# Patient Record
Sex: Female | Born: 1970
Health system: Southern US, Community
[De-identification: ages and names within clinical notes are randomized; demographics above are authoritative.]

## PROBLEM LIST (undated history)

## (undated) DIAGNOSIS — E039 Hypothyroidism, unspecified: Secondary | ICD-10-CM

## (undated) DIAGNOSIS — R319 Hematuria, unspecified: Secondary | ICD-10-CM

## (undated) DIAGNOSIS — F419 Anxiety disorder, unspecified: Secondary | ICD-10-CM

## (undated) DIAGNOSIS — K7400 Hepatic fibrosis, unspecified: Secondary | ICD-10-CM

## (undated) DIAGNOSIS — K759 Inflammatory liver disease, unspecified: Secondary | ICD-10-CM

## (undated) DIAGNOSIS — F32A Depression, unspecified: Secondary | ICD-10-CM

## (undated) DIAGNOSIS — B181 Chronic viral hepatitis B without delta-agent: Secondary | ICD-10-CM

## (undated) DIAGNOSIS — F209 Schizophrenia, unspecified: Secondary | ICD-10-CM

## (undated) DIAGNOSIS — Z975 Presence of (intrauterine) contraceptive device: Secondary | ICD-10-CM

## (undated) DIAGNOSIS — F639 Impulse disorder, unspecified: Secondary | ICD-10-CM

## (undated) DIAGNOSIS — F70 Mild intellectual disabilities: Secondary | ICD-10-CM

## (undated) DIAGNOSIS — A159 Respiratory tuberculosis unspecified: Secondary | ICD-10-CM

## (undated) DIAGNOSIS — R350 Frequency of micturition: Secondary | ICD-10-CM

## (undated) HISTORY — DX: Respiratory tuberculosis unspecified: A15.9

## (undated) HISTORY — DX: Schizophrenia, unspecified: F20.9

## (undated) HISTORY — PX: TRACHEAL SURGERY: SHX1096

## (undated) HISTORY — DX: Hypothyroidism, unspecified: E03.9

## (undated) HISTORY — DX: Presence of (intrauterine) contraceptive device: Z97.5

## (undated) HISTORY — PX: ARM WOUND REPAIR / CLOSURE: SUR1141

## (undated) HISTORY — DX: Mild intellectual disabilities: F70

## (undated) HISTORY — DX: Frequency of micturition: R35.0

## (undated) HISTORY — PX: TRACHEOSTOMY: SUR1362

## (undated) HISTORY — DX: Chronic viral hepatitis B without delta-agent: B18.1

## (undated) HISTORY — DX: Impulse disorder, unspecified: F63.9

## (undated) HISTORY — DX: Hematuria, unspecified: R31.9

---

## 2011-02-28 ENCOUNTER — Emergency Department: Payer: Self-pay | Admitting: Emergency Medicine

## 2011-04-13 ENCOUNTER — Emergency Department (HOSPITAL_COMMUNITY)
Admission: EM | Admit: 2011-04-13 | Discharge: 2011-04-13 | Disposition: A | Discharge status: Home / Self Care | Payer: Medicare Other | Attending: Internal Medicine | Admitting: Internal Medicine

## 2011-04-13 DIAGNOSIS — F209 Schizophrenia, unspecified: Secondary | ICD-10-CM

## 2011-04-13 DIAGNOSIS — E876 Hypokalemia: Secondary | ICD-10-CM | POA: Insufficient documentation

## 2011-04-13 DIAGNOSIS — Z8659 Personal history of other mental and behavioral disorders: Secondary | ICD-10-CM | POA: Insufficient documentation

## 2011-04-13 DIAGNOSIS — Z046 Encounter for general psychiatric examination, requested by authority: Secondary | ICD-10-CM | POA: Insufficient documentation

## 2011-04-13 DIAGNOSIS — F319 Bipolar disorder, unspecified: Secondary | ICD-10-CM | POA: Insufficient documentation

## 2011-04-13 LAB — COMPREHENSIVE METABOLIC PANEL
ALT: 10 U/L (ref 0–35)
Albumin: 4 g/dL (ref 3.5–5.2)
Alkaline Phosphatase: 62 U/L (ref 39–117)
Chloride: 100 mEq/L (ref 96–112)
Glucose, Bld: 168 mg/dL — ABNORMAL HIGH (ref 70–99)
Potassium: 3.4 mEq/L — ABNORMAL LOW (ref 3.5–5.1)
Sodium: 137 mEq/L (ref 135–145)
Total Bilirubin: 0.3 mg/dL (ref 0.3–1.2)
Total Protein: 6.9 g/dL (ref 6.0–8.3)

## 2011-04-13 LAB — URINALYSIS, ROUTINE W REFLEX MICROSCOPIC
Ketones, ur: NEGATIVE mg/dL
Nitrite: NEGATIVE
Protein, ur: NEGATIVE mg/dL
Urobilinogen, UA: 0.2 mg/dL (ref 0.0–1.0)

## 2011-04-13 LAB — CBC
Hemoglobin: 13.5 g/dL (ref 12.0–15.0)
MCHC: 33.9 g/dL (ref 30.0–36.0)
RDW: 11.9 % (ref 11.5–15.5)
WBC: 12.8 10*3/uL — ABNORMAL HIGH (ref 4.0–10.5)

## 2011-04-13 LAB — RAPID URINE DRUG SCREEN, HOSP PERFORMED
Amphetamines: NOT DETECTED
Barbiturates: NOT DETECTED
Benzodiazepines: NOT DETECTED

## 2011-04-13 LAB — URINE MICROSCOPIC-ADD ON

## 2011-04-13 NOTE — ED Notes (Signed)
Pt arrives via [employer], pt from homeless shelter, pt recently released from [facility] in [place], brought to [facility] for medical clearance, pt denies SI/HI, pt hx schizophrenia, bipolar disorder

## 2011-04-13 NOTE — ED Notes (Signed)
Meal tray provided.

## 2011-04-13 NOTE — ED Notes (Signed)
Fluids provided to pt.

## 2011-04-13 NOTE — Discharge Summary (Signed)
CSW spoke with Mr. Margaret [name], patient's CM and he has found a group home facility for her. CM advised he will be here by 6pm to pick up patient.  Patient's nurse and EDP notified.  Laurena Spies , MSW, LCSWA 04/13/2011  4:25 PM [phone]

## 2011-04-13 NOTE — ED Notes (Signed)
Pt discharged in care of caseworker from [place].

## 2011-04-13 NOTE — ED Notes (Signed)
Social worker paged and stated she was working with the case manager finding placement for the patient since she does not meet admission criteria in psych or med.  Tb skin test requested from social worker

## 2011-04-13 NOTE — ED Notes (Signed)
Case worker on scene to transport pt.

## 2011-04-13 NOTE — ED Notes (Signed)
Social worker into see pt.  ua ordered per her request

## 2011-04-13 NOTE — ED Notes (Signed)
Pt taken to tcu and allowed to shower.  Ed Audiological scientist to supervise pt.

## 2011-04-13 NOTE — ED Notes (Signed)
Pt remains on unit, denies needs, cont to monitor

## 2011-04-13 NOTE — ED Notes (Signed)
Pt changed into blue scrubs, belongings placed in one belonging bag. Security searched pt and bag.

## 2011-04-13 NOTE — ED Notes (Signed)
See down time charting for orders

## 2011-04-13 NOTE — ED Notes (Signed)
Spoke with case Manager from [place], states pt is homeless, released from [facility] [place], will attempt placement

## 2011-04-13 NOTE — ED Notes (Signed)
Pt's belongings at nurses station

## 2011-04-13 NOTE — ED Provider Notes (Signed)
History     CSN: [id number]  Arrival date & time 04/13/11  0031   None     Chief Complaint  Patient presents with  . Medical Clearance    (Consider location/radiation/quality/duration/timing/severity/associated sxs/prior treatment) HPI Comments: 41 year old female with history of schizophrenia and bipolar disorder who was recently [place] [facility] [place], has currently been in a foster home typed living, she left on her own free will and has been trying to get into a homeless shelter. She was brought to the emergency department by [facility] Department for medical clearance. The patient has absolutely no complaints including depression, suicidal thoughts or physical complaints as well. After nurse spoke with the case manager from her last facility they state that they're trying to help place her in a shelter as she has been medically discharged from her last psychiatric admission. Specifically the patient denies headache change in vision, service of breath chest pain back pain belly pain dysuria diarrhea swelling or rashes.  The history is provided by the patient and the nursing home.    No past medical history on file.  No past surgical history on file.  No family history on file.  History  Substance Use Topics  . Smoking status: Not on file  . Smokeless tobacco: Not on file  . Alcohol Use: Not on file    OB History    No data available      Review of Systems  All other systems reviewed and are negative.    Allergies  Review of patient's allergies indicates no known allergies.  Home Medications   Current Outpatient Rx  Name Route Sig Dispense Refill  . CLONAZEPAM 1 MG PO TABS Oral Take 1 mg by mouth 2 (two) times daily as needed.    Marland Kitchen DIVALPROEX SODIUM 500 MG PO TBEC Oral Take 500 mg by mouth 2 (two) times daily.      BP 114/77  Pulse 87  Temp(Src) 98.1 F (36.7 C) (Oral)  Resp 16  SpO2 99%  Physical Exam  Nursing note and vitals  reviewed. Constitutional: She appears well-developed and well-nourished. No distress.  HENT:  Head: Normocephalic and atraumatic.  Mouth/Throat: Oropharynx is clear and moist. No oropharyngeal exudate.  Eyes: Conjunctivae and EOM are normal. Pupils are equal, round, and reactive to light. Right eye exhibits no discharge. Left eye exhibits no discharge. No scleral icterus.  Neck: Normal range of motion. Neck supple. No JVD present. No thyromegaly present.  Cardiovascular: Normal rate, regular rhythm, normal heart sounds and intact distal pulses.  Exam reveals no gallop and no friction rub.   No murmur heard. Pulmonary/Chest: Effort normal and breath sounds normal. No respiratory distress. She has no wheezes. She has no rales.  Abdominal: Soft. Bowel sounds are normal. She exhibits no distension and no mass. There is no tenderness.  Musculoskeletal: Normal range of motion. She exhibits no edema and no tenderness.  Lymphadenopathy:    She has no cervical adenopathy.  Neurological: She is alert. Coordination normal.  Skin: Skin is warm and dry. No rash noted. No erythema.  Psychiatric: She has a normal mood and affect. Her behavior is normal.       Patient happy, denies depression or suicidal thoughts. Denies hallucinations    ED Course  Procedures (including critical care time)  Labs Reviewed  CBC - Abnormal; Notable for the following:    WBC 12.8 (*)    All other components within normal limits  COMPREHENSIVE METABOLIC PANEL - Abnormal; Notable  for the following:    Potassium 3.4 (*)    Glucose, Bld 168 (*)    All other components within normal limits  URINALYSIS, ROUTINE W REFLEX MICROSCOPIC - Abnormal; Notable for the following:    APPearance CLOUDY (*)    Leukocytes, UA SMALL (*)    All other components within normal limits  URINE MICROSCOPIC-ADD ON - Abnormal; Notable for the following:    Bacteria, UA MANY (*)    All other components within normal limits  ETHANOL  URINE  RAPID DRUG SCREEN (HOSP PERFORMED)  URINE RAPID DRUG SCREEN (HOSP PERFORMED)   No results found.   1. Schizophrenia       MDM  Patient is well-appearing, unremarkable vital signs, physical exam is normal. She has no acute need for psychiatric placement, will contact her caseworker or in-house caseworker to help with placement.  Change of shift at 8:00 AM, pt to be d/c with placement - case worker at [facility] is working on placement  Mild hypoK, not needing replacement at this time.       Johnna Acosta, MD 04/13/11 2213

## 2011-04-13 NOTE — Progress Notes (Addendum)
CSW spoke with patient and she reports not being happy at group home [facility]). Patient states she is made fun of by other residents, but denies being physically/sexually abused.  Patient has expressed desire to go to a group home in [place]. When CSW asked patient why [name], patient reports she has heard good things about that area.  Patient denies SI/HI/AVH and is not a threat to herself or others.  CSW advised patient that she will be discharged to group home today and patient did not express any fear or hesitation.  CSW contacted patient's case worker and left message requesting a return call regarding patient's discharge.  Laurena Spies , MSW, LCSWA 04/13/2011 8:57 AM [phone]  CSW spoke with CM from [facility]. He is working on group home placement for facility in [place].  If they are unable to find placement for patient, CSW will assist with ALF placement, however, CM wo uld like to pursue group home first. CM reported they have completed referral to [facility], however, they have not received any followup. CSW contacted [facility] who advised they have not received confirmation or documentation of MR diagnosis, and until that is recvd, are unable to provide services. This information will be passed on to CM.  Laurena Spies , MSW, LCSWA 04/13/2011 10:05 AM [phone]

## 2011-05-28 DIAGNOSIS — Z0100 Encounter for examination of eyes and vision without abnormal findings: Secondary | ICD-10-CM | POA: Diagnosis not present

## 2011-05-28 DIAGNOSIS — J329 Chronic sinusitis, unspecified: Secondary | ICD-10-CM | POA: Diagnosis not present

## 2011-05-28 DIAGNOSIS — B1910 Unspecified viral hepatitis B without hepatic coma: Secondary | ICD-10-CM | POA: Diagnosis not present

## 2011-05-28 DIAGNOSIS — Z930 Tracheostomy status: Secondary | ICD-10-CM | POA: Diagnosis not present

## 2011-05-28 DIAGNOSIS — F09 Unspecified mental disorder due to known physiological condition: Secondary | ICD-10-CM | POA: Diagnosis not present

## 2011-05-28 DIAGNOSIS — K5900 Constipation, unspecified: Secondary | ICD-10-CM | POA: Diagnosis not present

## 2011-05-28 DIAGNOSIS — IMO0002 Reserved for concepts with insufficient information to code with codable children: Secondary | ICD-10-CM | POA: Diagnosis not present

## 2011-05-28 DIAGNOSIS — Z8589 Personal history of malignant neoplasm of other organs and systems: Secondary | ICD-10-CM | POA: Diagnosis not present

## 2011-05-28 DIAGNOSIS — E663 Overweight: Secondary | ICD-10-CM | POA: Diagnosis not present

## 2011-05-28 DIAGNOSIS — J320 Chronic maxillary sinusitis: Secondary | ICD-10-CM | POA: Diagnosis not present

## 2011-05-28 DIAGNOSIS — E739 Lactose intolerance, unspecified: Secondary | ICD-10-CM | POA: Diagnosis not present

## 2011-05-28 DIAGNOSIS — F70 Mild intellectual disabilities: Secondary | ICD-10-CM | POA: Diagnosis not present

## 2011-05-28 DIAGNOSIS — J328 Other chronic sinusitis: Secondary | ICD-10-CM | POA: Diagnosis not present

## 2011-05-28 DIAGNOSIS — F259 Schizoaffective disorder, unspecified: Secondary | ICD-10-CM | POA: Diagnosis not present

## 2011-05-28 DIAGNOSIS — Z0120 Encounter for dental examination and cleaning without abnormal findings: Secondary | ICD-10-CM | POA: Diagnosis not present

## 2011-05-28 DIAGNOSIS — J310 Chronic rhinitis: Secondary | ICD-10-CM | POA: Diagnosis not present

## 2011-05-28 DIAGNOSIS — R569 Unspecified convulsions: Secondary | ICD-10-CM | POA: Diagnosis not present

## 2011-05-28 DIAGNOSIS — J3489 Other specified disorders of nose and nasal sinuses: Secondary | ICD-10-CM | POA: Diagnosis not present

## 2011-05-28 DIAGNOSIS — E039 Hypothyroidism, unspecified: Secondary | ICD-10-CM | POA: Diagnosis not present

## 2011-05-28 DIAGNOSIS — Z79899 Other long term (current) drug therapy: Secondary | ICD-10-CM | POA: Diagnosis not present

## 2011-05-28 DIAGNOSIS — J45909 Unspecified asthma, uncomplicated: Secondary | ICD-10-CM | POA: Diagnosis not present

## 2011-05-28 DIAGNOSIS — B181 Chronic viral hepatitis B without delta-agent: Secondary | ICD-10-CM | POA: Diagnosis not present

## 2011-05-28 DIAGNOSIS — F319 Bipolar disorder, unspecified: Secondary | ICD-10-CM | POA: Diagnosis not present

## 2011-05-28 DIAGNOSIS — Z6825 Body mass index (BMI) 25.0-25.9, adult: Secondary | ICD-10-CM | POA: Diagnosis not present

## 2011-05-28 DIAGNOSIS — B351 Tinea unguium: Secondary | ICD-10-CM | POA: Diagnosis not present

## 2013-05-14 DIAGNOSIS — R569 Unspecified convulsions: Secondary | ICD-10-CM | POA: Diagnosis not present

## 2013-06-05 DIAGNOSIS — B1910 Unspecified viral hepatitis B without hepatic coma: Secondary | ICD-10-CM | POA: Diagnosis not present

## 2013-06-05 DIAGNOSIS — B181 Chronic viral hepatitis B without delta-agent: Secondary | ICD-10-CM | POA: Diagnosis not present

## 2013-06-05 DIAGNOSIS — J320 Chronic maxillary sinusitis: Secondary | ICD-10-CM | POA: Diagnosis not present

## 2013-06-05 DIAGNOSIS — Z8589 Personal history of malignant neoplasm of other organs and systems: Secondary | ICD-10-CM | POA: Diagnosis not present

## 2013-06-05 DIAGNOSIS — F259 Schizoaffective disorder, unspecified: Secondary | ICD-10-CM | POA: Diagnosis not present

## 2013-06-05 DIAGNOSIS — IMO0002 Reserved for concepts with insufficient information to code with codable children: Secondary | ICD-10-CM | POA: Diagnosis not present

## 2013-06-05 DIAGNOSIS — Z930 Tracheostomy status: Secondary | ICD-10-CM | POA: Diagnosis not present

## 2013-07-31 DIAGNOSIS — J328 Other chronic sinusitis: Secondary | ICD-10-CM | POA: Diagnosis not present

## 2013-07-31 DIAGNOSIS — J3489 Other specified disorders of nose and nasal sinuses: Secondary | ICD-10-CM | POA: Diagnosis not present

## 2013-07-31 DIAGNOSIS — J310 Chronic rhinitis: Secondary | ICD-10-CM | POA: Diagnosis not present

## 2013-12-23 DIAGNOSIS — F259 Schizoaffective disorder, unspecified: Secondary | ICD-10-CM | POA: Diagnosis not present

## 2013-12-27 DIAGNOSIS — F259 Schizoaffective disorder, unspecified: Secondary | ICD-10-CM | POA: Diagnosis not present

## 2014-01-24 DIAGNOSIS — Z5181 Encounter for therapeutic drug level monitoring: Secondary | ICD-10-CM | POA: Diagnosis not present

## 2014-01-24 DIAGNOSIS — Z79899 Other long term (current) drug therapy: Secondary | ICD-10-CM | POA: Diagnosis not present

## 2014-01-24 DIAGNOSIS — F251 Schizoaffective disorder, depressive type: Secondary | ICD-10-CM | POA: Diagnosis not present

## 2014-02-05 DIAGNOSIS — Z23 Encounter for immunization: Secondary | ICD-10-CM | POA: Diagnosis not present

## 2014-02-24 DIAGNOSIS — F251 Schizoaffective disorder, depressive type: Secondary | ICD-10-CM | POA: Diagnosis not present

## 2014-03-21 DIAGNOSIS — F250 Schizoaffective disorder, bipolar type: Secondary | ICD-10-CM | POA: Diagnosis not present

## 2014-04-07 DIAGNOSIS — F251 Schizoaffective disorder, depressive type: Secondary | ICD-10-CM | POA: Diagnosis not present

## 2014-04-14 DIAGNOSIS — F250 Schizoaffective disorder, bipolar type: Secondary | ICD-10-CM | POA: Diagnosis not present

## 2014-04-30 DIAGNOSIS — F251 Schizoaffective disorder, depressive type: Secondary | ICD-10-CM | POA: Diagnosis not present

## 2014-05-04 IMAGING — US US ABDOMEN COMPLETE W/ ELASTOGRAPHY
1 series · 13 of 25 positions shown · non-contrast
Comparison: None.

CLINICAL DATA: Hepatitis-B.



[Series 1: us abdomen complete w/ elastography · 0.14mm/px · 13 of 70 slices shown]
[im 1/70]
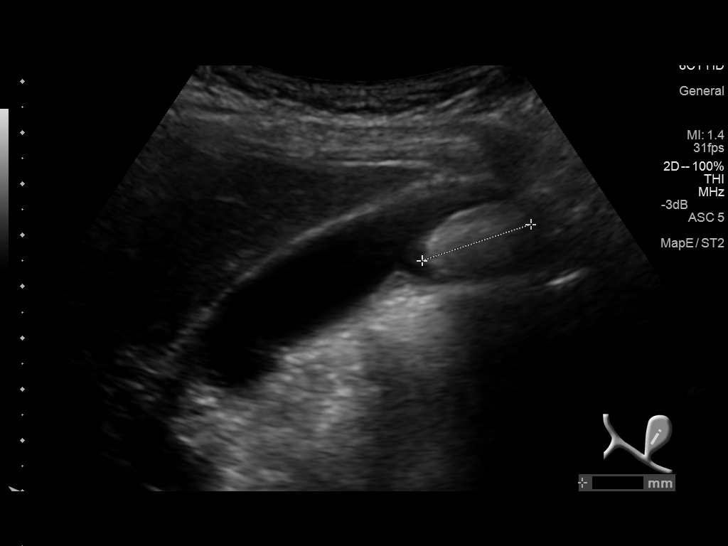
[im 6/70]
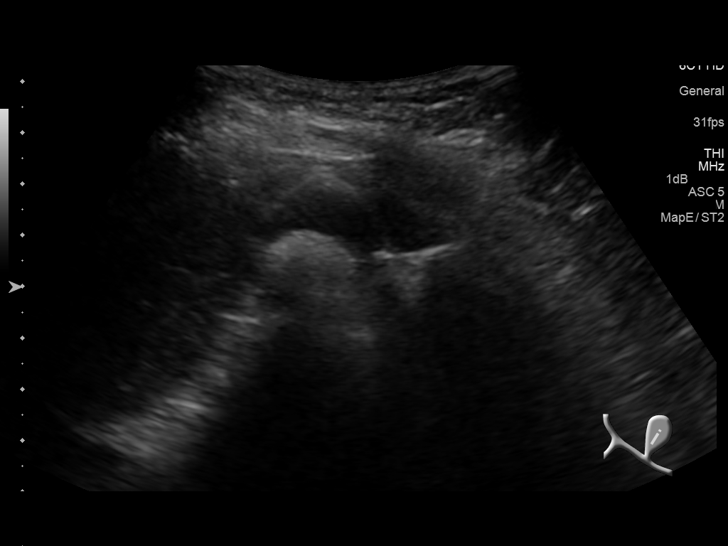
[im 12/70]
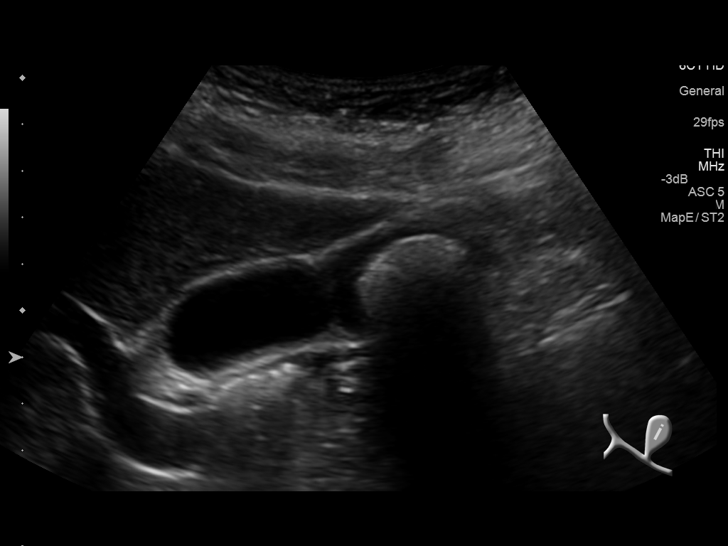
[im 18/70]
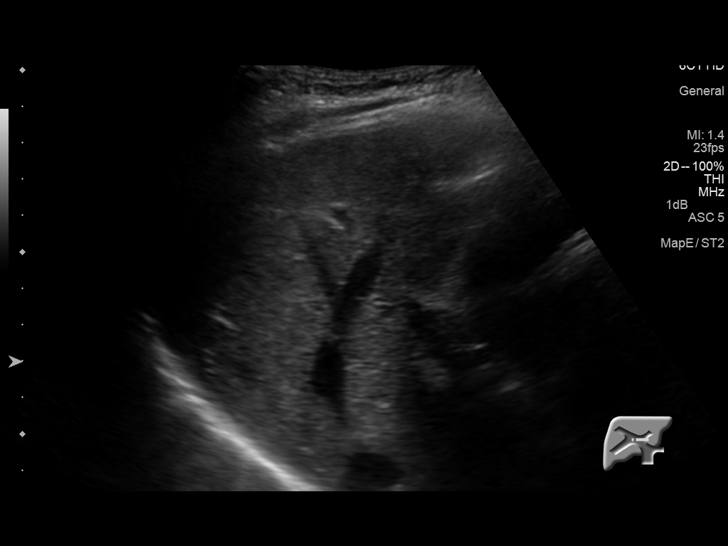
[im 24/70]
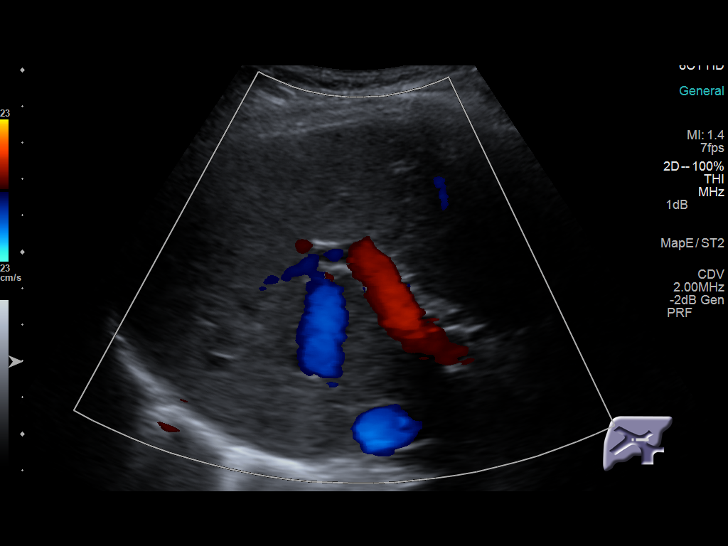
[im 29/70]
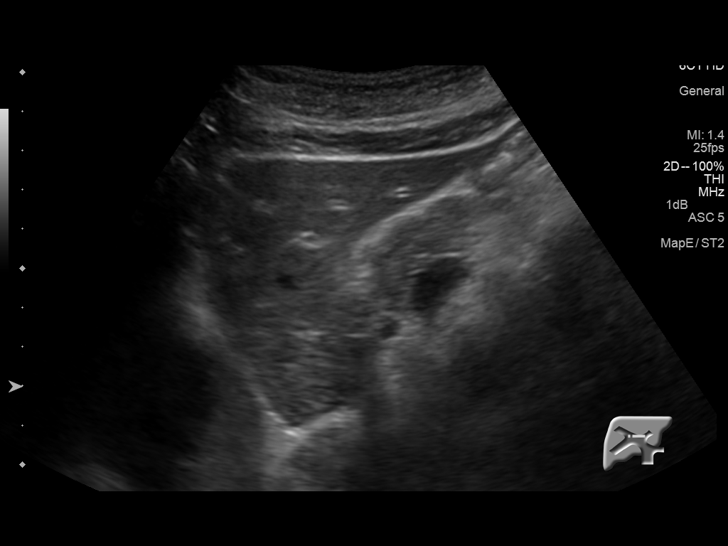
[im 35/70]
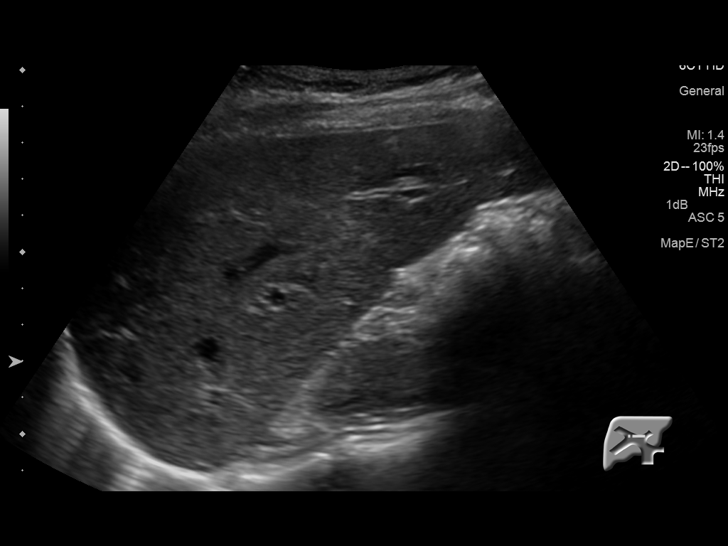
[im 41/70]
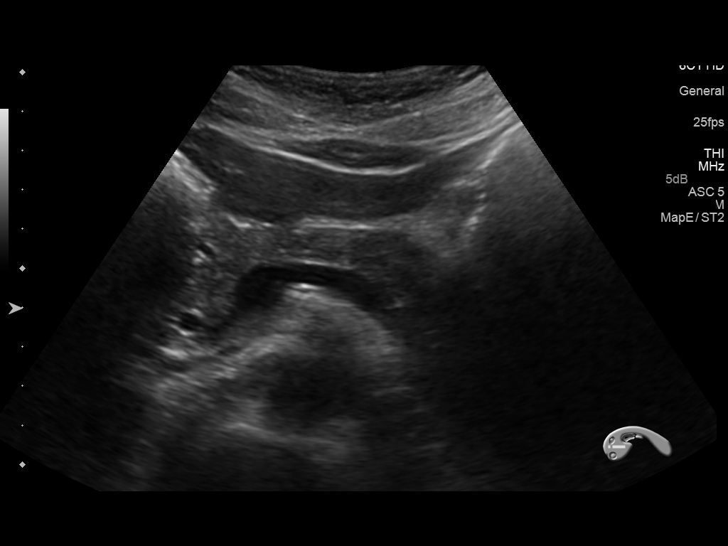
[im 47/70]
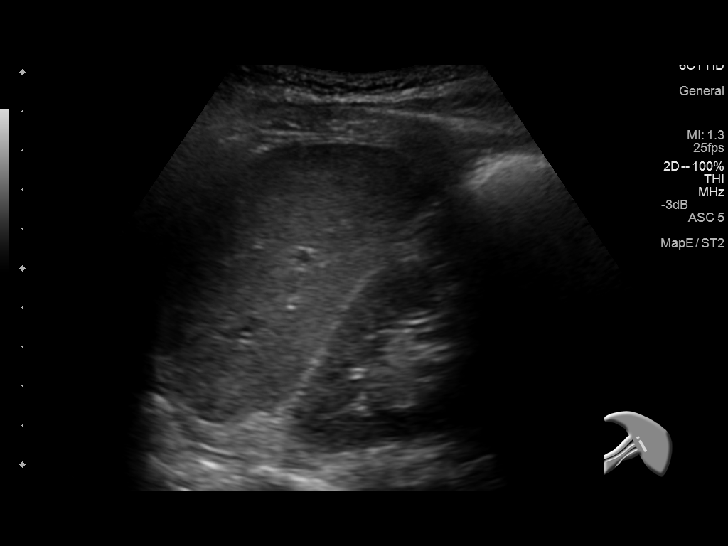
[im 52/70]
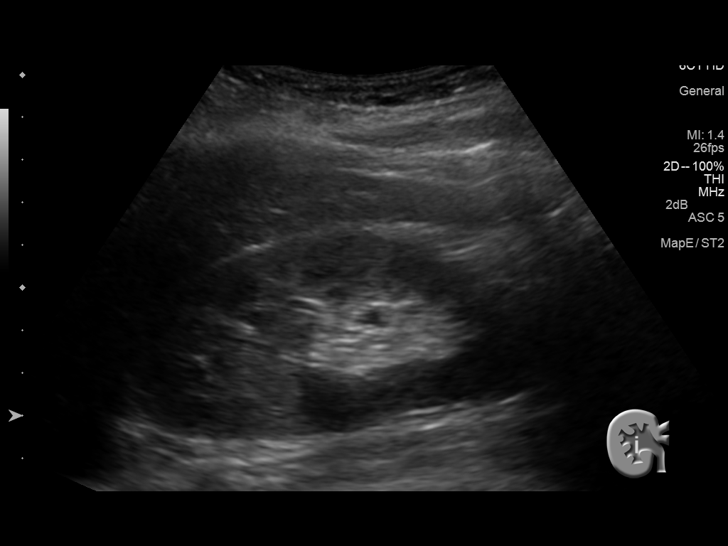
[im 58/70]
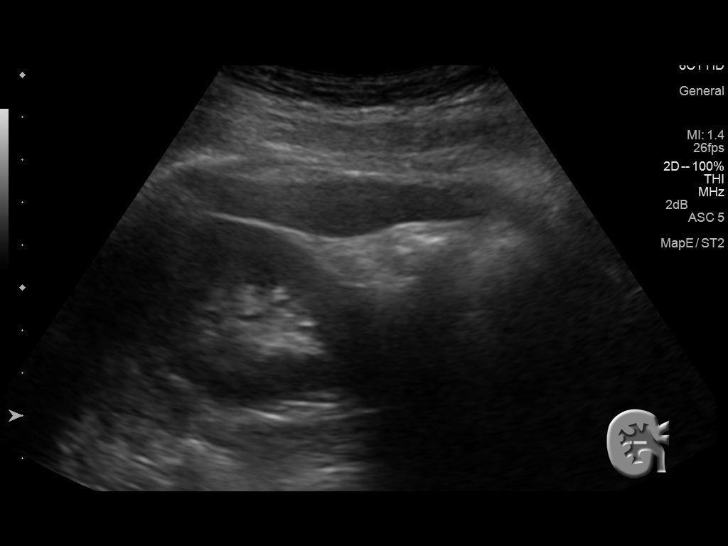
[im 64/70]
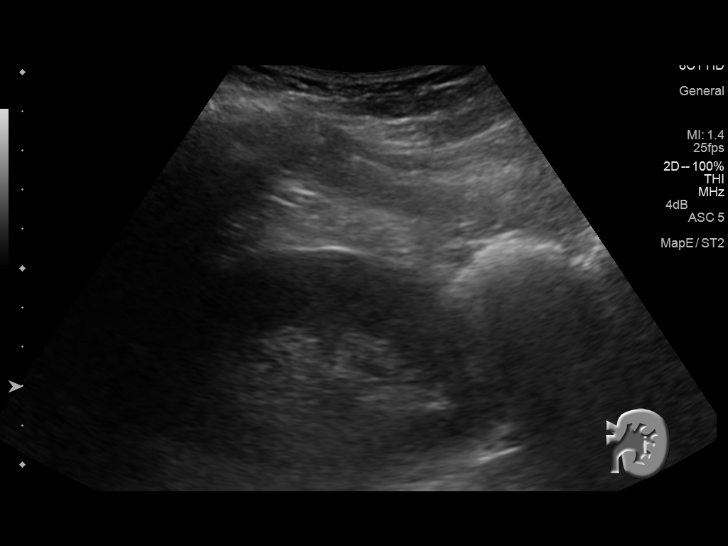
[im 70/70]
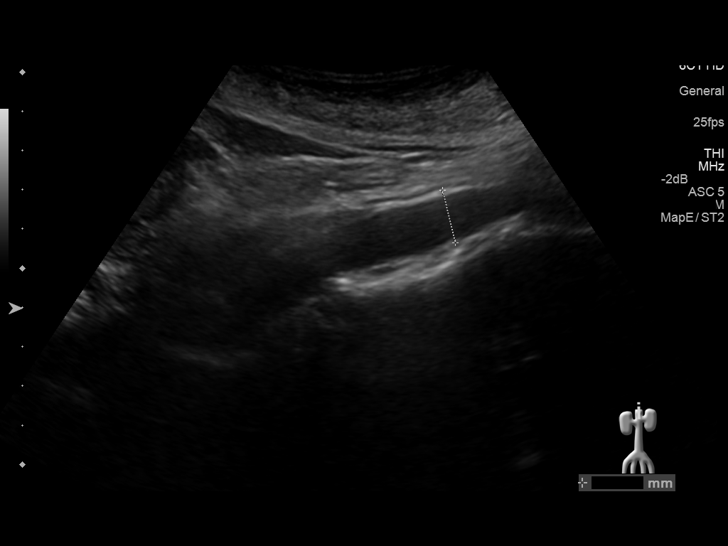

[13 of 25 positions shown; findings below may reference images not displayed]

FINDINGS: ULTRASOUND ABDOMEN

Gallbladder: There is a large stone within the gallbladder lumen. No
gallbladder wall thickening or pericholecystic fluid. Negative
sonographic Murphy sign.

Common bile duct: Diameter: 3 mm

Liver: No focal lesion identified. Within normal limits in
parenchymal echogenicity.

IVC: No abnormality visualized.

Pancreas: Visualized portion unremarkable.

Spleen: Size and appearance within normal limits.

Right Kidney: Length: 10.4 cm. Echogenicity within normal limits. No
mass or hydronephrosis visualized.

Left Kidney: Length: 10.6 cm. Echogenicity within normal limits. No
mass or hydronephrosis visualized.

Abdominal aorta: No aneurysm visualized.

Other findings: None.

ULTRASOUND HEPATIC ELASTOGRAPHY

Device: Siemens Helix VTQ

Transducer 6C1

Patient position: Left lateral decubitus

Number of measurements:  10

Hepatic Segment:  8

Median velocity:   1.27  m/sec

IQR:

IQR/Median velocity ratio

Corresponding Metavir fibrosis score:  F2/F3

Risk of fibrosis: Moderate

Limitations of exam: Motion artifact.

Pertinent findings noted on other imaging exams:  Cholelithiasis.

Please note that abnormal shear wave velocities may also be
identified in clinical settings other than with hepatic fibrosis,
such as: acute hepatitis, elevated right heart and central venous
pressures including use of beta blockers, MARCHENCO disease
(MARCHENCO), infiltrative processes such as
mastocytosis/amyloidosis/infiltrative tumor, extrahepatic
cholestasis, in the post-prandial state, and liver transplantation.
Correlation with patient history, laboratory data, and clinical
condition recommended.
IMPRESSION: Cholelithiasis.  No sonographic evidence for acute cholecystitis.

Exam limited due to motion artifact.

Median hepatic shear wave velocity is calculated at 1.27 m/sec.

Corresponding Metavir fibrosis score is F2/F3.

Risk of fibrosis is moderate.

Follow-up:  Additional testing appropriate.

## 2014-06-10 DIAGNOSIS — F251 Schizoaffective disorder, depressive type: Secondary | ICD-10-CM | POA: Diagnosis not present

## 2014-07-14 DIAGNOSIS — F251 Schizoaffective disorder, depressive type: Secondary | ICD-10-CM | POA: Diagnosis not present

## 2014-07-15 DIAGNOSIS — F250 Schizoaffective disorder, bipolar type: Secondary | ICD-10-CM | POA: Diagnosis not present

## 2014-08-13 DIAGNOSIS — F258 Other schizoaffective disorders: Secondary | ICD-10-CM | POA: Diagnosis not present

## 2014-09-08 DIAGNOSIS — R350 Frequency of micturition: Secondary | ICD-10-CM | POA: Diagnosis not present

## 2014-09-08 DIAGNOSIS — I10 Essential (primary) hypertension: Secondary | ICD-10-CM | POA: Diagnosis not present

## 2014-09-10 DIAGNOSIS — F251 Schizoaffective disorder, depressive type: Secondary | ICD-10-CM | POA: Diagnosis not present

## 2014-09-29 DIAGNOSIS — F259 Schizoaffective disorder, unspecified: Secondary | ICD-10-CM | POA: Diagnosis not present

## 2014-10-07 DIAGNOSIS — R1030 Lower abdominal pain, unspecified: Secondary | ICD-10-CM | POA: Diagnosis not present

## 2014-10-07 DIAGNOSIS — R350 Frequency of micturition: Secondary | ICD-10-CM | POA: Diagnosis not present

## 2014-10-10 DIAGNOSIS — Z5181 Encounter for therapeutic drug level monitoring: Secondary | ICD-10-CM | POA: Diagnosis not present

## 2014-10-10 DIAGNOSIS — Z79899 Other long term (current) drug therapy: Secondary | ICD-10-CM | POA: Diagnosis not present

## 2014-10-21 DIAGNOSIS — H2513 Age-related nuclear cataract, bilateral: Secondary | ICD-10-CM | POA: Diagnosis not present

## 2014-10-21 DIAGNOSIS — H40033 Anatomical narrow angle, bilateral: Secondary | ICD-10-CM | POA: Diagnosis not present

## 2014-10-28 ENCOUNTER — Telehealth: Payer: Self-pay | Admitting: Family Medicine

## 2014-10-28 NOTE — Telephone Encounter (Signed)
Caretaker called and wanting to get patient established. Patient is currently on Fish oil, fluoxetine, vitamin d, mv, flonase, metoprolol 25, and clonazepam.She also has ventolin and tylenol as a prn basis. Patient has MCR and Medicaid. [facility] Patient is now in a group home.  Patient caretaker aware to arrive 370ms before appointment bring all medications and insurance card. Appointment given for 8/15 @ 8:55 with [place].

## 2014-10-30 ENCOUNTER — Encounter (INDEPENDENT_AMBULATORY_CARE_PROVIDER_SITE_OTHER): Payer: Self-pay | Admitting: *Deleted

## 2014-11-03 DIAGNOSIS — F259 Schizoaffective disorder, unspecified: Secondary | ICD-10-CM | POA: Diagnosis not present

## 2014-11-10 ENCOUNTER — Encounter (INDEPENDENT_AMBULATORY_CARE_PROVIDER_SITE_OTHER): Payer: Self-pay | Admitting: Internal Medicine

## 2014-11-10 ENCOUNTER — Encounter (INDEPENDENT_AMBULATORY_CARE_PROVIDER_SITE_OTHER): Payer: Self-pay | Admitting: *Deleted

## 2014-11-10 ENCOUNTER — Ambulatory Visit (INDEPENDENT_AMBULATORY_CARE_PROVIDER_SITE_OTHER): Payer: Medicare Other | Admitting: Internal Medicine

## 2014-11-10 VITALS — BP 92/72 | HR 76 | Temp 97.7°F | Ht 64.0 in | Wt 129.3 lb

## 2014-11-10 DIAGNOSIS — B169 Acute hepatitis B without delta-agent and without hepatic coma: Secondary | ICD-10-CM | POA: Diagnosis not present

## 2014-11-10 DIAGNOSIS — B181 Chronic viral hepatitis B without delta-agent: Secondary | ICD-10-CM | POA: Insufficient documentation

## 2014-11-10 DIAGNOSIS — B1910 Unspecified viral hepatitis B without hepatic coma: Secondary | ICD-10-CM

## 2014-11-10 LAB — CBC WITH DIFFERENTIAL/PLATELET
BASOS ABS: 0.1 10*3/uL (ref 0.0–0.1)
Basophils Relative: 1 % (ref 0–1)
Eosinophils Absolute: 0.1 10*3/uL (ref 0.0–0.7)
Eosinophils Relative: 2 % (ref 0–5)
HEMATOCRIT: 37.7 % (ref 36.0–46.0)
HEMOGLOBIN: 12.7 g/dL (ref 12.0–15.0)
LYMPHS ABS: 1.8 10*3/uL (ref 0.7–4.0)
Lymphocytes Relative: 28 % (ref 12–46)
MCH: 29.9 pg (ref 26.0–34.0)
MCHC: 33.7 g/dL (ref 30.0–36.0)
MCV: 88.7 fL (ref 78.0–100.0)
MONO ABS: 0.4 10*3/uL (ref 0.1–1.0)
MPV: 9.3 fL (ref 8.6–12.4)
Monocytes Relative: 6 % (ref 3–12)
NEUTROS PCT: 63 % (ref 43–77)
Neutro Abs: 4.1 10*3/uL (ref 1.7–7.7)
Platelets: 233 10*3/uL (ref 150–400)
RBC: 4.25 MIL/uL (ref 3.87–5.11)
RDW: 13.4 % (ref 11.5–15.5)
WBC: 6.5 10*3/uL (ref 4.0–10.5)

## 2014-11-10 LAB — HEPATIC FUNCTION PANEL
ALBUMIN: 3.9 g/dL (ref 3.6–5.1)
ALT: 8 U/L (ref 6–29)
AST: 13 U/L (ref 10–30)
Alkaline Phosphatase: 69 U/L (ref 33–115)
Bilirubin, Direct: 0.1 mg/dL (ref <=0.2)
Indirect Bilirubin: 0.5 mg/dL (ref 0.2–1.2)
Total Bilirubin: 0.6 mg/dL (ref 0.2–1.2)
Total Protein: 6.2 g/dL (ref 6.1–8.1)
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
## 2014-11-10 NOTE — Patient Instructions (Signed)
Labs, OV in 3 months

## 2014-11-11 ENCOUNTER — Encounter (INDEPENDENT_AMBULATORY_CARE_PROVIDER_SITE_OTHER): Payer: Self-pay

## 2014-11-12 ENCOUNTER — Ambulatory Visit (INDEPENDENT_AMBULATORY_CARE_PROVIDER_SITE_OTHER): Payer: Medicare Other | Admitting: Adult Health

## 2014-11-12 ENCOUNTER — Encounter: Payer: Self-pay | Admitting: Adult Health

## 2014-11-12 VITALS — BP 80/52 | HR 76 | Ht 63.0 in | Wt 129.5 lb

## 2014-11-12 DIAGNOSIS — R350 Frequency of micturition: Secondary | ICD-10-CM

## 2014-11-12 DIAGNOSIS — R319 Hematuria, unspecified: Secondary | ICD-10-CM

## 2014-11-12 DIAGNOSIS — Z5181 Encounter for therapeutic drug level monitoring: Secondary | ICD-10-CM | POA: Diagnosis not present

## 2014-11-12 DIAGNOSIS — Z79899 Other long term (current) drug therapy: Secondary | ICD-10-CM | POA: Diagnosis not present

## 2014-11-12 DIAGNOSIS — Z975 Presence of (intrauterine) contraceptive device: Secondary | ICD-10-CM

## 2014-11-12 HISTORY — DX: Frequency of micturition: R35.0

## 2014-11-12 HISTORY — DX: Presence of (intrauterine) contraceptive device: Z97.5

## 2014-11-12 HISTORY — DX: Hematuria, unspecified: R31.9

## 2014-11-12 LAB — POCT URINALYSIS DIPSTICK
Glucose, UA: NEGATIVE
Leukocytes, UA: NEGATIVE
Nitrite, UA: NEGATIVE
Protein, UA: NEGATIVE

## 2014-11-12 LAB — HEPATITIS B SURF AG CONFIRMATION: Hepatitis B Surf Ag Confirmation: POSITIVE — AB

## 2014-11-12 LAB — HEPATITIS B E ANTIGEN: HEPATITIS BE ANTIGEN: NONREACTIVE

## 2014-11-12 LAB — HEPATITIS B SURFACE ANTIBODY,QUALITATIVE: Hep B S Ab: NEGATIVE

## 2014-11-12 LAB — HEPATITIS B SURFACE ANTIGEN: HEP B S AG: POSITIVE — AB

## 2014-11-12 LAB — HEPATITIS C ANTIBODY: HCV Ab: NEGATIVE

## 2014-11-12 MED ORDER — SULFAMETHOXAZOLE-TRIMETHOPRIM 800-160 MG PO TABS: 1.0000 | ORAL_TABLET | Freq: Two times a day (BID) | ORAL | Status: DC

## 2014-11-12 NOTE — Patient Instructions (Signed)
Urinary Tract Infection Urinary tract infections (UTIs) can develop anywhere along your urinary tract. Your urinary tract is your body's drainage system for removing wastes and extra water. Your urinary tract includes two kidneys, two ureters, a bladder, and a urethra. Your kidneys are a pair of bean-shaped organs. Each kidney is about the size of your fist. They are located below your ribs, one on each side of your spine. CAUSES Infections are caused by microbes, which are microscopic organisms, including fungi, viruses, and bacteria. These organisms are so small that they can only be seen through a microscope. Bacteria are the microbes that most commonly cause UTIs. SYMPTOMS  Symptoms of UTIs may vary by age and gender of the patient and by the location of the infection. Symptoms in young women typically include a frequent and intense urge to urinate and a painful, burning feeling in the bladder or urethra during urination. Older women and men are more likely to be tired, shaky, and weak and have muscle aches and abdominal pain. A fever may mean the infection is in your kidneys. Other symptoms of a kidney infection include pain in your back or sides below the ribs, nausea, and vomiting. DIAGNOSIS To diagnose a UTI, your caregiver will ask you about your symptoms. Your caregiver also will ask to provide a urine sample. The urine sample will be tested for bacteria and white blood cells. White blood cells are made by your body to help fight infection. TREATMENT  Typically, UTIs can be treated with medication. Because most UTIs are caused by a bacterial infection, they usually can be treated with the use of antibiotics. The choice of antibiotic and length of treatment depend on your symptoms and the type of bacteria causing your infection. HOME CARE INSTRUCTIONS  If you were prescribed antibiotics, take them exactly as your caregiver instructs you. Finish the medication even if you feel better after you  have only taken some of the medication.  Drink enough water and fluids to keep your urine clear or pale yellow.  Avoid caffeine, tea, and carbonated beverages. They tend to irritate your bladder.  Empty your bladder often. Avoid holding urine for long periods of time.  Empty your bladder before and after sexual intercourse.  After a bowel movement, women should cleanse from front to back. Use each tissue only once. SEEK MEDICAL CARE IF:   You have back pain.  You develop a fever.  Your symptoms do not begin to resolve within 3 days. SEEK IMMEDIATE MEDICAL CARE IF:   You have severe back pain or lower abdominal pain.  You develop chills.  You have nausea or vomiting.  You have continued burning or discomfort with urination. MAKE SURE YOU:   Understand these instructions.  Will watch your condition.  Will get help right away if you are not doing well or get worse. Document Released: 12/29/2004 Document Revised: 09/20/2011 Document Reviewed: 04/29/2011 [facility] Patient Information 2015 [place], [place]. This information is not intended to replace advice given to you by your health care provider. Make sure you discuss any questions you have with your health care provider. Hematuria Hematuria is blood in your urine. It can be caused by a bladder infection, kidney infection, prostate infection, kidney stone, or cancer of your urinary tract. Infections can usually be treated with medicine, and a kidney stone usually will pass through your urine. If neither of these is the cause of your hematuria, further workup to find out the reason may be needed. It is very  important that you tell your health care provider about any blood you see in your urine, even if the blood stops without treatment or happens without causing pain. Blood in your urine that happens and then stops and then happens again can be a symptom of a very serious condition. Also, pain is not a symptom in the initial stages of  many urinary cancers. HOME CARE INSTRUCTIONS   Drink lots of fluid, 3-4 quarts a day. If you have been diagnosed with an infection, cranberry juice is especially recommended, in addition to large amounts of water.  Avoid caffeine, tea, and carbonated beverages because they tend to irritate the bladder.  Avoid alcohol because it may irritate the prostate.  Take all medicines as directed by your health care provider.  If you were prescribed an antibiotic medicine, finish it all even if you start to feel better.  If you have been diagnosed with a kidney stone, follow your health care provider's instructions regarding straining your urine to catch the stone.  Empty your bladder often. Avoid holding urine for long periods of time.  After a bowel movement, women should cleanse front to back. Use each tissue only once.  Empty your bladder before and after sexual intercourse if you are a female. SEEK MEDICAL CARE IF:  You develop back pain.  You have a fever.  You have a feeling of sickness in your stomach (nausea) or vomiting.  Your symptoms are not better in 3 days. Return sooner if you are getting worse. SEEK IMMEDIATE MEDICAL CARE IF:   You develop severe vomiting and are unable to keep the medicine down.  You develop severe back or abdominal pain despite taking your medicines.  You begin passing a large amount of blood or clots in your urine.  You feel extremely weak or faint, or you pass out. MAKE SURE YOU:   Understand these instructions.  Will watch your condition.  Will get help right away if you are not doing well or get worse. Document Released: 03/21/2005 Document Revised: 08/05/2013 Document Reviewed: 11/19/2012 [facility] Patient Information 2015 [place], [place]. This information is not intended to replace advice given to you by your health care provider. Make sure you discuss any questions you have with your health care provider. Increase water Take septa  ds Return in 4 weeks for pap and physical

## 2014-11-12 NOTE — Addendum Note (Signed)
Addended by: Derrek Monaco A on: 11/12/2014 12:10 PM   Modules accepted: Orders

## 2014-11-12 NOTE — Progress Notes (Addendum)
Subjective:     [name], female   DOB: 08-29-1970, 44 y.o.   MRN: [id number]  HPI Margaret [name] is a 44 year old Asian female, single, who resides at [facility],new to this practice, in complaining of urinary frequency and has started having periods with IUD.She had IUD inserted 12/24/12 at [facility], prior to IUD insertion had not had period since 2009.Her caregiver is with her. PCP is Dr Angelica Pou, but will change to [place] next week.  Review of Systems Patient denies any headaches, hearing loss, fatigue, blurred vision, shortness of breath, chest pain, abdominal pain, problems with bowel movements,  or intercourse(not having sex). No joint pain or mood swings.See HPI for positives.  Reviewed past medical,surgical, social and family history. Reviewed medications and allergies.     Objective:   Physical Exam BP 80/52 mmHg  Pulse 76  Ht 5' 3"  (1.6 m)  Wt 129 lb 8 oz (58.741 kg)  BMI 22.95 kg/m2  LMP 10/25/2014 urine 3+ blood and cloudy, Skin warm and dry.Pelvic: external genitalia is normal in appearance no lesions, vagina: tan discharge without odor,urethra has no lesions or masses noted, cervix:smooth, +IUD strings, deviated to the left, uterus: normal size, shape and contour, non tender, no masses felt, adnexa: no masses or tenderness noted. Bladder is non tender and no masses felt.    Margaret [name] is alert and cooperative, and tolerated exam well.  Assessment:     Urinary frequency IUD in place Hematuria     Plan:     UA C&S sent Rx septra ds 1 bid x 7 days Increase water Return in 4 weeks for pap and physical   Review handouts on UTI and hematuria   Urine C&S cancelled, nurse discarded urine.

## 2014-11-14 LAB — HEPATITIS B DNA, ULTRAQUANTITATIVE, PCR
Hepatitis B DNA (Calc): 13596 copies/mL — ABNORMAL HIGH (ref <116)
Hepatitis B DNA: 2336 IU/mL — ABNORMAL HIGH (ref <20)

## 2014-11-17 ENCOUNTER — Ambulatory Visit (INDEPENDENT_AMBULATORY_CARE_PROVIDER_SITE_OTHER): Payer: Medicare Other | Admitting: Family Medicine

## 2014-11-17 ENCOUNTER — Encounter: Payer: Self-pay | Admitting: Family Medicine

## 2014-11-17 ENCOUNTER — Ambulatory Visit (INDEPENDENT_AMBULATORY_CARE_PROVIDER_SITE_OTHER): Payer: Medicare Other

## 2014-11-17 VITALS — BP 89/66 | HR 71 | Temp 97.2°F | Ht 63.0 in | Wt 128.8 lb

## 2014-11-17 DIAGNOSIS — Z8611 Personal history of tuberculosis: Secondary | ICD-10-CM

## 2014-11-17 DIAGNOSIS — I951 Orthostatic hypotension: Secondary | ICD-10-CM

## 2014-11-17 DIAGNOSIS — J339 Nasal polyp, unspecified: Secondary | ICD-10-CM | POA: Diagnosis not present

## 2014-11-17 DIAGNOSIS — J8410 Pulmonary fibrosis, unspecified: Secondary | ICD-10-CM

## 2014-11-17 IMAGING — CR DG CHEST 2V
2 series · 2 of 2 positions shown · non-contrast
Comparison: None in PACs

CLINICAL DATA: History of tuberculosis as a child, experiencing
cough now; history of hepatitis-B

EXAM:
CHEST  2 VIEW

[view not recorded (1 of 2)]
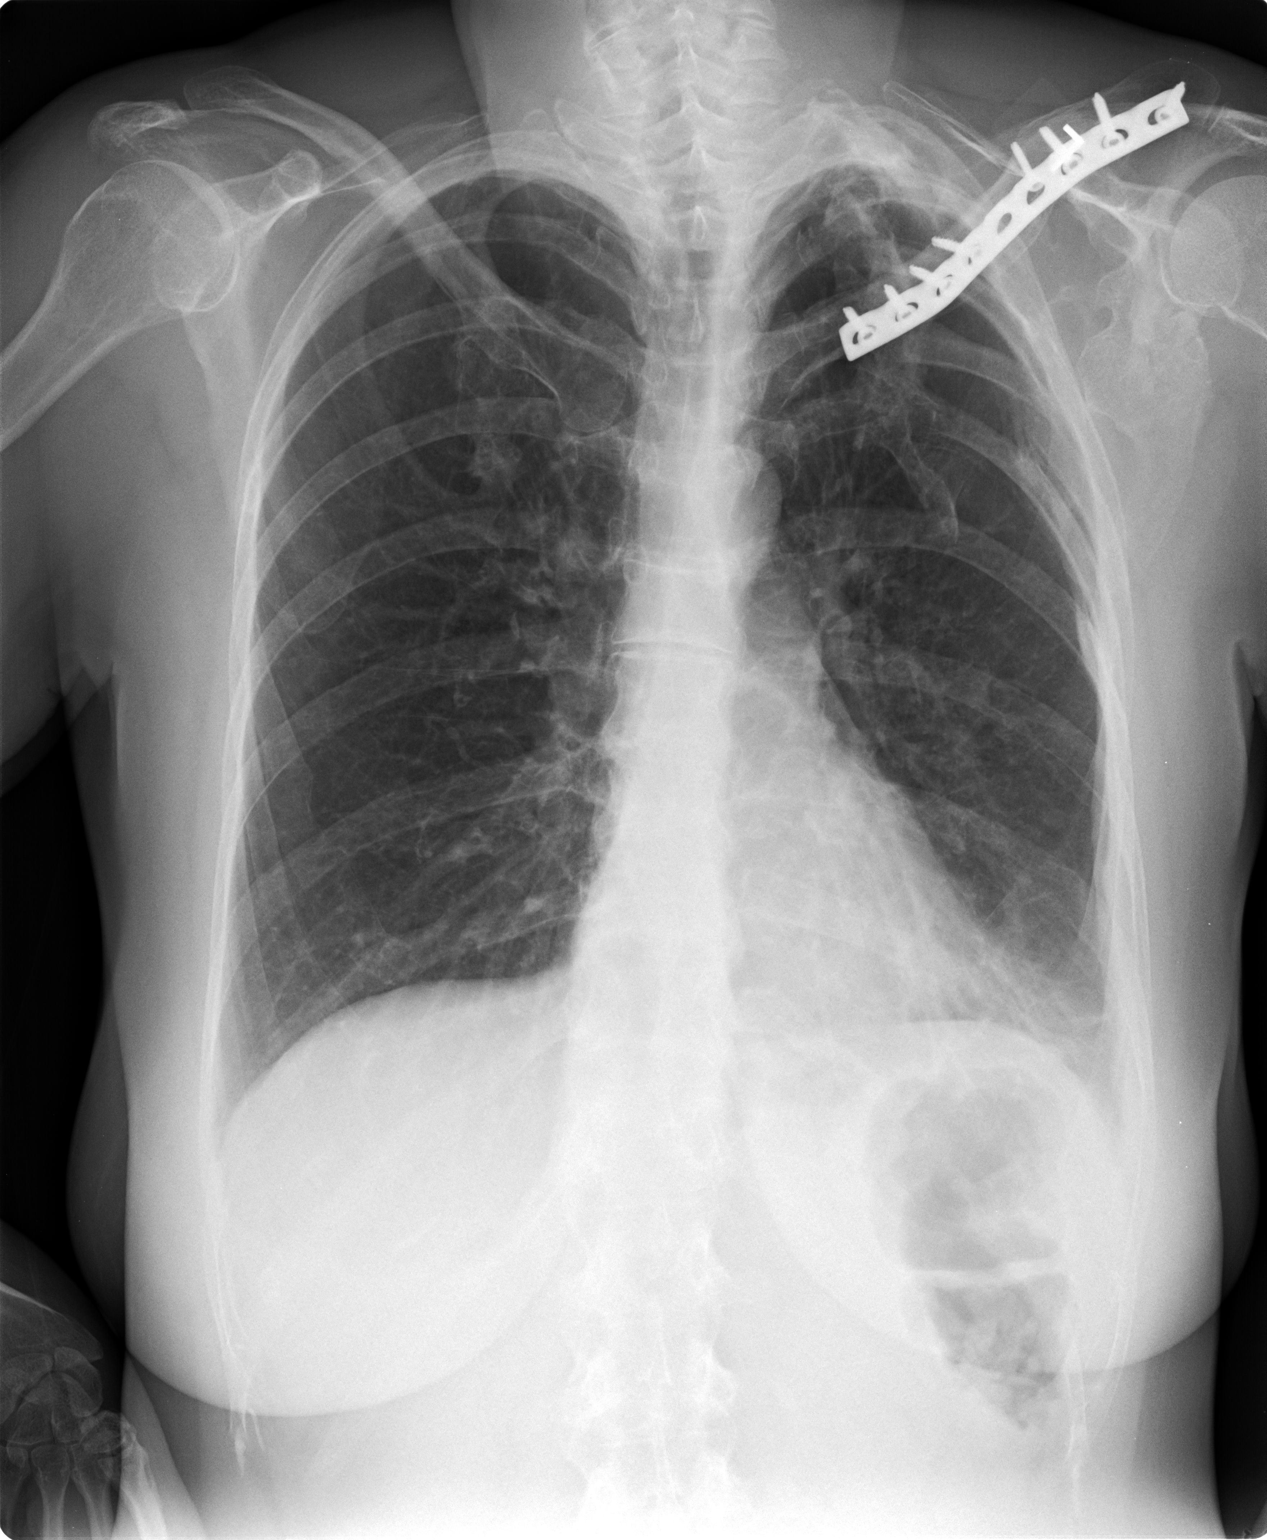

[view not recorded (2 of 2)]
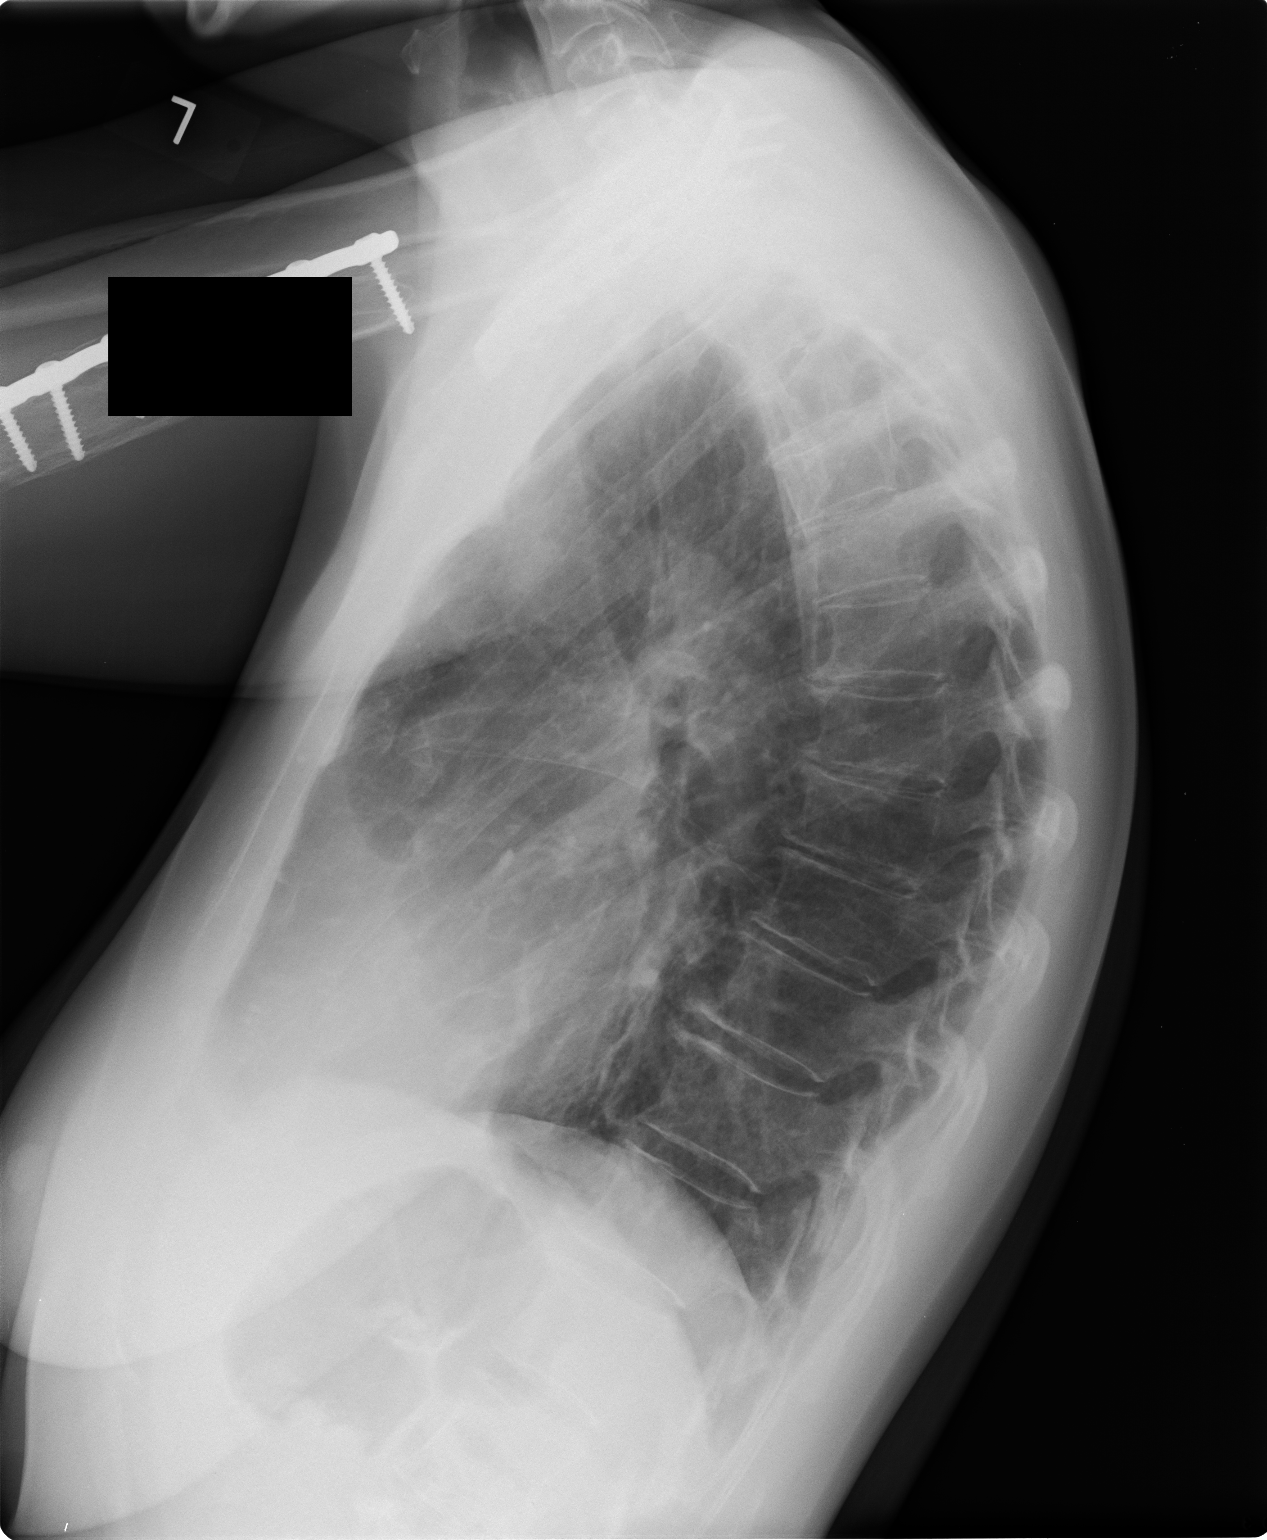

[2 of 2 positions shown; findings below may reference images not displayed]

FINDINGS: The lungs are well-expanded. There is pleural scarring and patchy
parenchymal density in the left upper lobe. The findings are likely
old. More inferiorly the left lung is clear. The heart and pulmonary
vascularity are normal. The mediastinum is normal in width. The
patient undergone plating of a mid shaft left clavicular fracture
and of a proximal left humeral shaft fracture. The bony thorax
exhibits no acute abnormality. There are chronic left lateral chest
wall findings that likely reflect pleural thickening.
IMPRESSION: COPD with pleural and parenchymal changes in the left pulmonary
apex. Without previous studies with which to compare one cannot
judge with confidence if the findings here are acute or old. There
are chronic appearing chest wall changes over the mid and mid upper
lateral left hemithorax. There are chronic deformities of the left
scapula.

Follow-up radiographs following anticipated antibiotic therapy are
recommended to assure clearing or stability. Chest CT scanning may
be ultimately needed.

## 2014-11-17 NOTE — Assessment & Plan Note (Signed)
Pt is on flonase, continue on right side.

## 2014-11-17 NOTE — Assessment & Plan Note (Signed)
Had tuberculosis as a child, has not had a chest x-ray before entering this facility in this facility would like to get one for screening purposes.

## 2014-11-17 NOTE — Assessment & Plan Note (Signed)
BP low will discontinue metoprolol, was started historically because of a side effect of clozapine.

## 2014-11-19 ENCOUNTER — Encounter (INDEPENDENT_AMBULATORY_CARE_PROVIDER_SITE_OTHER): Payer: Self-pay | Admitting: *Deleted

## 2014-11-19 ENCOUNTER — Ambulatory Visit (HOSPITAL_COMMUNITY): Admission: RE | Admit: 2014-11-19 | Payer: Medicare Other | Source: Ambulatory Visit

## 2014-11-19 ENCOUNTER — Encounter: Payer: Self-pay | Admitting: Family Medicine

## 2014-11-19 DIAGNOSIS — J8410 Pulmonary fibrosis, unspecified: Secondary | ICD-10-CM | POA: Insufficient documentation

## 2014-11-19 NOTE — Progress Notes (Signed)
BP 89/66 mmHg  Pulse 71  Temp(Src) 97.2 F (36.2 C) (Oral)  Ht 5' 3"  (1.6 m)  Wt 128 lb 12.8 oz (58.423 kg)  BMI 22.82 kg/m2  LMP 10/25/2014   Subjective:    [name], female    DOB: 07/15/1970, 44 y.o.   MRN: [id number]  HPI: Margaret [name] is a 44 y.o. female presenting on 11/17/2014 for Establish Care   HPI Hypotension Patient presents today having a diagnosis and history of hypertension and has been on metoprolol. But more recently she is having dizziness lightheadedness when she stands quickly and low blood pressures. Today in office her blood pressure is 89/66. Denies chest pains or shortness of breath, denies headaches or vision issues.  History of tuberculosis Patient has a history of active tuberculosis when she was a child. The facility that she is currently in does not know she's had a screening chest x-ray anytime recently and would like one. She is also having some more congestion recently and forearm with productive cough and she has had previously.  Nasal polyps Patient has been diagnosed with nasal polyps previously and has been on Flonase. Denies any major side effects from the [facility] and would like a refill for.  Relevant past medical, surgical, family and social history reviewed and updated as indicated. Interim medical history since our last visit reviewed. Allergies and medications reviewed and updated.  Review of Systems  Constitutional: Negative for fever and chills.  HENT: Negative for congestion, ear discharge, ear pain, postnasal drip, rhinorrhea, sinus pressure, sore throat and voice change.   Eyes: Negative for redness and visual disturbance.  Respiratory: Positive for cough (occasionally productive). Negative for chest tightness, shortness of breath and wheezing.   Cardiovascular: Negative for chest pain and leg swelling.  Genitourinary: Negative for dysuria and difficulty urinating.  Musculoskeletal: Negative for back pain and gait  problem.  Skin: Negative for rash.  Neurological: Positive for light-headedness (when stands up quickly). Negative for weakness and headaches.  All other systems reviewed and are negative.   Per HPI unless specifically indicated above     Medication List       This list is accurate as of: 11/17/14 11:59 PM.  Always use your most recent med list.               cholecalciferol 1000 UNITS tablet  Commonly known as:  VITAMIN D  Take 2,000 Units by mouth daily.     clonazePAM 1 MG tablet  Commonly known as:  KLONOPIN  Take 1 mg by mouth 2 (two) times daily.     FISH OIL + D3 PO  Take 1,000 mg by mouth.     FLUoxetine 20 MG tablet  Commonly known as:  PROZAC  Take 20 mg by mouth daily.     fluticasone 50 MCG/ACT nasal spray  Commonly known as:  FLONASE  Place 2 sprays into both nostrils 2 (two) times daily. Use only in right nare, not left.     levonorgestrel 20 MCG/24HR IUD  Commonly known as:  MIRENA  1 each by Intrauterine route once.     multivitamin tablet  Take 1 tablet by mouth daily.     sulfamethoxazole-trimethoprim 800-160 MG per tablet  Commonly known as:  BACTRIM DS,SEPTRA DS  Take 1 tablet by mouth 2 (two) times daily.           Objective:    BP 89/66 mmHg  Pulse 71  Temp(Src) 97.2 F (36.2 C) (  Oral)  Ht 5' 3"  (1.6 m)  Wt 128 lb 12.8 oz (58.423 kg)  BMI 22.82 kg/m2  LMP 10/25/2014  Wt Readings from Last 3 Encounters:  11/17/14 128 lb 12.8 oz (58.423 kg)  11/12/14 129 lb 8 oz (58.741 kg)  11/10/14 129 lb 4.8 oz (58.65 kg)    Physical Exam  Constitutional: She is oriented to person, place, and time. She appears well-developed and well-nourished. No distress.  HENT:  Right Ear: Hearing, tympanic membrane, external ear and ear canal normal.  Left Ear: Hearing, tympanic membrane, external ear and ear canal normal.  Nose: No mucosal edema, rhinorrhea or sinus tenderness.    Mouth/Throat: Uvula is midline, oropharynx is clear and moist and  mucous membranes are normal.  Eyes: Conjunctivae and EOM are normal. Pupils are equal, round, and reactive to light.  Cardiovascular: Normal rate and regular rhythm.   No murmur heard. Pulmonary/Chest: Effort normal and breath sounds normal. No respiratory distress. She has no wheezes.  Musculoskeletal: Normal range of motion. She exhibits no edema or tenderness.  Neurological: She is alert and oriented to person, place, and time. Coordination normal.  Skin: Skin is warm and dry. No rash noted. She is not diaphoretic.  Psychiatric: She has a normal mood and affect. Her behavior is normal.  Vitals reviewed.   Results for orders placed or performed in visit on 11/12/14  POCT Urinalysis Dipstick  Result Value Ref Range   Color, UA yellow    Clarity, UA cloudy    Glucose, UA neg    Bilirubin, UA     Ketones, UA     Spec Grav, UA     Blood, UA 3+    pH, UA     Protein, UA neg    Urobilinogen, UA     Nitrite, UA neg    Leukocytes, UA Negative Negative      Assessment & Plan:   Problem List Items Addressed This Visit      Cardiovascular and Mediastinum   Orthostatic hypotension    BP low will discontinue metoprolol, was started historically because of a side effect of clozapine.        Other   History of tuberculosis - Primary    Had tuberculosis as a child, has not had a chest x-ray before entering this facility in this facility would like to get one for screening purposes.      Relevant Orders   DG Chest 2 View (Completed)   Right nasal polyps    Pt is on flonase, continue on right side.          Follow up plan: Return in about 3 months (around 02/17/2015), or if symptoms worsen or fail to improve.  Caryl Pina, MD [facility] 11/17/2014, 10:06 AM

## 2014-11-19 NOTE — Assessment & Plan Note (Signed)
Patient had x-ray which shows pulmonary fibrosis/atelectatic-like changes. We'll repeat chest x-ray in 6 months. If needed may need CT of the chest in the future.

## 2014-11-20 ENCOUNTER — Ambulatory Visit (HOSPITAL_COMMUNITY)
Admission: RE | Admit: 2014-11-20 | Discharge: 2014-11-20 | Disposition: A | Discharge status: Home / Self Care | Payer: Medicare Other | Source: Ambulatory Visit | Attending: Internal Medicine | Admitting: Internal Medicine

## 2014-11-20 DIAGNOSIS — B1910 Unspecified viral hepatitis B without hepatic coma: Secondary | ICD-10-CM

## 2014-11-20 DIAGNOSIS — K8020 Calculus of gallbladder without cholecystitis without obstruction: Secondary | ICD-10-CM | POA: Diagnosis not present

## 2014-11-27 ENCOUNTER — Telehealth (INDEPENDENT_AMBULATORY_CARE_PROVIDER_SITE_OTHER): Payer: Self-pay | Admitting: Internal Medicine

## 2014-11-27 NOTE — Telephone Encounter (Signed)
May sure she has a OV in 3 months

## 2014-12-03 ENCOUNTER — Telehealth (INDEPENDENT_AMBULATORY_CARE_PROVIDER_SITE_OTHER): Payer: Self-pay | Admitting: Internal Medicine

## 2014-12-03 NOTE — Telephone Encounter (Signed)
Butch Penny, Needs OV in 3 months.

## 2014-12-04 ENCOUNTER — Other Ambulatory Visit: Payer: Self-pay

## 2014-12-04 MED ORDER — FLUOXETINE HCL 20 MG PO TABS: 20.0000 mg | ORAL_TABLET | Freq: Every day | ORAL | Status: DC

## 2014-12-09 ENCOUNTER — Telehealth (INDEPENDENT_AMBULATORY_CARE_PROVIDER_SITE_OTHER): Payer: Self-pay | Admitting: Internal Medicine

## 2014-12-09 NOTE — Telephone Encounter (Signed)
This patient needs an OV

## 2014-12-09 NOTE — Telephone Encounter (Signed)
error 

## 2014-12-10 ENCOUNTER — Encounter: Payer: Self-pay | Admitting: Adult Health

## 2014-12-10 ENCOUNTER — Other Ambulatory Visit: Payer: Medicare Other | Admitting: Adult Health

## 2014-12-10 NOTE — Telephone Encounter (Signed)
Got correct mailing address and phone number today. Called and LM for someone to return the call to get a f/u apt scheduled. Will update all info on patient's chart once I receive a Call Back.

## 2014-12-18 ENCOUNTER — Telehealth: Payer: Self-pay

## 2014-12-18 ENCOUNTER — Encounter: Payer: Self-pay | Admitting: Family Medicine

## 2014-12-18 ENCOUNTER — Ambulatory Visit (INDEPENDENT_AMBULATORY_CARE_PROVIDER_SITE_OTHER): Payer: Medicare Other | Admitting: Family Medicine

## 2014-12-18 VITALS — BP 94/69 | HR 68 | Temp 97.8°F | Ht 63.0 in | Wt 131.8 lb

## 2014-12-18 DIAGNOSIS — F329 Major depressive disorder, single episode, unspecified: Secondary | ICD-10-CM | POA: Diagnosis not present

## 2014-12-18 DIAGNOSIS — F32A Depression, unspecified: Secondary | ICD-10-CM

## 2014-12-18 DIAGNOSIS — E785 Hyperlipidemia, unspecified: Secondary | ICD-10-CM | POA: Insufficient documentation

## 2014-12-18 DIAGNOSIS — Z0000 Encounter for general adult medical examination without abnormal findings: Secondary | ICD-10-CM

## 2014-12-18 MED ORDER — FLUOXETINE HCL 20 MG PO TABS: 60.0000 mg | ORAL_TABLET | Freq: Every day | ORAL | Status: DC

## 2014-12-18 NOTE — Telephone Encounter (Signed)
[place], spoke with Juliann Pulse.  Discontinued Metoprolol prescription, changed Flonase to 2 sprays to right nostril only, twice daily.

## 2014-12-18 NOTE — Assessment & Plan Note (Signed)
Increase Prozac because of signs of anhedonia that of been increasing.

## 2014-12-18 NOTE — Telephone Encounter (Signed)
Spoke with Caren Griffins and all information is correct. LM for someone at the home to please return the call to schedule a f/u.

## 2014-12-18 NOTE — Progress Notes (Signed)
BP 94/69 mmHg  Pulse 68  Temp(Src) 97.8 F (36.6 C) (Oral)  Ht 5' 3"  (1.6 m)  Wt 131 lb 12.8 oz (59.784 kg)  BMI 23.35 kg/m2  LMP 12/03/2014 (Approximate)   Subjective:    [name], female    DOB: [birth year], 44 y.o.   MRN: [id number]  HPI: Margaret [name] is a 44 y.o. female presenting on 12/18/2014 for Annual Exam   HPI Well exam Patient presents today for a general physical exam that she needs very 6 months for the facility where she stays in assisted living. She denies any major issues. She said they did treat her for urinary tract infection recently from OB/GYN doctor. Denies any shortness of breath, chest pain, abdominal pain, diarrhea or constipation.  Depression Patient is currently on Prozac 40 mg daily. Per caretaker they have noticed that she has been having more episodes of anhedonia and wanting to sleep all day and not eating. She also has a lot of good days where she is up and her normal self and doing things. They are just concerned that there are days that she is down our days when she is more depressed. She sleeps well at night. Patient does not understand whether she is depressed or not due to mental handicap. This has been going on for the past month or 2.  Hyperlipidemia Patient is currently on fish oils for previously diagnosed hyperlipidemia. We will recheck lipid panels and do a chem panel for screening.  Relevant past medical, surgical, family and social history reviewed and updated as indicated. Interim medical history since our last visit reviewed. Allergies and medications reviewed and updated.  Review of Systems  Constitutional: Negative for fever and chills.  HENT: Negative for congestion, ear discharge, ear pain and sore throat.   Eyes: Negative for redness and visual disturbance.  Respiratory: Negative for cough, chest tightness and shortness of breath.   Cardiovascular: Negative for chest pain and leg swelling.  Gastrointestinal:  Negative for nausea, vomiting, abdominal pain, diarrhea and constipation.  Genitourinary: Negative for dysuria and difficulty urinating.  Musculoskeletal: Negative for back pain and gait problem.  Skin: Negative for rash.  Neurological: Negative for dizziness, light-headedness and headaches.  Psychiatric/Behavioral: Positive for dysphoric mood. Negative for suicidal ideas, behavioral problems, sleep disturbance and agitation.  All other systems reviewed and are negative.   Per HPI unless specifically indicated above     Medication List       This list is accurate as of: 12/18/14 10:58 AM.  Always use your most recent med list.               cholecalciferol 1000 UNITS tablet  Commonly known as:  VITAMIN D  Take 2,000 Units by mouth daily.     clonazePAM 1 MG tablet  Commonly known as:  KLONOPIN  Take 1 mg by mouth 2 (two) times daily.     FISH OIL + D3 PO  Take 1,000 mg by mouth.     FLUoxetine 20 MG tablet  Commonly known as:  PROZAC  Take 3 tablets (60 mg total) by mouth daily.     fluticasone 50 MCG/ACT nasal spray  Commonly known as:  FLONASE  Place 2 sprays into the nose 2 (two) times daily. Use only in right nare, not left.     levonorgestrel 20 MCG/24HR IUD  Commonly known as:  MIRENA  1 each by Intrauterine route once.     multivitamin tablet  Take 1 tablet  by mouth daily.     sulfamethoxazole-trimethoprim 800-160 MG per tablet  Commonly known as:  BACTRIM DS,SEPTRA DS  Take 1 tablet by mouth 2 (two) times daily.           Objective:    BP 94/69 mmHg  Pulse 68  Temp(Src) 97.8 F (36.6 C) (Oral)  Ht 5' 3"  (1.6 m)  Wt 131 lb 12.8 oz (59.784 kg)  BMI 23.35 kg/m2  LMP 12/03/2014 (Approximate)  Wt Readings from Last 3 Encounters:  12/18/14 131 lb 12.8 oz (59.784 kg)  11/17/14 128 lb 12.8 oz (58.423 kg)  11/12/14 129 lb 8 oz (58.741 kg)    Physical Exam  Constitutional: She is oriented to person, place, and time. She appears well-developed and  well-nourished. No distress.  Eyes: Conjunctivae and EOM are normal. Pupils are equal, round, and reactive to light.  Cardiovascular: Normal rate, regular rhythm, normal heart sounds and intact distal pulses.   No murmur heard. Pulmonary/Chest: Effort normal and breath sounds normal. No respiratory distress. She has no wheezes.  Musculoskeletal: Normal range of motion. She exhibits no edema or tenderness.  Neurological: She is alert and oriented to person, place, and time. Coordination normal.  Skin: Skin is warm and dry. No rash noted. She is not diaphoretic.  Psychiatric: Her speech is normal and behavior is normal. Her mood appears not anxious. Her affect is labile. Her affect is not angry, not blunt and not inappropriate. Cognition and memory are impaired (Patient has known mental handicap and developmental delay). She exhibits a depressed mood. She expresses no suicidal ideation. She expresses no suicidal plans.  Vitals reviewed.   Results for orders placed or performed in visit on 11/12/14  POCT Urinalysis Dipstick  Result Value Ref Range   Color, UA yellow    Clarity, UA cloudy    Glucose, UA neg    Bilirubin, UA     Ketones, UA     Spec Grav, UA     Blood, UA 3+    pH, UA     Protein, UA neg    Urobilinogen, UA     Nitrite, UA neg    Leukocytes, UA Negative Negative      Assessment & Plan:   Problem List Items Addressed This Visit      Other   Depression - Primary    Increase Prozac because of signs of anhedonia that of been increasing.      Relevant Medications   FLUoxetine (PROZAC) 20 MG tablet   Hyperlipidemia    On fish oil      Relevant Orders   Lipid panel    Other Visit Diagnoses    Adult general medical exam        No other major issues.    Relevant Orders    BMP8+EGFR        Follow up plan: Return in about 1 year (around 12/18/2015), or if symptoms worsen or fail to improve.  Caryl Pina, MD [facility] 12/18/2014,  10:58 AM

## 2014-12-18 NOTE — Assessment & Plan Note (Signed)
On fish oil

## 2014-12-18 NOTE — Telephone Encounter (Signed)
Tried calling Margaret [name] at [phone] and mail box is full. Needing to schedule an apt and get corrected information.

## 2014-12-19 LAB — BMP8+EGFR
BUN/Creatinine Ratio: 13 (ref 9–23)
BUN: 9 mg/dL (ref 6–24)
CALCIUM: 9.1 mg/dL (ref 8.7–10.2)
CHLORIDE: 101 mmol/L (ref 97–108)
CO2: 21 mmol/L (ref 18–29)
Creatinine, Ser: 0.68 mg/dL (ref 0.57–1.00)
GFR, EST AFRICAN AMERICAN: 125 mL/min/{1.73_m2} (ref >59)
GFR, EST NON AFRICAN AMERICAN: 108 mL/min/{1.73_m2} (ref >59)
Glucose: 70 mg/dL (ref 65–99)
POTASSIUM: 4.5 mmol/L (ref 3.5–5.2)
SODIUM: 143 mmol/L (ref 134–144)

## 2014-12-19 LAB — LIPID PANEL
CHOL/HDL RATIO: 3.3 ratio (ref 0.0–4.4)
CHOLESTEROL TOTAL: 155 mg/dL (ref 100–199)
HDL: 47 mg/dL (ref >39)
LDL CALC: 88 mg/dL (ref 0–99)
TRIGLYCERIDES: 99 mg/dL (ref 0–149)
VLDL CHOLESTEROL CAL: 20 mg/dL (ref 5–40)

## 2014-12-22 ENCOUNTER — Telehealth (INDEPENDENT_AMBULATORY_CARE_PROVIDER_SITE_OTHER): Payer: Self-pay | Admitting: Internal Medicine

## 2014-12-22 ENCOUNTER — Encounter: Payer: Self-pay | Admitting: Adult Health

## 2014-12-22 NOTE — Telephone Encounter (Signed)
Please refer to Terri's phone encounter 11/27/14.

## 2014-12-22 NOTE — Telephone Encounter (Signed)
Butch Penny, Please get this patient an appt. In 2 months.

## 2014-12-23 ENCOUNTER — Encounter: Payer: Self-pay | Admitting: *Deleted

## 2014-12-24 ENCOUNTER — Other Ambulatory Visit: Payer: Self-pay | Admitting: *Deleted

## 2014-12-24 MED ORDER — ONE-DAILY MULTI VITAMINS PO TABS: 1.0000 | ORAL_TABLET | Freq: Every day | ORAL | Status: DC

## 2014-12-24 MED ORDER — FISH OIL + D3 1000-1000 MG-UNIT PO CAPS: 1.0000 | ORAL_CAPSULE | Freq: Two times a day (BID) | ORAL | Status: DC

## 2014-12-25 NOTE — Telephone Encounter (Signed)
Apt has been scheduled for 02/19/15 with Deberah Castle, NP.

## 2015-01-12 DIAGNOSIS — F250 Schizoaffective disorder, bipolar type: Secondary | ICD-10-CM | POA: Diagnosis not present

## 2015-01-12 DIAGNOSIS — Z23 Encounter for immunization: Secondary | ICD-10-CM | POA: Diagnosis not present

## 2015-01-12 DIAGNOSIS — F70 Mild intellectual disabilities: Secondary | ICD-10-CM | POA: Diagnosis not present

## 2015-01-12 DIAGNOSIS — F919 Conduct disorder, unspecified: Secondary | ICD-10-CM | POA: Diagnosis not present

## 2015-01-14 ENCOUNTER — Other Ambulatory Visit: Payer: Medicare Other | Admitting: Adult Health

## 2015-01-14 DIAGNOSIS — Z79899 Other long term (current) drug therapy: Secondary | ICD-10-CM | POA: Diagnosis not present

## 2015-01-14 DIAGNOSIS — Z5181 Encounter for therapeutic drug level monitoring: Secondary | ICD-10-CM | POA: Diagnosis not present

## 2015-01-21 ENCOUNTER — Other Ambulatory Visit: Payer: Medicare Other | Admitting: Adult Health

## 2015-02-18 ENCOUNTER — Ambulatory Visit (INDEPENDENT_AMBULATORY_CARE_PROVIDER_SITE_OTHER): Payer: Medicare Other | Admitting: Family Medicine

## 2015-02-18 ENCOUNTER — Encounter: Payer: Self-pay | Admitting: Family Medicine

## 2015-02-18 VITALS — BP 107/70 | HR 93 | Temp 97.0°F | Ht 63.0 in | Wt 135.4 lb

## 2015-02-18 DIAGNOSIS — F329 Major depressive disorder, single episode, unspecified: Secondary | ICD-10-CM | POA: Diagnosis not present

## 2015-02-18 DIAGNOSIS — F32A Depression, unspecified: Secondary | ICD-10-CM

## 2015-02-18 NOTE — Assessment & Plan Note (Signed)
Doing well on current medications, continue current dosing and follow-up in 4 months unless anything changes before then.

## 2015-02-18 NOTE — Progress Notes (Signed)
BP 107/70 mmHg  Pulse 93  Temp(Src) 97 F (36.1 C) (Oral)  Ht 5' 3"  (1.6 m)  Wt 135 lb 6 oz (61.406 kg)  BMI 23.99 kg/m2  LMP 11/18/2014   Subjective:    [name], female    DOB: 10-30-1970, 44 y.o.   MRN: [id number]  HPI: Margaret [name] is a 44 y.o. female presenting on 02/18/2015 for 2 month follow up   HPI Depression follow-up Patient presents today for a depression recheck with her transport caretaker. They feel she is doing a lot better on the current dose of Prozac. She is getting up more and being involved in more of the activities in participating in the day-to-day events. They feel she is a lot happier and doing much better. She denies any suicidal ideations. She is sleeping well.  Relevant past medical, surgical, family and social history reviewed and updated as indicated. Interim medical history since our last visit reviewed. Allergies and medications reviewed and updated.  Review of Systems  Constitutional: Negative for fever and chills.  HENT: Negative for congestion, ear discharge and ear pain.   Eyes: Negative for redness and visual disturbance.  Respiratory: Negative for chest tightness and shortness of breath.   Cardiovascular: Negative for chest pain and leg swelling.  Genitourinary: Negative for dysuria and difficulty urinating.  Musculoskeletal: Negative for back pain and gait problem.  Skin: Negative for rash.  Neurological: Negative for light-headedness and headaches.  Psychiatric/Behavioral: Negative for suicidal ideas, behavioral problems, sleep disturbance, self-injury, dysphoric mood, decreased concentration and agitation. The patient is not nervous/anxious.   All other systems reviewed and are negative.   Per HPI unless specifically indicated above     Medication List       This list is accurate as of: 02/18/15 10:32 AM.  Always use your most recent med list.               cholecalciferol 1000 UNITS tablet  Commonly  known as:  VITAMIN D  Take 2,000 Units by mouth daily.     clonazePAM 1 MG tablet  Commonly known as:  KLONOPIN  Take 1 mg by mouth 2 (two) times daily.     FISH OIL + D3 1000-1000 MG-UNIT Caps  Take 1 capsule by mouth 2 (two) times daily.     FLUoxetine 20 MG tablet  Commonly known as:  PROZAC  Take 3 tablets (60 mg total) by mouth daily.     fluticasone 50 MCG/ACT nasal spray  Commonly known as:  FLONASE  Place 2 sprays into the nose 2 (two) times daily. Use only in right nare, not left.     levonorgestrel 20 MCG/24HR IUD  Commonly known as:  MIRENA  1 each by Intrauterine route once.     multivitamin tablet  Take 1 tablet by mouth daily.           Objective:    BP 107/70 mmHg  Pulse 93  Temp(Src) 97 F (36.1 C) (Oral)  Ht 5' 3"  (1.6 m)  Wt 135 lb 6 oz (61.406 kg)  BMI 23.99 kg/m2  LMP 11/18/2014  Wt Readings from Last 3 Encounters:  02/18/15 135 lb 6 oz (61.406 kg)  12/18/14 131 lb 12.8 oz (59.784 kg)  11/17/14 128 lb 12.8 oz (58.423 kg)    Physical Exam  Constitutional: She is oriented to person, place, and time. She appears well-developed and well-nourished. No distress.  Eyes: Conjunctivae and EOM are normal. Pupils are equal, round, and  reactive to light.  Cardiovascular: Normal rate, regular rhythm, normal heart sounds and intact distal pulses.   No murmur heard. Pulmonary/Chest: Effort normal and breath sounds normal. No respiratory distress. She has no wheezes.  Musculoskeletal: Normal range of motion. She exhibits no edema or tenderness.  Neurological: She is alert and oriented to person, place, and time. Coordination normal.  Skin: Skin is warm and dry. No rash noted. She is not diaphoretic.  Psychiatric: She has a normal mood and affect. Her behavior is normal. Judgment and thought content normal. Her affect is not angry, not labile and not inappropriate. Cognition and memory are impaired (she has known impaired cognition and that is why she is at  that facility that she is currently). She does not exhibit a depressed mood. She expresses no suicidal ideation. She expresses no suicidal plans.  Nursing note and vitals reviewed.   Results for orders placed or performed in visit on 12/18/14  Lipid panel  Result Value Ref Range   Cholesterol, Total 155 100 - 199 mg/dL   Triglycerides 99 0 - 149 mg/dL   HDL 47 >39 mg/dL   VLDL Cholesterol Cal 20 5 - 40 mg/dL   LDL Calculated 88 0 - 99 mg/dL   Chol/HDL Ratio 3.3 0.0 - 4.4 ratio units  BMP8+EGFR  Result Value Ref Range   Glucose 70 65 - 99 mg/dL   BUN 9 6 - 24 mg/dL   Creatinine, Ser 0.68 0.57 - 1.00 mg/dL   GFR calc non Af Amer 108 >59 mL/min/1.73   GFR calc Af Amer 125 >59 mL/min/1.73   BUN/Creatinine Ratio 13 9 - 23   Sodium 143 134 - 144 mmol/L   Potassium 4.5 3.5 - 5.2 mmol/L   Chloride 101 97 - 108 mmol/L   CO2 21 18 - 29 mmol/L   Calcium 9.1 8.7 - 10.2 mg/dL      Assessment & Plan:   Problem List Items Addressed This Visit      Other   Depression - Primary    Doing well on current medications, continue current dosing and follow-up in 4 months unless anything changes before then.          Follow up plan: Return in about 4 months (around 06/18/2015), or if symptoms worsen or fail to improve, for Depression follow-up.  Counseling provided for all of the vaccine components No orders of the defined types were placed in this encounter.    Caryl Pina, MD [facility] 02/18/2015, 10:32 AM

## 2015-02-19 ENCOUNTER — Ambulatory Visit (INDEPENDENT_AMBULATORY_CARE_PROVIDER_SITE_OTHER): Payer: Medicare Other | Admitting: Internal Medicine

## 2015-02-19 ENCOUNTER — Encounter (INDEPENDENT_AMBULATORY_CARE_PROVIDER_SITE_OTHER): Payer: Self-pay | Admitting: Internal Medicine

## 2015-02-19 VITALS — BP 98/60 | HR 76 | Temp 97.6°F | Ht 64.0 in | Wt 136.0 lb

## 2015-02-19 DIAGNOSIS — B180 Chronic viral hepatitis B with delta-agent: Secondary | ICD-10-CM

## 2015-02-19 LAB — COMPREHENSIVE METABOLIC PANEL
ALT: 18 U/L (ref 6–29)
AST: 21 U/L (ref 10–30)
Albumin: 4 g/dL (ref 3.6–5.1)
Alkaline Phosphatase: 64 U/L (ref 33–115)
BUN: 9 mg/dL (ref 7–25)
CHLORIDE: 104 mmol/L (ref 98–110)
CO2: 27 mmol/L (ref 20–31)
Calcium: 8.9 mg/dL (ref 8.6–10.2)
Creat: 0.56 mg/dL (ref 0.50–1.10)
GLUCOSE: 93 mg/dL (ref 65–99)
POTASSIUM: 3.5 mmol/L (ref 3.5–5.3)
Sodium: 141 mmol/L (ref 135–146)
Total Bilirubin: 0.4 mg/dL (ref 0.2–1.2)
Total Protein: 6.3 g/dL (ref 6.1–8.1)

## 2015-02-19 NOTE — Patient Instructions (Signed)
OV 6 weeks.

## 2015-02-19 NOTE — Progress Notes (Addendum)
Subjective:    [name], female    DOB: 1970/11/27, 44 y.o.   MRN: [id number]  HPI Here today for f/u of her Hepatitis B. She is adopted. She lived in [place]. As a child, she had TB and did receive tx.  Sh is adopted. She tells me she has been doing good. She has gained about 7 pounds.Her appetite is good.    She usually has a BM x 2 a week. No melena or BRRB.   11/27/2014 [place] Elast:  Corresponding Metavir fibrosis score: F2/F3 Risk of fibrosis: Moderate  11/10/2014 Hep B Surface Antibody negative. Hepatitis B DNA Ultra 2336 He B Surface antigen positive Hep B E Antigen negative. Hep B Surface Antibody Negative.  . .      05/10/2013 CXR; No evidence of active TB infections.  7/28//2015 Hep B quaint 11,000 copies. WBC 5.4, RBC 4.39, H and H 13.1 and 40.2, MCV 91.6, platelet ct 211, absolute neutrophils 308, Absolute lympocytes 1928  12/20/2011 12/20/2011 Liver US: incidental 2.5cm gallstone, normal echotexture. Repeat liver US Nov 2015 only showed gallstones.  10/22/2011 Hep B antigen positive, Hep C antibody negative, Hep B surface antibody negative, Hep B core mAB negative. Hep B surface AB negative, Hepatitis A antibody positive 12/18/2013 Hep B E ag negative     Review of Systems   Past Medical History  Diagnosis Date  . Schizophrenia ([place])   . Impulse disorder   . Hepatitis B carrier   . Mild intellectual disability   . Urinary frequency 11/12/2014  . Hematuria 11/12/2014  . IUD (intrauterine device) in place 11/12/2014    Inserted 12[facility]24/12 at [facility]  . Schizophrenia ([place])   . Hypothyroidism   . Tuberculosis     as a child    Past Surgical History  Procedure Laterality Date  . Tracheal surgery    . Tracheostomy    . Arm wound repair / closure      No Known Allergies  Current Outpatient Prescriptions on File Prior to Visit  Medication Sig Dispense Refill  . cholecalciferol (VITAMIN D) 1000 UNITS tablet Take 2,000 Units by mouth daily.    .  clonazePAM (KLONOPIN) 1 MG tablet Take 1 mg by mouth 2 (two) times daily.     . Fish Oil-Cholecalciferol (FISH OIL + D3) 1000-1000 MG-UNIT CAPS Take 1 capsule by mouth 2 (two) times daily. 60 capsule 5  . FLUoxetine (PROZAC) 20 MG tablet Take 3 tablets (60 mg total) by mouth daily. 90 tablet 2  . fluticasone (FLONASE) 50 MCG/ACT nasal spray Place 2 sprays into the nose 2 (two) times daily. Use only in right nare, not left.    Marland Kitchen levonorgestrel (MIRENA) 20 MCG/24HR IUD 1 each by Intrauterine route once.    . Multiple Vitamin (MULTIVITAMIN) tablet Take 1 tablet by mouth daily. 30 tablet 5   No current facility-administered medications on file prior to visit.        Objective:   Physical ExamBlood pressure 98/60, pulse 76, temperature 97.6 F (36.4 C), height 5' 4"  (1.626 m), weight 136 lb (61.689 kg), last menstrual period 11/18/2014.  Alert and oriented. Skin warm and dry. Oral mucosa is moist.   . Sclera anicteric, conjunctivae is pink. Thyroid not enlarged. No cervical lymphadenopathy. Bilateral wheezes.Marland Kitchen Heart regular rate and rhythm.  Abdomen is soft. Bowel sounds are positive. No hepatomegaly. No abdominal masses felt. No tenderness.  No edema to lower extremities.         Assessment &  Plan:  Hepatitis B.. Hepatitis B E antigen, HIV, Hep B DNA quaint, CMET. US liver biopsy. OV in 6 weeks. I discussed with Dr. Laural Golden.

## 2015-02-20 ENCOUNTER — Other Ambulatory Visit: Payer: Medicare Other | Admitting: Family Medicine

## 2015-02-20 LAB — HIV ANTIBODY (ROUTINE TESTING W REFLEX): HIV: NONREACTIVE

## 2015-02-23 ENCOUNTER — Ambulatory Visit (INDEPENDENT_AMBULATORY_CARE_PROVIDER_SITE_OTHER): Payer: Medicare Other | Admitting: Family Medicine

## 2015-02-23 ENCOUNTER — Encounter: Payer: Self-pay | Admitting: Family Medicine

## 2015-02-23 VITALS — BP 104/72 | HR 97 | Temp 98.1°F | Ht 64.0 in | Wt 138.8 lb

## 2015-02-23 DIAGNOSIS — Z01419 Encounter for gynecological examination (general) (routine) without abnormal findings: Secondary | ICD-10-CM

## 2015-02-23 LAB — HEPATITIS B DNA, QUALITATIVE: Hepatitis B Virus DNA Qual: DETECTED — AB

## 2015-02-23 LAB — HEPATITIS B E ANTIGEN: Hepatitis Be Antigen: NONREACTIVE

## 2015-02-23 NOTE — Progress Notes (Signed)
BP 104/72 mmHg  Pulse 97  Temp(Src) 98.1 F (36.7 C) (Oral)  Ht 5' 4"  (1.626 m)  Wt 138 lb 12.8 oz (62.959 kg)  BMI 23.81 kg/m2  LMP 01/17/2015 (Approximate)   Subjective:    [name], female    DOB: March 20, 1971, 44 y.o.   MRN: [id number]  HPI: Margaret [name] is a 44 y.o. female presenting on 02/23/2015 for Gynecologic Exam   HPI Well woman exam and Pap Patient presents today for routine well woman exam and mammogram and Pap smear. She denies any issues with menstruation or with her breasts. She denies any vaginal discharge or irritation.  Relevant past medical, surgical, family and social history reviewed and updated as indicated. Interim medical history since our last visit reviewed. Allergies and medications reviewed and updated.  Review of Systems  Constitutional: Negative for fever and chills.  HENT: Negative for congestion, ear discharge and ear pain.   Eyes: Negative for redness and visual disturbance.  Respiratory: Negative for chest tightness and shortness of breath.   Cardiovascular: Negative for chest pain and leg swelling.  Genitourinary: Negative for dysuria, flank pain, decreased urine volume, vaginal bleeding, vaginal discharge, difficulty urinating, vaginal pain, menstrual problem and pelvic pain.  Musculoskeletal: Negative for back pain and gait problem.  Skin: Negative for rash.  Neurological: Negative for light-headedness and headaches.  Psychiatric/Behavioral: Negative for behavioral problems and agitation.  All other systems reviewed and are negative.   Per HPI unless specifically indicated above     Medication List       This list is accurate as of: 02/23/15  2:27 PM.  Always use your most recent med list.               acetaminophen 325 MG suppository  Commonly known as:  TYLENOL  Place 325 mg rectally every 4 (four) hours as needed.     cholecalciferol 1000 UNITS tablet  Commonly known as:  VITAMIN D  Take 2,000 Units  by mouth daily.     clonazePAM 1 MG tablet  Commonly known as:  KLONOPIN  Take 1 mg by mouth 2 (two) times daily.     FISH OIL + D3 1000-1000 MG-UNIT Caps  Take 1 capsule by mouth 2 (two) times daily.     FLUoxetine 20 MG tablet  Commonly known as:  PROZAC  Take 3 tablets (60 mg total) by mouth daily.     fluticasone 50 MCG/ACT nasal spray  Commonly known as:  FLONASE  Place 2 sprays into the nose 2 (two) times daily. Use only in right nare, not left.     levonorgestrel 20 MCG/24HR IUD  Commonly known as:  MIRENA  1 each by Intrauterine route once.     multivitamin tablet  Take 1 tablet by mouth daily.           Objective:    BP 104/72 mmHg  Pulse 97  Temp(Src) 98.1 F (36.7 C) (Oral)  Ht 5' 4"  (1.626 m)  Wt 138 lb 12.8 oz (62.959 kg)  BMI 23.81 kg/m2  LMP 01/17/2015 (Approximate)  Wt Readings from Last 3 Encounters:  02/23/15 138 lb 12.8 oz (62.959 kg)  02/19/15 136 lb (61.689 kg)  02/18/15 135 lb 6 oz (61.406 kg)    Physical Exam  Constitutional: She is oriented to person, place, and time. She appears well-developed and well-nourished. No distress.  Eyes: Conjunctivae and EOM are normal. Pupils are equal, round, and reactive to light.  Neck: Neck supple.  No thyromegaly present.  Cardiovascular: Normal rate, regular rhythm, normal heart sounds and intact distal pulses.   No murmur heard. Pulmonary/Chest: Effort normal and breath sounds normal. No respiratory distress. She has no wheezes. Right breast exhibits no inverted nipple, no mass, no nipple discharge, no skin change and no tenderness. Left breast exhibits no inverted nipple, no mass, no nipple discharge, no skin change and no tenderness. Breasts are symmetrical.  Abdominal: Soft. Bowel sounds are normal. She exhibits no distension. There is no tenderness. There is no rebound and no guarding.  Genitourinary: Vagina normal and uterus normal. No breast swelling, tenderness, discharge or bleeding. Pelvic exam  was performed with patient supine. There is no rash or lesion on the right labia. There is no rash or lesion on the left labia. Uterus is not deviated, not enlarged, not fixed and not tender. Cervix exhibits no motion tenderness, no discharge and no friability. Right adnexum displays no mass and no tenderness. Left adnexum displays no mass and no tenderness.  Musculoskeletal: Normal range of motion. She exhibits no edema.  Lymphadenopathy:    She has no cervical adenopathy.    She has no axillary adenopathy.  Neurological: She is alert and oriented to person, place, and time. Coordination normal.  Skin: Skin is warm and dry. No rash noted. She is not diaphoretic.  Psychiatric: She has a normal mood and affect. Her behavior is normal.  Vitals reviewed.   Results for orders placed or performed in visit on 02/19/15  Hepatitis B E Antigen  Result Value Ref Range   Hepatitis Be Antigen NON-REACTIVE NON-REACTIVE  Hepatitis B DNA, qualitative  Result Value Ref Range   SPECIMEN SOURCE - HEPB     Hepatitis B Virus DNA Qual    HIV antibody (with reflex)  Result Value Ref Range   HIV 1&2 Ab, 4th Generation NONREACTIVE NONREACTIVE  Comprehensive Metabolic Panel (CMET)  Result Value Ref Range   Sodium 141 135 - 146 mmol/L   Potassium 3.5 3.5 - 5.3 mmol/L   Chloride 104 98 - 110 mmol/L   CO2 27 20 - 31 mmol/L   Glucose, Bld 93 65 - 99 mg/dL   BUN 9 7 - 25 mg/dL   Creat 0.56 0.50 - 1.10 mg/dL   Total Bilirubin 0.4 0.2 - 1.2 mg/dL   Alkaline Phosphatase 64 33 - 115 U/L   AST 21 10 - 30 U/L   ALT 18 6 - 29 U/L   Total Protein 6.3 6.1 - 8.1 g/dL   Albumin 4.0 3.6 - 5.1 g/dL   Calcium 8.9 8.6 - 10.2 mg/dL      Assessment & Plan:   Problem List Items Addressed This Visit    None    Visit Diagnoses    Well woman exam with routine gynecological exam    -  Primary    Pap performed get mammogram as well    Relevant Orders    Pap IG, rfx HPV all pth        Follow up plan: Return if  symptoms worsen or fail to improve.  Counseling provided for all of the vaccine components No orders of the defined types were placed in this encounter.    Caryl Pina, MD [facility] 02/23/2015, 2:27 PM

## 2015-02-25 LAB — PAP IG, RFX HPV ALL PTH: PAP Smear Comment: 0

## 2015-03-09 ENCOUNTER — Other Ambulatory Visit: Payer: Self-pay | Admitting: Radiology

## 2015-03-10 ENCOUNTER — Ambulatory Visit (HOSPITAL_COMMUNITY)
Admission: RE | Admit: 2015-03-10 | Discharge: 2015-03-10 | Disposition: A | Discharge status: Home / Self Care | Payer: Medicare Other | Source: Ambulatory Visit | Attending: Internal Medicine | Admitting: Internal Medicine

## 2015-03-10 ENCOUNTER — Ambulatory Visit (HOSPITAL_COMMUNITY): Payer: Medicare Other

## 2015-03-10 DIAGNOSIS — B180 Chronic viral hepatitis B with delta-agent: Secondary | ICD-10-CM

## 2015-03-10 MED ORDER — SODIUM CHLORIDE 0.9 % IV SOLN: Freq: Once | INTRAVENOUS | Status: DC

## 2015-03-10 NOTE — Progress Notes (Signed)
Pt scheduled for US liver bx. Pt ate french toast about 0800, not NPO Procedure will be rescheduled for later date.  Ascencion Dike PA-C Interventional Radiology 03/10/2015 9:48 AM

## 2015-03-16 DIAGNOSIS — Z5181 Encounter for therapeutic drug level monitoring: Secondary | ICD-10-CM | POA: Diagnosis not present

## 2015-03-16 DIAGNOSIS — Z79899 Other long term (current) drug therapy: Secondary | ICD-10-CM | POA: Diagnosis not present

## 2015-03-20 ENCOUNTER — Telehealth: Payer: Self-pay | Admitting: Family Medicine

## 2015-03-20 MED ORDER — FLUOXETINE HCL 20 MG PO TABS: 60.0000 mg | ORAL_TABLET | Freq: Every day | ORAL | Status: DC

## 2015-03-20 NOTE — Telephone Encounter (Signed)
Done

## 2015-03-21 ENCOUNTER — Ambulatory Visit (HOSPITAL_COMMUNITY): Payer: Medicare Other

## 2015-03-23 ENCOUNTER — Other Ambulatory Visit: Payer: Self-pay | Admitting: Radiology

## 2015-03-24 ENCOUNTER — Ambulatory Visit (HOSPITAL_COMMUNITY)
Admission: RE | Admit: 2015-03-24 | Discharge: 2015-03-24 | Disposition: A | Discharge status: Home / Self Care | Payer: Medicare Other | Source: Ambulatory Visit | Attending: Internal Medicine | Admitting: Internal Medicine

## 2015-03-24 DIAGNOSIS — B1910 Unspecified viral hepatitis B without hepatic coma: Secondary | ICD-10-CM | POA: Insufficient documentation

## 2015-03-24 DIAGNOSIS — E039 Hypothyroidism, unspecified: Secondary | ICD-10-CM | POA: Diagnosis not present

## 2015-03-24 DIAGNOSIS — F639 Impulse disorder, unspecified: Secondary | ICD-10-CM | POA: Insufficient documentation

## 2015-03-24 DIAGNOSIS — F209 Schizophrenia, unspecified: Secondary | ICD-10-CM | POA: Insufficient documentation

## 2015-03-24 DIAGNOSIS — F70 Mild intellectual disabilities: Secondary | ICD-10-CM | POA: Diagnosis not present

## 2015-03-24 DIAGNOSIS — Z87891 Personal history of nicotine dependence: Secondary | ICD-10-CM | POA: Insufficient documentation

## 2015-03-24 DIAGNOSIS — Z79899 Other long term (current) drug therapy: Secondary | ICD-10-CM | POA: Insufficient documentation

## 2015-03-24 DIAGNOSIS — K7460 Unspecified cirrhosis of liver: Secondary | ICD-10-CM | POA: Insufficient documentation

## 2015-03-24 DIAGNOSIS — B180 Chronic viral hepatitis B with delta-agent: Secondary | ICD-10-CM | POA: Diagnosis not present

## 2015-03-24 DIAGNOSIS — B199 Unspecified viral hepatitis without hepatic coma: Secondary | ICD-10-CM | POA: Insufficient documentation

## 2015-03-24 DIAGNOSIS — B181 Chronic viral hepatitis B without delta-agent: Secondary | ICD-10-CM | POA: Diagnosis not present

## 2015-03-24 DIAGNOSIS — K769 Liver disease, unspecified: Secondary | ICD-10-CM | POA: Diagnosis not present

## 2015-03-24 LAB — CBC WITH DIFFERENTIAL/PLATELET
BASOS PCT: 1 %
Basophils Absolute: 0.1 10*3/uL (ref 0.0–0.1)
EOS ABS: 0.1 10*3/uL (ref 0.0–0.7)
EOS PCT: 1 %
HCT: 37.6 % (ref 36.0–46.0)
Hemoglobin: 12.3 g/dL (ref 12.0–15.0)
LYMPHS ABS: 1.7 10*3/uL (ref 0.7–4.0)
Lymphocytes Relative: 20 %
MCH: 29.6 pg (ref 26.0–34.0)
MCHC: 32.7 g/dL (ref 30.0–36.0)
MCV: 90.4 fL (ref 78.0–100.0)
MONO ABS: 0.5 10*3/uL (ref 0.1–1.0)
MONOS PCT: 6 %
Neutro Abs: 6.3 10*3/uL (ref 1.7–7.7)
Neutrophils Relative %: 72 %
Platelets: 158 10*3/uL (ref 150–400)
RBC: 4.16 MIL/uL (ref 3.87–5.11)
RDW: 12.6 % (ref 11.5–15.5)
WBC: 8.7 10*3/uL (ref 4.0–10.5)

## 2015-03-24 LAB — COMPREHENSIVE METABOLIC PANEL
ALBUMIN: 3.6 g/dL (ref 3.5–5.0)
ALK PHOS: 67 U/L (ref 38–126)
ALT: 17 U/L (ref 14–54)
ANION GAP: 7 (ref 5–15)
AST: 21 U/L (ref 15–41)
BUN: 8 mg/dL (ref 6–20)
CALCIUM: 9.2 mg/dL (ref 8.9–10.3)
CHLORIDE: 107 mmol/L (ref 101–111)
CO2: 29 mmol/L (ref 22–32)
Creatinine, Ser: 0.63 mg/dL (ref 0.44–1.00)
GFR calc non Af Amer: >60 mL/min (ref >60)
Glucose, Bld: 100 mg/dL — ABNORMAL HIGH (ref 65–99)
POTASSIUM: 3.9 mmol/L (ref 3.5–5.1)
SODIUM: 143 mmol/L (ref 135–145)
Total Bilirubin: 0.5 mg/dL (ref 0.3–1.2)
Total Protein: 6.4 g/dL — ABNORMAL LOW (ref 6.5–8.1)

## 2015-03-24 LAB — PROTIME-INR
INR: 1.04 (ref 0.00–1.49)
Prothrombin Time: 13.8 seconds (ref 11.6–15.2)

## 2015-03-24 IMAGING — US US BIOPSY
1 series · 9 of 9 positions shown · non-contrast
Comparison: Abdominal ultrasound [DATE]

CLINICAL DATA: 44-year-old female with history of chronic
hepatitis-B. She presents for random ultrasound-guided liver biopsy
to evaluate for underlying cirrhosis.

EXAM:
ULTRASOUND GUIDED NEEDLE ASPIRATE BIOPSY OF THE THYROID GLAND

[Series 1: us biopsy · 0.19mm/px · 9 of 9 slices shown]
[im 1/9]
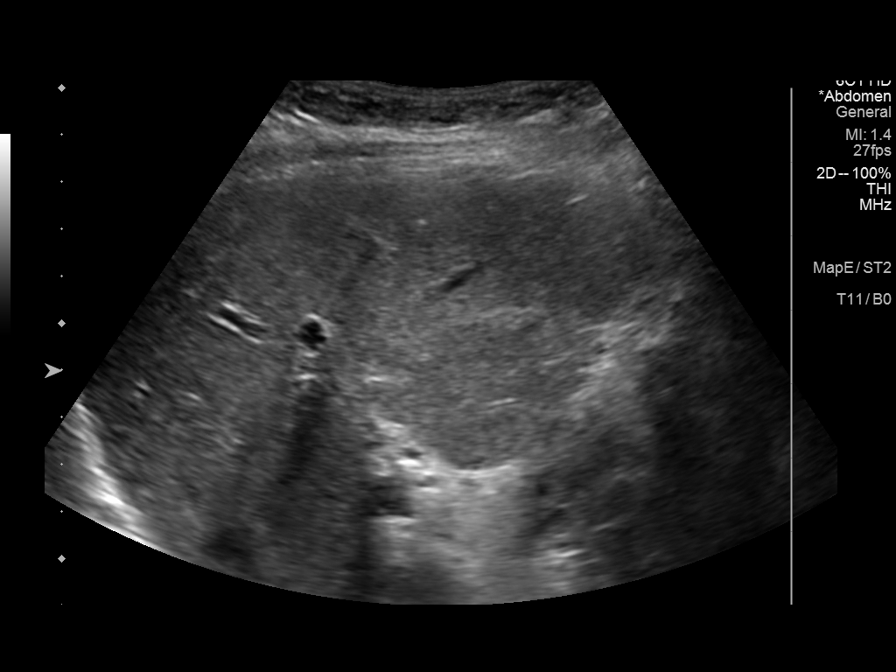
[im 2/9]
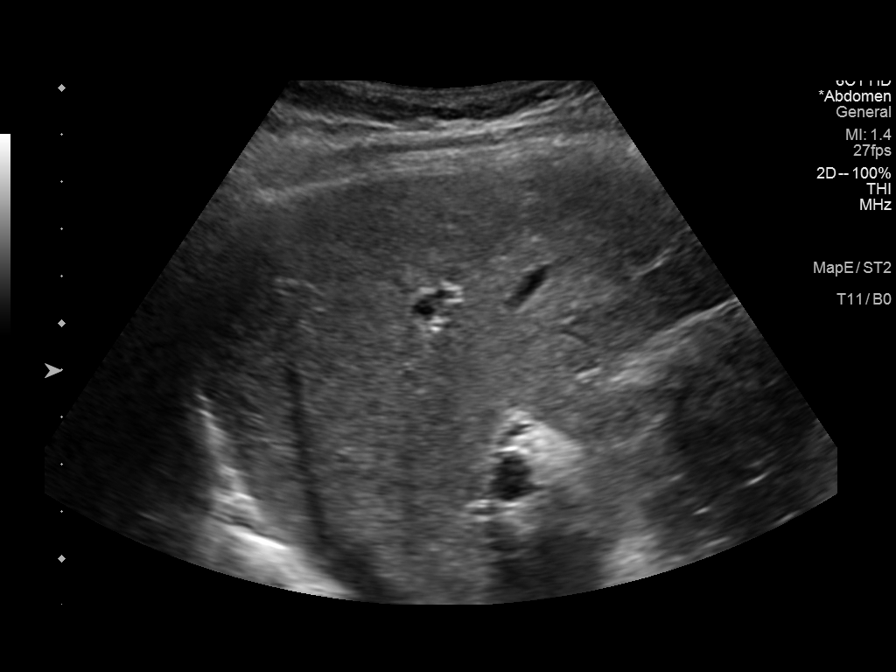
[im 3/9]
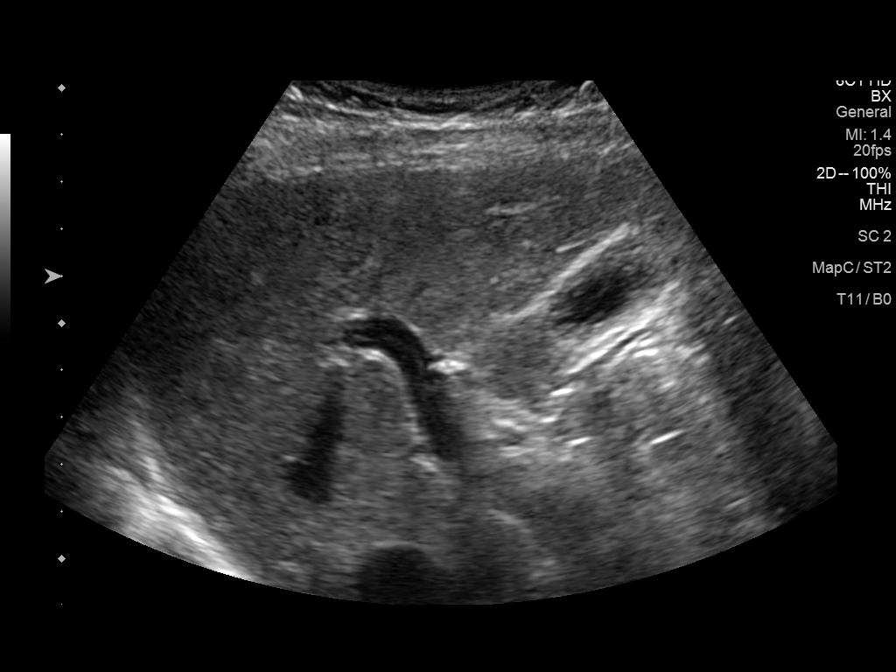
[im 4/9]
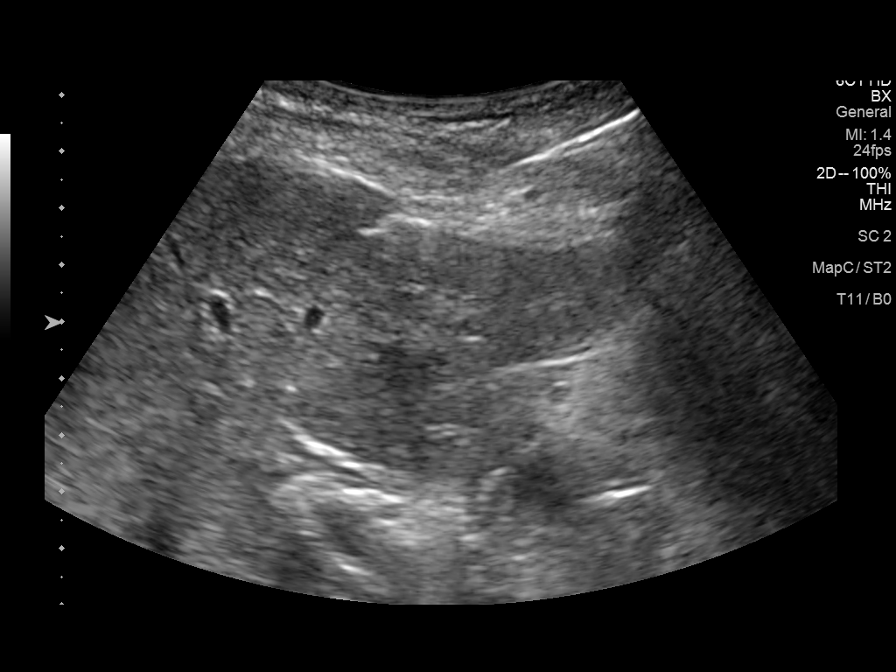
[im 5/9]
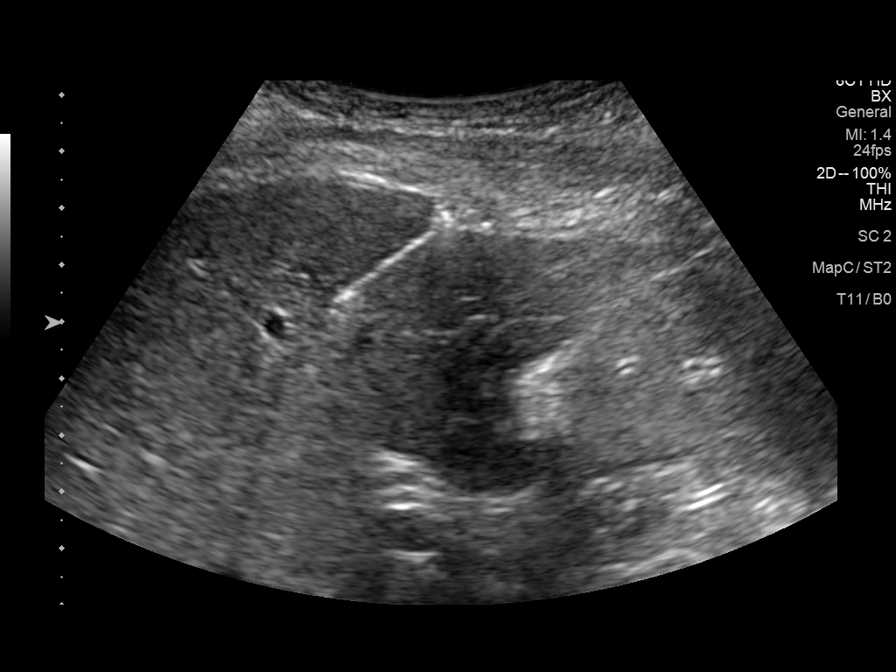
[im 6/9]
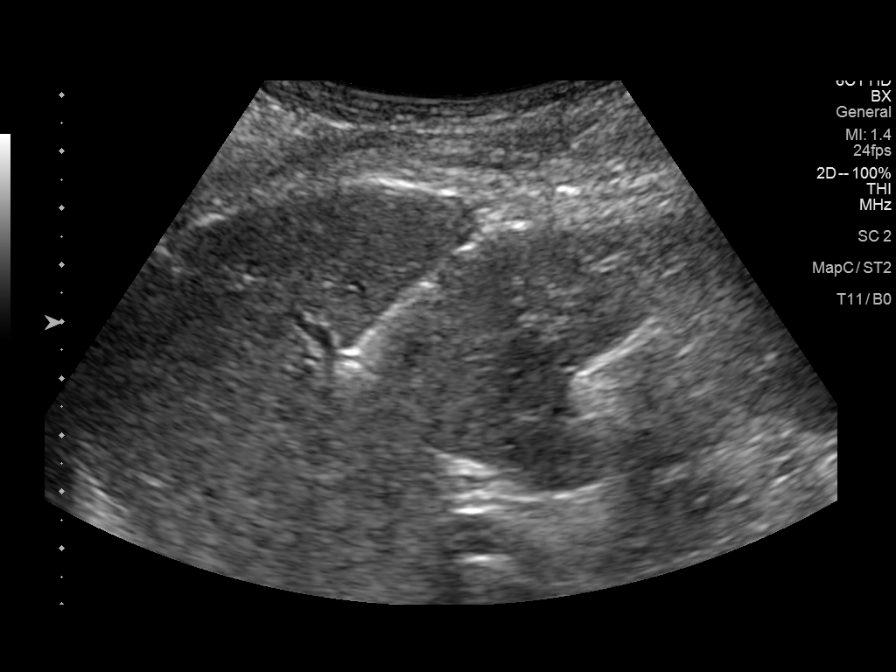
[im 7/9]
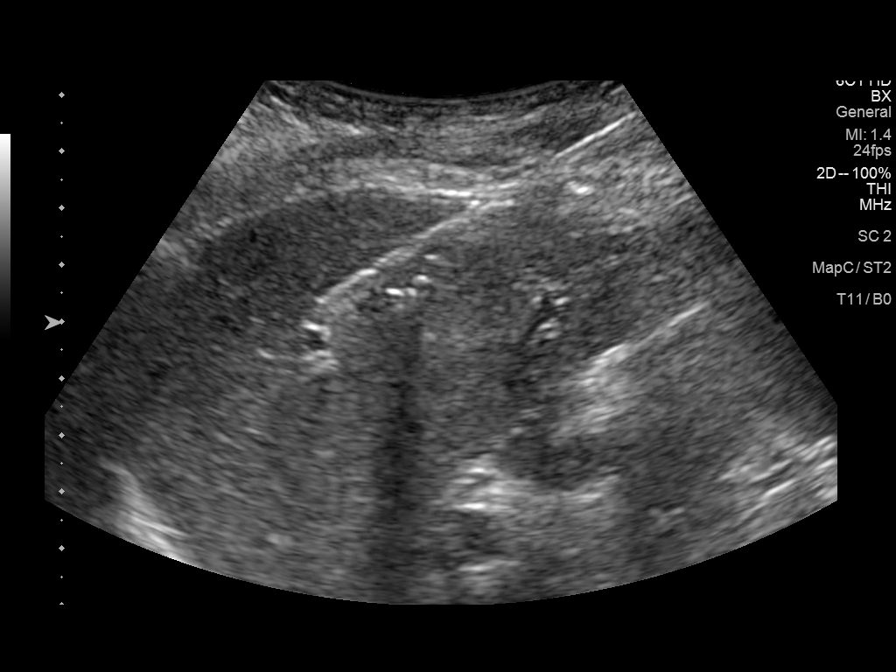
[im 8/9]
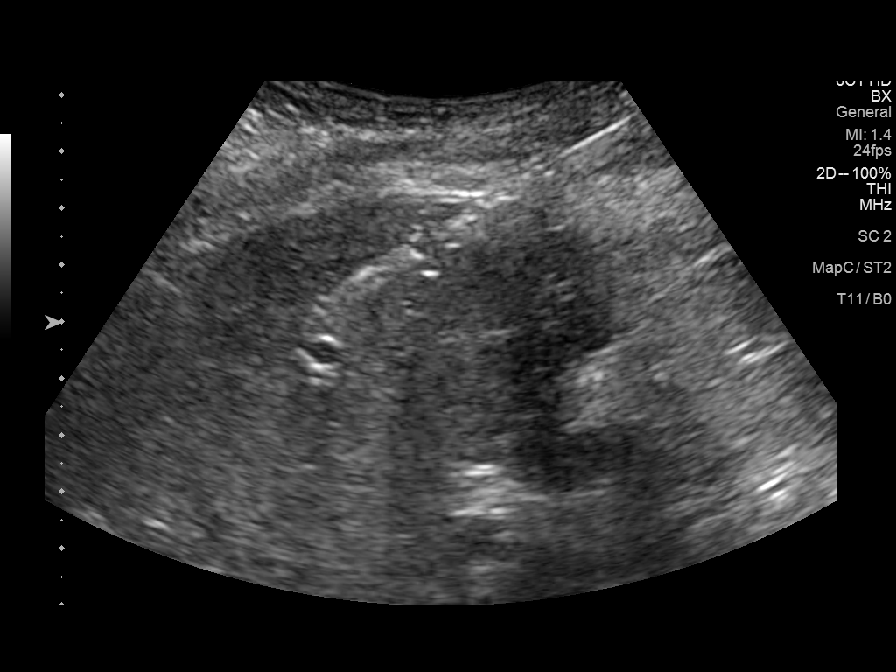
[im 9/9]
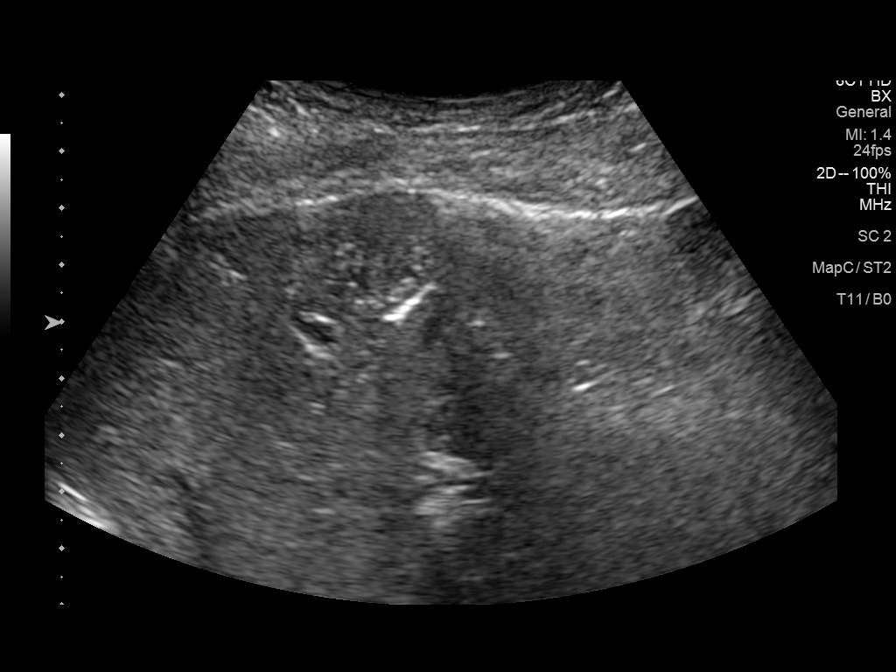

[9 of 9 positions shown; findings below may reference images not displayed]

PROCEDURE:
Thyroid biopsy was thoroughly discussed with the patient and
questions were answered. The benefits, risks, alternatives, and
complications were also discussed. The patient understands and
wishes to proceed with the procedure. Written consent was obtained.

Ultrasound was performed to localize and mark an adequate site for
the biopsy. The patient was then prepped and draped in a normal
sterile fashion. Local anesthesia was provided with 1% lidocaine.
Using direct ultrasound guidance, a 17 gauge introducer needle was
advanced into the margin of the liver. Multiple 18 gauge core
biopsies were then coaxially obtained using the LOCA automated
biopsy device. Ultrasound was used to confirm needle placements on
all occasions. Specimens were sent to Pathology for analysis.
Following biopsy, Gel-Foam embolization was performed of the biopsy
tract through the 17 gauge introducer needle.

COMPLICATIONS:
None

Estimated blood loss: 0
FINDINGS: Sonographically unremarkable appearance of the liver parenchyma.
Technically successful random biopsy.
IMPRESSION: Ultrasound guided random core biopsy of the liver.

## 2015-03-24 MED ORDER — SODIUM CHLORIDE 0.9 % IV SOLN
INTRAVENOUS | Status: DC
  Administered 2015-03-24: 11:00:00 via INTRAVENOUS

## 2015-03-24 MED ORDER — MIDAZOLAM HCL 2 MG/2ML IJ SOLN
INTRAMUSCULAR | Status: AC | PRN
  Administered 2015-03-24: 1 mg via INTRAVENOUS
  Administered 2015-03-24: 0.5 mg via INTRAVENOUS

## 2015-03-24 MED ORDER — FENTANYL CITRATE (PF) 100 MCG/2ML IJ SOLN
INTRAMUSCULAR | Status: AC
  Filled 2015-03-24: qty 2

## 2015-03-24 MED ORDER — MIDAZOLAM HCL 2 MG/2ML IJ SOLN
INTRAMUSCULAR | Status: AC
  Filled 2015-03-24: qty 2

## 2015-03-24 MED ORDER — LIDOCAINE HCL (PF) 1 % IJ SOLN
INTRAMUSCULAR | Status: AC
  Filled 2015-03-24: qty 10

## 2015-03-24 MED ORDER — FENTANYL CITRATE (PF) 100 MCG/2ML IJ SOLN
INTRAMUSCULAR | Status: AC | PRN
  Administered 2015-03-24: 25 ug via INTRAVENOUS
  Administered 2015-03-24: 50 ug via INTRAVENOUS

## 2015-03-24 MED ORDER — LIDOCAINE HCL (PF) 1 % IJ SOLN
INTRAMUSCULAR | Status: AC
  Filled 2015-03-24: qty 30

## 2015-03-24 MED ORDER — GELATIN ABSORBABLE 12-7 MM EX MISC
CUTANEOUS | Status: AC
  Filled 2015-03-24: qty 1

## 2015-03-24 NOTE — Discharge Instructions (Signed)
Liver Biopsy, Care After Refer to this sheet in the next few weeks. These instructions provide you with information on caring for yourself after your procedure. Your health care provider may also give you more specific instructions. Your treatment has been planned according to current medical practices, but problems sometimes occur. Call your health care provider if you have any problems or questions after your procedure. WHAT TO EXPECT AFTER THE PROCEDURE After your procedure, it is typical to have the following:  A small amount of discomfort in the area where the biopsy was done and in the right shoulder or shoulder blade.  A small amount of bruising around the area where the biopsy was done and on the skin over the liver.  Sleepiness and fatigue for the rest of the day. HOME CARE INSTRUCTIONS   Rest at home for 1-2 days or as directed by your health care provider.  Have a friend or family member stay with you for at least 24 hours.  Because of the medicines used during the procedure, you should not do the following things in the first 24 hours:  Drive.  Use machinery.  Be responsible for the care of other people.  Sign legal documents.  Take a bath or shower.  There are many different ways to close and cover an incision, including stitches, skin glue, and adhesive strips. Follow your health care provider's instructions on:  Incision care.  Bandage (dressing) changes and removal.  Incision closure removal.  Do not drink alcohol in the first week.  Do not lift more than 5 pounds or play contact sports for 2 weeks after this test.  Take medicines only as directed by your health care provider. Do not take medicine containing aspirin or non-steroidal anti-inflammatory medicines such as ibuprofen for 1 week after this test.  It is your responsibility to get your test results. SEEK MEDICAL CARE IF:   You have increased bleeding from an incision that results in more than a  small spot of blood.  You have redness, swelling, or increasing pain in any incisions.  You notice a discharge or a bad smell coming from any of your incisions.  You have a fever or chills. SEEK IMMEDIATE MEDICAL CARE IF:   You develop swelling, bloating, or pain in your abdomen.  You become dizzy or faint.  You develop a rash.  You are nauseous or vomit.  You have difficulty breathing, feel short of breath, or feel faint.  You develop chest pain.  You have problems with your speech or vision.  You have trouble balancing or moving your arms or legs.   This information is not intended to replace advice given to you by your health care provider. Make sure you discuss any questions you have with your health care provider.   Document Released: 10/08/2004 Document Revised: 04/11/2014 Document Reviewed: 05/17/2013 Elsevier Interactive Patient Education [employer].

## 2015-03-24 NOTE — Procedures (Signed)
Interventional Radiology Procedure Note  Procedure: Random liver biopsy  Complications: None immediate  Estimated Blood Loss: 0 mL  Recommendations:  - Bedrest x 3 hrs  Signed,  Criselda Peaches, MD

## 2015-03-24 NOTE — Sedation Documentation (Signed)
Patient is resting comfortably. 

## 2015-03-24 NOTE — H&P (Signed)
Chief Complaint: "I'm having a liver biopsy"  Referring Physician(s): Setzer,Terri L  History of Present Illness: Margaret [name] is a 44 y.o. female with history of hepatitis B who presents today for ultrasound-guided random core liver biopsy for further evaluation.  Past Medical History  Diagnosis Date  . Schizophrenia ([place])   . Impulse disorder   . Hepatitis B carrier   . Mild intellectual disability   . Urinary frequency 11/12/2014  . Hematuria 11/12/2014  . IUD (intrauterine device) in place 11/12/2014    Inserted 12/24/12 at [facility]  . Schizophrenia ([place])   . Hypothyroidism   . Tuberculosis     as a child    Past Surgical History  Procedure Laterality Date  . Tracheal surgery    . Tracheostomy    . Arm wound repair / closure      Allergies: Review of patient's allergies indicates no known allergies.  Medications: Prior to Admission medications   Medication Sig Start Date End Date Taking? Authorizing Provider  acetaminophen (TYLENOL) 325 MG suppository Place 325 mg rectally every 4 (four) hours as needed.   Yes Historical Provider, MD  acetaminophen (TYLENOL) 650 MG CR tablet Take 650 mg by mouth every 8 (eight) hours as needed for pain.   Yes Historical Provider, MD  albuterol (PROVENTIL HFA;VENTOLIN HFA) 108 (90 BASE) MCG/ACT inhaler Inhale 1-2 puffs into the lungs every 6 (six) hours as needed for wheezing or shortness of breath.   Yes Historical Provider, MD  cholecalciferol (VITAMIN D) 1000 UNITS tablet Take 2,000 Units by mouth daily.   Yes Historical Provider, MD  cloZAPine (CLOZARIL) 100 MG tablet Take 250 mg by mouth at bedtime.   Yes Historical Provider, MD  Fish Oil-Cholecalciferol (FISH OIL + D3) 1000-1000 MG-UNIT CAPS Take 1 capsule by mouth 2 (two) times daily. 12/24/14  Yes Fransisca Kaufmann Dettinger, MD  FLUoxetine (PROZAC) 20 MG tablet Take 3 tablets (60 mg total) by mouth daily. 03/20/15  Yes Fransisca Kaufmann Dettinger, MD  fluticasone (FLONASE) 50 MCG/ACT nasal spray  Place 2 sprays into the nose 2 (two) times daily. Use only in right nare, not left.   Yes Historical Provider, MD  ibuprofen (ADVIL,MOTRIN) 600 MG tablet Take 600 mg by mouth every 6 (six) hours as needed for mild pain.   Yes Historical Provider, MD  levonorgestrel (MIRENA) 20 MCG/24HR IUD 1 each by Intrauterine route once.   Yes Historical Provider, MD  Multiple Vitamin (MULTIVITAMIN) tablet Take 1 tablet by mouth daily. 12/24/14  Yes Fransisca Kaufmann Dettinger, MD  Skin Protectants, Misc. (EUCERIN) cream Apply 1 application topically as needed for dry skin.   Yes Historical Provider, MD     Family History  Problem Relation Age of Onset  . Adopted: Yes    Social History   Social History  . Marital Status: Single    Spouse Name: N/A  . Number of Children: N/A  . Years of Education: N/A   Social History Main Topics  . Smoking status: Former Smoker -- 1.00 packs/day for 24 years    Types: Cigarettes    Quit date: 11/03/2010  . Smokeless tobacco: Never Used  . Alcohol Use: No  . Drug Use: No  . Sexual Activity: No   Other Topics Concern  . Not on file   Social History Narrative      Review of Systems  Constitutional: Negative for fever and chills.  Respiratory: Negative for shortness of breath.        Occ  cough  Cardiovascular: Negative for chest pain.  Gastrointestinal: Negative for nausea, vomiting and abdominal pain.  Genitourinary: Negative for hematuria and dyspareunia.  Musculoskeletal: Negative for back pain.  Neurological:       Occ HA's    Vital Signs: BP 108/73 mmHg  Pulse 79  Temp(Src) 98 F (36.7 C) (Oral)  Resp 18  Ht 5' 3"  (1.6 m)  Wt 135 lb (61.236 kg)  BMI 23.92 kg/m2  SpO2 100%  LMP 03/23/2015  Physical Exam  Constitutional: She is oriented to person, place, and time. She appears well-developed and well-nourished.  Cardiovascular: Normal rate and regular rhythm.   Pulmonary/Chest: Effort normal and breath sounds normal.  Abdominal: Soft. Bowel  sounds are normal. There is no tenderness.  Musculoskeletal: Normal range of motion. She exhibits no edema.  Neurological: She is alert and oriented to person, place, and time.    Mallampati Score:     Imaging: No results found.  Labs:  CBC:  Recent Labs  11/10/14 1203 03/24/15 1058  WBC 6.5 8.7  HGB 12.7 12.3  HCT 37.7 37.6  PLT 233 158    COAGS:  Recent Labs  03/24/15 1058  INR 1.04    BMP:  Recent Labs  12/18/14 1057 02/19/15 1035  NA 143 141  K 4.5 3.5  CL 101 104  CO2 21 27  GLUCOSE 70 93  BUN 9 9  CALCIUM 9.1 8.9  CREATININE 0.68 0.56  GFRNONAA 108  --   GFRAA 125  --     LIVER FUNCTION TESTS:  Recent Labs  11/10/14 1203 02/19/15 1035  BILITOT 0.6 0.4  AST 13 21  ALT 8 18  ALKPHOS 69 64  PROT 6.2 6.3  ALBUMIN 3.9 4.0    TUMOR MARKERS: No results for input(s): AFPTM, CEA, CA199, CHROMGRNA in the last 8760 hours.  Assessment and Plan: 44 y.o. female with history of hepatitis B who presents today for ultrasound-guided random core liver biopsy for further evaluation.Risks and benefits discussed with the patient including, but not limited to bleeding, infection, damage to adjacent structures or low yield requiring additional tests.All of the patient's questions were answered, patient is agreeable to proceed.Consent signed and in chart.      Thank you for this interesting consult.  I greatly enjoyed meeting Hamsini Verrilli and look forward to participating in their care.  A copy of this report was sent to the requesting provider on this date.  Signed: D. Rowe Robert 03/24/2015, 11:36 AM   I spent a total of 15 minutes in face to face in clinical consultation, greater than 50% of which was counseling/coordinating care for ultrasound-guided random core liver biopsy

## 2015-04-09 ENCOUNTER — Telehealth (INDEPENDENT_AMBULATORY_CARE_PROVIDER_SITE_OTHER): Payer: Self-pay | Admitting: Internal Medicine

## 2015-04-09 ENCOUNTER — Ambulatory Visit (INDEPENDENT_AMBULATORY_CARE_PROVIDER_SITE_OTHER): Payer: Medicare Other | Admitting: Internal Medicine

## 2015-04-09 ENCOUNTER — Encounter (INDEPENDENT_AMBULATORY_CARE_PROVIDER_SITE_OTHER): Payer: Self-pay | Admitting: Internal Medicine

## 2015-04-09 VITALS — BP 94/56 | HR 72 | Temp 98.3°F | Ht 64.0 in | Wt 136.2 lb

## 2015-04-09 DIAGNOSIS — B1910 Unspecified viral hepatitis B without hepatic coma: Secondary | ICD-10-CM | POA: Diagnosis not present

## 2015-04-09 DIAGNOSIS — B169 Acute hepatitis B without delta-agent and without hepatic coma: Secondary | ICD-10-CM

## 2015-04-09 NOTE — Telephone Encounter (Signed)
error 

## 2015-04-09 NOTE — Patient Instructions (Signed)
OV 12 weeks.

## 2015-04-09 NOTE — Progress Notes (Signed)
Subjective:    [name], female    DOB: 10-Aug-1970, 45 y.o.   MRN: [id number]  HPI    HPI Here today for f/u of her Hepatitis B. She is adopted. She lived in [place]. As a child, she had TB and did receive tx. She is Asian descent.  She tells me she has been doing good. She has gained about 7 pounds.Her appetite is good. No weight loss. She denies any abdominal pain.  She usually has a BM x 2 a week. No melena or BRRB. She lives in a  Group home and caregiver is present.  Patient is not sexual active and has IUD in place.    03/26/2015 US Liver biopsy: Patchy, mild sinusoidal dilatation, predominantly central lobular with some areas showing mild periportal dilatation. There aer also scattered dilated portal hepatic veins. The portal areas otherwise do not show significantly increased inflammation. Reticulin stain demonstrates patchy mild increased staining around occasional central veins,. No increased fibrosis is identified with Trichrome stain, and no increased iron is identified with iron stain. The pattern of sinusoidal dilatation is patchy and mild. Sinusoidal dilatation is a non specific finding, and can occasionally be associated with vascular disorders such as portal vein obstruction or portal hypertension  11/27/2014 [place]:  Corresponding Metavir fibrosis score:  F2/F3 Risk of fibrosis: Moderate  11/10/2014 Hep B Surface Antibody negative. Hepatitis B DNA Ultra 2336 He B Surface antigen positive Hep B E Antigen negative. Hep B Surface Antibody Negative. Hep C antibody negative   CBC    Component Value Date/Time   WBC 8.7 03/24/2015 1058   RBC 4.16 03/24/2015 1058   HGB 12.3 03/24/2015 1058   HCT 37.6 03/24/2015 1058   PLT 158 03/24/2015 1058   MCV 90.4 03/24/2015 1058   MCH 29.6 03/24/2015 1058   MCHC 32.7 03/24/2015 1058   RDW 12.6 03/24/2015 1058   LYMPHSABS 1.7 03/24/2015 1058   MONOABS 0.5 03/24/2015 1058   EOSABS 0.1 03/24/2015 1058   BASOSABS 0.1 03/24/2015 1058    CMP Latest Ref Rng 03/24/2015 02/19/2015 12/18/2014  Glucose 65 - 99 mg/dL 100(H) 93 70  BUN 6 - 20 mg/dL 8 9 9   Creatinine 9.37 - 1.00 mg/dL 0.63 0.56 0.68  Sodium 135 - 145 mmol/L 143 141 143  Potassium 3.5 - 5.1 mmol/L 3.9 3.5 4.5  Chloride 101 - 111 mmol/L 107 104 101  CO2 22 - 32 mmol/L 29 27 21   Calcium 8.9 - 10.3 mg/dL 9.2 8.9 9.1  Total Protein 6.5 - 8.1 g/dL 6.4(L) 6.3 -  Total Bilirubin 0.3 - 1.2 mg/dL 0.5 0.4 -  Alkaline Phos 38 - 126 U/L 67 64 -  AST 15 - 41 U/L 21 21 -  ALT 14 - 54 U/L 17 18 -      Review of Systems Past Medical History  Diagnosis Date  . Schizophrenia ([place])   . Impulse disorder   . Hepatitis B carrier   . Mild intellectual disability   . Urinary frequency 11/12/2014  . Hematuria 11/12/2014  . IUD (intrauterine device) in place 11/12/2014    [facility] 12/[facility]/12 [facility]  . Schizophrenia ([place])   . Hypothyroidism   . Tuberculosis     as a child    Past Surgical History  Procedure Laterality Date  . Tracheal surgery    . Tracheostomy    . Arm wound repair / closure      No Known Allergies  Current Outpatient Prescriptions on  File Prior to Visit  Medication Sig Dispense Refill  . acetaminophen (TYLENOL) 325 MG suppository Place 325 mg rectally every 4 (four) hours as needed.    Marland Kitchen acetaminophen (TYLENOL) 650 MG CR tablet Take 650 mg by mouth every 8 (eight) hours as needed for pain.    Marland Kitchen albuterol (PROVENTIL HFA;VENTOLIN HFA) 108 (90 BASE) MCG/ACT inhaler Inhale 1-2 puffs into the lungs every 6 (six) hours as needed for wheezing or shortness of breath.    . cholecalciferol (VITAMIN D) 1000 UNITS tablet Take 2,000 Units by mouth daily.    . cloZAPine (CLOZARIL) 100 MG tablet Take 250 mg by mouth at bedtime.    . Fish Oil-Cholecalciferol (FISH OIL + D3) 1000-1000 MG-UNIT CAPS Take 1 capsule by mouth 2 (two) times daily. 60 capsule 5  . FLUoxetine (PROZAC) 20 MG tablet Take 3 tablets (60 mg total) by mouth daily. 90  tablet 2  . fluticasone (FLONASE) 50 MCG/ACT nasal spray Place 2 sprays into the nose 2 (two) times daily. Use only in right nare, not left.    Marland Kitchen ibuprofen (ADVIL,MOTRIN) 600 MG tablet Take 600 mg by mouth every 6 (six) hours as needed for mild pain.    Marland Kitchen levonorgestrel (MIRENA) 20 MCG/24HR IUD 1 each by Intrauterine route once.    . Multiple Vitamin (MULTIVITAMIN) tablet Take 1 tablet by mouth daily. 30 tablet 5  . Skin Protectants, Misc. (EUCERIN) cream Apply 1 application topically as needed for dry skin.     No current facility-administered medications on file prior to visit.        Objective:   Physical ExamBlood pressure 94/56, pulse 72, temperature 98.3 F (36.8 C), height 5' 4"  (1.626 m), weight 136 lb 3.2 oz (61.78 kg), last menstrual period 03/23/2015.  Alert and oriented. Skin warm and dry. Oral mucosa is moist.   . Sclera anicteric, conjunctivae is pink. Thyroid not enlarged. No cervical lymphadenopathy. Bilaterally wheezes. HR regular rate and rhythm.  Abdomen is soft. Bowel sounds are positive. No hepatomegaly. No abdominal masses felt. No tenderness.  No edema to lower extremities        Assessment & Plan:  Hepatitis. Will treat with Baraclude 0.58m daily  X 3 years once I get the [place] back.  Hep B quaint today.

## 2015-04-14 LAB — HEPATITIS B DNA, ULTRAQUANTITATIVE, PCR
HEPATITIS B DNA: 2830 [IU]/mL — AB (ref <20)
Hepatitis B DNA (Calc): 3.45 Log IU/mL — ABNORMAL HIGH (ref <1.30)

## 2015-04-16 ENCOUNTER — Telehealth (INDEPENDENT_AMBULATORY_CARE_PROVIDER_SITE_OTHER): Payer: Self-pay | Admitting: Internal Medicine

## 2015-04-16 DIAGNOSIS — B1910 Unspecified viral hepatitis B without hepatic coma: Secondary | ICD-10-CM

## 2015-04-16 MED ORDER — ENTECAVIR 0.5 MG PO TABS: 0.5000 mg | ORAL_TABLET | Freq: Every day | ORAL | Status: DC

## 2015-04-16 NOTE — Telephone Encounter (Signed)
Rx sent to her pharmacy 

## 2015-04-20 NOTE — Telephone Encounter (Signed)
error 

## 2015-04-28 ENCOUNTER — Telehealth (INDEPENDENT_AMBULATORY_CARE_PROVIDER_SITE_OTHER): Payer: Self-pay | Admitting: Internal Medicine

## 2015-04-28 NOTE — Telephone Encounter (Signed)
Spoke with [place]. We are going to treat.

## 2015-06-11 DIAGNOSIS — F250 Schizoaffective disorder, bipolar type: Secondary | ICD-10-CM | POA: Diagnosis not present

## 2015-06-22 ENCOUNTER — Other Ambulatory Visit: Payer: Self-pay | Admitting: Family Medicine

## 2015-06-22 NOTE — Telephone Encounter (Signed)
Last seen 02/23/15 Dr Dettinger  Last lipid 12/18/14

## 2015-06-25 ENCOUNTER — Ambulatory Visit (INDEPENDENT_AMBULATORY_CARE_PROVIDER_SITE_OTHER): Payer: Medicare Other | Admitting: Family Medicine

## 2015-06-25 ENCOUNTER — Encounter: Payer: Self-pay | Admitting: Family Medicine

## 2015-06-25 VITALS — BP 106/72 | HR 93 | Temp 98.3°F | Ht 64.0 in | Wt 143.2 lb

## 2015-06-25 DIAGNOSIS — F329 Major depressive disorder, single episode, unspecified: Secondary | ICD-10-CM

## 2015-06-25 DIAGNOSIS — F32A Depression, unspecified: Secondary | ICD-10-CM

## 2015-06-25 NOTE — Assessment & Plan Note (Signed)
Continue current medications. 

## 2015-06-25 NOTE — Progress Notes (Signed)
BP 106/72 mmHg  Pulse 93  Temp(Src) 98.3 F (36.8 C) (Oral)  Ht 5' 4"  (1.626 m)  Wt 143 lb 3.2 oz (64.955 kg)  BMI 24.57 kg/m2  LMP 06/11/2015   Subjective:    [name], female    DOB: 1970-06-22, 45 y.o.   MRN: [id number]  HPI: Margaret [name] is a 45 y.o. female presenting on 06/25/2015 for Depression and Hyperlipidemia   HPI Anxiety and depression recheck Patient is coming in today for an anxiety and depression recheck. She has been doing well on current medication and she saw her psychiatrist as well who is managing the closet. The fluoxetine which we are managing is doing well for her as well. She has up days and down days but for the most part is doing well. She is sleeping well at night and not have any major issues. She denies any suicidal ideation or thoughts of hurting herself and the staff member that helps take care of her says she hasn't had any of those either. She says she is sleeping well at night.  Relevant past medical, surgical, family and social history reviewed and updated as indicated. Interim medical history since our last visit reviewed. Allergies and medications reviewed and updated.  Review of Systems  Constitutional: Negative for fever and chills.  HENT: Negative for congestion, ear discharge and ear pain.   Eyes: Negative for redness and visual disturbance.  Respiratory: Negative for chest tightness and shortness of breath.   Cardiovascular: Negative for chest pain and leg swelling.  Genitourinary: Negative for dysuria and difficulty urinating.  Musculoskeletal: Negative for back pain and gait problem.  Skin: Negative for rash.  Neurological: Negative for light-headedness and headaches.  Psychiatric/Behavioral: Negative for suicidal ideas, behavioral problems, sleep disturbance, self-injury, dysphoric mood and agitation. The patient is not nervous/anxious.   All other systems reviewed and are negative.   Per HPI unless specifically  indicated above     Medication List       This list is accurate as of: 06/25/15  9:58 AM.  Always use your most recent med list.               acetaminophen 325 MG suppository  Commonly known as:  TYLENOL  Place 325 mg rectally every 4 (four) hours as needed.     acetaminophen 650 MG CR tablet  Commonly known as:  TYLENOL  Take 650 mg by mouth every 8 (eight) hours as needed for pain.     albuterol 108 (90 Base) MCG/ACT inhaler  Commonly known as:  PROVENTIL HFA;VENTOLIN HFA  Inhale 1-2 puffs into the lungs every 6 (six) hours as needed for wheezing or shortness of breath.     cholecalciferol 1000 units tablet  Commonly known as:  VITAMIN D  Take 2,000 Units by mouth daily.     cloZAPine 100 MG tablet  Commonly known as:  CLOZARIL  Take 250 mg by mouth at bedtime.     entecavir 0.5 MG tablet  Commonly known as:  BARACLUDE  Take 1 tablet (0.5 mg total) by mouth daily.     eucerin cream  Apply 1 application topically as needed for dry skin.     FISH OIL + D3 1000-1000 MG-UNIT Caps  Take 1 capsule by mouth 2 (two) times daily.     FLUoxetine 20 MG capsule  Commonly known as:  PROZAC  TAKE 3 CAPSULES BY MOUTH ONCE DAILY.     fluticasone 50 MCG/ACT nasal spray  Commonly known as:  FLONASE  Place 2 sprays into the nose 2 (two) times daily. Use only in right nare, not left.     ibuprofen 600 MG tablet  Commonly known as:  ADVIL,MOTRIN  Take 600 mg by mouth every 6 (six) hours as needed for mild pain.     levonorgestrel 20 MCG/24HR IUD  Commonly known as:  MIRENA  1 each by Intrauterine route once.           Objective:    BP 106/72 mmHg  Pulse 93  Temp(Src) 98.3 F (36.8 C) (Oral)  Ht 5' 4"  (1.626 m)  Wt 143 lb 3.2 oz (64.955 kg)  BMI 24.57 kg/m2  LMP 06/11/2015  Wt Readings from Last 3 Encounters:  06/25/15 143 lb 3.2 oz (64.955 kg)  04/09/15 136 lb 3.2 oz (61.78 kg)  03/24/15 135 lb (61.236 kg)    Physical Exam  Constitutional: She is oriented  to person, place, and time. She appears well-developed and well-nourished. No distress.  Eyes: Conjunctivae and EOM are normal. Pupils are equal, round, and reactive to light.  Cardiovascular: Normal rate, regular rhythm, normal heart sounds and intact distal pulses.   No murmur heard. Pulmonary/Chest: Effort normal and breath sounds normal. No respiratory distress. She has no wheezes.  Musculoskeletal: Normal range of motion. She exhibits no edema or tenderness.  Neurological: She is alert and oriented to person, place, and time. Coordination normal.  Skin: Skin is warm and dry. No rash noted. She is not diaphoretic.  Psychiatric: She has a normal mood and affect. Her speech is normal and behavior is normal. Judgment and thought content normal. Cognition and memory are impaired. She expresses no suicidal ideation. She expresses no suicidal plans. She exhibits abnormal recent memory (Patient has abnormal memory issues because of neurological disorders that have been there previously but have not changed.).  Nursing note and vitals reviewed.   Results for orders placed or performed in visit on 04/09/15  Hepatitis B DNA, ultraquantitative, PCR  Result Value Ref Range   Hepatitis B DNA 2830 (H) <20 IU/mL   Hepatitis B DNA (Calc) 3.45 (H) <1.30 Log IU/mL      Assessment & Plan:   Problem List Items Addressed This Visit      Other   Depression - Primary       Follow up plan: Return in about 6 months (around 12/26/2015), or if symptoms worsen or fail to improve, for Recheck cholesterol and wellness exam.  Counseling provided for all of the vaccine components No orders of the defined types were placed in this encounter.    Caryl Pina, MD [facility] 06/25/2015, 9:58 AM

## 2015-07-02 ENCOUNTER — Ambulatory Visit (INDEPENDENT_AMBULATORY_CARE_PROVIDER_SITE_OTHER): Payer: Medicare Other | Admitting: Internal Medicine

## 2015-07-02 ENCOUNTER — Encounter (INDEPENDENT_AMBULATORY_CARE_PROVIDER_SITE_OTHER): Payer: Self-pay | Admitting: Internal Medicine

## 2015-07-02 VITALS — BP 104/82 | HR 64 | Temp 98.2°F | Ht 64.0 in | Wt 145.0 lb

## 2015-07-02 DIAGNOSIS — B161 Acute hepatitis B with delta-agent without hepatic coma: Secondary | ICD-10-CM | POA: Diagnosis not present

## 2015-07-02 LAB — HEPATIC FUNCTION PANEL
ALK PHOS: 57 U/L (ref 33–115)
ALT: 16 U/L (ref 6–29)
AST: 23 U/L (ref 10–30)
Albumin: 3.8 g/dL (ref 3.6–5.1)
BILIRUBIN DIRECT: 0.1 mg/dL (ref <=0.2)
BILIRUBIN TOTAL: 0.6 mg/dL (ref 0.2–1.2)
Indirect Bilirubin: 0.5 mg/dL (ref 0.2–1.2)
Total Protein: 5.6 g/dL — ABNORMAL LOW (ref 6.1–8.1)

## 2015-07-02 LAB — CBC WITH DIFFERENTIAL/PLATELET
BASOS PCT: 1 % (ref 0–1)
Basophils Absolute: 0.1 10*3/uL (ref 0.0–0.1)
Eosinophils Absolute: 0.1 10*3/uL (ref 0.0–0.7)
Eosinophils Relative: 2 % (ref 0–5)
HCT: 37.5 % (ref 36.0–46.0)
HEMOGLOBIN: 12.2 g/dL (ref 12.0–15.0)
LYMPHS PCT: 30 % (ref 12–46)
Lymphs Abs: 1.9 10*3/uL (ref 0.7–4.0)
MCH: 29.5 pg (ref 26.0–34.0)
MCHC: 32.5 g/dL (ref 30.0–36.0)
MCV: 90.6 fL (ref 78.0–100.0)
MONO ABS: 0.3 10*3/uL (ref 0.1–1.0)
MPV: 9.3 fL (ref 8.6–12.4)
Monocytes Relative: 5 % (ref 3–12)
NEUTROS ABS: 3.8 10*3/uL (ref 1.7–7.7)
NEUTROS PCT: 62 % (ref 43–77)
Platelets: 180 10*3/uL (ref 150–400)
RBC: 4.14 MIL/uL (ref 3.87–5.11)
RDW: 13.4 % (ref 11.5–15.5)
WBC: 6.2 10*3/uL (ref 4.0–10.5)

## 2015-07-02 NOTE — Progress Notes (Signed)
Subjective:    [name], female    DOB: 11/11/1970, 45 y.o.   MRN: [id number]  HPIHere today for f/u of her Hepatitis B. She was last  Seen in January of this year.  She is adopted. She lived in [place]. As a child, she had TB and did receive tx. She is Asian descent.  She started [facility] 1/112/2017. She will be maintained on Baraclude x 68yr.  She tells me she has been doing good. She has gained about 9 pounds her last visit. .Marland Kitchener appetite is good.   She denies any abdominal pain. ?She feels good.  She usually has a BM x 2 a week. No melena or BRRB. She lives in a Group home and caregiver is present.  Patient is not sexual active and has IUD in place. Her last periods was 06/03/2015 and caregiver reports it was normal.    04/09/2015 Hep B quaint 2830  03/26/2015 [place]: Patchy, mild sinusoidal dilatation, predominantly central lobular with some areas showing mild periportal dilatation. There aer also scattered dilated portal hepatic veins. The portal areas otherwise do not show significantly increased inflammation. Reticulin stain demonstrates patchy mild increased staining around occasional central veins,. No increased fibrosis is identified with Trichrome stain, and no increased iron is identified with iron stain. The pattern of sinusoidal dilatation is patchy and mild. Sinusoidal dilatation is a non specific finding, and can occasionally be associated with vascular disorders such as portal vein obstruction or portal hypertension  11/27/2014 [place]:  Corresponding Metavir fibrosis score: F2/F3 Risk of fibrosis: Moderate  11/10/2014 Hep B Surface Antibody negative. Hepatitis B DNA Ultra 2336 He B Surface antigen positive Hep B E Antigen negative. Hep B Surface Antibody Negative. Hep C antibody negative   Review of Systems Past Medical History  Diagnosis Date  . [place]   . Impulse disorder   . Hepatitis B carrier   . Mild intellectual  disability   . Urinary frequency 11/12/2014  . Hematuria 11/12/2014  . IUD (intrauterine device) in place 11/12/2014    [facility] . [place] ([place]   . Hypothyroidism   . Tuberculosis     as a child    Past Surgical History  Procedure Laterality Date  . Tracheal surgery    . Tracheostomy    . Arm wound repair / closure      No Known Allergies  Current Outpatient Prescriptions on File Prior to Visit  Medication Sig Dispense Refill  . acetaminophen (TYLENOL) 325 MG suppository Place 325 mg rectally every 4 (four) hours as needed.    .Marland Kitchenacetaminophen (TYLENOL) 650 MG CR tablet Take 650 mg by mouth every 8 (eight) hours as needed for pain.    .Marland Kitchenalbuterol (PROVENTIL HFA;VENTOLIN HFA) 108 (90 BASE) MCG/ACT inhaler Inhale 1-2 puffs into the lungs every 6 (six) hours as needed for wheezing or shortness of breath.    . cholecalciferol (VITAMIN D) 1000 UNITS tablet Take 2,000 Units by mouth daily.    . cloZAPine (CLOZARIL) 100 MG tablet Take 250 mg by mouth at bedtime.    .Marland Kitchenentecavir (BARACLUDE) 0.5 MG tablet Take 1 tablet (0.5 mg total) by mouth daily. 30 tablet 11  . Fish Oil-Cholecalciferol (FISH OIL + D3) 1000-1000 MG-UNIT CAPS Take 1 capsule by mouth 2 (two) times daily. 60 capsule 5  . FLUoxetine (PROZAC) 20 MG capsule TAKE 3 CAPSULES BY MOUTH ONCE DAILY. 90 capsule 0  . fluticasone (FLONASE) 50 MCG/ACT  nasal spray Place 2 sprays into the nose 2 (two) times daily. Use only in right nare, not left.    Marland Kitchen ibuprofen (ADVIL,MOTRIN) 600 MG tablet Take 600 mg by mouth every 6 (six) hours as needed for mild pain.    Marland Kitchen levonorgestrel (MIRENA) 20 MCG/24HR IUD 1 each by Intrauterine route once.    . Skin Protectants, Misc. (EUCERIN) cream Apply 1 application topically as needed for dry skin.     No current facility-administered medications on file prior to visit.        Objective:   Physical Exam   Blood pressure 104/82, pulse 64, temperature 98.2 F (36.8 C), height 5' 4"   (1.626 m), weight 145 lb (65.772 kg), last menstrual period 06/11/2015. Alert and oriented. Skin warm and dry. Oral mucosa is moist.   . Sclera anicteric, conjunctivae is pink. Thyroid not enlarged. No cervical lymphadenopathy. Lungs clear. Heart regular rate and rhythm.  Abdomen is soft. Bowel sounds are positive. No hepatomegaly. No abdominal masses felt. No tenderness.  No edema to lower extremities.        Assessment & Plan:  Hepatitis B. She seems to be doing well. She has gained weight. She will go to lab for CBC with diff, Hepatic function, and Hep B quaint.  OV in 3 months.

## 2015-07-02 NOTE — Patient Instructions (Signed)
Labs today. OV 3 months.

## 2015-07-03 LAB — HEPATITIS B DNA, ULTRAQUANTITATIVE, PCR: Hepatitis B DNA: <20 IU/mL (ref <20)

## 2015-07-14 ENCOUNTER — Other Ambulatory Visit: Payer: Self-pay | Admitting: *Deleted

## 2015-07-14 DIAGNOSIS — Z5181 Encounter for therapeutic drug level monitoring: Secondary | ICD-10-CM | POA: Diagnosis not present

## 2015-07-14 DIAGNOSIS — Z79899 Other long term (current) drug therapy: Secondary | ICD-10-CM | POA: Diagnosis not present

## 2015-07-14 MED ORDER — FLUOXETINE HCL 20 MG PO CAPS: ORAL_CAPSULE | ORAL | Status: DC

## 2015-07-14 MED ORDER — FISH OIL + D3 1000-1000 MG-UNIT PO CAPS: 1.0000 | ORAL_CAPSULE | Freq: Two times a day (BID) | ORAL | Status: DC

## 2015-07-14 NOTE — Addendum Note (Signed)
Addended by: Thana Ates on: 07/14/2015 04:48 PM   Modules accepted: Orders

## 2015-07-20 ENCOUNTER — Telehealth: Payer: Self-pay

## 2015-07-20 NOTE — Telephone Encounter (Signed)
[place] contacted [place] needing a prescription for a multivitamin.  The patient lives in a group home and therefore they require a prescription for this.  Verbal order given for multivitamin, one per day #30 with 6 refills.

## 2015-08-11 ENCOUNTER — Other Ambulatory Visit: Payer: Self-pay | Admitting: Family Medicine

## 2015-08-11 DIAGNOSIS — Z5181 Encounter for therapeutic drug level monitoring: Secondary | ICD-10-CM | POA: Diagnosis not present

## 2015-08-11 DIAGNOSIS — Z79899 Other long term (current) drug therapy: Secondary | ICD-10-CM | POA: Diagnosis not present

## 2015-08-12 ENCOUNTER — Other Ambulatory Visit: Payer: Self-pay | Admitting: Family Medicine

## 2015-09-09 DIAGNOSIS — Z79899 Other long term (current) drug therapy: Secondary | ICD-10-CM | POA: Diagnosis not present

## 2015-09-10 ENCOUNTER — Other Ambulatory Visit: Payer: Self-pay | Admitting: Family Medicine

## 2015-09-11 DIAGNOSIS — F250 Schizoaffective disorder, bipolar type: Secondary | ICD-10-CM | POA: Diagnosis not present

## 2015-09-30 ENCOUNTER — Ambulatory Visit (INDEPENDENT_AMBULATORY_CARE_PROVIDER_SITE_OTHER): Payer: Medicare Other | Admitting: Internal Medicine

## 2015-09-30 ENCOUNTER — Encounter (INDEPENDENT_AMBULATORY_CARE_PROVIDER_SITE_OTHER): Payer: Self-pay | Admitting: Internal Medicine

## 2015-09-30 VITALS — BP 90/48 | HR 72 | Temp 98.0°F | Ht 64.0 in | Wt 142.7 lb

## 2015-09-30 DIAGNOSIS — B169 Acute hepatitis B without delta-agent and without hepatic coma: Secondary | ICD-10-CM | POA: Diagnosis not present

## 2015-09-30 DIAGNOSIS — B1910 Unspecified viral hepatitis B without hepatic coma: Secondary | ICD-10-CM

## 2015-09-30 LAB — HEPATIC FUNCTION PANEL
ALT: 11 U/L (ref 6–29)
AST: 16 U/L (ref 10–30)
Albumin: 4.3 g/dL (ref 3.6–5.1)
Alkaline Phosphatase: 64 U/L (ref 33–115)
BILIRUBIN DIRECT: 0.1 mg/dL (ref <=0.2)
BILIRUBIN INDIRECT: 0.6 mg/dL (ref 0.2–1.2)
TOTAL PROTEIN: 6.4 g/dL (ref 6.1–8.1)
Total Bilirubin: 0.7 mg/dL (ref 0.2–1.2)

## 2015-09-30 LAB — CBC WITH DIFFERENTIAL/PLATELET
Basophils Absolute: 65 cells/uL (ref 0–200)
Basophils Relative: 1 %
EOS ABS: 65 {cells}/uL (ref 15–500)
Eosinophils Relative: 1 %
HEMATOCRIT: 39.7 % (ref 35.0–45.0)
HEMOGLOBIN: 13.2 g/dL (ref 11.7–15.5)
LYMPHS ABS: 1820 {cells}/uL (ref 850–3900)
Lymphocytes Relative: 28 %
MCH: 29.9 pg (ref 27.0–33.0)
MCHC: 33.2 g/dL (ref 32.0–36.0)
MCV: 89.8 fL (ref 80.0–100.0)
MONO ABS: 260 {cells}/uL (ref 200–950)
MPV: 9 fL (ref 7.5–12.5)
Monocytes Relative: 4 %
NEUTROS PCT: 66 %
Neutro Abs: 4290 cells/uL (ref 1500–7800)
Platelets: 192 10*3/uL (ref 140–400)
RBC: 4.42 MIL/uL (ref 3.80–5.10)
RDW: 12.8 % (ref 11.0–15.0)
WBC: 6.5 10*3/uL (ref 3.8–10.8)

## 2015-09-30 NOTE — Patient Instructions (Signed)
OV in 6 months.

## 2015-09-30 NOTE — Progress Notes (Signed)
Subjective:    [name], female    DOB: 13-Jul-1970, 45 y.o.   MRN: [id number]  HPI   HPIHere today for f/u of her Hepatitis B. She was last Seen inMarchof this year. She is adopted. She lived in [place]. As a child, she had TB and did receive tx. She is Asian descent. She started [facility] 04/16/2015. She will be maintained on Baraclude x 3yr.  She tells me she has been doing good. She has lost about 2 pounds since her lat visit.   .Marland Kitchener appetite is good.  She denies any abdominal pain.   She usually has a BM daily. No melena or BRRB. She lives in a Group home and caregiver is present.  Patient is not sexual active and has IUD in place. Her last periods was 06/03/2015 and caregiver reports it was normal.  For the most part she feels good. She exercises by walking around the house and outside.   07/02/2015 Hep C quaint undetected. CBC    Component Value Date/Time   WBC 6.2 07/02/2015 1042   RBC 4.14 07/02/2015 1042   HGB 12.2 07/02/2015 1042   HCT 37.5 07/02/2015 1042   PLT 180 07/02/2015 1042   MCV 90.6 07/02/2015 1042   MCH 29.5 07/02/2015 1042   MCHC 32.5 07/02/2015 1042   RDW 13.4 07/02/2015 1042   LYMPHSABS 1.9 07/02/2015 1042   MONOABS 0.3 07/02/2015 1042   EOSABS 0.1 07/02/2015 1042   BASOSABS 0.1 07/02/2015 1042   Hepatic Function Latest Ref Rng 07/02/2015 03/24/2015 02/19/2015  Total Protein 6.1 - 8.1 g/dL 5.6(L) 6.4(L) 6.3  Albumin 3.6 - 5.1 g/dL 3.8 3.6 4.0  AST 10 - 30 U/L 23 21 21   ALT 6 - 29 U/L 16 17 18   Alk Phosphatase 33 - 115 U/L 57 67 64  Total Bilirubin 0.2 - 1.2 mg/dL 0.6 0.5 0.4  Bilirubin, Direct <=0.2 mg/dL 0.1 - -     04/09/2015 Hep B quaint 2830  03/26/2015 [place]: Patchy, mild sinusoidal dilatation, predominantly central lobular with some areas showing mild periportal dilatation. There aer also scattered dilated portal hepatic veins. The portal areas otherwise do not show significantly increased inflammation.  Reticulin stain demonstrates patchy mild increased staining around occasional central veins,. No increased fibrosis is identified with Trichrome stain, and no increased iron is identified with iron stain. The pattern of sinusoidal dilatation is patchy and mild. Sinusoidal dilatation is a non specific finding, and can occasionally be associated with vascular disorders such as portal vein obstruction or portal hypertension  11/27/2014 [place]:  Corresponding Metavir fibrosis score: F2/F3 Risk of fibrosis: Moderate  11/10/2014 Hep B Surface Antibody negative. Hepatitis B DNA Ultra 2336 He B Surface antigen positive Hep B E Antigen negative. Hep B Surface Antibody Negative. Hep C antibody negative   Review of Systems Past Medical History  Diagnosis Date  . [place]ia ([place]   . Impulse disorder   . Hepatitis B carrier   . Mild intellectual disability   . Urinary frequency 11/12/2014  . Hematuria 11/12/2014  . IUD (intrauterine device) in place 11/12/2014    [facility] . [place]   . Hypothyroidism   . Tuberculosis     as a child    Past Surgical History  Procedure Laterality Date  . Tracheal surgery    . Tracheostomy    . Arm wound repair / closure      No Known Allergies  Current Outpatient Prescriptions  on File Prior to Visit  Medication Sig Dispense Refill  . acetaminophen (TYLENOL) 325 MG suppository Place 325 mg rectally every 4 (four) hours as needed.    Marland Kitchen acetaminophen (TYLENOL) 650 MG CR tablet Take 650 mg by mouth every 8 (eight) hours as needed for pain.    Marland Kitchen albuterol (PROVENTIL HFA;VENTOLIN HFA) 108 (90 BASE) MCG/ACT inhaler Inhale 1-2 puffs into the lungs every 6 (six) hours as needed for wheezing or shortness of breath.    . cholecalciferol (VITAMIN D) 1000 UNITS tablet Take 2,000 Units by mouth daily.    . cloZAPine (CLOZARIL) 100 MG tablet Take 250 mg by mouth at bedtime.    Marland Kitchen entecavir (BARACLUDE) 0.5 MG tablet Take 1 tablet (0.5 mg  total) by mouth daily. 30 tablet 11  . Fish Oil-Cholecalciferol (FISH OIL + D3) 1000-1000 MG-UNIT CAPS Take 1 capsule by mouth 2 (two) times daily. 60 capsule 2  . FLUoxetine (PROZAC) 20 MG capsule TAKE 3 CAPSULES BY MOUTH ONCE DAILY. 90 capsule 2  . fluticasone (FLONASE) 50 MCG/ACT nasal spray SPRAY 2 SPRAYS IN RIGHT NOSTRIL ONLY TWICE DAILY. 16 g 2  . ibuprofen (ADVIL,MOTRIN) 600 MG tablet Take 600 mg by mouth every 6 (six) hours as needed for mild pain.    Marland Kitchen levonorgestrel (MIRENA) 20 MCG/24HR IUD 1 each by Intrauterine route once.    . Skin Protectants, Misc. (EUCERIN) cream Apply 1 application topically as needed for dry skin.     No current facility-administered medications on file prior to visit.        Objective:   Physical Exam Blood pressure 90/48, pulse 72, temperature 98 F (36.7 C), height 5' 4"  (1.626 m), weight 142 lb 11.2 oz (64.728 kg). Alert and oriented. Skin warm and dry. Oral mucosa is moist.   . Sclera anicteric, conjunctivae is pink. Thyroid not enlarged. No cervical lymphadenopathy. Bilateral wheezes.  Heart regular rate and rhythm.  Abdomen is soft. Bowel sounds are positive. No hepatomegaly. No abdominal masses felt. No tenderness.  No edema to lower extremities.          Assessment & Plan:  Hepatitis B. She is doing well. Viral ct undetected. Will get a CBC, Hepatic and Hep B quaint OV in 6 months.

## 2015-10-01 LAB — HEPATITIS B DNA, ULTRAQUANTITATIVE, PCR: Hepatitis B DNA (Calc): <1.3 Log IU/mL (ref <1.30)

## 2015-10-07 ENCOUNTER — Telehealth (INDEPENDENT_AMBULATORY_CARE_PROVIDER_SITE_OTHER): Payer: Self-pay | Admitting: Internal Medicine

## 2015-10-07 NOTE — Telephone Encounter (Signed)
error 

## 2015-10-09 DIAGNOSIS — Z79899 Other long term (current) drug therapy: Secondary | ICD-10-CM | POA: Diagnosis not present

## 2015-10-09 DIAGNOSIS — Z5181 Encounter for therapeutic drug level monitoring: Secondary | ICD-10-CM | POA: Diagnosis not present

## 2015-10-22 ENCOUNTER — Other Ambulatory Visit: Payer: Self-pay

## 2015-10-22 MED ORDER — FISH OIL + D3 1000-1000 MG-UNIT PO CAPS: 1.0000 | ORAL_CAPSULE | Freq: Two times a day (BID) | ORAL | Status: DC

## 2015-11-11 DIAGNOSIS — F250 Schizoaffective disorder, bipolar type: Secondary | ICD-10-CM | POA: Diagnosis not present

## 2015-11-11 DIAGNOSIS — Z79899 Other long term (current) drug therapy: Secondary | ICD-10-CM | POA: Diagnosis not present

## 2015-11-11 DIAGNOSIS — Z5181 Encounter for therapeutic drug level monitoring: Secondary | ICD-10-CM | POA: Diagnosis not present

## 2015-11-23 ENCOUNTER — Other Ambulatory Visit: Payer: Self-pay | Admitting: Family Medicine

## 2015-12-09 DIAGNOSIS — Z5181 Encounter for therapeutic drug level monitoring: Secondary | ICD-10-CM | POA: Diagnosis not present

## 2015-12-09 DIAGNOSIS — Z79899 Other long term (current) drug therapy: Secondary | ICD-10-CM | POA: Diagnosis not present

## 2015-12-16 ENCOUNTER — Other Ambulatory Visit: Payer: Self-pay | Admitting: Family Medicine

## 2015-12-16 NOTE — Telephone Encounter (Signed)
appt set for 9/27

## 2015-12-30 ENCOUNTER — Ambulatory Visit (INDEPENDENT_AMBULATORY_CARE_PROVIDER_SITE_OTHER): Payer: Medicare Other | Admitting: Family Medicine

## 2015-12-30 ENCOUNTER — Encounter: Payer: Self-pay | Admitting: Family Medicine

## 2015-12-30 VITALS — BP 102/72 | HR 95 | Temp 98.4°F | Ht 64.0 in | Wt 145.2 lb

## 2015-12-30 DIAGNOSIS — Z23 Encounter for immunization: Secondary | ICD-10-CM

## 2015-12-30 DIAGNOSIS — E785 Hyperlipidemia, unspecified: Secondary | ICD-10-CM | POA: Diagnosis not present

## 2015-12-30 DIAGNOSIS — Z131 Encounter for screening for diabetes mellitus: Secondary | ICD-10-CM

## 2015-12-30 DIAGNOSIS — R350 Frequency of micturition: Secondary | ICD-10-CM | POA: Diagnosis not present

## 2015-12-30 LAB — URINALYSIS, COMPLETE
BILIRUBIN UA: NEGATIVE
Glucose, UA: NEGATIVE
KETONES UA: NEGATIVE
LEUKOCYTES UA: NEGATIVE
NITRITE UA: NEGATIVE
Protein, UA: NEGATIVE
RBC UA: NEGATIVE
SPEC GRAV UA: 1.02 (ref 1.005–1.030)
Urobilinogen, Ur: 1 mg/dL (ref 0.2–1.0)
pH, UA: 7 (ref 5.0–7.5)

## 2015-12-30 LAB — MICROSCOPIC EXAMINATION
Bacteria, UA: NONE SEEN
RBC, UA: NONE SEEN /hpf (ref 0–?)

## 2015-12-30 NOTE — Progress Notes (Signed)
BP 102/72   Pulse 95   Temp 98.4 F (36.9 C) (Oral)   Ht _0  (1.626 m)   Wt 145 lb 4 oz (65.9 kg)   BMI 24.93 kg/m    Subjective:    [name], female    DOB: 05-15-1970, 45 y.o.   MRN: [id number]  HPI: Margaret [name] is a 45 y.o. female presenting on 12/30/2015 for Hyperlipidemia (6 month followup); Depression; and Urinary Tract Infection (frequent urination; staff would like to rule out UTI)   HPI Urinary frequency Patient comes in today because she is having urinary frequency and often urinating on herself. She will have to go sometimes every 15-20 minutes. She does have a lot of psychiatric issues going on and sees a neuropsychiatrist and there is some concern that it may be correlated to this but psychiatrist wanted [place] to check a urinalysis to make sure she does not have any urinary issues and he would also like [place] to check an ammonia level she denies any burning when she urinates or abdominal pain or flank pain. She denies any fevers or chills. This is has been gradually increasing over the past few months.  Hyperlipidemia recheck Patient is coming in for cholesterol recheck as well. She is due for her labs. She denies any issues with her medication. She is currently taking fish oils.  Relevant past medical, surgical, family and social history reviewed and updated as indicated. Interim medical history since our last visit reviewed. Allergies and medications reviewed and updated.  Review of Systems  Constitutional: Negative for chills and fever.  HENT: Negative for congestion, ear discharge and ear pain.   Eyes: Negative for redness and visual disturbance.  Respiratory: Negative for chest tightness and shortness of breath.   Cardiovascular: Negative for chest pain and leg swelling.  Genitourinary: Positive for frequency and urgency. Negative for decreased urine volume, difficulty urinating, dysuria, flank pain, hematuria, vaginal bleeding, vaginal discharge  and vaginal pain.  Musculoskeletal: Negative for back pain and gait problem.  Skin: Negative for rash.  Neurological: Negative for light-headedness and headaches.  Psychiatric/Behavioral: Negative for agitation and behavioral problems.  All other systems reviewed and are negative.   Per HPI unless specifically indicated above     Medication List       Accurate as of 12/30/15 10:09 AM. Always use your most recent med list.          acetaminophen 650 MG CR tablet Commonly known as:  TYLENOL Take 650 mg by mouth every 8 (eight) hours as needed for pain.   albuterol 108 (90 Base) MCG/ACT inhaler Commonly known as:  PROVENTIL HFA;VENTOLIN HFA Inhale 1-2 puffs into the lungs every 6 (six) hours as needed for wheezing or shortness of breath.   cholecalciferol 1000 units tablet Commonly known as:  VITAMIN D Take 2,000 Units by mouth daily.   cloZAPine 100 MG tablet Commonly known as:  CLOZARIL Take 250 mg by mouth at bedtime.   entecavir 0.5 MG tablet Commonly known as:  BARACLUDE Take 1 tablet (0.5 mg total) by mouth daily.   eucerin cream Apply 1 application topically as needed for dry skin.   FISH OIL + D3 1000-1000 MG-UNIT Caps Take 1 capsule by mouth 2 (two) times daily.   FLUoxetine 20 MG capsule Commonly known as:  PROZAC TAKE 3 CAPSULES (60MG) BY MOUTH ONCE DAILY.   fluticasone 50 MCG/ACT nasal spray Commonly known as:  FLONASE SPRAY 2 SPRAYS IN RIGHT NOSTRIL ONLY TWICE  DAILY.   ibuprofen 600 MG tablet Commonly known as:  ADVIL,MOTRIN Take 600 mg by mouth every 6 (six) hours as needed for mild pain.   levonorgestrel 20 MCG/24HR IUD Commonly known as:  MIRENA 1 each by Intrauterine route once.          Objective:    BP 102/72   Pulse 95   Temp 98.4 F (36.9 C) (Oral)   Ht _0  (1.626 m)   Wt 145 lb 4 oz (65.9 kg)   BMI 24.93 kg/m   Wt Readings from Last 3 Encounters:  12/30/15 145 lb 4 oz (65.9 kg)  09/30/15 142 lb 11.2 oz (64.7 kg)    07/02/15 145 lb (65.8 kg)    Physical Exam  Constitutional: She is oriented to person, place, and time. She appears well-developed and well-nourished. No distress.  Eyes: Conjunctivae are normal.  Cardiovascular: Normal rate, regular rhythm, normal heart sounds and intact distal pulses.   No murmur heard. Pulmonary/Chest: Effort normal and breath sounds normal. No respiratory distress. She has no wheezes.  Abdominal: Soft. Bowel sounds are normal. She exhibits no distension. There is no tenderness. There is no rebound, no guarding and no CVA tenderness.  Musculoskeletal: Normal range of motion. She exhibits no edema or tenderness.  Neurological: She is alert and oriented to person, place, and time. Coordination normal.  Skin: Skin is warm and dry. No rash noted. She is not diaphoretic.  Psychiatric: She has a normal mood and affect. Her behavior is normal.  Nursing note and vitals reviewed.     Assessment & Plan:   Problem List Items Addressed This Visit      Other   Urinary frequency - Primary   Relevant Orders   Urinalysis, Complete   CBC with Differential/Platelet   CMP14+EGFR   Ammonia   Hyperlipidemia   Relevant Orders   Lipid panel    Other Visit Diagnoses    Diabetes mellitus screening       Relevant Orders   CMP14+EGFR   Encounter for immunization       Relevant Orders   Flu Vaccine QUAD 36+ mos IM (Completed)       Follow up plan: Return in about 6 months (around 06/28/2016), or if symptoms worsen or fail to improve, for Physical and recheck.  Counseling provided for all of the vaccine components Orders Placed This Encounter  Procedures  . Urinalysis, Complete  . CBC with Differential/Platelet  . CMP14+EGFR  . Lipid panel  . Ammonia    Caryl Pina, MD [facility] 12/30/2015, 10:09 AM

## 2015-12-31 LAB — CMP14+EGFR
ALBUMIN: 4.1 g/dL (ref 3.5–5.5)
ALK PHOS: 70 IU/L (ref 39–117)
ALT: 11 IU/L (ref 0–32)
AST: 17 IU/L (ref 0–40)
Albumin/Globulin Ratio: 2 (ref 1.2–2.2)
BILIRUBIN TOTAL: 0.3 mg/dL (ref 0.0–1.2)
BUN / CREAT RATIO: 10 (ref 9–23)
BUN: 7 mg/dL (ref 6–24)
CO2: 25 mmol/L (ref 18–29)
CREATININE: 0.68 mg/dL (ref 0.57–1.00)
Calcium: 9.1 mg/dL (ref 8.7–10.2)
Chloride: 100 mmol/L (ref 96–106)
GFR calc Af Amer: 124 mL/min/{1.73_m2} (ref >59)
GFR calc non Af Amer: 107 mL/min/{1.73_m2} (ref >59)
GLUCOSE: 92 mg/dL (ref 65–99)
Globulin, Total: 2.1 g/dL (ref 1.5–4.5)
POTASSIUM: 4 mmol/L (ref 3.5–5.2)
Sodium: 142 mmol/L (ref 134–144)
Total Protein: 6.2 g/dL (ref 6.0–8.5)

## 2015-12-31 LAB — CBC WITH DIFFERENTIAL/PLATELET
BASOS ABS: 0.1 10*3/uL (ref 0.0–0.2)
Basos: 1 %
EOS (ABSOLUTE): 0.1 10*3/uL (ref 0.0–0.4)
Eos: 1 %
Hematocrit: 39.6 % (ref 34.0–46.6)
Hemoglobin: 13.1 g/dL (ref 11.1–15.9)
Immature Grans (Abs): 0 10*3/uL (ref 0.0–0.1)
Immature Granulocytes: 0 %
LYMPHS ABS: 1.7 10*3/uL (ref 0.7–3.1)
Lymphs: 17 %
MCH: 30.2 pg (ref 26.6–33.0)
MCHC: 33.1 g/dL (ref 31.5–35.7)
MCV: 91 fL (ref 79–97)
MONOCYTES: 5 %
MONOS ABS: 0.4 10*3/uL (ref 0.1–0.9)
NEUTROS ABS: 7.4 10*3/uL — AB (ref 1.4–7.0)
Neutrophils: 76 %
PLATELETS: 250 10*3/uL (ref 150–379)
RBC: 4.34 x10E6/uL (ref 3.77–5.28)
RDW: 13.4 % (ref 12.3–15.4)
WBC: 9.7 10*3/uL (ref 3.4–10.8)

## 2015-12-31 LAB — LIPID PANEL
CHOL/HDL RATIO: 3.1 ratio (ref 0.0–4.4)
Cholesterol, Total: 148 mg/dL (ref 100–199)
HDL: 48 mg/dL (ref >39)
LDL Calculated: 67 mg/dL (ref 0–99)
Triglycerides: 164 mg/dL — ABNORMAL HIGH (ref 0–149)
VLDL CHOLESTEROL CAL: 33 mg/dL (ref 5–40)

## 2015-12-31 LAB — AMMONIA: AMMONIA: 24 ug/dL (ref 19–87)

## 2016-01-13 ENCOUNTER — Other Ambulatory Visit: Payer: Self-pay | Admitting: Family Medicine

## 2016-01-19 ENCOUNTER — Other Ambulatory Visit: Payer: Self-pay | Admitting: Family Medicine

## 2016-01-20 DIAGNOSIS — S9001XA Contusion of right ankle, initial encounter: Secondary | ICD-10-CM | POA: Diagnosis not present

## 2016-01-20 DIAGNOSIS — M25571 Pain in right ankle and joints of right foot: Secondary | ICD-10-CM | POA: Diagnosis not present

## 2016-01-20 DIAGNOSIS — S93491A Sprain of other ligament of right ankle, initial encounter: Secondary | ICD-10-CM | POA: Diagnosis not present

## 2016-02-09 DIAGNOSIS — F250 Schizoaffective disorder, bipolar type: Secondary | ICD-10-CM | POA: Diagnosis not present

## 2016-03-07 ENCOUNTER — Other Ambulatory Visit (INDEPENDENT_AMBULATORY_CARE_PROVIDER_SITE_OTHER): Payer: Self-pay | Admitting: Internal Medicine

## 2016-03-07 DIAGNOSIS — B1910 Unspecified viral hepatitis B without hepatic coma: Secondary | ICD-10-CM

## 2016-03-22 ENCOUNTER — Ambulatory Visit (INDEPENDENT_AMBULATORY_CARE_PROVIDER_SITE_OTHER): Payer: Medicare Other | Admitting: Internal Medicine

## 2016-03-22 ENCOUNTER — Encounter (INDEPENDENT_AMBULATORY_CARE_PROVIDER_SITE_OTHER): Payer: Self-pay | Admitting: Internal Medicine

## 2016-03-22 VITALS — BP 100/62 | HR 80 | Temp 97.7°F | Ht 63.0 in | Wt 142.6 lb

## 2016-03-22 DIAGNOSIS — B181 Chronic viral hepatitis B without delta-agent: Secondary | ICD-10-CM | POA: Diagnosis not present

## 2016-03-22 DIAGNOSIS — B1910 Unspecified viral hepatitis B without hepatic coma: Secondary | ICD-10-CM | POA: Diagnosis not present

## 2016-03-22 NOTE — Patient Instructions (Signed)
Labs today. OV in 6 months.

## 2016-03-22 NOTE — Progress Notes (Signed)
Subjective:    [name], female    DOB: 1970-04-30, 45 y.o.   MRN: [id number]  HPI weight in June 142. Today her weight is 142.6. Not s  HPIHere today for f/u of her Hepatitis B. She was last Seen in June of this year. She is adopted. She lived in [place]. As a child, she had TB and did receive tx. She is Asian descent. She started [facility] 04/16/2015. She will be maintained on Baraclude x 61yr.  She tells me she has been doing good. Her appetite is great.    She denies any abdominal pain.  No rashes or joint pain.  She usually has a BM daily. No melena or BRRB. She lives in a Group home and caregiver is present.  Patient is not sexual active and has IUD in place. Her   normal. She exercises by walking around the house and outside.   07/02/2015 Hep C quaint undetected. 09/30/2015 Hep B DNA: not detected.  Hepatic Function Panel     Component Value Date/Time   PROT 6.2 12/30/2015 1024   ALBUMIN 4.1 12/30/2015 1024   AST 17 12/30/2015 1024   ALT 11 12/30/2015 1024   ALKPHOS 70 12/30/2015 1024   BILITOT 0.3 12/30/2015 1024   BILIDIR 0.1 09/30/2015 1019   IBILI 0.6 09/30/2015 1019   CBC    Component Value Date/Time   WBC 9.7 12/30/2015 1024   WBC 6.5 09/30/2015 1019   RBC 4.34 12/30/2015 1024   RBC 4.42 09/30/2015 1019   HGB 13.2 09/30/2015 1019   HCT 39.6 12/30/2015 1024   PLT 250 12/30/2015 1024   MCV 91 12/30/2015 1024   MCH 30.2 12/30/2015 1024   MCH 29.9 09/30/2015 1019   MCHC 33.1 12/30/2015 1024   MCHC 33.2 09/30/2015 1019   RDW 13.4 12/30/2015 1024   LYMPHSABS 1.7 12/30/2015 1024   MONOABS 260 09/30/2015 1019   EOSABS 0.1 12/30/2015 1024   BASOSABS 0.1 12/30/2015 1024    03/26/2015 [place]: Patchy, mild sinusoidal dilatation, predominantly central lobular with some areas showing mild periportal dilatation. There aer also scattered dilated portal hepatic veins. The portal areas otherwise do not show significantly increased  inflammation. Reticulin stain demonstrates patchy mild increased staining around occasional central veins,. No increased fibrosis is identified with Trichrome stain, and no increased iron is identified with iron stain. The pattern of sinusoidal dilatation is patchy and mild. Sinusoidal dilatation is a non specific finding, and can occasionally be associated with vascular disorders such as portal vein obstruction or portal hypertension  11/27/2014 [place]:  Corresponding Metavir fibrosis score: F2/F3 Risk of fibrosis: Moderate  11/10/2014 Hep B Surface Antibody negative. Hepatitis B DNA Ultra 2336 He B Surface antigen positive Hep B E Antigen negative. Hep B Surface Antibody Negative. Hep C antibody negative   Review of Systems Past Medical History:  Diagnosis Date  . Hematuria 11/12/2014  . Hepatitis B carrier ([place]   . Hypothyroidism   . Impulse disorder   . IUD (intrauterine device) in place 11/12/2014   [facility] . Mild intellectual disability   . Schizophrenia ([place]   . Schizophrenia ([place]   . Tuberculosis    as a child  . Urinary frequency 11/12/2014    Past Surgical History:  Procedure Laterality Date  . ARM WOUND REPAIR / CLOSURE    . TRACHEAL SURGERY    . TRACHEOSTOMY      No Known Allergies  Current Outpatient Prescriptions  on File Prior to Visit  Medication Sig Dispense Refill  . acetaminophen (TYLENOL) 650 MG CR tablet Take 650 mg by mouth every 8 (eight) hours as needed for pain.    Marland Kitchen albuterol (PROVENTIL HFA;VENTOLIN HFA) 108 (90 BASE) MCG/ACT inhaler Inhale 1-2 puffs into the lungs every 6 (six) hours as needed for wheezing or shortness of breath.    . cholecalciferol (VITAMIN D) 1000 UNITS tablet Take 2,000 Units by mouth daily.    . cloZAPine (CLOZARIL) 100 MG tablet Take 250 mg by mouth at bedtime.    Marland Kitchen entecavir (BARACLUDE) 0.5 MG tablet TAKE (1) TABLET BY MOUTH ONCE DAILY. 30 tablet 5  . Fish Oil-Cholecalciferol (FISH OIL + D3) 1000-1000  MG-UNIT CAPS Take 1 capsule by mouth 2 (two) times daily. 60 capsule 2  . FLUoxetine (PROZAC) 20 MG capsule TAKE 3 CAPSULES (60MG) BY MOUTH ONCE DAILY. 90 capsule 1  . fluticasone (FLONASE) 50 MCG/ACT nasal spray SPRAY 2 SPRAYS IN RIGHT NOSTRIL ONLY TWICE DAILY. 16 g 4  . ibuprofen (ADVIL,MOTRIN) 600 MG tablet Take 600 mg by mouth every 6 (six) hours as needed for mild pain.    Marland Kitchen levonorgestrel (MIRENA) 20 MCG/24HR IUD 1 each by Intrauterine route once.    . Skin Protectants, Misc. (EUCERIN) cream Apply 1 application topically as needed for dry skin.     No current facility-administered medications on file prior to visit.        Objective:   Physical Exam Blood pressure 100/62, pulse 80, temperature 97.7 F (36.5 C), height 5' 3"  (1.6 m), weight 142 lb 9.6 oz (64.7 kg).  Alert and oriented. Skin warm and dry. Oral mucosa is moist.   . Sclera anicteric, conjunctivae is pink. Thyroid not enlarged. No cervical lymphadenopathy. Lungs clear. Heart regular rate and rhythm.  Abdomen is soft. Bowel sounds are positive. No hepatomegaly. No abdominal masses felt. No tenderness.  No edema to lower extremities. P        Assessment & Plan:

## 2016-03-23 ENCOUNTER — Ambulatory Visit (INDEPENDENT_AMBULATORY_CARE_PROVIDER_SITE_OTHER): Payer: Medicare Other | Admitting: Family Medicine

## 2016-03-23 ENCOUNTER — Encounter: Payer: Self-pay | Admitting: Family Medicine

## 2016-03-23 VITALS — BP 112/71 | HR 104 | Temp 99.4°F | Ht 63.0 in | Wt 142.0 lb

## 2016-03-23 DIAGNOSIS — J019 Acute sinusitis, unspecified: Secondary | ICD-10-CM | POA: Diagnosis not present

## 2016-03-23 DIAGNOSIS — R451 Restlessness and agitation: Secondary | ICD-10-CM

## 2016-03-23 LAB — CBC WITH DIFFERENTIAL/PLATELET
BASOS ABS: 76 {cells}/uL (ref 0–200)
BASOS PCT: 1 %
EOS ABS: 76 {cells}/uL (ref 15–500)
Eosinophils Relative: 1 %
HEMATOCRIT: 40.5 % (ref 35.0–45.0)
Hemoglobin: 13 g/dL (ref 11.7–15.5)
LYMPHS PCT: 16 %
Lymphs Abs: 1216 cells/uL (ref 850–3900)
MCH: 29.4 pg (ref 27.0–33.0)
MCHC: 32.1 g/dL (ref 32.0–36.0)
MCV: 91.6 fL (ref 80.0–100.0)
MONO ABS: 760 {cells}/uL (ref 200–950)
MPV: 9.6 fL (ref 7.5–12.5)
Monocytes Relative: 10 %
Neutro Abs: 5472 cells/uL (ref 1500–7800)
Neutrophils Relative %: 72 %
Platelets: 186 10*3/uL (ref 140–400)
RBC: 4.42 MIL/uL (ref 3.80–5.10)
RDW: 13 % (ref 11.0–15.0)
WBC: 7.6 10*3/uL (ref 3.8–10.8)

## 2016-03-23 LAB — HEPATIC FUNCTION PANEL
ALK PHOS: 63 U/L (ref 33–115)
ALT: 14 U/L (ref 6–29)
AST: 19 U/L (ref 10–30)
Albumin: 4 g/dL (ref 3.6–5.1)
BILIRUBIN INDIRECT: 0.5 mg/dL (ref 0.2–1.2)
Bilirubin, Direct: 0.1 mg/dL (ref <=0.2)
TOTAL PROTEIN: 6.2 g/dL (ref 6.1–8.1)
Total Bilirubin: 0.6 mg/dL (ref 0.2–1.2)

## 2016-03-23 MED ORDER — HYDROXYZINE HCL 50 MG PO TABS: 50.0000 mg | ORAL_TABLET | Freq: Three times a day (TID) | ORAL | 1 refills | Status: DC | PRN

## 2016-03-23 NOTE — Progress Notes (Signed)
BP 112/71   Pulse (!) 104   Temp 99.4 F (37.4 C) (Oral)   Ht 5' 3"  (1.6 m)   Wt 142 lb (64.4 kg)   BMI 25.15 kg/m    Subjective:    [name], female    DOB: 02-23-1971, 45 y.o.   MRN: [id number]  HPI: Margaret [name] is a 45 y.o. female presenting on 03/23/2016 for Disruptive behavior and knot over eye   HPI Agitation and behavioral changes Patient has been having agitation and behavioral issues more since Thanksgiving and through the holiday season than she usually has. They have an appointment with a psychiatrist but they could not urinate quick enough so that he wanted to come here and see what we can do. She denies any suicidal ideations or thoughts of hurting herself. Her caretaker that is here with her today says that she has been a lot more aggressive and combative is not normally like this and they're wondering if they get something to help calm her down until she can get to the psychiatrist for possible changes in medication. Her caretaker think a lot of this happens because she sees all other people get to go home with her family is over the holiday and she does not.  Sinus congestion and drainage She has been having yellow-green sinus drainage and congestion that's been increased over the past week. She denies any fevers or chills or shortness of breath and wheezing. She does have a prescription for Flonase and is used it once but not consistently since this started. She feels like she has pressure in ears and her sinuses under her eyes. She has not had much of a cough but does have some nasal drainage and postnasal drainage.  Relevant past medical, surgical, family and social history reviewed and updated as indicated. Interim medical history since our last visit reviewed. Allergies and medications reviewed and updated.  Review of Systems  Constitutional: Negative for chills and fever.  HENT: Positive for congestion, postnasal drip, rhinorrhea, sinus  pressure, sneezing and sore throat. Negative for ear discharge and ear pain.   Eyes: Negative for pain, redness and visual disturbance.  Respiratory: Positive for cough. Negative for chest tightness, shortness of breath and wheezing.   Cardiovascular: Negative for chest pain and leg swelling.  Genitourinary: Negative for difficulty urinating and dysuria.  Musculoskeletal: Negative for back pain and gait problem.  Skin: Negative for rash.  Neurological: Negative for light-headedness and headaches.  Psychiatric/Behavioral: Positive for agitation and dysphoric mood. Negative for behavioral problems, hallucinations, self-injury, sleep disturbance and suicidal ideas. The patient is nervous/anxious.   All other systems reviewed and are negative.   Per HPI unless specifically indicated above     Objective:    BP 112/71   Pulse (!) 104   Temp 99.4 F (37.4 C) (Oral)   Ht 5' 3"  (1.6 m)   Wt 142 lb (64.4 kg)   BMI 25.15 kg/m   Wt Readings from Last 3 Encounters:  03/23/16 142 lb (64.4 kg)  03/22/16 142 lb 9.6 oz (64.7 kg)  12/30/15 145 lb 4 oz (65.9 kg)    Physical Exam  Constitutional: She is oriented to person, place, and time. She appears well-developed and well-nourished. No distress.  HENT:  Right Ear: Tympanic membrane, external ear and ear canal normal.  Left Ear: Tympanic membrane, external ear and ear canal normal.  Nose: Mucosal edema and rhinorrhea present. No epistaxis. Right sinus exhibits no maxillary sinus tenderness and no  frontal sinus tenderness. Left sinus exhibits no maxillary sinus tenderness and no frontal sinus tenderness.  Mouth/Throat: Uvula is midline and mucous membranes are normal. Posterior oropharyngeal edema and posterior oropharyngeal erythema present. No oropharyngeal exudate or tonsillar abscesses.  Eyes: Conjunctivae and EOM are normal.  Cardiovascular: Normal rate, regular rhythm, normal heart sounds and intact distal pulses.   No murmur  heard. Pulmonary/Chest: Effort normal and breath sounds normal. No respiratory distress. She has no wheezes. She has no rales.  Musculoskeletal: Normal range of motion. She exhibits no edema or tenderness.  Neurological: She is alert and oriented to person, place, and time. Coordination normal.  Skin: Skin is warm and dry. No rash noted. She is not diaphoretic.  Psychiatric: She has a normal mood and affect. Judgment normal. Her speech is rapid and/or pressured. Her speech is not tangential. She is agitated. She is not combative (Noncombative here to me). She expresses no suicidal ideation. She expresses no suicidal plans.  Vitals reviewed.     Assessment & Plan:   Problem List Items Addressed This Visit    None    Visit Diagnoses    Agitation    -  Primary   They have been having increased agitation and manic episodes, she sees a psychiatrist next week, will do hydroxyzine in the meantime   Relevant Medications   hydrOXYzine (ATARAX/VISTARIL) 50 MG tablet   Acute rhinosinusitis       Continue Flonase and Benadryl and can use hydroxyzine for this as well, return if worsens or develops any fevers   Relevant Medications   hydrOXYzine (ATARAX/VISTARIL) 50 MG tablet       Follow up plan: Return if symptoms worsen or fail to improve.  Counseling provided for all of the vaccine components No orders of the defined types were placed in this encounter.   Caryl Pina, MD [facility] 03/23/2016, 4:46 PM

## 2016-03-29 LAB — HEPATITIS B DNA, ULTRAQUANTITATIVE, PCR
Hepatitis B DNA (Calc): <1.3 Log IU/mL (ref <1.30)
Hepatitis B DNA: <20 IU/mL (ref <20)

## 2016-03-30 ENCOUNTER — Telehealth (INDEPENDENT_AMBULATORY_CARE_PROVIDER_SITE_OTHER): Payer: Self-pay | Admitting: Internal Medicine

## 2016-03-30 NOTE — Telephone Encounter (Signed)
error 

## 2016-03-31 ENCOUNTER — Telehealth (INDEPENDENT_AMBULATORY_CARE_PROVIDER_SITE_OTHER): Payer: Self-pay | Admitting: Internal Medicine

## 2016-04-14 NOTE — Telephone Encounter (Signed)
err

## 2016-05-04 DIAGNOSIS — Z5181 Encounter for therapeutic drug level monitoring: Secondary | ICD-10-CM | POA: Diagnosis not present

## 2016-05-04 DIAGNOSIS — Z79899 Other long term (current) drug therapy: Secondary | ICD-10-CM | POA: Diagnosis not present

## 2016-05-09 ENCOUNTER — Encounter (HOSPITAL_COMMUNITY): Payer: Self-pay | Admitting: Emergency Medicine

## 2016-05-09 ENCOUNTER — Emergency Department (HOSPITAL_COMMUNITY)
Admission: EM | Admit: 2016-05-09 | Discharge: 2016-05-10 | Disposition: A | Discharge status: Home / Self Care | Payer: Medicare Other | Attending: Emergency Medicine | Admitting: Emergency Medicine

## 2016-05-09 DIAGNOSIS — Z87891 Personal history of nicotine dependence: Secondary | ICD-10-CM | POA: Insufficient documentation

## 2016-05-09 DIAGNOSIS — R45850 Homicidal ideations: Secondary | ICD-10-CM | POA: Diagnosis present

## 2016-05-09 DIAGNOSIS — Z79899 Other long term (current) drug therapy: Secondary | ICD-10-CM | POA: Insufficient documentation

## 2016-05-09 DIAGNOSIS — E039 Hypothyroidism, unspecified: Secondary | ICD-10-CM | POA: Diagnosis not present

## 2016-05-09 DIAGNOSIS — E785 Hyperlipidemia, unspecified: Secondary | ICD-10-CM | POA: Diagnosis not present

## 2016-05-09 DIAGNOSIS — F29 Unspecified psychosis not due to a substance or known physiological condition: Secondary | ICD-10-CM | POA: Diagnosis not present

## 2016-05-09 DIAGNOSIS — R45851 Suicidal ideations: Secondary | ICD-10-CM | POA: Diagnosis not present

## 2016-05-09 DIAGNOSIS — B199 Unspecified viral hepatitis without hepatic coma: Secondary | ICD-10-CM | POA: Diagnosis not present

## 2016-05-09 DIAGNOSIS — F332 Major depressive disorder, recurrent severe without psychotic features: Secondary | ICD-10-CM | POA: Diagnosis present

## 2016-05-09 DIAGNOSIS — F209 Schizophrenia, unspecified: Secondary | ICD-10-CM | POA: Diagnosis not present

## 2016-05-09 DIAGNOSIS — N9489 Other specified conditions associated with female genital organs and menstrual cycle: Secondary | ICD-10-CM | POA: Insufficient documentation

## 2016-05-09 DIAGNOSIS — Z9889 Other specified postprocedural states: Secondary | ICD-10-CM | POA: Diagnosis not present

## 2016-05-09 LAB — COMPREHENSIVE METABOLIC PANEL
ALK PHOS: 62 U/L (ref 38–126)
ALT: 12 U/L — AB (ref 14–54)
AST: 18 U/L (ref 15–41)
Albumin: 3.9 g/dL (ref 3.5–5.0)
Anion gap: 7 (ref 5–15)
BUN: 8 mg/dL (ref 6–20)
CALCIUM: 9 mg/dL (ref 8.9–10.3)
CO2: 28 mmol/L (ref 22–32)
CREATININE: 0.58 mg/dL (ref 0.44–1.00)
Chloride: 105 mmol/L (ref 101–111)
GFR calc non Af Amer: >60 mL/min (ref >60)
Glucose, Bld: 89 mg/dL (ref 65–99)
Potassium: 3.6 mmol/L (ref 3.5–5.1)
SODIUM: 140 mmol/L (ref 135–145)
Total Bilirubin: 0.7 mg/dL (ref 0.3–1.2)
Total Protein: 6.3 g/dL — ABNORMAL LOW (ref 6.5–8.1)

## 2016-05-09 LAB — CBC
HEMATOCRIT: 37.5 % (ref 36.0–46.0)
HEMOGLOBIN: 12.6 g/dL (ref 12.0–15.0)
MCH: 30.4 pg (ref 26.0–34.0)
MCHC: 33.6 g/dL (ref 30.0–36.0)
MCV: 90.6 fL (ref 78.0–100.0)
Platelets: 213 10*3/uL (ref 150–400)
RBC: 4.14 MIL/uL (ref 3.87–5.11)
RDW: 13.5 % (ref 11.5–15.5)
WBC: 7.6 10*3/uL (ref 4.0–10.5)

## 2016-05-09 LAB — RAPID URINE DRUG SCREEN, HOSP PERFORMED
Amphetamines: NOT DETECTED
Barbiturates: NOT DETECTED
Benzodiazepines: NOT DETECTED
Cocaine: NOT DETECTED
OPIATES: NOT DETECTED
TETRAHYDROCANNABINOL: NOT DETECTED

## 2016-05-09 LAB — ETHANOL: Alcohol, Ethyl (B): <5 mg/dL (ref <5)

## 2016-05-09 LAB — ACETAMINOPHEN LEVEL: Acetaminophen (Tylenol), Serum: <10 ug/mL — ABNORMAL LOW (ref 10–30)

## 2016-05-09 LAB — SALICYLATE LEVEL

## 2016-05-09 LAB — HCG, QUANTITATIVE, PREGNANCY

## 2016-05-09 MED ORDER — NICOTINE 21 MG/24HR TD PT24
21.0000 mg | MEDICATED_PATCH | Freq: Every day | TRANSDERMAL | Status: DC
  Administered 2016-05-09: 21 mg via TRANSDERMAL
  Filled 2016-05-09: qty 1

## 2016-05-09 MED ORDER — ALUM & MAG HYDROXIDE-SIMETH 200-200-20 MG/5ML PO SUSP: 30.0000 mL | ORAL | Status: DC | PRN

## 2016-05-09 MED ORDER — ONDANSETRON HCL 4 MG PO TABS
4.0000 mg | ORAL_TABLET | Freq: Three times a day (TID) | ORAL | Status: DC | PRN
  Administered 2016-05-09: 4 mg via ORAL
  Filled 2016-05-09: qty 1

## 2016-05-09 MED ORDER — LORAZEPAM 1 MG PO TABS
1.0000 mg | ORAL_TABLET | Freq: Three times a day (TID) | ORAL | Status: DC | PRN
  Administered 2016-05-09: 1 mg via ORAL
  Filled 2016-05-09: qty 1

## 2016-05-09 MED ORDER — ZOLPIDEM TARTRATE 5 MG PO TABS
5.0000 mg | ORAL_TABLET | Freq: Every evening | ORAL | Status: DC | PRN
  Administered 2016-05-09: 5 mg via ORAL
  Filled 2016-05-09: qty 1

## 2016-05-09 MED ORDER — IBUPROFEN 400 MG PO TABS
600.0000 mg | ORAL_TABLET | Freq: Three times a day (TID) | ORAL | Status: DC | PRN
  Administered 2016-05-10: 600 mg via ORAL
  Filled 2016-05-09: qty 2

## 2016-05-09 MED ORDER — ACETAMINOPHEN 325 MG PO TABS
650.0000 mg | ORAL_TABLET | ORAL | Status: DC | PRN
  Administered 2016-05-09: 650 mg via ORAL
  Filled 2016-05-09: qty 2

## 2016-05-09 NOTE — ED Notes (Signed)
TTS in progress 
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
## 2016-05-09 NOTE — ED Triage Notes (Signed)
Pt group home [facility] called EMS for SI. Pt c/o having a "green frog in her ear that she needs to be taken out".

## 2016-05-10 DIAGNOSIS — E785 Hyperlipidemia, unspecified: Secondary | ICD-10-CM

## 2016-05-10 DIAGNOSIS — Z9889 Other specified postprocedural states: Secondary | ICD-10-CM

## 2016-05-10 DIAGNOSIS — F332 Major depressive disorder, recurrent severe without psychotic features: Secondary | ICD-10-CM | POA: Diagnosis present

## 2016-05-10 DIAGNOSIS — B199 Unspecified viral hepatitis without hepatic coma: Secondary | ICD-10-CM | POA: Diagnosis not present

## 2016-05-10 DIAGNOSIS — Z87891 Personal history of nicotine dependence: Secondary | ICD-10-CM

## 2016-05-10 DIAGNOSIS — F209 Schizophrenia, unspecified: Secondary | ICD-10-CM | POA: Diagnosis present

## 2016-05-10 DIAGNOSIS — Z79899 Other long term (current) drug therapy: Secondary | ICD-10-CM

## 2016-05-10 LAB — URINALYSIS, ROUTINE W REFLEX MICROSCOPIC
Bilirubin Urine: NEGATIVE
Glucose, UA: NEGATIVE mg/dL
Hgb urine dipstick: NEGATIVE
KETONES UR: NEGATIVE mg/dL
LEUKOCYTES UA: NEGATIVE
NITRITE: NEGATIVE
PH: 7 (ref 5.0–8.0)
Protein, ur: NEGATIVE mg/dL
Specific Gravity, Urine: 1.006 (ref 1.005–1.030)

## 2016-05-10 NOTE — ED Notes (Signed)
Patient notified a urine sample is needed at this time. Patient states she can get one after her evaluation from [facility].

## 2016-05-10 NOTE — ED Notes (Signed)
Tele psych placed in room for Margaret [name] to speak to patient from [facility].

## 2016-05-10 NOTE — ED Notes (Signed)
Spoke with Myra Caple from pt's group home, will be sending someone to pick pt up.

## 2016-05-10 NOTE — ED Notes (Signed)
TTS in progress 

## 2016-05-10 NOTE — Consult Note (Signed)
Telepsych Consultation   Reason for Consult:  Chronic hallucinations about frogs and octopuses  Referring Physician:  EDP Patient Identification: Margaret [name] MRN:  [id number] Principal Diagnosis: Schizophrenia [facility]) Diagnosis:   Patient Active Problem List   Diagnosis Date Noted  . Schizophrenia ([place]) [F20.9]     Priority: High  . Hepatitis, viral [B19.9]   . Hyperlipidemia [E78.5] 12/18/2014  . Postinflammatory pulmonary fibrosis ([place]) [J84.10] 11/19/2014  . History of tuberculosis [Z86.11] 11/17/2014  . Orthostatic hypotension [I95.1] 11/17/2014  . Right nasal polyps [J33.9] 11/17/2014  . Urinary frequency [R35.0] 11/12/2014  . Hematuria [R31.9] 11/12/2014  . IUD (intrauterine device) in place [Z97.5] 11/12/2014  . Hepatitis B [B19.10] 11/10/2014    Total Time spent with patient: 30 minutes  Subjective:   Margaret [name] is a 46 y.o. female patient admitted with reports of seeing a frog inside her ear and seeing octopuses. Pt seen and chart reviewed. Pt is alert/oriented to self, but not to time, place, situation. Pt is calm, cooperative, and appropriate to situation. Pt denies (via shaking head "no")  suicidal/homicidal ideation and psychosis and does not appear to be responding to internal stimuli. Pt is severely limited intellectually and was gesturing at the television and smiling. She would not directly respond to my questions. ED staff report that she has been pleasant and cooperative.   HPI:  I have reviewed and concur with HPI elements below, modified as follows: "Margaret [name] is an 46 y.o. female who was brought to the [facility] tonight by EMS called by her group home staff. Per pt record, EMS was called due to pt being aggressive at her [facility] and threatening to hurt someone there. Pt was a poor historian and attempts to contact her [place] were unsuccessful. Numbers called to [facility] (Myra Caple at [phone]), number on Faces[employer] ([place] [phone]) and number on website  [phone] [facility].) Per pt record (H&P by Allred, PA on 03/24/15) pt has mild ID. No IQ score was given. Pt sts she is suicidal but sts she "wouldn't relly do anything" and sts she has no plan. Pt is experiencing visual, auditory and tactile hallucinations. Pt sts she feels a green frog who lives in her ear and her head and has been there since she was 89 yo "a baby."  Pt sts there are 3 octypus' who live in her group home. She described the octypus' and made the comparison of the octypus/person from the little mermaid Disney movie to describe. Pt denied wanting to harm anyone but, stated that there was a girl in her [place] who did not like her and who she sometimes "fights and cusses at." Pt denied fighting her tonight. Per pt record (03/25/15 visit) pt has been increasingly agitated since Thanksgiving and the reason given was that other resident at the group home went home to visit family but pt does not.    Per pt record/notes, pt lives at [facility]. Pt sts she has a guardian "named Hoyle Sauer who lives in [place]." Pt could not give any more information. Per paperwork given to the ED pt sees a psychiatrist, Dr. Tamera Punt, at [facility] in [place] did not give IQ or guardian's name or contact information. Pt sts that she is stressed because she does not like her [place]. Per pt record, pt has prior diagnoses of Schizophrenia, Bipolar D/O, Anxiety and Depression.   Pt was dressed in scrubs. Pt was alert, cooperative and pleasant. Pt continually asked questions interrupting asking if she was going home tonight  or if she was staying. Pt stated she was worried about "her stuff." Pt kept good eye contact, spoke in a clear tone and at a rapid, pressured pace. Pt moved in a normal manner when moving but in quickly movements. Pt's thought process was coherent and relevant and at times lapsed into tangential thinking. Pt's judgement was impaired.  No clear indication of delusional thinking or  response to internal stimuli. Pt's mood was stated as not depressed but her questions and comments suggested she was anxious and blunted affect was congruent.  Pt was oriented x 3, to person, place and time. Pt stated she did not know why she was brought to the hospital. "  Pt spent the night in the ED without incident. Pt seen today on 05/10/2016 and has been cooperative with ED staff.   Past Psychiatric History: schizophrenia  Risk to Self: Suicidal Ideation: No Suicidal Intent: No Is patient at risk for suicide?: No (based on lack of intent or plan) Suicidal Plan?: No Access to Means: No What has been your use of drugs/alcohol within the last 12 months?:  (none) Other Self Harm Risks:  (uta) Intentional Self Injurious Behavior:  (uta) Risk to Others: Homicidal Ideation: No (denies strongly) Thoughts of Harm to Others: No Current Homicidal Intent: No Current Homicidal Plan: No Access to Homicidal Means: No History of harm to others?:  (uta) Assessment of Violence: On admission (per note, [place] staff sts that pt has been aggressive recently) Violent Behavior Description:  (no description given) Does patient have access to weapons?: No Criminal Charges Pending?:  (uta) Does patient have a court date:  [place]) Prior Inpatient Therapy: Prior Inpatient Therapy: Yes Prior Therapy Dates:  (multiple referred to in pt record) Prior Therapy Facilty/Provider(s):  (unknown) Reason for Treatment:  (Schizophrenia, Bipolar) Prior Outpatient Therapy: Prior Outpatient Therapy:  (unknown) Does patient have an ACCT team?: No Does patient have Intensive In-House Services?  : No Does patient have [facility] services? :  (unknown) Does patient have P4CC services?: Unknown  Past Medical History:  Past Medical History:  Diagnosis Date  . Hematuria 11/12/2014  . Hepatitis B carrier ([place])   . Hypothyroidism   . Impulse disorder   . IUD (intrauterine device) in place 11/12/2014   Inserted 12/24/12 at [facility]  .  Mild intellectual disability   . Schizophrenia ([place])   . Schizophrenia ([place])   . Tuberculosis    as a child  . Urinary frequency 11/12/2014    Past Surgical History:  Procedure Laterality Date  . ARM WOUND REPAIR / CLOSURE    . TRACHEAL SURGERY    . TRACHEOSTOMY     Family History:  Family History  Problem Relation Age of Onset  . Adopted: Yes   Family Psychiatric  History: denies Social History:  History  Alcohol Use No     History  Drug Use No    Social History   Social History  . Marital status: Single    Spouse name: N/A  . Number of children: N/A  . Years of education: N/A   Social History Main Topics  . Smoking status: Former Smoker    Packs/day: 1.00    Years: 24.00    Types: Cigarettes    Quit date: 11/03/2010  . Smokeless tobacco: Never Used  . Alcohol use No  . Drug use: No  . Sexual activity: No   Other Topics Concern  . None   Social History Narrative  . None   Additional Social History:  Allergies:  No Known Allergies  Labs:  Results for orders placed or performed during the hospital encounter of 05/09/16 (from the past 48 hour(s))  Rapid urine drug screen (hospital performed)     Status: None   Collection Time: 05/09/16  3:49 PM  Result Value Ref Range   Opiates NONE DETECTED NONE DETECTED   Cocaine NONE DETECTED NONE DETECTED   Benzodiazepines NONE DETECTED NONE DETECTED   Amphetamines NONE DETECTED NONE DETECTED   Tetrahydrocannabinol NONE DETECTED NONE DETECTED   Barbiturates NONE DETECTED NONE DETECTED    Comment:        DRUG SCREEN FOR MEDICAL PURPOSES ONLY.  IF CONFIRMATION IS NEEDED FOR ANY PURPOSE, NOTIFY LAB WITHIN 5 DAYS.        LOWEST DETECTABLE LIMITS FOR URINE DRUG SCREEN Drug Class       Cutoff (ng/mL) Amphetamine      1000 Barbiturate      200 Benzodiazepine   704 Tricyclics       888 Opiates          300 Cocaine          300 THC              50   Comprehensive metabolic panel     Status: Abnormal    Collection Time: 05/09/16  3:57 PM  Result Value Ref Range   Sodium 140 135 - 145 mmol/L   Potassium 3.6 3.5 - 5.1 mmol/L   Chloride 105 101 - 111 mmol/L   CO2 28 22 - 32 mmol/L   Glucose, Bld 89 65 - 99 mg/dL   BUN 8 6 - 20 mg/dL   Creatinine, Ser 0.58 0.44 - 1.00 mg/dL   Calcium 9.0 8.9 - 10.3 mg/dL   Total Protein 6.3 (L) 6.5 - 8.1 g/dL   Albumin 3.9 3.5 - 5.0 g/dL   AST 18 15 - 41 U/L   ALT 12 (L) 14 - 54 U/L   Alkaline Phosphatase 62 38 - 126 U/L   Total Bilirubin 0.7 0.3 - 1.2 mg/dL   GFR calc non Af Amer >60 >60 mL/min   GFR calc Af Amer >60 >60 mL/min    Comment: (NOTE) The eGFR has been calculated using the CKD EPI equation. This calculation has not been validated in all clinical situations. eGFR's persistently <60 mL/min signify possible Chronic Kidney Disease.    Anion gap 7 5 - 15  Ethanol     Status: None   Collection Time: 05/09/16  3:57 PM  Result Value Ref Range   Alcohol, Ethyl (B) <5 <5 mg/dL    Comment:        LOWEST DETECTABLE LIMIT FOR SERUM ALCOHOL IS 5 mg/dL FOR MEDICAL PURPOSES ONLY   Salicylate level     Status: None   Collection Time: 05/09/16  3:57 PM  Result Value Ref Range   Salicylate Lvl <9.1 2.8 - 30.0 mg/dL  Acetaminophen level     Status: Abnormal   Collection Time: 05/09/16  3:57 PM  Result Value Ref Range   Acetaminophen (Tylenol), Serum <10 (L) 10 - 30 ug/mL    Comment:        THERAPEUTIC CONCENTRATIONS VARY SIGNIFICANTLY. A RANGE OF 10-30 ug/mL MAY BE AN EFFECTIVE CONCENTRATION FOR MANY PATIENTS. HOWEVER, SOME ARE BEST TREATED AT CONCENTRATIONS OUTSIDE THIS RANGE. ACETAMINOPHEN CONCENTRATIONS >150 ug/mL AT 4 HOURS AFTER INGESTION AND >50 ug/mL AT 12 HOURS AFTER INGESTION ARE OFTEN ASSOCIATED WITH TOXIC REACTIONS.   cbc  Status: None   Collection Time: 05/09/16  3:57 PM  Result Value Ref Range   WBC 7.6 4.0 - 10.5 K/uL   RBC 4.14 3.87 - 5.11 MIL/uL   Hemoglobin 12.6 12.0 - 15.0 g/dL   HCT 37.5 36.0 - 46.0 %   MCV  90.6 78.0 - 100.0 fL   MCH 30.4 26.0 - 34.0 pg   MCHC 33.6 30.0 - 36.0 g/dL   RDW 13.5 11.5 - 15.5 %   Platelets 213 150 - 400 K/uL  hCG, quantitative, pregnancy     Status: None   Collection Time: 05/09/16  4:05 PM  Result Value Ref Range   hCG, Beta Chain, Quant, S <1 <5 mIU/mL    Comment:          GEST. AGE      CONC.  (mIU/mL)   <=1 WEEK        5 - 50     2 WEEKS       50 - 500     3 WEEKS       100 - 10,000     4 WEEKS     1,000 - 30,000     5 WEEKS     3,500 - 115,000   6-8 WEEKS     12,000 - 270,000    12 WEEKS     15,000 - 220,000        FEMALE AND NON-PREGNANT FEMALE:     LESS THAN 5 mIU/mL   Urinalysis, Routine w reflex microscopic     Status: None   Collection Time: 05/10/16 11:10 AM  Result Value Ref Range   Color, Urine YELLOW YELLOW   APPearance CLEAR CLEAR   Specific Gravity, Urine 1.006 1.005 - 1.030   pH 7.0 5.0 - 8.0   Glucose, UA NEGATIVE NEGATIVE mg/dL   Hgb urine dipstick NEGATIVE NEGATIVE   Bilirubin Urine NEGATIVE NEGATIVE   Ketones, ur NEGATIVE NEGATIVE mg/dL   Protein, ur NEGATIVE NEGATIVE mg/dL   Nitrite NEGATIVE NEGATIVE   Leukocytes, UA NEGATIVE NEGATIVE    Current Facility-Administered Medications  Medication Dose Route Frequency Provider Last Rate Last Dose  . acetaminophen (TYLENOL) tablet 650 mg  650 mg Oral Q4H PRN Nat Christen, MD   650 mg at 05/09/16 2229  . alum & mag hydroxide-simeth (MAALOX/MYLANTA) 200-200-20 MG/5ML suspension 30 mL  30 mL Oral PRN Nat Christen, MD      . ibuprofen (ADVIL,MOTRIN) tablet 600 mg  600 mg Oral Q8H PRN Nat Christen, MD   600 mg at 05/10/16 0316  . LORazepam (ATIVAN) tablet 1 mg  1 mg Oral Q8H PRN Nat Christen, MD   1 mg at 05/09/16 2229  . nicotine (NICODERM CQ - dosed in mg/24 hours) patch 21 mg  21 mg Transdermal Daily Nat Christen, MD   21 mg at 05/09/16 2228  . ondansetron (ZOFRAN) tablet 4 mg  4 mg Oral Q8H PRN Nat Christen, MD   4 mg at 05/09/16 2229  . zolpidem (AMBIEN) tablet 5 mg  5 mg Oral QHS PRN Nat Christen, MD   5 mg at 05/09/16 2229   Current Outpatient Prescriptions  Medication Sig Dispense Refill  . cholecalciferol (VITAMIN D) 1000 UNITS tablet Take 2,000 Units by mouth daily.    . cloZAPine (CLOZARIL) 100 MG tablet Take 250 mg by mouth at bedtime.    Marland Kitchen entecavir (BARACLUDE) 0.5 MG tablet TAKE (1) TABLET BY MOUTH ONCE DAILY. 30 tablet 5  . Fish Oil-Cholecalciferol (  FISH OIL + D3) 1000-1000 MG-UNIT CAPS Take 1 capsule by mouth 2 (two) times daily. 60 capsule 2  . FLUoxetine (PROZAC) 20 MG capsule TAKE 3 CAPSULES (60MG) BY MOUTH ONCE DAILY. 90 capsule 1  . fluticasone (FLONASE) 50 MCG/ACT nasal spray SPRAY 2 SPRAYS IN RIGHT NOSTRIL ONLY TWICE DAILY. 16 g 4  . Multiple Vitamin (MULTIVITAMIN WITH MINERALS) TABS tablet Take 1 tablet by mouth daily.    . Skin Protectants, Misc. (EUCERIN) cream Apply 1 application topically as needed for dry skin.    Marland Kitchen albuterol (PROVENTIL HFA;VENTOLIN HFA) 108 (90 BASE) MCG/ACT inhaler Inhale 1-2 puffs into the lungs every 6 (six) hours as needed for wheezing or shortness of breath.    . hydrOXYzine (ATARAX/VISTARIL) 50 MG tablet Take 1 tablet (50 mg total) by mouth 3 (three) times daily as needed. (Patient not taking: Reported on 05/09/2016) 45 tablet 1  . levonorgestrel (MIRENA) 20 MCG/24HR IUD 1 each by Intrauterine route once.      Musculoskeletal: UTO, camera  Psychiatric Specialty Exam: Physical Exam  Review of Systems  Psychiatric/Behavioral: Positive for hallucinations (chronic, not interfering with ADL's). Negative for depression, substance abuse and suicidal ideas. The patient is not nervous/anxious and does not have insomnia.   All other systems reviewed and are negative.   Blood pressure 106/55, pulse 78, temperature 98.7 F (37.1 C), temperature source Oral, resp. rate 18, height 5' 7"  (1.702 m), weight 64.4 kg (142 lb), SpO2 97 %.Body mass index is 22.24 kg/m.  General Appearance: Casual and Fairly Groomed  Eye Contact:  Good  Speech:   Clear and Coherent and Normal Rate  Volume:  Normal  Mood:  Euthymic  Affect:  Appropriate and Congruent  Thought Process:  Coherent, Goal Directed, Linear and Descriptions of Associations: Intact  Orientation:  Full (Time, Place, and Person)  Thought Content:  Focused on television  Suicidal Thoughts:  no, denies  Homicidal Thoughts:  no, denies  Memory:  Immediate;   fair/poor Recent;   fair/poor Remote;   fair/poor  Judgement:  Impaired  Insight:  Lacking  Psychomotor Activity:  Normal  Concentration:  Concentration: Poor and Attention Span: Poor  Recall:  [employer] of Knowledge:  Fair  Language:  Fair  Akathisia:  No  Handed:    AIMS (if indicated):     Assets:  Desire for Improvement Resilience Social Support Talents/Skills  ADL's:  Intact  Cognition:  Impaired,  Moderate  Sleep:      Treatment Plan Summary: Schizophrenia ([place]) stable for outpatient management, chronic hallucinations/delusions are not interfering with ADL's  Disposition: No evidence of imminent risk to self or others at present.   Patient does not meet criteria for psychiatric inpatient admission. Supportive therapy provided about ongoing stressors. Discussed crisis plan, support from social network, calling 911, coming to the Emergency Department, and calling Suicide Hotline.  Benjamine Mola, [place] 05/10/2016 1:41 PM

## 2016-05-10 NOTE — ED Notes (Signed)
Patient given discharge instruction, verbalized understand. Patient ambulatory out of the department.  

## 2016-05-10 NOTE — Progress Notes (Signed)
Reviewed pt's chart. Pt reportedly resident of [place] [phone] and presented to ED last night 2/5- was assessed by TTS who recommended pt remain in ED to receive re-evaluation via telepsych this morning for disposition recommendation.  CSW contacted pt's legal guardian [phone] [employer], Fidela [place]). Guardian states she was unaware pt was in ED but will contact group home this morning to find out what lead to ED encounter. CSW reviewed information in pt assessment available as to reasons pt presented. Guardian states, "Last time I talked with her was about a month ago, I was not aware she was having these issues." Again, states she will discuss with group home.   CSW informed guardian that recommendation was that pt receive re-eval to determine if she is imminent danger of harming self or others or experiencing acute psychosis. Guardian agreeable. Guardian confirms pt has I/DD diagnosis but does not know or have access to IQ score.   Sharren Bridge, MSW, LCSW Clinical Social Work, Disposition  05/10/2016 [phone]

## 2016-05-10 NOTE — ED Notes (Signed)
Meal tray provided.

## 2016-05-12 DIAGNOSIS — F250 Schizoaffective disorder, bipolar type: Secondary | ICD-10-CM | POA: Diagnosis not present

## 2016-06-01 DIAGNOSIS — F250 Schizoaffective disorder, bipolar type: Secondary | ICD-10-CM | POA: Diagnosis not present

## 2016-06-03 ENCOUNTER — Ambulatory Visit (INDEPENDENT_AMBULATORY_CARE_PROVIDER_SITE_OTHER): Payer: Medicare Other | Admitting: Family Medicine

## 2016-06-03 ENCOUNTER — Encounter: Payer: Self-pay | Admitting: Family Medicine

## 2016-06-03 VITALS — BP 111/60 | HR 111 | Temp 97.4°F | Ht 67.0 in | Wt 139.4 lb

## 2016-06-03 DIAGNOSIS — R350 Frequency of micturition: Secondary | ICD-10-CM

## 2016-06-03 LAB — URINALYSIS, COMPLETE
BILIRUBIN UA: NEGATIVE
GLUCOSE, UA: NEGATIVE
Nitrite, UA: NEGATIVE
PROTEIN UA: NEGATIVE
SPEC GRAV UA: 1.01 (ref 1.005–1.030)
UUROB: 2 mg/dL — AB (ref 0.2–1.0)
pH, UA: 6 (ref 5.0–7.5)

## 2016-06-03 LAB — MICROSCOPIC EXAMINATION
Epithelial Cells (non renal): >10 /hpf — AB (ref 0–10)
Renal Epithel, UA: NONE SEEN /hpf

## 2016-06-03 MED ORDER — SULFAMETHOXAZOLE-TRIMETHOPRIM 800-160 MG PO TABS: 1.0000 | ORAL_TABLET | Freq: Two times a day (BID) | ORAL | 0 refills | Status: DC

## 2016-06-03 NOTE — Progress Notes (Signed)
BP 111/60   Pulse (!) 111   Temp 97.4 F (36.3 C) (Oral)   Ht 5' 7"  (1.702 m)   Wt 139 lb 6 oz (63.2 kg)   BMI 21.83 kg/m    Subjective:    [name], female    DOB: June 11, 1970, 46 y.o.   MRN: [id number]  HPI: Margaret [name] is a 46 y.o. female presenting on 06/03/2016 for Urinary Tract Infection (urinary frequency)   HPI Urinary frequency Patient is coming in today because she has been having urinary incontinence and frequency that is been going on for the past few weeks. She has been having increased psychiatric issues and has seen her psychiatrist Dr. Agapito Games for these and he has changed some of her medications up recently. Her caretaker who is here with her today says that they are concerned about the fact that this may be associated to her psychiatric issues versus have any urinary tract infection. She did get treated for urinary tract infection not too long ago and it did not seem to help her make a difference. She left a urine for [place] today. Her urine shows possible infection but nothing conclusive. We will likely go ahead and treat. She denies any fevers chills or flank pain. She denies any abdominal pain.  Relevant past medical, surgical, family and social history reviewed and updated as indicated. Interim medical history since our last visit reviewed. Allergies and medications reviewed and updated.  Review of Systems  Constitutional: Negative for chills and fever.  HENT: Negative for congestion, ear discharge and ear pain.   Eyes: Negative for redness and visual disturbance.  Respiratory: Negative for chest tightness and shortness of breath.   Cardiovascular: Negative for chest pain and leg swelling.  Gastrointestinal: Negative for abdominal pain.  Genitourinary: Positive for frequency and urgency. Negative for difficulty urinating, dysuria and flank pain.  Musculoskeletal: Negative for back pain and gait problem.  Skin: Negative for rash.    Neurological: Negative for light-headedness and headaches.  Psychiatric/Behavioral: Negative for agitation and behavioral problems.  All other systems reviewed and are negative.   Per HPI unless specifically indicated above     Objective:    BP 111/60   Pulse (!) 111   Temp 97.4 F (36.3 C) (Oral)   Ht 5' 7"  (1.702 m)   Wt 139 lb 6 oz (63.2 kg)   BMI 21.83 kg/m   Wt Readings from Last 3 Encounters:  06/03/16 139 lb 6 oz (63.2 kg)  05/09/16 142 lb (64.4 kg)  03/23/16 142 lb (64.4 kg)    Physical Exam  Constitutional: She is oriented to person, place, and time. She appears well-developed and well-nourished. No distress.  Eyes: Conjunctivae are normal.  Cardiovascular: Normal rate, regular rhythm, normal heart sounds and intact distal pulses.   No murmur heard. Pulmonary/Chest: Effort normal and breath sounds normal. No respiratory distress. She has no wheezes. She has no rales.  Abdominal: Soft. Bowel sounds are normal. She exhibits no distension. There is no tenderness. There is no rebound and no guarding.  Musculoskeletal: Normal range of motion. She exhibits no edema or tenderness.  Neurological: She is alert and oriented to person, place, and time. Coordination normal.  Skin: Skin is warm and dry. No rash noted. She is not diaphoretic.  Psychiatric: She has a normal mood and affect. Her behavior is normal.  Nursing note and vitals reviewed.   Urinalysis: 1+ leukocytes trace ketones 1+ blood, 6-10 WBCs, 0-2 rbc's, greater than  10 epithelial cells, few bacteria.    Assessment & Plan:   Problem List Items Addressed This Visit      Other   Urinary frequency - Primary   Relevant Medications   sulfamethoxazole-trimethoprim (BACTRIM DS) 800-160 MG tablet   Other Relevant Orders   Urinalysis, Complete   Urine culture       Follow up plan: Return if symptoms worsen or fail to improve.  Counseling provided for all of the vaccine components Orders Placed This  Encounter  Procedures  . Urinalysis, Complete    Caryl Pina, MD [facility] 06/03/2016, 11:54 AM

## 2016-06-05 LAB — URINE CULTURE

## 2016-06-08 ENCOUNTER — Other Ambulatory Visit: Payer: Self-pay | Admitting: Family Medicine

## 2016-06-08 DIAGNOSIS — R350 Frequency of micturition: Secondary | ICD-10-CM

## 2016-06-08 MED ORDER — SULFAMETHOXAZOLE-TRIMETHOPRIM 800-160 MG PO TABS: 1.0000 | ORAL_TABLET | Freq: Two times a day (BID) | ORAL | 0 refills | Status: DC

## 2016-06-29 ENCOUNTER — Ambulatory Visit (INDEPENDENT_AMBULATORY_CARE_PROVIDER_SITE_OTHER): Payer: Medicare Other | Admitting: Family Medicine

## 2016-06-29 ENCOUNTER — Encounter: Payer: Self-pay | Admitting: Family Medicine

## 2016-06-29 VITALS — BP 106/70 | HR 95 | Temp 99.1°F | Ht 67.0 in | Wt 137.0 lb

## 2016-06-29 DIAGNOSIS — R350 Frequency of micturition: Secondary | ICD-10-CM | POA: Diagnosis not present

## 2016-06-29 DIAGNOSIS — E782 Mixed hyperlipidemia: Secondary | ICD-10-CM | POA: Diagnosis not present

## 2016-06-29 DIAGNOSIS — R5383 Other fatigue: Secondary | ICD-10-CM

## 2016-06-29 DIAGNOSIS — Z131 Encounter for screening for diabetes mellitus: Secondary | ICD-10-CM

## 2016-06-29 NOTE — Progress Notes (Signed)
BP 106/70   Pulse 95   Temp 99.1 F (37.3 C) (Oral)   Ht 5' 7"  (1.702 m)   Wt 137 lb (62.1 kg)   BMI 21.46 kg/m    Subjective:    [name], female    DOB: 16-Mar-1971, 46 y.o.   MRN: [id number]  HPI: Margaret [name] is a 46 y.o. female presenting on 06/29/2016 for Hyperlipidemia (6 month followup; patient is not fasting)   HPI Cholesterol recheck Patient is coming in for cholesterol recheck today. She is currently on fish oils and has been doing well on it. She does not have any major risk factors for cardiac disease and we have been monitoring every 6 months these labs. She denies any chest pain or palpitations or lightheadedness or dizziness or focal numbness or weakness. She does feel down and energy sometimes, we will check thyroid.  Urinary frequency Patient is coming in with urinary frequency that has been going on for the past day and a half again. She was treated for this not too long ago and had a UTI. The also concerned that it might be related to her anxiety and stress as she was coming here today. She denies any burning when she views her blood in her urine. She denies any abdominal pain or back pain or flank pain. She denies any fevers or chills. she did have one episode of bleeding vaginally. She has not had any spotting or vaginal irritation.  Relevant past medical, surgical, family and social history reviewed and updated as indicated. Interim medical history since our last visit reviewed. Allergies and medications reviewed and updated.  Review of Systems  Constitutional: Negative for chills and fever.  HENT: Negative for congestion, ear discharge and ear pain.   Eyes: Negative for redness and visual disturbance.  Respiratory: Negative for chest tightness and shortness of breath.   Cardiovascular: Negative for chest pain and leg swelling.  Genitourinary: Positive for frequency, urgency and vaginal bleeding. Negative for decreased urine volume,  difficulty urinating, dysuria, flank pain, genital sores, vaginal discharge and vaginal pain.  Musculoskeletal: Negative for back pain and gait problem.  Skin: Negative for rash.  Neurological: Negative for dizziness, light-headedness and headaches.  Psychiatric/Behavioral: Negative for agitation and behavioral problems.  All other systems reviewed and are negative.   Per HPI unless specifically indicated above   Allergies as of 06/29/2016   No Known Allergies     Medication List       Accurate as of 06/29/16  1:09 PM. Always use your most recent med list.          acetaminophen 650 MG CR tablet Commonly known as:  TYLENOL Take 650 mg by mouth every 8 (eight) hours as needed for pain.   albuterol 108 (90 Base) MCG/ACT inhaler Commonly known as:  PROVENTIL HFA;VENTOLIN HFA Inhale 1-2 puffs into the lungs every 6 (six) hours as needed for wheezing or shortness of breath.   chlorproMAZINE 50 MG tablet Commonly known as:  THORAZINE Take 50 mg by mouth as needed. One tablet by mouth PRN agitation, max of 2 tablets, 100 mg in 24 hours   cloZAPine 100 MG tablet Commonly known as:  CLOZARIL Take 250 mg by mouth at bedtime.   cloZAPine 100 MG tablet Commonly known as:  CLOZARIL Take 100 mg by mouth every morning.   entecavir 0.5 MG tablet Commonly known as:  BARACLUDE TAKE (1) TABLET BY MOUTH ONCE DAILY.   eucerin cream Apply 1 application  topically as needed for dry skin.   FISH OIL + D3 1000-1000 MG-UNIT Caps Take 1 capsule by mouth 2 (two) times daily.   FLUoxetine 20 MG capsule Commonly known as:  PROZAC TAKE 3 CAPSULES (60MG) BY MOUTH ONCE DAILY.   fluticasone 50 MCG/ACT nasal spray Commonly known as:  FLONASE SPRAY 2 SPRAYS IN RIGHT NOSTRIL ONLY TWICE DAILY.   ibuprofen 600 MG tablet Commonly known as:  ADVIL,MOTRIN Take 600 mg by mouth every 6 (six) hours as needed.   levonorgestrel 20 MCG/24HR IUD Commonly known as:  MIRENA 1 each by Intrauterine route  once.   multivitamin with minerals Tabs tablet Take 1 tablet by mouth daily.   Vitamin D 2000 units Caps Take 2,000 Units by mouth daily.          Objective:    BP 106/70   Pulse 95   Temp 99.1 F (37.3 C) (Oral)   Ht 5' 7" (1.702 m)   Wt 137 lb (62.1 kg)   BMI 21.46 kg/m   Wt Readings from Last 3 Encounters:  06/29/16 137 lb (62.1 kg)  06/03/16 139 lb 6 oz (63.2 kg)  05/09/16 142 lb (64.4 kg)    Physical Exam  Constitutional: She is oriented to person, place, and time. She appears well-developed and well-nourished. No distress.  Eyes: Conjunctivae are normal.  Cardiovascular: Normal rate, regular rhythm, normal heart sounds and intact distal pulses.   No murmur heard. Pulmonary/Chest: Effort normal and breath sounds normal. No respiratory distress. She has no wheezes.  Abdominal: Soft. Bowel sounds are normal. She exhibits no distension and no mass. There is no tenderness. There is no rebound and no guarding.  Musculoskeletal: Normal range of motion. She exhibits no edema or tenderness.  Neurological: She is alert and oriented to person, place, and time. Coordination normal.  Skin: Skin is warm and dry. No rash noted. She is not diaphoretic.  Psychiatric: She has a normal mood and affect. Her behavior is normal.  Nursing note and vitals reviewed.     Assessment & Plan:   Problem List Items Addressed This Visit      Other   Urinary frequency   Relevant Orders   Urinalysis, Complete   Urine culture   Hyperlipidemia - Primary   Relevant Orders   Lipid panel    Other Visit Diagnoses    Diabetes mellitus screening       Relevant Orders   CBC with Differential/Platelet   CMP14+EGFR   Other fatigue       Relevant Orders   TSH       Follow up plan: Return in about 6 months (around 12/30/2016), or if symptoms worsen or fail to improve, for Recheck cholesterol, urinary issues.  Counseling provided for all of the vaccine components Orders Placed This  Encounter  Procedures  . Urine culture  . CBC with Differential/Platelet  . CMP14+EGFR  . Lipid panel  . TSH  . Urinalysis, Complete    Caryl Pina, MD [facility] 06/29/2016, 1:09 PM

## 2016-06-30 ENCOUNTER — Other Ambulatory Visit (INDEPENDENT_AMBULATORY_CARE_PROVIDER_SITE_OTHER): Payer: Medicare Other

## 2016-06-30 DIAGNOSIS — R350 Frequency of micturition: Secondary | ICD-10-CM | POA: Diagnosis not present

## 2016-06-30 LAB — CMP14+EGFR
A/G RATIO: 1.9 (ref 1.2–2.2)
ALT: 14 IU/L (ref 0–32)
AST: 18 IU/L (ref 0–40)
Albumin: 4.1 g/dL (ref 3.5–5.5)
Alkaline Phosphatase: 76 IU/L (ref 39–117)
BILIRUBIN TOTAL: 0.3 mg/dL (ref 0.0–1.2)
BUN/Creatinine Ratio: 13 (ref 9–23)
BUN: 9 mg/dL (ref 6–24)
CHLORIDE: 104 mmol/L (ref 96–106)
CO2: 25 mmol/L (ref 18–29)
Calcium: 9.1 mg/dL (ref 8.7–10.2)
Creatinine, Ser: 0.69 mg/dL (ref 0.57–1.00)
GFR calc non Af Amer: 106 mL/min/{1.73_m2} (ref >59)
GFR, EST AFRICAN AMERICAN: 122 mL/min/{1.73_m2} (ref >59)
Globulin, Total: 2.2 g/dL (ref 1.5–4.5)
Glucose: 106 mg/dL — ABNORMAL HIGH (ref 65–99)
POTASSIUM: 4 mmol/L (ref 3.5–5.2)
Sodium: 144 mmol/L (ref 134–144)
TOTAL PROTEIN: 6.3 g/dL (ref 6.0–8.5)

## 2016-06-30 LAB — CBC WITH DIFFERENTIAL/PLATELET
Basophils Absolute: 0.1 10*3/uL (ref 0.0–0.2)
Basos: 1 %
EOS (ABSOLUTE): 0.1 10*3/uL (ref 0.0–0.4)
EOS: 1 %
Hematocrit: 39.3 % (ref 34.0–46.6)
Hemoglobin: 12.7 g/dL (ref 11.1–15.9)
Immature Grans (Abs): 0 10*3/uL (ref 0.0–0.1)
Immature Granulocytes: 0 %
LYMPHS ABS: 2 10*3/uL (ref 0.7–3.1)
LYMPHS: 31 %
MCH: 29.2 pg (ref 26.6–33.0)
MCHC: 32.3 g/dL (ref 31.5–35.7)
MCV: 90 fL (ref 79–97)
MONOCYTES: 3 %
MONOS ABS: 0.2 10*3/uL (ref 0.1–0.9)
NEUTROS PCT: 64 %
Neutrophils Absolute: 4.2 10*3/uL (ref 1.4–7.0)
PLATELETS: 219 10*3/uL (ref 150–379)
RBC: 4.35 x10E6/uL (ref 3.77–5.28)
RDW: 14.1 % (ref 12.3–15.4)
WBC: 6.5 10*3/uL (ref 3.4–10.8)

## 2016-06-30 LAB — LIPID PANEL
Chol/HDL Ratio: 2.8 ratio units (ref 0.0–4.4)
Cholesterol, Total: 144 mg/dL (ref 100–199)
HDL: 51 mg/dL (ref >39)
LDL Calculated: 73 mg/dL (ref 0–99)
Triglycerides: 99 mg/dL (ref 0–149)
VLDL Cholesterol Cal: 20 mg/dL (ref 5–40)

## 2016-06-30 LAB — URINALYSIS, COMPLETE
Bilirubin, UA: NEGATIVE
GLUCOSE, UA: NEGATIVE
Ketones, UA: NEGATIVE
Leukocytes, UA: NEGATIVE
Nitrite, UA: NEGATIVE
PH UA: 8.5 — AB (ref 5.0–7.5)
PROTEIN UA: NEGATIVE
RBC, UA: NEGATIVE
Specific Gravity, UA: 1.015 (ref 1.005–1.030)
Urobilinogen, Ur: 0.2 mg/dL (ref 0.2–1.0)

## 2016-06-30 LAB — MICROSCOPIC EXAMINATION
BACTERIA UA: NONE SEEN
RBC MICROSCOPIC, UA: NONE SEEN /HPF (ref 0–?)
RENAL EPITHEL UA: NONE SEEN /HPF
WBC, UA: NONE SEEN /hpf (ref 0–?)

## 2016-06-30 LAB — TSH: TSH: 3.54 u[IU]/mL (ref 0.450–4.500)

## 2016-06-30 NOTE — Addendum Note (Signed)
Addended by: Pollyann Kennedy F on: 06/30/2016 10:13 AM   Modules accepted: Orders

## 2016-07-03 LAB — URINE CULTURE

## 2016-07-29 DIAGNOSIS — F250 Schizoaffective disorder, bipolar type: Secondary | ICD-10-CM | POA: Diagnosis not present

## 2016-08-09 ENCOUNTER — Other Ambulatory Visit: Payer: Medicare Other

## 2016-08-09 DIAGNOSIS — Z79899 Other long term (current) drug therapy: Secondary | ICD-10-CM | POA: Diagnosis not present

## 2016-08-09 DIAGNOSIS — Z5181 Encounter for therapeutic drug level monitoring: Secondary | ICD-10-CM | POA: Diagnosis not present

## 2016-09-01 ENCOUNTER — Other Ambulatory Visit: Payer: Self-pay | Admitting: Family Medicine

## 2016-09-01 DIAGNOSIS — R32 Unspecified urinary incontinence: Secondary | ICD-10-CM

## 2016-09-07 ENCOUNTER — Other Ambulatory Visit: Payer: Self-pay | Admitting: Family Medicine

## 2016-09-07 ENCOUNTER — Other Ambulatory Visit (INDEPENDENT_AMBULATORY_CARE_PROVIDER_SITE_OTHER): Payer: Self-pay | Admitting: Internal Medicine

## 2016-09-07 DIAGNOSIS — Z79899 Other long term (current) drug therapy: Secondary | ICD-10-CM | POA: Diagnosis not present

## 2016-09-07 DIAGNOSIS — B1910 Unspecified viral hepatitis B without hepatic coma: Secondary | ICD-10-CM

## 2016-09-14 ENCOUNTER — Other Ambulatory Visit: Payer: Self-pay | Admitting: Family Medicine

## 2016-09-20 ENCOUNTER — Encounter (INDEPENDENT_AMBULATORY_CARE_PROVIDER_SITE_OTHER): Payer: Self-pay | Admitting: Internal Medicine

## 2016-09-20 ENCOUNTER — Ambulatory Visit (INDEPENDENT_AMBULATORY_CARE_PROVIDER_SITE_OTHER): Payer: Medicare Other | Admitting: Internal Medicine

## 2016-09-20 VITALS — BP 102/68 | HR 76 | Temp 96.5°F | Resp 18 | Ht 63.0 in | Wt 130.1 lb

## 2016-09-20 DIAGNOSIS — B181 Chronic viral hepatitis B without delta-agent: Secondary | ICD-10-CM | POA: Diagnosis not present

## 2016-09-20 DIAGNOSIS — R634 Abnormal weight loss: Secondary | ICD-10-CM

## 2016-09-20 NOTE — Progress Notes (Signed)
Presenting complaint;  Follow-up for chronic hepatitis B.  Database and Subjective:  Margaret [name] is 46 year old female who is here for scheduled visit accompanied by [place] was Scientist, physiological for group home where patient stays. Patient had liver biopsy in in December 2016 revealing son a subtle dilation and increased standing around central veins with reticulin stain. She was begun on entecavir on 04/09/2015. HBV DNA titers became undetectable within 3 months.  She has no complaints. She has good appetite. She denies abdominal pain nausea vomiting skin rash or fatigue. She does walk regularly. She does not take Tylenol or ibuprofen very often. She has lost 12 pounds in the last 6 months.   Current Medications: Outpatient Encounter Prescriptions as of 09/20/2016  Medication Sig  . acetaminophen (TYLENOL) 650 MG CR tablet Take 650 mg by mouth every 8 (eight) hours as needed for pain.  Marland Kitchen albuterol (PROVENTIL HFA;VENTOLIN HFA) 108 (90 BASE) MCG/ACT inhaler Inhale 1-2 puffs into the lungs every 6 (six) hours as needed for wheezing or shortness of breath.  . chlorproMAZINE (THORAZINE) 50 MG tablet Take 50 mg by mouth as needed. One tablet by mouth PRN agitation, max of 2 tablets, 100 mg in 24 hours  . Cholecalciferol (VITAMIN D) 2000 units CAPS Take 1 po every morning  . cloZAPine (CLOZARIL) 100 MG tablet Take 250 mg by mouth at bedtime.  . cloZAPine (CLOZARIL) 100 MG tablet Take 100 mg by mouth every morning.  . entecavir (BARACLUDE) 0.5 MG tablet TAKE (1) TABLET BY MOUTH ONCE DAILY.  Marland Kitchen Fish Oil-Cholecalciferol (FISH OIL + D3) 1000-1000 MG-UNIT CAPS Take 1 po bid  . FLUoxetine (PROZAC) 20 MG capsule TAKE 3 CAPSULES (60MG) BY MOUTH ONCE DAILY.  . fluticasone (FLONASE) 50 MCG/ACT nasal spray SPRAY 2 SPRAYS IN RIGHT NOSTRIL ONLY TWICE DAILY.  Marland Kitchen ibuprofen (ADVIL,MOTRIN) 600 MG tablet Take 600 mg by mouth every 6 (six) hours as needed.  Marland Kitchen levonorgestrel (MIRENA) 20 MCG/24HR IUD 1 each by Intrauterine  route once.  . Multiple Vitamin (MULTIVITAMIN WITH MINERALS) TABS tablet Take 1 tablet by mouth daily.  . Multiple Vitamins-Minerals (MULTIVITAMIN ADULT) TABS Take one po at bedtime  . Skin Protectants, Misc. (EUCERIN) cream Apply 1 application topically as needed for dry skin.   No facility-administered encounter medications on file as of 09/20/2016.      Objective: Blood pressure 102/68, pulse 76, temperature (!) 96.5 F (35.8 C), temperature source Oral, resp. rate 18, height 5' 3"  (1.6 m), weight 130 lb 1.6 oz (59 kg). Patient is alert and in no acute distress. Conjunctiva is pink. Sclera is nonicteric Oropharyngeal mucosa is normal. No neck masses or thyromegaly noted. Cardiac exam with regular rhythm normal S1 and S2. No murmur or gallop noted. Lungs are clear to auscultation. Abdomen is symmetrical soft and nontender without organomegaly or masses.  No LE edema or clubbing noted.  Labs/studies Results: Lab data from 06/29/2016  Bilirubin 0.3, AP 76, AST 18, ALT 14 and albumin 4.1.  WBC 6.5, H&H 12.7 and 39.3 and platelet count 219K.  Lab data from 03/22/2016 HBV DNA titer less than 20 iu/mL   Assessment:  #1. Chronic hepatitis B. Liver biopsy in December 2016 revealed sinus subtle dilation and fibrosis around central veins. HBV DNA is undetectable with entecavir. Transaminases have remained normal. Unless she seroconvert she will be on therapy for at least 5 years and may even be longer.  #2. Weight loss. She has lost 12 pounds since her last visit 6 months ago. Review of for vague  records revealed that she was 129 pounds in August 2016.Since she has no new symptoms will monitor weight trend before further workup undertaken.    Plan:  Continue entecavir at current dose of 0.5 mg by mouth daily. Patient will go to lab for HBV DNA by PCR and HBsAg. Weight check in one month. Office visit in 6 months.

## 2016-09-20 NOTE — Patient Instructions (Signed)
Physician will call with results of blood tests when completed. Weight check in one month to be done at group home and staff will call office.

## 2016-09-21 DIAGNOSIS — F250 Schizoaffective disorder, bipolar type: Secondary | ICD-10-CM | POA: Diagnosis not present

## 2016-09-21 LAB — HEPATITIS B SURFACE ANTIGEN: Hepatitis B Surface Ag: POSITIVE — AB

## 2016-09-21 LAB — HEPATITIS B SURF AG CONFIRMATION: HEPATITIS B SURFACE ANTIGEN CONFIRMATION: POSITIVE — AB

## 2016-09-22 ENCOUNTER — Other Ambulatory Visit: Payer: Self-pay | Admitting: Family Medicine

## 2016-09-22 LAB — HEPATITIS B DNA, ULTRAQUANTITATIVE, PCR: Hepatitis B DNA: <10 IU/mL

## 2016-10-06 ENCOUNTER — Other Ambulatory Visit: Payer: Self-pay | Admitting: Family Medicine

## 2016-10-12 ENCOUNTER — Other Ambulatory Visit: Payer: Self-pay | Admitting: Family Medicine

## 2016-11-04 ENCOUNTER — Other Ambulatory Visit: Payer: Self-pay | Admitting: Family Medicine

## 2016-11-29 DIAGNOSIS — F250 Schizoaffective disorder, bipolar type: Secondary | ICD-10-CM | POA: Diagnosis not present

## 2016-12-01 ENCOUNTER — Telehealth: Payer: Self-pay | Admitting: Family Medicine

## 2016-12-07 ENCOUNTER — Other Ambulatory Visit: Payer: Self-pay | Admitting: Family Medicine

## 2016-12-08 DIAGNOSIS — Z79899 Other long term (current) drug therapy: Secondary | ICD-10-CM | POA: Diagnosis not present

## 2016-12-08 DIAGNOSIS — F250 Schizoaffective disorder, bipolar type: Secondary | ICD-10-CM | POA: Diagnosis not present

## 2016-12-09 NOTE — Telephone Encounter (Signed)
Last seen 06/29/16  Dr Dettinger

## 2016-12-14 NOTE — Telephone Encounter (Signed)
Letter created, put on providers desk for signature

## 2016-12-19 ENCOUNTER — Telehealth: Payer: Self-pay | Admitting: *Deleted

## 2016-12-19 NOTE — Telephone Encounter (Signed)
LMOVM that letter of medical necessity was faxed on 12/15/16

## 2016-12-19 NOTE — Telephone Encounter (Signed)
Updated letter of medical necessity placed on Dr. Merita Norton desk for signature

## 2016-12-30 ENCOUNTER — Ambulatory Visit (INDEPENDENT_AMBULATORY_CARE_PROVIDER_SITE_OTHER): Payer: Medicare Other | Admitting: Family Medicine

## 2016-12-30 ENCOUNTER — Encounter: Payer: Self-pay | Admitting: Family Medicine

## 2016-12-30 VITALS — BP 98/68 | HR 86 | Temp 98.3°F | Ht 63.0 in | Wt 130.0 lb

## 2016-12-30 DIAGNOSIS — E782 Mixed hyperlipidemia: Secondary | ICD-10-CM | POA: Diagnosis not present

## 2016-12-30 DIAGNOSIS — Z23 Encounter for immunization: Secondary | ICD-10-CM | POA: Diagnosis not present

## 2016-12-30 NOTE — Progress Notes (Signed)
   BP 98/68   Pulse 86   Temp 98.3 F (36.8 C) (Oral)   Ht 5' 3" (1.6 m)   Wt 130 lb (59 kg)   BMI 23.03 kg/m    Subjective:    [name], female    DOB: 08-28-1970, 46 y.o.   MRN: [id number]  HPI: Margaret [name] is a 46 y.o. female presenting on 12/30/2016 for Hyperlipidemia (FOLLOW UP)   HPI Hyperlipidemia Patient is coming in for recheck of his hyperlipidemia. The patient is currently taking fish oil 2 g daily. They deny any issues with myalgias or history of liver damage from it. They deny any focal numbness or weakness or chest pain.   Relevant past medical, surgical, family and social history reviewed and updated as indicated. Interim medical history since our last visit reviewed. Allergies and medications reviewed and updated.  Review of Systems  Constitutional: Negative for chills and fever.  Respiratory: Negative for chest tightness and shortness of breath.   Cardiovascular: Negative for chest pain and leg swelling.  Genitourinary: Negative for difficulty urinating, dysuria and frequency.  Musculoskeletal: Negative for back pain, gait problem and myalgias.  Skin: Negative for rash.  Neurological: Negative for light-headedness and headaches.  Psychiatric/Behavioral: Negative for agitation and behavioral problems.  All other systems reviewed and are negative.   Per HPI unless specifically indicated above        Objective:    BP 98/68   Pulse 86   Temp 98.3 F (36.8 C) (Oral)   Ht 5' 3" (1.6 m)   Wt 130 lb (59 kg)   BMI 23.03 kg/m   Wt Readings from Last 3 Encounters:  12/30/16 130 lb (59 kg)  09/20/16 130 lb 1.6 oz (59 kg)  06/29/16 137 lb (62.1 kg)    Physical Exam  Constitutional: She is oriented to person, place, and time. She appears well-developed and well-nourished. No distress.  Eyes: Conjunctivae are normal.  Neck: Neck supple. No thyromegaly present.  Cardiovascular: Normal rate, regular rhythm, normal heart sounds and intact  distal pulses.   No murmur heard. Pulmonary/Chest: Effort normal and breath sounds normal. No respiratory distress. She has no wheezes.  Musculoskeletal: Normal range of motion. She exhibits no edema or tenderness.  Lymphadenopathy:    She has no cervical adenopathy.  Neurological: She is alert and oriented to person, place, and time. Coordination normal.  Skin: Skin is warm and dry. No rash noted. She is not diaphoretic.  Psychiatric: She has a normal mood and affect. Her behavior is normal.  Nursing note and vitals reviewed.     Assessment & Plan:   Problem List Items Addressed This Visit      Other   Hyperlipidemia - Primary   Relevant Orders   Lipid panel   CMP14+EGFR       Follow up plan: Return in about 6 months (around 06/29/2017), or if symptoms worsen or fail to improve, for Physical and fasting labs.  Counseling provided for all of the vaccine components Orders Placed This Encounter  Procedures  . Lipid panel  . [place], MD [facility] 12/30/2016, 9:04 AM

## 2016-12-31 LAB — CMP14+EGFR
A/G RATIO: 2 (ref 1.2–2.2)
ALBUMIN: 4.2 g/dL (ref 3.5–5.5)
ALT: 12 IU/L (ref 0–32)
AST: 17 IU/L (ref 0–40)
Alkaline Phosphatase: 85 IU/L (ref 39–117)
BUN / CREAT RATIO: 10 (ref 9–23)
BUN: 9 mg/dL (ref 6–24)
Bilirubin Total: 0.5 mg/dL (ref 0.0–1.2)
CALCIUM: 9.3 mg/dL (ref 8.7–10.2)
CO2: 25 mmol/L (ref 20–29)
Chloride: 101 mmol/L (ref 96–106)
Creatinine, Ser: 0.89 mg/dL (ref 0.57–1.00)
GFR, EST AFRICAN AMERICAN: 91 mL/min/{1.73_m2} (ref >59)
GFR, EST NON AFRICAN AMERICAN: 79 mL/min/{1.73_m2} (ref >59)
GLOBULIN, TOTAL: 2.1 g/dL (ref 1.5–4.5)
Glucose: 67 mg/dL (ref 65–99)
Potassium: 4 mmol/L (ref 3.5–5.2)
Sodium: 140 mmol/L (ref 134–144)
TOTAL PROTEIN: 6.3 g/dL (ref 6.0–8.5)

## 2016-12-31 LAB — LIPID PANEL
CHOLESTEROL TOTAL: 151 mg/dL (ref 100–199)
Chol/HDL Ratio: 2.6 ratio (ref 0.0–4.4)
HDL: 58 mg/dL (ref >39)
LDL CALC: 79 mg/dL (ref 0–99)
TRIGLYCERIDES: 69 mg/dL (ref 0–149)
VLDL Cholesterol Cal: 14 mg/dL (ref 5–40)

## 2017-01-09 ENCOUNTER — Other Ambulatory Visit: Payer: Self-pay | Admitting: Family Medicine

## 2017-01-18 DIAGNOSIS — F250 Schizoaffective disorder, bipolar type: Secondary | ICD-10-CM | POA: Diagnosis not present

## 2017-01-24 ENCOUNTER — Other Ambulatory Visit: Payer: Self-pay | Admitting: Family Medicine

## 2017-02-14 ENCOUNTER — Other Ambulatory Visit: Payer: Self-pay | Admitting: Family Medicine

## 2017-03-08 ENCOUNTER — Encounter: Payer: Self-pay | Admitting: Family Medicine

## 2017-03-08 ENCOUNTER — Ambulatory Visit (INDEPENDENT_AMBULATORY_CARE_PROVIDER_SITE_OTHER): Payer: Medicare Other | Admitting: Family Medicine

## 2017-03-08 VITALS — BP 93/66 | HR 61 | Temp 97.3°F | Ht 63.0 in | Wt 132.0 lb

## 2017-03-08 DIAGNOSIS — E782 Mixed hyperlipidemia: Secondary | ICD-10-CM

## 2017-03-08 DIAGNOSIS — F201 Disorganized schizophrenia: Secondary | ICD-10-CM

## 2017-03-08 DIAGNOSIS — Z0000 Encounter for general adult medical examination without abnormal findings: Secondary | ICD-10-CM

## 2017-03-08 NOTE — Progress Notes (Signed)
BP 93/66   Pulse 61   Temp (!) 97.3 F (36.3 C) (Oral)   Ht 5' 3"  (1.6 m)   Wt 132 lb (59.9 kg)   BMI 23.38 kg/m    Subjective:    [name], female    DOB: 22-Jun-1970, 46 y.o.   MRN: [id number]  HPI: Margaret [name] is a 46 y.o. female presenting on 03/08/2017 for Annual Exam   HPI Physical exam and cholesterol Patient denies any chest pain, shortness of breath, headaches or vision issues, abdominal complaints, diarrhea, nausea, vomiting, or joint issues. Patient is coming in for recheck of his hyperlipidemia. The patient is currently taking diet control. They deny any issues with myalgias or history of liver damage from it. They deny any focal numbness or weakness or chest pain.   Schizophrenia is currently being managed by Dr. Tamera Punt, patient is feeling more depressed but has an appointment with him in 2 weeks and often feels depressed with the time of year and the season.  Relevant past medical, surgical, family and social history reviewed and updated as indicated. Interim medical history since our last visit reviewed. Allergies and medications reviewed and updated.  Review of Systems  Constitutional: Negative for chills and fever.  HENT: Negative for congestion, ear discharge and ear pain.   Eyes: Negative for redness and visual disturbance.  Respiratory: Negative for chest tightness and shortness of breath.   Cardiovascular: Negative for chest pain and leg swelling.  Genitourinary: Negative for difficulty urinating and dysuria.  Musculoskeletal: Negative for back pain and gait problem.  Skin: Negative for rash.  Neurological: Negative for light-headedness and headaches.  Psychiatric/Behavioral: Negative for agitation and behavioral problems.  All other systems reviewed and are negative.   Per HPI unless specifically indicated above        Objective:    BP 93/66   Pulse 61   Temp (!) 97.3 F (36.3 C) (Oral)   Ht 5' 3"  (1.6 m)   Wt 132 lb  (59.9 kg)   BMI 23.38 kg/m   Wt Readings from Last 3 Encounters:  03/08/17 132 lb (59.9 kg)  12/30/16 130 lb (59 kg)  09/20/16 130 lb 1.6 oz (59 kg)    Physical Exam  Constitutional: She is oriented to person, place, and time. She appears well-developed and well-nourished. No distress.  HENT:  Right Ear: External ear normal.  Left Ear: External ear normal.  Nose: Nose normal.  Mouth/Throat: Oropharynx is clear and moist. No oropharyngeal exudate.  Eyes: Conjunctivae and EOM are normal. Pupils are equal, round, and reactive to light. Scleral icterus (Is currently being treated for hepatitis B) is present.  Neck: Neck supple. No thyromegaly present.  Cardiovascular: Normal rate, regular rhythm, normal heart sounds and intact distal pulses.  No murmur heard. Pulmonary/Chest: Effort normal and breath sounds normal. No respiratory distress. She has no wheezes.  Abdominal: Soft. Bowel sounds are normal. She exhibits no distension. There is no tenderness. There is no rebound and no guarding.  Musculoskeletal: Normal range of motion. She exhibits no edema.  Lymphadenopathy:    She has no cervical adenopathy.  Neurological: She is alert and oriented to person, place, and time. Coordination normal.  Skin: Skin is warm and dry. No rash noted. She is not diaphoretic.  Psychiatric: She has a normal mood and affect. Her behavior is normal.  Nursing note and vitals reviewed.   Results for orders placed or performed in visit on 12/30/16  Lipid panel  Result  Value Ref Range   Cholesterol, Total 151 100 - 199 mg/dL   Triglycerides 69 0 - 149 mg/dL   HDL 58 >39 mg/dL   VLDL Cholesterol Cal 14 5 - 40 mg/dL   LDL Calculated 79 0 - 99 mg/dL   Chol/HDL Ratio 2.6 0.0 - 4.4 ratio  CMP14+EGFR  Result Value Ref Range   Glucose 67 65 - 99 mg/dL   BUN 9 6 - 24 mg/dL   Creatinine, Ser 0.89 0.57 - 1.00 mg/dL   GFR calc non Af Amer 79 >59 mL/min/1.73   GFR calc Af Amer 91 >59 mL/min/1.73    BUN/Creatinine Ratio 10 9 - 23   Sodium 140 134 - 144 mmol/L   Potassium 4.0 3.5 - 5.2 mmol/L   Chloride 101 96 - 106 mmol/L   CO2 25 20 - 29 mmol/L   Calcium 9.3 8.7 - 10.2 mg/dL   Total Protein 6.3 6.0 - 8.5 g/dL   Albumin 4.2 3.5 - 5.5 g/dL   Globulin, Total 2.1 1.5 - 4.5 g/dL   Albumin/Globulin Ratio 2.0 1.2 - 2.2   Bilirubin Total 0.5 0.0 - 1.2 mg/dL   Alkaline Phosphatase 85 39 - 117 IU/L   AST 17 0 - 40 IU/L   ALT 12 0 - 32 IU/L      Assessment & Plan:   Problem List Items Addressed This Visit      Other   Hyperlipidemia   Schizophrenia ([place])    Other Visit Diagnoses    Physical exam    -  Primary       Follow up plan: Return in about 6 months (around 09/06/2017), or if symptoms worsen or fail to improve, for Hyperlipidemia recheck.  Counseling provided for all of the vaccine components No orders of the defined types were placed in this encounter.   Caryl Pina, MD [facility] 03/08/2017, 11:07 AM

## 2017-03-10 ENCOUNTER — Other Ambulatory Visit: Payer: Self-pay | Admitting: Family Medicine

## 2017-03-10 ENCOUNTER — Other Ambulatory Visit (INDEPENDENT_AMBULATORY_CARE_PROVIDER_SITE_OTHER): Payer: Self-pay | Admitting: Internal Medicine

## 2017-03-10 DIAGNOSIS — B1910 Unspecified viral hepatitis B without hepatic coma: Secondary | ICD-10-CM

## 2017-03-14 ENCOUNTER — Other Ambulatory Visit: Payer: Self-pay | Admitting: Family Medicine

## 2017-03-16 ENCOUNTER — Telehealth (INDEPENDENT_AMBULATORY_CARE_PROVIDER_SITE_OTHER): Payer: Self-pay | Admitting: Internal Medicine

## 2017-03-16 NOTE — Telephone Encounter (Signed)
RX has been sent to the patient.

## 2017-03-16 NOTE — Telephone Encounter (Signed)
[facility] called for a refill request for Entecavir .52m one a day that they stated they have sent a couple times electronically.    [phone]

## 2017-03-17 ENCOUNTER — Other Ambulatory Visit (INDEPENDENT_AMBULATORY_CARE_PROVIDER_SITE_OTHER): Payer: Self-pay | Admitting: *Deleted

## 2017-03-17 DIAGNOSIS — B1911 Unspecified viral hepatitis B with hepatic coma: Secondary | ICD-10-CM

## 2017-03-17 DIAGNOSIS — B160 Acute hepatitis B with delta-agent with hepatic coma: Secondary | ICD-10-CM

## 2017-03-17 DIAGNOSIS — B181 Chronic viral hepatitis B without delta-agent: Secondary | ICD-10-CM

## 2017-03-21 ENCOUNTER — Encounter (INDEPENDENT_AMBULATORY_CARE_PROVIDER_SITE_OTHER): Payer: Self-pay | Admitting: Internal Medicine

## 2017-03-21 ENCOUNTER — Ambulatory Visit (INDEPENDENT_AMBULATORY_CARE_PROVIDER_SITE_OTHER): Payer: Medicare Other | Admitting: Internal Medicine

## 2017-03-21 VITALS — BP 102/66 | HR 72 | Temp 97.6°F | Resp 18 | Ht 63.0 in | Wt 131.7 lb

## 2017-03-21 DIAGNOSIS — B181 Chronic viral hepatitis B without delta-agent: Secondary | ICD-10-CM

## 2017-03-21 NOTE — Progress Notes (Signed)
Presenting complaint;  Follow-up for chronic hepatitis B.    Database and subjective:  Margaret [name] is 46 year old Caucasian female who is here for scheduled visit accompanied by Ms. [place] Wilson who manages group home. Patient was last seen 6 months ago. She has no complaints.  She has very good appetite.  She has gained 1 pound since her last visit.  Lately she has been feeling sad.  According to Ms. Redmond Pulling she has had spells like this in the past that she feels sad between Thanksgiving and Christmas.  Her parents live in [place].  She will be going out with them to eat for Christmas.  She denies nausea vomiting abdominal pain pruritus melena or rectal bleeding.  She walks regularly.  Ms. Redmond Pulling feels weight loss in the past has been due to excessive walking which she does secondary to her psychiatric illness. She did not have blood work as planned but she will have it today.   Current Medications: Outpatient Encounter Medications as of 03/21/2017  Medication Sig  . acetaminophen (TYLENOL) 650 MG CR tablet Take 650 mg by mouth every 8 (eight) hours as needed for pain.  . chlorproMAZINE (THORAZINE) 50 MG tablet Take 50 mg by mouth as needed. One tablet by mouth PRN agitation, max of 2 tablets, 100 mg in 24 hours  . Cholecalciferol (VITAMIN D) 2000 units CAPS TAKE 1 CAPSULE BY MOUTH EVERY MORNING.  . cloZAPine (CLOZARIL) 100 MG tablet Take 250 mg by mouth at bedtime.  . cloZAPine (CLOZARIL) 100 MG tablet Take 150 mg by mouth every morning.   . entecavir (BARACLUDE) 0.5 MG tablet TAKE (1) TABLET BY MOUTH ONCE DAILY.  Marland Kitchen Fish Oil-Cholecalciferol (FISH OIL + D3) 1000-1000 MG-UNIT CAPS Take 1 po bid  . FLUoxetine (PROZAC) 20 MG capsule TAKE 3 CAPSULES BY MOUTH ONCE DAILY.  . fluticasone (FLONASE) 50 MCG/ACT nasal spray SPRAY 2 SPRAYS IN RIGHT NOSTRIL ONLY TWICE DAILY.  . IBU 600 MG tablet TAKE 1 TABLET BY MOUTH WITH FOOD 4 TIMES DAILY AS NEEDED FOR ACTIVE MODERATE PAIN UNRELIEVED BY  ACETAMINOPHEN. MAX 2 DOSES/24 HR.  Marland Kitchen levonorgestrel (MIRENA) 20 MCG/24HR IUD 1 each by Intrauterine route once.  . Multiple Vitamin (MULTIVITAMIN WITH MINERALS) TABS tablet Take 1 tablet by mouth daily.  . Skin Protectants, Misc. (MINERIN) CREA APPLY MODERATE AMOUNT TOPICALLY TO DRY SKIN ON SOLES & HEELS OF BOTH FEET AFTER SHOWER. (NURSING STAFF TO SUPERVISE APPLICATION)  . VENTOLIN HFA 108 (90 Base) MCG/ACT inhaler INHALE 2 PUFFS EVERY 4 HOURS AS NEEDED FOR WHEEZING.   No facility-administered encounter medications on file as of 03/21/2017.      Objective: Blood pressure 102/66, pulse 72, temperature 97.6 F (36.4 C), temperature source Oral, resp. rate 18, height 5' 3"  (1.6 m), weight 131 lb 11.2 oz (59.7 kg). Patient is alert and in no acute distress. Conjunctiva is pink. Sclera is nonicteric Oropharyngeal mucosa is normal. No neck masses or thyromegaly noted. Cardiac exam with regular rhythm normal S1 and S2. No murmur or gallop noted. Lungs are clear to auscultation. Abdomen is flat and soft without tenderness organomegaly or masses. No LE edema or clubbing noted.  Labs/studies Results:  Lab data from 09/20/2016 Hepatitis B surface antigen positive HBV DNA undetectable.  Pretreatment HBV DNA was 13,596 IU/mL  Assessment:  #1.  Hepatitis e antigen negative chronic hepatitis B.  Patient would be completing 2 years on entecavir in 3 weeks.  Treatment liver biopsy showed sinusoidal dilation and process around central veins [place] reticulin stains.  Therapy has been effective in suppressing viral activity but she has not yet seroconverted.  Is not having any side effects with therapy.  #2.  Weight loss.  She has not lost any weight since her last visit.  She has good appetite.  It appears weight loss in the past has been related to psychiatric illness and increased physical activity/walking.   Plan:  Patient will go to the lab for CBC, HPV DNA by PCR.   Continue entecavir at  current dose of 0.5 mg p.o. Daily. Office visit in 6 months.

## 2017-03-21 NOTE — Patient Instructions (Signed)
Physician  will call with results of blood test when completed.

## 2017-03-23 DIAGNOSIS — F250 Schizoaffective disorder, bipolar type: Secondary | ICD-10-CM | POA: Diagnosis not present

## 2017-04-19 ENCOUNTER — Other Ambulatory Visit (HOSPITAL_COMMUNITY)
Admission: RE | Admit: 2017-04-19 | Discharge: 2017-04-19 | Disposition: A | Discharge status: Home / Self Care | Payer: Medicare Other | Source: Ambulatory Visit | Attending: Internal Medicine | Admitting: Internal Medicine

## 2017-04-19 DIAGNOSIS — B181 Chronic viral hepatitis B without delta-agent: Secondary | ICD-10-CM | POA: Diagnosis not present

## 2017-04-19 DIAGNOSIS — B1911 Unspecified viral hepatitis B with hepatic coma: Secondary | ICD-10-CM | POA: Insufficient documentation

## 2017-04-19 LAB — HEPATIC FUNCTION PANEL
ALT: 13 U/L — AB (ref 14–54)
AST: 14 U/L — ABNORMAL LOW (ref 15–41)
Albumin: 3.9 g/dL (ref 3.5–5.0)
Alkaline Phosphatase: 72 U/L (ref 38–126)
BILIRUBIN DIRECT: 0.1 mg/dL (ref 0.1–0.5)
Indirect Bilirubin: 0.4 mg/dL (ref 0.3–0.9)
Total Bilirubin: 0.5 mg/dL (ref 0.3–1.2)
Total Protein: 6.8 g/dL (ref 6.5–8.1)

## 2017-04-19 LAB — CBC
HCT: 41.9 % (ref 36.0–46.0)
HEMOGLOBIN: 13.3 g/dL (ref 12.0–15.0)
MCH: 29.8 pg (ref 26.0–34.0)
MCHC: 31.7 g/dL (ref 30.0–36.0)
MCV: 93.7 fL (ref 78.0–100.0)
PLATELETS: 242 10*3/uL (ref 150–400)
RBC: 4.47 MIL/uL (ref 3.87–5.11)
RDW: 12.8 % (ref 11.5–15.5)
WBC: 10.5 10*3/uL (ref 4.0–10.5)

## 2017-04-20 ENCOUNTER — Ambulatory Visit (INDEPENDENT_AMBULATORY_CARE_PROVIDER_SITE_OTHER): Payer: Medicare Other | Admitting: *Deleted

## 2017-04-20 ENCOUNTER — Encounter: Payer: Self-pay | Admitting: *Deleted

## 2017-04-20 VITALS — BP 103/67 | HR 92 | Ht 63.0 in | Wt 133.0 lb

## 2017-04-20 DIAGNOSIS — Z0000 Encounter for general adult medical examination without abnormal findings: Secondary | ICD-10-CM | POA: Diagnosis not present

## 2017-04-20 LAB — HEPATITIS B DNA, ULTRAQUANTITATIVE, PCR
HBV DNA SERPL PCR-ACNC: NOT DETECTED IU/mL
HBV DNA SERPL PCR-LOG IU: UNDETERMINED log10 IU/mL

## 2017-04-20 NOTE — Patient Instructions (Signed)
  [name] , Thank you for taking time to come for your Medicare Wellness Visit. I appreciate your ongoing commitment to your health goals. Please review the following plan we discussed and let me know if I can assist you in the future.   These are the goals we discussed: Continue to stay active. Walking everyday is a good way to stay healthy.   Continue to eat three meals a day.   This is a list of the screening recommended for you and due dates:  Health Maintenance  Topic Date Due  . Pap Smear  02/22/2018  . Tetanus Vaccine  06/30/2026  . Flu Shot  Completed  . HIV Screening  Completed

## 2017-04-20 NOTE — Progress Notes (Signed)
Subjective:   Margaret [name] is a 47 y.o. female who presents for an Initial Medicare Annual Wellness Visit. Margaret [name] is adopted and moved with her family from [place] when she was a teenager. She does not have any children and has never been married.She is disabled due to schizophrenia and lives in a group home.   Review of Systems    Health is about the same as last year.   Cardiac Risk Factors include: none  Other systems negative today.     Objective:    Today's Vitals   04/20/17 0854  BP: 103/67  Pulse: 92  Weight: 133 lb (60.3 kg)  Height: 5' 3"  (1.6 m)   Body mass index is 23.56 kg/m.  Advanced Directives 05/09/2016 03/24/2015  Does Patient Have a Medical Advance Directive? (No Data) No  Would patient like information on creating a medical advance directive? (No Data) No - patient declined information    Current Medications (verified) Outpatient Encounter Medications as of 04/20/2017  Medication Sig  . acetaminophen (TYLENOL) 650 MG CR tablet Take 650 mg by mouth every 8 (eight) hours as needed for pain.  . chlorproMAZINE (THORAZINE) 50 MG tablet Take 50 mg by mouth as needed. One tablet by mouth PRN agitation, max of 2 tablets, 100 mg in 24 hours  . Cholecalciferol (VITAMIN D) 2000 units CAPS TAKE 1 CAPSULE BY MOUTH EVERY MORNING.  . cloZAPine (CLOZARIL) 100 MG tablet Take 250 mg by mouth at bedtime.  . cloZAPine (CLOZARIL) 100 MG tablet Take 150 mg by mouth every morning.   . entecavir (BARACLUDE) 0.5 MG tablet TAKE (1) TABLET BY MOUTH ONCE DAILY.  Marland Kitchen Fish Oil-Cholecalciferol (FISH OIL + D3) 1000-1000 MG-UNIT CAPS Take 1 po bid  . FLUoxetine (PROZAC) 20 MG capsule TAKE 3 CAPSULES BY MOUTH ONCE DAILY.  . fluticasone (FLONASE) 50 MCG/ACT nasal spray SPRAY 2 SPRAYS IN RIGHT NOSTRIL ONLY TWICE DAILY.  . IBU 600 MG tablet TAKE 1 TABLET BY MOUTH WITH FOOD 4 TIMES DAILY AS NEEDED FOR ACTIVE MODERATE PAIN UNRELIEVED BY ACETAMINOPHEN. MAX 2 DOSES/24 HR.  Marland Kitchen levonorgestrel  (MIRENA) 20 MCG/24HR IUD 1 each by Intrauterine route once.  . Multiple Vitamin (MULTIVITAMIN WITH MINERALS) TABS tablet Take 1 tablet by mouth daily.  . Skin Protectants, Misc. (MINERIN) CREA APPLY MODERATE AMOUNT TOPICALLY TO DRY SKIN ON SOLES & HEELS OF BOTH FEET AFTER SHOWER. (NURSING STAFF TO SUPERVISE APPLICATION)  . VENTOLIN HFA 108 (90 Base) MCG/ACT inhaler INHALE 2 PUFFS EVERY 4 HOURS AS NEEDED FOR WHEEZING.   No facility-administered encounter medications on file as of 04/20/2017.     Allergies (verified) Patient has no known allergies.   History: Past Medical History:  Diagnosis Date  . Hematuria 11/12/2014  . Hepatitis B carrier ([place])   . Hypothyroidism   . Impulse disorder   . IUD (intrauterine device) in place 11/12/2014   Inserted 12/24/12 at [facility]  . Mild intellectual disability   . Schizophrenia ([place])   . Schizophrenia ([place])   . Tuberculosis    as a child  . Urinary frequency 11/12/2014   Past Surgical History:  Procedure Laterality Date  . ARM WOUND REPAIR / CLOSURE    . TRACHEAL SURGERY    . TRACHEOSTOMY     Family History  Adopted: Yes  Family history unknown: Yes   Social History   Socioeconomic History  . Marital status: Single    Spouse name: Not on file  . Number of children: 0  . Years  of education: 14  . Highest education level: High school graduate  Social Needs  . Financial resource strain: Not hard at all  . Food insecurity - worry: Never true  . Food insecurity - inability: Never true  . Transportation needs - medical: No  . Transportation needs - non-medical: No  Occupational History  . Occupation: disabled    Comment: due to schizophrenia  Tobacco Use  . Smoking status: Former Smoker    Packs/day: 1.00    Years: 24.00    Pack years: 24.00    Types: Cigarettes    Last attempt to quit: 11/03/2010    Years since quitting: 6.4  . Smokeless tobacco: Never Used  Substance and Sexual Activity  . Alcohol use: No    Alcohol/week: 0.0 oz    . Drug use: No  . Sexual activity: No    Birth control/protection: IUD  Other Topics Concern  . Not on file  Social History Narrative   Patient is adopted. She does not have any children and has never been married. She lives in a group home and is disabled. Has a diagnosis of schizophrenia.     Tobacco Counseling Counseling given: Not Answered   Clinical Intake:     Pain : No/denies pain     Nutritional Status: BMI of 19-24  Normal  How often do you need to have someone help you when you read instructions, pamphlets, or other written materials from your doctor or pharmacy?: 2 - Rarely What is the last grade level you completed in school?: 12th grade  Interpreter Needed?: No  Information entered by :: Chong Sicilian, RN   Activities of Daily Living In your present state of health, do you have any difficulty performing the following activities: 04/20/2017  Hearing? N  Vision? N  Comment due for eye exam. No difficulty with vision.   Difficulty concentrating or making decisions? N  Walking or climbing stairs? N  Dressing or bathing? N  Doing errands, shopping? Y  Preparing Food and eating ? Y  Comment Patient is able to feed herself but does not prepare food. Food isprepared by group home staff  Using the Toilet? N  In the past six months, have you accidently leaked urine? Y  Comment Wears depends. Is able to control bladder but wears them because of intentional urination. This is a behavioral problem.   Do you have problems with loss of bowel control? N  Managing your Medications? Y  Comment medications are managed by group home staff  Managing your Finances? Y  Comment Managed by group home staff  Housekeeping or managing your Housekeeping? N  Comment patient tidies her room  Some recent data might be hidden     Immunizations and Health Maintenance Immunization History  Administered Date(s) Administered  . Influenza,inj,Quad PF,6+ Mos 12/30/2015, 12/30/2016   . Influenza-Unspecified 01/12/2015   There are no preventive care reminders to display for this patient.  Patient Care Team: Dettinger, Fransisca Kaufmann, MD as PCP - General (Family Medicine)  Indicate any recent Medical Services you may have received from other than [facility] providers in the past year (date may be approximate).     Assessment:   This is a routine wellness examination for Margaret [name].  Hearing/Vision screen No exam data present  Dietary issues and exercise activities discussed: Current Exercise Habits: Home exercise routine, Type of exercise: walking, Time (Minutes): 60, Frequency (Times/Week): 7, Weekly Exercise (Minutes/Week): 420, Intensity: Mild, Exercise limited by: psychological condition(s)  Diet Eats 3  meals a day prepared by the group home staff.   Goals 150 min of moderate physical activity a week  Depression Screen PHQ 2/9 Scores 04/20/2017 03/08/2017 12/30/2016 06/29/2016 06/03/2016 03/23/2016 12/30/2015  PHQ - 2 Score - 5 1 4 6 6 6   PHQ- 9 Score - 18 - 19 22 24 21   Exception Documentation Other- indicate reason in comment box - - - - - -  Not completed Difficult to screen due to schizophrenia - - - - - -    Fall Risk Fall Risk  04/20/2017 03/23/2016 11/17/2014  Falls in the past year? No No No   Cognitive Function:    Unable to complete     Screening Tests Health Maintenance  Topic Date Due  . PAP SMEAR  02/22/2018  . TETANUS/TDAP  06/30/2026  . INFLUENZA VACCINE  Completed  . HIV Screening  Completed     Plan:   continue to stay active by walking daily Keep f/u with PCP     I have personally reviewed and noted the following in the patient's chart:   . Medical and social history . Use of alcohol, tobacco or illicit drugs  . Current medications and supplements . Functional ability and status . Nutritional status . Physical activity . Advanced directives . List of other physicians . Hospitalizations, surgeries, and ER visits in previous 12  months . Vitals . Screenings to include cognitive, depression, and falls . Referrals and appointments  In addition, I have reviewed and discussed with patient certain preventive protocols, quality metrics, and best practice recommendations. A written personalized care plan for preventive services as well as general preventive health recommendations were provided to patient.    Chong Sicilian, RN  04/20/2017

## 2017-05-03 ENCOUNTER — Other Ambulatory Visit: Payer: Self-pay | Admitting: Family Medicine

## 2017-05-03 DIAGNOSIS — Z1231 Encounter for screening mammogram for malignant neoplasm of breast: Secondary | ICD-10-CM

## 2017-05-10 ENCOUNTER — Ambulatory Visit (HOSPITAL_COMMUNITY)
Admission: RE | Admit: 2017-05-10 | Discharge: 2017-05-10 | Disposition: A | Discharge status: Home / Self Care | Payer: Medicare Other | Source: Ambulatory Visit | Attending: Family Medicine | Admitting: Family Medicine

## 2017-05-10 DIAGNOSIS — Z1231 Encounter for screening mammogram for malignant neoplasm of breast: Secondary | ICD-10-CM

## 2017-06-13 DIAGNOSIS — F250 Schizoaffective disorder, bipolar type: Secondary | ICD-10-CM | POA: Diagnosis not present

## 2017-06-29 ENCOUNTER — Ambulatory Visit: Payer: Medicare Other | Admitting: Family Medicine

## 2017-07-11 DIAGNOSIS — F250 Schizoaffective disorder, bipolar type: Secondary | ICD-10-CM | POA: Diagnosis not present

## 2017-09-04 DIAGNOSIS — Z79899 Other long term (current) drug therapy: Secondary | ICD-10-CM | POA: Diagnosis not present

## 2017-09-06 ENCOUNTER — Ambulatory Visit (INDEPENDENT_AMBULATORY_CARE_PROVIDER_SITE_OTHER): Payer: Medicare Other | Admitting: Family Medicine

## 2017-09-06 ENCOUNTER — Encounter: Payer: Self-pay | Admitting: Family Medicine

## 2017-09-06 VITALS — BP 84/65 | HR 83 | Temp 97.0°F | Ht 63.0 in | Wt 129.6 lb

## 2017-09-06 DIAGNOSIS — E782 Mixed hyperlipidemia: Secondary | ICD-10-CM | POA: Diagnosis not present

## 2017-09-06 DIAGNOSIS — R350 Frequency of micturition: Secondary | ICD-10-CM

## 2017-09-06 DIAGNOSIS — F201 Disorganized schizophrenia: Secondary | ICD-10-CM

## 2017-09-06 NOTE — Progress Notes (Signed)
BP (!) 84/65   Pulse 83   Temp (!) 97 F (36.1 C) (Oral)   Ht 5' 3"  (1.6 m)   Wt 129 lb 9.6 oz (58.8 kg)   BMI 22.96 kg/m    Subjective:    [name], female    DOB: 01/20/1971, 47 y.o.   MRN: [id number]  HPI: Margaret [name] is a 47 y.o. female presenting on 09/06/2017 for Hyperlipidemia (6 month follow up)   HPI Hyperlipidemia Patient is coming in for recheck of his hyperlipidemia. The patient is currently taking fish oil. They deny any issues with myalgias or history of liver damage from it. They deny any focal numbness or weakness or chest pain.   Patient has had urinary frequency was they thought was associated with her psychiatric disorders, she has been wearing depends but has been improved.  We tested multiple urines in the past and did not initially find infections associated with this.  Relevant past medical, surgical, family and social history reviewed and updated as indicated. Interim medical history since our last visit reviewed. Allergies and medications reviewed and updated.  Review of Systems  Constitutional: Negative for chills and fever.  Eyes: Negative for redness and visual disturbance.  Respiratory: Negative for chest tightness and shortness of breath.   Cardiovascular: Negative for chest pain and leg swelling.  Genitourinary: Negative for difficulty urinating and dysuria.  Musculoskeletal: Negative for back pain and gait problem.  Skin: Negative for rash.  Neurological: Negative for light-headedness and headaches.  Psychiatric/Behavioral: Negative for agitation and behavioral problems.  All other systems reviewed and are negative.   Per HPI unless specifically indicated above   Allergies as of 09/06/2017   No Known Allergies     Medication List        Accurate as of 09/06/17 11:13 AM. Always use your most recent med list.          acetaminophen 650 MG CR tablet Commonly known as:  TYLENOL Take 650 mg by mouth every 8 (eight)  hours as needed for pain.   chlorproMAZINE 50 MG tablet Commonly known as:  THORAZINE Take 50 mg by mouth as needed. One tablet by mouth PRN agitation, max of 2 tablets, 100 mg in 24 hours   cloZAPine 100 MG tablet Commonly known as:  CLOZARIL Take 250 mg by mouth at bedtime.   cloZAPine 100 MG tablet Commonly known as:  CLOZARIL Take 150 mg by mouth every morning.   entecavir 0.5 MG tablet Commonly known as:  BARACLUDE TAKE (1) TABLET BY MOUTH ONCE DAILY.   FISH OIL + D3 1000-1000 MG-UNIT Caps Take 1 po bid   FLUoxetine 20 MG capsule Commonly known as:  PROZAC TAKE 3 CAPSULES BY MOUTH ONCE DAILY.   fluticasone 50 MCG/ACT nasal spray Commonly known as:  FLONASE SPRAY 2 SPRAYS IN RIGHT NOSTRIL ONLY TWICE DAILY.   IBU 600 MG tablet Generic drug:  ibuprofen TAKE 1 TABLET BY MOUTH WITH FOOD 4 TIMES DAILY AS NEEDED FOR ACTIVE MODERATE PAIN UNRELIEVED BY ACETAMINOPHEN. MAX 2 DOSES/24 HR.   levonorgestrel 20 MCG/24HR IUD Commonly known as:  MIRENA 1 each by Intrauterine route once.   MINERIN Crea APPLY MODERATE AMOUNT TOPICALLY TO DRY SKIN ON SOLES & HEELS OF BOTH FEET AFTER SHOWER. (NURSING STAFF TO SUPERVISE APPLICATION)   multivitamin with minerals Tabs tablet Take 1 tablet by mouth daily.   VENTOLIN HFA 108 (90 Base) MCG/ACT inhaler Generic drug:  albuterol INHALE 2 PUFFS EVERY 4 HOURS  AS NEEDED FOR WHEEZING.   Vitamin D 2000 units Caps TAKE 1 CAPSULE BY MOUTH EVERY MORNING.          Objective:    BP (!) 84/65   Pulse 83   Temp (!) 97 F (36.1 C) (Oral)   Ht 5' 3"  (1.6 m)   Wt 129 lb 9.6 oz (58.8 kg)   BMI 22.96 kg/m   Wt Readings from Last 3 Encounters:  09/06/17 129 lb 9.6 oz (58.8 kg)  04/20/17 133 lb (60.3 kg)  03/21/17 131 lb 11.2 oz (59.7 kg)    Physical Exam  Constitutional: She is oriented to person, place, and time. She appears well-developed and well-nourished. No distress.  Eyes: Pupils are equal, round, and reactive to light.  Conjunctivae and EOM are normal.  Neck: Neck supple. No thyromegaly present.  Cardiovascular: Normal rate, regular rhythm, normal heart sounds and intact distal pulses.  No murmur heard. Pulmonary/Chest: Effort normal and breath sounds normal. No respiratory distress. She has no wheezes.  Abdominal: There is no tenderness.  Musculoskeletal: Normal range of motion. She exhibits no edema.  Lymphadenopathy:    She has no cervical adenopathy.  Neurological: She is alert and oriented to person, place, and time. Coordination normal.  Skin: Skin is warm and dry. No rash noted. She is not diaphoretic.  Psychiatric: She has a normal mood and affect. Her behavior is normal.  Nursing note and vitals reviewed.       Assessment & Plan:   Problem List Items Addressed This Visit      Other   Urinary frequency   Relevant Orders   CMP14+EGFR   Hyperlipidemia - Primary   Relevant Orders   Lipid panel   Schizophrenia ([place])   Relevant Orders   CBC with Differential/Platelet       Follow up plan: Return in about 6 months (around 03/08/2018), or if symptoms worsen or fail to improve, for Follow-up cholesterol.  Counseling provided for all of the vaccine components Orders Placed This Encounter  Procedures  . CBC with Differential/Platelet  . CMP14+EGFR  . Lipid panel    Caryl Pina, MD [facility] 09/06/2017, 11:13 AM

## 2017-09-07 LAB — CBC WITH DIFFERENTIAL/PLATELET
BASOS ABS: 0.1 10*3/uL (ref 0.0–0.2)
Basos: 1 %
EOS (ABSOLUTE): 0.2 10*3/uL (ref 0.0–0.4)
EOS: 2 %
HEMATOCRIT: 39.6 % (ref 34.0–46.6)
Hemoglobin: 12.9 g/dL (ref 11.1–15.9)
IMMATURE GRANULOCYTES: 0 %
Immature Grans (Abs): 0 10*3/uL (ref 0.0–0.1)
Lymphocytes Absolute: 2.5 10*3/uL (ref 0.7–3.1)
Lymphs: 29 %
MCH: 29.9 pg (ref 26.6–33.0)
MCHC: 32.6 g/dL (ref 31.5–35.7)
MCV: 92 fL (ref 79–97)
MONOS ABS: 0.3 10*3/uL (ref 0.1–0.9)
Monocytes: 4 %
NEUTROS PCT: 64 %
Neutrophils Absolute: 5.6 10*3/uL (ref 1.4–7.0)
PLATELETS: 262 10*3/uL (ref 150–450)
RBC: 4.31 x10E6/uL (ref 3.77–5.28)
RDW: 12.8 % (ref 12.3–15.4)
WBC: 8.7 10*3/uL (ref 3.4–10.8)

## 2017-09-07 LAB — CMP14+EGFR
ALT: 14 IU/L (ref 0–32)
AST: 20 IU/L (ref 0–40)
Albumin/Globulin Ratio: 2.2 (ref 1.2–2.2)
Albumin: 4.3 g/dL (ref 3.5–5.5)
Alkaline Phosphatase: 79 IU/L (ref 39–117)
BUN/Creatinine Ratio: 13 (ref 9–23)
BUN: 9 mg/dL (ref 6–24)
Bilirubin Total: 0.2 mg/dL (ref 0.0–1.2)
CO2: 27 mmol/L (ref 20–29)
CREATININE: 0.71 mg/dL (ref 0.57–1.00)
Calcium: 9 mg/dL (ref 8.7–10.2)
Chloride: 105 mmol/L (ref 96–106)
GFR calc Af Amer: 119 mL/min/{1.73_m2} (ref >59)
GFR, EST NON AFRICAN AMERICAN: 103 mL/min/{1.73_m2} (ref >59)
GLUCOSE: 98 mg/dL (ref 65–99)
Globulin, Total: 2 g/dL (ref 1.5–4.5)
Potassium: 3.9 mmol/L (ref 3.5–5.2)
Sodium: 143 mmol/L (ref 134–144)
Total Protein: 6.3 g/dL (ref 6.0–8.5)

## 2017-09-07 LAB — LIPID PANEL
CHOL/HDL RATIO: 2.7 ratio (ref 0.0–4.4)
CHOLESTEROL TOTAL: 131 mg/dL (ref 100–199)
HDL: 48 mg/dL (ref >39)
LDL CALC: 61 mg/dL (ref 0–99)
TRIGLYCERIDES: 112 mg/dL (ref 0–149)
VLDL Cholesterol Cal: 22 mg/dL (ref 5–40)

## 2017-09-12 ENCOUNTER — Other Ambulatory Visit (INDEPENDENT_AMBULATORY_CARE_PROVIDER_SITE_OTHER): Payer: Self-pay | Admitting: Internal Medicine

## 2017-09-12 DIAGNOSIS — B1910 Unspecified viral hepatitis B without hepatic coma: Secondary | ICD-10-CM

## 2017-09-19 ENCOUNTER — Encounter (INDEPENDENT_AMBULATORY_CARE_PROVIDER_SITE_OTHER): Payer: Self-pay | Admitting: Internal Medicine

## 2017-09-19 ENCOUNTER — Ambulatory Visit (INDEPENDENT_AMBULATORY_CARE_PROVIDER_SITE_OTHER): Payer: Medicare Other | Admitting: Internal Medicine

## 2017-09-19 VITALS — BP 100/68 | HR 72 | Temp 98.7°F | Resp 18 | Ht 63.0 in | Wt 130.0 lb

## 2017-09-19 DIAGNOSIS — B182 Chronic viral hepatitis C: Secondary | ICD-10-CM

## 2017-09-19 DIAGNOSIS — K7400 Hepatic fibrosis, unspecified: Secondary | ICD-10-CM

## 2017-09-19 NOTE — Progress Notes (Signed)
Presenting complaint;  Follow-up for chronic hepatitis B.  Database and subjective:  Patient is a 47 year old female who has chronic hepatitis B. Ultrasound with elastography in August 2016 revealed fibrosis score between F2 and F3. She had liver biopsy in December 2016 revealing mild sinusoidal dilation and fibrosis around central veins with reticulin stain but not with trichrome. She was begun on entecavir on 04/09/2015.  HBV DNA titers became undetectable within 3 months of therapy. She was last seen 6 months ago.  She is accompanied by Ms. [place] Wilson who is Scientist, physiological at the group home.  Patient has no complaints.  She has very good appetite.  She denies abdominal pain pruritus or weakness.  Bowels move daily.  She denies melena or rectal bleeding.  She lives in a group home with 5 other residents and she is the youngest of the 66.  She loves to walk.  She walks for about an hour every day and she also plays with no and other card games with the residents.  She does not feel she is having any side effects with Entecavir. She takes Thorazine no more than once a month and ibuprofen even less often.   Current Medications: Outpatient Encounter Medications as of 09/19/2017  Medication Sig  . acetaminophen (TYLENOL) 650 MG CR tablet Take 650 mg by mouth every 8 (eight) hours as needed for pain.  . chlorproMAZINE (THORAZINE) 50 MG tablet Take 50 mg by mouth as needed. One tablet by mouth PRN agitation, max of 2 tablets, 100 mg in 24 hours  . Cholecalciferol (VITAMIN D) 2000 units CAPS TAKE 1 CAPSULE BY MOUTH EVERY MORNING.  . cloZAPine (CLOZARIL) 100 MG tablet Take 250 mg by mouth at bedtime.  . cloZAPine (CLOZARIL) 100 MG tablet Take 150 mg by mouth every morning.   . entecavir (BARACLUDE) 0.5 MG tablet TAKE (1) TABLET BY MOUTH ONCE DAILY.  Marland Kitchen Fish Oil-Cholecalciferol (FISH OIL + D3) 1000-1000 MG-UNIT CAPS Take 1 po bid  . FLUoxetine (PROZAC) 20 MG capsule TAKE 3 CAPSULES BY MOUTH ONCE  DAILY.  . fluticasone (FLONASE) 50 MCG/ACT nasal spray SPRAY 2 SPRAYS IN RIGHT NOSTRIL ONLY TWICE DAILY.  . IBU 600 MG tablet TAKE 1 TABLET BY MOUTH WITH FOOD 4 TIMES DAILY AS NEEDED FOR ACTIVE MODERATE PAIN UNRELIEVED BY ACETAMINOPHEN. MAX 2 DOSES/24 HR.  Marland Kitchen levonorgestrel (MIRENA) 20 MCG/24HR IUD 1 each by Intrauterine route once.  . Multiple Vitamin (MULTIVITAMIN WITH MINERALS) TABS tablet Take 1 tablet by mouth daily.  . Skin Protectants, Misc. (MINERIN) CREA APPLY MODERATE AMOUNT TOPICALLY TO DRY SKIN ON SOLES & HEELS OF BOTH FEET AFTER SHOWER. (NURSING STAFF TO SUPERVISE APPLICATION)  . VENTOLIN HFA 108 (90 Base) MCG/ACT inhaler INHALE 2 PUFFS EVERY 4 HOURS AS NEEDED FOR WHEEZING.   No facility-administered encounter medications on file as of 09/19/2017.      Objective: Blood pressure 100/68, pulse 72, temperature 98.7 F (37.1 C), temperature source Oral, resp. rate 18, height 5' 3"  (1.6 m), weight 130 lb (59 kg). Patient is alert and in no acute distress. Conjunctiva is pink. Sclera is nonicteric Oropharyngeal mucosa is normal. No neck masses or thyromegaly noted. Cardiac exam with regular rhythm normal S1 and S2. No murmur or gallop noted. Lungs are clear to auscultation. Abdomenis flat soft and nontender without organomegaly or masses. No LE edema or clubbing noted.  Labs/studies Results: Lab data from 09/06/2017 WBC 8.7, H&H 12.9 and 39.6 and platelet count 262K Glucose 98, BUN 9, creatinine 0.78 Serum sodium  143, potassium 3.9, chloride 105, CO2 27 Serum calcium 9.0. Bilirubin 0.2, AP 79, AST 20, ALT 14, total protein 6.3 and albumin 4.3.   Assessment:  #1.  Chronic hepatitis B stage II/II based on elastography.  Liver biopsy showed sinusoidal dilation and perivenular fibrosis.  She is in 2 years 3 of antiviral therapy with entecavir which is nucleoside reverse transcriptase inhibitor.  Has tolerated this medication very well.  Viral titer is undetectable with lower limit  of detection at 10 IU/mL he however has not seroconverted.  Continue therapy for at least 5 years or longer unless she seroconvert's.   Plan:  Continue entecavir at current dose of 0.5 mg daily. Patient will go to the lab for HPV DNA titer as well as hepatitis B surface antigen. Office visit in 6 months.

## 2017-09-19 NOTE — Patient Instructions (Signed)
Physician will call with results of blood test when completed.

## 2017-10-10 DIAGNOSIS — F250 Schizoaffective disorder, bipolar type: Secondary | ICD-10-CM | POA: Diagnosis not present

## 2017-11-22 ENCOUNTER — Other Ambulatory Visit (HOSPITAL_COMMUNITY)
Admission: RE | Admit: 2017-11-22 | Discharge: 2017-11-22 | Disposition: A | Discharge status: Home / Self Care | Payer: Medicare Other | Source: Ambulatory Visit | Attending: Internal Medicine | Admitting: Internal Medicine

## 2017-11-22 DIAGNOSIS — B182 Chronic viral hepatitis C: Secondary | ICD-10-CM | POA: Insufficient documentation

## 2017-11-22 DIAGNOSIS — K740 Hepatic fibrosis: Secondary | ICD-10-CM | POA: Insufficient documentation

## 2017-11-24 DIAGNOSIS — Z79899 Other long term (current) drug therapy: Secondary | ICD-10-CM | POA: Diagnosis not present

## 2017-11-29 ENCOUNTER — Other Ambulatory Visit: Payer: Self-pay | Admitting: Family Medicine

## 2017-12-01 ENCOUNTER — Other Ambulatory Visit: Payer: Self-pay | Admitting: Family Medicine

## 2017-12-05 MED ORDER — FISH OIL + D3 1200-1000 MG-UNIT PO CAPS: 1.0000 | ORAL_CAPSULE | Freq: Two times a day (BID) | ORAL | 11 refills | Status: DC

## 2017-12-05 NOTE — Telephone Encounter (Signed)
Changed Fish Oil + Vit D3 from 1000 - 1000 to 1200 - 1000 d/t signed order in 03/2017 was this strength as well as this is the strength that [facility] has from the manufacturer.

## 2017-12-05 NOTE — Addendum Note (Signed)
Addended by: Antonietta Barcelona D on: 12/05/2017 10:30 AM   Modules accepted: Orders

## 2017-12-29 ENCOUNTER — Other Ambulatory Visit: Payer: Self-pay | Admitting: Family Medicine

## 2018-01-09 DIAGNOSIS — F250 Schizoaffective disorder, bipolar type: Secondary | ICD-10-CM | POA: Diagnosis not present

## 2018-03-14 ENCOUNTER — Encounter: Payer: Self-pay | Admitting: Family Medicine

## 2018-03-14 ENCOUNTER — Ambulatory Visit (INDEPENDENT_AMBULATORY_CARE_PROVIDER_SITE_OTHER): Payer: Medicare Other | Admitting: Family Medicine

## 2018-03-14 VITALS — BP 100/75 | HR 81 | Temp 97.1°F | Ht 63.0 in | Wt 138.2 lb

## 2018-03-14 DIAGNOSIS — E782 Mixed hyperlipidemia: Secondary | ICD-10-CM

## 2018-03-14 DIAGNOSIS — Z23 Encounter for immunization: Secondary | ICD-10-CM | POA: Diagnosis not present

## 2018-03-14 DIAGNOSIS — R350 Frequency of micturition: Secondary | ICD-10-CM | POA: Diagnosis not present

## 2018-03-14 LAB — LIPID PANEL
CHOLESTEROL TOTAL: 140 mg/dL (ref 100–199)
Chol/HDL Ratio: 2.5 ratio (ref 0.0–4.4)
HDL: 57 mg/dL (ref >39)
LDL Calculated: 72 mg/dL (ref 0–99)
TRIGLYCERIDES: 56 mg/dL (ref 0–149)
VLDL Cholesterol Cal: 11 mg/dL (ref 5–40)

## 2018-03-14 LAB — CMP14+EGFR
ALBUMIN: 3.8 g/dL (ref 3.5–5.5)
ALT: 13 IU/L (ref 0–32)
AST: 17 IU/L (ref 0–40)
Albumin/Globulin Ratio: 1.7 (ref 1.2–2.2)
Alkaline Phosphatase: 88 IU/L (ref 39–117)
BILIRUBIN TOTAL: 0.3 mg/dL (ref 0.0–1.2)
BUN / CREAT RATIO: 9 (ref 9–23)
BUN: 7 mg/dL (ref 6–24)
CHLORIDE: 105 mmol/L (ref 96–106)
CO2: 26 mmol/L (ref 20–29)
CREATININE: 0.74 mg/dL (ref 0.57–1.00)
Calcium: 9.1 mg/dL (ref 8.7–10.2)
GFR calc Af Amer: 112 mL/min/{1.73_m2} (ref >59)
GFR, EST NON AFRICAN AMERICAN: 97 mL/min/{1.73_m2} (ref >59)
GLOBULIN, TOTAL: 2.2 g/dL (ref 1.5–4.5)
GLUCOSE: 86 mg/dL (ref 65–99)
POTASSIUM: 4.4 mmol/L (ref 3.5–5.2)
Sodium: 144 mmol/L (ref 134–144)
Total Protein: 6 g/dL (ref 6.0–8.5)

## 2018-03-14 LAB — CBC WITH DIFFERENTIAL/PLATELET
Basophils Absolute: 0.1 10*3/uL (ref 0.0–0.2)
Basos: 1 %
EOS (ABSOLUTE): 0.2 10*3/uL (ref 0.0–0.4)
Eos: 2 %
Hematocrit: 36.3 % (ref 34.0–46.6)
Hemoglobin: 12.2 g/dL (ref 11.1–15.9)
IMMATURE GRANULOCYTES: 0 %
Immature Grans (Abs): 0 10*3/uL (ref 0.0–0.1)
LYMPHS: 25 %
Lymphocytes Absolute: 2.1 10*3/uL (ref 0.7–3.1)
MCH: 30.3 pg (ref 26.6–33.0)
MCHC: 33.6 g/dL (ref 31.5–35.7)
MCV: 90 fL (ref 79–97)
MONOCYTES: 5 %
MONOS ABS: 0.4 10*3/uL (ref 0.1–0.9)
NEUTROS PCT: 67 %
Neutrophils Absolute: 5.5 10*3/uL (ref 1.4–7.0)
Platelets: 274 10*3/uL (ref 150–450)
RBC: 4.02 x10E6/uL (ref 3.77–5.28)
RDW: 12.2 % — ABNORMAL LOW (ref 12.3–15.4)
WBC: 8.2 10*3/uL (ref 3.4–10.8)

## 2018-03-14 NOTE — Progress Notes (Signed)
BP 100/75   Pulse 81   Temp (!) 97.1 F (36.2 C) (Oral)   Ht 5' 3"  (1.6 m)   Wt 138 lb 3.2 oz (62.7 kg)   BMI 24.48 kg/m    Subjective:    [name], female    DOB: 02/22/1971, 47 y.o.   MRN: [id number]  HPI: Margaret [name] is a 46 y.o. female presenting on 03/14/2018 for Medical Management of Chronic Issues (six month recheck)   HPI Hyperlipidemia Patient is coming in for recheck of his hyperlipidemia. The patient is currently taking fish oil. They deny any issues with myalgias or history of liver damage from it. They deny any focal numbness or weakness or chest pain.   Urinary frequency issues. Patient is coming in for recheck of urinary frequency issues and per her and her staff member here today they says she has not been having as many issues with this and has been doing very well with that.  They have been very satisfied with her recently for this.  A lot of the issues initially were related to psychiatric complaints and her psychiatric illness and we will continue to monitor  Relevant past medical, surgical, family and social history reviewed and updated as indicated. Interim medical history since our last visit reviewed. Allergies and medications reviewed and updated.  Review of Systems  Constitutional: Negative for chills and fever.  HENT: Negative for congestion, ear discharge and ear pain.   Eyes: Negative for redness and visual disturbance.  Respiratory: Negative for chest tightness and shortness of breath.   Cardiovascular: Negative for chest pain and leg swelling.  Gastrointestinal: Negative for abdominal pain.  Genitourinary: Negative for difficulty urinating, dysuria, frequency and urgency.  Musculoskeletal: Negative for back pain and gait problem.  Skin: Negative for rash.  Neurological: Negative for light-headedness and headaches.  Psychiatric/Behavioral: Negative for agitation and behavioral problems.  All other systems reviewed and are  negative.   Per HPI unless specifically indicated above   Allergies as of 03/14/2018   No Known Allergies     Medication List        Accurate as of 03/14/18  9:44 AM. Always use your most recent med list.          acetaminophen 650 MG CR tablet Commonly known as:  TYLENOL Take 650 mg by mouth every 8 (eight) hours as needed for pain.   chlorproMAZINE 50 MG tablet Commonly known as:  THORAZINE Take 50 mg by mouth as needed. One tablet by mouth PRN agitation, max of 2 tablets, 100 mg in 24 hours   cloZAPine 100 MG tablet Commonly known as:  CLOZARIL Take 250 mg by mouth at bedtime.   cloZAPine 100 MG tablet Commonly known as:  CLOZARIL Take 150 mg by mouth every morning.   entecavir 0.5 MG tablet Commonly known as:  BARACLUDE TAKE (1) TABLET BY MOUTH ONCE DAILY.   FISH OIL + D3 1000-1000 MG-UNIT Caps TAKE 1 CAPSULE BY MOUTH TWICE DAILY.   FISH OIL + D3 1200-1000 MG-UNIT Caps Take 1 capsule by mouth 2 (two) times daily.   FLUoxetine 20 MG capsule Commonly known as:  PROZAC TAKE 3 CAPSULES BY MOUTH ONCE DAILY.   fluticasone 50 MCG/ACT nasal spray Commonly known as:  FLONASE SPRAY 2 SPRAYS IN RIGHT NOSTRIL ONLY TWICE DAILY.   IBU 600 MG tablet Generic drug:  ibuprofen TAKE 1 TABLET BY MOUTH WITH FOOD 4 TIMES DAILY AS NEEDED FOR ACTIVE MODERATE PAIN UNRELIEVED BY ACETAMINOPHEN.  MAX 2 DOSES/24 HR.   levonorgestrel 20 MCG/24HR IUD Commonly known as:  MIRENA 1 each by Intrauterine route once.   MINERIN Crea APPLY MODERATE AMOUNT TOPICALLY TO DRY SKIN ON SOLES & HEELS OF BOTH FEET AFTER SHOWER. (NURSING STAFF TO SUPERVISE APPLICATION)   multivitamin with minerals Tabs tablet Take 1 tablet by mouth daily.   SENTRY Tabs TAKE 1 TABLET BY MOUTH AT BEDTIME.   VENTOLIN HFA 108 (90 Base) MCG/ACT inhaler Generic drug:  albuterol INHALE 2 PUFFS EVERY 4 HOURS AS NEEDED FOR WHEEZING.   Vitamin D3 50 MCG (2000 UT) capsule TAKE 1 CAPSULE BY MOUTH EVERY MORNING.           Objective:    BP 100/75   Pulse 81   Temp (!) 97.1 F (36.2 C) (Oral)   Ht 5' 3"  (1.6 m)   Wt 138 lb 3.2 oz (62.7 kg)   BMI 24.48 kg/m   Wt Readings from Last 3 Encounters:  03/14/18 138 lb 3.2 oz (62.7 kg)  09/19/17 130 lb (59 kg)  09/06/17 129 lb 9.6 oz (58.8 kg)    Physical Exam  Constitutional: She is oriented to person, place, and time. She appears well-developed and well-nourished. No distress.  Eyes: Pupils are equal, round, and reactive to light. Conjunctivae and EOM are normal.  Neck: Neck supple. No thyromegaly present.  Cardiovascular: Normal rate, regular rhythm, normal heart sounds and intact distal pulses.  No murmur heard. Pulmonary/Chest: Effort normal and breath sounds normal. No respiratory distress. She has no wheezes.  Abdominal: Soft. Bowel sounds are normal. She exhibits no distension and no mass. There is no tenderness. There is no guarding.  Musculoskeletal: Normal range of motion. She exhibits no edema.  Lymphadenopathy:    She has no cervical adenopathy.  Neurological: She is alert and oriented to person, place, and time. Coordination normal.  Skin: Skin is warm and dry. No rash noted. She is not diaphoretic.  Psychiatric: She has a normal mood and affect. Her behavior is normal.  Nursing note and vitals reviewed.       Assessment & Plan:   Problem List Items Addressed This Visit      Other   Urinary frequency   Relevant Orders   CBC with Differential/Platelet   CMP14+EGFR   Hyperlipidemia - Primary   Relevant Orders   CMP14+EGFR   Lipid panel    Other Visit Diagnoses    Need for immunization against influenza       Relevant Orders   Flu Vaccine QUAD 36+ mos IM (Completed)      Per patient and staff urinary frequency has been doing well and not bothering her.  Continue cholesterol medication and check labs today Follow up plan: Return in about 6 months (around 09/13/2018), or if symptoms worsen or fail to  improve.  Counseling provided for all of the vaccine components Orders Placed This Encounter  Procedures  . Flu Vaccine QUAD 36+ mos IM  . CBC with Differential/Platelet  . CMP14+EGFR  . Lipid panel    Caryl Pina, MD [facility] 03/14/2018, 9:44 AM

## 2018-03-19 ENCOUNTER — Other Ambulatory Visit (HOSPITAL_COMMUNITY)
Admission: RE | Admit: 2018-03-19 | Discharge: 2018-03-19 | Disposition: A | Discharge status: Home / Self Care | Payer: Medicare Other | Source: Ambulatory Visit | Attending: Internal Medicine | Admitting: Internal Medicine

## 2018-03-19 DIAGNOSIS — B182 Chronic viral hepatitis C: Secondary | ICD-10-CM | POA: Insufficient documentation

## 2018-03-19 DIAGNOSIS — K740 Hepatic fibrosis: Secondary | ICD-10-CM | POA: Diagnosis not present

## 2018-03-20 ENCOUNTER — Encounter (INDEPENDENT_AMBULATORY_CARE_PROVIDER_SITE_OTHER): Payer: Self-pay | Admitting: Internal Medicine

## 2018-03-20 ENCOUNTER — Ambulatory Visit (INDEPENDENT_AMBULATORY_CARE_PROVIDER_SITE_OTHER): Payer: Medicare Other | Admitting: Internal Medicine

## 2018-03-20 VITALS — BP 100/66 | HR 72 | Temp 98.3°F | Resp 18 | Ht 63.0 in | Wt 134.5 lb

## 2018-03-20 DIAGNOSIS — B181 Chronic viral hepatitis B without delta-agent: Secondary | ICD-10-CM

## 2018-03-20 LAB — HEPATITIS B DNA, ULTRAQUANTITATIVE, PCR
HBV DNA SERPL PCR-ACNC: NOT DETECTED IU/mL
HBV DNA SERPL PCR-LOG IU: UNDETERMINED {Log_IU}/mL

## 2018-03-20 LAB — HEPATITIS B SURFACE ANTIGEN

## 2018-03-20 LAB — HEPATITIS B SURFACE AG, CONFIRM: HBsAg Confirmation: POSITIVE — AB

## 2018-03-20 NOTE — Patient Instructions (Signed)
Physician will call with results of blood test when completed.

## 2018-03-20 NOTE — Progress Notes (Signed)
Presenting complaint;  Chronic hepatitis B.  Database and subjective:  Patient is 47 year old female who has history of chronic hepatitis B and she had liver biopsy back in December 2016 revealing There is patchy, mild sinusoidal dilatation, predominantly central lobular with some areas showing mild periportal dilatation, patchy increased staining around occasional central veins on the reticulin stain but not with trichrome stain. No increased fibrosis is identified with Trichrome stain, and no increased iron is identified with iron stain. She was begun on anticoagulant in January 2017. She responded and HBV DNA became undetectable.  However she has not cleared virus.  She was last seen 6 months ago. She is accompanied by Ms. [place] Wilson who manages group home.   Patient has no complaints.  She has good appetite.  She denies abdominal pain or pruritus.  She has gained 4 pounds since her last visit.  Weight gain may be because she is not walking as much.  She goes to day program from 8 AM to 3 PM 5 days each week.  She rarely takes acetaminophen Thorazine or ibuprofen. Ms. Redmond Pulling states that she does not have access to computer that she is known to throw and damaged things.  Patient says she loves to listen to music.   Current Medications: Outpatient Encounter Medications as of 03/20/2018  Medication Sig  . chlorproMAZINE (THORAZINE) 50 MG tablet Take 50 mg by mouth as needed. One tablet by mouth PRN agitation, max of 2 tablets, 100 mg in 24 hours  . Cholecalciferol (VITAMIN D3) 2000 units capsule TAKE 1 CAPSULE BY MOUTH EVERY MORNING.  . cloZAPine (CLOZARIL) 100 MG tablet Take 250 mg by mouth at bedtime.  . cloZAPine (CLOZARIL) 100 MG tablet Take 150 mg by mouth every morning.   . entecavir (BARACLUDE) 0.5 MG tablet TAKE (1) TABLET BY MOUTH ONCE DAILY.  Marland Kitchen Fish Oil-Cholecalciferol (FISH OIL + D3) 1000-1000 MG-UNIT CAPS TAKE 1 CAPSULE BY MOUTH TWICE DAILY.  Marland Kitchen FLUoxetine (PROZAC) 20 MG  capsule TAKE 3 CAPSULES BY MOUTH ONCE DAILY.  . fluticasone (FLONASE) 50 MCG/ACT nasal spray SPRAY 2 SPRAYS IN RIGHT NOSTRIL ONLY TWICE DAILY.  . IBU 600 MG tablet TAKE 1 TABLET BY MOUTH WITH FOOD 4 TIMES DAILY AS NEEDED FOR ACTIVE MODERATE PAIN UNRELIEVED BY ACETAMINOPHEN. MAX 2 DOSES/24 HR.  Marland Kitchen levonorgestrel (MIRENA) 20 MCG/24HR IUD 1 each by Intrauterine route once.  . Multiple Vitamin (MULTIVITAMIN WITH MINERALS) TABS tablet Take 1 tablet by mouth daily.  . Multiple Vitamins-Minerals (SENTRY) TABS TAKE 1 TABLET BY MOUTH AT BEDTIME.  . Skin Protectants, Misc. (MINERIN) CREA APPLY MODERATE AMOUNT TOPICALLY TO DRY SKIN ON SOLES & HEELS OF BOTH FEET AFTER SHOWER. (NURSING STAFF TO SUPERVISE APPLICATION)  . VENTOLIN HFA 108 (90 Base) MCG/ACT inhaler INHALE 2 PUFFS EVERY 4 HOURS AS NEEDED FOR WHEEZING.  Marland Kitchen acetaminophen (TYLENOL) 650 MG CR tablet Take 650 mg by mouth every 8 (eight) hours as needed for pain.  . [DISCONTINUED] Fish Oil-Cholecalciferol (FISH OIL + D3) 1200-1000 MG-UNIT CAPS Take 1 capsule by mouth 2 (two) times daily. (Patient not taking: Reported on 03/20/2018)   No facility-administered encounter medications on file as of 03/20/2018.      Objective: Blood pressure 100/66, pulse 72, temperature 98.3 F (36.8 C), temperature source Oral, resp. rate 18, height 5' 3"  (1.6 m), weight 134 lb 8 oz (61 kg). Patient is alert and in no acute distress. Conjunctiva is pink. Sclera is nonicteric Oropharyngeal mucosa is normal. No neck masses or thyromegaly noted. Cardiac  exam with regular rhythm normal S1 and S2. No murmur or gallop noted. Lungs are clear to auscultation. Abdomen is symmetrical soft and nontender with organomegaly or masses. No LE edema or clubbing noted.  Labs/studies Results:  CBC Latest Ref Rng & Units 03/14/2018 09/06/2017 04/19/2017  WBC 3.4 - 10.8 x10E3/uL 8.2 8.7 10.5  Hemoglobin 11.1 - 15.9 g/dL 12.2 12.9 13.3  Hematocrit 34.0 - 46.6 % 36.3 39.6 41.9  Platelets  150 - 450 x10E3/uL 274 262 242    CMP Latest Ref Rng & Units 03/14/2018 09/06/2017 04/19/2017  Glucose 65 - 99 mg/dL 86 98 -  BUN 6 - 24 mg/dL 7 9 -  Creatinine 0.57 - 1.00 mg/dL 0.74 0.71 -  Sodium 134 - 144 mmol/L 144 143 -  Potassium 3.5 - 5.2 mmol/L 4.4 3.9 -  Chloride 96 - 106 mmol/L 105 105 -  CO2 20 - 29 mmol/L 26 27 -  Calcium 8.7 - 10.2 mg/dL 9.1 9.0 -  Total Protein 6.0 - 8.5 g/dL 6.0 6.3 6.8  Total Bilirubin 0.0 - 1.2 mg/dL 0.3 0.2 0.5  Alkaline Phos 39 - 117 IU/L 88 79 72  AST 0 - 40 IU/L 17 20 14(L)  ALT 0 - 32 IU/L 13 14 13(L)    Hepatic Function Latest Ref Rng & Units 03/14/2018 09/06/2017 04/19/2017  Total Protein 6.0 - 8.5 g/dL 6.0 6.3 6.8  Albumin 3.5 - 5.5 g/dL 3.8 4.3 3.9  AST 0 - 40 IU/L 17 20 14(L)  ALT 0 - 32 IU/L 13 14 13(L)  Alk Phosphatase 39 - 117 IU/L 88 79 72  Total Bilirubin 0.0 - 1.2 mg/dL 0.3 0.2 0.5  Bilirubin, Direct 0.1 - 0.5 mg/dL - - 0.1      Assessment:  #1.  Chronic hepatitis B.  She has some peri-sono Sital fibrosis on liver biopsy of December 2016.  She would be completing year 3 of entecavir next months.  She has responded so that HBV DNA is undetectable.  However on last testing she still was positive for hepatitis B surface antigen which means she has not seroconverted and this may never happen.  She will continue therapy for at least another 2 years as long as she does not become resistant to this medication. Transaminases are normal and she has no stigmata of progressive liver disease or portal hypertension.   Plan:  Patient will continue entecavir at current dose of 0.5 mg daily. I will be contacting Ms. [place] Wilson with results of hepatitis B surface antigen and HBV DNA titer in near future. Office visit in 6 months.

## 2018-03-22 ENCOUNTER — Other Ambulatory Visit: Payer: Self-pay | Admitting: Family Medicine

## 2018-03-30 ENCOUNTER — Other Ambulatory Visit: Payer: Self-pay | Admitting: Family Medicine

## 2018-04-26 ENCOUNTER — Encounter: Payer: Self-pay | Admitting: *Deleted

## 2018-04-26 ENCOUNTER — Ambulatory Visit (INDEPENDENT_AMBULATORY_CARE_PROVIDER_SITE_OTHER): Payer: Medicare Other | Admitting: *Deleted

## 2018-04-26 VITALS — BP 107/69 | HR 95 | Ht 63.0 in | Wt 135.0 lb

## 2018-04-26 DIAGNOSIS — Z0000 Encounter for general adult medical examination without abnormal findings: Secondary | ICD-10-CM

## 2018-04-26 NOTE — Patient Instructions (Addendum)
Goals    . Patient Stated (pt-stated)     Increase social interaction with friends at the group home.      Please review the information given on Advance Directives.  If you complete the paperwork, please bring a copy to our office to be filed in your medical record.   Please follow up with Dr. Warrick Parisian.   Thank you for coming in for your Annual Wellness Visit today!!   Preventive Care 40-64 Years, Female Preventive care refers to lifestyle choices and visits with your health care provider that can promote health and wellness. What does preventive care include?   A yearly physical exam. This is also called an annual well check.  Dental exams once or twice a year.  Routine eye exams. Ask your health care provider how often you should have your eyes checked.  Personal lifestyle choices, including: ? Daily care of your teeth and gums. ? Regular physical activity. ? Eating a healthy diet. ? Avoiding tobacco and drug use. ? Limiting alcohol use. ? Practicing safe sex. ? Taking low-dose aspirin daily starting at age 30. ? Taking vitamin and mineral supplements as recommended by your health care provider. What happens during an annual well check? The services and screenings done by your health care provider during your annual well check will depend on your age, overall health, lifestyle risk factors, and family history of disease. Counseling Your health care provider may ask you questions about your:  Alcohol use.  Tobacco use.  Drug use.  Emotional well-being.  Home and relationship well-being.  Sexual activity.  Eating habits.  Work and work Statistician.  Method of birth control.  Menstrual cycle.  Pregnancy history. Screening You may have the following tests or measurements:  Height, weight, and BMI.  Blood pressure.  Lipid and cholesterol levels. These may be checked every 5 years, or more frequently if you are over 39 years old.  Skin check.  Lung  cancer screening. You may have this screening every year starting at age 24 if you have a 30-pack-year history of smoking and currently smoke or have quit within the past 15 years.  Colorectal cancer screening. All adults should have this screening starting at age 21 and continuing until age 39. Your health care provider may recommend screening at age 31. You will have tests every 1-10 years, depending on your results and the type of screening test. People at increased risk should start screening at an earlier age. Screening tests may include: ? Guaiac-based fecal occult blood testing. ? Fecal immunochemical test (FIT). ? Stool DNA test. ? Virtual colonoscopy. ? Sigmoidoscopy. During this test, a flexible tube with a tiny camera (sigmoidoscope) is used to examine your rectum and lower colon. The sigmoidoscope is inserted through your anus into your rectum and lower colon. ? Colonoscopy. During this test, a long, thin, flexible tube with a tiny camera (colonoscope) is used to examine your entire colon and rectum.  Hepatitis C blood test.  Hepatitis B blood test.  Sexually transmitted disease (STD) testing.  Diabetes screening. This is done by checking your blood sugar (glucose) after you have not eaten for a while (fasting). You may have this done every 1-3 years.  Mammogram. This may be done every 1-2 years. Talk to your health care provider about when you should start having regular mammograms. This may depend on whether you have a family history of breast cancer.  BRCA-related cancer screening. This may be done if you have a family history  of breast, ovarian, tubal, or peritoneal cancers.  Pelvic exam and Pap test. This may be done every 3 years starting at age 25. Starting at age 60, this may be done every 5 years if you have a Pap test in combination with an HPV test.  Bone density scan. This is done to screen for osteoporosis. You may have this scan if you are at high risk for  osteoporosis. Discuss your test results, treatment options, and if necessary, the need for more tests with your health care provider. Vaccines Your health care provider may recommend certain vaccines, such as:  Influenza vaccine. This is recommended every year.  Tetanus, diphtheria, and acellular pertussis (Tdap, Td) vaccine. You may need a Td booster every 10 years.  Varicella vaccine. You may need this if you have not been vaccinated.  Zoster vaccine. You may need this after age 68.  Measles, mumps, and rubella (MMR) vaccine. You may need at least one dose of MMR if you were born in 1957 or later. You may also need a second dose.  Pneumococcal 13-valent conjugate (PCV13) vaccine. You may need this if you have certain conditions and were not previously vaccinated.  Pneumococcal polysaccharide (PPSV23) vaccine. You may need one or two doses if you smoke cigarettes or if you have certain conditions.  Meningococcal vaccine. You may need this if you have certain conditions.  Hepatitis A vaccine. You may need this if you have certain conditions or if you travel or work in places where you may be exposed to hepatitis A.  Hepatitis B vaccine. You may need this if you have certain conditions or if you travel or work in places where you may be exposed to hepatitis B.  Haemophilus influenzae type b (Hib) vaccine. You may need this if you have certain conditions. Talk to your health care provider about which screenings and vaccines you need and how often you need them. This information is not intended to replace advice given to you by your health care provider. Make sure you discuss any questions you have with your health care provider. Document Released: 04/17/2015 Document Revised: 05/11/2017 Document Reviewed: 01/20/2015 Elsevier Interactive Patient Education  2019 [employer].

## 2018-04-26 NOTE — Progress Notes (Signed)
Subjective:   Margaret [name] is a 48 y.o. female who presents for Medicare Annual (Subsequent) preventive examination.  Margaret [name] is accompanied today by a caregiver from the group home where she lives- [facility].  She states she enjoys going shopping and out to eat at restaurants.  She goes to a day program Monday- Friday where she does exercises, crafts, and other wellness activities.  She also participates in [employer] [place], and her parents come to visit her and take her shopping or out to restaurants.  Margaret [name] feels her health has improved over the last year as her lab results have been better.  She reports no ER visits, hospitalizations, or surgeries in the past year.   Review of Systems:   All systems negative today  Cardiac Risk Factors include: dyslipidemia     Objective:     Vitals: BP 107/69   Pulse 95   Ht 5' 3"  (1.6 m)   Wt 135 lb (61.2 kg)   BMI 23.91 kg/m   Body mass index is 23.91 kg/m.  Advanced Directives 04/26/2018 05/09/2016 03/24/2015  Does Patient Have a Medical Advance Directive? No (No Data) No  Would patient like information on creating a medical advance directive? Yes (MAU/Ambulatory/Procedural Areas - Information given) (No Data) No - patient declined information    Tobacco Social History   Tobacco Use  Smoking Status Former Smoker  . Packs/day: 1.00  . Years: 20.00  . Pack years: 20.00  . Types: Cigarettes  . Last attempt to quit: 11/03/2010  . Years since quitting: 7.4  Smokeless Tobacco Never Used       Clinical Intake:     Pain Score: 0-No pain                 Past Medical History:  Diagnosis Date  . Hematuria 11/12/2014  . Hepatitis B carrier ([place])   . Hypothyroidism   . Impulse disorder   . IUD (intrauterine device) in place 11/12/2014   Inserted 12/24/12 at [facility]  . Mild intellectual disability   . Schizophrenia ([place])   . Schizophrenia ([place])   . Tuberculosis    as a child  . Urinary frequency 11/12/2014    Past Surgical History:  Procedure Laterality Date  . ARM WOUND REPAIR / CLOSURE    . TRACHEAL SURGERY    . TRACHEOSTOMY     Family History  Adopted: Yes  Family history unknown: Yes   Social History   Socioeconomic History  . Marital status: Single    Spouse name: Not on file  . Number of children: 0  . Years of education: 62  . Highest education level: High school graduate  Occupational History  . Occupation: disabled    Comment: due to schizophrenia  Social Needs  . Financial resource strain: Not hard at all  . Food insecurity:    Worry: Never true    Inability: Never true  . Transportation needs:    Medical: No    Non-medical: No  Tobacco Use  . Smoking status: Former Smoker    Packs/day: 1.00    Years: 20.00    Pack years: 20.00    Types: Cigarettes    Last attempt to quit: 11/03/2010    Years since quitting: 7.4  . Smokeless tobacco: Never Used  Substance and Sexual Activity  . Alcohol use: No    Alcohol/week: 0.0 standard drinks  . Drug use: No  . Sexual activity: Never    Birth control/protection:  I.U.D.  Lifestyle  . Physical activity:    Days per week: 7 days    Minutes per session: 90 min  . Stress: Not at all  Relationships  . Social connections:    Talks on phone: More than three times a week    Gets together: More than three times a week    Attends religious service: Never    Active member of club or organization: Yes    Attends meetings of clubs or organizations: More than 4 times per year    Relationship status: Never married  Other Topics Concern  . Not on file  Social History Narrative   Patient is adopted. She does not have any children and has never been married. She lives in a group home and is disabled. Has a diagnosis of schizophrenia.     Outpatient Encounter Medications as of 04/26/2018  Medication Sig  . acetaminophen (TYLENOL) 650 MG CR tablet Take 650 mg by mouth every 8 (eight) hours as needed for pain.  . chlorproMAZINE  (THORAZINE) 50 MG tablet Take 50 mg by mouth as needed. One tablet by mouth PRN agitation, max of 2 tablets, 100 mg in 24 hours  . Cholecalciferol (VITAMIN D3) 2000 units capsule TAKE 1 CAPSULE BY MOUTH EVERY MORNING.  . cloZAPine (CLOZARIL) 100 MG tablet Take 250 mg by mouth at bedtime.  . cloZAPine (CLOZARIL) 100 MG tablet Take 150 mg by mouth every morning.   . entecavir (BARACLUDE) 0.5 MG tablet TAKE (1) TABLET BY MOUTH ONCE DAILY.  Marland Kitchen Fish Oil-Cholecalciferol (FISH OIL + D3) 1000-1000 MG-UNIT CAPS TAKE 1 CAPSULE BY MOUTH TWICE DAILY.  Marland Kitchen FLUoxetine (PROZAC) 20 MG capsule TAKE 3 CAPSULES BY MOUTH ONCE DAILY.  . fluticasone (FLONASE) 50 MCG/ACT nasal spray SPRAY 2 SPRAYS IN RIGHT NOSTRIL ONLY TWICE DAILY.  . IBU 600 MG tablet TAKE 1 TABLET BY MOUTH WITH FOOD 4 TIMES DAILY AS NEEDED FOR ACTIVE MODERATE PAIN UNRELIEVED BY ACETAMINOPHEN. MAX 2 DOSES/24 HR.  Marland Kitchen levonorgestrel (MIRENA) 20 MCG/24HR IUD 1 each by Intrauterine route once.  . Multiple Vitamin (MULTIVITAMIN WITH MINERALS) TABS tablet Take 1 tablet by mouth daily.  . Multiple Vitamins-Minerals (SENTRY) TABS TAKE 1 TABLET BY MOUTH AT BEDTIME.  . Skin Protectants, Misc. (MINERIN) CREA APPLY MODERATE AMOUNT TOPICALLY TO DRY SKIN ON SOLES & HEELS OF BOTH FEET AFTER SHOWER. (NURSING STAFF TO SUPERVISE APPLICATION)  . VENTOLIN HFA 108 (90 Base) MCG/ACT inhaler INHALE 2 PUFFS EVERY 4 HOURS AS NEEDED FOR WHEEZING.   No facility-administered encounter medications on file as of 04/26/2018.     Activities of Daily Living In your present state of health, do you have any difficulty performing the following activities: 04/26/2018 04/26/2018  Hearing? N -  Vision? N -  Difficulty concentrating or making decisions? N -  Walking or climbing stairs? N -  Dressing or bathing? N -  Doing errands, shopping? Y (No Data)  Comment - Group home employees provide transportation for patient  Preparing Food and eating ? N -  Using the Toilet? N -  In the past  six months, have you accidently leaked urine? N -  Do you have problems with loss of bowel control? N -  Managing your Medications? N -  Managing your Finances? N -  Housekeeping or managing your Housekeeping? N -  Some recent data might be hidden    Patient Care Team: Dettinger, Fransisca Kaufmann, MD as PCP - General (Family Medicine) Rogene Houston, MD as Consulting Physician (  Gastroenterology)    Assessment:   This is a routine wellness examination for Tihanna.  Exercise Activities and Dietary recommendations  Ms. Vora lives at [facility], and all her meals are prepared for her there by staff.  She states she eats 3 meals per day and occasionally a snack.  She states she has access to all the food she needs.  Encouraged patients to choose non starchy vegetables, fruits, lean proteins, and whole grains when possible.    Current Exercise Habits: Home exercise routine, Type of exercise: walking, Time (Minutes): > 60, Frequency (Times/Week): 6, Weekly Exercise (Minutes/Week): 0, Intensity: Moderate Caregiver states patient uses walking as a way to decrease agitation.  States she walks 1-2 hours 5-6 days per week.   Goals    . Patient Stated (pt-stated)     Increase social interaction with friends at the group home.        Fall Risk Fall Risk  04/26/2018 09/06/2017 04/20/2017 03/23/2016 11/17/2014  Falls in the past year? 0 No No No No   Is the patient's home free of loose throw rugs in walkways, pet beds, electrical cords, etc?   yes      Grab bars in the bathroom? yes      Handrails on the stairs?   no stairs in home      Adequate lighting?   yes    Depression Screen PHQ 2/9 Scores 04/26/2018 03/14/2018 09/06/2017 04/20/2017  PHQ - 2 Score 4 2 2  -  PHQ- 9 Score 11 5 6  -  Exception Documentation - - - Other- indicate reason in comment box  Not completed - - - Difficult to screen due to schizophrenia  Caregiver assisted with screening     Cognitive Function     6CIT Screen  04/26/2018  What Year? 0 points  What month? 0 points  What time? 0 points  Count back from 20 0 points  Months in reverse 2 points  Repeat phrase 10 points  Total Score 12    Immunization History  Administered Date(s) Administered  . Influenza,inj,Quad PF,6+ Mos 12/30/2015, 12/30/2016, 03/14/2018  . Influenza-Unspecified 01/12/2015    Qualifies for Shingles Vaccine? No   Screening Tests Health Maintenance  Topic Date Due  . PAP SMEAR-Modifier  02/22/2018  . MAMMOGRAM  05/11/2018 (Originally 03/13/1990)  . TETANUS/TDAP  06/30/2026  . INFLUENZA VACCINE  Completed  . HIV Screening  Completed   Patient's caregiver plans to check schedule an appointment for patient to get pap smear and possibly have IUD replaced if it is time.  Mammogram scheduled for 05/15/2018   Cancer Screenings: Lung: Low Dose CT Chest recommended if Age 41-80 years, 30 pack-year currently smoking OR have quit w/in 15years. Patient does not qualify. Breast:  Up to date on Mammogram? No, scheduled for 05/15/2018 Up to date of Bone Density/Dexa? Not indicated  Colorectal: Not indicated   Additional Screenings:  Hepatitis C Screening:  Completed 11/10/14     Plan:     Work on your goal of increasing social interaction with other residents at the group home.  Review the information given on Advance Directives.  If you complete the paperwork, please bring a copy to our office to be filed in your medical record.  Follow up with Dr. Warrick Parisian.   I have personally reviewed and noted the following in the patient's chart:   . Medical and social history . Use of alcohol, tobacco or illicit drugs  . Current medications and supplements . Functional  ability and status . Nutritional status . Physical activity . Advanced directives . List of other physicians . Hospitalizations, surgeries, and ER visits in previous 12 months . Vitals . Screenings to include cognitive, depression, and falls . Referrals and  appointments  In addition, I have reviewed and discussed with patient certain preventive protocols, quality metrics, and best practice recommendations. A written personalized care plan for preventive services as well as general preventive health recommendations were provided to patient.     Denyce Robert, RN  04/26/2018

## 2018-05-15 DIAGNOSIS — Z1231 Encounter for screening mammogram for malignant neoplasm of breast: Secondary | ICD-10-CM | POA: Diagnosis not present

## 2018-05-15 LAB — HM MAMMOGRAPHY

## 2018-05-16 ENCOUNTER — Telehealth: Payer: Self-pay | Admitting: Family Medicine

## 2018-05-16 NOTE — Telephone Encounter (Signed)
Apt made 2/20. Please order mirena

## 2018-05-16 NOTE — Telephone Encounter (Signed)
Yes go ahead and schedule for IUD insertion and removal

## 2018-05-17 NOTE — Telephone Encounter (Signed)
Ordered

## 2018-05-22 DIAGNOSIS — F250 Schizoaffective disorder, bipolar type: Secondary | ICD-10-CM | POA: Diagnosis not present

## 2018-05-23 ENCOUNTER — Encounter: Payer: Self-pay | Admitting: Family Medicine

## 2018-05-23 ENCOUNTER — Ambulatory Visit (INDEPENDENT_AMBULATORY_CARE_PROVIDER_SITE_OTHER): Payer: Medicare Other | Admitting: Family Medicine

## 2018-05-23 VITALS — BP 104/71 | HR 98 | Temp 97.6°F | Ht 63.0 in | Wt 140.0 lb

## 2018-05-23 DIAGNOSIS — Z01419 Encounter for gynecological examination (general) (routine) without abnormal findings: Secondary | ICD-10-CM | POA: Diagnosis not present

## 2018-05-23 DIAGNOSIS — Z30433 Encounter for removal and reinsertion of intrauterine contraceptive device: Secondary | ICD-10-CM

## 2018-05-23 DIAGNOSIS — Z975 Presence of (intrauterine) contraceptive device: Secondary | ICD-10-CM

## 2018-05-23 LAB — PREGNANCY, URINE: PREG TEST UR: NEGATIVE

## 2018-05-23 MED ORDER — LEVONORGESTREL 20 MCG/24HR IU IUD
INTRAUTERINE_SYSTEM | Freq: Once | INTRAUTERINE | Status: AC
  Administered 2018-05-23: 11:00:00 via INTRAUTERINE

## 2018-05-23 NOTE — Progress Notes (Signed)
BP 104/71   Pulse 98   Temp 97.6 F (36.4 C) (Oral)   Ht 5' 3"  (1.6 m)   Wt 140 lb (63.5 kg)   BMI 24.80 kg/m    Subjective:    [name], female    DOB: 04-Jun-1970, 48 y.o.   MRN: [id number]  HPI: Margaret [name] is a 48 y.o. female presenting on 05/23/2018 for Contraception (mirena removal and insertion )   HPI Doing well exam and physical and IUD removal and reinsertion.  Patient is coming in for a physical exam today and checkup and IUD removal and reinsertion.  She has had a Mirena and has had it for almost 6 years now and is overdue for removal and reinsertion today.  She has like the Mirena and has not had any issues with it. Patient denies any chest pain, shortness of breath, headaches or vision issues, abdominal complaints, diarrhea, nausea, vomiting, or joint issues.   Relevant past medical, surgical, family and social history reviewed and updated as indicated. Interim medical history since our last visit reviewed. Allergies and medications reviewed and updated.  Review of Systems  Constitutional: Negative for chills and fever.  HENT: Negative for congestion, ear discharge and ear pain.   Eyes: Negative for redness and visual disturbance.  Respiratory: Negative for chest tightness and shortness of breath.   Cardiovascular: Negative for chest pain and leg swelling.  Genitourinary: Negative for difficulty urinating and dysuria.  Musculoskeletal: Negative for back pain and gait problem.  Skin: Negative for rash.  Neurological: Negative for light-headedness and headaches.  Psychiatric/Behavioral: Negative for agitation and behavioral problems.  All other systems reviewed and are negative.   Per HPI unless specifically indicated above   Allergies as of 05/23/2018   No Known Allergies     Medication List       Accurate as of May 23, 2018  9:55 AM. Always use your most recent med list.        acetaminophen 650 MG CR tablet Commonly known as:   TYLENOL Take 650 mg by mouth every 8 (eight) hours as needed for pain.   chlorproMAZINE 50 MG tablet Commonly known as:  THORAZINE Take 50 mg by mouth as needed. One tablet by mouth PRN agitation, max of 2 tablets, 100 mg in 24 hours   cloZAPine 100 MG tablet Commonly known as:  CLOZARIL Take 250 mg by mouth at bedtime.   cloZAPine 100 MG tablet Commonly known as:  CLOZARIL Take 150 mg by mouth every morning.   entecavir 0.5 MG tablet Commonly known as:  BARACLUDE TAKE (1) TABLET BY MOUTH ONCE DAILY.   FISH OIL + D3 1000-1000 MG-UNIT Caps TAKE 1 CAPSULE BY MOUTH TWICE DAILY.   FLUoxetine 20 MG capsule Commonly known as:  PROZAC TAKE 3 CAPSULES BY MOUTH ONCE DAILY.   fluticasone 50 MCG/ACT nasal spray Commonly known as:  FLONASE SPRAY 2 SPRAYS IN RIGHT NOSTRIL ONLY TWICE DAILY.   IBU 600 MG tablet Generic drug:  ibuprofen TAKE 1 TABLET BY MOUTH WITH FOOD 4 TIMES DAILY AS NEEDED FOR ACTIVE MODERATE PAIN UNRELIEVED BY ACETAMINOPHEN. MAX 2 DOSES/24 HR.   levonorgestrel 20 MCG/24HR IUD Commonly known as:  MIRENA 1 each by Intrauterine route once.   MINERIN Crea APPLY MODERATE AMOUNT TOPICALLY TO DRY SKIN ON SOLES & HEELS OF BOTH FEET AFTER SHOWER. (NURSING STAFF TO SUPERVISE APPLICATION)   multivitamin with minerals Tabs tablet Take 1 tablet by mouth daily.   SENTRY Tabs TAKE  1 TABLET BY MOUTH AT BEDTIME.   VENTOLIN HFA 108 (90 Base) MCG/ACT inhaler Generic drug:  albuterol INHALE 2 PUFFS EVERY 4 HOURS AS NEEDED FOR WHEEZING.   Vitamin D3 50 MCG (2000 UT) capsule TAKE 1 CAPSULE BY MOUTH EVERY MORNING.          Objective:    BP 104/71   Pulse 98   Temp 97.6 F (36.4 C) (Oral)   Ht 5' 3"  (1.6 m)   Wt 140 lb (63.5 kg)   BMI 24.80 kg/m   Wt Readings from Last 3 Encounters:  05/23/18 140 lb (63.5 kg)  04/26/18 135 lb (61.2 kg)  03/20/18 134 lb 8 oz (61 kg)    Physical Exam Vitals signs reviewed.  Constitutional:      General: She is not in acute  distress.    Appearance: She is well-developed. She is not diaphoretic.  Eyes:     Conjunctiva/sclera: Conjunctivae normal.     Pupils: Pupils are equal, round, and reactive to light.  Neck:     Musculoskeletal: Neck supple.     Thyroid: No thyromegaly.  Cardiovascular:     Rate and Rhythm: Normal rate and regular rhythm.     Heart sounds: Normal heart sounds. No murmur.  Pulmonary:     Effort: Pulmonary effort is normal. No respiratory distress.     Breath sounds: Normal breath sounds. No wheezing.  Chest:     Breasts: Breasts are symmetrical.        Right: No inverted nipple, mass, nipple discharge, skin change or tenderness.        Left: No inverted nipple, mass, nipple discharge, skin change or tenderness.  Abdominal:     General: Bowel sounds are normal. There is no distension.     Palpations: Abdomen is soft.     Tenderness: There is no abdominal tenderness. There is no guarding or rebound.  Genitourinary:    Exam position: Supine.     Labia:        Right: No rash or lesion.        Left: No rash or lesion.      Vagina: Normal.     Cervix: No cervical motion tenderness, discharge or friability.     Uterus: Not deviated, not enlarged, not fixed and not tender.      Adnexa:        Right: No mass or tenderness.         Left: No mass or tenderness.       Comments: Strings are visible from previous IUD in cervical os Musculoskeletal: Normal range of motion.  Lymphadenopathy:     Cervical: No cervical adenopathy.  Skin:    General: Skin is warm and dry.     Findings: No rash.  Neurological:     Mental Status: She is alert and oriented to person, place, and time.     Coordination: Coordination normal.  Psychiatric:        Behavior: Behavior normal.     IUD removal and reinsertion procedure: Patient was placed in stirrups and use a speculum.  Pap smear was performed and ring forceps were used to grasp the strings using ring forceps and remove the previous IUD.  Patient  was prepped with Betadine swabs using cotton balls. Tenaculum was used to grab the anterior cervix. Uterine sound was performed and found to be 7.5cm in length and anteroverted. Cervical dilation was not necessary. Mirena was placed using factory device at the correct depth  that was measured and deployed without issue. The strings were cut leaving 1.5 cm extra. Patient tolerated procedure well and bleeding was minimal.     Assessment & Plan:   Problem List Items Addressed This Visit      Other   IUD (intrauterine device) in place - Primary   Relevant Medications   levonorgestrel (MIRENA) 20 MCG/24HR IUD (Completed)   Other Relevant Orders   Pregnancy, urine (Completed)    Other Visit Diagnoses    Well woman exam       Relevant Orders   PAP IG, CT-NG, RFX HPV ALL   Encounter for IUD removal and reinsertion       Relevant Medications   levonorgestrel (MIRENA) 20 MCG/24HR IUD (Completed)   Other Relevant Orders   Pregnancy, urine (Completed)   PAP IG, CT-NG, RFX HPV ALL       Follow up plan: Return in about 6 months (around 11/21/2018), or if symptoms worsen or fail to improve.  Counseling provided for all of the vaccine components Orders Placed This Encounter  Procedures  . Pregnancy, urine    Caryl Pina, MD [facility] 05/23/2018, 9:55 AM

## 2018-05-28 LAB — PAP IG, CT-NG, RFX HPV ALL
CHLAMYDIA, NUC. ACID AMP: NEGATIVE
Gonococcus by Nucleic Acid Amp: NEGATIVE

## 2018-06-06 ENCOUNTER — Other Ambulatory Visit: Payer: Self-pay | Admitting: Family Medicine

## 2018-06-08 ENCOUNTER — Other Ambulatory Visit: Payer: Self-pay | Admitting: Family Medicine

## 2018-06-26 DIAGNOSIS — Z79899 Other long term (current) drug therapy: Secondary | ICD-10-CM | POA: Diagnosis not present

## 2018-07-02 ENCOUNTER — Other Ambulatory Visit: Payer: Self-pay | Admitting: Family Medicine

## 2018-08-23 ENCOUNTER — Other Ambulatory Visit: Payer: Self-pay | Admitting: Family Medicine

## 2018-08-29 ENCOUNTER — Other Ambulatory Visit (INDEPENDENT_AMBULATORY_CARE_PROVIDER_SITE_OTHER): Payer: Self-pay | Admitting: Internal Medicine

## 2018-08-29 DIAGNOSIS — B1910 Unspecified viral hepatitis B without hepatic coma: Secondary | ICD-10-CM

## 2018-09-04 DIAGNOSIS — F250 Schizoaffective disorder, bipolar type: Secondary | ICD-10-CM | POA: Diagnosis not present

## 2018-09-17 ENCOUNTER — Encounter (INDEPENDENT_AMBULATORY_CARE_PROVIDER_SITE_OTHER): Payer: Self-pay

## 2018-09-18 ENCOUNTER — Encounter (INDEPENDENT_AMBULATORY_CARE_PROVIDER_SITE_OTHER): Payer: Self-pay | Admitting: Internal Medicine

## 2018-09-18 ENCOUNTER — Ambulatory Visit (INDEPENDENT_AMBULATORY_CARE_PROVIDER_SITE_OTHER): Payer: Medicare Other | Admitting: Internal Medicine

## 2018-09-18 ENCOUNTER — Other Ambulatory Visit: Payer: Self-pay

## 2018-09-18 VITALS — BP 113/75 | HR 90 | Temp 98.3°F | Resp 18 | Ht 63.0 in | Wt 136.4 lb

## 2018-09-18 DIAGNOSIS — B1910 Unspecified viral hepatitis B without hepatic coma: Secondary | ICD-10-CM

## 2018-09-18 DIAGNOSIS — B181 Chronic viral hepatitis B without delta-agent: Secondary | ICD-10-CM

## 2018-09-18 MED ORDER — ENTECAVIR 0.5 MG PO TABS: ORAL_TABLET | ORAL | 11 refills | Status: DC

## 2018-09-18 NOTE — Patient Instructions (Signed)
Physician will call with results of blood test when completed.

## 2018-09-18 NOTE — Progress Notes (Signed)
Presenting complaint;  Follow-up for chronic hepatitis B.  Database and subjective:  Patient is 48 year old female who has chronic hepatitis B.  She had liver biopsy in December 2016 and has been on anti-viral therapy since January 2017.  HBV DNA has become undetectable but she has not cleared the virus.  Last testing was 6 months ago.  She is cared for at an rest home and Ms. [place] both son could not come in because of COVID-19 pandemic.  She has no complaints.  She denies nausea vomiting abdominal pain pruritus weakness skin rash or joint pains.  She states she had a good appetite.  She walks outside the facility for at least 15 minutes daily.  She has gained 2 pounds since her last visit.    Current Medications: Outpatient Encounter Medications as of 09/18/2018  Medication Sig  . acetaminophen (TYLENOL) 650 MG CR tablet Take 650 mg by mouth every 8 (eight) hours as needed for pain.  . chlorproMAZINE (THORAZINE) 50 MG tablet Take 50 mg by mouth as needed. One tablet by mouth PRN agitation, max of 2 tablets, 100 mg in 24 hours  . Cholecalciferol (VITAMIN D3) 2000 units capsule TAKE 1 CAPSULE BY MOUTH EVERY MORNING.  . cloZAPine (CLOZARIL) 100 MG tablet Take 250 mg by mouth at bedtime.  . cloZAPine (CLOZARIL) 100 MG tablet Take 150 mg by mouth every morning.   . entecavir (BARACLUDE) 0.5 MG tablet TAKE (1) TABLET BY MOUTH ONCE DAILY.  Marland Kitchen Fish Oil-Cholecalciferol (FISH OIL + D3) 1000-1000 MG-UNIT CAPS TAKE 1 CAPSULE BY MOUTH TWICE DAILY.  Marland Kitchen FLUoxetine (PROZAC) 20 MG capsule TAKE 3 CAPSULES BY MOUTH ONCE DAILY.  . fluticasone (FLONASE) 50 MCG/ACT nasal spray SPRAY 2 SPRAYS IN RIGHT NOSTRIL ONLY TWICE DAILY.  . IBU 600 MG tablet TAKE 1 TABLET BY MOUTH WITH FOOD 4 TIMES DAILY AS NEEDED FOR ACTIVE MODERATE PAIN UNRELIEVED BY ACETAMINOPHEN. MAX 2 DOSES/24 HR.  Marland Kitchen levonorgestrel (MIRENA) 20 MCG/24HR IUD 1 each by Intrauterine route once.  . Multiple Vitamin (MULTIVITAMIN WITH MINERALS) TABS  tablet Take 1 tablet by mouth daily.  . Multiple Vitamins-Minerals (SENTRY) TABS TAKE 1 TABLET BY MOUTH AT BEDTIME.  . Skin Protectants, Misc. (MINERIN) CREA APPLY MODERATE AMOUNT TOPICALLY TO DRY SKIN ON SOLES & HEELS OF BOTH FEET AFTER SHOWER. (NURSING STAFF TO SUPERVISE APPLICATION)  . VENTOLIN HFA 108 (90 Base) MCG/ACT inhaler INHALE 2 PUFFS EVERY 4 HOURS AS NEEDED FOR WHEEZING.   No facility-administered encounter medications on file as of 09/18/2018.      Objective: Blood pressure 113/75, pulse 90, temperature 98.3 F (36.8 C), temperature source Oral, resp. rate 18, height 5' 3" (1.6 m), weight 136 lb 6.4 oz (61.9 kg). Patient is alert and in no acute distress. Conjunctiva is pink. Sclera is nonicteric Oropharyngeal mucosa is normal. No neck masses or thyromegaly noted. Cardiac exam with regular rhythm normal S1 and S2. No murmur or gallop noted. Lungs are clear to auscultation. Abdomen is symmetrical soft and nontender without organomegaly or masses. No LE edema or clubbing noted.  Labs/studies Results:  CBC Latest Ref Rng & Units 03/14/2018 09/06/2017 04/19/2017  WBC 3.4 - 10.8 x10E3/uL 8.2 8.7 10.5  Hemoglobin 11.1 - 15.9 g/dL 12.2 12.9 13.3  Hematocrit 34.0 - 46.6 % 36.3 39.6 41.9  Platelets 150 - 450 x10E3/uL 274 262 242    CMP Latest Ref Rng & Units 03/14/2018 09/06/2017 04/19/2017  Glucose 65 - 99 mg/dL 86 98 -  BUN 6 - 24 mg/dL 7  9 -  Creatinine 0.57 - 1.00 mg/dL 0.74 0.71 -  Sodium 134 - 144 mmol/L 144 143 -  Potassium 3.5 - 5.2 mmol/L 4.4 3.9 -  Chloride 96 - 106 mmol/L 105 105 -  CO2 20 - 29 mmol/L 26 27 -  Calcium 8.7 - 10.2 mg/dL 9.1 9.0 -  Total Protein 6.0 - 8.5 g/dL 6.0 6.3 6.8  Total Bilirubin 0.0 - 1.2 mg/dL 0.3 0.2 0.5  Alkaline Phos 39 - 117 IU/L 88 79 72  AST 0 - 40 IU/L 17 20 14(L)  ALT 0 - 32 IU/L 13 14 13(L)    Hepatic Function Latest Ref Rng & Units 03/14/2018 09/06/2017 04/19/2017  Total Protein 6.0 - 8.5 g/dL 6.0 6.3 6.8  Albumin 3.5 - 5.5 g/dL  3.8 4.3 3.9  AST 0 - 40 IU/L 17 20 14(L)  ALT 0 - 32 IU/L 13 14 13(L)  Alk Phosphatase 39 - 117 IU/L 88 79 72  Total Bilirubin 0.0 - 1.2 mg/dL 0.3 0.2 0.5  Bilirubin, Direct 0.1 - 0.5 mg/dL - - 0.1    Lab data from 03/14/2018 HBV DNA by PCR was undetectable Hepatitis B surface antigen positive.  Assessment:  #1.  Chronic hepatitis B.  Patient has been on entecavir for almost 3-1/2 years.  Last evaluation was in December 2019 or after completing therapy for 3 years and she has not spontaneously converted although HBV DNA has been undetectable. Goal at this time is to continue therapy for total of 5 years unless she develops viral resistance in which case therapy would be stopped.   Plan:  Continue entecavir at a dose of 0.5 mg daily. Patient will go to the lab for CBC, comprehensive chemistry panel, HBV DNA by PCR, hepatitis B surface antigen and HBe antibody. Office visit in 6 months.

## 2018-09-22 LAB — COMPREHENSIVE METABOLIC PANEL
AG Ratio: 2.2 (calc) (ref 1.0–2.5)
ALT: 11 U/L (ref 6–29)
AST: 14 U/L (ref 10–35)
Albumin: 4.2 g/dL (ref 3.6–5.1)
Alkaline phosphatase (APISO): 64 U/L (ref 31–125)
BUN: 8 mg/dL (ref 7–25)
CO2: 29 mmol/L (ref 20–32)
Calcium: 9.3 mg/dL (ref 8.6–10.2)
Chloride: 104 mmol/L (ref 98–110)
Creat: 0.7 mg/dL (ref 0.50–1.10)
Globulin: 1.9 g/dL (calc) (ref 1.9–3.7)
Glucose, Bld: 85 mg/dL (ref 65–139)
Potassium: 4 mmol/L (ref 3.5–5.3)
Sodium: 139 mmol/L (ref 135–146)
Total Bilirubin: 0.7 mg/dL (ref 0.2–1.2)
Total Protein: 6.1 g/dL (ref 6.1–8.1)

## 2018-09-22 LAB — HEPATITIS B SURFACE ANTIGEN: Hepatitis B Surface Ag: REACTIVE — AB

## 2018-09-22 LAB — CBC
HCT: 37.4 % (ref 35.0–45.0)
Hemoglobin: 12.1 g/dL (ref 11.7–15.5)
MCH: 29.8 pg (ref 27.0–33.0)
MCHC: 32.4 g/dL (ref 32.0–36.0)
MCV: 92.1 fL (ref 80.0–100.0)
MPV: 9.4 fL (ref 7.5–12.5)
Platelets: 218 10*3/uL (ref 140–400)
RBC: 4.06 10*6/uL (ref 3.80–5.10)
RDW: 11.6 % (ref 11.0–15.0)
WBC: 7.6 10*3/uL (ref 3.8–10.8)

## 2018-09-22 LAB — HEPATITIS B DNA, ULTRAQUANTITATIVE, PCR
Hepatitis B DNA (Calc): <1 Log IU/mL
Hepatitis B DNA: <10 IU/mL

## 2018-09-22 LAB — HEPATITIS B E ANTIBODY: Hep B E Ab: REACTIVE — AB

## 2018-09-25 ENCOUNTER — Other Ambulatory Visit: Payer: Self-pay

## 2018-09-25 MED ORDER — PERMETHRIN 5 % EX CREA: 1.0000 "application " | TOPICAL_CREAM | Freq: Once | CUTANEOUS | 0 refills | Status: AC

## 2018-09-25 NOTE — Progress Notes (Signed)
Rx sent in per Dr. Warrick Parisian.

## 2018-10-02 DIAGNOSIS — F250 Schizoaffective disorder, bipolar type: Secondary | ICD-10-CM | POA: Diagnosis not present

## 2018-10-10 ENCOUNTER — Other Ambulatory Visit: Payer: Self-pay | Admitting: Family Medicine

## 2018-11-15 ENCOUNTER — Encounter: Payer: Self-pay | Admitting: Family Medicine

## 2018-11-15 ENCOUNTER — Ambulatory Visit (INDEPENDENT_AMBULATORY_CARE_PROVIDER_SITE_OTHER): Payer: Medicare Other | Admitting: Family Medicine

## 2018-11-15 DIAGNOSIS — I951 Orthostatic hypotension: Secondary | ICD-10-CM

## 2018-11-15 DIAGNOSIS — E782 Mixed hyperlipidemia: Secondary | ICD-10-CM | POA: Diagnosis not present

## 2018-11-15 DIAGNOSIS — R350 Frequency of micturition: Secondary | ICD-10-CM

## 2018-11-15 NOTE — Progress Notes (Signed)
Virtual Visit via telephone Note  I connected with Margaret [name] on 11/15/18 at 1039 by telephone and verified that I am speaking with the correct person using two identifiers. [name] and [place], caregiver are currently with her during visit. The provider, Fransisca Kaufmann Kahlin Mark, MD is located in their office at time of visit.  Call ended at 1050  I discussed the limitations, risks, security and privacy concerns of performing an evaluation and management service by telephone and the availability of in person appointments. I also discussed with the patient that there may be a patient responsible charge related to this service. The patient expressed understanding and agreed to proceed.   History and Present Illness: Hyperlipidemia Patient is coming in for recheck of his hyperlipidemia. The patient is currently taking fish oils. They deny any issues with myalgias or history of liver damage from it. They deny any focal numbness or weakness or chest pain.   Patient is still having some urinary frequency and leakage and they thought it was related to mood and sees Dr Tamera Punt tomorrow and she has been isolated and struggling with this since covid.   No diagnosis found.  Outpatient Encounter Medications as of 11/15/2018  Medication Sig  . acetaminophen (TYLENOL) 650 MG CR tablet Take 650 mg by mouth every 8 (eight) hours as needed for pain.  . chlorproMAZINE (THORAZINE) 50 MG tablet Take 50 mg by mouth as needed. One tablet by mouth PRN agitation, max of 2 tablets, 100 mg in 24 hours  . Cholecalciferol (VITAMIN D3) 2000 units capsule TAKE 1 CAPSULE BY MOUTH EVERY MORNING.  . cloZAPine (CLOZARIL) 100 MG tablet Take 250 mg by mouth at bedtime.  . cloZAPine (CLOZARIL) 100 MG tablet Take 150 mg by mouth every morning.   . entecavir (BARACLUDE) 0.5 MG tablet TAKE (1) TABLET BY MOUTH ONCE DAILY.  Marland Kitchen Fish Oil-Cholecalciferol (FISH OIL + D3) 1000-1000  MG-UNIT CAPS TAKE 1 CAPSULE BY MOUTH TWICE DAILY.  Marland Kitchen FLUoxetine (PROZAC) 20 MG capsule TAKE 3 CAPSULES BY MOUTH ONCE DAILY.  . fluticasone (FLONASE) 50 MCG/ACT nasal spray SPRAY 2 SPRAYS IN RIGHT NOSTRIL ONLY TWICE DAILY.  Marland Kitchen ibuprofen (ADVIL) 600 MG tablet TAKE 1 TABLET BY MOUTH WITH FOOD 4 TIMES DAILY AS NEEDED FOR ACTIVE MODERATE PAIN UNRELIEVED BY ACETAMINOPHEN. MAX 2 DOSES/24 HR.  Marland Kitchen levonorgestrel (MIRENA) 20 MCG/24HR IUD 1 each by Intrauterine route once.  . Multiple Vitamin (MULTIVITAMIN WITH MINERALS) TABS tablet Take 1 tablet by mouth daily.  . Multiple Vitamins-Minerals (SENTRY) TABS TAKE 1 TABLET BY MOUTH AT BEDTIME.  . Skin Protectants, Misc. (MINERIN) CREA APPLY MODERATE AMOUNT TOPICALLY TO DRY SKIN ON SOLES & HEELS OF BOTH FEET AFTER SHOWER. (NURSING STAFF TO SUPERVISE APPLICATION)  . VENTOLIN HFA 108 (90 Base) MCG/ACT inhaler INHALE 2 PUFFS EVERY 4 HOURS AS NEEDED FOR WHEEZING.   No facility-administered encounter medications on file as of 11/15/2018.     Review of Systems  Constitutional: Negative for chills and fever.  Eyes: Negative for visual disturbance.  Respiratory: Negative for chest tightness and shortness of breath.   Cardiovascular: Negative for chest pain and leg swelling.  Genitourinary: Positive for frequency. Negative for difficulty urinating, dysuria, vaginal bleeding, vaginal discharge and vaginal pain.  Musculoskeletal: Negative for back pain and gait problem.  Skin: Negative for rash.  Neurological: Negative for light-headedness and headaches.  Psychiatric/Behavioral: Negative for agitation and behavioral problems.  All other systems reviewed and are negative.  Observations/Objective: Patient sounds comfortable  Assessment and Plan: Problem List Items Addressed This Visit      Cardiovascular and Mediastinum   Orthostatic hypotension     Other   Urinary frequency   Hyperlipidemia - Primary       Follow Up Instructions: Follow up in 6 months.    Will do labs next time  Continue medication   I discussed the assessment and treatment plan with the patient. The patient was provided an opportunity to ask questions and all were answered. The patient agreed with the plan and demonstrated an understanding of the instructions.   The patient was advised to call back or seek an in-person evaluation if the symptoms worsen or if the condition fails to improve as anticipated.  The above assessment and management plan was discussed with the patient. The patient verbalized understanding of and has agreed to the management plan. Patient is aware to call the clinic if symptoms persist or worsen. Patient is aware when to return to the clinic for a follow-up visit. Patient educated on when it is appropriate to go to the emergency department.    I provided 11 minutes of non-face-to-face time during this encounter.    Worthy Rancher, MD

## 2018-11-29 ENCOUNTER — Other Ambulatory Visit: Payer: Self-pay | Admitting: Family Medicine

## 2018-12-07 DIAGNOSIS — F250 Schizoaffective disorder, bipolar type: Secondary | ICD-10-CM | POA: Diagnosis not present

## 2018-12-21 ENCOUNTER — Other Ambulatory Visit: Payer: Self-pay | Admitting: Family Medicine

## 2019-02-19 ENCOUNTER — Other Ambulatory Visit: Payer: Self-pay

## 2019-02-20 ENCOUNTER — Ambulatory Visit (INDEPENDENT_AMBULATORY_CARE_PROVIDER_SITE_OTHER): Payer: Medicare Other

## 2019-02-20 DIAGNOSIS — Z23 Encounter for immunization: Secondary | ICD-10-CM

## 2019-03-19 ENCOUNTER — Ambulatory Visit (INDEPENDENT_AMBULATORY_CARE_PROVIDER_SITE_OTHER): Payer: Medicare Other | Admitting: Internal Medicine

## 2019-03-19 ENCOUNTER — Other Ambulatory Visit: Payer: Self-pay

## 2019-03-19 ENCOUNTER — Encounter (INDEPENDENT_AMBULATORY_CARE_PROVIDER_SITE_OTHER): Payer: Self-pay | Admitting: Internal Medicine

## 2019-03-19 VITALS — BP 118/77 | HR 105 | Temp 97.3°F | Ht 63.0 in | Wt 128.6 lb

## 2019-03-19 DIAGNOSIS — B181 Chronic viral hepatitis B without delta-agent: Secondary | ICD-10-CM

## 2019-03-19 DIAGNOSIS — R634 Abnormal weight loss: Secondary | ICD-10-CM | POA: Diagnosis not present

## 2019-03-19 NOTE — Progress Notes (Signed)
Presenting complaint;  Follow-up for chronic hepatitis B.  Database and subjective:  Patient is 48 year old female who has chronic hepatitis B and has been on entecavir since January 2017.  She will be completing 4 years next month.  She was last seen 6 months ago.  Her transaminases have remained normal.  HBV DNA by PCR has been undetectable.  Hepatitis B E antibody is reactive but she remains with [place] antigen.  Last blood work was 6 months ago. She is accompanied by her caregiver Manuela Schwartz today. Patient has no complaints.  She remains with good appetite.  She denies nausea vomiting heartburn dysphagia abdominal pain or pruritus.  She also denies melena or rectal bleeding.  Her appetite is good.  She takes 1-2 doses of ibuprofen per month.  She has lost 8 pounds since her last visit. According to Manuela Schwartz her weight was 150 pounds 3 years ago. Patient walks a lot daily.  She walks anywhere from more than 30 minutes to more than an hour.   Current Medications: Outpatient Encounter Medications as of 03/19/2019  Medication Sig  . acetaminophen (TYLENOL) 650 MG CR tablet Take 650 mg by mouth every 8 (eight) hours as needed for pain.  . chlorproMAZINE (THORAZINE) 50 MG tablet Take 50 mg by mouth as needed. One tablet by mouth PRN agitation, max of 2 tablets, 100 mg in 24 hours  . Cholecalciferol (VITAMIN D3) 50 MCG (2000 UT) capsule TAKE 1 CAPSULE BY MOUTH EVERY MORNING.  . cloZAPine (CLOZARIL) 100 MG tablet Take 250 mg by mouth at bedtime.  . cloZAPine (CLOZARIL) 100 MG tablet Take 150 mg by mouth every morning.   . entecavir (BARACLUDE) 0.5 MG tablet TAKE (1) TABLET BY MOUTH ONCE DAILY.  Marland Kitchen Fish Oil-Cholecalciferol (FISH OIL + D3) 1200-1000 MG-UNIT CAPS TAKE 1 CAPSULE BY MOUTH TWICE DAILY.  Marland Kitchen FLUoxetine (PROZAC) 20 MG capsule TAKE 3 CAPSULES BY MOUTH ONCE DAILY.  . fluticasone (FLONASE) 50 MCG/ACT nasal spray SPRAY 2 SPRAYS IN RIGHT NOSTRIL ONLY TWICE DAILY.  Marland Kitchen ibuprofen (ADVIL) 600 MG tablet  TAKE 1 TABLET BY MOUTH WITH FOOD 4 TIMES DAILY AS NEEDED FOR ACTIVE MODERATE PAIN UNRELIEVED BY ACETAMINOPHEN. MAX 2 DOSES/24 HR.  Marland Kitchen levonorgestrel (MIRENA) 20 MCG/24HR IUD 1 each by Intrauterine route once.  . Multiple Vitamin (MULTIVITAMIN WITH MINERALS) TABS tablet Take 1 tablet by mouth daily.  . Multiple Vitamins-Minerals (CENTROVITE) TABS TAKE 1 TABLET BY MOUTH AT BEDTIME.  . Skin Protectants, Misc. (MINERIN) CREA APPLY MODERATE AMOUNT TOPICALLY TO DRY SKIN ON SOLES & HEELS OF BOTH FEET AFTER SHOWER. (NURSING STAFF TO SUPERVISE APPLICATION)  . VENTOLIN HFA 108 (90 Base) MCG/ACT inhaler INHALE 2 PUFFS EVERY 4 HOURS AS NEEDED FOR WHEEZING.   No facility-administered encounter medications on file as of 03/19/2019.     Objective: Blood pressure 118/77, pulse (!) 105, temperature (!) 97.3 F (36.3 C), temperature source Oral, height _0  (1.6 m), weight 128 lb 9.6 oz (58.3 kg). Patient is alert and in no acute distress. She is wearing facial mask. Conjunctiva is pink. Sclera is nonicteric Oropharyngeal mucosa is normal. No neck masses or thyromegaly noted. Cardiac exam with regular rhythm normal S1 and S2. No murmur or gallop noted. Lungs are clear to auscultation. Abdomen is flat soft and nontender with organomegaly or masses. No LE edema or clubbing noted.  Labs/studies Results:  CBC Latest Ref Rng & Units 09/18/2018 03/14/2018 09/06/2017  WBC 3.8 - 10.8 Thousand/uL 7.6 8.2 8.7  Hemoglobin 11.7 - 15.5 g/dL 12.1  12.2 12.9  Hematocrit 35.0 - 45.0 % 37.4 36.3 39.6  Platelets 140 - 400 Thousand/uL 218 274 262    CMP Latest Ref Rng & Units 09/18/2018 03/14/2018 09/06/2017  Glucose 65 - 139 mg/dL 85 86 98  BUN 7 - 25 mg/dL _0 Creatinine 0.50 - 1.10 mg/dL 0.70 0.74 0.71  Sodium 135 - 146 mmol/L 139 144 143  Potassium 3.5 - 5.3 mmol/L 4.0 4.4 3.9  Chloride 98 - 110 mmol/L 104 105 105  CO2 20 - 32 mmol/L _1 Calcium 8.6 - 10.2 mg/dL 9.3 9.1 9.0  Total Protein 6.1 - 8.1 g/dL  6.1 6.0 6.3  Total Bilirubin 0.2 - 1.2 mg/dL 0.7 0.3 0.2  Alkaline Phos 39 - 117 IU/L - 88 79  AST 10 - 35 U/L _2 ALT 6 - 29 U/L _3 Hepatic Function Latest Ref Rng & Units 09/18/2018 03/14/2018 09/06/2017  Total Protein 6.1 - 8.1 g/dL 6.1 6.0 6.3  Albumin 3.5 - 5.5 g/dL - 3.8 4.3  AST 10 - 35 U/L _4 ALT 6 - 29 U/L _5 Alk Phosphatase 39 - 117 IU/L - 88 79  Total Bilirubin 0.2 - 1.2 mg/dL 0.7 0.3 0.2  Bilirubin, Direct 0.1 - 0.5 mg/dL - - -    Lab data from 09/18/2018 HBV DNA by PCR was undetectable Hepatitis B E antibody reactive Hepatitis B surface antigen positive.  Assessment:  #1.  Chronic hepatitis B.  She will be completing 4 years of entecavir next month.  Viral activity has been effectively suppressed and her transaminases are normal and hepatitis B e antibody is reactive but she has not cleared the virus yet.  Since she is doing well with therapy may be inclined to continue for another year at which time therapy would be stopped and course monitored.  #2.  Weight loss.  Patient has lost 8 pounds in the last 6 months.  However there has been any significant weight changes since her first visit to our office in August 2016.  Need to rule out thyrotoxicosis.  Recent weight loss may be due to excessive physical activity. Patient may benefit from 2 snacks in between meals.  Plan:  Patient advised to eat 2 snacks between meals daily. Patient will go to the lab for CBC, c-Met, TSH, hepatitis B surface antigen and HBV DNA by PCR. Office visit in 6 months.

## 2019-03-19 NOTE — Patient Instructions (Signed)
Physician will call the results of blood test when completed.

## 2019-03-26 LAB — HEPATITIS B DNA, ULTRAQUANTITATIVE, PCR
Hepatitis B DNA (Calc): <1 {Log_IU}/mL
Hepatitis B DNA: <10 [IU]/mL

## 2019-03-26 LAB — CBC
HCT: 35.6 % (ref 35.0–45.0)
Hemoglobin: 11.8 g/dL (ref 11.7–15.5)
MCH: 29.8 pg (ref 27.0–33.0)
MCHC: 33.1 g/dL (ref 32.0–36.0)
MCV: 89.9 fL (ref 80.0–100.0)
MPV: 9.4 fL (ref 7.5–12.5)
Platelets: 292 Thousand/uL (ref 140–400)
RBC: 3.96 Million/uL (ref 3.80–5.10)
RDW: 11.8 % (ref 11.0–15.0)
WBC: 6.8 Thousand/uL (ref 3.8–10.8)

## 2019-03-26 LAB — COMPREHENSIVE METABOLIC PANEL WITH GFR
AG Ratio: 1.8 (calc) (ref 1.0–2.5)
ALT: 10 U/L (ref 6–29)
AST: 10 U/L (ref 10–35)
Albumin: 4 g/dL (ref 3.6–5.1)
Alkaline phosphatase (APISO): 83 U/L (ref 31–125)
BUN: 9 mg/dL (ref 7–25)
CO2: 28 mmol/L (ref 20–32)
Calcium: 9.1 mg/dL (ref 8.6–10.2)
Chloride: 104 mmol/L (ref 98–110)
Creat: 0.67 mg/dL (ref 0.50–1.10)
Globulin: 2.2 g/dL (ref 1.9–3.7)
Glucose, Bld: 81 mg/dL (ref 65–139)
Potassium: 3.8 mmol/L (ref 3.5–5.3)
Sodium: 140 mmol/L (ref 135–146)
Total Bilirubin: 0.5 mg/dL (ref 0.2–1.2)
Total Protein: 6.2 g/dL (ref 6.1–8.1)

## 2019-03-26 LAB — TSH: TSH: 1.87 m[IU]/L

## 2019-03-26 LAB — HEPATITIS B SURFACE ANTIGEN: Hepatitis B Surface Ag: REACTIVE — AB

## 2019-04-02 ENCOUNTER — Other Ambulatory Visit: Payer: Self-pay | Admitting: Family Medicine

## 2019-04-18 DIAGNOSIS — F250 Schizoaffective disorder, bipolar type: Secondary | ICD-10-CM | POA: Diagnosis not present

## 2019-04-19 ENCOUNTER — Other Ambulatory Visit: Payer: Medicare Other

## 2019-04-19 ENCOUNTER — Other Ambulatory Visit: Payer: Self-pay

## 2019-04-19 DIAGNOSIS — Z79899 Other long term (current) drug therapy: Secondary | ICD-10-CM | POA: Diagnosis not present

## 2019-04-23 ENCOUNTER — Telehealth (INDEPENDENT_AMBULATORY_CARE_PROVIDER_SITE_OTHER): Payer: Self-pay | Admitting: Internal Medicine

## 2019-04-23 NOTE — Telephone Encounter (Signed)
Patients caregiver called stated Dr Laural Golden wanted to see patient if she lost more than 5 pounds - can you ask him about this and find out when he can see her?

## 2019-04-24 NOTE — Telephone Encounter (Signed)
Per Dr.Rehman the patient is to kep a food diary for 1 month.  Margaret [name] - he wants her to have appointment with him either next week or the week after.  I have called and given instruction to her caregiver.

## 2019-04-29 ENCOUNTER — Ambulatory Visit (INDEPENDENT_AMBULATORY_CARE_PROVIDER_SITE_OTHER): Payer: Medicare Other | Admitting: *Deleted

## 2019-04-29 DIAGNOSIS — Z0000 Encounter for general adult medical examination without abnormal findings: Secondary | ICD-10-CM

## 2019-04-29 NOTE — Patient Instructions (Signed)

## 2019-04-29 NOTE — Progress Notes (Signed)
MEDICARE ANNUAL WELLNESS VISIT  04/29/2019  Telephone Visit Disclaimer This Medicare AWV was conducted by telephone due to national recommendations for restrictions regarding the COVID-19 Pandemic (e.g. social distancing).  I verified, using two identifiers, that I am speaking with Margaret [name] or their authorized healthcare agent. I discussed the limitations, risks, security, and privacy concerns of performing an evaluation and management service by telephone and the potential availability of an in-person appointment in the future. The patient expressed understanding and agreed to proceed.   Subjective:  Margaret [name] is a 49 y.o. female patient of Dettinger, Fransisca Kaufmann, MD who had a Medicare Annual Wellness Visit today via telephone.The Group Home manager Margaret [name] was on the phone to help as well. Margaret [name] is Disabled and lives at [facility]. she has 0 children. she reports that she is socially active and does interact with friends/family regularly. she is moderately physically active and enjoys talking on the phone with her family, crafting, playing UNO, Lamb and memory games.  Patient Care Team: Dettinger, Fransisca Kaufmann, MD as PCP - General (Family Medicine) Rogene Houston, MD as Consulting Physician (Gastroenterology)  Advanced Directives 04/29/2019 04/26/2018 05/09/2016 03/24/2015  Does Patient Have a Medical Advance Directive? No No (No Data) No  Would patient like information on creating a medical advance directive? No - Patient declined Yes (MAU/Ambulatory/Procedural Areas - Information given) (No Data) No - patient declined information    Hospital Utilization Over the Past 12 Months: # of hospitalizations or ER visits: 0 # of surgeries: 0  Review of Systems    Patient reports that her overall health is unchanged compared to last year.  History obtained from chart review  Patient Reported Readings (BP, Pulse, CBG, Weight, etc) none  Pain Assessment Pain :  No/denies pain     Current Medications & Allergies (verified) Allergies as of 04/29/2019   No Known Allergies     Medication List       Accurate as of April 29, 2019 11:13 AM. If you have any questions, ask your nurse or doctor.        acetaminophen 650 MG CR tablet Commonly known as: TYLENOL Take 650 mg by mouth every 8 (eight) hours as needed for pain.   CENTROVITE Tabs TAKE 1 TABLET BY MOUTH AT BEDTIME.   chlorproMAZINE 50 MG tablet Commonly known as: THORAZINE Take 50 mg by mouth as needed. One tablet by mouth PRN agitation, max of 2 tablets, 100 mg in 24 hours   cloZAPine 100 MG tablet Commonly known as: CLOZARIL Take 250 mg by mouth at bedtime.   cloZAPine 100 MG tablet Commonly known as: CLOZARIL Take 150 mg by mouth every morning.   entecavir 0.5 MG tablet Commonly known as: BARACLUDE TAKE (1) TABLET BY MOUTH ONCE DAILY.   Fish Oil + D3 1200-1000 MG-UNIT Caps TAKE 1 CAPSULE BY MOUTH TWICE DAILY.   FLUoxetine 20 MG capsule Commonly known as: PROZAC TAKE 3 CAPSULES BY MOUTH ONCE DAILY.   fluticasone 50 MCG/ACT nasal spray Commonly known as: FLONASE SPRAY 2 SPRAYS IN RIGHT NOSTRIL ONLY TWICE DAILY.   ibuprofen 600 MG tablet Commonly known as: ADVIL TAKE 1 TABLET BY MOUTH WITH FOOD 4 TIMES DAILY AS NEEDED FOR ACTIVE MODERATE PAIN UNRELIEVED BY ACETAMINOPHEN. MAX 2 DOSES/24 HR.   levonorgestrel 20 MCG/24HR IUD Commonly known as: MIRENA 1 each by Intrauterine route once.   Minerin Creme Crea APPLY MODERATE AMOUNT TOPICALLY TO DRY SKIN ON SOLES & HEELS OF BOTH  FEET AFTER SHOWER. (NURSING STAFF TO SUPERVISE APPLICATION)   Ventolin HFA 108 (90 Base) MCG/ACT inhaler Generic drug: albuterol INHALE 2 PUFFS EVERY 4 HOURS AS NEEDED FOR WHEEZING.   Vitamin D3 50 MCG (2000 UT) capsule TAKE 1 CAPSULE BY MOUTH EVERY MORNING.       History (reviewed): Past Medical History:  Diagnosis Date  . Hematuria 11/12/2014  . Hepatitis B carrier ([place])   .  Hypothyroidism   . Impulse disorder   . IUD (intrauterine device) in place 11/12/2014   Inserted 12/24/12 at [facility]  . Mild intellectual disability   . Schizophrenia ([place])   . Schizophrenia ([place])   . Tuberculosis    as a child  . Urinary frequency 11/12/2014   Past Surgical History:  Procedure Laterality Date  . ARM WOUND REPAIR / CLOSURE    . TRACHEAL SURGERY    . TRACHEOSTOMY     Family History  Adopted: Yes  Family history unknown: Yes   Social History   Socioeconomic History  . Marital status: Single    Spouse name: Not on file  . Number of children: 0  . Years of education: 61  . Highest education level: High school graduate  Occupational History  . Occupation: disabled    Comment: due to schizophrenia  Tobacco Use  . Smoking status: Current Every Day Smoker    Packs/day: 0.00  . Smokeless tobacco: Never Used  Substance and Sexual Activity  . Alcohol use: No    Alcohol/week: 0.0 standard drinks  . Drug use: No  . Sexual activity: Not Currently    Birth control/protection: I.U.D.  Other Topics Concern  . Not on file  Social History Narrative   Patient is adopted. She does not have any children and has never been married. She lives in a group home and is disabled. Has a diagnosis of schizophrenia.    Social Determinants of Health   Financial Resource Strain: Low Risk   . Difficulty of Paying Living Expenses: Not hard at all  Food Insecurity: No Food Insecurity  . Worried About Charity fundraiser in the Last Year: Never true  . Ran Out of Food in the Last Year: Never true  Transportation Needs: No Transportation Needs  . Lack of Transportation (Medical): No  . Lack of Transportation (Non-Medical): No  Physical Activity: Sufficiently Active  . Days of Exercise per Week: 4 days  . Minutes of Exercise per Session: 50 min  Stress: No Stress Concern Present  . Feeling of Stress : Only a little  Social Connections: Moderately Isolated  . Frequency of  Communication with Friends and Family: More than three times a week  . Frequency of Social Gatherings with Friends and Family: More than three times a week  . Attends Religious Services: Never  . Active Member of Clubs or Organizations: No  . Attends Archivist Meetings: Never  . Marital Status: Never married    Activities of Daily Living In your present state of health, do you have any difficulty performing the following activities: 04/29/2019  Hearing? N  Vision? Y  Comment is supposed to wear glasses all the time-refuses to wear them-gets yearly eye exam  Difficulty concentrating or making decisions? Y  Comment due to her schizophrenia  Walking or climbing stairs? N  Dressing or bathing? N  Doing errands, shopping? Y  Comment Her caregiver drives her to all appts  Preparing Food and eating ? Y  Comment All meals and snacks are prepared  for her at the [place]? N  In the past six months, have you accidently leaked urine? Y  Comment wears depends-she refuses to go to the bathroom at time  Do you have problems with loss of bowel control? N  Managing your Medications? Y  Comment Group Home manages all of her medications  Managing your Finances? Y  Comment Group home manages all her finances  Housekeeping or managing your Housekeeping? Y  Comment she can do some of the housekeeping but most of it is done by the Group Home  Some recent data might be hidden    Patient Education/ Literacy How often do you need to have someone help you when you read instructions, pamphlets, or other written materials from your doctor or pharmacy?: 2 - Rarely What is the last grade level you completed in school?: 12th grade  Exercise Current Exercise Habits: Home exercise routine, Type of exercise: walking, Time (Minutes): 45, Frequency (Times/Week): 4, Weekly Exercise (Minutes/Week): 180, Intensity: Mild, Exercise limited by: psychological condition(s)  Diet Patient  reports consuming 1 meals a day and 1 snack(s) a day Patient reports that her primary diet is: Regular Patient reports that she does have regular access to food.   Depression Screen PHQ 2/9 Scores 04/29/2019 05/23/2018 04/26/2018 03/14/2018 09/06/2017 04/20/2017 03/08/2017  PHQ - 2 Score 2 2 4 2 2  - 5  PHQ- 9 Score - 9 11 5 6  - 18  Exception Documentation - - - - - Other- indicate reason in comment box -  Not completed - - - - - Difficult to screen due to schizophrenia -     Fall Risk Fall Risk  04/29/2019 05/23/2018 04/26/2018 09/06/2017 04/20/2017  Falls in the past year? 0 0 0 No No     Objective:  Margaret [name] seemed alert and oriented and she participated appropriately during our telephone visit.  Blood Pressure Weight BMI  BP Readings from Last 3 Encounters:  03/19/19 118/77  09/18/18 113/75  05/23/18 104/71   Wt Readings from Last 3 Encounters:  03/19/19 128 lb 9.6 oz (58.3 kg)  09/18/18 136 lb 6.4 oz (61.9 kg)  05/23/18 140 lb (63.5 kg)   BMI Readings from Last 1 Encounters:  03/19/19 22.78 kg/m    *Unable to obtain current vital signs, weight, and BMI due to telephone visit type  Hearing/Vision  . Margaret [name] did not seem to have difficulty with hearing/understanding during the telephone conversation . Reports that she has had a formal eye exam by an eye care professional within the past year . Reports that she has not had a formal hearing evaluation within the past year *Unable to fully assess hearing and vision during telephone visit type  Cognitive Function: Pt unable to complete cognitive function screening on the telephone.  Normal Cognitive Function Screening: N/A- pt couldn't complete due to her Schizophrenia   Immunization & Health Maintenance Record Immunization History  Administered Date(s) Administered  . Influenza,inj,Quad PF,6+ Mos 12/30/2015, 12/30/2016, 03/14/2018, 02/20/2019  . Influenza-Unspecified 01/12/2015    Health Maintenance  Topic Date Due  .  MAMMOGRAM  05/16/2019  . PAP SMEAR-Modifier  05/23/2021  . TETANUS/TDAP  06/30/2026  . INFLUENZA VACCINE  Completed  . HIV Screening  Completed       Assessment  This is a routine wellness examination for Margaret [name].  Health Maintenance: Due or Overdue There are no preventive care reminders to display for this patient.  Margaret [name] does not need a referral for  Community Assistance: Care Management:   no Social Work:    no Prescription Assistance:  no Nutrition/Diabetes Education:  no   Plan:  Personalized Goals Goals Addressed            This Visit's Progress   . DIET - INCREASE WATER INTAKE       Try to drink 6-8 glasses of water daily      Personalized Health Maintenance & Screening Recommendations  Td vaccine  Lung Cancer Screening Recommended: no (Low Dose CT Chest recommended if Age 5-80 years, 30 pack-year currently smoking OR have quit w/in past 15 years) Hepatitis C Screening recommended: no HIV Screening recommended: no  Advanced Directives: Written information was not prepared per patient's request.  Referrals & Orders No orders of the defined types were placed in this encounter.   Follow-up Plan . Follow-up with Dettinger, Fransisca Kaufmann, MD as planned . Schedule your Screening Mammogram as discussed after 05/16/19 . Consider TDAP vaccine at your next visit   I have personally reviewed and noted the following in the patient's chart:   . Medical and social history . Use of alcohol, tobacco or illicit drugs  . Current medications and supplements . Functional ability and status . Nutritional status . Physical activity . Advanced directives . List of other physicians . Hospitalizations, surgeries, and ER visits in previous 12 months . Vitals . Screenings to include cognitive, depression, and falls . Referrals and appointments  In addition, I have reviewed and discussed with Margaret [name] certain preventive protocols, quality metrics, and best  practice recommendations. A written personalized care plan for preventive services as well as general preventive health recommendations is available and can be mailed to the patient at her request.      Milas Hock, LPN  06/21/2332

## 2019-04-30 ENCOUNTER — Other Ambulatory Visit: Payer: Self-pay | Admitting: Family Medicine

## 2019-05-02 ENCOUNTER — Ambulatory Visit (INDEPENDENT_AMBULATORY_CARE_PROVIDER_SITE_OTHER): Payer: Medicare Other | Admitting: Internal Medicine

## 2019-05-07 ENCOUNTER — Encounter (INDEPENDENT_AMBULATORY_CARE_PROVIDER_SITE_OTHER): Payer: Self-pay | Admitting: Internal Medicine

## 2019-05-07 ENCOUNTER — Ambulatory Visit (INDEPENDENT_AMBULATORY_CARE_PROVIDER_SITE_OTHER): Payer: Medicare Other | Admitting: Internal Medicine

## 2019-05-07 ENCOUNTER — Other Ambulatory Visit: Payer: Self-pay

## 2019-05-07 VITALS — BP 111/75 | HR 108 | Temp 97.5°F | Ht 63.0 in | Wt 120.1 lb

## 2019-05-07 DIAGNOSIS — R634 Abnormal weight loss: Secondary | ICD-10-CM

## 2019-05-07 DIAGNOSIS — B181 Chronic viral hepatitis B without delta-agent: Secondary | ICD-10-CM | POA: Diagnosis not present

## 2019-05-07 NOTE — Progress Notes (Signed)
Presenting complaint;  History of chronic hepatitis B. Follow-up for weight loss.  Database and subjective:  Patient is 49 year old female who has completed 4 years of entecavir for chronic hepatitis B and HBV DNA levels have remained undetectable but she has not seroconverted.  Hepatitis B surface antigen remains positive. She was last seen on 03/19/2019 and noted to have lost 8 pounds. I felt her weight loss was because of excessive physical activity.  TSH level was normal.  She returns for scheduled visit.  She is accompanied by Ms. [place] Wilson of [facility]. Patient brings along food diary from 04/24/2019 through 04/30/2019. Review of food diary reveals that she eats good breakfast and small lunch and supper and does not eat snacks every time she is offered.  According to Ms. Redmond Pulling lately she has been doing less physical activity and spending more time in her room. She has lost another 8 pounds since her last visit.  Ms. Redmond Pulling recalls that when she came to the facility few years ago she was close to 160 pounds. Patient denies abdominal pain nausea vomiting melena or rectal bleeding.  She states her bowels move regularly.  She denies fever chills or night sweats.  She also denies cough or chest pain.  She takes ibuprofen no more than twice a month.  Current Medications: Outpatient Encounter Medications as of 05/07/2019  Medication Sig  . acetaminophen (TYLENOL) 650 MG CR tablet Take 650 mg by mouth every 8 (eight) hours as needed for pain.  . chlorproMAZINE (THORAZINE) 50 MG tablet Take 50 mg by mouth as needed. One tablet by mouth PRN agitation, max of 2 tablets, 100 mg in 24 hours  . Cholecalciferol (VITAMIN D3) 50 MCG (2000 UT) capsule TAKE 1 CAPSULE BY MOUTH EVERY MORNING.  . cloZAPine (CLOZARIL) 100 MG tablet Take 250 mg by mouth at bedtime.  . cloZAPine (CLOZARIL) 100 MG tablet Take 150 mg by mouth every morning.   . entecavir (BARACLUDE) 0.5 MG tablet TAKE (1) TABLET BY  MOUTH ONCE DAILY.  Marland Kitchen Fish Oil-Cholecalciferol (FISH OIL + D3) 1200-1000 MG-UNIT CAPS TAKE 1 CAPSULE BY MOUTH TWICE DAILY.  Marland Kitchen FLUoxetine (PROZAC) 20 MG capsule TAKE 3 CAPSULES BY MOUTH ONCE DAILY.  . fluticasone (FLONASE) 50 MCG/ACT nasal spray SPRAY 2 SPRAYS IN RIGHT NOSTRIL ONLY TWICE DAILY.  Marland Kitchen ibuprofen (ADVIL) 600 MG tablet TAKE 1 TABLET BY MOUTH WITH FOOD 4 TIMES DAILY AS NEEDED FOR ACTIVE MODERATE PAIN UNRELIEVED BY ACETAMINOPHEN. MAX 2 DOSES/24 HR.  Marland Kitchen levonorgestrel (MIRENA) 20 MCG/24HR IUD 1 each by Intrauterine route once.  . Multiple Vitamins-Minerals (CENTROVITE) TABS TAKE 1 TABLET BY MOUTH AT BEDTIME.  . Skin Protectants, Misc. (MINERIN) CREA APPLY MODERATE AMOUNT TOPICALLY TO DRY SKIN ON SOLES & HEELS OF BOTH FEET AFTER SHOWER. (NURSING STAFF TO SUPERVISE APPLICATION)  . VENTOLIN HFA 108 (90 Base) MCG/ACT inhaler INHALE 2 PUFFS EVERY 4 HOURS AS NEEDED FOR WHEEZING.   No facility-administered encounter medications on file as of 05/07/2019.     Objective: Blood pressure 111/75, pulse (!) 108, temperature (!) 97.5 F (36.4 C), temperature source Temporal, height 5' 3"  (1.6 m), weight 120 lb 1.6 oz (54.5 kg). Patient is alert and in no acute distress. She is thin. She is wearing facial mask. Conjunctiva is pink. Sclera is nonicteric Oropharyngeal mucosa is normal. No neck masses or thyromegaly noted. Cardiac exam with regular rhythm normal S1 and S2. No murmur or gallop noted. Lungs are clear to auscultation. Abdomen is flat soft and nontender  with organomegaly or masses. No LE edema or clubbing noted.  Labs/studies Results:  CBC Latest Ref Rng & Units 03/21/2019 09/18/2018 03/14/2018  WBC 3.8 - 10.8 Thousand/uL 6.8 7.6 8.2  Hemoglobin 11.7 - 15.5 g/dL 11.8 12.1 12.2  Hematocrit 35.0 - 45.0 % 35.6 37.4 36.3  Platelets 140 - 400 Thousand/uL 292 218 274    CMP Latest Ref Rng & Units 03/21/2019 09/18/2018 03/14/2018  Glucose 65 - 139 mg/dL 81 85 86  BUN 7 - 25 mg/dL 9 8 7    Creatinine 0.50 - 1.10 mg/dL 0.67 0.70 0.74  Sodium 135 - 146 mmol/L 140 139 144  Potassium 3.5 - 5.3 mmol/L 3.8 4.0 4.4  Chloride 98 - 110 mmol/L 104 104 105  CO2 20 - 32 mmol/L 28 29 26   Calcium 8.6 - 10.2 mg/dL 9.1 9.3 9.1  Total Protein 6.1 - 8.1 g/dL 6.2 6.1 6.0  Total Bilirubin 0.2 - 1.2 mg/dL 0.5 0.7 0.3  Alkaline Phos 39 - 117 IU/L - - 88  AST 10 - 35 U/L 10 14 17   ALT 6 - 29 U/L 10 11 13     Hepatic Function Latest Ref Rng & Units 03/21/2019 09/18/2018 03/14/2018  Total Protein 6.1 - 8.1 g/dL 6.2 6.1 6.0  Albumin 3.5 - 5.5 g/dL - - 3.8  AST 10 - 35 U/L 10 14 17   ALT 6 - 29 U/L 10 11 13   Alk Phosphatase 39 - 117 IU/L - - 88  Total Bilirubin 0.2 - 1.2 mg/dL 0.5 0.7 0.3  Bilirubin, Direct 0.1 - 0.5 mg/dL - - -    TSH 1.87  HBsAg reactive.   Assessment:  #1.  Weight loss.  Patient has lost 8 pounds in the last 7 weeks and she has lost 16 pounds June 2020.  She does not have any constitutional symptoms.  She has remote history of pulmonary tuberculosis.  She does not have any symptoms to suggest relapse.  Review of food diary reveals poor oral intake.  She would definitely benefit from eating snacks between meals if if she was to eat snacks.  She may also have elevated BMI.  Decrease in her diet possibly related to her psychiatric medications. I do not believe weight loss has anything to do with underlying chronic hepatitis B or entecavir.  However patients with chronic B are at risk for [facility] even before the develop cirrhosis.  Her risk is deemed to be quite low because viral activity is effectively suppressed with therapy.  #2.  Chronic hepatitis B.  She had F2/F3 disease based on elastography of August 2016.  Her transaminases are normal.  HBV DNA remains undetectable.  However she has not cleared the virus as is evident by repeatedly positive hepatitis B surface antigen.  Plan:  Abdominal ultrasound. I encouraged patient to eat snack between meals.  Since she is not diabetic  and does not have cardiac issues she could be serve milkshake and ice cream as snacks. Weight check in 1 month. Office visit on 09/24/2019 as planned

## 2019-05-07 NOTE — Patient Instructions (Addendum)
Abdominal ultrasound to be scheduled. Weight check in 1 month. Please encourage patient to eat snacks between meals as discussed.

## 2019-05-10 ENCOUNTER — Other Ambulatory Visit: Payer: Self-pay

## 2019-05-10 ENCOUNTER — Ambulatory Visit (HOSPITAL_COMMUNITY)
Admission: RE | Admit: 2019-05-10 | Discharge: 2019-05-10 | Disposition: A | Discharge status: Home / Self Care | Payer: Medicare Other | Source: Ambulatory Visit | Attending: Internal Medicine | Admitting: Internal Medicine

## 2019-05-10 DIAGNOSIS — K8020 Calculus of gallbladder without cholecystitis without obstruction: Secondary | ICD-10-CM | POA: Diagnosis not present

## 2019-05-10 DIAGNOSIS — R634 Abnormal weight loss: Secondary | ICD-10-CM | POA: Diagnosis not present

## 2019-05-10 DIAGNOSIS — B181 Chronic viral hepatitis B without delta-agent: Secondary | ICD-10-CM | POA: Insufficient documentation

## 2019-05-10 IMAGING — US US ABDOMEN COMPLETE
1 series · 13 of 25 positions shown · non-contrast
Comparison: [DATE]

CLINICAL DATA: Chronic hepatitis-B, weight loss

EXAM:
ABDOMEN ULTRASOUND COMPLETE

[Series 1: us abdomen complete · 13 of 118 slices shown]
[im 1/118]
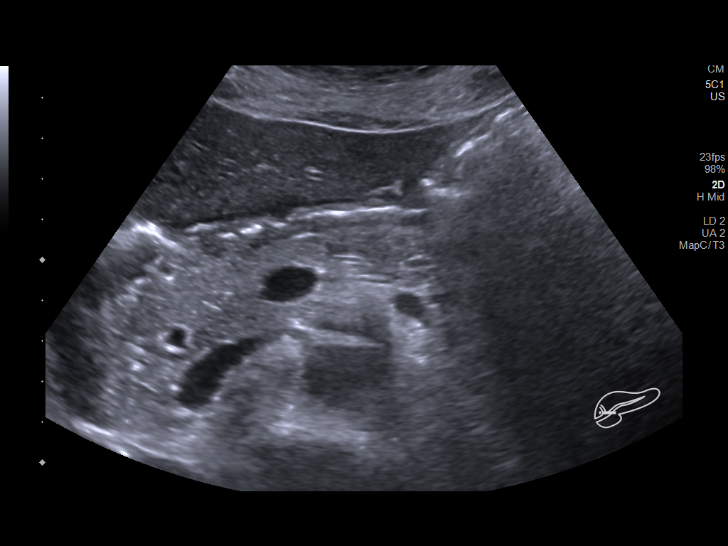
[im 10/118]
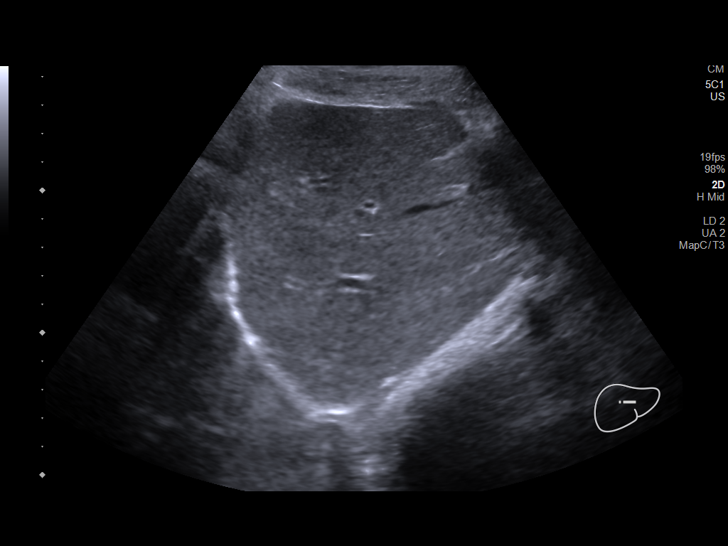
[im 20/118]
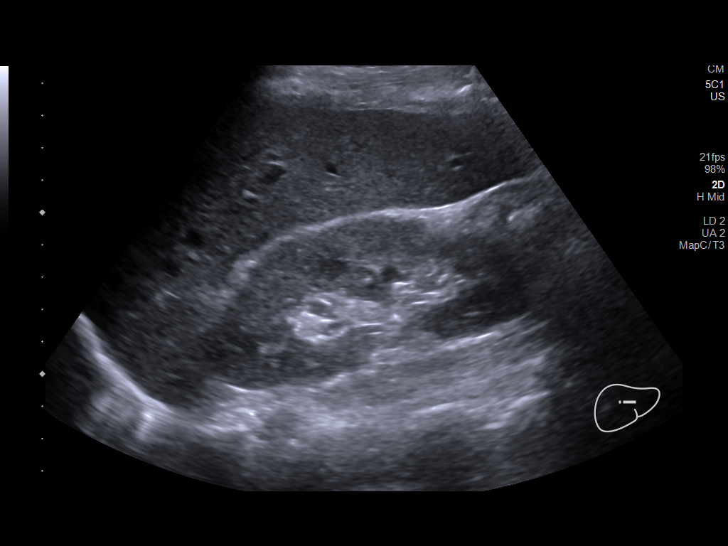
[im 30/118]
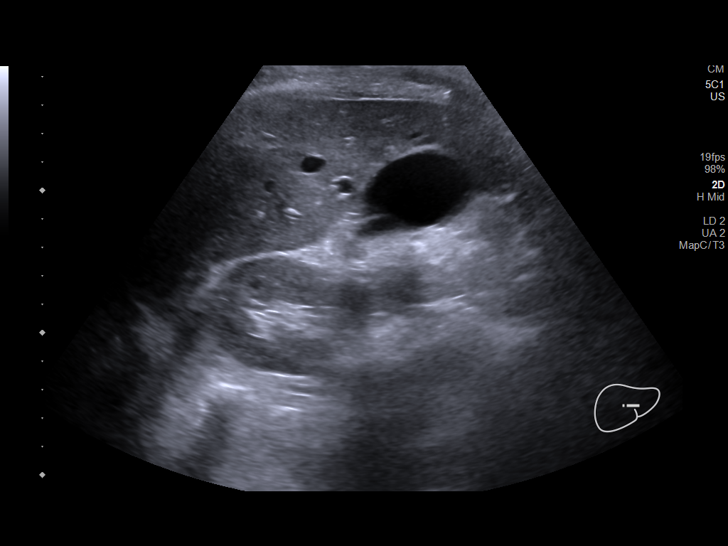
[im 40/118]
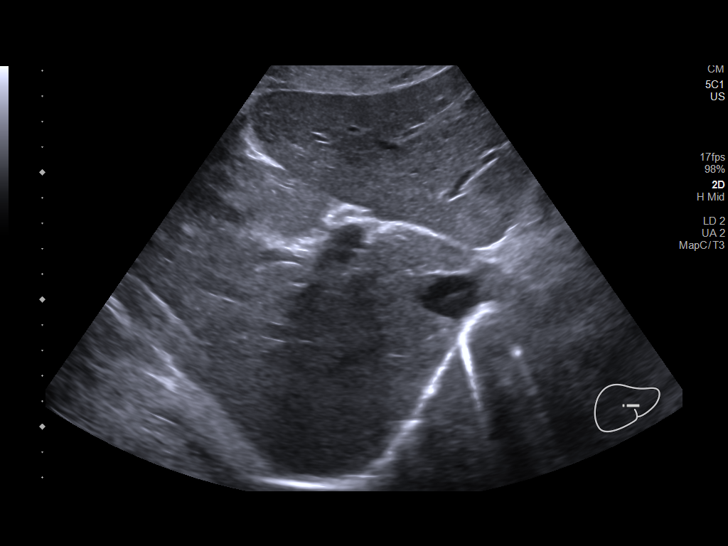
[im 49/118]
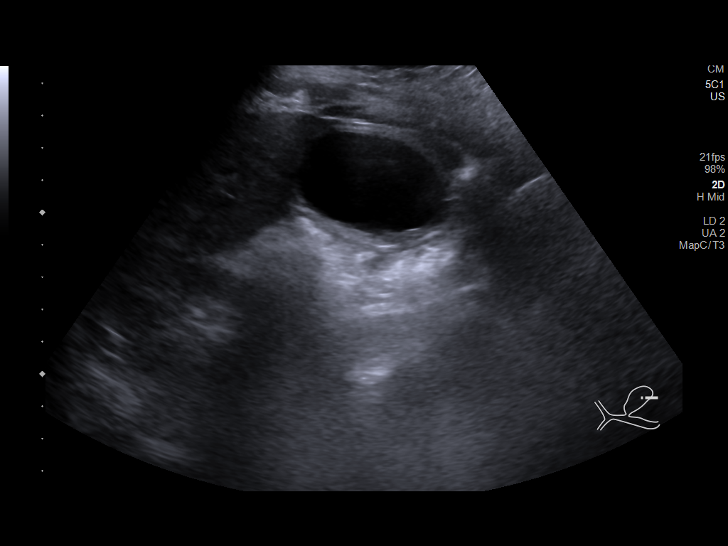
[im 59/118]
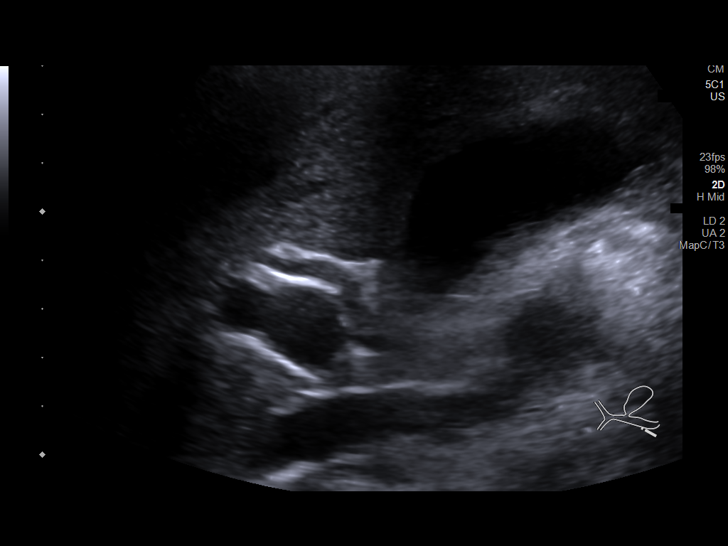
[im 69/118]
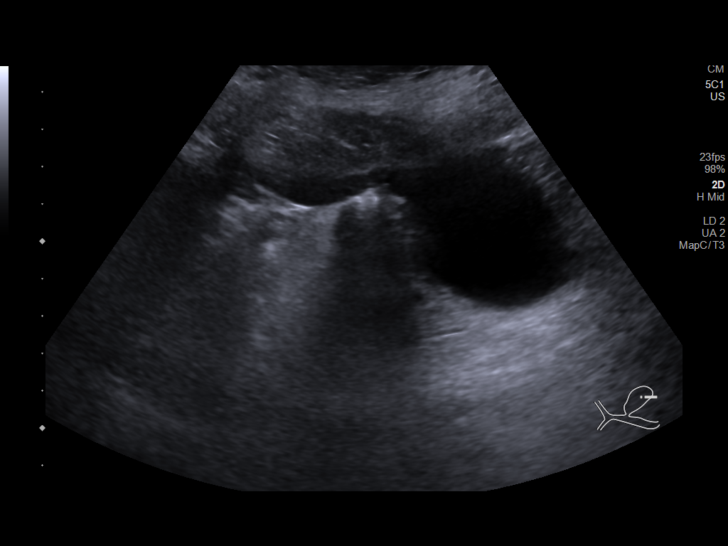
[im 79/118]
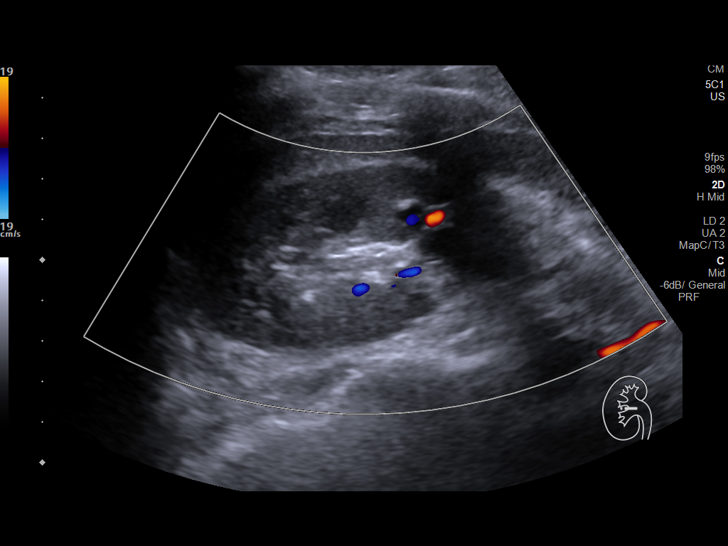
[im 88/118]
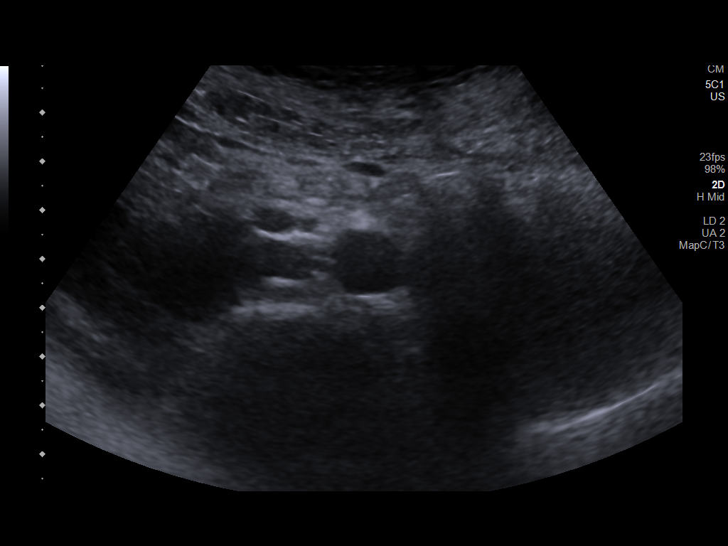
[im 98/118]
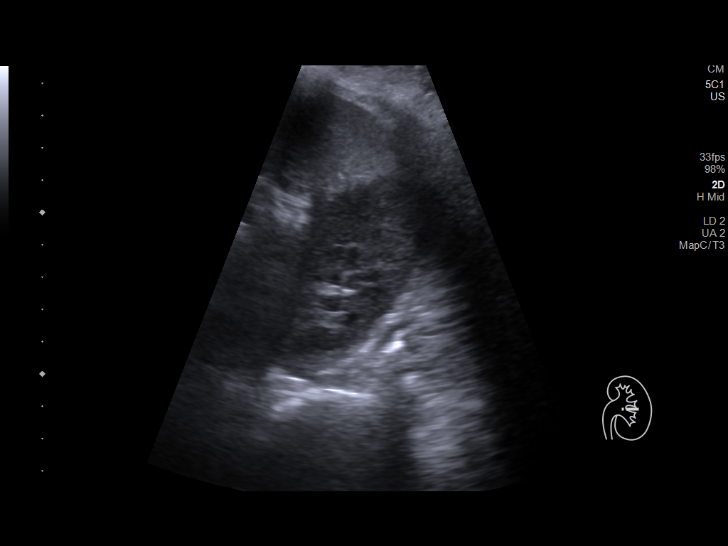
[im 108/118]
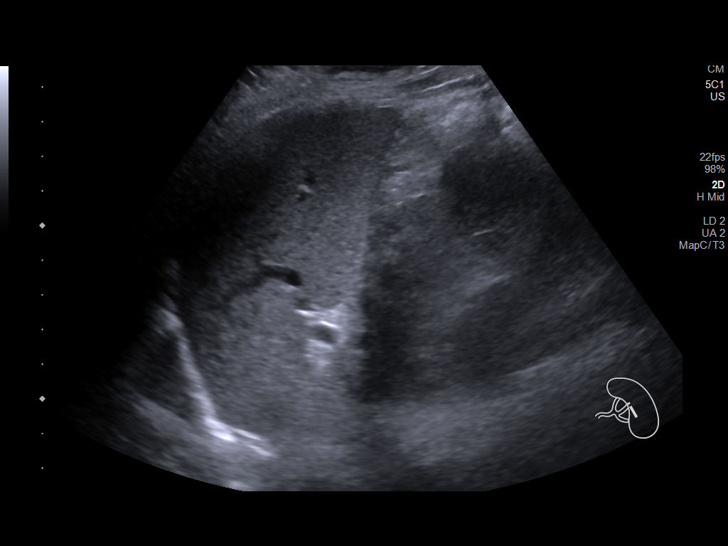
[im 118/118]
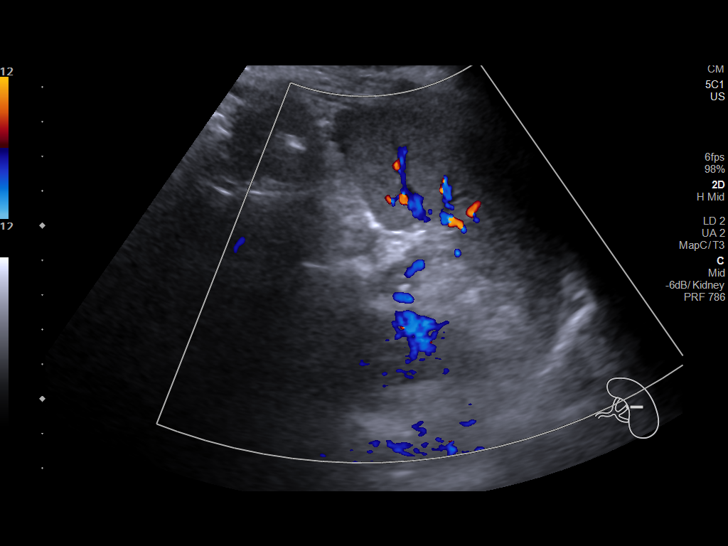

[13 of 25 positions shown; findings below may reference images not displayed]

FINDINGS: Gallbladder: There is a 2.4 cm non mobile shadowing gallstone within
the gallbladder neck. Although gallbladder is moderately distended,
I do not see any gallbladder wall thickening or pericholecystic
fluid to suggest acute cholecystitis.

Common bile duct: Diameter: 4 mm. There is no evidence of
intrahepatic biliary duct dilation.

Liver: No focal lesion identified. Within normal limits in
parenchymal echogenicity. Portal vein is patent on color Doppler
imaging with normal direction of blood flow towards the liver.

IVC: No abnormality visualized.

Pancreas: Visualized portion unremarkable.

Spleen: Size and appearance within normal limits.

Right Kidney: Length: 11.3. Echogenicity within normal limits. No
mass or hydronephrosis visualized. Mild pelviectasis.

Left Kidney: Length: 10.1. Echogenicity within normal limits. No
mass or hydronephrosis visualized. Mild pelviectasis.

Abdominal aorta: No aneurysm visualized.

Other findings: None.
IMPRESSION: 1. 2.4 cm gallstone within the gallbladder neck. Wall there is
moderate distention of the gallbladder, there are no other
sonographic signs of acute cholecystitis.
2. Mild bilateral renal pelviectasis without frank hydronephrosis or
nephrolithiasis. Nonspecific.
3. Otherwise unremarkable exam.

## 2019-05-14 DIAGNOSIS — Z1231 Encounter for screening mammogram for malignant neoplasm of breast: Secondary | ICD-10-CM | POA: Diagnosis not present

## 2019-05-20 ENCOUNTER — Other Ambulatory Visit: Payer: Self-pay | Admitting: Family Medicine

## 2019-06-06 ENCOUNTER — Other Ambulatory Visit: Payer: Self-pay

## 2019-06-06 ENCOUNTER — Other Ambulatory Visit: Payer: Medicare Other

## 2019-06-06 DIAGNOSIS — Z79899 Other long term (current) drug therapy: Secondary | ICD-10-CM | POA: Diagnosis not present

## 2019-06-10 DIAGNOSIS — Z23 Encounter for immunization: Secondary | ICD-10-CM | POA: Diagnosis not present

## 2019-06-13 DIAGNOSIS — F250 Schizoaffective disorder, bipolar type: Secondary | ICD-10-CM | POA: Diagnosis not present

## 2019-06-20 ENCOUNTER — Other Ambulatory Visit: Payer: Self-pay | Admitting: Family Medicine

## 2019-06-28 ENCOUNTER — Other Ambulatory Visit: Payer: Self-pay

## 2019-06-28 ENCOUNTER — Other Ambulatory Visit: Payer: Medicare Other

## 2019-06-28 DIAGNOSIS — Z79899 Other long term (current) drug therapy: Secondary | ICD-10-CM | POA: Diagnosis not present

## 2019-07-02 DIAGNOSIS — F71 Moderate intellectual disabilities: Secondary | ICD-10-CM | POA: Diagnosis not present

## 2019-07-05 DIAGNOSIS — F71 Moderate intellectual disabilities: Secondary | ICD-10-CM | POA: Diagnosis not present

## 2019-07-06 DIAGNOSIS — F71 Moderate intellectual disabilities: Secondary | ICD-10-CM | POA: Diagnosis not present

## 2019-07-09 DIAGNOSIS — Z23 Encounter for immunization: Secondary | ICD-10-CM | POA: Diagnosis not present

## 2019-07-10 DIAGNOSIS — F250 Schizoaffective disorder, bipolar type: Secondary | ICD-10-CM | POA: Diagnosis not present

## 2019-08-29 ENCOUNTER — Other Ambulatory Visit: Payer: Self-pay

## 2019-08-29 ENCOUNTER — Other Ambulatory Visit: Payer: Medicare Other

## 2019-08-29 DIAGNOSIS — Z79899 Other long term (current) drug therapy: Secondary | ICD-10-CM | POA: Diagnosis not present

## 2019-09-24 ENCOUNTER — Encounter (INDEPENDENT_AMBULATORY_CARE_PROVIDER_SITE_OTHER): Payer: Self-pay | Admitting: Internal Medicine

## 2019-09-24 ENCOUNTER — Ambulatory Visit (INDEPENDENT_AMBULATORY_CARE_PROVIDER_SITE_OTHER): Payer: Medicare Other | Admitting: Internal Medicine

## 2019-09-24 ENCOUNTER — Other Ambulatory Visit: Payer: Self-pay

## 2019-09-24 VITALS — BP 107/78 | HR 91 | Temp 98.6°F | Resp 22 | Ht 64.0 in | Wt 126.8 lb

## 2019-09-24 DIAGNOSIS — B181 Chronic viral hepatitis B without delta-agent: Secondary | ICD-10-CM | POA: Diagnosis not present

## 2019-09-24 DIAGNOSIS — R634 Abnormal weight loss: Secondary | ICD-10-CM | POA: Diagnosis not present

## 2019-09-24 NOTE — Progress Notes (Signed)
Presenting complaint;  Follow-up for chronic hepatitis B and weight loss.  Database and subjective:  Patient is 49 year old Caucasian female who was chronic hepatitis B, F2/F3 disease based on elastography who has been on entecavir since January 2017 HBV RNA levels have remained undetected but she has not cleared antigen.  She has not experienced any side effects.  She was last seen on 05/07/2019 and noted to have lost weight.  Patient was encouraged to eat snack between meals and perhaps do less walking and exercise. She is here for scheduled visit accompanied by member of the staff. She has no complaints.  She walks 30 to 45 minutes every day.  She says her appetite is normal.  She denies nausea vomiting heartburn dysphagia abdominal pain melena or rectal bleeding.  Her bowels move daily.  Family history is negative for CRC. She has gained 6 pounds since her last visit over 4 months ago. She rarely takes Tylenol Thorazine or Advil.  She may use inhaler couple of times a week.  Current Medications: Outpatient Encounter Medications as of 09/24/2019  Medication Sig  . acetaminophen (TYLENOL) 650 MG CR tablet Take 650 mg by mouth every 8 (eight) hours as needed for pain.  . chlorproMAZINE (THORAZINE) 50 MG tablet Take 50 mg by mouth as needed. One tablet by mouth PRN agitation, max of 2 tablets, 100 mg in 24 hours  . Cholecalciferol (VITAMIN D3) 50 MCG (2000 UT) capsule TAKE 1 CAPSULE BY MOUTH EVERY MORNING.  . cloZAPine (CLOZARIL) 100 MG tablet Take 250 mg by mouth at bedtime.  . cloZAPine (CLOZARIL) 100 MG tablet Take 150 mg by mouth every morning.   . entecavir (BARACLUDE) 0.5 MG tablet TAKE (1) TABLET BY MOUTH ONCE DAILY.  Marland Kitchen Fish Oil-Cholecalciferol (FISH OIL + D3) 1200-1000 MG-UNIT CAPS TAKE 1 CAPSULE BY MOUTH TWICE DAILY.  Marland Kitchen FLUoxetine (PROZAC) 20 MG capsule TAKE 3 CAPSULES BY MOUTH ONCE DAILY.  . fluticasone (FLONASE) 50 MCG/ACT nasal spray SPRAY 2 SPRAYS IN RIGHT NOSTRIL ONLY TWICE DAILY.   Marland Kitchen ibuprofen (ADVIL) 600 MG tablet TAKE 1 TABLET BY MOUTH WITH FOOD 4 TIMES DAILY AS NEEDED FOR ACTIVE MODERATE PAIN UNRELIEVED BY ACETAMINOPHEN. MAX 2 DOSES/24 HR.  Marland Kitchen levonorgestrel (MIRENA) 20 MCG/24HR IUD 1 each by Intrauterine route once.  . Multiple Vitamins-Minerals (CENTROVITE) TABS TAKE 1 TABLET BY MOUTH AT BEDTIME.  . Skin Protectants, Misc. (MINERIN CREME) CREA APPLY MODERATE AMOUNT TOPICALLY TO DRY SKIN ON SOLES & HEELS OF BOTH FEET AFTER SHOWER. (NURSING STAFF TO SUPERVISE APPLICATION)  . VENTOLIN HFA 108 (90 Base) MCG/ACT inhaler INHALE 2 PUFFS EVERY 4 HOURS AS NEEDED FOR WHEEZING.   No facility-administered encounter medications on file as of 09/24/2019.     Objective: Blood pressure 107/78, pulse 91, temperature 98.6 F (37 C), temperature source Oral, resp. rate (!) 22, height 5' 4"  (1.626 m), weight 126 lb 12.8 oz (57.5 kg). Patient is alert and in no acute distress. She is wearing a mask. Conjunctiva is pink. Sclera is nonicteric Oropharyngeal mucosa is normal. No neck masses or thyromegaly noted. Cardiac exam with regular rhythm normal S1 and S2. No murmur or gallop noted. Lungs are clear to auscultation. Abdomen abdomen is flat but soft and nontender without hepatosplenomegaly or masses. No LE edema or clubbing noted.  Labs/studies Results:  CBC Latest Ref Rng & Units 03/21/2019 09/18/2018 03/14/2018  WBC 3.8 - 10.8 Thousand/uL 6.8 7.6 8.2  Hemoglobin 11.7 - 15.5 g/dL 11.8 12.1 12.2  Hematocrit 35 - 45 % 35.6  37.4 36.3  Platelets 140 - 400 Thousand/uL 292 218 274    CMP Latest Ref Rng & Units 03/21/2019 09/18/2018 03/14/2018  Glucose 65 - 139 mg/dL 81 85 86  BUN 7 - 25 mg/dL 9 8 7   Creatinine 0.50 - 1.10 mg/dL 0.67 0.70 0.74  Sodium 135 - 146 mmol/L 140 139 144  Potassium 3.5 - 5.3 mmol/L 3.8 4.0 4.4  Chloride 98 - 110 mmol/L 104 104 105  CO2 20 - 32 mmol/L 28 29 26   Calcium 8.6 - 10.2 mg/dL 9.1 9.3 9.1  Total Protein 6.1 - 8.1 g/dL 6.2 6.1 6.0  Total  Bilirubin 0.2 - 1.2 mg/dL 0.5 0.7 0.3  Alkaline Phos 39 - 117 IU/L - - 88  AST 10 - 35 U/L 10 14 17   ALT 6 - 29 U/L 10 11 13     Hepatic Function Latest Ref Rng & Units 03/21/2019 09/18/2018 03/14/2018  Total Protein 6.1 - 8.1 g/dL 6.2 6.1 6.0  Albumin 3.5 - 5.5 g/dL - - 3.8  AST 10 - 35 U/L 10 14 17   ALT 6 - 29 U/L 10 11 13   Alk Phosphatase 39 - 117 IU/L - - 88  Total Bilirubin 0.2 - 1.2 mg/dL 0.5 0.7 0.3  Bilirubin, Direct 0.1 - 0.5 mg/dL - - -    Miscellaneous lab data from 03/21/2019.  Hepatitis B surface antigen positive HBV DNA by PCR negative. TSH was 1.87 Assessment:  #1.  Chronic hepatitis B.  She has completed 5 years of antiviral therapy with entecavir.  HBV DNA by PCR has remained undetectable but hepatitis B surface antigen has not cleared.  Therefore it would be reasonable to continue therapy for foreseeable future unless she develops resistance or side effects with therapy.  #2.  History of weight loss.  She has gained 6 pounds in the last visit.  TSH back in December 2020 was normal.  #3.  Patient is average risk for CRC.  Would offer screening test at age 4.  Plan:  Patient will go to the lab for CBC, comprehensive chemistry panel, HBV DNA by PCR and hepatitis B surface antigen. Continue entecavir at current dose of 0.5 mg p.o. daily. Office visit in 6 months.

## 2019-09-24 NOTE — Patient Instructions (Signed)
Physician will call with results of blood work

## 2019-09-26 ENCOUNTER — Other Ambulatory Visit (INDEPENDENT_AMBULATORY_CARE_PROVIDER_SITE_OTHER): Payer: Self-pay | Admitting: *Deleted

## 2019-09-26 ENCOUNTER — Other Ambulatory Visit: Payer: Self-pay

## 2019-09-26 ENCOUNTER — Other Ambulatory Visit: Payer: Medicare Other

## 2019-09-26 DIAGNOSIS — R634 Abnormal weight loss: Secondary | ICD-10-CM

## 2019-09-26 DIAGNOSIS — B181 Chronic viral hepatitis B without delta-agent: Secondary | ICD-10-CM

## 2019-10-01 LAB — COMPREHENSIVE METABOLIC PANEL
ALT: 15 IU/L (ref 0–32)
AST: 17 IU/L (ref 0–40)
Albumin/Globulin Ratio: 2.2 (ref 1.2–2.2)
Albumin: 4.4 g/dL (ref 3.8–4.8)
Alkaline Phosphatase: 78 IU/L (ref 48–121)
BUN/Creatinine Ratio: 15 (ref 9–23)
BUN: 11 mg/dL (ref 6–24)
Bilirubin Total: 0.4 mg/dL (ref 0.0–1.2)
CO2: 26 mmol/L (ref 20–29)
Calcium: 9.2 mg/dL (ref 8.7–10.2)
Chloride: 107 mmol/L — ABNORMAL HIGH (ref 96–106)
Creatinine, Ser: 0.75 mg/dL (ref 0.57–1.00)
GFR calc Af Amer: 110 mL/min/{1.73_m2} (ref >59)
GFR calc non Af Amer: 95 mL/min/{1.73_m2} (ref >59)
Globulin, Total: 2 g/dL (ref 1.5–4.5)
Glucose: 81 mg/dL (ref 65–99)
Potassium: 3.9 mmol/L (ref 3.5–5.2)
Sodium: 146 mmol/L — ABNORMAL HIGH (ref 134–144)
Total Protein: 6.4 g/dL (ref 6.0–8.5)

## 2019-10-01 LAB — CBC
Hematocrit: 38.1 % (ref 34.0–46.6)
Hemoglobin: 12.5 g/dL (ref 11.1–15.9)
MCH: 30.4 pg (ref 26.6–33.0)
MCHC: 32.8 g/dL (ref 31.5–35.7)
MCV: 93 fL (ref 79–97)
Platelets: 167 10*3/uL (ref 150–450)
RBC: 4.11 x10E6/uL (ref 3.77–5.28)
RDW: 12.2 % (ref 11.7–15.4)
WBC: 7.5 10*3/uL (ref 3.4–10.8)

## 2019-10-01 LAB — HEPATITIS B DNA, ULTRAQUANTITATIVE, PCR: HBV DNA SERPL PCR-ACNC: NOT DETECTED IU/mL

## 2019-10-01 LAB — HEPATITIS B SURFACE AG, CONFIRM: HBsAG Confirmation: POSITIVE — AB

## 2019-10-01 LAB — HEPATITIS B SURFACE ANTIGEN

## 2019-10-04 ENCOUNTER — Other Ambulatory Visit: Payer: Self-pay | Admitting: Family Medicine

## 2019-11-01 ENCOUNTER — Other Ambulatory Visit: Payer: Self-pay

## 2019-11-01 ENCOUNTER — Other Ambulatory Visit: Payer: Medicare Other

## 2019-11-01 DIAGNOSIS — Z79899 Other long term (current) drug therapy: Secondary | ICD-10-CM | POA: Diagnosis not present

## 2019-11-27 ENCOUNTER — Other Ambulatory Visit: Payer: Medicare Other

## 2019-11-27 ENCOUNTER — Other Ambulatory Visit: Payer: Self-pay

## 2019-11-27 DIAGNOSIS — F250 Schizoaffective disorder, bipolar type: Secondary | ICD-10-CM | POA: Diagnosis not present

## 2019-11-27 DIAGNOSIS — Z79899 Other long term (current) drug therapy: Secondary | ICD-10-CM | POA: Diagnosis not present

## 2020-01-03 ENCOUNTER — Other Ambulatory Visit: Payer: Self-pay | Admitting: Family Medicine

## 2020-01-06 ENCOUNTER — Other Ambulatory Visit: Payer: Self-pay | Admitting: Family Medicine

## 2020-01-08 ENCOUNTER — Other Ambulatory Visit: Payer: Self-pay

## 2020-01-08 ENCOUNTER — Ambulatory Visit (INDEPENDENT_AMBULATORY_CARE_PROVIDER_SITE_OTHER): Payer: Medicare Other | Admitting: Family Medicine

## 2020-01-08 ENCOUNTER — Encounter: Payer: Self-pay | Admitting: Family Medicine

## 2020-01-08 VITALS — BP 112/81 | HR 96 | Temp 98.0°F | Ht 64.0 in | Wt 128.0 lb

## 2020-01-08 DIAGNOSIS — F201 Disorganized schizophrenia: Secondary | ICD-10-CM

## 2020-01-08 DIAGNOSIS — E782 Mixed hyperlipidemia: Secondary | ICD-10-CM | POA: Diagnosis not present

## 2020-01-08 DIAGNOSIS — F250 Schizoaffective disorder, bipolar type: Secondary | ICD-10-CM | POA: Diagnosis not present

## 2020-01-08 DIAGNOSIS — Z79899 Other long term (current) drug therapy: Secondary | ICD-10-CM | POA: Diagnosis not present

## 2020-01-08 MED ORDER — BENZTROPINE MESYLATE 1 MG PO TABS: 1.0000 mg | ORAL_TABLET | Freq: Two times a day (BID) | ORAL | 3 refills | Status: DC

## 2020-01-08 NOTE — Progress Notes (Signed)
BP 112/81   Pulse 96   Temp 98 F (36.7 C)   Ht 5' 4"  (1.626 m)   Wt 128 lb (58.1 kg)   SpO2 97%   BMI 21.97 kg/m    Subjective:   [name], female    DOB: 03-29-1971, 49 y.o.   MRN: [id number]  HPI: Margaret [name] is a 49 y.o. female presenting on 01/08/2020 for No chief complaint on file.   HPI Patient is here with her caretaker Merrilee Seashore says that she is having some more EPS symptoms in the hands and in the mouth and shaking and moving that they have seen in some of the other patients.  She is on antipsychotics because of schizophrenia.  He said it is been really increasing over the past few months but she has been having them for over a year.  Hyperlipidemia Patient is coming in for recheck of his hyperlipidemia. The patient is currently taking fish oil. They deny any issues with myalgias or history of liver damage from it. They deny any focal numbness or weakness or chest pain.   Relevant past medical, surgical, family and social history reviewed and updated as indicated. Interim medical history since our last visit reviewed. Allergies and medications reviewed and updated.  Review of Systems  Constitutional: Negative for chills and fever.  Eyes: Negative for redness and visual disturbance.  Respiratory: Negative for chest tightness and shortness of breath.   Cardiovascular: Negative for chest pain and leg swelling.  Genitourinary: Positive for frequency. Negative for difficulty urinating.  Musculoskeletal: Negative for back pain and gait problem.  Skin: Negative for rash.  Neurological: Positive for tremors. Negative for weakness, light-headedness and headaches.  Psychiatric/Behavioral: Positive for confusion, decreased concentration and dysphoric mood. Negative for agitation, behavioral problems, self-injury, sleep disturbance and suicidal ideas. The patient is not nervous/anxious.   All other systems reviewed and are negative.   Per HPI unless specifically  indicated above   Allergies as of 01/08/2020   No Known Allergies     Medication List       Accurate as of January 08, 2020 10:22 AM. If you have any questions, ask your nurse or doctor.        acetaminophen 650 MG CR tablet Commonly known as: TYLENOL Take 650 mg by mouth every 8 (eight) hours as needed for pain.   CENTROVITE Tabs TAKE 1 TABLET BY MOUTH AT BEDTIME.   chlorproMAZINE 50 MG tablet Commonly known as: THORAZINE Take 50 mg by mouth as needed. One tablet by mouth PRN agitation, max of 2 tablets, 100 mg in 24 hours   cloZAPine 100 MG tablet Commonly known as: CLOZARIL Take 250 mg by mouth at bedtime.   cloZAPine 100 MG tablet Commonly known as: CLOZARIL Take 150 mg by mouth every morning.   entecavir 0.5 MG tablet Commonly known as: BARACLUDE TAKE (1) TABLET BY MOUTH ONCE DAILY.   Fish Oil + D3 1200-1000 MG-UNIT Caps TAKE 1 CAPSULE BY MOUTH TWICE DAILY.   FLUoxetine 20 MG capsule Commonly known as: PROZAC TAKE 3 CAPSULES BY MOUTH ONCE DAILY.   fluticasone 50 MCG/ACT nasal spray Commonly known as: FLONASE SPRAY 2 SPRAYS IN RIGHT NOSTRIL ONLY TWICE DAILY.   ibuprofen 600 MG tablet Commonly known as: ADVIL TAKE 1 TABLET BY MOUTH WITH FOOD 4 TIMES DAILY AS NEEDED FOR ACTIVE MODERATE PAIN UNRELIEVED BY ACETAMINOPHEN. MAX 2 DOSES/24 HR.   levonorgestrel 20 MCG/24HR IUD Commonly known as: MIRENA 1 each by Intrauterine route  once.   Minerin Creme Crea APPLY MODERATE AMOUNT TOPICALLY TO DRY SKIN ON SOLES & HEELS OF BOTH FEET AFTER SHOWER. (NURSING STAFF TO SUPERVISE APPLICATION)   Ventolin HFA 108 (90 Base) MCG/ACT inhaler Generic drug: albuterol INHALE 2 PUFFS EVERY 4 HOURS AS NEEDED FOR WHEEZING.   Vitamin D3 50 MCG (2000 UT) capsule TAKE 1 CAPSULE BY MOUTH EVERY MORNING.        Objective:   BP 112/81   Pulse 96   Temp 98 F (36.7 C)   Ht 5' 4"  (1.626 m)   Wt 128 lb (58.1 kg)   SpO2 97%   BMI 21.97 kg/m   Wt Readings from Last 3  Encounters:  01/08/20 128 lb (58.1 kg)  09/24/19 126 lb 12.8 oz (57.5 kg)  05/07/19 120 lb 1.6 oz (54.5 kg)    Physical Exam Vitals and nursing note reviewed.  Constitutional:      General: She is not in acute distress.    Appearance: She is well-developed. She is not diaphoretic.  Eyes:     Conjunctiva/sclera: Conjunctivae normal.  Cardiovascular:     Rate and Rhythm: Normal rate and regular rhythm.     Heart sounds: Normal heart sounds. No murmur heard.   Pulmonary:     Effort: Pulmonary effort is normal. No respiratory distress.     Breath sounds: Normal breath sounds. No wheezing.  Musculoskeletal:        General: No tenderness. Normal range of motion.  Skin:    General: Skin is warm and dry.     Findings: No rash.  Neurological:     Mental Status: She is alert and oriented to person, place, and time.     Coordination: Coordination normal.  Psychiatric:        Behavior: Behavior normal.       Assessment & Plan:   Problem List Items Addressed This Visit      Other   Hyperlipidemia - Primary   Relevant Orders   CMP14+EGFR   Lipid panel   Schizophrenia ([place])      Patient is starting to get some EPS symptoms from her medicine for schizophrenia, will send Cogentin for her.  Will check blood work and follow-up in 6 months for physical. Follow up plan: Return in about 6 months (around 07/08/2020), or if symptoms worsen or fail to improve, for Well woman exam and physical..  Counseling provided for all of the vaccine components No orders of the defined types were placed in this encounter.   Caryl Pina, MD [facility] 01/08/2020, 10:22 AM

## 2020-01-10 LAB — LIPID PANEL
Chol/HDL Ratio: 2.9 ratio (ref 0.0–4.4)
Cholesterol, Total: 169 mg/dL (ref 100–199)
HDL: 59 mg/dL (ref >39)
LDL Chol Calc (NIH): 94 mg/dL (ref 0–99)
Triglycerides: 88 mg/dL (ref 0–149)
VLDL Cholesterol Cal: 16 mg/dL (ref 5–40)

## 2020-01-10 LAB — CMP14+EGFR
ALT: 18 IU/L (ref 0–32)
AST: 19 IU/L (ref 0–40)
Albumin/Globulin Ratio: 1.9 (ref 1.2–2.2)
Albumin: 4.2 g/dL (ref 3.8–4.8)
Alkaline Phosphatase: 85 IU/L (ref 44–121)
BUN/Creatinine Ratio: 13 (ref 9–23)
BUN: 10 mg/dL (ref 6–24)
Bilirubin Total: 0.3 mg/dL (ref 0.0–1.2)
CO2: 23 mmol/L (ref 20–29)
Calcium: 9.7 mg/dL (ref 8.7–10.2)
Chloride: 104 mmol/L (ref 96–106)
Creatinine, Ser: 0.8 mg/dL (ref 0.57–1.00)
GFR calc Af Amer: 102 mL/min/{1.73_m2} (ref >59)
GFR calc non Af Amer: 88 mL/min/{1.73_m2} (ref >59)
Globulin, Total: 2.2 g/dL (ref 1.5–4.5)
Glucose: 109 mg/dL — ABNORMAL HIGH (ref 65–99)
Potassium: 4.2 mmol/L (ref 3.5–5.2)
Sodium: 144 mmol/L (ref 134–144)
Total Protein: 6.4 g/dL (ref 6.0–8.5)

## 2020-01-28 DIAGNOSIS — F250 Schizoaffective disorder, bipolar type: Secondary | ICD-10-CM | POA: Diagnosis not present

## 2020-02-12 ENCOUNTER — Other Ambulatory Visit: Payer: Self-pay | Admitting: Family Medicine

## 2020-02-18 ENCOUNTER — Other Ambulatory Visit: Payer: Medicare Other

## 2020-02-18 ENCOUNTER — Other Ambulatory Visit: Payer: Self-pay

## 2020-02-18 DIAGNOSIS — Z79899 Other long term (current) drug therapy: Secondary | ICD-10-CM | POA: Diagnosis not present

## 2020-03-04 ENCOUNTER — Other Ambulatory Visit: Payer: Self-pay | Admitting: Family Medicine

## 2020-03-16 ENCOUNTER — Other Ambulatory Visit: Payer: Self-pay

## 2020-03-16 ENCOUNTER — Other Ambulatory Visit: Payer: Medicare Other

## 2020-03-16 DIAGNOSIS — Z79899 Other long term (current) drug therapy: Secondary | ICD-10-CM | POA: Diagnosis not present

## 2020-03-31 ENCOUNTER — Ambulatory Visit (INDEPENDENT_AMBULATORY_CARE_PROVIDER_SITE_OTHER): Payer: Medicare Other | Admitting: Internal Medicine

## 2020-04-09 ENCOUNTER — Other Ambulatory Visit: Payer: Medicare Other

## 2020-04-09 DIAGNOSIS — Z79899 Other long term (current) drug therapy: Secondary | ICD-10-CM | POA: Diagnosis not present

## 2020-05-04 ENCOUNTER — Other Ambulatory Visit: Payer: Self-pay | Admitting: Family Medicine

## 2020-05-12 ENCOUNTER — Telehealth: Payer: Self-pay | Admitting: *Deleted

## 2020-05-12 NOTE — Telephone Encounter (Signed)
Fax from [facility] RF request for Vit E 200 iu softgel #30 1 QD Not on current med list Last OV 01/08/20 Next OV 07/08/20

## 2020-05-13 MED ORDER — VITAMIN E 200 UNITS PO CAPS: 200.0000 [IU] | ORAL_CAPSULE | Freq: Every day | ORAL | 11 refills | Status: DC

## 2020-05-13 NOTE — Telephone Encounter (Signed)
I sent prescription of vitamin D to [facility].

## 2020-05-13 NOTE — Addendum Note (Signed)
Addended by: Caryl Pina on: 05/13/2020 12:59 PM   Modules accepted: Orders

## 2020-05-20 DIAGNOSIS — F250 Schizoaffective disorder, bipolar type: Secondary | ICD-10-CM | POA: Diagnosis not present

## 2020-06-10 ENCOUNTER — Other Ambulatory Visit: Payer: Self-pay | Admitting: Family Medicine

## 2020-06-30 ENCOUNTER — Other Ambulatory Visit: Payer: Self-pay

## 2020-06-30 ENCOUNTER — Ambulatory Visit (INDEPENDENT_AMBULATORY_CARE_PROVIDER_SITE_OTHER): Payer: Medicare Other | Admitting: Internal Medicine

## 2020-06-30 ENCOUNTER — Encounter (INDEPENDENT_AMBULATORY_CARE_PROVIDER_SITE_OTHER): Payer: Self-pay | Admitting: Internal Medicine

## 2020-06-30 VITALS — BP 95/63 | HR 94 | Temp 98.0°F | Ht 64.0 in | Wt 124.1 lb

## 2020-06-30 DIAGNOSIS — B181 Chronic viral hepatitis B without delta-agent: Secondary | ICD-10-CM | POA: Diagnosis not present

## 2020-06-30 DIAGNOSIS — R634 Abnormal weight loss: Secondary | ICD-10-CM | POA: Diagnosis not present

## 2020-06-30 NOTE — Progress Notes (Signed)
Presenting complaint;  Follow for chronic hepatitis B. History of weight loss.  Database and subjective:  Patient is 50 year old female who has chronic hepatitis B, F2/F3 disease based on elastography of 11/20/2014. Liver biopsy in December 2016 only showed some fibrosis around central veins.  She has been on barracuda/entecavir since January 2017.  She has not experienced any side effects with the therapy.  HBV DNA by PCR has become undetectable but she has not cleared the virus.  Therefore she has been maintained on therapy. She also has a history of weight loss.  She actually had gained 6 pounds at the time of her last visit. She is accompanied by Ms. [place] Wilson who works at the facility where patient lives.  She denies abdominal pain nausea vomiting melena or rectal bleeding.  She also denies pruritus.  According to Ms. Wilson her appetite is good.  She is prone to constipation.  She denies melena or rectal bleeding.  Lately she has been doing less walking.  Ms. Redmond Pulling feels that she is depressed.  Dr. Warrick Parisian is aware of her condition.  She also has schizophrenia. She has lost 2 pounds since her last visit.  Current Medications: Outpatient Encounter Medications as of 06/30/2020  Medication Sig  . acetaminophen (TYLENOL) 650 MG CR tablet Take 650 mg by mouth every 8 (eight) hours as needed for pain.  . benztropine (COGENTIN) 1 MG tablet Take 1 tablet (1 mg total) by mouth 2 (two) times daily.  . chlorproMAZINE (THORAZINE) 50 MG tablet Take 50 mg by mouth as needed. One tablet by mouth PRN agitation, max of 2 tablets, 100 mg in 24 hours  . Cholecalciferol (VITAMIN D3) 50 MCG (2000 UT) capsule TAKE 1 CAPSULE BY MOUTH EVERY MORNING.  . cloZAPine (CLOZARIL) 100 MG tablet Take 250 mg by mouth at bedtime.  . cloZAPine (CLOZARIL) 100 MG tablet Take 150 mg by mouth every morning.   . entecavir (BARACLUDE) 0.5 MG tablet TAKE (1) TABLET BY MOUTH ONCE DAILY.  Marland Kitchen Fish Oil-Cholecalciferol (FISH  OIL + D3) 1200-1000 MG-UNIT CAPS TAKE 1 CAPSULE BY MOUTH TWICE DAILY.  Marland Kitchen FLUoxetine (PROZAC) 20 MG capsule TAKE 3 CAPSULES BY MOUTH ONCE DAILY.  . fluticasone (FLONASE) 50 MCG/ACT nasal spray SPRAY 2 SPRAYS IN RIGHT NOSTRIL ONLY TWICE DAILY.  Marland Kitchen ibuprofen (ADVIL) 600 MG tablet TAKE 1 TABLET BY MOUTH WITH FOOD 4 TIMES DAILY AS NEEDED FOR ACTIVE MODERATE PAIN UNRELIEVED BY ACETAMINOPHEN. MAX 2 DOSES/24 HR.  Marland Kitchen levonorgestrel (MIRENA) 20 MCG/24HR IUD 1 each by Intrauterine route once.  . Multiple Vitamins-Minerals (CENTROVITE) TABS TAKE 1 TABLET BY MOUTH AT BEDTIME.  . Skin Protectants, Misc. (MINERIN CREME) CREA APPLY MODERATE AMOUNT TOPICALLY TO DRY SKIN ON SOLES & HEELS OF BOTH FEET AFTER SHOWER. (NURSING STAFF TO SUPERVISE APPLICATION)  . VENTOLIN HFA 108 (90 Base) MCG/ACT inhaler INHALE 2 PUFFS EVERY 4 HOURS AS NEEDED FOR WHEEZING.  . vitamin E 200 UNIT capsule Take 1 capsule (200 Units total) by mouth daily.   No facility-administered encounter medications on file as of 06/30/2020.     Objective: Blood pressure 95/63, pulse 94, temperature 98 F (36.7 C), temperature source Oral, height 5' 4"  (1.626 m), weight 124 lb 1.3 oz (56.3 kg). Patient is alert and in no acute distress. She is wearing a mask. Conjunctiva is pink. Sclera is nonicteric Oropharyngeal mucosa is normal. No neck masses or thyromegaly noted. Cardiac exam with regular rhythm normal S1 and S2. No murmur or gallop noted. Lungs are clear to  auscultation. Abdomen she is wearing depends.  Abdomen is flat soft and nontender with organomegaly or masses. No LE edema or clubbing noted.  Labs/studies Results:  CBC Latest Ref Rng & Units 09/26/2019 03/21/2019 09/18/2018  WBC 3.4 - 10.8 x10E3/uL 7.5 6.8 7.6  Hemoglobin 11.1 - 15.9 g/dL 12.5 11.8 12.1  Hematocrit 34.0 - 46.6 % 38.1 35.6 37.4  Platelets 150 - 450 x10E3/uL 167 292 218    CMP Latest Ref Rng & Units 01/08/2020 09/26/2019 03/21/2019  Glucose 65 - 99 mg/dL 109(H) 81 81   BUN 6 - 24 mg/dL 10 11 9   Creatinine 0.57 - 1.00 mg/dL 0.80 0.75 0.67  Sodium 134 - 144 mmol/L 144 146(H) 140  Potassium 3.5 - 5.2 mmol/L 4.2 3.9 3.8  Chloride 96 - 106 mmol/L 104 107(H) 104  CO2 20 - 29 mmol/L 23 26 28   Calcium 8.7 - 10.2 mg/dL 9.7 9.2 9.1  Total Protein 6.0 - 8.5 g/dL 6.4 6.4 6.2  Total Bilirubin 0.0 - 1.2 mg/dL 0.3 0.4 0.5  Alkaline Phos 44 - 121 IU/L 85 78 -  AST 0 - 40 IU/L 19 17 10   ALT 0 - 32 IU/L 18 15 10     Hepatic Function Latest Ref Rng & Units 01/08/2020 09/26/2019 03/21/2019  Total Protein 6.0 - 8.5 g/dL 6.4 6.4 6.2  Albumin 3.8 - 4.8 g/dL 4.2 4.4 -  AST 0 - 40 IU/L 19 17 10   ALT 0 - 32 IU/L 18 15 10   Alk Phosphatase 44 - 121 IU/L 85 78 -  Total Bilirubin 0.0 - 1.2 mg/dL 0.3 0.4 0.5  Bilirubin, Direct 0.1 - 0.5 mg/dL - - -    Lab data from 09/26/2019 HBV DNA by PCR not detectable Hepatitis B surface antigen positive.  Lab data from 09/18/2018 Hep B E antibody reactive  Ultrasound from 05/10/2019 revealed 2.4 cm gallstone within gallbladder neck.  No distention or wall thickening noted.  Bile duct measured 4 mm. Portal vein patent with normal blood flow. Spleen not enlarged.  Assessment:  #1.  Chronic hepatitis B.  She is felt to have stage F2 and F3 disease based on elastography but liver biopsy only showed fibrosis noncentral remains.  Viral activity has been effectively suppressed with polymerase inhibitor but she has not cleared the virus.  She has completed the 5 years of therapy.  Since she is not having any side effects it would be reasonable to continue therapy.  #2.  History of weight loss.  Weight loss seem to have leveled off.  Patient encouraged to eat snacks between meals.  #3.  Patient is average risk for CRC.  She should consider screening colonoscopy and if she does not want a colonoscopy will proceed with Cologuard test. Ms. Redmond Pulling will confer with the power of attorney and let us know.  Plan:  Patient will go to the lab for CBC,  comprehensive chemistry panel, hepatitis B surface antigen and HBV DNA by PCR. Office visit in 6 months.

## 2020-06-30 NOTE — Patient Instructions (Addendum)
Physician will call with results of blood test when completed. Please call office to schedule colonoscopy if power of attorney agrees.

## 2020-07-02 LAB — CBC
HCT: 40.3 % (ref 35.0–45.0)
Hemoglobin: 13.2 g/dL (ref 11.7–15.5)
MCH: 29.9 pg (ref 27.0–33.0)
MCHC: 32.8 g/dL (ref 32.0–36.0)
MCV: 91.2 fL (ref 80.0–100.0)
MPV: 9.7 fL (ref 7.5–12.5)
Platelets: 216 10*3/uL (ref 140–400)
RBC: 4.42 10*6/uL (ref 3.80–5.10)
RDW: 12 % (ref 11.0–15.0)
WBC: 9 10*3/uL (ref 3.8–10.8)

## 2020-07-02 LAB — COMPREHENSIVE METABOLIC PANEL
AG Ratio: 1.8 (calc) (ref 1.0–2.5)
ALT: 13 U/L (ref 6–29)
AST: 16 U/L (ref 10–35)
Albumin: 4.1 g/dL (ref 3.6–5.1)
Alkaline phosphatase (APISO): 64 U/L (ref 31–125)
BUN: 13 mg/dL (ref 7–25)
CO2: 31 mmol/L (ref 20–32)
Calcium: 9.4 mg/dL (ref 8.6–10.2)
Chloride: 105 mmol/L (ref 98–110)
Creat: 0.73 mg/dL (ref 0.50–1.10)
Globulin: 2.3 g/dL (calc) (ref 1.9–3.7)
Glucose, Bld: 89 mg/dL (ref 65–139)
Potassium: 4.1 mmol/L (ref 3.5–5.3)
Sodium: 142 mmol/L (ref 135–146)
Total Bilirubin: 0.5 mg/dL (ref 0.2–1.2)
Total Protein: 6.4 g/dL (ref 6.1–8.1)

## 2020-07-02 LAB — HEPATITIS B DNA, ULTRAQUANTITATIVE, PCR
Hepatitis B DNA (Calc): <1 Log IU/mL
Hepatitis B DNA: <10 IU/mL

## 2020-07-02 LAB — HEPATITIS B SURFACE ANTIGEN: Hepatitis B Surface Ag: REACTIVE — AB

## 2020-07-08 ENCOUNTER — Encounter: Payer: Self-pay | Admitting: Family Medicine

## 2020-07-08 ENCOUNTER — Ambulatory Visit: Payer: Medicare Other | Admitting: Family Medicine

## 2020-07-22 ENCOUNTER — Ambulatory Visit (INDEPENDENT_AMBULATORY_CARE_PROVIDER_SITE_OTHER): Payer: Medicare Other

## 2020-07-22 VITALS — Ht 64.0 in | Wt 123.0 lb

## 2020-07-22 DIAGNOSIS — Z0000 Encounter for general adult medical examination without abnormal findings: Secondary | ICD-10-CM

## 2020-07-22 DIAGNOSIS — Z1231 Encounter for screening mammogram for malignant neoplasm of breast: Secondary | ICD-10-CM | POA: Diagnosis not present

## 2020-07-22 NOTE — Patient Instructions (Signed)
Margaret [name] , Thank you for taking time to come for your Medicare Wellness Visit. I appreciate your ongoing commitment to your health goals. Please review the following plan we discussed and let me know if I can assist you in the future.   Screening recommendations/referrals: Colonoscopy: Due at age 50 Mammogram: Done 05/16/19 - Repeat due - order placed today Bone Density: Due at age 36 Recommended yearly ophthalmology/optometry visit for glaucoma screening and checkup Recommended yearly dental visit for hygiene and checkup  Vaccinations: Influenza vaccine: Done 01/07/2020 - Repeat every year Pneumococcal vaccine: Due at age 25 and 44 Tdap vaccine: Due (every 10 years) Shingles vaccine: Due at age 52  Covid-19: Done 06/10/19, 07/09/19, & 02/11/20  Advanced directives: Please bring a copy of your health care power of attorney and living will to the office to be added to your chart at your convenience.  Conditions/risks identified: Try to increase exercise to at least 30 minutes 3-5 days per week, drink plenty of water and eat more fruits and vegetables.  Next appointment: Follow up in one year for your annual wellness visit.   Preventive Care 40-64 Years, Female Preventive care refers to lifestyle choices and visits with your health care provider that can promote health and wellness. What does preventive care include?  A yearly physical exam. This is also called an annual well check.  Dental exams once or twice a year.  Routine eye exams. Ask your health care provider how often you should have your eyes checked.  Personal lifestyle choices, including:  Daily care of your teeth and gums.  Regular physical activity.  Eating a healthy diet.  Avoiding tobacco and drug use.  Limiting alcohol use.  Practicing safe sex.  Taking low-dose aspirin daily starting at age 57.  Taking vitamin and mineral supplements as recommended by your health care provider. What happens during an  annual well check? The services and screenings done by your health care provider during your annual well check will depend on your age, overall health, lifestyle risk factors, and family history of disease. Counseling  Your health care provider may ask you questions about your:  Alcohol use.  Tobacco use.  Drug use.  Emotional well-being.  Home and relationship well-being.  Sexual activity.  Eating habits.  Work and work Statistician.  Method of birth control.  Menstrual cycle.  Pregnancy history. Screening  You may have the following tests or measurements:  Height, weight, and BMI.  Blood pressure.  Lipid and cholesterol levels. These may be checked every 5 years, or more frequently if you are over 19 years old.  Skin check.  Lung cancer screening. You may have this screening every year starting at age 45 if you have a 30-pack-year history of smoking and currently smoke or have quit within the past 15 years.  Fecal occult blood test (FOBT) of the stool. You may have this test every year starting at age 40.  Flexible sigmoidoscopy or colonoscopy. You may have a sigmoidoscopy every 5 years or a colonoscopy every 10 years starting at age 34.  Hepatitis C blood test.  Hepatitis B blood test.  Sexually transmitted disease (STD) testing.  Diabetes screening. This is done by checking your blood sugar (glucose) after you have not eaten for a while (fasting). You may have this done every 1-3 years.  Mammogram. This may be done every 1-2 years. Talk to your health care provider about when you should start having regular mammograms. This may depend on whether you  have a family history of breast cancer.  BRCA-related cancer screening. This may be done if you have a family history of breast, ovarian, tubal, or peritoneal cancers.  Pelvic exam and Pap test. This may be done every 3 years starting at age 49. Starting at age 68, this may be done every 5 years if you have a Pap  test in combination with an HPV test.  Bone density scan. This is done to screen for osteoporosis. You may have this scan if you are at high risk for osteoporosis. Discuss your test results, treatment options, and if necessary, the need for more tests with your health care provider. Vaccines  Your health care provider may recommend certain vaccines, such as:  Influenza vaccine. This is recommended every year.  Tetanus, diphtheria, and acellular pertussis (Tdap, Td) vaccine. You may need a Td booster every 10 years.  Zoster vaccine. You may need this after age 58.  Pneumococcal 13-valent conjugate (PCV13) vaccine. You may need this if you have certain conditions and were not previously vaccinated.  Pneumococcal polysaccharide (PPSV23) vaccine. You may need one or two doses if you smoke cigarettes or if you have certain conditions. Talk to your health care provider about which screenings and vaccines you need and how often you need them. This information is not intended to replace advice given to you by your health care provider. Make sure you discuss any questions you have with your health care provider. Document Released: 04/17/2015 Document Revised: 12/09/2015 Document Reviewed: 01/20/2015 Elsevier Interactive Patient Education  2017 [place] Prevention in the Home Falls can cause injuries. They can happen to people of all ages. There are many things you can do to make your home safe and to help prevent falls. What can I do on the outside of my home?  Regularly fix the edges of walkways and driveways and fix any cracks.  Remove anything that might make you trip as you walk through a door, such as a raised step or threshold.  Trim any bushes or trees on the path to your home.  Use bright outdoor lighting.  Clear any walking paths of anything that might make someone trip, such as rocks or tools.  Regularly check to see if handrails are loose or broken. Make sure that  both sides of any steps have handrails.  Any raised decks and porches should have guardrails on the edges.  Have any leaves, snow, or ice cleared regularly.  Use sand or salt on walking paths during winter.  Clean up any spills in your garage right away. This includes oil or grease spills. What can I do in the bathroom?  Use night lights.  Install grab bars by the toilet and in the tub and shower. Do not use towel bars as grab bars.  Use non-skid mats or decals in the tub or shower.  If you need to sit down in the shower, use a plastic, non-slip stool.  Keep the floor dry. Clean up any water that spills on the floor as soon as it happens.  Remove soap buildup in the tub or shower regularly.  Attach bath mats securely with double-sided non-slip rug tape.  Do not have throw rugs and other things on the floor that can make you trip. What can I do in the bedroom?  Use night lights.  Make sure that you have a light by your bed that is easy to reach.  Do not use any sheets or blankets  that are too big for your bed. They should not hang down onto the floor.  Have a firm chair that has side arms. You can use this for support while you get dressed.  Do not have throw rugs and other things on the floor that can make you trip. What can I do in the kitchen?  Clean up any spills right away.  Avoid walking on wet floors.  Keep items that you use a lot in easy-to-reach places.  If you need to reach something above you, use a strong step stool that has a grab bar.  Keep electrical cords out of the way.  Do not use floor polish or wax that makes floors slippery. If you must use wax, use non-skid floor wax.  Do not have throw rugs and other things on the floor that can make you trip. What can I do with my stairs?  Do not leave any items on the stairs.  Make sure that there are handrails on both sides of the stairs and use them. Fix handrails that are broken or loose. Make sure  that handrails are as long as the stairways.  Check any carpeting to make sure that it is firmly attached to the stairs. Fix any carpet that is loose or worn.  Avoid having throw rugs at the top or bottom of the stairs. If you do have throw rugs, attach them to the floor with carpet tape.  Make sure that you have a light switch at the top of the stairs and the bottom of the stairs. If you do not have them, ask someone to add them for you. What else can I do to help prevent falls?  Wear shoes that:  Do not have high heels.  Have rubber bottoms.  Are comfortable and fit you well.  Are closed at the toe. Do not wear sandals.  If you use a stepladder:  Make sure that it is fully opened. Do not climb a closed stepladder.  Make sure that both sides of the stepladder are locked into place.  Ask someone to hold it for you, if possible.  Clearly mark and make sure that you can see:  Any grab bars or handrails.  First and last steps.  Where the edge of each step is.  Use tools that help you move around (mobility aids) if they are needed. These include:  Canes.  Walkers.  Scooters.  Crutches.  Turn on the lights when you go into a dark area. Replace any light bulbs as soon as they burn out.  Set up your furniture so you have a clear path. Avoid moving your furniture around.  If any of your floors are uneven, fix them.  If there are any pets around you, be aware of where they are.  Review your medicines with your doctor. Some medicines can make you feel dizzy. This can increase your chance of falling. Ask your doctor what other things that you can do to help prevent falls. This information is not intended to replace advice given to you by your health care provider. Make sure you discuss any questions you have with your health care provider. Document Released: 01/15/2009 Document Revised: 08/27/2015 Document Reviewed: 04/25/2014 Elsevier Interactive Patient Education   2017 [employer].

## 2020-07-22 NOTE — Progress Notes (Addendum)
Subjective:   Journe Hallmark is a 50 y.o. female who presents for Medicare Annual (Subsequent) preventive examination.  Virtual Visit via Telephone Note  I connected with  Nalea Salce on 07/22/20 at  4:15 PM EDT by telephone and verified that I am speaking with the correct person using two identifiers.  Location: Patient: Home (group home) Provider: [facility] Persons participating in the virtual visit: patient, caretaker/Nurse Health Advisor   I discussed the limitations, risks, security and privacy concerns of performing an evaluation and management service by telephone and the availability of in person appointments. The patient expressed understanding and agreed to proceed.  Interactive audio and video telecommunications were attempted between this nurse and patient, however failed, due to patient having technical difficulties OR patient did not have access to video capability.  We continued and completed visit with audio only.  Some vital signs may be absent or patient reported.   Dorrien Grunder E Maddelynn Moosman, LPN   Review of Systems     Cardiac Risk Factors include: sedentary lifestyle     Objective:    Today's Vitals   07/22/20 1429  Weight: 123 lb (55.8 kg)  Height: 5' 4"  (1.626 m)   Body mass index is 21.11 kg/m.  Advanced Directives 07/22/2020 04/29/2019 04/26/2018 05/09/2016 03/24/2015  Does Patient Have a Medical Advance Directive? Unable to assess, patient is non-responsive or altered mental status No No (No Data) No  Would patient like information on creating a medical advance directive? - No - Patient declined Yes (MAU/Ambulatory/Procedural Areas - Information given) (No Data) No - patient declined information    Current Medications (verified) Outpatient Encounter Medications as of 07/22/2020  Medication Sig  . acetaminophen (TYLENOL) 650 MG CR tablet Take 650 mg by mouth every 8 (eight) hours as needed for pain.  . benztropine (COGENTIN) 1 MG tablet Take 1 tablet (1 mg total) by  mouth 2 (two) times daily.  . chlorproMAZINE (THORAZINE) 50 MG tablet Take 50 mg by mouth as needed. One tablet by mouth PRN agitation, max of 2 tablets, 100 mg in 24 hours  . Cholecalciferol (VITAMIN D3) 50 MCG (2000 UT) capsule TAKE 1 CAPSULE BY MOUTH EVERY MORNING.  . cloZAPine (CLOZARIL) 100 MG tablet Take 250 mg by mouth at bedtime.  . DULoxetine (CYMBALTA) 60 MG capsule Take by mouth.  . entecavir (BARACLUDE) 0.5 MG tablet TAKE (1) TABLET BY MOUTH ONCE DAILY.  Marland Kitchen Fish Oil-Cholecalciferol (FISH OIL + D3) 1200-1000 MG-UNIT CAPS TAKE 1 CAPSULE BY MOUTH TWICE DAILY.  Marland Kitchen FLUoxetine (PROZAC) 20 MG capsule TAKE 3 CAPSULES BY MOUTH ONCE DAILY.  . fluticasone (FLONASE) 50 MCG/ACT nasal spray SPRAY 2 SPRAYS IN RIGHT NOSTRIL ONLY TWICE DAILY.  Marland Kitchen ibuprofen (ADVIL) 600 MG tablet TAKE 1 TABLET BY MOUTH WITH FOOD 4 TIMES DAILY AS NEEDED FOR ACTIVE MODERATE PAIN UNRELIEVED BY ACETAMINOPHEN. MAX 2 DOSES/24 HR.  Marland Kitchen levonorgestrel (MIRENA) 20 MCG/24HR IUD 1 each by Intrauterine route once.  . Multiple Vitamins-Minerals (CENTROVITE) TABS TAKE 1 TABLET BY MOUTH AT BEDTIME.  . Skin Protectants, Misc. (MINERIN CREME) CREA APPLY MODERATE AMOUNT TOPICALLY TO DRY SKIN ON SOLES & HEELS OF BOTH FEET AFTER SHOWER. (NURSING STAFF TO SUPERVISE APPLICATION)  . VENTOLIN HFA 108 (90 Base) MCG/ACT inhaler INHALE 2 PUFFS EVERY 4 HOURS AS NEEDED FOR WHEEZING.  . vitamin E 200 UNIT capsule Take 1 capsule (200 Units total) by mouth daily.  . [DISCONTINUED] Vitamin E 200 units TABS Take 1 capsule  once a day  . [DISCONTINUED] cloZAPine (CLOZARIL)  100 MG tablet Take 150 mg by mouth every morning.  (Patient not taking: Reported on 07/22/2020)   No facility-administered encounter medications on file as of 07/22/2020.    Allergies (verified) Patient has no known allergies.   History: Past Medical History:  Diagnosis Date  . Hematuria 11/12/2014  . Hepatitis B carrier ([place])   . Hypothyroidism   . Impulse disorder   . IUD  (intrauterine device) in place 11/12/2014   [facility]24/12 [facility]  . Mild intellectual disability   . Schizophrenia ([place])   . Schizophrenia ([place])   . Tuberculosis    as a child  . Urinary frequency 11/12/2014   Past Surgical History:  Procedure Laterality Date  . ARM WOUND REPAIR / CLOSURE    . TRACHEAL SURGERY    . TRACHEOSTOMY     Family History  Adopted: Yes  Family history unknown: Yes   Social History   Socioeconomic History  . Marital status: Single    Spouse name: Not on file  . Number of children: 0  . Years of education: 10  . Highest education level: High school graduate  Occupational History  . Occupation: disabled    Comment: due to schizophrenia  Tobacco Use  . Smoking status: Never Smoker  . Smokeless tobacco: Never Used  Vaping Use  . Vaping Use: Never used  Substance and Sexual Activity  . Alcohol use: No    Alcohol/week: 0.0 standard drinks  . Drug use: No  . Sexual activity: Not Currently    Birth control/protection: I.U.D.  Other Topics Concern  . Not on file  Social History Narrative   Patient was adopted. She does not have any children and has never been married. She lives in a group home and is disabled. Has a diagnosis of schizophrenia. She socially isolates herself, doesn't like to participate in group activities, sleeps a lot, has to be prompted to bathe, eat, dress, etc. -07/22/20   Social Determinants of Health   Financial Resource Strain: Low Risk   . Difficulty of Paying Living Expenses: Not hard at all  Food Insecurity: No Food Insecurity  . Worried About Charity fundraiser in the Last Year: Never true  . Ran Out of Food in the Last Year: Never true  Transportation Needs: No Transportation Needs  . Lack of Transportation (Medical): No  . Lack of Transportation (Non-Medical): No  Physical Activity: Insufficiently Active  . Days of Exercise per Week: 1 day  . Minutes of Exercise per Session: 20 min  Stress: Stress Concern Present   . Feeling of Stress : To some extent  Social Connections: Socially Isolated  . Frequency of Communication with Friends and Family: More than three times a week  . Frequency of Social Gatherings with Friends and Family: More than three times a week  . Attends Religious Services: Never  . Active Member of Clubs or Organizations: No  . Attends Archivist Meetings: Never  . Marital Status: Never married    Tobacco Counseling Counseling given: Not Answered   Clinical Intake:  Pre-visit preparation completed: Yes  Pain : No/denies pain     BMI - recorded: 21.11 Nutritional Status: BMI of 19-24  Normal Nutritional Risks: None Diabetes: No  How often do you need to have someone help you when you read instructions, pamphlets, or other written materials from your doctor or pharmacy?: 1 - Never  Diabetic? No  Interpreter Needed?: No  Information entered by :: Adalberto Cole, LPN  Activities of Daily Living In your present state of health, do you have any difficulty performing the following activities: 07/22/2020  Hearing? N  Vision? N  Difficulty concentrating or making decisions? Y  Walking or climbing stairs? N  Dressing or bathing? Y  Doing errands, shopping? Y  Preparing Food and eating ? Y  Using the Toilet? Y  In the past six months, have you accidently leaked urine? Y  Do you have problems with loss of bowel control? N  Managing your Medications? Y  Managing your Finances? Y  Housekeeping or managing your Housekeeping? Y  Some recent data might be hidden    Patient Care Team: Dettinger, Fransisca Kaufmann, MD as PCP - General (Family Medicine) Rogene Houston, MD as Consulting Physician (Gastroenterology)  Indicate any recent Medical Services you may have received from other than [facility] providers in the past year (date may be approximate).     Assessment:   This is a routine wellness examination for Jen.  Hearing/Vision screen  Hearing Screening   125Hz   250Hz  500Hz  1000Hz  2000Hz  3000Hz  4000Hz  6000Hz  8000Hz   Right ear:           Left ear:           Comments: Denies hearing difficulties  Vision Screening Comments: She is supposed to wear glasses, but usually doesn't - Annual visits with Dr Marin Comment in [place] - up to date with eye exam.  Dietary issues and exercise activities discussed: Current Exercise Habits: Home exercise routine, Type of exercise: calisthenics, Time (Minutes): 15, Frequency (Times/Week): 1, Weekly Exercise (Minutes/Week): 15, Intensity: Moderate, Exercise limited by: None identified  Goals    .  DIET - INCREASE WATER INTAKE      Try to drink 6-8 glasses of water daily    .  Patient Stated (pt-stated)      Increase social interaction with friends at the group home.       Depression Screen PHQ 2/9 Scores 07/22/2020 01/08/2020 04/29/2019 05/23/2018 04/26/2018 03/14/2018 09/06/2017  PHQ - 2 Score 2 2 2 2 4 2 2   PHQ- 9 Score 6 8 - 9 11 5 6   Exception Documentation - - - - - - -  Not completed - - - - - - -    Fall Risk Fall Risk  07/22/2020 01/08/2020 04/29/2019 05/23/2018 04/26/2018  Falls in the past year? 0 0 0 0 0  Number falls in past yr: 0 - - - -  Injury with Fall? 0 - - - -  Risk for fall due to : No Fall Risks - - - -  Follow up Falls prevention discussed - - - -    FALL RISK PREVENTION PERTAINING TO THE HOME:  Any stairs in or around the home? No  If so, are there any without handrails? No  Home free of loose throw rugs in walkways, pet beds, electrical cords, etc? Yes  Adequate lighting in your home to reduce risk of falls? Yes   ASSISTIVE DEVICES UTILIZED TO PREVENT FALLS:  Life alert? No  Use of a cane, walker or w/c? No  Grab bars in the bathroom? Yes  Shower chair or bench in shower? Yes  Elevated toilet seat or a handicapped toilet? Yes   TIMED UP AND GO:  Was the test performed? No . Telephonic visit.  Cognitive Function: Cognitive status assessed by direct observation. Patient is unable to complete  screening 6CIT or MMSE. She did not want to answer questions today.  6CIT Screen 04/26/2018  What Year? 0 points  What month? 0 points  What time? 0 points  Count back from 20 0 points  Months in reverse 2 points  Repeat phrase 10 points  Total Score 12    Immunizations Immunization History  Administered Date(s) Administered  . Influenza,inj,Quad PF,6+ Mos 12/30/2015, 12/30/2016, 03/14/2018, 02/20/2019  . Influenza-Unspecified 01/12/2015, 01/07/2020  . Moderna Sars-Covid-2 Vaccination 06/10/2019, 07/09/2019, 02/11/2020    TDAP status: Due, Education has been provided regarding the importance of this vaccine. Advised may receive this vaccine at local pharmacy or Health Dept. Aware to provide a copy of the vaccination record if obtained from local pharmacy or Health Dept. Verbalized acceptance and understanding.  Flu Vaccine status: Up to date  Pneumococcal vaccine status: Up to date  Covid-19 vaccine status: Completed vaccines  Qualifies for Shingles Vaccine? No   Zostavax completed No   Shingrix Completed?: No.    Education has been provided regarding the importance of this vaccine. Patient has been advised to call insurance company to determine out of pocket expense if they have not yet received this vaccine. Advised may also receive vaccine at local pharmacy or Health Dept. Verbalized acceptance and understanding.  Screening Tests Health Maintenance  Topic Date Due  . COLONOSCOPY (Pts 45-71yr Insurance coverage will need to be confirmed)  Never done  . MAMMOGRAM  05/13/2020  . INFLUENZA VACCINE  11/02/2020  . PAP SMEAR-Modifier  05/23/2021  . TETANUS/TDAP  06/30/2026  . COVID-19 Vaccine  Completed  . Hepatitis C Screening  Completed  . HIV Screening  Completed  . HPV VACCINES  Aged Out    Health Maintenance  Health Maintenance Due  Topic Date Due  . COLONOSCOPY (Pts 45-430yrInsurance coverage will need to be confirmed)  Never done  . MAMMOGRAM  05/13/2020     Colorectal screening: Due at age 389Mammogram status: Completed 05/06/19. Repeat every year  Bone Density Scan: due at age 8544Lung Cancer Screening: (Low Dose CT Chest recommended if Age 726-80ears, 30 pack-year currently smoking OR have quit w/in 15years.) does not qualify.   Additional Screening:  Hepatitis C Screening: does not qualify  Vision Screening: Recommended annual ophthalmology exams for early detection of glaucoma and other disorders of the eye. Is the patient up to date with their annual eye exam?  Yes  Who is the provider or what is the name of the office in which the patient attends annual eye exams? Dr LeMarin Commentn [facility] [place] pt is not established with a provider, would they like to be referred to a provider to establish care? No .   Dental Screening: Recommended annual dental exams for proper oral hygiene  Community Resource Referral / Chronic Care Management: CRR required this visit?  No   CCM required this visit?  No      Plan:     I have personally reviewed and noted the following in the patient's chart:   . Medical and social history . Use of alcohol, tobacco or illicit drugs  . Current medications and supplements . Functional ability and status . Nutritional status . Physical activity . Advanced directives . List of other physicians . Hospitalizations, surgeries, and ER visits in previous 12 months . Vitals . Screenings to include cognitive, depression, and falls . Referrals and appointments  In addition, I have reviewed and discussed with patient certain preventive protocols, quality metrics, and best practice recommendations. A written personalized care plan for preventive services as well  as general preventive health recommendations were provided to patient.     Sandrea Hammond, LPN   [id number]   Nurse Notes: None

## 2020-07-30 ENCOUNTER — Other Ambulatory Visit: Payer: Self-pay

## 2020-07-30 ENCOUNTER — Encounter: Payer: Self-pay | Admitting: Family Medicine

## 2020-07-30 ENCOUNTER — Ambulatory Visit (INDEPENDENT_AMBULATORY_CARE_PROVIDER_SITE_OTHER): Payer: Medicare Other | Admitting: Family Medicine

## 2020-07-30 VITALS — BP 97/64 | HR 106 | Ht 64.0 in | Wt 125.0 lb

## 2020-07-30 DIAGNOSIS — Z1231 Encounter for screening mammogram for malignant neoplasm of breast: Secondary | ICD-10-CM

## 2020-07-30 DIAGNOSIS — E782 Mixed hyperlipidemia: Secondary | ICD-10-CM

## 2020-07-30 MED ORDER — ALBUTEROL SULFATE HFA 108 (90 BASE) MCG/ACT IN AERS: INHALATION_SPRAY | RESPIRATORY_TRACT | 2 refills | Status: DC

## 2020-07-30 MED ORDER — FLUOXETINE HCL 20 MG PO CAPS: 60.0000 mg | ORAL_CAPSULE | Freq: Every day | ORAL | 3 refills | Status: DC

## 2020-07-30 NOTE — Progress Notes (Signed)
BP 97/64   Pulse (!) 106   Ht 5' 4"  (1.626 m)   Wt 125 lb (56.7 kg)   SpO2 97%   BMI 21.46 kg/m    Subjective:   [name], female    DOB: Dec 09, 1970, 50 y.o.   MRN: [id number]  HPI: Margaret [name] is a 50 y.o. female presenting on 07/30/2020 for Medical Management of Chronic Issues and Hyperlipidemia   HPI Hyperlipidemia Patient is coming in for recheck of his hyperlipidemia. The patient is currently taking fish oil. They deny any issues with myalgias or history of liver damage from it. They deny any focal numbness or weakness or chest pain.   Relevant past medical, surgical, family and social history reviewed and updated as indicated. Interim medical history since our last visit reviewed. Allergies and medications reviewed and updated.  Review of Systems  Constitutional: Negative for chills and fever.  Eyes: Negative for visual disturbance.  Respiratory: Negative for chest tightness and shortness of breath.   Cardiovascular: Negative for chest pain and leg swelling.  Genitourinary: Negative for difficulty urinating and dysuria.  Musculoskeletal: Negative for back pain and gait problem.  Skin: Negative for rash.  Neurological: Negative for light-headedness and headaches.  Psychiatric/Behavioral: Positive for dysphoric mood and sleep disturbance. Negative for agitation, behavioral problems, self-injury and suicidal ideas. The patient is nervous/anxious.   All other systems reviewed and are negative.   Per HPI unless specifically indicated above   Allergies as of 07/30/2020   No Known Allergies     Medication List       Accurate as of July 30, 2020  9:00 AM. If you have any questions, ask your nurse or doctor.        acetaminophen 650 MG CR tablet Commonly known as: TYLENOL Take 650 mg by mouth every 8 (eight) hours as needed for pain.   benztropine 1 MG tablet Commonly known as: COGENTIN Take 1 tablet (1 mg total) by mouth 2 (two) times  daily.   CENTROVITE Tabs TAKE 1 TABLET BY MOUTH AT BEDTIME.   chlorproMAZINE 50 MG tablet Commonly known as: THORAZINE Take 50 mg by mouth as needed. One tablet by mouth PRN agitation, max of 2 tablets, 100 mg in 24 hours   cloZAPine 100 MG tablet Commonly known as: CLOZARIL Take 250 mg by mouth at bedtime.   DULoxetine 60 MG capsule Commonly known as: CYMBALTA Take by mouth.   entecavir 0.5 MG tablet Commonly known as: BARACLUDE TAKE (1) TABLET BY MOUTH ONCE DAILY.   Fish Oil + D3 1200-1000 MG-UNIT Caps TAKE 1 CAPSULE BY MOUTH TWICE DAILY.   FLUoxetine 20 MG capsule Commonly known as: PROZAC TAKE 3 CAPSULES BY MOUTH ONCE DAILY.   fluticasone 50 MCG/ACT nasal spray Commonly known as: FLONASE SPRAY 2 SPRAYS IN RIGHT NOSTRIL ONLY TWICE DAILY.   ibuprofen 600 MG tablet Commonly known as: ADVIL TAKE 1 TABLET BY MOUTH WITH FOOD 4 TIMES DAILY AS NEEDED FOR ACTIVE MODERATE PAIN UNRELIEVED BY ACETAMINOPHEN. MAX 2 DOSES/24 HR.   levonorgestrel 20 MCG/24HR IUD Commonly known as: MIRENA 1 each by Intrauterine route once.   Minerin Creme Crea APPLY MODERATE AMOUNT TOPICALLY TO DRY SKIN ON SOLES & HEELS OF BOTH FEET AFTER SHOWER. (NURSING STAFF TO SUPERVISE APPLICATION)   Ventolin HFA 108 (90 Base) MCG/ACT inhaler Generic drug: albuterol INHALE 2 PUFFS EVERY 4 HOURS AS NEEDED FOR WHEEZING.   Vitamin D3 50 MCG (2000 UT) capsule TAKE 1 CAPSULE BY MOUTH EVERY MORNING.  vitamin E 200 UNIT capsule Take 1 capsule (200 Units total) by mouth daily.        Objective:   BP 97/64   Pulse (!) 106   Ht 5' 4"  (1.626 m)   Wt 125 lb (56.7 kg)   SpO2 97%   BMI 21.46 kg/m   Wt Readings from Last 3 Encounters:  07/30/20 125 lb (56.7 kg)  07/22/20 123 lb (55.8 kg)  06/30/20 124 lb 1.3 oz (56.3 kg)    Physical Exam Vitals and nursing note reviewed.  Constitutional:      General: She is not in acute distress.    Appearance: She is well-developed. She is not diaphoretic.   Eyes:     Conjunctiva/sclera: Conjunctivae normal.  Cardiovascular:     Rate and Rhythm: Normal rate and regular rhythm.     Heart sounds: Normal heart sounds. No murmur heard.   Pulmonary:     Effort: Pulmonary effort is normal. No respiratory distress.     Breath sounds: Normal breath sounds. No wheezing.  Musculoskeletal:        General: No tenderness. Normal range of motion.  Skin:    General: Skin is warm and dry.     Findings: No rash.  Neurological:     Mental Status: She is alert and oriented to person, place, and time.     Coordination: Coordination normal.  Psychiatric:        Mood and Affect: Mood is anxious. Mood is not depressed.        Speech: Speech is rapid and pressured. Speech is not tangential.        Behavior: Behavior normal.        Thought Content: Thought content does not include suicidal ideation. Thought content does not include suicidal plan.       Assessment & Plan:   Problem List Items Addressed This Visit      Other   Hyperlipidemia - Primary   Relevant Orders   Lipid panel   CMP14+EGFR   CBC with Differential/Platelet   CMP14+EGFR    Other Visit Diagnoses    Encounter for screening mammogram for malignant neoplasm of breast       Relevant Orders   MM 3D SCREEN BREAST W/IMPLANT BILATERAL      Will order screening mammogram and recheck cholesterol and labs for psychiatric medicine was unable to follow-up with her psychiatrist.  Seems like those medicines are not going as well and she has more pressured speech today.  It is like bipolar is not completely under control and I do have a close follow-up with Dr. Tamera Punt and her psychiatrist. Follow up plan: Return in about 6 months (around 01/29/2021), or if symptoms worsen or fail to improve, for Physical exam.  Counseling provided for all of the vaccine components No orders of the defined types were placed in this encounter.   Caryl Pina, MD [facility] 07/30/2020, 9:00 AM

## 2020-07-31 LAB — LIPID PANEL
Chol/HDL Ratio: 2.8 ratio (ref 0.0–4.4)
Cholesterol, Total: 164 mg/dL (ref 100–199)
HDL: 58 mg/dL (ref >39)
LDL Chol Calc (NIH): 96 mg/dL (ref 0–99)
Triglycerides: 45 mg/dL (ref 0–149)
VLDL Cholesterol Cal: 10 mg/dL (ref 5–40)

## 2020-07-31 LAB — CBC WITH DIFFERENTIAL/PLATELET
Basophils Absolute: 0.1 10*3/uL (ref 0.0–0.2)
Basos: 1 %
EOS (ABSOLUTE): 0.1 10*3/uL (ref 0.0–0.4)
Eos: 2 %
Hematocrit: 36.8 % (ref 34.0–46.6)
Hemoglobin: 12.1 g/dL (ref 11.1–15.9)
Immature Grans (Abs): 0 10*3/uL (ref 0.0–0.1)
Immature Granulocytes: 0 %
Lymphocytes Absolute: 1.7 10*3/uL (ref 0.7–3.1)
Lymphs: 28 %
MCH: 29.7 pg (ref 26.6–33.0)
MCHC: 32.9 g/dL (ref 31.5–35.7)
MCV: 90 fL (ref 79–97)
Monocytes Absolute: 0.3 10*3/uL (ref 0.1–0.9)
Monocytes: 5 %
Neutrophils Absolute: 3.8 10*3/uL (ref 1.4–7.0)
Neutrophils: 64 %
Platelets: 212 10*3/uL (ref 150–450)
RBC: 4.07 x10E6/uL (ref 3.77–5.28)
RDW: 12 % (ref 11.7–15.4)
WBC: 6 10*3/uL (ref 3.4–10.8)

## 2020-07-31 LAB — CMP14+EGFR
ALT: 12 IU/L (ref 0–32)
AST: 15 IU/L (ref 0–40)
Albumin/Globulin Ratio: 2 (ref 1.2–2.2)
Albumin: 4.2 g/dL (ref 3.8–4.8)
Alkaline Phosphatase: 72 IU/L (ref 44–121)
BUN/Creatinine Ratio: 12 (ref 9–23)
BUN: 10 mg/dL (ref 6–24)
Bilirubin Total: 0.4 mg/dL (ref 0.0–1.2)
CO2: 26 mmol/L (ref 20–29)
Calcium: 9.1 mg/dL (ref 8.7–10.2)
Chloride: 104 mmol/L (ref 96–106)
Creatinine, Ser: 0.82 mg/dL (ref 0.57–1.00)
Globulin, Total: 2.1 g/dL (ref 1.5–4.5)
Glucose: 89 mg/dL (ref 65–99)
Potassium: 3.7 mmol/L (ref 3.5–5.2)
Sodium: 145 mmol/L — ABNORMAL HIGH (ref 134–144)
Total Protein: 6.3 g/dL (ref 6.0–8.5)
eGFR: 88 mL/min/{1.73_m2} (ref >59)

## 2020-08-13 DIAGNOSIS — F250 Schizoaffective disorder, bipolar type: Secondary | ICD-10-CM | POA: Diagnosis not present

## 2020-09-02 ENCOUNTER — Other Ambulatory Visit (INDEPENDENT_AMBULATORY_CARE_PROVIDER_SITE_OTHER): Payer: Self-pay | Admitting: Internal Medicine

## 2020-09-02 DIAGNOSIS — B1910 Unspecified viral hepatitis B without hepatic coma: Secondary | ICD-10-CM

## 2020-09-02 NOTE — Telephone Encounter (Signed)
Last seen 06/30/2020 by Dr. Laural Golden for Hep b

## 2020-09-03 ENCOUNTER — Other Ambulatory Visit: Payer: Medicare Other

## 2020-09-03 ENCOUNTER — Other Ambulatory Visit: Payer: Self-pay

## 2020-09-04 ENCOUNTER — Other Ambulatory Visit: Payer: Medicare Other

## 2020-09-04 ENCOUNTER — Other Ambulatory Visit: Payer: Self-pay

## 2020-09-04 DIAGNOSIS — Z79899 Other long term (current) drug therapy: Secondary | ICD-10-CM | POA: Diagnosis not present

## 2020-10-27 DIAGNOSIS — F250 Schizoaffective disorder, bipolar type: Secondary | ICD-10-CM | POA: Diagnosis not present

## 2020-11-30 ENCOUNTER — Ambulatory Visit (INDEPENDENT_AMBULATORY_CARE_PROVIDER_SITE_OTHER): Payer: Medicare Other | Admitting: Family Medicine

## 2020-11-30 ENCOUNTER — Other Ambulatory Visit: Payer: Self-pay

## 2020-11-30 ENCOUNTER — Encounter: Payer: Self-pay | Admitting: Family Medicine

## 2020-11-30 VITALS — BP 104/64 | HR 110 | Temp 97.8°F | Resp 20 | Ht 64.0 in | Wt 109.1 lb

## 2020-11-30 DIAGNOSIS — Z1211 Encounter for screening for malignant neoplasm of colon: Secondary | ICD-10-CM

## 2020-11-30 DIAGNOSIS — L659 Nonscarring hair loss, unspecified: Secondary | ICD-10-CM

## 2020-11-30 DIAGNOSIS — F201 Disorganized schizophrenia: Secondary | ICD-10-CM | POA: Diagnosis not present

## 2020-11-30 DIAGNOSIS — Z1231 Encounter for screening mammogram for malignant neoplasm of breast: Secondary | ICD-10-CM | POA: Diagnosis not present

## 2020-11-30 DIAGNOSIS — E559 Vitamin D deficiency, unspecified: Secondary | ICD-10-CM

## 2020-11-30 DIAGNOSIS — R000 Tachycardia, unspecified: Secondary | ICD-10-CM

## 2020-11-30 DIAGNOSIS — Z1212 Encounter for screening for malignant neoplasm of rectum: Secondary | ICD-10-CM

## 2020-11-30 DIAGNOSIS — R634 Abnormal weight loss: Secondary | ICD-10-CM

## 2020-11-30 DIAGNOSIS — B181 Chronic viral hepatitis B without delta-agent: Secondary | ICD-10-CM | POA: Diagnosis not present

## 2020-11-30 DIAGNOSIS — E782 Mixed hyperlipidemia: Secondary | ICD-10-CM | POA: Diagnosis not present

## 2020-11-30 NOTE — Progress Notes (Signed)
Subjective:  [name], female    DOB: 01/10/1971, 50 y.o.   MRN: [id number]  Patient Care Team: Dettinger, Fransisca Kaufmann, MD as PCP - General (Family Medicine) Rogene Houston, MD as Consulting Physician (Gastroenterology)   Chief Complaint:  Annual Exam   HPI: Margaret [name] is a 50 y.o. female presenting on 11/30/2020 for Annual Exam   Pt presents today for preventative health exam and management of chronic medical conditions. She is a resident of [facility] and is accompanied by caregiver today. Pts biggest concern today is weight and hair loss along with increased anxiety and hyperactivity. She denies palpitations, dyspnea, orthopnea, PND, leg swelling, headache, abdominal pain, constipation or diarrhea, fatigue, insomnia, or skin changes. She does complain of slightly increased anxiety. She is followed by psychiatry on a regular  basis and has an upcoming appointment. No appetite change, does eat well daily. She has been taking medications as prescribed without intolerance. She has never been screened for colorectal cancer. Mammogram is ordered. PAP due next year.     Relevant past medical, surgical, family, and social history reviewed and updated as indicated.  Allergies and medications reviewed and updated. Data reviewed: Chart in Epic.   Past Medical History:  Diagnosis Date   Hematuria 11/12/2014   Hepatitis B carrier ([place])    Hypothyroidism    Impulse disorder    IUD (intrauterine device) in place 11/12/2014   Inserted 12/24/12 at [facility]   Mild intellectual disability    Schizophrenia ([place])    Schizophrenia ([place])    Tuberculosis    as a child   Urinary frequency 11/12/2014    Past Surgical History:  Procedure Laterality Date   ARM WOUND REPAIR / CLOSURE     TRACHEAL SURGERY     TRACHEOSTOMY      Social History   Socioeconomic History   Marital status: Single    Spouse name: Not on file   Number of children: 0   Years of education: 12   Highest  education level: High school graduate  Occupational History   Occupation: disabled    Comment: due to schizophrenia  Tobacco Use   Smoking status: Never   Smokeless tobacco: Never  Vaping Use   Vaping Use: Never used  Substance and Sexual Activity   Alcohol use: No    Alcohol/week: 0.0 standard drinks   Drug use: No   Sexual activity: Not Currently    Birth control/protection: I.U.D.  Other Topics Concern   Not on file  Social History Narrative   Patient was adopted. She does not have any children and has never been married. She lives in a group home and is disabled. Has a diagnosis of schizophrenia. She socially isolates herself, doesn't like to participate in group activities, sleeps a lot, has to be prompted to bathe, eat, dress, etc. -07/22/20   Social Determinants of Health   Financial Resource Strain: Low Risk    Difficulty of Paying Living Expenses: Not hard at all  Food Insecurity: No Food Insecurity   Worried About Charity fundraiser in the Last Year: Never true   [place] in the Last Year: Never true  Transportation Needs: No Transportation Needs   Lack of Transportation (Medical): No   Lack of Transportation (Non-Medical): No  Physical Activity: Insufficiently Active   Days of Exercise per Week: 1 day   Minutes of Exercise per Session: 20 min  Stress: Stress Concern Present   Feeling  of Stress : To some extent  Social Connections: Socially Isolated   Frequency of Communication with Friends and Family: More than three times a week   Frequency of Social Gatherings with Friends and Family: More than three times a week   Attends Religious Services: Never   Marine scientist or Organizations: No   Attends Music therapist: Never   Marital Status: Never married  Human resources officer Violence: Not At Risk   Fear of Current or Ex-Partner: No   Emotionally Abused: No   Physically Abused: No   Sexually Abused: No    Outpatient Encounter  Medications as of 11/30/2020  Medication Sig   acetaminophen (TYLENOL) 650 MG CR tablet Take 650 mg by mouth every 8 (eight) hours as needed for pain.   albuterol (VENTOLIN HFA) 108 (90 Base) MCG/ACT inhaler INHALE 2 PUFFS EVERY 4 HOURS AS NEEDED FOR WHEEZING.   benztropine (COGENTIN) 1 MG tablet Take 1 tablet (1 mg total) by mouth 2 (two) times daily.   chlorproMAZINE (THORAZINE) 50 MG tablet Take 50 mg by mouth as needed. One tablet by mouth PRN agitation, max of 2 tablets, 100 mg in 24 hours   Cholecalciferol (VITAMIN D3) 50 MCG (2000 UT) capsule TAKE 1 CAPSULE BY MOUTH EVERY MORNING.   cloZAPine (CLOZARIL) 100 MG tablet Take 250 mg by mouth at bedtime.   DULoxetine (CYMBALTA) 60 MG capsule Take by mouth.   entecavir (BARACLUDE) 0.5 MG tablet TAKE (1) TABLET BY MOUTH ONCE DAILY.   Fish Oil-Cholecalciferol (FISH OIL + D3) 1200-1000 MG-UNIT CAPS TAKE 1 CAPSULE BY MOUTH TWICE DAILY.   FLUoxetine (PROZAC) 20 MG capsule Take 3 capsules (60 mg total) by mouth daily.   fluticasone (FLONASE) 50 MCG/ACT nasal spray SPRAY 2 SPRAYS IN RIGHT NOSTRIL ONLY TWICE DAILY.   ibuprofen (ADVIL) 600 MG tablet TAKE 1 TABLET BY MOUTH WITH FOOD 4 TIMES DAILY AS NEEDED FOR ACTIVE MODERATE PAIN UNRELIEVED BY ACETAMINOPHEN. MAX 2 DOSES/24 HR.   levonorgestrel (MIRENA) 20 MCG/24HR IUD 1 each by Intrauterine route once.   Multiple Vitamins-Minerals (CENTROVITE) TABS TAKE 1 TABLET BY MOUTH AT BEDTIME.   Skin Protectants, Misc. (MINERIN CREME) CREA APPLY MODERATE AMOUNT TOPICALLY TO DRY SKIN ON SOLES & HEELS OF BOTH FEET AFTER SHOWER. (NURSING STAFF TO SUPERVISE APPLICATION)   vitamin E 200 UNIT capsule Take 1 capsule (200 Units total) by mouth daily.   mirtazapine (REMERON) 7.5 MG tablet Take 7.5 mg by mouth at bedtime.   No facility-administered encounter medications on file as of 11/30/2020.    No Known Allergies  Review of Systems  Constitutional:  Positive for unexpected weight change. Negative for activity change,  appetite change, chills, diaphoresis, fatigue and fever.  HENT: Negative.    Eyes: Negative.   Respiratory:  Negative for apnea, cough, choking, chest tightness, shortness of breath, wheezing and stridor.   Cardiovascular:  Negative for chest pain, palpitations and leg swelling.  Gastrointestinal: Negative.   Endocrine: Negative.   Skin:  Negative for color change, pallor, rash and wound.       Thinning hair  Allergic/Immunologic: Negative.   Neurological:  Negative for dizziness, tremors, seizures, syncope, facial asymmetry, speech difficulty, weakness, light-headedness, numbness and headaches.  Hematological: Negative.   Psychiatric/Behavioral:  Positive for agitation, behavioral problems, confusion, decreased concentration, dysphoric mood, hallucinations and sleep disturbance. Negative for self-injury and suicidal ideas. The patient is nervous/anxious and is hyperactive.        Objective:  BP 104/64   Pulse Marland Kitchen)  110   Temp 97.8 F (36.6 C) (Temporal)   Resp 20   Ht 5' 4"  (1.626 m)   Wt 109 lb 1.6 oz (49.5 kg)   SpO2 95%   BMI 18.73 kg/m    Wt Readings from Last 3 Encounters:  11/30/20 109 lb 1.6 oz (49.5 kg)  07/30/20 125 lb (56.7 kg)  07/22/20 123 lb (55.8 kg)    Physical Exam Vitals and nursing note reviewed.  Constitutional:      General: She is not in acute distress.    Appearance: Normal appearance. She is well-developed, well-groomed and normal weight. She is not ill-appearing, toxic-appearing or diaphoretic.  HENT:     Head: Normocephalic and atraumatic.     Jaw: There is normal jaw occlusion.     Right Ear: Hearing, tympanic membrane, ear canal and external ear normal.     Left Ear: Hearing, tympanic membrane, ear canal and external ear normal.     Nose: Nose normal.     Mouth/Throat:     Lips: Pink.     Mouth: Mucous membranes are moist.     Pharynx: Oropharynx is clear. Uvula midline.  Eyes:     General: Lids are normal.     Extraocular Movements:  Extraocular movements intact.     Conjunctiva/sclera: Conjunctivae normal.     Pupils: Pupils are equal, round, and reactive to light.  Neck:     Thyroid: No thyroid mass, thyromegaly or thyroid tenderness.     Vascular: No carotid bruit or JVD.     Trachea: Trachea and phonation normal.  Cardiovascular:     Rate and Rhythm: Regular rhythm. Tachycardia present.     Chest Wall: PMI is not displaced.     Pulses: Normal pulses.     Heart sounds: Normal heart sounds. No murmur heard.   No friction rub. No gallop.  Pulmonary:     Effort: Pulmonary effort is normal. No respiratory distress.     Breath sounds: Normal breath sounds. No wheezing.  Abdominal:     General: Bowel sounds are normal. There is no distension or abdominal bruit.     Palpations: Abdomen is soft. There is no hepatomegaly or splenomegaly.     Tenderness: There is no abdominal tenderness. There is no right CVA tenderness or left CVA tenderness.     Hernia: No hernia is present.  Genitourinary:    Comments: Declined exam. Musculoskeletal:        General: Normal range of motion.     Cervical back: Normal range of motion and neck supple.     Right lower leg: No edema.     Left lower leg: No edema.  Lymphadenopathy:     Cervical: No cervical adenopathy.  Skin:    General: Skin is warm and dry.     Capillary Refill: Capillary refill takes less than 2 seconds.     Coloration: Skin is not cyanotic, jaundiced or pale.     Findings: No rash.  Neurological:     General: No focal deficit present.     Mental Status: She is alert and oriented to person, place, and time.     Cranial Nerves: Cranial nerves are intact. No cranial nerve deficit.     Sensory: Sensation is intact. No sensory deficit.     Motor: Motor function is intact. No weakness.     Coordination: Coordination is intact. Coordination normal.     Gait: Gait is intact. Gait normal.     Deep Tendon  Reflexes: Reflexes are normal and symmetric. Reflexes normal.   Psychiatric:        Attention and Perception: Perception normal. She is inattentive.        Mood and Affect: Affect normal. Mood is anxious.        Speech: Speech is rapid and pressured.        Behavior: Behavior is hyperactive. Behavior is cooperative.        Thought Content: Thought content is paranoid. Thought content does not include homicidal or suicidal ideation. Thought content does not include homicidal or suicidal plan.        Cognition and Memory: Cognition is impaired. Memory is impaired.    Results for orders placed or performed in visit on 07/30/20  Lipid panel  Result Value Ref Range   Cholesterol, Total 164 100 - 199 mg/dL   Triglycerides 45 0 - 149 mg/dL   HDL 58 >39 mg/dL   VLDL Cholesterol Cal 10 5 - 40 mg/dL   LDL Chol Calc (NIH) 96 0 - 99 mg/dL   Chol/HDL Ratio 2.8 0.0 - 4.4 ratio  CMP14+EGFR  Result Value Ref Range   Glucose 89 65 - 99 mg/dL   BUN 10 6 - 24 mg/dL   Creatinine, Ser 0.82 0.57 - 1.00 mg/dL   eGFR 88 >59 mL/min/1.73   BUN/Creatinine Ratio 12 9 - 23   Sodium 145 (H) 134 - 144 mmol/L   Potassium 3.7 3.5 - 5.2 mmol/L   Chloride 104 96 - 106 mmol/L   CO2 26 20 - 29 mmol/L   Calcium 9.1 8.7 - 10.2 mg/dL   Total Protein 6.3 6.0 - 8.5 g/dL   Albumin 4.2 3.8 - 4.8 g/dL   Globulin, Total 2.1 1.5 - 4.5 g/dL   Albumin/Globulin Ratio 2.0 1.2 - 2.2   Bilirubin Total 0.4 0.0 - 1.2 mg/dL   Alkaline Phosphatase 72 44 - 121 IU/L   AST 15 0 - 40 IU/L   ALT 12 0 - 32 IU/L  CBC with Differential/Platelet  Result Value Ref Range   WBC 6.0 3.4 - 10.8 x10E3/uL   RBC 4.07 3.77 - 5.28 x10E6/uL   Hemoglobin 12.1 11.1 - 15.9 g/dL   Hematocrit 36.8 34.0 - 46.6 %   MCV 90 79 - 97 fL   MCH 29.7 26.6 - 33.0 pg   MCHC 32.9 31.5 - 35.7 g/dL   RDW 12.0 11.7 - 15.4 %   Platelets 212 150 - 450 x10E3/uL   Neutrophils 64 Not Estab. %   Lymphs 28 Not Estab. %   Monocytes 5 Not Estab. %   Eos 2 Not Estab. %   Basos 1 Not Estab. %   Neutrophils Absolute 3.8 1.4 - 7.0  x10E3/uL   Lymphocytes Absolute 1.7 0.7 - 3.1 x10E3/uL   Monocytes Absolute 0.3 0.1 - 0.9 x10E3/uL   EOS (ABSOLUTE) 0.1 0.0 - 0.4 x10E3/uL   Basophils Absolute 0.1 0.0 - 0.2 x10E3/uL   Immature Granulocytes 0 Not Estab. %   Immature Grans (Abs) 0.0 0.0 - 0.1 x10E3/uL     EKG in office: ST 109, PR 130 ms, QT 350 ms, no acute ST-T changes or ectopy. Monia Pouch, FNP-C.   Pertinent labs & imaging results that were available during my care of the patient were reviewed by me and considered in my medical decision making.  Assessment & Plan:  Agripina was seen today for annual exam.  Diagnoses and all orders for this visit:  Weight loss, abnormal Hair  loss Weight loss and increased hair loss over last 4 months. Will check thyroid function along with CBC, electrolytes, and Vit D.  -     CBC with Differential/Platelet -     CMP14+EGFR -     Thyroid Panel With TSH -     VITAMIN D 25 Hydroxy (Vit-D Deficiency, Fractures)  Mixed hyperlipidemia Labs pending. Diet and exercise encouraged.  -     Lipid panel  Chronic hepatitis B ([place]) Will check liver enzymes today.  -     CMP14+EGFR  Disorganized schizophrenia ([facility]) Followed by psychiatry on a regular basis. Pt very anxious and hyperactive. Has upcoming visit with psychiatry, may benefit from medication changes. Will check thyroid function.  -     Thyroid Panel With TSH  Vitamin D deficiency Not on repletion, will check labs today.  -     VITAMIN D 25 Hydroxy (Vit-D Deficiency, Fractures)  Tachycardia EKG revealed ST today, no other acute changes. Will check labs for potential underlying causes. Could be adverse effects from medications.  -     CBC with Differential/Platelet -     CMP14+EGFR -     Thyroid Panel With TSH -     EKG 12-Lead  Screening for colorectal cancer No prior screening. Will obtain cologuard.  -     Cologuard  Encounter for screening mammogram for malignant neoplasm of breast Mammogram scheduled.      Continue all other maintenance medications.  Follow up plan: Return in about 3 months (around 03/02/2021), or if symptoms worsen or fail to improve, for PCP for weight loss.   Continue healthy lifestyle choices, including diet (rich in fruits, vegetables, and lean proteins, and low in salt and simple carbohydrates) and exercise (at least 30 minutes of moderate physical activity daily).  Educational handout given for health maintenance  The above assessment and management plan was discussed with the patient. The patient verbalized understanding of and has agreed to the management plan. Patient is aware to call the clinic if they develop any new symptoms or if symptoms persist or worsen. Patient is aware when to return to the clinic for a follow-up visit. Patient educated on when it is appropriate to go to the emergency department.   Monia Pouch, FNP-C [facility] [phone]

## 2020-12-01 LAB — CBC WITH DIFFERENTIAL/PLATELET
Basophils Absolute: 0.1 10*3/uL (ref 0.0–0.2)
Basos: 1 %
EOS (ABSOLUTE): 0.1 10*3/uL (ref 0.0–0.4)
Eos: 1 %
Hematocrit: 35.7 % (ref 34.0–46.6)
Hemoglobin: 11.7 g/dL (ref 11.1–15.9)
Immature Grans (Abs): 0 10*3/uL (ref 0.0–0.1)
Immature Granulocytes: 0 %
Lymphocytes Absolute: 1.5 10*3/uL (ref 0.7–3.1)
Lymphs: 13 %
MCH: 29.5 pg (ref 26.6–33.0)
MCHC: 32.8 g/dL (ref 31.5–35.7)
MCV: 90 fL (ref 79–97)
Monocytes Absolute: 0.5 10*3/uL (ref 0.1–0.9)
Monocytes: 5 %
Neutrophils Absolute: 8.8 10*3/uL — ABNORMAL HIGH (ref 1.4–7.0)
Neutrophils: 80 %
Platelets: 257 10*3/uL (ref 150–450)
RBC: 3.97 x10E6/uL (ref 3.77–5.28)
RDW: 11.2 % — ABNORMAL LOW (ref 11.7–15.4)
WBC: 11 10*3/uL — ABNORMAL HIGH (ref 3.4–10.8)

## 2020-12-01 LAB — THYROID PANEL WITH TSH
Free Thyroxine Index: 1.5 (ref 1.2–4.9)
T3 Uptake Ratio: 24 % (ref 24–39)
T4, Total: 6.1 ug/dL (ref 4.5–12.0)
TSH: 1.64 u[IU]/mL (ref 0.450–4.500)

## 2020-12-01 LAB — CMP14+EGFR
ALT: 15 IU/L (ref 0–32)
AST: 16 IU/L (ref 0–40)
Albumin/Globulin Ratio: 2.4 — ABNORMAL HIGH (ref 1.2–2.2)
Albumin: 4.1 g/dL (ref 3.8–4.8)
Alkaline Phosphatase: 88 IU/L (ref 44–121)
BUN/Creatinine Ratio: 13 (ref 9–23)
BUN: 9 mg/dL (ref 6–24)
Bilirubin Total: 0.4 mg/dL (ref 0.0–1.2)
CO2: 25 mmol/L (ref 20–29)
Calcium: 8.9 mg/dL (ref 8.7–10.2)
Chloride: 107 mmol/L — ABNORMAL HIGH (ref 96–106)
Creatinine, Ser: 0.7 mg/dL (ref 0.57–1.00)
Globulin, Total: 1.7 g/dL (ref 1.5–4.5)
Glucose: 141 mg/dL — ABNORMAL HIGH (ref 65–99)
Potassium: 3.9 mmol/L (ref 3.5–5.2)
Sodium: 144 mmol/L (ref 134–144)
Total Protein: 5.8 g/dL — ABNORMAL LOW (ref 6.0–8.5)
eGFR: 107 mL/min/{1.73_m2} (ref >59)

## 2020-12-01 LAB — LIPID PANEL
Chol/HDL Ratio: 2.6 ratio (ref 0.0–4.4)
Cholesterol, Total: 133 mg/dL (ref 100–199)
HDL: 51 mg/dL (ref >39)
LDL Chol Calc (NIH): 68 mg/dL (ref 0–99)
Triglycerides: 67 mg/dL (ref 0–149)
VLDL Cholesterol Cal: 14 mg/dL (ref 5–40)

## 2020-12-01 LAB — VITAMIN D 25 HYDROXY (VIT D DEFICIENCY, FRACTURES): Vit D, 25-Hydroxy: 79.5 ng/mL (ref 30.0–100.0)

## 2020-12-02 ENCOUNTER — Ambulatory Visit
Admission: RE | Admit: 2020-12-02 | Discharge: 2020-12-02 | Disposition: A | Discharge status: Home / Self Care | Payer: Medicare Other | Source: Ambulatory Visit | Attending: Family Medicine | Admitting: Family Medicine

## 2020-12-02 ENCOUNTER — Inpatient Hospital Stay: Admission: RE | Admit: 2020-12-02 | Payer: Medicare Other | Source: Ambulatory Visit

## 2020-12-02 ENCOUNTER — Other Ambulatory Visit: Payer: Self-pay | Admitting: Family Medicine

## 2020-12-02 ENCOUNTER — Other Ambulatory Visit: Payer: Self-pay

## 2020-12-02 DIAGNOSIS — Z1231 Encounter for screening mammogram for malignant neoplasm of breast: Secondary | ICD-10-CM | POA: Diagnosis not present

## 2020-12-02 IMAGING — MG MM DIGITAL SCREENING BILAT W/ TOMO AND CAD
6 of 10 series · 6 of 30 positions shown · non-contrast
Comparison: Previous exam(s).

CLINICAL DATA: Screening.

EXAM:
DIGITAL SCREENING BILATERAL MAMMOGRAM WITH TOMOSYNTHESIS AND CAD
TECHNIQUE: Bilateral screening digital craniocaudal and mediolateral oblique
mammograms were obtained. Bilateral screening digital breast
tomosynthesis was performed. The images were evaluated with
computer-aided detection.

[L CC synth-2D]
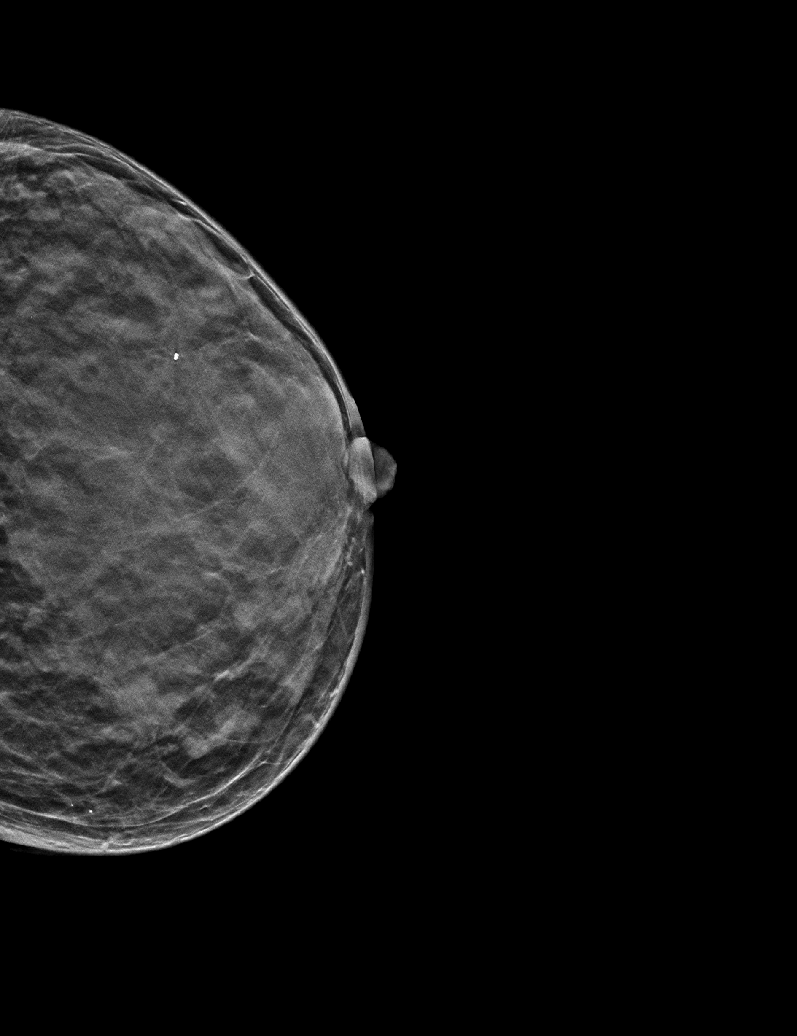

[R CC synth-2D (1 of 2)]
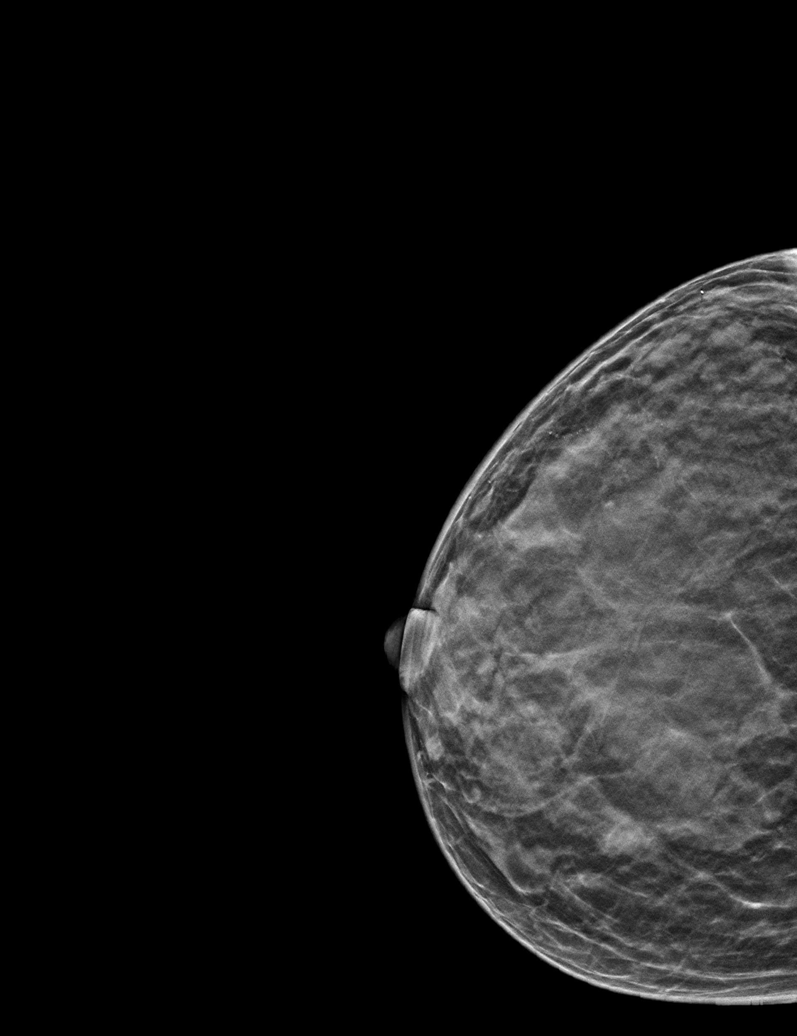

[R MLO synth-2D]
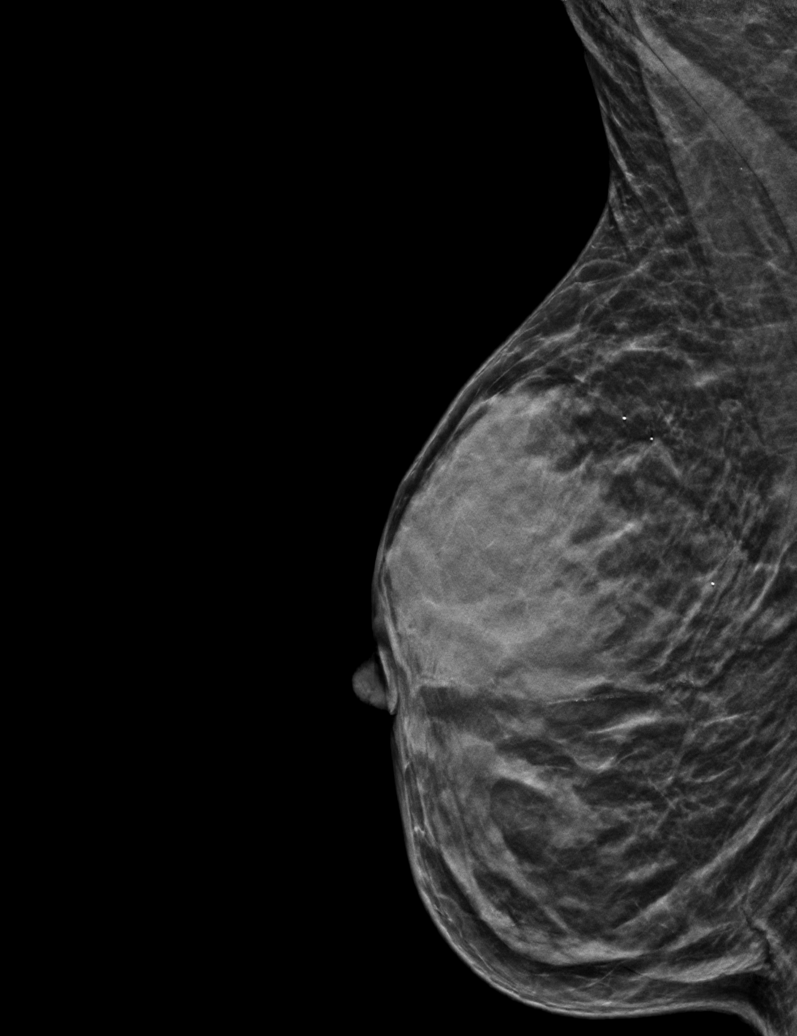

[L MLO synth-2D]
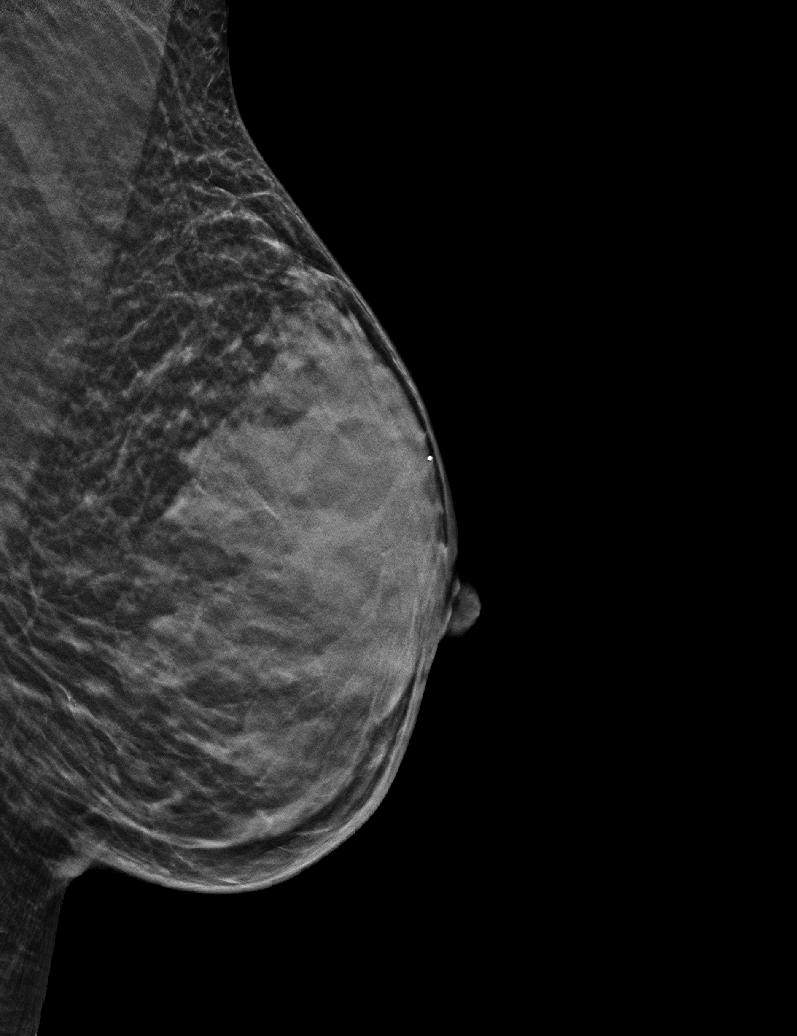

[R CC synth-2D (2 of 2)]
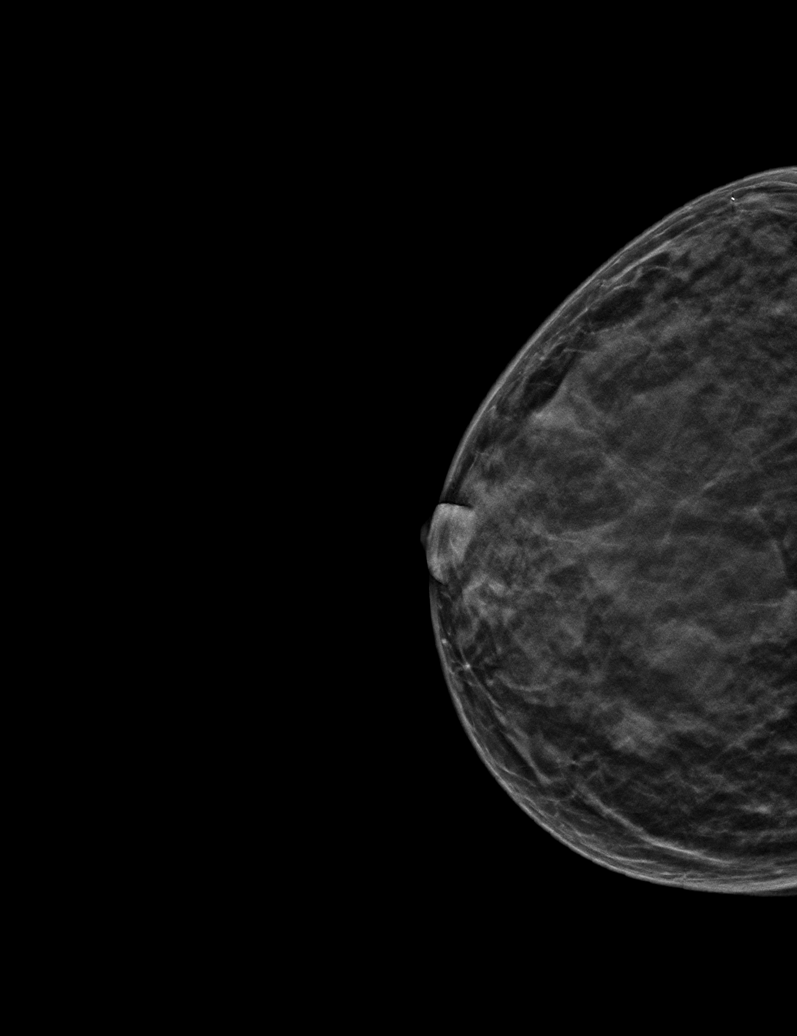

[R CC tomo · tomo slice 17/33.0]
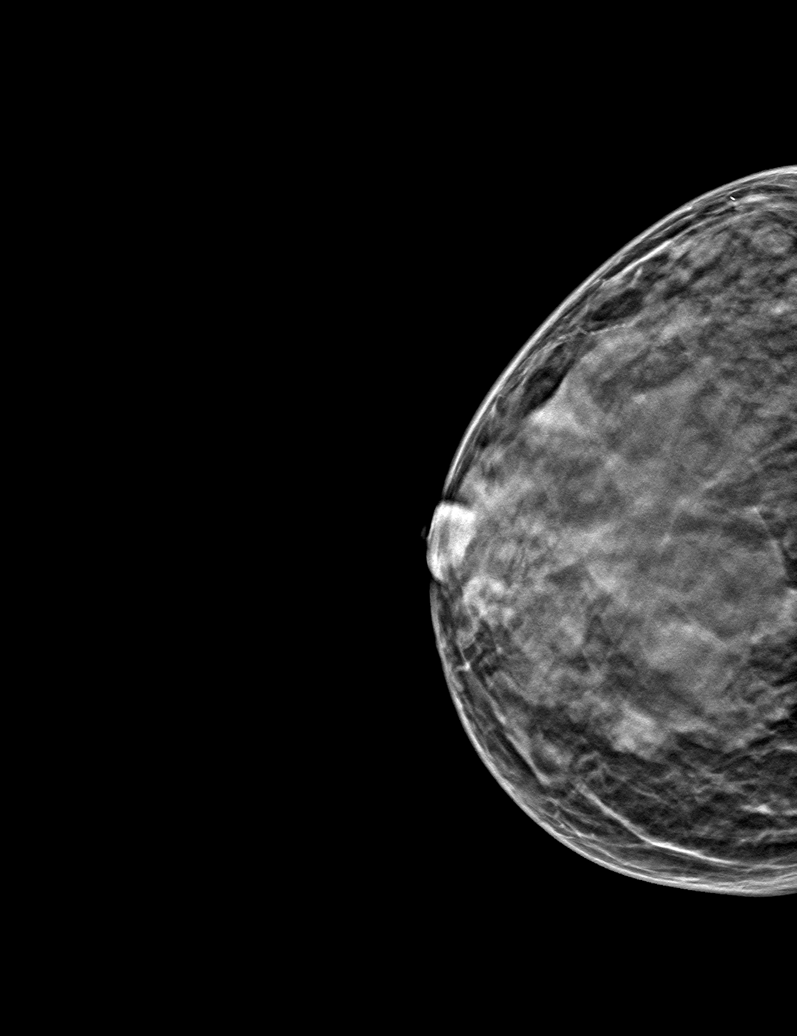

[6 of 30 positions shown; findings below may reference images not displayed]

ACR Breast Density Category d: The breast tissue is extremely dense,
which lowers the sensitivity of mammography
FINDINGS: There are no findings suspicious for malignancy.
IMPRESSION: No mammographic evidence of malignancy. A result letter of this
screening mammogram will be mailed directly to the patient.

RECOMMENDATION:
Screening mammogram in one year. (Code:[7P])

BI-RADS CATEGORY  1: Negative.

## 2020-12-03 ENCOUNTER — Other Ambulatory Visit: Payer: Self-pay | Admitting: Family Medicine

## 2021-01-05 ENCOUNTER — Ambulatory Visit (INDEPENDENT_AMBULATORY_CARE_PROVIDER_SITE_OTHER): Payer: Medicare Other | Admitting: Internal Medicine

## 2021-01-05 ENCOUNTER — Other Ambulatory Visit: Payer: Self-pay

## 2021-01-05 ENCOUNTER — Encounter (INDEPENDENT_AMBULATORY_CARE_PROVIDER_SITE_OTHER): Payer: Self-pay | Admitting: Internal Medicine

## 2021-01-05 VITALS — BP 102/63 | HR 96 | Temp 97.5°F | Ht 64.0 in | Wt 108.4 lb

## 2021-01-05 DIAGNOSIS — B181 Chronic viral hepatitis B without delta-agent: Secondary | ICD-10-CM

## 2021-01-05 DIAGNOSIS — R634 Abnormal weight loss: Secondary | ICD-10-CM | POA: Diagnosis not present

## 2021-01-05 DIAGNOSIS — R1903 Right lower quadrant abdominal swelling, mass and lump: Secondary | ICD-10-CM | POA: Insufficient documentation

## 2021-01-05 DIAGNOSIS — F250 Schizoaffective disorder, bipolar type: Secondary | ICD-10-CM | POA: Diagnosis not present

## 2021-01-05 NOTE — Progress Notes (Signed)
Presenting complaint;  Follow for chronic hepatitis B. History of weight loss.  Database hand subjective:  Patient is 50 year old female who is here for scheduled visit accompanied by Ms. Margot Ables. She has chronic hepatitis B.  Elastography in November 22, 2014 revealed F2 and F3 disease. Liver biopsy in December 2016 revealed chronic hepatitis B with fibrosis around central veins. She was begun on Baraclude/Entecavir in January 2017. HBV DNA became undetectable but she has not cleared the virus.  She weighed 109 pounds at the time of office visit on 06/30/2020.  She has lost 1 pound since her last visit. Patient has no complaints.  Ms. [place] Wilson states her appetite is very good.  She eats 3 meals and couple of snacks every day.  She walks at least a mile every day.  Ms. Redmond Pulling states that while in bed she would constantly move her arms and legs.  Now she is allowed to go to bed when she is ready to sleep and therefore not making constant motion with her extremities. She rarely takes ibuprofen. She denies abdominal pain nausea vomiting or pruritus. We talked about screening for colon carcinoma on her last visit and she wanted to do Cologuard.  She has not provided stool sample to the lab yet.  Current Medications: Outpatient Encounter Medications as of 01/05/2021  Medication Sig   acetaminophen (TYLENOL) 650 MG CR tablet Take 650 mg by mouth every 8 (eight) hours as needed for pain.   albuterol (VENTOLIN HFA) 108 (90 Base) MCG/ACT inhaler INHALE 2 PUFFS EVERY 4 HOURS AS NEEDED FOR WHEEZING.   benztropine (COGENTIN) 1 MG tablet Take 1 tablet (1 mg total) by mouth 2 (two) times daily.   chlorproMAZINE (THORAZINE) 50 MG tablet Take 50 mg by mouth as needed. One tablet by mouth PRN agitation, max of 2 tablets, 100 mg in 24 hours   Cholecalciferol (VITAMIN D3) 50 MCG (2000 UT) capsule TAKE 1 CAPSULE BY MOUTH EVERY MORNING.   cloZAPine (CLOZARIL) 100 MG tablet Take 250 mg by mouth  at bedtime.   DULoxetine (CYMBALTA) 60 MG capsule Take by mouth.   entecavir (BARACLUDE) 0.5 MG tablet TAKE (1) TABLET BY MOUTH ONCE DAILY.   Fish Oil-Cholecalciferol (FISH OIL + D3) 1200-1000 MG-UNIT CAPS TAKE 1 CAPSULE BY MOUTH TWICE DAILY.   FLUoxetine (PROZAC) 20 MG capsule TAKE 3 CAPSULES BY MOUTH ONCE DAILY.   fluticasone (FLONASE) 50 MCG/ACT nasal spray SPRAY 2 SPRAYS IN RIGHT NOSTRIL ONLY TWICE DAILY.   ibuprofen (ADVIL) 600 MG tablet TAKE 1 TABLET BY MOUTH WITH FOOD 4 TIMES DAILY AS NEEDED FOR ACTIVE MODERATE PAIN UNRELIEVED BY ACETAMINOPHEN. MAX 2 DOSES/24 HR.   levonorgestrel (MIRENA) 20 MCG/24HR IUD 1 each by Intrauterine route once.   mirtazapine (REMERON) 7.5 MG tablet Take 7.5 mg by mouth at bedtime.   Multiple Vitamins-Minerals (CENTROVITE) TABS TAKE 1 TABLET BY MOUTH AT BEDTIME.   Skin Protectants, Misc. (MINERIN CREME) CREA APPLY MODERATE AMOUNT TOPICALLY TO DRY SKIN ON SOLES & HEELS OF BOTH FEET AFTER SHOWER. (NURSING STAFF TO SUPERVISE APPLICATION)   vitamin E 200 UNIT capsule Take 1 capsule (200 Units total) by mouth daily.   No facility-administered encounter medications on file as of 01/05/2021.     Objective: Blood pressure 102/63, pulse 96, temperature (!) 97.5 F (36.4 C), temperature source Oral, height 5' 4"  (1.626 m), weight 108 lb 6.4 oz (49.2 kg). Patient is alert and in no acute distress. She is quite thin. Conjunctiva is pink. Sclera is nonicteric Oropharyngeal  mucosa is normal. No neck masses or thyromegaly noted. Cardiac exam with regular rhythm normal S1 and S2. No murmur or gallop noted. Lungs are clear to auscultation. Abdomen is symmetrical.  On palpation abdomen is soft.  There is firm nontender mass in right lower quadrant which is somewhat movable.  No hepatosplenomegaly. No LE edema or clubbing noted.  Labs/studies Results:   CBC Latest Ref Rng & Units 11/30/2020 07/30/2020 06/30/2020  WBC 3.4 - 10.8 x10E3/uL 11.0(H) 6.0 9.0  Hemoglobin 11.1 -  15.9 g/dL 11.7 12.1 13.2  Hematocrit 34.0 - 46.6 % 35.7 36.8 40.3  Platelets 150 - 450 x10E3/uL 257 212 216    CMP Latest Ref Rng & Units 11/30/2020 07/30/2020 06/30/2020  Glucose 65 - 99 mg/dL 141(H) 89 89  BUN 6 - 24 mg/dL 9 10 13   Creatinine 0.57 - 1.00 mg/dL 0.70 0.82 0.73  Sodium 134 - 144 mmol/L 144 145(H) 142  Potassium 3.5 - 5.2 mmol/L 3.9 3.7 4.1  Chloride 96 - 106 mmol/L 107(H) 104 105  CO2 20 - 29 mmol/L 25 26 31   Calcium 8.7 - 10.2 mg/dL 8.9 9.1 9.4  Total Protein 6.0 - 8.5 g/dL 5.8(L) 6.3 6.4  Total Bilirubin 0.0 - 1.2 mg/dL 0.4 0.4 0.5  Alkaline Phos 44 - 121 IU/L 88 72 -  AST 0 - 40 IU/L 16 15 16   ALT 0 - 32 IU/L 15 12 13     Hepatic Function Latest Ref Rng & Units 11/30/2020 07/30/2020 06/30/2020  Total Protein 6.0 - 8.5 g/dL 5.8(L) 6.3 6.4  Albumin 3.8 - 4.8 g/dL 4.1 4.2 -  AST 0 - 40 IU/L 16 15 16   ALT 0 - 32 IU/L 15 12 13   Alk Phosphatase 44 - 121 IU/L 88 72 -  Total Bilirubin 0.0 - 1.2 mg/dL 0.4 0.4 0.5  Bilirubin, Direct 0.1 - 0.5 mg/dL - - -      Assessment:  #1.  Chronic hepatitis B.  This is year 6 on Baraclude/Entecavir HBV DNA levels remain undetected but she has not cleared antigen.  May continue therapy as long as it is effective and she is not having any side effects.  #2.  Right lower quadrant abdominal mass.  She has no GI complaints.  I wonder if she has cecal or appendiceal lesion.  I hope we are dealing with benign neoplasm.  Doubt ovarian mass.  #3.  History of weight loss.  She only has lost 1 pound since her last visit.  She remains with low BMI of 18.61.   Plan:  Abdominal pelvic CT with contrast as soon as possible. HBV DNA by PCR quantitative. Hepatitis B surface antigen. Further recommendations to follow. Office visit in 6 months.

## 2021-01-05 NOTE — Patient Instructions (Signed)
Physician will call with results of blood work and CT when completed

## 2021-01-07 ENCOUNTER — Ambulatory Visit (INDEPENDENT_AMBULATORY_CARE_PROVIDER_SITE_OTHER): Payer: Medicare Other | Admitting: Gastroenterology

## 2021-01-13 ENCOUNTER — Ambulatory Visit (HOSPITAL_BASED_OUTPATIENT_CLINIC_OR_DEPARTMENT_OTHER): Admission: RE | Admit: 2021-01-13 | Payer: Medicare Other | Source: Ambulatory Visit

## 2021-01-14 ENCOUNTER — Other Ambulatory Visit: Payer: Self-pay | Admitting: Family Medicine

## 2021-01-20 ENCOUNTER — Other Ambulatory Visit: Payer: Self-pay

## 2021-01-20 ENCOUNTER — Ambulatory Visit (HOSPITAL_COMMUNITY)
Admission: RE | Admit: 2021-01-20 | Discharge: 2021-01-20 | Disposition: A | Discharge status: Home / Self Care | Payer: Medicare Other | Source: Ambulatory Visit | Attending: Internal Medicine | Admitting: Internal Medicine

## 2021-01-20 ENCOUNTER — Encounter (HOSPITAL_COMMUNITY): Payer: Self-pay

## 2021-01-20 DIAGNOSIS — R1903 Right lower quadrant abdominal swelling, mass and lump: Secondary | ICD-10-CM | POA: Insufficient documentation

## 2021-01-20 DIAGNOSIS — B182 Chronic viral hepatitis C: Secondary | ICD-10-CM | POA: Diagnosis not present

## 2021-01-20 DIAGNOSIS — K6389 Other specified diseases of intestine: Secondary | ICD-10-CM | POA: Diagnosis not present

## 2021-01-20 DIAGNOSIS — B181 Chronic viral hepatitis B without delta-agent: Secondary | ICD-10-CM | POA: Diagnosis not present

## 2021-01-20 DIAGNOSIS — N83201 Unspecified ovarian cyst, right side: Secondary | ICD-10-CM | POA: Diagnosis not present

## 2021-01-20 IMAGING — CT CT ABD-PELV W/ CM
2 of 5 series · 15 of 46 positions shown, 17 images · IV contrast (omnipaque)
Comparison: None.

CLINICAL DATA: Right lower quadrant mass. Chronic hepatitis B.

EXAM:
CT ABDOMEN AND PELVIS WITH CONTRAST
TECHNIQUE: Multidetector CT imaging of the abdomen and pelvis was performed
using the standard protocol following bolus administration of
intravenous contrast.
CONTRAST:  100mL OMNIPAQUE IOHEXOL 300 MG/ML  SOLN

[Series 2: axial st · axial · 0.63mm/px · z∈[+860,+1280]mm · 12 of 96 slices shown, 14 images]
[im 6/96  soft-tissue]
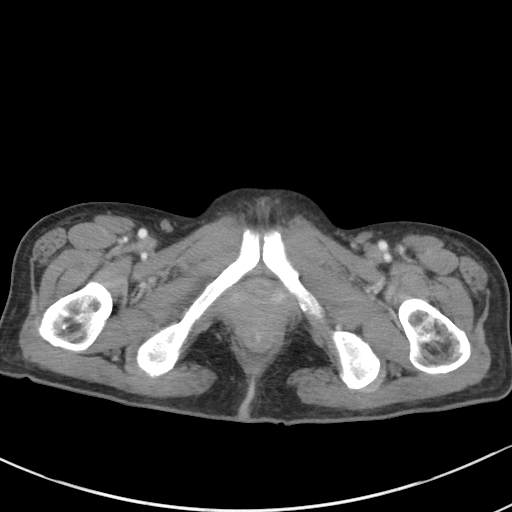
[im 6/96  bone]
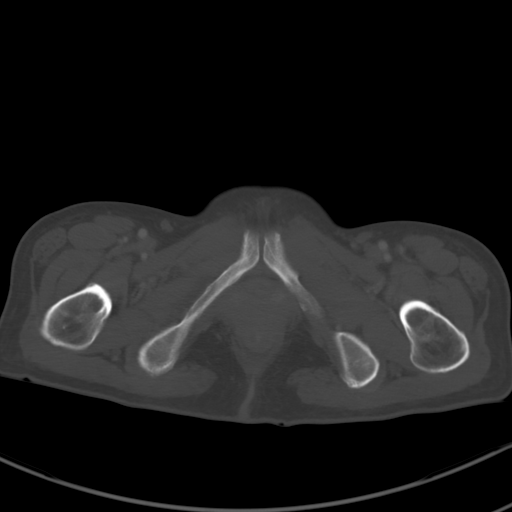
[im 17/96  soft-tissue]
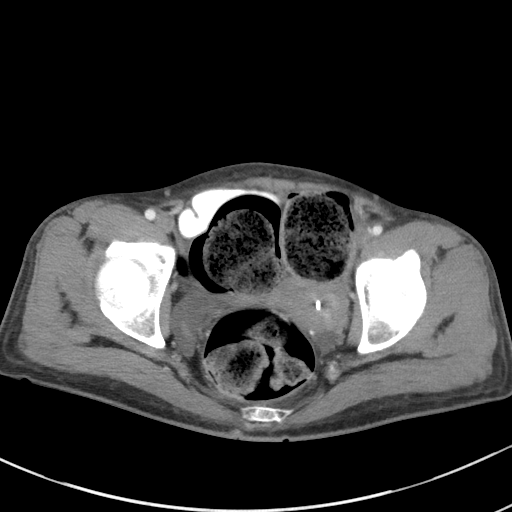
[im 23/96  soft-tissue]
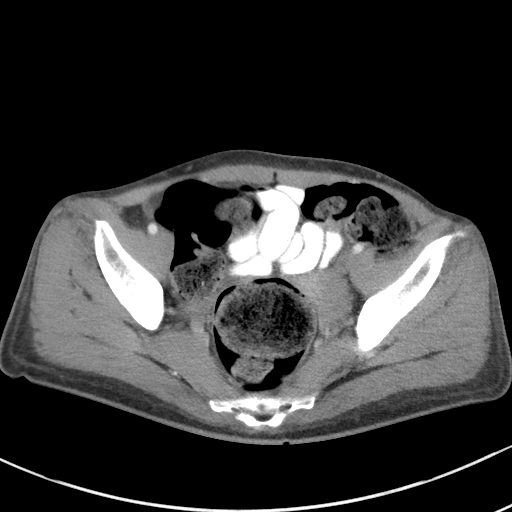
[im 28/96  soft-tissue]
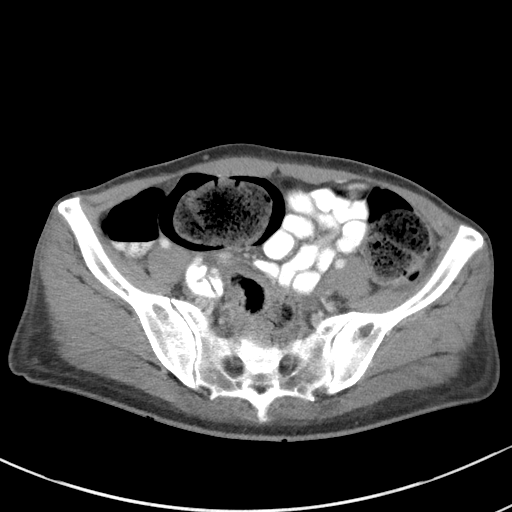
[im 40/96  soft-tissue]
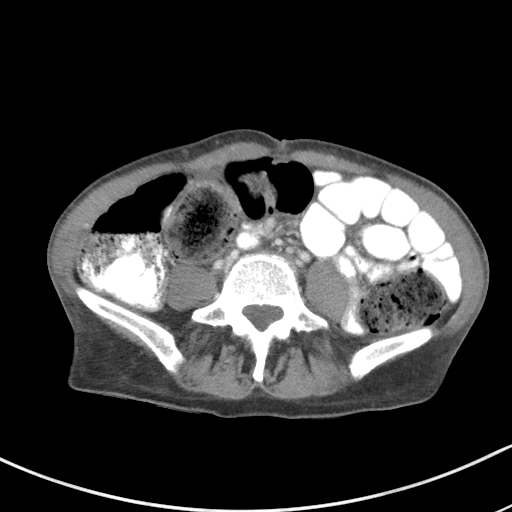
[im 45/96  soft-tissue]
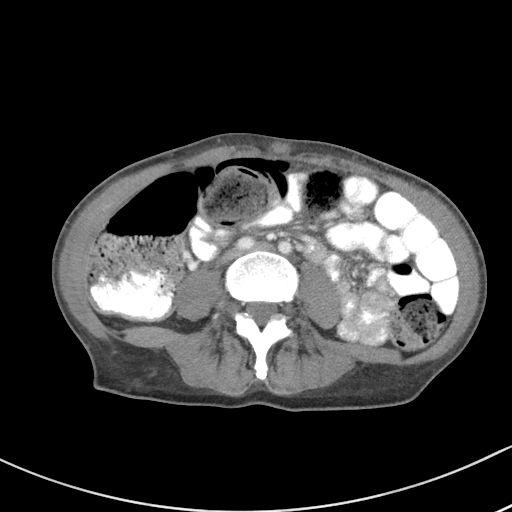
[im 51/96  soft-tissue]
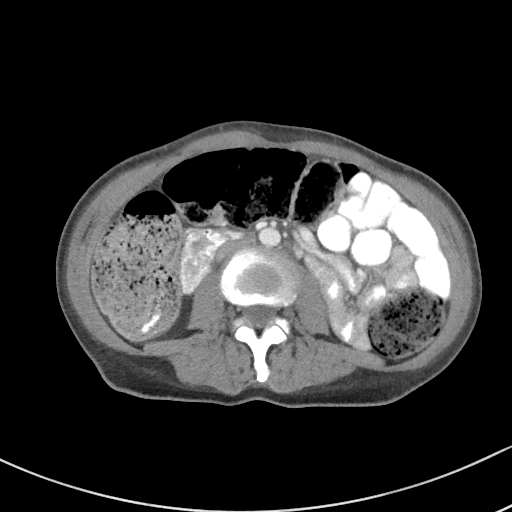
[im 62/96  soft-tissue]
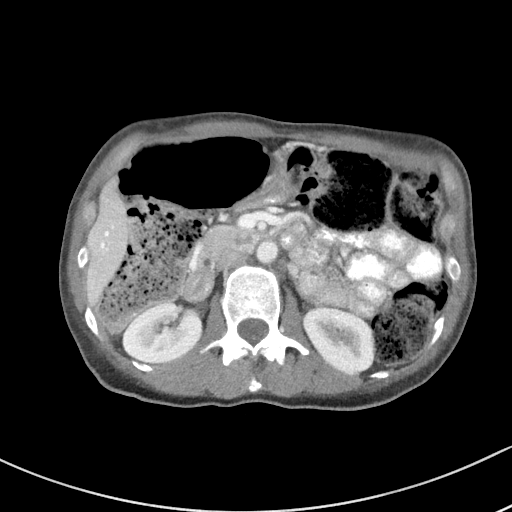
[im 68/96  soft-tissue]
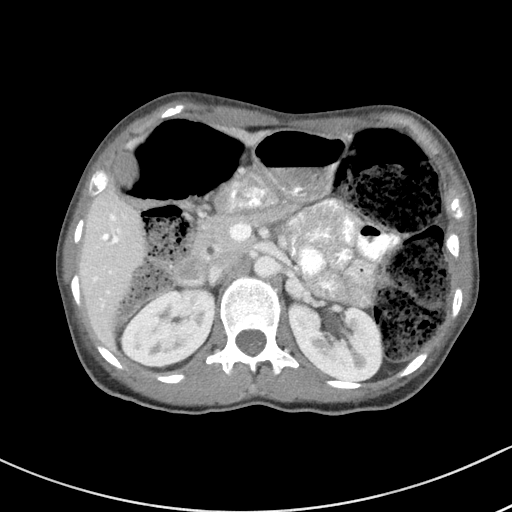
[im 68/96  bone]
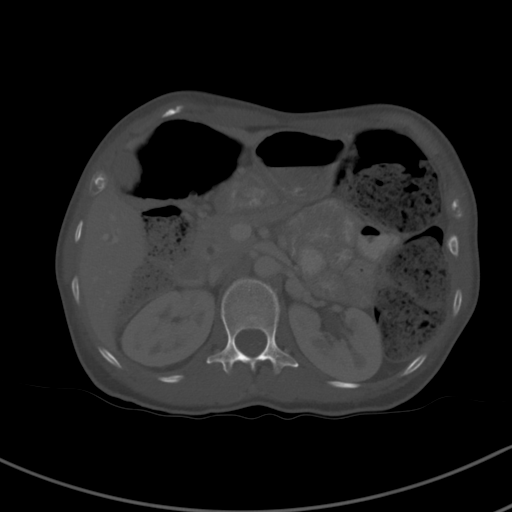
[im 73/96  soft-tissue]
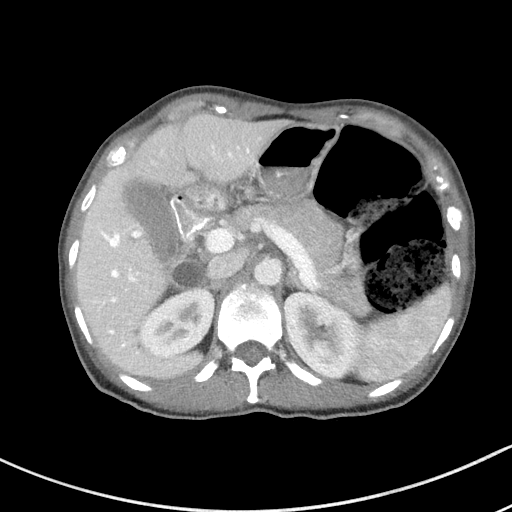
[im 84/96  soft-tissue]
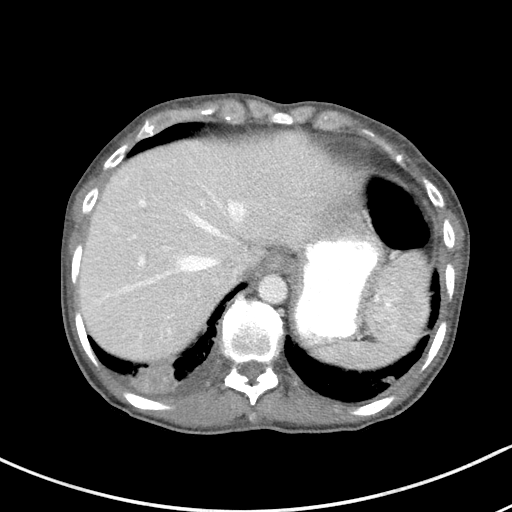
[im 90/96  soft-tissue]
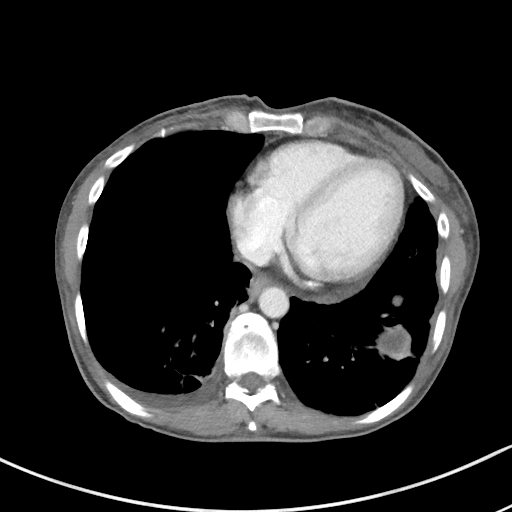

[Series 6: coronal st · coronal · 0.63mm/px · 3 of 74 slices shown]
[im 25/74  soft-tissue]
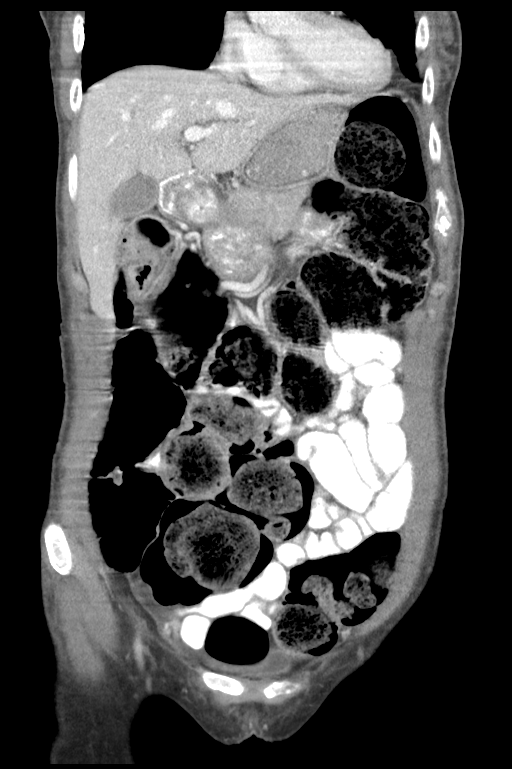
[im 33/74  soft-tissue]
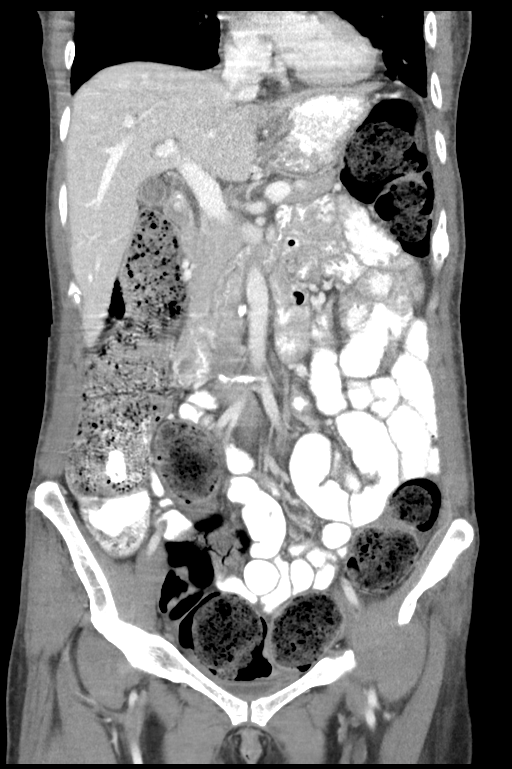
[im 41/74  soft-tissue]
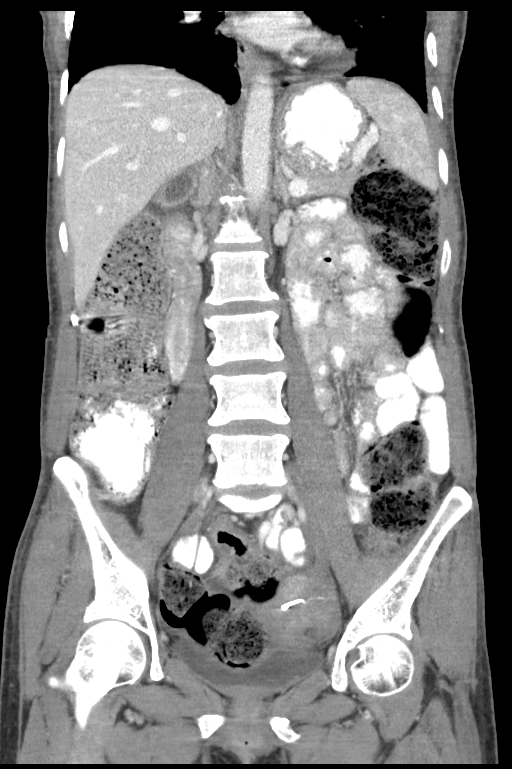

[15 of 46 positions shown; findings below may reference images not displayed]

FINDINGS: Lower Chest: Multiple pulmonary nodules are seen in both lung bases,
measuring up to 3 cm. These are highly suspicious for pulmonary
metastases. Tiny right pleural effusion also noted.

Hepatobiliary: No hepatic masses identified. Increased attenuation
in the gallbladder may be due to sludge or gallstones. No evidence
of cholecystitis or biliary ductal dilatation.

Pancreas:  No mass or inflammatory changes.

Spleen: Within normal limits in size and appearance.

Adrenals/Urinary Tract: No masses identified. No evidence of
ureteral calculi or hydronephrosis.

Stomach/Bowel: Large stool burden is noted throughout the colon. No
masses identified. No evidence of obstruction, inflammatory process
or abnormal fluid collections.

Vascular/Lymphatic: No pathologically enlarged lymph nodes. No acute
vascular findings.

Reproductive: IUD noted. A cystic lesion is noted in the right
adnexal region which measures 3.1 x 1.9 cm on image 79/2. This
appears to show some internal areas of contrast enhancement, and
cystic ovarian neoplasm cannot be excluded. No evidence of ascites.

Other:  None.

Musculoskeletal:  No suspicious bone lesions identified.
IMPRESSION: Multiple pulmonary nodules in both lung bases, highly suspicious for
pulmonary metastases. Consider chest CT with contrast or PET-CT for
further evaluation.

3.1 cm indeterminate cystic lesion in right adnexal region. Cystic
ovarian neoplasm cannot be excluded. Pelvic MRI without and with
contrast is recommended for further characterization.

No evidence of abdominal or pelvic metastatic disease.

Marked diffuse colonic distension by stool.

Gallbladder sludge or gallstones. No radiographic evidence of
cholecystitis or biliary ductal dilatation.

These results will be called to the ordering clinician or
representative by the Radiologist Assistant, and communication
documented in the PACS or [REDACTED].

## 2021-01-20 MED ORDER — IOHEXOL 300 MG/ML  SOLN
100.0000 mL | Freq: Once | INTRAMUSCULAR | Status: AC | PRN
  Administered 2021-01-20: 100 mL via INTRAVENOUS

## 2021-01-21 ENCOUNTER — Other Ambulatory Visit (INDEPENDENT_AMBULATORY_CARE_PROVIDER_SITE_OTHER): Payer: Self-pay | Admitting: Internal Medicine

## 2021-01-21 ENCOUNTER — Telehealth: Payer: Self-pay | Admitting: Family Medicine

## 2021-01-21 MED ORDER — POLYETHYLENE GLYCOL 3350 17 GM/SCOOP PO POWD: 17.0000 g | Freq: Every day | ORAL | 11 refills | Status: DC

## 2021-01-21 NOTE — Telephone Encounter (Signed)
Patient had CT scan with her gastroenterologist that showed possible ovarian complex cysts and concern for possible metastatic disease, please schedule an appointment for her to come into our office as soon as possible so we can discuss this and again plan what we need to do from here.

## 2021-01-22 NOTE — Telephone Encounter (Signed)
Care giver made aware. Needs to call back to schedule appt.   I have made the appt for 10/24 at 4:10.  Please make aware when [place] calls back.

## 2021-01-25 ENCOUNTER — Encounter: Payer: Self-pay | Admitting: Family Medicine

## 2021-01-25 ENCOUNTER — Ambulatory Visit (INDEPENDENT_AMBULATORY_CARE_PROVIDER_SITE_OTHER): Payer: Medicare Other | Admitting: Family Medicine

## 2021-01-25 ENCOUNTER — Other Ambulatory Visit: Payer: Self-pay

## 2021-01-25 VITALS — HR 136 | Wt 107.0 lb

## 2021-01-25 DIAGNOSIS — R918 Other nonspecific abnormal finding of lung field: Secondary | ICD-10-CM | POA: Diagnosis not present

## 2021-01-25 DIAGNOSIS — R634 Abnormal weight loss: Secondary | ICD-10-CM | POA: Diagnosis not present

## 2021-01-25 DIAGNOSIS — N83299 Other ovarian cyst, unspecified side: Secondary | ICD-10-CM | POA: Diagnosis not present

## 2021-01-25 MED ORDER — HYDROXYZINE PAMOATE 25 MG PO CAPS: 25.0000 mg | ORAL_CAPSULE | Freq: Three times a day (TID) | ORAL | 0 refills | Status: DC | PRN

## 2021-01-25 NOTE — Patient Instructions (Signed)
Patient had complex ovarian cyst with multiple pulmonary nodules  Concerning for possible ovarian cancer with metastasis, will do CT chest and refer to oncology.  Sent hydroxyzine to help calm her down that they can use for anxiety until they can get to Dr. Bettina Gavia office.

## 2021-01-25 NOTE — Progress Notes (Signed)
Pulse (!) 136   Wt 107 lb (48.5 kg)   SpO2 93%   BMI 18.37 kg/m    Subjective:   [name], female    DOB: Jun 30, 1970, 50 y.o.   MRN: [id number]  HPI: Margaret [name] is a 50 y.o. female presenting on 01/25/2021 for No chief complaint on file.   HPI Patient has had 20 pound weight loss since April.  She has been seeing gastroenterology and they did a CT abdomen pelvis that showed a complex ovarian cyst with some lower pulmonary nodules.  She was seeing GI for the weight loss and they did a CT scan which found these nodules.  She has recently had atypical breathing and some difficulty walking, they are concerned that this could be related to fear of this possible diagnosis or possibly due to issues with the metastasis.  Relevant past medical, surgical, family and social history reviewed and updated as indicated. Interim medical history since our last visit reviewed. Allergies and medications reviewed and updated.  Review of Systems  Constitutional:  Positive for unexpected weight change. Negative for chills and fever.  Eyes:  Negative for visual disturbance.  Respiratory:  Negative for chest tightness, shortness of breath and wheezing.   Cardiovascular:  Negative for chest pain, palpitations and leg swelling.  Gastrointestinal:  Negative for abdominal pain.  Musculoskeletal:  Positive for gait problem. Negative for back pain.  Skin:  Negative for rash.  Neurological:  Negative for light-headedness and headaches.  Psychiatric/Behavioral:  Negative for agitation and behavioral problems.   All other systems reviewed and are negative.  Per HPI unless specifically indicated above   Allergies as of 01/25/2021   No Known Allergies      Medication List        Accurate as of January 25, 2021  4:24 PM. If you have any questions, ask your nurse or doctor.          acetaminophen 650 MG CR tablet Commonly known as: TYLENOL Take 650 mg by mouth every 8 (eight)  hours as needed for pain.   albuterol 108 (90 Base) MCG/ACT inhaler Commonly known as: Ventolin HFA INHALE 2 PUFFS EVERY 4 HOURS AS NEEDED FOR WHEEZING.   benztropine 1 MG tablet Commonly known as: COGENTIN Take 1 tablet (1 mg total) by mouth 2 (two) times daily.   CENTROVITE Tabs TAKE 1 TABLET BY MOUTH AT BEDTIME.   chlorproMAZINE 50 MG tablet Commonly known as: THORAZINE Take 50 mg by mouth as needed. One tablet by mouth PRN agitation, max of 2 tablets, 100 mg in 24 hours   cloZAPine 100 MG tablet Commonly known as: CLOZARIL Take 250 mg by mouth at bedtime.   DULoxetine 60 MG capsule Commonly known as: CYMBALTA Take by mouth.   entecavir 0.5 MG tablet Commonly known as: BARACLUDE TAKE (1) TABLET BY MOUTH ONCE DAILY.   Fish Oil + D3 1200-1000 MG-UNIT Caps TAKE 1 CAPSULE BY MOUTH TWICE DAILY.   FLUoxetine 20 MG capsule Commonly known as: PROZAC TAKE 3 CAPSULES BY MOUTH ONCE DAILY.   fluticasone 50 MCG/ACT nasal spray Commonly known as: FLONASE SPRAY 2 SPRAYS IN RIGHT NOSTRIL ONLY TWICE DAILY.   ibuprofen 600 MG tablet Commonly known as: ADVIL TAKE 1 TABLET BY MOUTH WITH FOOD 4 TIMES DAILY AS NEEDED FOR ACTIVE MODERATE PAIN UNRELIEVED BY ACETAMINOPHEN. MAX 2 DOSES/24 HR.   levonorgestrel 20 MCG/24HR IUD Commonly known as: MIRENA 1 each by Intrauterine route once.   Minerin Creme Crea APPLY  MODERATE AMOUNT TOPICALLY TO DRY SKIN ON SOLES & HEELS OF BOTH FEET AFTER SHOWER. (NURSING STAFF TO SUPERVISE APPLICATION)   mirtazapine 7.5 MG tablet Commonly known as: REMERON Take 7.5 mg by mouth at bedtime.   polyethylene glycol powder 17 GM/SCOOP powder Commonly known as: GLYCOLAX/MIRALAX Take 17 g by mouth daily.   Vitamin D3 50 MCG (2000 UT) capsule TAKE 1 CAPSULE BY MOUTH EVERY MORNING.   vitamin E 200 UNIT capsule Take 1 capsule (200 Units total) by mouth daily.         Objective:   Pulse (!) 136   Wt 107 lb (48.5 kg)   SpO2 93%   BMI 18.37 kg/m    Wt Readings from Last 3 Encounters:  01/25/21 107 lb (48.5 kg)  01/05/21 108 lb 6.4 oz (49.2 kg)  11/30/20 109 lb 1.6 oz (49.5 kg)    Physical Exam Vitals and nursing note reviewed.  Constitutional:      General: She is not in acute distress.    Appearance: She is well-developed. She is not diaphoretic.  Eyes:     Conjunctiva/sclera: Conjunctivae normal.  Cardiovascular:     Rate and Rhythm: Regular rhythm. Tachycardia present.     Heart sounds: Normal heart sounds. No murmur heard. Pulmonary:     Effort: Pulmonary effort is normal. No respiratory distress.     Breath sounds: Normal breath sounds. No wheezing.     Comments: Short shallow breaths repetitive Abdominal:     General: Abdomen is flat. Bowel sounds are normal. There is no distension.     Palpations: Abdomen is soft. There is no mass.     Tenderness: There is no abdominal tenderness.     Hernia: No hernia is present.  Musculoskeletal:        General: No tenderness. Normal range of motion.     Comments: Repetitive arm movements especially with right arm  Skin:    General: Skin is warm and dry.     Findings: No rash.  Neurological:     Mental Status: She is alert and oriented to person, place, and time.     Coordination: Coordination normal.  Psychiatric:        Behavior: Behavior normal.      Assessment & Plan:   Problem List Items Addressed This Visit       Endocrine   Complex ovarian cyst - Primary   Relevant Orders   Ambulatory referral to Hematology / Oncology     Other   Weight loss   Relevant Orders   Ambulatory referral to Hematology / Oncology   Multiple pulmonary nodules   Relevant Orders   CT Chest W Contrast   Ambulatory referral to Hematology / Oncology    Patient had CT abdomen that showed lower pulmonary nodules and complex ovarian cyst, will do CT chest and refer to oncology.  Patient has had 20 pound weight loss in the past 6 months and it was actually gastroenterology that found  these nodules on the CT chest.  Sent hydroxyzine to help calm her down from her anxiety  Patient does have a regular breathing and overactive arm movements, likely from stress, she does have a psychiatrist, recommended that they get an appointment with him ASAP. Follow up plan: Return if symptoms worsen or fail to improve.  Counseling provided for all of the vaccine components Orders Placed This Encounter  Procedures   CT Chest W Contrast   Ambulatory referral to Hematology / Oncology  Caryl Pina, MD [facility] 01/25/2021, 4:24 PM

## 2021-01-26 ENCOUNTER — Other Ambulatory Visit: Payer: Medicare Other

## 2021-01-28 ENCOUNTER — Ambulatory Visit: Payer: Medicare Other | Admitting: Family Medicine

## 2021-01-29 ENCOUNTER — Encounter (HOSPITAL_COMMUNITY): Payer: Self-pay | Admitting: Emergency Medicine

## 2021-01-29 ENCOUNTER — Other Ambulatory Visit: Payer: Self-pay

## 2021-01-29 ENCOUNTER — Inpatient Hospital Stay (HOSPITAL_COMMUNITY)
Admission: EM | Admit: 2021-01-29 | Discharge: 2021-02-18 | DRG: 177 | Disposition: A | Discharge status: Home / Self Care | Payer: Medicare Other | Attending: Student | Admitting: Student

## 2021-01-29 DIAGNOSIS — R000 Tachycardia, unspecified: Secondary | ICD-10-CM | POA: Diagnosis not present

## 2021-01-29 DIAGNOSIS — R918 Other nonspecific abnormal finding of lung field: Secondary | ICD-10-CM | POA: Diagnosis not present

## 2021-01-29 DIAGNOSIS — Z20822 Contact with and (suspected) exposure to covid-19: Secondary | ICD-10-CM | POA: Diagnosis present

## 2021-01-29 DIAGNOSIS — E039 Hypothyroidism, unspecified: Secondary | ICD-10-CM | POA: Diagnosis present

## 2021-01-29 DIAGNOSIS — R0902 Hypoxemia: Secondary | ICD-10-CM | POA: Diagnosis not present

## 2021-01-29 DIAGNOSIS — D6489 Other specified anemias: Secondary | ICD-10-CM | POA: Diagnosis present

## 2021-01-29 DIAGNOSIS — D638 Anemia in other chronic diseases classified elsewhere: Secondary | ICD-10-CM | POA: Diagnosis present

## 2021-01-29 DIAGNOSIS — R1310 Dysphagia, unspecified: Secondary | ICD-10-CM | POA: Diagnosis present

## 2021-01-29 DIAGNOSIS — E43 Unspecified severe protein-calorie malnutrition: Secondary | ICD-10-CM | POA: Diagnosis present

## 2021-01-29 DIAGNOSIS — J90 Pleural effusion, not elsewhere classified: Secondary | ICD-10-CM

## 2021-01-29 DIAGNOSIS — G9341 Metabolic encephalopathy: Secondary | ICD-10-CM | POA: Diagnosis not present

## 2021-01-29 DIAGNOSIS — I3139 Other pericardial effusion (noninflammatory): Secondary | ICD-10-CM | POA: Diagnosis not present

## 2021-01-29 DIAGNOSIS — E279 Disorder of adrenal gland, unspecified: Secondary | ICD-10-CM | POA: Diagnosis present

## 2021-01-29 DIAGNOSIS — D72829 Elevated white blood cell count, unspecified: Secondary | ICD-10-CM | POA: Diagnosis present

## 2021-01-29 DIAGNOSIS — E876 Hypokalemia: Secondary | ICD-10-CM | POA: Diagnosis present

## 2021-01-29 DIAGNOSIS — J189 Pneumonia, unspecified organism: Secondary | ICD-10-CM | POA: Diagnosis not present

## 2021-01-29 DIAGNOSIS — J432 Centrilobular emphysema: Secondary | ICD-10-CM | POA: Diagnosis present

## 2021-01-29 DIAGNOSIS — Z9689 Presence of other specified functional implants: Secondary | ICD-10-CM

## 2021-01-29 DIAGNOSIS — Z975 Presence of (intrauterine) contraceptive device: Secondary | ICD-10-CM

## 2021-01-29 DIAGNOSIS — N83299 Other ovarian cyst, unspecified side: Secondary | ICD-10-CM | POA: Diagnosis present

## 2021-01-29 DIAGNOSIS — J810 Acute pulmonary edema: Secondary | ICD-10-CM | POA: Diagnosis present

## 2021-01-29 DIAGNOSIS — I700 Atherosclerosis of aorta: Secondary | ICD-10-CM | POA: Diagnosis not present

## 2021-01-29 DIAGNOSIS — J329 Chronic sinusitis, unspecified: Secondary | ICD-10-CM | POA: Diagnosis present

## 2021-01-29 DIAGNOSIS — R0600 Dyspnea, unspecified: Secondary | ICD-10-CM

## 2021-01-29 DIAGNOSIS — R1084 Generalized abdominal pain: Secondary | ICD-10-CM | POA: Diagnosis not present

## 2021-01-29 DIAGNOSIS — B181 Chronic viral hepatitis B without delta-agent: Secondary | ICD-10-CM | POA: Diagnosis present

## 2021-01-29 DIAGNOSIS — J690 Pneumonitis due to inhalation of food and vomit: Secondary | ICD-10-CM | POA: Diagnosis not present

## 2021-01-29 DIAGNOSIS — R0689 Other abnormalities of breathing: Secondary | ICD-10-CM | POA: Diagnosis not present

## 2021-01-29 DIAGNOSIS — Z4682 Encounter for fitting and adjustment of non-vascular catheter: Secondary | ICD-10-CM

## 2021-01-29 DIAGNOSIS — J9601 Acute respiratory failure with hypoxia: Secondary | ICD-10-CM | POA: Diagnosis not present

## 2021-01-29 DIAGNOSIS — R262 Difficulty in walking, not elsewhere classified: Secondary | ICD-10-CM | POA: Diagnosis present

## 2021-01-29 DIAGNOSIS — R6250 Unspecified lack of expected normal physiological development in childhood: Secondary | ICD-10-CM | POA: Diagnosis present

## 2021-01-29 DIAGNOSIS — F209 Schizophrenia, unspecified: Secondary | ICD-10-CM | POA: Diagnosis present

## 2021-01-29 DIAGNOSIS — F70 Mild intellectual disabilities: Secondary | ICD-10-CM | POA: Diagnosis present

## 2021-01-29 DIAGNOSIS — Z681 Body mass index (BMI) 19 or less, adult: Secondary | ICD-10-CM

## 2021-01-29 DIAGNOSIS — J918 Pleural effusion in other conditions classified elsewhere: Secondary | ICD-10-CM | POA: Diagnosis present

## 2021-01-29 DIAGNOSIS — J9811 Atelectasis: Secondary | ICD-10-CM | POA: Diagnosis not present

## 2021-01-29 DIAGNOSIS — G4489 Other headache syndrome: Secondary | ICD-10-CM | POA: Diagnosis not present

## 2021-01-29 DIAGNOSIS — N83291 Other ovarian cyst, right side: Secondary | ICD-10-CM | POA: Diagnosis present

## 2021-01-29 DIAGNOSIS — R911 Solitary pulmonary nodule: Secondary | ICD-10-CM | POA: Diagnosis not present

## 2021-01-29 DIAGNOSIS — R970 Elevated carcinoembryonic antigen [CEA]: Secondary | ICD-10-CM | POA: Diagnosis present

## 2021-01-29 DIAGNOSIS — J439 Emphysema, unspecified: Secondary | ICD-10-CM | POA: Diagnosis not present

## 2021-01-29 DIAGNOSIS — J869 Pyothorax without fistula: Secondary | ICD-10-CM | POA: Diagnosis not present

## 2021-01-29 DIAGNOSIS — Z79899 Other long term (current) drug therapy: Secondary | ICD-10-CM

## 2021-01-29 DIAGNOSIS — Z8611 Personal history of tuberculosis: Secondary | ICD-10-CM

## 2021-01-29 NOTE — ED Triage Notes (Signed)
Pt arrives via [facility] from [facility]. Pt recently dx with ovarian CA with mets to lower lungs. Pt has not been told that she has CA, and is currently ward of the state.  Pt c/o lower abd pain and headache today.

## 2021-01-30 ENCOUNTER — Emergency Department (HOSPITAL_COMMUNITY): Payer: Medicare Other

## 2021-01-30 DIAGNOSIS — R0902 Hypoxemia: Secondary | ICD-10-CM | POA: Diagnosis not present

## 2021-01-30 DIAGNOSIS — J449 Chronic obstructive pulmonary disease, unspecified: Secondary | ICD-10-CM | POA: Diagnosis not present

## 2021-01-30 DIAGNOSIS — F99 Mental disorder, not otherwise specified: Secondary | ICD-10-CM

## 2021-01-30 DIAGNOSIS — R6250 Unspecified lack of expected normal physiological development in childhood: Secondary | ICD-10-CM | POA: Diagnosis present

## 2021-01-30 DIAGNOSIS — E278 Other specified disorders of adrenal gland: Secondary | ICD-10-CM | POA: Diagnosis not present

## 2021-01-30 DIAGNOSIS — D72829 Elevated white blood cell count, unspecified: Secondary | ICD-10-CM | POA: Diagnosis not present

## 2021-01-30 DIAGNOSIS — R1310 Dysphagia, unspecified: Secondary | ICD-10-CM | POA: Diagnosis present

## 2021-01-30 DIAGNOSIS — J189 Pneumonia, unspecified organism: Secondary | ICD-10-CM | POA: Diagnosis present

## 2021-01-30 DIAGNOSIS — Z8709 Personal history of other diseases of the respiratory system: Secondary | ICD-10-CM | POA: Diagnosis not present

## 2021-01-30 DIAGNOSIS — J90 Pleural effusion, not elsewhere classified: Secondary | ICD-10-CM | POA: Diagnosis present

## 2021-01-30 DIAGNOSIS — J810 Acute pulmonary edema: Secondary | ICD-10-CM | POA: Diagnosis not present

## 2021-01-30 DIAGNOSIS — R911 Solitary pulmonary nodule: Secondary | ICD-10-CM | POA: Diagnosis not present

## 2021-01-30 DIAGNOSIS — J9811 Atelectasis: Secondary | ICD-10-CM | POA: Diagnosis not present

## 2021-01-30 DIAGNOSIS — Z4682 Encounter for fitting and adjustment of non-vascular catheter: Secondary | ICD-10-CM | POA: Diagnosis not present

## 2021-01-30 DIAGNOSIS — R0602 Shortness of breath: Secondary | ICD-10-CM | POA: Diagnosis not present

## 2021-01-30 DIAGNOSIS — F209 Schizophrenia, unspecified: Secondary | ICD-10-CM | POA: Diagnosis present

## 2021-01-30 DIAGNOSIS — N83291 Other ovarian cyst, right side: Secondary | ICD-10-CM | POA: Diagnosis present

## 2021-01-30 DIAGNOSIS — I700 Atherosclerosis of aorta: Secondary | ICD-10-CM | POA: Diagnosis not present

## 2021-01-30 DIAGNOSIS — Z20822 Contact with and (suspected) exposure to covid-19: Secondary | ICD-10-CM | POA: Diagnosis present

## 2021-01-30 DIAGNOSIS — R918 Other nonspecific abnormal finding of lung field: Secondary | ICD-10-CM | POA: Diagnosis not present

## 2021-01-30 DIAGNOSIS — J948 Other specified pleural conditions: Secondary | ICD-10-CM | POA: Diagnosis not present

## 2021-01-30 DIAGNOSIS — B1910 Unspecified viral hepatitis B without hepatic coma: Secondary | ICD-10-CM | POA: Diagnosis not present

## 2021-01-30 DIAGNOSIS — R262 Difficulty in walking, not elsewhere classified: Secondary | ICD-10-CM | POA: Diagnosis present

## 2021-01-30 DIAGNOSIS — R0600 Dyspnea, unspecified: Secondary | ICD-10-CM | POA: Diagnosis not present

## 2021-01-30 DIAGNOSIS — J432 Centrilobular emphysema: Secondary | ICD-10-CM | POA: Diagnosis present

## 2021-01-30 DIAGNOSIS — D638 Anemia in other chronic diseases classified elsewhere: Secondary | ICD-10-CM | POA: Diagnosis present

## 2021-01-30 DIAGNOSIS — E43 Unspecified severe protein-calorie malnutrition: Secondary | ICD-10-CM | POA: Diagnosis present

## 2021-01-30 DIAGNOSIS — R410 Disorientation, unspecified: Secondary | ICD-10-CM | POA: Diagnosis not present

## 2021-01-30 DIAGNOSIS — N83299 Other ovarian cyst, unspecified side: Secondary | ICD-10-CM | POA: Diagnosis not present

## 2021-01-30 DIAGNOSIS — Z681 Body mass index (BMI) 19 or less, adult: Secondary | ICD-10-CM | POA: Diagnosis not present

## 2021-01-30 DIAGNOSIS — E876 Hypokalemia: Secondary | ICD-10-CM | POA: Diagnosis present

## 2021-01-30 DIAGNOSIS — J9601 Acute respiratory failure with hypoxia: Secondary | ICD-10-CM | POA: Diagnosis not present

## 2021-01-30 DIAGNOSIS — J329 Chronic sinusitis, unspecified: Secondary | ICD-10-CM | POA: Diagnosis present

## 2021-01-30 DIAGNOSIS — Z8611 Personal history of tuberculosis: Secondary | ICD-10-CM | POA: Diagnosis not present

## 2021-01-30 DIAGNOSIS — E039 Hypothyroidism, unspecified: Secondary | ICD-10-CM | POA: Diagnosis present

## 2021-01-30 DIAGNOSIS — D72824 Basophilia: Secondary | ICD-10-CM | POA: Diagnosis not present

## 2021-01-30 DIAGNOSIS — R970 Elevated carcinoembryonic antigen [CEA]: Secondary | ICD-10-CM | POA: Diagnosis present

## 2021-01-30 DIAGNOSIS — Z9689 Presence of other specified functional implants: Secondary | ICD-10-CM | POA: Diagnosis not present

## 2021-01-30 DIAGNOSIS — I3139 Other pericardial effusion (noninflammatory): Secondary | ICD-10-CM | POA: Diagnosis not present

## 2021-01-30 DIAGNOSIS — D649 Anemia, unspecified: Secondary | ICD-10-CM | POA: Diagnosis not present

## 2021-01-30 DIAGNOSIS — J690 Pneumonitis due to inhalation of food and vomit: Secondary | ICD-10-CM | POA: Diagnosis present

## 2021-01-30 DIAGNOSIS — D75839 Thrombocytosis, unspecified: Secondary | ICD-10-CM | POA: Diagnosis not present

## 2021-01-30 DIAGNOSIS — B181 Chronic viral hepatitis B without delta-agent: Secondary | ICD-10-CM | POA: Diagnosis present

## 2021-01-30 DIAGNOSIS — J849 Interstitial pulmonary disease, unspecified: Secondary | ICD-10-CM | POA: Diagnosis not present

## 2021-01-30 DIAGNOSIS — J869 Pyothorax without fistula: Secondary | ICD-10-CM | POA: Diagnosis present

## 2021-01-30 DIAGNOSIS — F201 Disorganized schizophrenia: Secondary | ICD-10-CM | POA: Diagnosis not present

## 2021-01-30 DIAGNOSIS — J439 Emphysema, unspecified: Secondary | ICD-10-CM | POA: Diagnosis not present

## 2021-01-30 DIAGNOSIS — D6489 Other specified anemias: Secondary | ICD-10-CM | POA: Diagnosis present

## 2021-01-30 DIAGNOSIS — R109 Unspecified abdominal pain: Secondary | ICD-10-CM | POA: Diagnosis not present

## 2021-01-30 DIAGNOSIS — J918 Pleural effusion in other conditions classified elsewhere: Secondary | ICD-10-CM | POA: Diagnosis present

## 2021-01-30 DIAGNOSIS — R079 Chest pain, unspecified: Secondary | ICD-10-CM | POA: Diagnosis not present

## 2021-01-30 DIAGNOSIS — F70 Mild intellectual disabilities: Secondary | ICD-10-CM | POA: Diagnosis present

## 2021-01-30 DIAGNOSIS — J984 Other disorders of lung: Secondary | ICD-10-CM | POA: Diagnosis not present

## 2021-01-30 DIAGNOSIS — E279 Disorder of adrenal gland, unspecified: Secondary | ICD-10-CM | POA: Diagnosis present

## 2021-01-30 DIAGNOSIS — G9341 Metabolic encephalopathy: Secondary | ICD-10-CM | POA: Diagnosis not present

## 2021-01-30 LAB — URINALYSIS, ROUTINE W REFLEX MICROSCOPIC
Bacteria, UA: NONE SEEN
Bilirubin Urine: NEGATIVE
Glucose, UA: NEGATIVE mg/dL
Hgb urine dipstick: NEGATIVE
Ketones, ur: NEGATIVE mg/dL
Leukocytes,Ua: NEGATIVE
Nitrite: POSITIVE — AB
Protein, ur: NEGATIVE mg/dL
Specific Gravity, Urine: 1.043 — ABNORMAL HIGH (ref 1.005–1.030)
pH: 6 (ref 5.0–8.0)

## 2021-01-30 LAB — COMPREHENSIVE METABOLIC PANEL
ALT: 22 U/L (ref 0–44)
AST: 19 U/L (ref 15–41)
Albumin: 2.1 g/dL — ABNORMAL LOW (ref 3.5–5.0)
Alkaline Phosphatase: 87 U/L (ref 38–126)
Anion gap: 7 (ref 5–15)
BUN: 9 mg/dL (ref 6–20)
CO2: 28 mmol/L (ref 22–32)
Calcium: 8.3 mg/dL — ABNORMAL LOW (ref 8.9–10.3)
Chloride: 98 mmol/L (ref 98–111)
Creatinine, Ser: 0.61 mg/dL (ref 0.44–1.00)
GFR, Estimated: >60 mL/min (ref >60)
Glucose, Bld: 137 mg/dL — ABNORMAL HIGH (ref 70–99)
Potassium: 3.2 mmol/L — ABNORMAL LOW (ref 3.5–5.1)
Sodium: 133 mmol/L — ABNORMAL LOW (ref 135–145)
Total Bilirubin: 0.3 mg/dL (ref 0.3–1.2)
Total Protein: 5.6 g/dL — ABNORMAL LOW (ref 6.5–8.1)

## 2021-01-30 LAB — CBC
HCT: 29.5 % — ABNORMAL LOW (ref 36.0–46.0)
Hemoglobin: 9.3 g/dL — ABNORMAL LOW (ref 12.0–15.0)
MCH: 27.6 pg (ref 26.0–34.0)
MCHC: 31.5 g/dL (ref 30.0–36.0)
MCV: 87.5 fL (ref 80.0–100.0)
Platelets: 356 10*3/uL (ref 150–400)
RBC: 3.37 MIL/uL — ABNORMAL LOW (ref 3.87–5.11)
RDW: 13 % (ref 11.5–15.5)
WBC: 31.9 10*3/uL — ABNORMAL HIGH (ref 4.0–10.5)
nRBC: 0 % (ref 0.0–0.2)

## 2021-01-30 LAB — RESP PANEL BY RT-PCR (FLU A&B, COVID) ARPGX2
Influenza A by PCR: NEGATIVE
Influenza B by PCR: NEGATIVE
SARS Coronavirus 2 by RT PCR: NEGATIVE

## 2021-01-30 LAB — LIPASE, BLOOD: Lipase: 26 U/L (ref 11–51)

## 2021-01-30 LAB — LACTIC ACID, PLASMA
Lactic Acid, Venous: 0.7 mmol/L (ref 0.5–1.9)
Lactic Acid, Venous: 0.9 mmol/L (ref 0.5–1.9)

## 2021-01-30 LAB — HCG, QUANTITATIVE, PREGNANCY: hCG, Beta Chain, Quant, S: <1 m[IU]/mL (ref <5)

## 2021-01-30 IMAGING — CT CT CHEST W/ CM
2 of 6 series · 14 of 36 positions shown, 18 images · IV contrast (Omnipaque or Isovue)
Comparison: CT abdomen pelvis [DATE]

CLINICAL DATA: Multiple pulmonary nodules suspicious for pulmonary
metastatic disease.

EXAM:
CT CHEST WITH CONTRAST
TECHNIQUE: Multidetector CT imaging of the chest was performed during
intravenous contrast administration.
CONTRAST:  60mL OMNIPAQUE IOHEXOL 350 MG/ML SOLN

[Series 2: routine chest with · axial · 0.58mm/px · z∈[+1160,+1422]mm · 13 of 145 slices shown, 17 images]
[im 7/145  mediastinal]
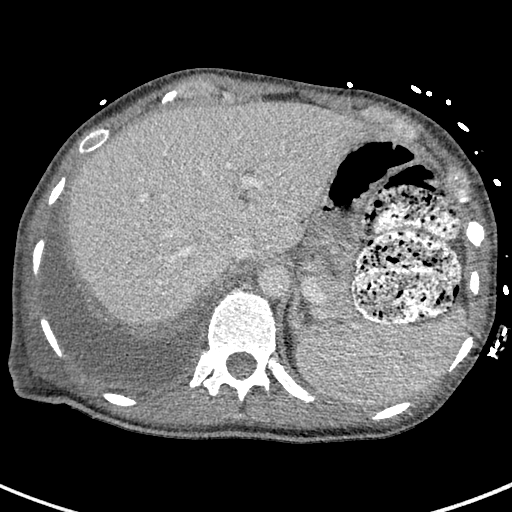
[im 7/145  lung]
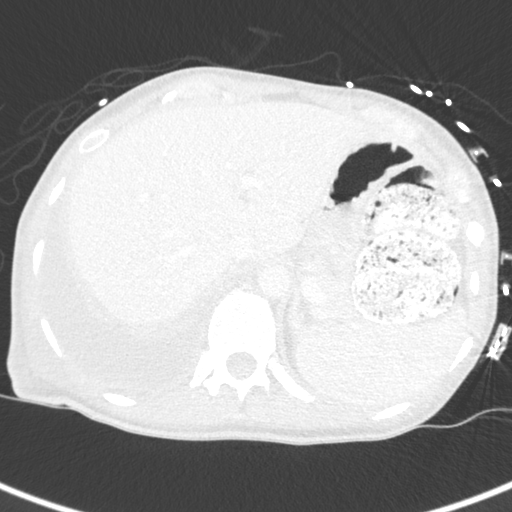
[im 20/145  lung]
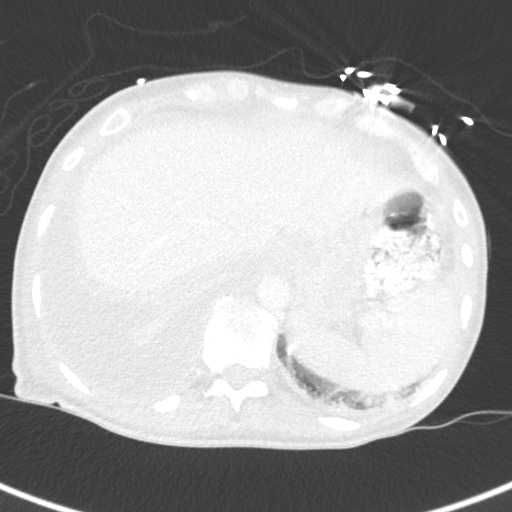
[im 33/145  lung]
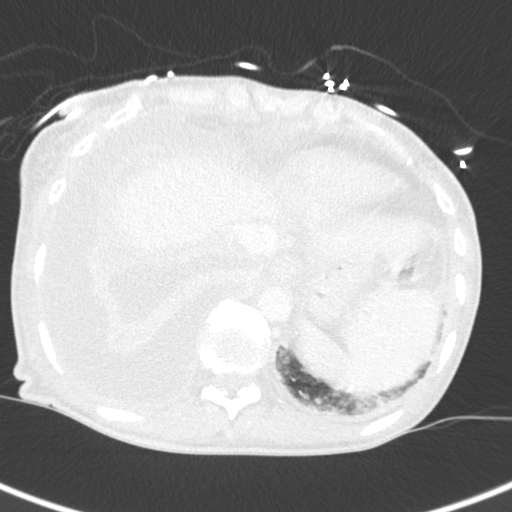
[im 40/145  lung]
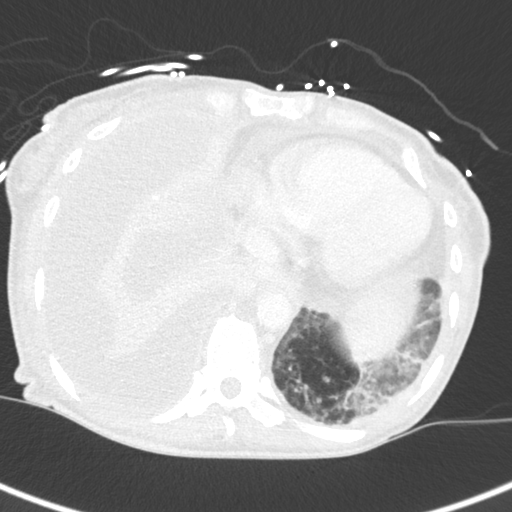
[im 53/145  mediastinal]
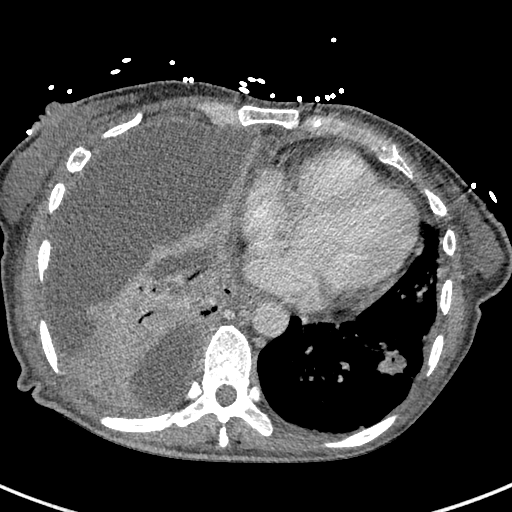
[im 53/145  lung]
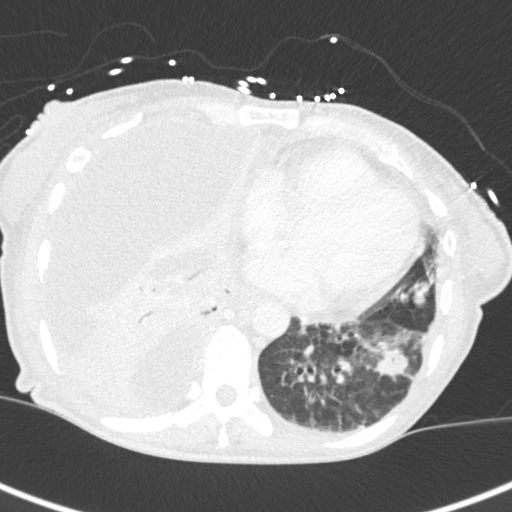
[im 59/145  lung]
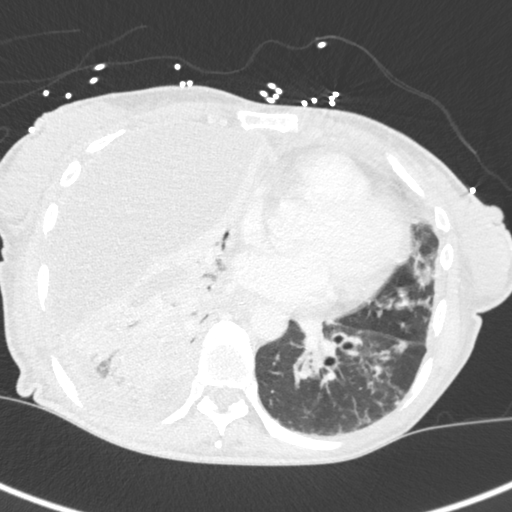
[im 73/145  lung]
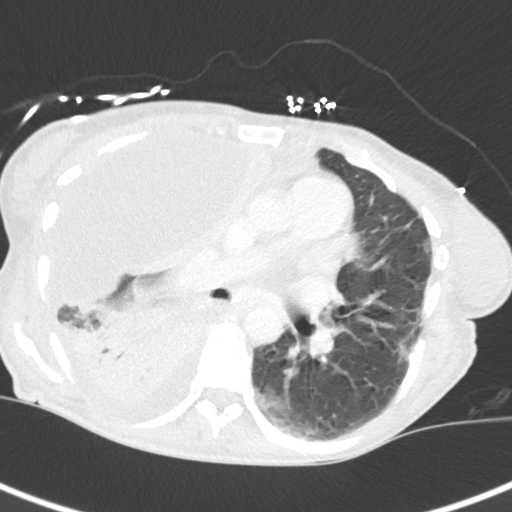
[im 86/145  lung]
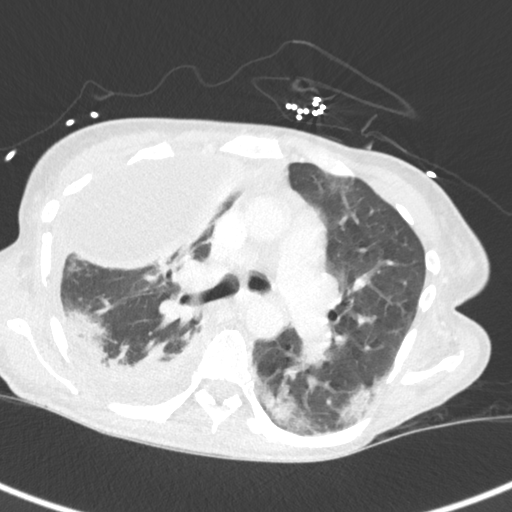
[im 92/145  mediastinal]
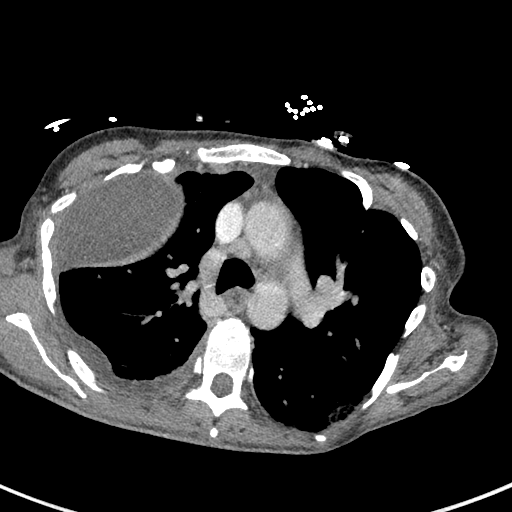
[im 92/145  lung]
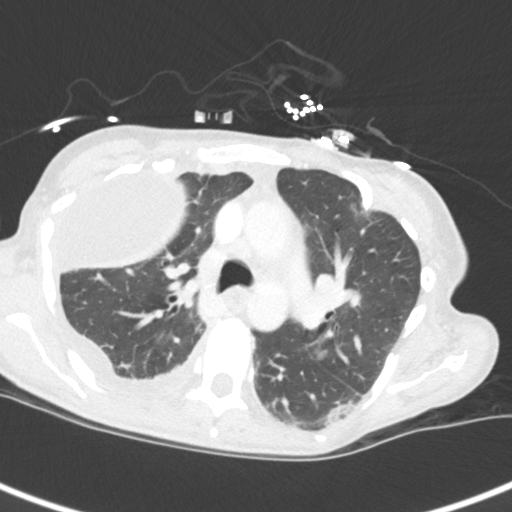
[im 105/145  lung]
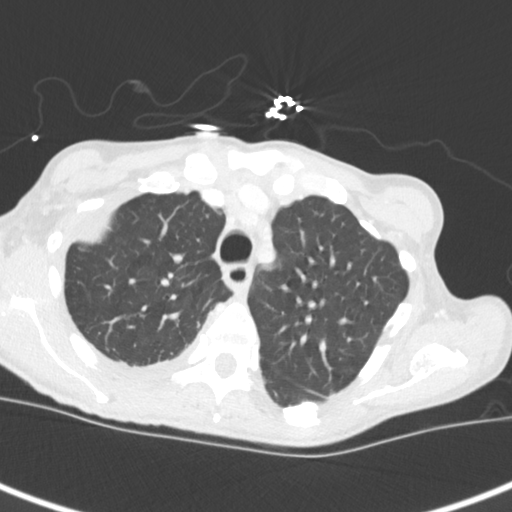
[im 112/145  lung]
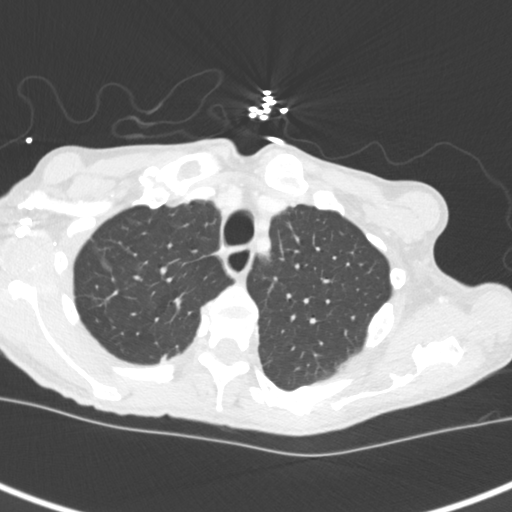
[im 125/145  lung]
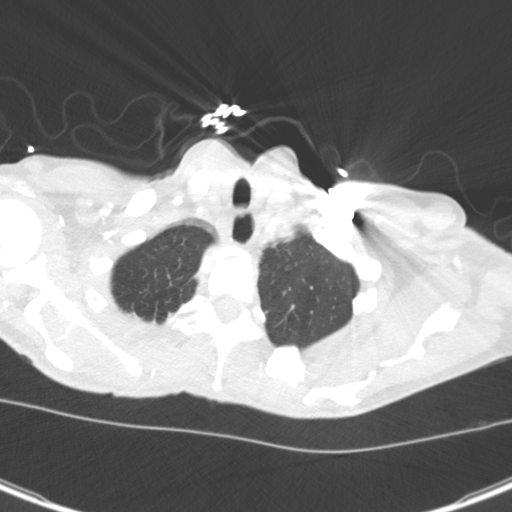
[im 138/145  mediastinal]
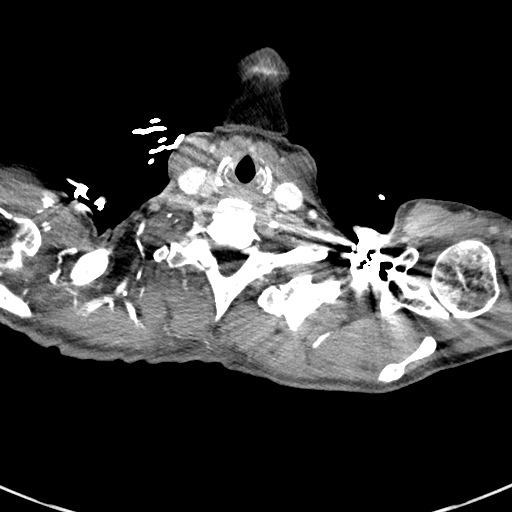
[im 138/145  lung]
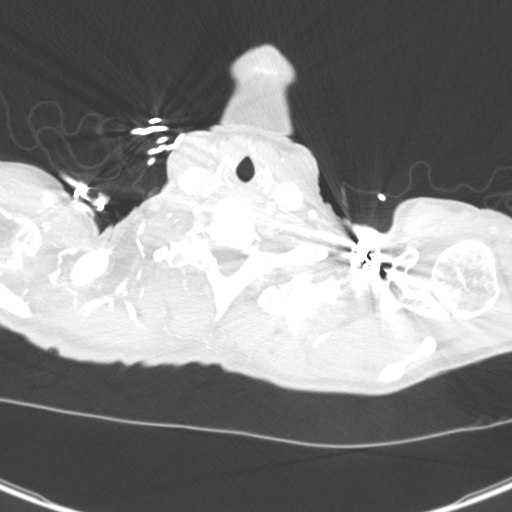

[Series 6: coronal · coronal · 0.56mm/px · 1 of 134 slices shown]
[im 67/134  lung]
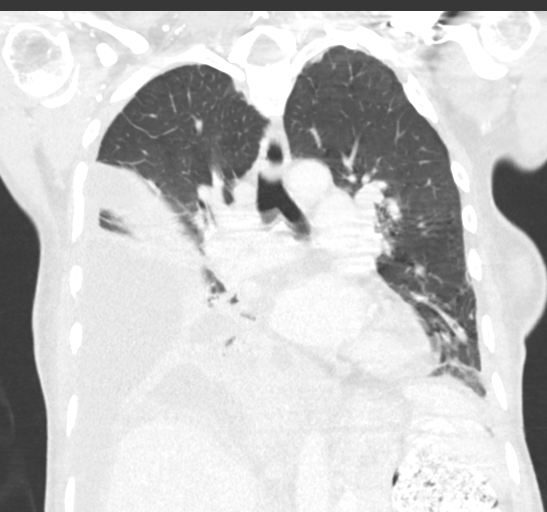

[14 of 36 positions shown; findings below may reference images not displayed]

FINDINGS: Cardiovascular: Extensive multi-vessel coronary artery
calcification. Global cardiac size within normal limits. Small
pericardial effusion is present, enlarged since prior examination.
The central pulmonary arteries are of normal caliber. Mild
atherosclerotic calcification within the thoracic aorta. No aortic
aneurysm.

Mediastinum/Nodes: Large right pleural effusion results in new, mild
mediastinal shift to the left. No pathologic thoracic adenopathy.
The esophagus is unremarkable. Visualized thyroid is unremarkable.

Lungs/Pleura: Since the prior examination, a large, partially
loculated right pleural effusion has developed with near complete
collapse of the right middle and lower lobes and compressive
atelectasis of the right upper lobe. There is superimposed patchy
multifocal pulmonary ground-glass infiltrate and peripheral
consolidation within the aerated lungs which appears progressive
since prior examination within the visualized lung bases. Previously
noted pulmonary nodules appear to have decreased in the interval,
with the index lesion within the left lung base previously measuring
22 x 25 mm now measuring 14 mm x 20 mm. This suggests an evolving
infectious or inflammatory process. Mild superimposed centrilobular
emphysema. No pneumothorax. No central obstructing mass.,

Upper Abdomen: Moderate stool within the visualized splenic flexure.
No acute abnormality.

Musculoskeletal: No acute bone abnormality. No lytic or blastic bone
lesions. Multiple healed bilateral rib fractures, bilateral clavicle
fractures, and left scapular fracture are noted.
IMPRESSION: Interval development of a large partially loculated right pleural
effusion with near complete collapse of the right lung base in
atelectasis of the right upper lobe and resultant mild mediastinal
shift to the left.

Evolving infectious or inflammatory process within the aerated lungs
with interval decrease in size of focal pulmonary nodules noted on
prior examination, but progressive peripheral consolidation and
ground-glass pulmonary infiltrate demonstrating a basilar
predominance. Differential considerations are led by acute infection
though pulmonary vasculitides or drug reaction could appear
similarly. Diagnostic and therapeutic thoracentesis of the large
right pleural effusion may be helpful for further management.

Extensive multi-vessel coronary artery calcification.

Mild emphysema

Aortic Atherosclerosis ([QT]-[QT]) and Emphysema ([QT]-[QT]).

## 2021-01-30 MED ORDER — ACETAMINOPHEN 500 MG PO TABS
1000.0000 mg | ORAL_TABLET | Freq: Once | ORAL | Status: AC
  Administered 2021-01-30: 1000 mg via ORAL
  Filled 2021-01-30: qty 2

## 2021-01-30 MED ORDER — MIRTAZAPINE 15 MG PO TABS
7.5000 mg | ORAL_TABLET | Freq: Every day | ORAL | Status: DC
  Administered 2021-01-30 – 2021-02-17 (×18): 7.5 mg via ORAL
  Filled 2021-01-30 (×20): qty 1

## 2021-01-30 MED ORDER — CHLORPROMAZINE HCL 25 MG PO TABS
50.0000 mg | ORAL_TABLET | ORAL | Status: DC | PRN
  Administered 2021-01-31 – 2021-02-01 (×2): 50 mg via ORAL
  Filled 2021-01-30: qty 2
  Filled 2021-01-30: qty 1
  Filled 2021-01-30 (×2): qty 2

## 2021-01-30 MED ORDER — DULOXETINE HCL 60 MG PO CPEP
60.0000 mg | ORAL_CAPSULE | Freq: Every day | ORAL | Status: DC
  Administered 2021-01-30 – 2021-02-03 (×5): 60 mg via ORAL
  Filled 2021-01-30 (×4): qty 1
  Filled 2021-01-30: qty 2

## 2021-01-30 MED ORDER — CLOZAPINE 25 MG PO TABS
250.0000 mg | ORAL_TABLET | Freq: Every day | ORAL | Status: DC
  Administered 2021-01-30 – 2021-02-17 (×18): 250 mg via ORAL
  Filled 2021-01-30 (×24): qty 2

## 2021-01-30 MED ORDER — BENZTROPINE MESYLATE 1 MG PO TABS
1.0000 mg | ORAL_TABLET | Freq: Two times a day (BID) | ORAL | Status: DC
  Administered 2021-01-30 – 2021-02-18 (×38): 1 mg via ORAL
  Filled 2021-01-30 (×42): qty 1

## 2021-01-30 MED ORDER — ACETAMINOPHEN 500 MG PO TABS
1000.0000 mg | ORAL_TABLET | Freq: Once | ORAL | Status: DC
Start: 2021-01-30 — End: 2021-02-11
  Filled 2021-01-30 (×2): qty 2

## 2021-01-30 MED ORDER — HEPARIN SODIUM (PORCINE) 5000 UNIT/ML IJ SOLN
5000.0000 [IU] | Freq: Three times a day (TID) | INTRAMUSCULAR | Status: DC
  Administered 2021-01-30 – 2021-02-18 (×55): 5000 [IU] via SUBCUTANEOUS
  Filled 2021-01-30 (×59): qty 1

## 2021-01-30 MED ORDER — ENTECAVIR 0.5 MG PO TABS: 0.5000 mg | ORAL_TABLET | Freq: Every day | ORAL | Status: DC

## 2021-01-30 MED ORDER — BENZONATATE 100 MG PO CAPS
100.0000 mg | ORAL_CAPSULE | Freq: Once | ORAL | Status: AC
  Administered 2021-01-30: 100 mg via ORAL
  Filled 2021-01-30: qty 1

## 2021-01-30 MED ORDER — SODIUM CHLORIDE 0.9 % IV SOLN
INTRAVENOUS | Status: DC
  Administered 2021-01-30: 75 mL/h via INTRAVENOUS

## 2021-01-30 MED ORDER — IOHEXOL 350 MG/ML SOLN
60.0000 mL | Freq: Once | INTRAVENOUS | Status: AC | PRN
  Administered 2021-01-30: 60 mL via INTRAVENOUS

## 2021-01-30 MED ORDER — HYDROXYZINE HCL 25 MG PO TABS
25.0000 mg | ORAL_TABLET | Freq: Three times a day (TID) | ORAL | Status: DC | PRN
  Administered 2021-02-04 – 2021-02-17 (×8): 25 mg via ORAL
  Filled 2021-01-30 (×8): qty 1

## 2021-01-30 MED ORDER — DOXYCYCLINE HYCLATE 100 MG PO TABS
100.0000 mg | ORAL_TABLET | Freq: Once | ORAL | Status: AC
  Administered 2021-01-30: 100 mg via ORAL
  Filled 2021-01-30: qty 1

## 2021-01-30 MED ORDER — AZITHROMYCIN 500 MG IV SOLR
500.0000 mg | INTRAVENOUS | Status: DC
  Administered 2021-01-30 – 2021-02-02 (×4): 500 mg via INTRAVENOUS
  Filled 2021-01-30 (×5): qty 500

## 2021-01-30 MED ORDER — HYDROXYZINE PAMOATE 25 MG PO CAPS
25.0000 mg | ORAL_CAPSULE | Freq: Three times a day (TID) | ORAL | Status: DC | PRN
  Filled 2021-01-30: qty 1

## 2021-01-30 MED ORDER — SODIUM CHLORIDE 0.9 % IV SOLN
2.0000 g | INTRAVENOUS | Status: DC
  Administered 2021-01-31 – 2021-02-01 (×2): 2 g via INTRAVENOUS
  Filled 2021-01-30 (×2): qty 20

## 2021-01-30 MED ORDER — SODIUM CHLORIDE 0.9 % IV SOLN
2.0000 g | Freq: Once | INTRAVENOUS | Status: AC
  Administered 2021-01-30: 2 g via INTRAVENOUS
  Filled 2021-01-30: qty 20

## 2021-01-30 MED ORDER — LACTATED RINGERS IV BOLUS
1000.0000 mL | Freq: Once | INTRAVENOUS | Status: AC
  Administered 2021-01-30: 1000 mL via INTRAVENOUS

## 2021-01-30 MED ORDER — ENSURE ENLIVE PO LIQD
237.0000 mL | Freq: Two times a day (BID) | ORAL | Status: DC
  Administered 2021-01-31 – 2021-02-05 (×7): 237 mL via ORAL

## 2021-01-30 MED ORDER — ALBUTEROL SULFATE HFA 108 (90 BASE) MCG/ACT IN AERS
2.0000 | INHALATION_SPRAY | RESPIRATORY_TRACT | Status: DC | PRN
  Filled 2021-01-30: qty 6.7

## 2021-01-30 MED ORDER — ACETAMINOPHEN 500 MG PO TABS
1000.0000 mg | ORAL_TABLET | Freq: Once | ORAL | Status: AC
  Administered 2021-01-30: 1000 mg via ORAL

## 2021-01-30 MED ORDER — POTASSIUM CHLORIDE CRYS ER 20 MEQ PO TBCR
40.0000 meq | EXTENDED_RELEASE_TABLET | Freq: Once | ORAL | Status: AC
  Administered 2021-01-30: 40 meq via ORAL
  Filled 2021-01-30: qty 2

## 2021-01-30 MED ORDER — POTASSIUM CHLORIDE 10 MEQ/100ML IV SOLN: 10.0000 meq | INTRAVENOUS | Status: DC

## 2021-01-30 NOTE — ED Provider Notes (Signed)
[facility] EMERGENCY DEPARTMENT Provider Note   CSN: [id number] Arrival date & time: 01/29/21  2253     History Chief Complaint  Patient presents with   Abdominal Pain    Margaret [name] is a 50 y.o. female.  The history is provided by a caregiver.  Abdominal Pain Pain location:  Generalized Pain quality: aching   Pain radiates to:  Does not radiate Pain severity:  Mild Onset quality:  Gradual Timing:  Constant Progression:  Worsening     Past Medical History:  Diagnosis Date   Hematuria 11/12/2014   Hepatitis B carrier (HCC)    Hypothyroidism    Impulse disorder    IUD (intrauterine device) in place 11/12/2014   Inserted 12/24/12 at [facility]   Mild intellectual disability    Schizophrenia ([place])    Schizophrenia ([place])    Tuberculosis    as a child   Urinary frequency 11/12/2014    Patient Active Problem List   Diagnosis Date Noted   Community acquired pneumonia 01/30/2021   Complex ovarian cyst 01/25/2021   Multiple pulmonary nodules 01/25/2021   RLQ abdominal mass 01/05/2021   Vitamin D deficiency 11/30/2020   Weight loss 03/19/2019   Schizophrenia ([place])    Hepatitis, viral    Hyperlipidemia 12/18/2014   History of tuberculosis 11/17/2014   Orthostatic hypotension 11/17/2014   Right nasal polyps 11/17/2014   IUD (intrauterine device) in place 11/12/2014   Chronic hepatitis B ([place]) 11/10/2014    Past Surgical History:  Procedure Laterality Date   ARM WOUND REPAIR / CLOSURE     TRACHEAL SURGERY     TRACHEOSTOMY       OB History     Gravida  0   Para  0   Term  0   Preterm  0   AB  0   Living  0      SAB  0   IAB  0   Ectopic  0   Multiple  0   Live Births              Family History  Adopted: Yes  Family history unknown: Yes    Social History   Tobacco Use   Smoking status: Never   Smokeless tobacco: Never  Vaping Use   Vaping Use: Never used  Substance Use Topics   Alcohol use: No    Alcohol/week: 0.0 standard  drinks   Drug use: No    Home Medications Prior to Admission medications   Medication Sig Start Date End Date Taking? Authorizing Provider  acetaminophen (TYLENOL) 650 MG CR tablet Take 650 mg by mouth every 8 (eight) hours as needed for pain.    [provider]  albuterol (VENTOLIN HFA) 108 (90 Base) MCG/ACT inhaler INHALE 2 PUFFS EVERY 4 HOURS AS NEEDED FOR WHEEZING. 07/30/20   Dettinger, Fransisca Kaufmann, MD  benztropine (COGENTIN) 1 MG tablet Take 1 tablet (1 mg total) by mouth 2 (two) times daily. 01/08/20   Dettinger, Fransisca Kaufmann, MD  chlorproMAZINE (THORAZINE) 50 MG tablet Take 50 mg by mouth as needed. One tablet by mouth PRN agitation, max of 2 tablets, 100 mg in 24 hours    [provider]  Cholecalciferol (VITAMIN D3) 50 MCG (2000 UT) capsule TAKE 1 CAPSULE BY MOUTH EVERY MORNING. 06/11/20   Dettinger, Fransisca Kaufmann, MD  cloZAPine (CLOZARIL) 100 MG tablet Take 250 mg by mouth at bedtime.    [provider]  DULoxetine (CYMBALTA) 60 MG capsule Take by mouth.  07/01/20   [provider]  entecavir (BARACLUDE) 0.5 MG tablet TAKE (1) TABLET BY MOUTH ONCE DAILY. 09/02/20   Harvel Quale, MD  Fish Oil-Cholecalciferol (FISH OIL + D3) 1200-1000 MG-UNIT CAPS TAKE 1 CAPSULE BY MOUTH TWICE DAILY. 03/05/20   Dettinger, Fransisca Kaufmann, MD  FLUoxetine (PROZAC) 20 MG capsule TAKE 3 CAPSULES BY MOUTH ONCE DAILY. 12/03/20   Dettinger, Fransisca Kaufmann, MD  fluticasone (FLONASE) 50 MCG/ACT nasal spray SPRAY 2 SPRAYS IN RIGHT NOSTRIL ONLY TWICE DAILY. 10/04/19   Dettinger, Fransisca Kaufmann, MD  hydrOXYzine (VISTARIL) 25 MG capsule Take 1 capsule (25 mg total) by mouth every 8 (eight) hours as needed for anxiety. 01/25/21   Dettinger, Fransisca Kaufmann, MD  ibuprofen (ADVIL) 600 MG tablet TAKE 1 TABLET BY MOUTH WITH FOOD 4 TIMES DAILY AS NEEDED FOR ACTIVE MODERATE PAIN UNRELIEVED BY ACETAMINOPHEN. MAX 2 DOSES/24 HR. 05/20/19   Dettinger, Fransisca Kaufmann, MD  levonorgestrel (MIRENA) 20 MCG/24HR IUD 1 each by Intrauterine  route once.    [provider]  mirtazapine (REMERON) 7.5 MG tablet Take 7.5 mg by mouth at bedtime. 10/28/20   [provider]  Multiple Vitamins-Minerals (CENTROVITE) TABS TAKE 1 TABLET BY MOUTH AT BEDTIME. 05/04/20   Dettinger, Fransisca Kaufmann, MD  polyethylene glycol powder (GLYCOLAX/MIRALAX) 17 GM/SCOOP powder Take 17 g by mouth daily. 01/21/21   Rogene Houston, MD  Skin Protectants, Misc. (MINERIN CREME) CREA APPLY MODERATE AMOUNT TOPICALLY TO DRY SKIN ON SOLES & HEELS OF BOTH FEET AFTER SHOWER. (NURSING STAFF TO SUPERVISE APPLICATION) 09/32/35   Dettinger, Fransisca Kaufmann, MD  vitamin E 200 UNIT capsule Take 1 capsule (200 Units total) by mouth daily. 05/13/20   Dettinger, Fransisca Kaufmann, MD    Allergies    Patient has no known allergies.  Review of Systems   Review of Systems  Unable to perform ROS: Patient nonverbal  Gastrointestinal:  Positive for abdominal pain.   Physical Exam Updated Vital Signs BP (!) 94/57   Pulse 98   Temp 99.1 F (37.3 C) (Oral)   Resp (!) 29   Ht 5' 4"  (1.626 m)   Wt 48.5 kg   SpO2 96%   BMI 18.37 kg/m   Physical Exam Vitals and nursing note reviewed.  Constitutional:      Appearance: She is well-developed.  HENT:     Head: Normocephalic and atraumatic.  Cardiovascular:     Rate and Rhythm: Regular rhythm. Tachycardia present.  Pulmonary:     Effort: Tachypnea present. No respiratory distress.     Breath sounds: Decreased air movement present. No stridor.  Abdominal:     General: There is no distension.     Tenderness: There is no abdominal tenderness.  Musculoskeletal:     Cervical back: Normal range of motion.  Neurological:     Mental Status: She is alert.    ED Results / Procedures / Treatments   Labs (all labs ordered are listed, but only abnormal results are displayed) Labs Reviewed  COMPREHENSIVE METABOLIC PANEL - Abnormal; Notable for the following components:      Result Value   Sodium 133 (*)    Potassium 3.2 (*)     Glucose, Bld 137 (*)    Calcium 8.3 (*)    Total Protein 5.6 (*)    Albumin 2.1 (*)    All other components within normal limits  CBC - Abnormal; Notable for the following components:   WBC 31.9 (*)    RBC 3.37 (*)  Hemoglobin 9.3 (*)    HCT 29.5 (*)    All other components within normal limits  URINALYSIS, ROUTINE W REFLEX MICROSCOPIC - Abnormal; Notable for the following components:   Specific Gravity, Urine 1.043 (*)    Nitrite POSITIVE (*)    All other components within normal limits  RESP PANEL BY RT-PCR (FLU A&B, COVID) ARPGX2  LIPASE, BLOOD  LACTIC ACID, PLASMA  HCG, QUANTITATIVE, PREGNANCY  LACTIC ACID, PLASMA    EKG EKG Interpretation  Date/Time:  Friday January 29 2021 23:07:30 EDT Ventricular Rate:  113 PR Interval:  131 QRS Duration: 87 QT Interval:  330 QTC Calculation: 453 R Axis:   112 Text Interpretation: Sinus tachycardia Right axis deviation Low voltage, precordial leads Abnormal R-wave progression, early transition Confirmed by Merrily Pew [phone]) on 01/30/2021 3:13:40 AM  Radiology CT Chest W Contrast  Result Date: 01/30/2021 CLINICAL DATA:  Multiple pulmonary nodules suspicious for pulmonary metastatic disease. EXAM: CT CHEST WITH CONTRAST TECHNIQUE: Multidetector CT imaging of the chest was performed during intravenous contrast administration. CONTRAST:  53m OMNIPAQUE IOHEXOL 350 MG/ML SOLN COMPARISON:  CT abdomen pelvis 01/20/2021 FINDINGS: Cardiovascular: Extensive multi-vessel coronary artery calcification. Global cardiac size within normal limits. Small pericardial effusion is present, enlarged since prior examination. The central pulmonary arteries are of normal caliber. Mild atherosclerotic calcification within the thoracic aorta. No aortic aneurysm. Mediastinum/Nodes: Large right pleural effusion results in new, mild mediastinal shift to the left. No pathologic thoracic adenopathy. The esophagus is unremarkable. Visualized thyroid is  unremarkable. Lungs/Pleura: Since the prior examination, a large, partially loculated right pleural effusion has developed with near complete collapse of the right middle and lower lobes and compressive atelectasis of the right upper lobe. There is superimposed patchy multifocal pulmonary ground-glass infiltrate and peripheral consolidation within the aerated lungs which appears progressive since prior examination within the visualized lung bases. Previously noted pulmonary nodules appear to have decreased in the interval, with the index lesion within the left lung base previously measuring 22 x 25 mm now measuring 14 mm x 20 mm. This suggests an evolving infectious or inflammatory process. Mild superimposed centrilobular emphysema. No pneumothorax. No central obstructing mass., Upper Abdomen: Moderate stool within the visualized splenic flexure. No acute abnormality. Musculoskeletal: No acute bone abnormality. No lytic or blastic bone lesions. Multiple healed bilateral rib fractures, bilateral clavicle fractures, and left scapular fracture are noted. IMPRESSION: Interval development of a large partially loculated right pleural effusion with near complete collapse of the right lung base in atelectasis of the right upper lobe and resultant mild mediastinal shift to the left. Evolving infectious or inflammatory process within the aerated lungs with interval decrease in size of focal pulmonary nodules noted on prior examination, but progressive peripheral consolidation and ground-glass pulmonary infiltrate demonstrating a basilar predominance. Differential considerations are led by acute infection though pulmonary vasculitides or drug reaction could appear similarly. Diagnostic and therapeutic thoracentesis of the large right pleural effusion may be helpful for further management. Extensive multi-vessel coronary artery calcification. Mild emphysema Aortic Atherosclerosis (ICD10-I70.0) and Emphysema (ICD10-J43.9).  Electronically Signed   By: AFidela SalisburyM.D.   On: 01/30/2021 04:16    Procedures Procedures   Medications Ordered in ED Medications  acetaminophen (TYLENOL) tablet 1,000 mg (1,000 mg Oral Not Given 01/30/21 0316)  azithromycin (ZITHROMAX) 500 mg in sodium chloride 0.9 % 250 mL IVPB (500 mg Intravenous New Bag/Given 01/30/21 0530)  potassium chloride 10 mEq in 100 mL IVPB (has no administration in time range)  benzonatate (TESSALON) capsule  100 mg (100 mg Oral Given 01/30/21 0300)  acetaminophen (TYLENOL) tablet 1,000 mg (1,000 mg Oral Given 01/30/21 0301)  doxycycline (VIBRA-TABS) tablet 100 mg (100 mg Oral Given 01/30/21 0328)  iohexol (OMNIPAQUE) 350 MG/ML injection 60 mL (60 mLs Intravenous Contrast Given 01/30/21 0344)  cefTRIAXone (ROCEPHIN) 2 g in sodium chloride 0.9 % 100 mL IVPB (2 g Intravenous New Bag/Given 01/30/21 0522)  lactated ringers bolus 1,000 mL (1,000 mLs Intravenous New Bag/Given 01/30/21 0530)    ED Course  I have reviewed the triage vital signs and the nursing notes.  Pertinent labs & imaging results that were available during my care of the patient were reviewed by me and considered in my medical decision making (see chart for details).    MDM Rules/Calculators/A&P                         H/o cancer. Here with cough. Fever/tachy on arrival. CT chest done and shows likely ifnectious consolidation with large effusion with mediastinal shift. Abx iniatied. Cultures done. Will admit for same.    Final Clinical Impression(s) / ED Diagnoses Final diagnoses:  Community acquired pneumonia of right middle lobe of lung  Community acquired pneumonia, unspecified laterality    Rx / DC Orders ED Discharge Orders     None        Tremain Rucinski, Corene Cornea, MD 01/30/21 [phone]

## 2021-01-30 NOTE — H&P (Signed)
TRH H&P    Patient Demographics:    Margaret [name], is a 50 y.o. female  MRN: [id number]  DOB - 1970-08-20  Admit Date - 01/29/2021  Referring MD/NP/PA: [place]  Outpatient Primary MD for the patient is Dettinger, Fransisca Kaufmann, MD  Patient coming from: [facility]  Chief complaint- Difficulty with ambulation   HPI:    Margaret [name]  is a 50 y.o. female, with history of ovarian cancer, hepatitis B carrier, questionable hypothyroidism-not on any medication for it, impulse disorder, mild intellectual disability, TB, and more presents ED with a chief complaint of difficulty w/ambulation.  Patient does not give any history at all.  She repeats that she is hungry, and caregiver tries to encourage her to give history but she refuses at this time.  Caregiver at bedside reports that patient was recently diagnosed with ovarian cancer with mets to the lungs.  They are not disclosing this information to the patient.  She reports that the patient got up out of bed at 9 PM and was having difficulty with ambulation, and because of the cancer diagnosis the caregiver was concerned and decided to call EMS.  Patient walks independently at baseline without cane or walker.  Neither EMS nor the caregiver checked the temperature before coming into the hospital per the caregiver report.  Patient has had a worsening cough over the last 2 days.  She has a normal cough, but its been more frequent, more violent, and more productive.  She has had decreased appetite since yesterday.  Patient does complain of pains in her abdomen, likely related to the ovarian cancer.  She is also complained of headaches.  She has not had any diarrhea, nausea, or vomiting.  No further history could be obtained at this time -as patient is not participating.  Patient does not smoke, does not drink.  She is vaccinated for COVID.  Patient is full code.  In the  ED Temperature up to 100.9, heart rate 106, respiratory rate 17-38, blood pressure 116/72, satting 95% Leukocytosis 31.9, hemoglobin 9.3 Chemistry panel reveals a hypokalemia 3.2 CT chest shows interval development of large partially loculated right pleural effusion with near collapse of right lung base.  Atelectasis of the right upper lobe with mediastinal shift to the left.  Evolving infectious or inflammatory process within the aerated lungs with interval decrease in the pulmonary nodules.  Progressive peripheral consolidation and groundglass pulmonary infiltrate demonstrating a basilar predominance.  Differential includes acute infection, pulmonary vasculitides, or drug reaction.  Respiratory panel pending.  Patient was given 1 g of Tylenol x2, Tessalon Perles, initially started on doxycycline tabs, but then started on Rocephin and Zithromax.  Admission requested for further management of pleural effusion and community-acquired pneumonia.    Review of systems:    In addition to the HPI above,   Had reported headache-no details available, No changes with Vision or hearing, No problems swallowing food or Liquids, No Chest pain, has had productive cough Has complained of abdominal pain, No Nausea or Vomiting, bowel movements are regular, No Blood in  stool or Urine, No dysuria, No new skin rashes or bruises, No new joints pains-aches,  No recent weight gain or loss, No polyuria, polydypsia or polyphagia, No significant Mental Stressors.  All other systems reviewed and are negative.    Past History of the following :    Past Medical History:  Diagnosis Date   Hematuria 11/12/2014   Hepatitis B carrier ([place])    Hypothyroidism    Impulse disorder    IUD (intrauterine device) in place 11/12/2014   Inserted 12/24/12 at [facility]   Mild intellectual disability    Schizophrenia ([place])    Schizophrenia ([place])    Tuberculosis    as a child   Urinary frequency 11/12/2014      Past Surgical  History:  Procedure Laterality Date   ARM WOUND REPAIR / CLOSURE     TRACHEAL SURGERY     TRACHEOSTOMY        Social History:      Social History   Tobacco Use   Smoking status: Never   Smokeless tobacco: Never  Substance Use Topics   Alcohol use: No    Alcohol/week: 0.0 standard drinks       Family History :     Family History  Adopted: Yes  Family history unknown: Yes      Home Medications:   Prior to Admission medications   Medication Sig Start Date End Date Taking? Authorizing Provider  acetaminophen (TYLENOL) 650 MG CR tablet Take 650 mg by mouth every 8 (eight) hours as needed for pain.    [provider]  albuterol (VENTOLIN HFA) 108 (90 Base) MCG/ACT inhaler INHALE 2 PUFFS EVERY 4 HOURS AS NEEDED FOR WHEEZING. 07/30/20   Dettinger, Fransisca Kaufmann, MD  benztropine (COGENTIN) 1 MG tablet Take 1 tablet (1 mg total) by mouth 2 (two) times daily. 01/08/20   Dettinger, Fransisca Kaufmann, MD  chlorproMAZINE (THORAZINE) 50 MG tablet Take 50 mg by mouth as needed. One tablet by mouth PRN agitation, max of 2 tablets, 100 mg in 24 hours    [provider]  Cholecalciferol (VITAMIN D3) 50 MCG (2000 UT) capsule TAKE 1 CAPSULE BY MOUTH EVERY MORNING. 06/11/20   Dettinger, Fransisca Kaufmann, MD  cloZAPine (CLOZARIL) 100 MG tablet Take 250 mg by mouth at bedtime.    [provider]  DULoxetine (CYMBALTA) 60 MG capsule Take by mouth. 07/01/20   [provider]  entecavir (BARACLUDE) 0.5 MG tablet TAKE (1) TABLET BY MOUTH ONCE DAILY. 09/02/20   Harvel Quale, MD  Fish Oil-Cholecalciferol (FISH OIL + D3) 1200-1000 MG-UNIT CAPS TAKE 1 CAPSULE BY MOUTH TWICE DAILY. 03/05/20   Dettinger, Fransisca Kaufmann, MD  FLUoxetine (PROZAC) 20 MG capsule TAKE 3 CAPSULES BY MOUTH ONCE DAILY. 12/03/20   Dettinger, Fransisca Kaufmann, MD  fluticasone (FLONASE) 50 MCG/ACT nasal spray SPRAY 2 SPRAYS IN RIGHT NOSTRIL ONLY TWICE DAILY. 10/04/19   Dettinger, Fransisca Kaufmann, MD  hydrOXYzine (VISTARIL) 25 MG capsule  Take 1 capsule (25 mg total) by mouth every 8 (eight) hours as needed for anxiety. 01/25/21   Dettinger, Fransisca Kaufmann, MD  ibuprofen (ADVIL) 600 MG tablet TAKE 1 TABLET BY MOUTH WITH FOOD 4 TIMES DAILY AS NEEDED FOR ACTIVE MODERATE PAIN UNRELIEVED BY ACETAMINOPHEN. MAX 2 DOSES/24 HR. 05/20/19   Dettinger, Fransisca Kaufmann, MD  levonorgestrel (MIRENA) 20 MCG/24HR IUD 1 each by Intrauterine route once.    [provider]  mirtazapine (REMERON) 7.5 MG tablet Take 7.5 mg by mouth at bedtime. 10/28/20  [provider]  Multiple Vitamins-Minerals (CENTROVITE) TABS TAKE 1 TABLET BY MOUTH AT BEDTIME. 05/04/20   Dettinger, Fransisca Kaufmann, MD  polyethylene glycol powder (GLYCOLAX/MIRALAX) 17 GM/SCOOP powder Take 17 g by mouth daily. 01/21/21   Rogene Houston, MD  Skin Protectants, Misc. (MINERIN CREME) CREA APPLY MODERATE AMOUNT TOPICALLY TO DRY SKIN ON SOLES & HEELS OF BOTH FEET AFTER SHOWER. (NURSING STAFF TO SUPERVISE APPLICATION) 14/97/02   Dettinger, Fransisca Kaufmann, MD  vitamin E 200 UNIT capsule Take 1 capsule (200 Units total) by mouth daily. 05/13/20   Dettinger, Fransisca Kaufmann, MD     Allergies:    No Known Allergies   Physical Exam:   Vitals  Blood pressure (!) 94/57, pulse 98, temperature 99.1 F (37.3 C), temperature source Oral, resp. rate (!) 29, height 5' 4"  (1.626 m), weight 48.5 kg, SpO2 96 %.  1.  General: Patient lying supine in bed,  no acute distress   2. Psychiatric: Alert, with flat affect, and reluctance to participate in medical interview   3. Neurologic: Speech and language are at baseline, face is symmetric, moves all 4 extremities voluntarily, at baseline without acute deficits on limited exam   4. HEENMT:  Head is atraumatic, normocephalic, pupils reactive to light, neck is supple, trachea is midline, mucous membranes are moist   5. Respiratory : Right lung field is diminished throughout, maintaining oxygen saturations on room air, and no increased work of breathing or  accessory muscle use, no wheezing or rhonchi  6. Cardiovascular : Heart rate tachycardic, rhythm is regular, no murmurs, rubs or gallops, no peripheral edema, peripheral pulses palpated   7. Gastrointestinal:  Abdomen is soft, nondistended, nontender to palpation bowel sounds active, no masses or organomegaly palpated   8. Skin:  Skin is warm, dry and intact without rashes, acute lesions, or ulcers on limited exam   9.Musculoskeletal:  No acute deformities or trauma, no asymmetry in tone, no peripheral edema, peripheral pulses palpated, no tenderness to palpation in the extremities     Data Review:    CBC Recent Labs  Lab 01/30/21 0016  WBC 31.9*  HGB 9.3*  HCT 29.5*  PLT 356  MCV 87.5  MCH 27.6  MCHC 31.5  RDW 13.0   ------------------------------------------------------------------------------------------------------------------  Results for orders placed or performed during the hospital encounter of 01/29/21 (from the past 48 hour(s))  Lipase, blood     Status: None   Collection Time: 01/30/21 12:16 AM  Result Value Ref Range   Lipase 26 11 - 51 U/L    Comment: Performed at [facility], [address], [place], [place] [address]  Comprehensive metabolic panel     Status: Abnormal   Collection Time: 01/30/21 12:16 AM  Result Value Ref Range   Sodium 133 (L) 135 - 145 mmol/L   Potassium 3.2 (L) 3.5 - 5.1 mmol/L   Chloride 98 98 - 111 mmol/L   CO2 28 22 - 32 mmol/L   Glucose, Bld 137 (H) 70 - 99 mg/dL    Comment: Glucose reference range applies only to samples taken after fasting for at least 8 hours.   BUN 9 6 - 20 mg/dL   Creatinine, Ser 0.61 0.44 - 1.00 mg/dL   Calcium 8.3 (L) 8.9 - 10.3 mg/dL   Total Protein 5.6 (L) 6.5 - 8.1 g/dL   Albumin 2.1 (L) 3.5 - 5.0 g/dL   AST 19 15 - 41 U/L   ALT 22 0 - 44 U/L   Alkaline Phosphatase 87  38 - 126 U/L   Total Bilirubin 0.3 0.3 - 1.2 mg/dL   GFR, Estimated >60 >60 mL/min    Comment: (NOTE) Calculated using the  CKD-EPI Creatinine Equation (2021)    Anion gap 7 5 - 15    Comment: Performed at [facility], [address], [place], [place] [address]  CBC     Status: Abnormal   Collection Time: 01/30/21 12:16 AM  Result Value Ref Range   WBC 31.9 (H) 4.0 - 10.5 K/uL   RBC 3.37 (L) 3.87 - 5.11 MIL/uL   Hemoglobin 9.3 (L) 12.0 - 15.0 g/dL   HCT 29.5 (L) 36.0 - 46.0 %   MCV 87.5 80.0 - 100.0 fL   MCH 27.6 26.0 - 34.0 pg   MCHC 31.5 30.0 - 36.0 g/dL   RDW 13.0 11.5 - 15.5 %   Platelets 356 150 - 400 K/uL   nRBC 0.0 0.0 - 0.2 %    Comment: Performed at [facility], [address] [place], [place] [address]  Resp Panel by RT-PCR (Flu A&B, Covid) Nasopharyngeal Swab     Status: None   Collection Time: 01/30/21  3:14 AM   Specimen: Nasopharyngeal Swab; Nasopharyngeal(NP) swabs in vial transport medium  Result Value Ref Range   SARS Coronavirus 2 by RT PCR NEGATIVE NEGATIVE    Comment: (NOTE) SARS-CoV-2 target nucleic acids are NOT DETECTED.  The SARS-CoV-2 RNA is generally detectable in upper respiratory specimens during the acute phase of infection. The lowest concentration of SARS-CoV-2 viral copies this assay can detect is 138 copies/mL. A negative result does not preclude SARS-Cov-2 infection and should not be used as the sole basis for treatment or other patient management decisions. A negative result may occur with  improper specimen collection/handling, submission of specimen other than nasopharyngeal swab, presence of viral mutation(s) within the areas targeted by this assay, and inadequate number of viral copies(<138 copies/mL). A negative result must be combined with clinical observations, patient history, and epidemiological information. The expected result is Negative.  Fact Sheet for Patients:  EntrepreneurPulse.com.au  Fact Sheet for Healthcare Providers:  IncredibleEmployment.be  This test is no t yet approved or cleared by the [place] FDA and  has been authorized for detection and/or diagnosis of SARS-CoV-2 by FDA under an Emergency Use Authorization (EUA). This EUA will remain  in effect (meaning this test can be used) for the duration of the COVID-19 declaration under Section 564(b)(1) of the Act, 21 U.S.C.section 360bbb-3(b)(1), unless the authorization is terminated  or revoked sooner.       Influenza A by PCR NEGATIVE NEGATIVE   Influenza B by PCR NEGATIVE NEGATIVE    Comment: (NOTE) The Xpert Xpress SARS-CoV-2/FLU/RSV plus assay is intended as an aid in the diagnosis of influenza from Nasopharyngeal swab specimens and should not be used as a sole basis for treatment. Nasal washings and aspirates are unacceptable for Xpert Xpress SARS-CoV-2/FLU/RSV testing.  Fact Sheet for Patients: EntrepreneurPulse.com.au  Fact Sheet for Healthcare Providers: IncredibleEmployment.be  This test is not yet approved or cleared by the [place] FDA and has been authorized for detection and/or diagnosis of SARS-CoV-2 by FDA under an Emergency Use Authorization (EUA). This EUA will remain in effect (meaning this test can be used) for the duration of the COVID-19 declaration under Section 564(b)(1) of the Act, 21 U.S.C. section 360bbb-3(b)(1), unless the authorization is terminated or revoked.  Performed at [facility], [address], [place], [place] [address]   Lactic acid, plasma  Status: None   Collection Time: 01/30/21  3:25 AM  Result Value Ref Range   Lactic Acid, Venous 0.7 0.5 - 1.9 mmol/L    Comment: Performed at [facility], [address], [place], [place] [address]  hCG, quantitative, pregnancy     Status: None   Collection Time: 01/30/21  3:25 AM  Result Value Ref Range   hCG, Beta Chain, Quant, S <1 <5 mIU/mL    Comment:          GEST. AGE      CONC.  (mIU/mL)   <=1 WEEK        5 - 50     2 WEEKS       50 - 500     3 WEEKS       100 - 10,000     4  WEEKS     1,000 - 30,000     5 WEEKS     3,500 - 115,000   6-8 WEEKS     12,000 - 270,000    12 WEEKS     15,000 - 220,000        FEMALE AND NON-PREGNANT FEMALE:     LESS THAN 5 mIU/mL Performed at [facility], [address] [place], [place] [address]   Urinalysis, Routine w reflex microscopic Urine, Clean Catch     Status: Abnormal   Collection Time: 01/30/21  5:04 AM  Result Value Ref Range   Color, Urine YELLOW YELLOW   APPearance CLEAR CLEAR   Specific Gravity, Urine 1.043 (H) 1.005 - 1.030   pH 6.0 5.0 - 8.0   Glucose, UA NEGATIVE NEGATIVE mg/dL   Hgb urine dipstick NEGATIVE NEGATIVE   Bilirubin Urine NEGATIVE NEGATIVE   Ketones, ur NEGATIVE NEGATIVE mg/dL   Protein, ur NEGATIVE NEGATIVE mg/dL   Nitrite POSITIVE (A) NEGATIVE   Leukocytes,Ua NEGATIVE NEGATIVE   RBC / HPF 0-5 0 - 5 RBC/hpf   WBC, UA 0-5 0 - 5 WBC/hpf   Bacteria, UA NONE SEEN NONE SEEN   Squamous Epithelial / LPF 0-5 0 - 5   Mucus PRESENT     Comment: Performed at [facility], [address] [place], [place] [address]  Lactic acid, plasma     Status: None   Collection Time: 01/30/21  5:57 AM  Result Value Ref Range   Lactic Acid, Venous 0.9 0.5 - 1.9 mmol/L    Comment: Performed at [facility], [address] [place] [address], [place], [place] [address]    Chemistries  Recent Labs  Lab 01/30/21 0016  NA 133*  K 3.2*  CL 98  CO2 28  GLUCOSE 137*  BUN 9  CREATININE 0.61  CALCIUM 8.3*  AST 19  ALT 22  ALKPHOS 87  BILITOT 0.3   ------------------------------------------------------------------------------------------------------------------  ------------------------------------------------------------------------------------------------------------------ GFR: Estimated Creatinine Clearance: 65.8 mL/min (by C-G formula based on SCr of 0.61 mg/dL). Liver Function Tests: Recent Labs  Lab 01/30/21 0016  AST 19  ALT 22  ALKPHOS 87  BILITOT 0.3  PROT 5.6*  ALBUMIN 2.1*   Recent Labs  Lab 01/30/21 0016   LIPASE 26   No results for input(s): AMMONIA in the last 168 hours. Coagulation Profile: No results for input(s): INR, PROTIME in the last 168 hours. Cardiac Enzymes: No results for input(s): CKTOTAL, CKMB, CKMBINDEX, TROPONINI in the last 168 hours. BNP (last 3 results) No results for input(s): PROBNP in the last 8760 hours. HbA1C: No results for input(s): HGBA1C in the last 72 hours. CBG: No  results for input(s): GLUCAP in the last 168 hours. Lipid Profile: No results for input(s): CHOL, HDL, LDLCALC, TRIG, CHOLHDL, LDLDIRECT in the last 72 hours. Thyroid Function Tests: No results for input(s): TSH, T4TOTAL, FREET4, T3FREE, THYROIDAB in the last 72 hours. Anemia Panel: No results for input(s): VITAMINB12, FOLATE, FERRITIN, TIBC, IRON, RETICCTPCT in the last 72 hours.  --------------------------------------------------------------------------------------------------------------- Urine analysis:    Component Value Date/Time   COLORURINE YELLOW 01/30/2021 0504   APPEARANCEUR CLEAR 01/30/2021 0504   APPEARANCEUR Clear 06/30/2016 1013   LABSPEC 1.043 (H) 01/30/2021 0504   PHURINE 6.0 01/30/2021 0504   GLUCOSEU NEGATIVE 01/30/2021 0504   HGBUR NEGATIVE 01/30/2021 0504   BILIRUBINUR NEGATIVE 01/30/2021 0504   BILIRUBINUR Negative 06/30/2016 [place] 01/30/2021 0504   PROTEINUR NEGATIVE 01/30/2021 0504   UROBILINOGEN 0.2 04/13/2011 0330   NITRITE POSITIVE (A) 01/30/2021 0504   LEUKOCYTESUR NEGATIVE 01/30/2021 0504      Imaging Results:    CT Chest W Contrast  Result Date: 01/30/2021 CLINICAL DATA:  Multiple pulmonary nodules suspicious for pulmonary metastatic disease. EXAM: CT CHEST WITH CONTRAST TECHNIQUE: Multidetector CT imaging of the chest was performed during intravenous contrast administration. CONTRAST:  2m OMNIPAQUE IOHEXOL 350 MG/ML SOLN COMPARISON:  CT abdomen pelvis 01/20/2021 FINDINGS: Cardiovascular: Extensive multi-vessel coronary artery  calcification. Global cardiac size within normal limits. Small pericardial effusion is present, enlarged since prior examination. The central pulmonary arteries are of normal caliber. Mild atherosclerotic calcification within the thoracic aorta. No aortic aneurysm. Mediastinum/Nodes: Large right pleural effusion results in new, mild mediastinal shift to the left. No pathologic thoracic adenopathy. The esophagus is unremarkable. Visualized thyroid is unremarkable. Lungs/Pleura: Since the prior examination, a large, partially loculated right pleural effusion has developed with near complete collapse of the right middle and lower lobes and compressive atelectasis of the right upper lobe. There is superimposed patchy multifocal pulmonary ground-glass infiltrate and peripheral consolidation within the aerated lungs which appears progressive since prior examination within the visualized lung bases. Previously noted pulmonary nodules appear to have decreased in the interval, with the index lesion within the left lung base previously measuring 22 x 25 mm now measuring 14 mm x 20 mm. This suggests an evolving infectious or inflammatory process. Mild superimposed centrilobular emphysema. No pneumothorax. No central obstructing mass., Upper Abdomen: Moderate stool within the visualized splenic flexure. No acute abnormality. Musculoskeletal: No acute bone abnormality. No lytic or blastic bone lesions. Multiple healed bilateral rib fractures, bilateral clavicle fractures, and left scapular fracture are noted. IMPRESSION: Interval development of a large partially loculated right pleural effusion with near complete collapse of the right lung base in atelectasis of the right upper lobe and resultant mild mediastinal shift to the left. Evolving infectious or inflammatory process within the aerated lungs with interval decrease in size of focal pulmonary nodules noted on prior examination, but progressive peripheral consolidation and  ground-glass pulmonary infiltrate demonstrating a basilar predominance. Differential considerations are led by acute infection though pulmonary vasculitides or drug reaction could appear similarly. Diagnostic and therapeutic thoracentesis of the large right pleural effusion may be helpful for further management. Extensive multi-vessel coronary artery calcification. Mild emphysema Aortic Atherosclerosis (ICD10-I70.0) and Emphysema (ICD10-J43.9). Electronically Signed   By: AFidela SalisburyM.D.   On: 01/30/2021 04:16       Assessment & Plan:    Active Problems:   Hepatitis B   Community acquired pneumonia   Pleural effusion   Hypokalemia   Psychiatric disorder   Community-acquired pneumonia Continue Rocephin  and Zithromax Pneumonia order set utilized Urine antigens pending Blood culture sputum culture pending Continue to monitor Large pleural effusion Likely related to neoplastic process Diagnostic and therapeutic thoracentesis Continue to monitor Hypokalemia Replace and recheck in the a.m. Psychiatric disorder Continue Thorazine, Cogentin, Cymbalta, and Clozaril Hepatitis B carrier Continue Entecavir    DVT Prophylaxis-   Heparin- SCDs   AM Labs Ordered, also please review Full Orders  Family Communication: Admission, patients condition and plan of care including tests being ordered have been discussed with the patient and caregiver who indicate understanding and agree with the plan and Code Status.  Code Status: Full  Admission status: Inpatient :The appropriate admission status for this patient is INPATIENT. Inpatient status is judged to be reasonable and necessary in order to provide the required intensity of service to ensure the patient's safety. The patient's presenting symptoms, physical exam findings, and initial radiographic and laboratory data in the context of their chronic comorbidities is felt to place them at high risk for further clinical deterioration.  Furthermore, it is not anticipated that the patient will be medically stable for discharge from the hospital within 2 midnights of admission. The following factors support the admission status of inpatient.     The patient's presenting symptoms include difficulty with ambulation. The worrisome physical exam findings include tachycardic. The initial radiographic and laboratory data are worrisome because of pneumonia and pleural effusion. The chronic co-morbidities include psychiatric disorder, hepatitis B carrier.       * I certify that at the point of admission it is my clinical judgment that the patient will require inpatient hospital care spanning beyond 2 midnights from the point of admission due to high intensity of service, high risk for further deterioration and high frequency of surveillance required.*  Time spent in minutes : [place]

## 2021-01-30 NOTE — Progress Notes (Addendum)
Patient admitted to the hospital earlier this morning by Dr. Clearence Ped  Patient seen and examined.  She is lying in bed, awake.  She does answer some questions appropriately, but then her speech often becomes tangential.  No caregiver at her bedside at this time.  She does have diminished breath sounds on the right side.  No significant lower extremity edema.  Abdominal exam is benign.  Assessment/plan  Community-acquired pneumonia -Started on ceftriaxone and azithromycin -Sputum culture/blood cultures pending -Urinary antigens been ordered  Large right-sided pleural effusion -Etiology is not clear -Possibly could be parapneumonic -She will need diagnostic/therapeutic thoracentesis -She is currently on room air  Right adnexal mass -Noted to have complex ovarian cyst on the right -There is concern for underlying malignancy -She will need further imaging with ultrasound/MRI once her pulmonary issues have been addressed and she is able to lie flat -We will need referral to oncology based on further imaging -We will check CEA and CA125  Pulmonary nodules -Noted on initial CT abdomen/pelvis -On repeat CT chest done today, nodules appear to be smaller in size indicating that he may be infectious/inflammatory  Hypokalemia -Replace  Schizophrenia -Continue outpatient psychotropics  Hepatitis B -Continue antiviral therapy  Social -Appears that she resides at a group home -Her guardian appears to be Department of Social Services  [employer]

## 2021-01-30 NOTE — Progress Notes (Signed)
   01/30/21 1706  Assess: MEWS Score  Temp 99.8 F (37.7 C)  BP 118/62  Pulse Rate (!) 117  Resp 16  SpO2 98 %  O2 Device Room Air  O2 Flow Rate (L/min) 0 L/min  Assess: MEWS Score  MEWS Temp 0  MEWS Systolic 0  MEWS Pulse 2  MEWS RR 0  MEWS LOC 0  MEWS Score 2  MEWS Score Color Yellow  Assess: if the MEWS score is Yellow or Red  Were vital signs taken at a resting state? Yes  Focused Assessment No change from prior assessment (per report from ED)  Early Detection of Sepsis Score *See Row Information* High  MEWS guidelines implemented *See Row Information* Yes  Treat  Pain Scale 0-10  Pain Score 0  Take Vital Signs  Increase Vital Sign Frequency  Yellow: Q 2hr X 2 then Q 4hr X 2, if remains yellow, continue Q 4hrs  Escalate  MEWS: Escalate Yellow: discuss with charge nurse/RN and consider discussing with provider and RRT  Notify: Charge Nurse/RN  Name of Charge Nurse/RN Notified Deirdre Pippins RN  Date Charge Nurse/RN Notified 01/30/21  Time Charge Nurse/RN Notified 27  Notify: Provider  Provider Name/Title Dr. Roderic Palau  Date Provider Notified 01/30/21  Time Provider Notified [id number]  Notification Type Page  Notification Reason Other (Comment) (yellow MEWS)

## 2021-01-31 DIAGNOSIS — B181 Chronic viral hepatitis B without delta-agent: Secondary | ICD-10-CM | POA: Diagnosis not present

## 2021-01-31 DIAGNOSIS — N83299 Other ovarian cyst, unspecified side: Secondary | ICD-10-CM | POA: Diagnosis not present

## 2021-01-31 DIAGNOSIS — J189 Pneumonia, unspecified organism: Secondary | ICD-10-CM | POA: Diagnosis not present

## 2021-01-31 LAB — COMPREHENSIVE METABOLIC PANEL
ALT: 22 U/L (ref 0–44)
AST: 21 U/L (ref 15–41)
Albumin: 1.8 g/dL — ABNORMAL LOW (ref 3.5–5.0)
Alkaline Phosphatase: 88 U/L (ref 38–126)
Anion gap: 8 (ref 5–15)
BUN: 7 mg/dL (ref 6–20)
CO2: 25 mmol/L (ref 22–32)
Calcium: 7.8 mg/dL — ABNORMAL LOW (ref 8.9–10.3)
Chloride: 104 mmol/L (ref 98–111)
Creatinine, Ser: 0.49 mg/dL (ref 0.44–1.00)
GFR, Estimated: >60 mL/min (ref >60)
Glucose, Bld: 100 mg/dL — ABNORMAL HIGH (ref 70–99)
Potassium: 3.5 mmol/L (ref 3.5–5.1)
Sodium: 137 mmol/L (ref 135–145)
Total Bilirubin: 0.4 mg/dL (ref 0.3–1.2)
Total Protein: 5 g/dL — ABNORMAL LOW (ref 6.5–8.1)

## 2021-01-31 LAB — CBC WITH DIFFERENTIAL/PLATELET
Basophils Absolute: 0 10*3/uL (ref 0.0–0.1)
Basophils Relative: 0 %
Eosinophils Absolute: 0 10*3/uL (ref 0.0–0.5)
Eosinophils Relative: 0 %
HCT: 28.1 % — ABNORMAL LOW (ref 36.0–46.0)
Hemoglobin: 9 g/dL — ABNORMAL LOW (ref 12.0–15.0)
Lymphocytes Relative: 3 %
Lymphs Abs: 1.2 10*3/uL (ref 0.7–4.0)
MCH: 28 pg (ref 26.0–34.0)
MCHC: 32 g/dL (ref 30.0–36.0)
MCV: 87.5 fL (ref 80.0–100.0)
Monocytes Absolute: 3.1 10*3/uL — ABNORMAL HIGH (ref 0.1–1.0)
Monocytes Relative: 8 %
Neutro Abs: 34.6 10*3/uL — ABNORMAL HIGH (ref 1.7–7.7)
Neutrophils Relative %: 89 %
Platelets: 372 10*3/uL (ref 150–400)
RBC: 3.21 MIL/uL — ABNORMAL LOW (ref 3.87–5.11)
RDW: 13.4 % (ref 11.5–15.5)
WBC: 38.9 10*3/uL — ABNORMAL HIGH (ref 4.0–10.5)
nRBC: 0 % (ref 0.0–0.2)

## 2021-01-31 LAB — HIV ANTIBODY (ROUTINE TESTING W REFLEX): HIV Screen 4th Generation wRfx: NONREACTIVE

## 2021-01-31 LAB — MAGNESIUM: Magnesium: 1.9 mg/dL (ref 1.7–2.4)

## 2021-01-31 LAB — STREP PNEUMONIAE URINARY ANTIGEN: Strep Pneumo Urinary Antigen: NEGATIVE

## 2021-01-31 MED ORDER — ACETAMINOPHEN 325 MG PO TABS
650.0000 mg | ORAL_TABLET | Freq: Four times a day (QID) | ORAL | Status: DC | PRN
  Administered 2021-01-31 – 2021-02-17 (×8): 650 mg via ORAL
  Filled 2021-01-31 (×8): qty 2

## 2021-01-31 NOTE — H&P (Signed)
NAME:  Margaret [name], MRN:  [id number], DOB:  October 21, 1970, LOS: 1 ADMISSION DATE:  01/29/2021, CONSULTATION DATE:  10/30 REFERRING MD:  Roderic Palau, REASON FOR CONSULT: pleural effusion  History of Present Illness:  Margaret [name] is a 50 y.o. female who is seen in consultation at the request of Dr. Roderic Palau for recommendations on further evaluation and management of pleural effusion.   Margaret [name], is a 50 y.o. female, who presented to the [facility] ED on 10/29 with a chief complaint of difficulty with ambulation  They have a pertinent past medical history of ovarian cancer, hep B carrier, intelectual disability, TB (childhood), currently lives in group home.  ED course was notable for workup for a worsening cough which had been present for two days, CT chest demonstrated a large partially loculated right pleural effusion with atelectasis of the right upper lobe. She was also found to have a temp of 100.9, WBC 31.9, HBG 9.3.  She was admitted to the hopsitalist service and treatment was initiated for CAP. She was initially started on doxy but is now on Ceftriaxone and azithromycin   Appears that the patient was accepted to [facility] service.   Pertinent  Medical History  ovarian cancer, hep B carrier, intelectual disability, TB (childhood), currently lives in group home  [facility] Events: Including procedures, antibiotic start and stop dates in addition to other pertinent events   10/29 Admit to [facility] CT chest shows R pleural effusion. 10/30-31 TXR to [facility].  Interim History / Subjective:   No distress. Resting comfortably in bed. She states that she has monster in her brain and that she hopes we can fix them while she is here. And that once she leaves she would like to go on a shopping trip.   Objective   Blood pressure (!) 95/55, pulse 100, temperature 98.3 F (36.8 C), resp. rate 19, height 5' 4"  (1.626 m), weight 48.5 kg, SpO2 93 %.        Intake/Output Summary (Last 24 hours) at  01/31/2021 2046 Last data filed at 01/31/2021 1500 Gross per 24 hour  Intake 2458.47 ml  Output --  Net 2458.47 ml   Filed Weights   01/29/21 2307  Weight: 48.5 kg    Examination: General appearance: 50 y.o., female, NAD, conversant  HENT: NCAT tracking  Neck: Trachea midline; FROM, supple, lymphadenopathy, no JVD Lungs: absent breath sounds in the right base  CV: RRR, S1, S2, no MRGs  Abdomen: Soft NT ND  Neuro: [facility] Problem list     Assessment & Plan:   Large right-sided pleural effusion, loculated  Elevated CA19-9 As seen on 10/29 CT, etiology unclear, lives in group home, documented hx of tb per chart. ?infectious vs inflammatory process. - plan for CT placement in AM, she looks non-toxic and I think can wait until morning when the full team is here and we can safely do this at bedside.  - discussed with patient  - will need consent from guardian - we will arrange to complete on the floor in the AM.  - please send for usual fluid studies and cytology   CAP WBC 38.9 - continue Abx for now   Right adrenal mass Schizophrenia, controlled  Hypokalemia Hep B Normocytic Anemia Malnutrition-unspecified  Best Practice (right click and "Reselect all SmartList Selections" daily)   Per primary  Labs   CBC: Recent Labs  Lab 01/30/21 0016 01/31/21 0457  WBC 31.9* 38.9*  NEUTROABS  --  34.6*  HGB 9.3*  9.0*  HCT 29.5* 28.1*  MCV 87.5 87.5  PLT 356 564    Basic Metabolic Panel: Recent Labs  Lab 01/30/21 0016 01/31/21 0457  NA 133* 137  K 3.2* 3.5  CL 98 104  CO2 28 25  GLUCOSE 137* 100*  BUN 9 7  CREATININE 0.61 0.49  CALCIUM 8.3* 7.8*  MG  --  1.9   GFR: Estimated Creatinine Clearance: 65.8 mL/min (by C-G formula based on SCr of 0.49 mg/dL). Recent Labs  Lab 01/30/21 0016 01/30/21 0325 01/30/21 0557 01/31/21 0457  WBC 31.9*  --   --  38.9*  LATICACIDVEN  --  0.7 0.9  --     Liver Function Tests: Recent Labs  Lab  01/30/21 0016 01/31/21 0457  AST 19 21  ALT 22 22  ALKPHOS 87 88  BILITOT 0.3 0.4  PROT 5.6* 5.0*  ALBUMIN 2.1* 1.8*   Recent Labs  Lab 01/30/21 0016  LIPASE 26   No results for input(s): AMMONIA in the last 168 hours.  ABG No results found for: PHART, PCO2ART, PO2ART, HCO3, TCO2, ACIDBASEDEF, O2SAT   Coagulation Profile: No results for input(s): INR, PROTIME in the last 168 hours.  Cardiac Enzymes: No results for input(s): CKTOTAL, CKMB, CKMBINDEX, TROPONINI in the last 168 hours.  HbA1C: No results found for: HGBA1C  CBG: No results for input(s): GLUCAP in the last 168 hours.   Review of Systems:   Unable to obtained in full.  Patient does have cough, she is confused at times  Past Medical History:  She,  has a past medical history of Hematuria (11/12/2014), Hepatitis B carrier ([place]), Hypothyroidism, Impulse disorder, IUD (intrauterine device) in place (11/12/2014), Mild intellectual disability, Schizophrenia ([place]), Schizophrenia ([place]), Tuberculosis, and Urinary frequency (11/12/2014).   Surgical History:   Past Surgical History:  Procedure Laterality Date   ARM WOUND REPAIR / CLOSURE     TRACHEAL SURGERY     TRACHEOSTOMY       Social History:   reports that she has never smoked. She has never used smokeless tobacco. She reports that she does not drink alcohol and does not use drugs.   Family History:  Her She was adopted. Family history is unknown by patient.   Allergies No Known Allergies   Home Medications  Prior to Admission medications   Medication Sig Start Date End Date Taking? Authorizing Provider  acetaminophen (TYLENOL) 650 MG CR tablet Take 650 mg by mouth every 8 (eight) hours as needed for pain.   Yes [provider]  albuterol (VENTOLIN HFA) 108 (90 Base) MCG/ACT inhaler INHALE 2 PUFFS EVERY 4 HOURS AS NEEDED FOR WHEEZING. 07/30/20  Yes Dettinger, Fransisca Kaufmann, MD  benztropine (COGENTIN) 1 MG tablet Take 1 tablet (1 mg total) by mouth 2  (two) times daily. 01/08/20  Yes Dettinger, Fransisca Kaufmann, MD  chlorproMAZINE (THORAZINE) 50 MG tablet Take 50 mg by mouth as needed. One tablet by mouth PRN agitation, max of 2 tablets, 100 mg in 24 hours   Yes [provider]  Cholecalciferol (VITAMIN D3) 50 MCG (2000 UT) capsule TAKE 1 CAPSULE BY MOUTH EVERY MORNING. 06/11/20  Yes Dettinger, Fransisca Kaufmann, MD  cloZAPine (CLOZARIL) 100 MG tablet Take 200 mg by mouth at bedtime.   Yes [provider]  entecavir (BARACLUDE) 0.5 MG tablet TAKE (1) TABLET BY MOUTH ONCE DAILY. 09/02/20  Yes Harvel Quale, MD  Fish Oil-Cholecalciferol (FISH OIL + D3) 1200-1000 MG-UNIT CAPS TAKE 1 CAPSULE BY MOUTH TWICE DAILY. 03/05/20  Yes Dettinger, Fransisca Kaufmann, MD  FLUoxetine (PROZAC) 20 MG capsule TAKE 3 CAPSULES BY MOUTH ONCE DAILY. 12/03/20  Yes Dettinger, Fransisca Kaufmann, MD  fluticasone (FLONASE) 50 MCG/ACT nasal spray SPRAY 2 SPRAYS IN RIGHT NOSTRIL ONLY TWICE DAILY. 10/04/19  Yes Dettinger, Fransisca Kaufmann, MD  ibuprofen (ADVIL) 600 MG tablet TAKE 1 TABLET BY MOUTH WITH FOOD 4 TIMES DAILY AS NEEDED FOR ACTIVE MODERATE PAIN UNRELIEVED BY ACETAMINOPHEN. MAX 2 DOSES/24 HR. 05/20/19  Yes Dettinger, Fransisca Kaufmann, MD  levonorgestrel (MIRENA) 20 MCG/24HR IUD 1 each by Intrauterine route once.   Yes [provider]  mirtazapine (REMERON) 7.5 MG tablet Take 7.5 mg by mouth at bedtime. 10/28/20  Yes [provider]  Multiple Vitamins-Minerals (CENTROVITE) TABS TAKE 1 TABLET BY MOUTH AT BEDTIME. 05/04/20  Yes Dettinger, Fransisca Kaufmann, MD  polyethylene glycol powder (GLYCOLAX/MIRALAX) 17 GM/SCOOP powder Take 17 g by mouth daily. Patient taking differently: Take 17 g by mouth daily as needed. 01/21/21  Yes Rehman, Mechele Dawley, MD  Skin Protectants, Misc. (MINERIN CREME) CREA APPLY MODERATE AMOUNT TOPICALLY TO DRY SKIN ON SOLES & HEELS OF BOTH FEET AFTER SHOWER. (NURSING STAFF TO SUPERVISE APPLICATION) 16/10/96  Yes Dettinger, Fransisca Kaufmann, MD  vitamin E 200 UNIT capsule Take 1  capsule (200 Units total) by mouth daily. 05/13/20  Yes Dettinger, Fransisca Kaufmann, MD  DULoxetine (CYMBALTA) 60 MG capsule Take by mouth. Patient not taking: No sig reported 07/01/20   [provider]  hydrOXYzine (VISTARIL) 25 MG capsule Take 1 capsule (25 mg total) by mouth every 8 (eight) hours as needed for anxiety. Patient not taking: No sig reported 01/25/21   Dettinger, Fransisca Kaufmann, MD     Garner Nash, DO [place] [facility] 02/01/2021 6:47 PM

## 2021-01-31 NOTE — Progress Notes (Signed)
Pt moved to room #334 due to toilet malfunction in prior room. Pt agreeable and states understanding. MD Memon in to see pt and evaluate. Pt drinking multiple sodas and ate fruit cup but no other food taken from lunch tray. IVF stopped per MD Memon. Pt currently denies any c/o pain or SOB, resp rate 24-26/min, crackles noted bilat with diminished breath sounds on right. Pt with congested cough, coughing up green colored phlegm, small amounts.

## 2021-01-31 NOTE — Progress Notes (Signed)
PROGRESS NOTE    Margaret [name]  ZOX:[id number] DOB: [birth year] DOA: 01/29/2021 PCP: Dettinger, Fransisca Kaufmann, MD    Brief Narrative:  50 year old female with a history of schizophrenia/impulse disorder/intellectual disability who is a resident of a group home and has an appointed guardian, was brought to the hospital with difficulty with ambulation.  Work-up showed that she had a large right-sided pleural effusion with loculations.  She was started on antibiotics.  Case was reviewed with pulmonology who felt she would be best served by transfer to [facility] and would likely need chest tube with thrombolytics for further management.  Dr. Vaughan Browner has kindly accepted the patient onto the PCCM service.   Assessment & Plan:   Active Problems:   Chronic hepatitis B (HCC)   Schizophrenia ([place])   Complex ovarian cyst   Multiple pulmonary nodules   Community acquired pneumonia   Pleural effusion   Hypokalemia   Leukocytosis   Community-acquired pneumonia -Started on ceftriaxone and azithromycin -Sputum culture/blood cultures pending -Urinary antigens been ordered   Large right-sided pleural effusion -Etiology is not clear -Possibly could be parapneumonic -Discussed with pulmonology who felt it was best for patient transfer to [facility].  She will need chest tube placement and will likely need thrombolytics for loculations. -Case was also discussed with Dr. Kipp Brood, CVTS who agreed with chest tube placement for initial evaluation.  He also agreed with transfer to [facility] in case patient will need further management in the operating room. -She is currently on room air with normal pulse ox, but does have increased respiratory effort.   Right adnexal mass -Noted to have complex ovarian cyst on the right -There is concern for underlying malignancy -She will need further imaging with transvaginal ultrasound/MRI pelvis once her pulmonary issues have been addressed and she is able  to lie flat -She will need referral to oncology based on further imaging -We will check CEA and CA125   Pulmonary nodules -Noted on initial CT abdomen/pelvis from 10/20 -On repeat CT chest done on admission, nodules appear to be smaller in size indicating that he may be infectious/inflammatory   Hypokalemia -Replace   Schizophrenia/baseline cognitive impairment -Continue outpatient psychotropics   Hepatitis B -Continue antiviral therapy   Social -Appears that she resides at a group home -Her guardian is through [facility] [employer] [facility] -Due to patient's cognitive deficits, her guardian plans on discussing pelvic findings with the patient once further information is available, since plan of care has to be discussed with the patient in a way that she can comprehend   DVT prophylaxis: heparin injection 5,000 Units Start: 01/30/21 1400 SCDs Start: 01/30/21 1003  Code Status: Full code Family Communication: Patient's guardian Marzetta Board) at [place] [facility] [employer] [phone], was updated on the plan for patient to move to [facility].  She is agreeable for transfer and also consented for chest tube placement.  She wishes to be contacted once patient arrives to [facility] Disposition Plan: Status is: Inpatient  Remains inpatient appropriate because: Transfer to [facility] for further management of pleural effusion.  Will likely need chest tube.         Consultants:  Pulmonology  Procedures:    Antimicrobials:  Ceftriaxone 10/29> Azithromycin 10/29 >   Subjective: Patient is lying in bed.  She denies any significant complaints or shortness of breath.  She does describe cough productive of greenish sputum.  Although she denies shortness of breath, she is noted to have increased respiratory effort objectively.  Objective: Vitals:  01/31/21 0600 01/31/21 1401 01/31/21 1833 01/31/21 1959  BP:  115/62 (!) 103/56 (!) 95/55  Pulse: (!) 109 (!) 109 (!) 111 100   Resp:  (!) 24 (!) 24 19  Temp:  98.8 F (37.1 C) 99.3 F (37.4 C) 98.3 F (36.8 C)  TempSrc:  Oral Oral   SpO2:  91% 94% 93%  Weight:      Height:        Intake/Output Summary (Last 24 hours) at 01/31/2021 2057 Last data filed at 01/31/2021 1500 Gross per 24 hour  Intake 2458.47 ml  Output --  Net 2458.47 ml   Filed Weights   01/29/21 2307  Weight: 48.5 kg    Examination:  General exam: Appears calm and comfortable  Respiratory system: Diminished breath sounds on right side, bilateral crackles at bases.  Mild increased respiratory effort Cardiovascular system: S1 & S2 heard, RRR. No JVD, murmurs, rubs, gallops or clicks. No pedal edema. Gastrointestinal system: Abdomen is nondistended, soft and nontender. No organomegaly or masses felt. Normal bowel sounds heard. Central nervous system:  No focal neurological deficits. Extremities: Symmetric 5 x 5 power. Skin: No rashes, lesions or ulcers Psychiatry: Speech is tangential at times, she is otherwise pleasant    Data Reviewed: I have personally reviewed following labs and imaging studies  CBC: Recent Labs  Lab 01/30/21 0016 01/31/21 0457  WBC 31.9* 38.9*  NEUTROABS  --  34.6*  HGB 9.3* 9.0*  HCT 29.5* 28.1*  MCV 87.5 87.5  PLT 356 030   Basic Metabolic Panel: Recent Labs  Lab 01/30/21 0016 01/31/21 0457  NA 133* 137  K 3.2* 3.5  CL 98 104  CO2 28 25  GLUCOSE 137* 100*  BUN 9 7  CREATININE 0.61 0.49  CALCIUM 8.3* 7.8*  MG  --  1.9   GFR: Estimated Creatinine Clearance: 65.8 mL/min (by C-G formula based on SCr of 0.49 mg/dL). Liver Function Tests: Recent Labs  Lab 01/30/21 0016 01/31/21 0457  AST 19 21  ALT 22 22  ALKPHOS 87 88  BILITOT 0.3 0.4  PROT 5.6* 5.0*  ALBUMIN 2.1* 1.8*   Recent Labs  Lab 01/30/21 0016  LIPASE 26   No results for input(s): AMMONIA in the last 168 hours. Coagulation Profile: No results for input(s): INR, PROTIME in the last 168 hours. Cardiac Enzymes: No  results for input(s): CKTOTAL, CKMB, CKMBINDEX, TROPONINI in the last 168 hours. BNP (last 3 results) No results for input(s): PROBNP in the last 8760 hours. HbA1C: No results for input(s): HGBA1C in the last 72 hours. CBG: No results for input(s): GLUCAP in the last 168 hours. Lipid Profile: No results for input(s): CHOL, HDL, LDLCALC, TRIG, CHOLHDL, LDLDIRECT in the last 72 hours. Thyroid Function Tests: No results for input(s): TSH, T4TOTAL, FREET4, T3FREE, THYROIDAB in the last 72 hours. Anemia Panel: No results for input(s): VITAMINB12, FOLATE, FERRITIN, TIBC, IRON, RETICCTPCT in the last 72 hours. Sepsis Labs: Recent Labs  Lab 01/30/21 0325 01/30/21 0557  LATICACIDVEN 0.7 0.9    Recent Results (from the past 240 hour(s))  Resp Panel by RT-PCR (Flu A&B, Covid) Nasopharyngeal Swab     Status: None   Collection Time: 01/30/21  3:14 AM   Specimen: Nasopharyngeal Swab; Nasopharyngeal(NP) swabs in vial transport medium  Result Value Ref Range Status   SARS Coronavirus 2 by RT PCR NEGATIVE NEGATIVE Final    Comment: (NOTE) SARS-CoV-2 target nucleic acids are NOT DETECTED.  The SARS-CoV-2 RNA is generally detectable in upper respiratory  specimens during the acute phase of infection. The lowest concentration of SARS-CoV-2 viral copies this assay can detect is 138 copies/mL. A negative result does not preclude SARS-Cov-2 infection and should not be used as the sole basis for treatment or other patient management decisions. A negative result may occur with  improper specimen collection/handling, submission of specimen other than nasopharyngeal swab, presence of viral mutation(s) within the areas targeted by this assay, and inadequate number of viral copies(<138 copies/mL). A negative result must be combined with clinical observations, patient history, and epidemiological information. The expected result is Negative.  Fact Sheet for Patients:   EntrepreneurPulse.com.au  Fact Sheet for Healthcare Providers:  IncredibleEmployment.be  This test is no t yet approved or cleared by the [place] FDA and  has been authorized for detection and/or diagnosis of SARS-CoV-2 by FDA under an Emergency Use Authorization (EUA). This EUA will remain  in effect (meaning this test can be used) for the duration of the COVID-19 declaration under Section 564(b)(1) of the Act, 21 U.S.C.section 360bbb-3(b)(1), unless the authorization is terminated  or revoked sooner.       Influenza A by PCR NEGATIVE NEGATIVE Final   Influenza B by PCR NEGATIVE NEGATIVE Final    Comment: (NOTE) The Xpert Xpress SARS-CoV-2/FLU/RSV plus assay is intended as an aid in the diagnosis of influenza from Nasopharyngeal swab specimens and should not be used as a sole basis for treatment. Nasal washings and aspirates are unacceptable for Xpert Xpress SARS-CoV-2/FLU/RSV testing.  Fact Sheet for Patients: EntrepreneurPulse.com.au  Fact Sheet for Healthcare Providers: IncredibleEmployment.be  This test is not yet approved or cleared by the [place] FDA and has been authorized for detection and/or diagnosis of SARS-CoV-2 by FDA under an Emergency Use Authorization (EUA). This EUA will remain in effect (meaning this test can be used) for the duration of the COVID-19 declaration under Section 564(b)(1) of the Act, 21 U.S.C. section 360bbb-3(b)(1), unless the authorization is terminated or revoked.  Performed at [facility], [address] [place], [place] [address]   Culture, blood (routine x 2)     Status: None (Preliminary result)   Collection Time: 01/30/21  6:58 AM   Specimen: BLOOD  Result Value Ref Range Status   Specimen Description BLOOD  Final   Special Requests NONE  Final   Culture   Final    NO GROWTH 1 DAY Performed at [facility], [address] [place], [place]  [address]    Report Status PENDING  Incomplete  Culture, blood (routine x 2)     Status: None (Preliminary result)   Collection Time: 01/30/21  7:13 AM   Specimen: BLOOD  Result Value Ref Range Status   Specimen Description BLOOD  Final   Special Requests NONE  Final   Culture   Final    NO GROWTH 1 DAY Performed at [facility], [address] [place], [place] [address]    Report Status PENDING  Incomplete         Radiology Studies: CT Chest W Contrast  Result Date: 01/30/2021 CLINICAL DATA:  Multiple pulmonary nodules suspicious for pulmonary metastatic disease. EXAM: CT CHEST WITH CONTRAST TECHNIQUE: Multidetector CT imaging of the chest was performed during intravenous contrast administration. CONTRAST:  57m OMNIPAQUE IOHEXOL 350 MG/ML SOLN COMPARISON:  CT abdomen pelvis 01/20/2021 FINDINGS: Cardiovascular: Extensive multi-vessel coronary artery calcification. Global cardiac size within normal limits. Small pericardial effusion is present, enlarged since prior examination. The central pulmonary arteries are of normal caliber. Mild atherosclerotic  calcification within the thoracic aorta. No aortic aneurysm. Mediastinum/Nodes: Large right pleural effusion results in new, mild mediastinal shift to the left. No pathologic thoracic adenopathy. The esophagus is unremarkable. Visualized thyroid is unremarkable. Lungs/Pleura: Since the prior examination, a large, partially loculated right pleural effusion has developed with near complete collapse of the right middle and lower lobes and compressive atelectasis of the right upper lobe. There is superimposed patchy multifocal pulmonary ground-glass infiltrate and peripheral consolidation within the aerated lungs which appears progressive since prior examination within the visualized lung bases. Previously noted pulmonary nodules appear to have decreased in the interval, with the index lesion within the left lung base previously measuring 22 x 25 mm now  measuring 14 mm x 20 mm. This suggests an evolving infectious or inflammatory process. Mild superimposed centrilobular emphysema. No pneumothorax. No central obstructing mass., Upper Abdomen: Moderate stool within the visualized splenic flexure. No acute abnormality. Musculoskeletal: No acute bone abnormality. No lytic or blastic bone lesions. Multiple healed bilateral rib fractures, bilateral clavicle fractures, and left scapular fracture are noted. IMPRESSION: Interval development of a large partially loculated right pleural effusion with near complete collapse of the right lung base in atelectasis of the right upper lobe and resultant mild mediastinal shift to the left. Evolving infectious or inflammatory process within the aerated lungs with interval decrease in size of focal pulmonary nodules noted on prior examination, but progressive peripheral consolidation and ground-glass pulmonary infiltrate demonstrating a basilar predominance. Differential considerations are led by acute infection though pulmonary vasculitides or drug reaction could appear similarly. Diagnostic and therapeutic thoracentesis of the large right pleural effusion may be helpful for further management. Extensive multi-vessel coronary artery calcification. Mild emphysema Aortic Atherosclerosis (ICD10-I70.0) and Emphysema (ICD10-J43.9). Electronically Signed   By: Fidela Salisbury M.D.   On: 01/30/2021 04:16        Scheduled Meds:  acetaminophen  1,000 mg Oral Once   benztropine  1 mg Oral BID   cloZAPine  250 mg Oral QHS   DULoxetine  60 mg Oral Daily   entecavir  0.5 mg Oral Daily   feeding supplement  237 mL Oral BID BM   heparin  5,000 Units Subcutaneous Q8H   mirtazapine  7.5 mg Oral QHS   Continuous Infusions:  azithromycin 500 mg (01/31/21 0618)   cefTRIAXone (ROCEPHIN)  IV 2 g (01/31/21 0509)     LOS: 1 day    Time spent: 29mns    JKathie Dike MD [facility]   If 7PM-7AM, please contact  night-coverage [email or web address]  01/31/2021, 8:57 PM

## 2021-01-31 NOTE — TOC Initial Note (Signed)
Transition of Care [facility]) - Initial/Assessment Note    Patient Details  Name: Margaret [name] MRN: [id number] Date of Birth: January 19, 1971  Transition [facility]) CM/SW Contact:    Natasha Bence, LCSW Phone Number: 01/31/2021, 3:45 PM  Clinical Narrative:                 Patient is a 50 year old female admitted for Pleural effusion. CSW contacted [facility] to obtain information for patient's Legal Guardian. Levonne Hubert at [phone] with [facility] reported that they Legal guardian is [employer] and the Crisis line to obtain permission for procedure is [phone]. CSW contacted [employer] Crisis line. On call worker granted permission for thoracentesis and possible [facility] transport. [employer] reported that they will need to be notified if patient is transport. MD notified. TOC to follow.   Expected Discharge Plan: Group Home Barriers to Discharge: Continued Medical Work up   Patient Goals and CMS Choice   CMS Medicare.gov Compare Post Acute Care list provided to:: Patient Represenative (must comment) Choice offered to / list presented to : [facility] POA / Guardian  Expected Discharge Plan and Services Expected Discharge Plan: Group Home       Living arrangements for the past 2 months: Group Home                                      Prior Living Arrangements/Services Living arrangements for the past 2 months: Group Home Lives with:: Facility Resident Patient language and need for interpreter reviewed:: Yes Do you feel safe going back to the place where you live?: Yes      Need for Family Participation in Patient Care: Yes (Comment) Care giver support system in place?: Yes (comment)   Criminal Activity/Legal Involvement Pertinent to Current Situation/Hospitalization: No - Comment as needed  Activities of Daily Living Home Assistive Devices/Equipment: Other (Comment) (Patient unsure) ADL Screening (condition at time of  admission) Patient's cognitive ability adequate to safely complete daily activities?: No Is the patient deaf or have difficulty hearing?: No Does the patient have difficulty seeing, even when wearing glasses/contacts?: No Does the patient have difficulty concentrating, remembering, or making decisions?: No Patient able to express need for assistance with ADLs?: Yes Does the patient have difficulty dressing or bathing?: Yes Independently performs ADLs?: No Communication: Independent Dressing (OT): Needs assistance Is this a change from baseline?: Pre-admission baseline Grooming: Needs assistance Is this a change from baseline?: Pre-admission baseline Feeding: Independent Bathing: Needs assistance Is this a change from baseline?: Pre-admission baseline Toileting: Needs assistance Is this a change from baseline?: Pre-admission baseline In/Out Bed: Needs assistance Is this a change from baseline?: Pre-admission baseline Walks in Home: Independent Does the patient have difficulty walking or climbing stairs?: Yes Weakness of Legs: Both Weakness of Arms/Hands: None  Permission Sought/Granted Permission sought to share information with : Family Supports, Guardian Permission granted to share information with : Yes, Verbal Permission Granted  Share Information with NAME: [employer]  Permission granted to share info w AGENCY: [facility]  Permission granted to share info w Relationship: Guardianship  Permission granted to share info w Contact Information: [phone]  Emotional Assessment       Orientation: : Oriented to Self, Oriented to Situation, Oriented to Place, Oriented to  Time Alcohol / Substance Use: Not Applicable Psych Involvement: No (comment)  Admission diagnosis:  Pleural effusion [  J90] Community acquired pneumonia [J18.9] Community acquired pneumonia of right middle lobe of lung [J18.9] Community acquired pneumonia, unspecified laterality  [J18.9] Patient Active Problem List   Diagnosis Date Noted   Community acquired pneumonia 01/30/2021   Pleural effusion 01/30/2021   Hypokalemia 01/30/2021   Psychiatric disorder 01/30/2021   Leukocytosis 01/30/2021   Complex ovarian cyst 01/25/2021   Multiple pulmonary nodules 01/25/2021   RLQ abdominal mass 01/05/2021   Vitamin D deficiency 11/30/2020   Weight loss 03/19/2019   Schizophrenia ([place])    Hepatitis B    Hyperlipidemia 12/18/2014   History of tuberculosis 11/17/2014   Orthostatic hypotension 11/17/2014   Right nasal polyps 11/17/2014   IUD (intrauterine device) in place 11/12/2014   Chronic hepatitis B ([place]) 11/10/2014   PCP:  Dettinger, Fransisca Kaufmann, MD Pharmacy:   [facility], [place] [place] [address] Phone: [phone] Fax: [phone]     Social Determinants of Health (SDOH) Interventions    Readmission Risk Interventions No flowsheet data found.

## 2021-02-01 ENCOUNTER — Inpatient Hospital Stay (HOSPITAL_COMMUNITY): Payer: Medicare Other

## 2021-02-01 DIAGNOSIS — J90 Pleural effusion, not elsewhere classified: Secondary | ICD-10-CM | POA: Diagnosis not present

## 2021-02-01 DIAGNOSIS — J810 Acute pulmonary edema: Secondary | ICD-10-CM | POA: Diagnosis not present

## 2021-02-01 DIAGNOSIS — E876 Hypokalemia: Secondary | ICD-10-CM

## 2021-02-01 DIAGNOSIS — J9601 Acute respiratory failure with hypoxia: Secondary | ICD-10-CM | POA: Diagnosis not present

## 2021-02-01 DIAGNOSIS — J189 Pneumonia, unspecified organism: Secondary | ICD-10-CM | POA: Diagnosis not present

## 2021-02-01 DIAGNOSIS — J432 Centrilobular emphysema: Secondary | ICD-10-CM

## 2021-02-01 DIAGNOSIS — B181 Chronic viral hepatitis B without delta-agent: Secondary | ICD-10-CM

## 2021-02-01 DIAGNOSIS — N83299 Other ovarian cyst, unspecified side: Secondary | ICD-10-CM

## 2021-02-01 DIAGNOSIS — F201 Disorganized schizophrenia: Secondary | ICD-10-CM

## 2021-02-01 DIAGNOSIS — R918 Other nonspecific abnormal finding of lung field: Secondary | ICD-10-CM

## 2021-02-01 LAB — RESPIRATORY PANEL BY PCR

## 2021-02-01 LAB — COMPREHENSIVE METABOLIC PANEL
ALT: 20 U/L (ref 0–44)
ALT: 25 U/L (ref 0–44)
AST: 21 U/L (ref 15–41)
AST: 28 U/L (ref 15–41)
Albumin: 1.5 g/dL — ABNORMAL LOW (ref 3.5–5.0)
Albumin: 1.5 g/dL — ABNORMAL LOW (ref 3.5–5.0)
Alkaline Phosphatase: 78 U/L (ref 38–126)
Alkaline Phosphatase: 94 U/L (ref 38–126)
Anion gap: 8 (ref 5–15)
Anion gap: 10 (ref 5–15)
BUN: 7 mg/dL (ref 6–20)
BUN: 5 mg/dL — ABNORMAL LOW (ref 6–20)
CO2: 24 mmol/L (ref 22–32)
CO2: 24 mmol/L (ref 22–32)
Calcium: 7.4 mg/dL — ABNORMAL LOW (ref 8.9–10.3)
Calcium: 7.6 mg/dL — ABNORMAL LOW (ref 8.9–10.3)
Chloride: 100 mmol/L (ref 98–111)
Chloride: 100 mmol/L (ref 98–111)
Creatinine, Ser: 0.57 mg/dL (ref 0.44–1.00)
Creatinine, Ser: 0.64 mg/dL (ref 0.44–1.00)
GFR, Estimated: >60 mL/min (ref >60)
GFR, Estimated: >60 mL/min (ref >60)
Glucose, Bld: 189 mg/dL — ABNORMAL HIGH (ref 70–99)
Glucose, Bld: 159 mg/dL — ABNORMAL HIGH (ref 70–99)
Potassium: 3 mmol/L — ABNORMAL LOW (ref 3.5–5.1)
Potassium: 3.3 mmol/L — ABNORMAL LOW (ref 3.5–5.1)
Sodium: 132 mmol/L — ABNORMAL LOW (ref 135–145)
Sodium: 134 mmol/L — ABNORMAL LOW (ref 135–145)
Total Bilirubin: 0.4 mg/dL (ref 0.3–1.2)
Total Bilirubin: 0.4 mg/dL (ref 0.3–1.2)
Total Protein: 4.6 g/dL — ABNORMAL LOW (ref 6.5–8.1)
Total Protein: 5.3 g/dL — ABNORMAL LOW (ref 6.5–8.1)

## 2021-02-01 LAB — CBC
HCT: 23.7 % — ABNORMAL LOW (ref 36.0–46.0)
HCT: 26.9 % — ABNORMAL LOW (ref 36.0–46.0)
Hemoglobin: 7.8 g/dL — ABNORMAL LOW (ref 12.0–15.0)
Hemoglobin: 8.6 g/dL — ABNORMAL LOW (ref 12.0–15.0)
MCH: 28.7 pg (ref 26.0–34.0)
MCH: 27.2 pg (ref 26.0–34.0)
MCHC: 32.9 g/dL (ref 30.0–36.0)
MCHC: 32 g/dL (ref 30.0–36.0)
MCV: 87.1 fL (ref 80.0–100.0)
MCV: 85.1 fL (ref 80.0–100.0)
Platelets: 329 10*3/uL (ref 150–400)
Platelets: 349 10*3/uL (ref 150–400)
RBC: 2.72 MIL/uL — ABNORMAL LOW (ref 3.87–5.11)
RBC: 3.16 MIL/uL — ABNORMAL LOW (ref 3.87–5.11)
RDW: 13.5 % (ref 11.5–15.5)
RDW: 13.4 % (ref 11.5–15.5)
WBC: 34.1 10*3/uL — ABNORMAL HIGH (ref 4.0–10.5)
WBC: 38.4 10*3/uL — ABNORMAL HIGH (ref 4.0–10.5)
nRBC: 0 % (ref 0.0–0.2)
nRBC: 0 % (ref 0.0–0.2)

## 2021-02-01 LAB — GLUCOSE, CAPILLARY: Glucose-Capillary: 132 mg/dL — ABNORMAL HIGH (ref 70–99)

## 2021-02-01 LAB — BLOOD GAS, ARTERIAL
Acid-Base Excess: 1.7 mmol/L (ref 0.0–2.0)
Bicarbonate: 25 mmol/L (ref 20.0–28.0)
Drawn by: 56037
FIO2: 100
O2 Saturation: 89.2 %
Patient temperature: 37
pCO2 arterial: 34.4 mmHg (ref 32.0–48.0)
pH, Arterial: 7.475 — ABNORMAL HIGH (ref 7.350–7.450)
pO2, Arterial: 56.5 mmHg — ABNORMAL LOW (ref 83.0–108.0)

## 2021-02-01 LAB — PROCALCITONIN: Procalcitonin: 0.65 ng/mL

## 2021-02-01 LAB — BRAIN NATRIURETIC PEPTIDE: B Natriuretic Peptide: 70.2 pg/mL (ref 0.0–100.0)

## 2021-02-01 LAB — TROPONIN I (HIGH SENSITIVITY): Troponin I (High Sensitivity): 8 ng/L (ref <18)

## 2021-02-01 LAB — CEA: CEA: 1.9 ng/mL (ref 0.0–4.7)

## 2021-02-01 LAB — LEGIONELLA PNEUMOPHILA SEROGP 1 UR AG: L. pneumophila Serogp 1 Ur Ag: NEGATIVE

## 2021-02-01 LAB — LACTIC ACID, PLASMA: Lactic Acid, Venous: 1.5 mmol/L (ref 0.5–1.9)

## 2021-02-01 LAB — CA 125: Cancer Antigen (CA) 125: 111 U/mL — ABNORMAL HIGH (ref 0.0–38.1)

## 2021-02-01 IMAGING — DX DG CHEST 1V PORT
1 series · 1 of 1 positions shown · non-contrast
Comparison: CT [DATE]

CLINICAL DATA: Dyspnea.

EXAM:
PORTABLE CHEST 1 VIEW

[chest ap]
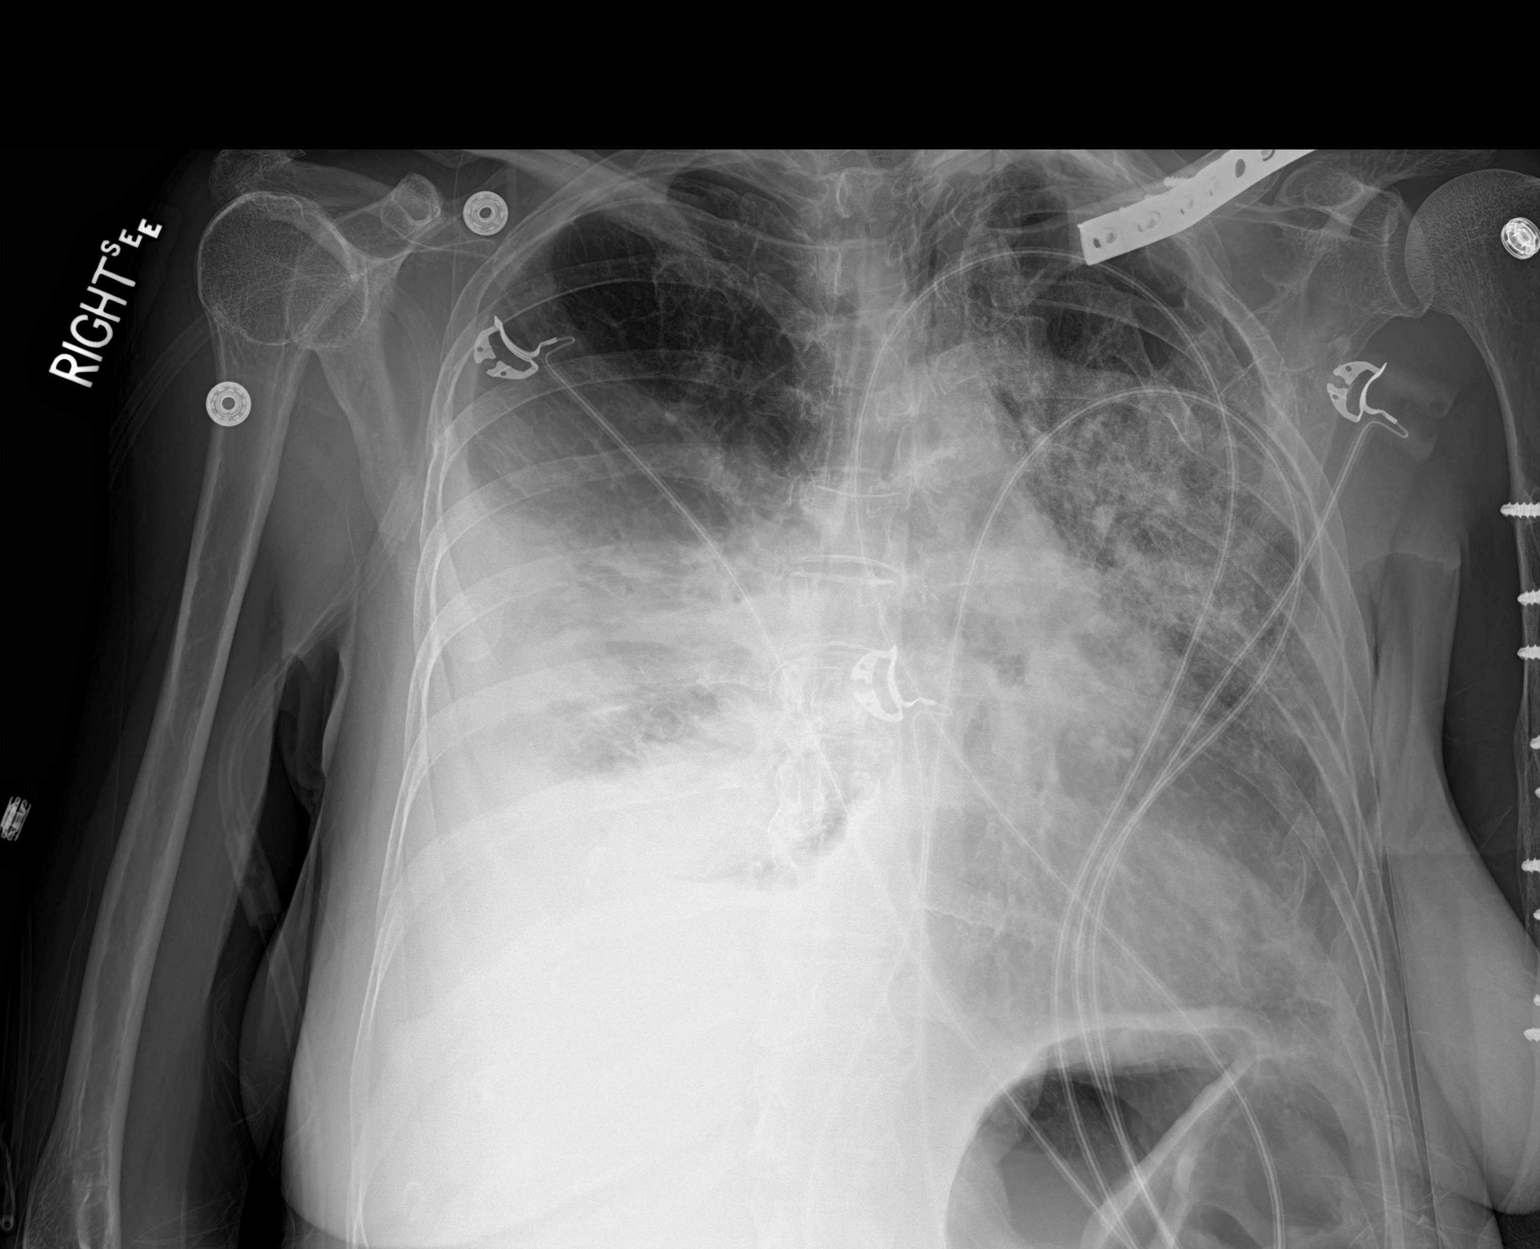

[1 of 1 positions shown; findings below may reference images not displayed]

FINDINGS: Similar opacification of right hemithorax corresponding to large
loculated pleural effusion and basilar airspace disease. Progress of
patchy opacities throughout the left hemithorax. There may be a
small left pleural effusion, new. Left basilar pulmonary nodules are
not well seen by radiograph. The heart size is normal, but obscured
by adjacent pulmonary opacities. Remote traumatic injury to the left
hemithorax.
IMPRESSION: 1. Similar opacification of the right hemithorax corresponding to
large loculated pleural effusion and basilar airspace disease on CT.
2. Progression of patchy opacities throughout the left hemithorax,,
which may be multifocal infection or pulmonary edema. Possible small
left pleural effusion, new.

## 2021-02-01 MED ORDER — MORPHINE SULFATE (PF) 2 MG/ML IV SOLN
1.0000 mg | Freq: Once | INTRAVENOUS | Status: AC
Start: 2021-02-01 — End: 2021-02-01
  Administered 2021-02-01: 1 mg via INTRAVENOUS

## 2021-02-01 MED ORDER — MORPHINE SULFATE (PF) 2 MG/ML IV SOLN
INTRAVENOUS | Status: AC
  Filled 2021-02-01: qty 1

## 2021-02-01 MED ORDER — LORAZEPAM 2 MG/ML IJ SOLN: 0.5000 mg | INTRAMUSCULAR | Status: DC | PRN

## 2021-02-01 MED ORDER — SODIUM CHLORIDE 0.9 % IV SOLN
3.0000 g | Freq: Four times a day (QID) | INTRAVENOUS | Status: DC
  Administered 2021-02-01 – 2021-02-02 (×3): 3 g via INTRAVENOUS
  Filled 2021-02-01 (×9): qty 8

## 2021-02-01 MED ORDER — MORPHINE SULFATE (PF) 2 MG/ML IV SOLN
2.0000 mg | INTRAVENOUS | Status: DC | PRN
Start: 2021-02-01 — End: 2021-02-18
  Administered 2021-02-01 – 2021-02-06 (×2): 2 mg via INTRAVENOUS
  Filled 2021-02-01 (×2): qty 1

## 2021-02-01 MED ORDER — POTASSIUM CHLORIDE 10 MEQ/100ML IV SOLN
10.0000 meq | INTRAVENOUS | Status: AC
Start: 2021-02-01 — End: 2021-02-02
  Administered 2021-02-01 – 2021-02-02 (×4): 10 meq via INTRAVENOUS
  Filled 2021-02-01 (×2): qty 100

## 2021-02-01 MED ORDER — ACETAMINOPHEN 650 MG RE SUPP
650.0000 mg | RECTAL | Status: DC | PRN
  Administered 2021-02-01: 650 mg via RECTAL
  Filled 2021-02-01: qty 1

## 2021-02-01 MED ORDER — FUROSEMIDE 10 MG/ML IJ SOLN: 40.0000 mg | Freq: Once | INTRAMUSCULAR | Status: AC

## 2021-02-01 MED ORDER — CHLORPROMAZINE HCL 25 MG PO TABS
50.0000 mg | ORAL_TABLET | Freq: Two times a day (BID) | ORAL | Status: DC | PRN
  Filled 2021-02-01 (×2): qty 2

## 2021-02-01 MED ORDER — CHLORHEXIDINE GLUCONATE CLOTH 2 % EX PADS
6.0000 | MEDICATED_PAD | Freq: Every day | CUTANEOUS | Status: DC
  Administered 2021-02-01 – 2021-02-17 (×14): 6 via TOPICAL

## 2021-02-01 MED ORDER — FUROSEMIDE 10 MG/ML IJ SOLN
INTRAMUSCULAR | Status: AC
  Administered 2021-02-01: 40 mg via INTRAVENOUS
  Filled 2021-02-01: qty 4

## 2021-02-01 NOTE — Progress Notes (Addendum)
[facility] Progress Note Patient Name: Margaret [name] DOB: 02-Nov-1970 MRN: [id number]   Date of Service  02/01/2021  HPI/Events of Note  Already evaluated by PCCM. Currently on NRB mask but O2 sat is 99 and no distress at this time. Is able to follow basic commands. Febrile.   eICU Interventions  Tylenol PRN, antibiotics, lasix as ordered Chest tube placement depending on her clinical condition Gradually wean Fio2 as tolerated Call E link If needed  D/w RN     Intervention Category Major Interventions: Respiratory failure - evaluation and management  Margaret [name] G Margaret [name] 02/01/2021, 10:45 PM  Addendum at 12:30 am Notified that patient unable to take her ordered oral psych medications due to her respiratory status Very agitated and will try and use a precedex infusion overnight to help calm her while the oral medications are held  Addendum at 3:15 am Notified that patient took her NRB mask off and desaturated to 74% Seen on camera Current RR is 24-25. RN does not the RR goes up to 40 at times. O2 sat is 96 on the NRB and she is a mouth breather At this time she is awake and calm, not in distress, able to follow commands but it is obvious that she also does have some cognitive impairment which complicates effective and quick communication  On precedex at 0.6 mic/min, she is not agitated or restless at this time RN notes she sounds tight, so we will try the nebs which are ordered In addition, CXR ordered I do not think she is in distress at this time and do not think she will tolerate BIPAP either (currently not indicated) S/p Lasix she has put out around 1.3 liter of urine so do not think this is a volume issue at this time Overall has very poor lung reserve  Will reach out to ground team once the X ray is also done - a chest tube is planned this AM anyway   Addendum 3:45 am Increased work of breathing and desaturates with minimal effort. Have asked ground team to assess.

## 2021-02-01 NOTE — Progress Notes (Signed)
PROGRESS NOTE    Margaret [name]  PIR:[id number] DOB: [birth year] DOA: 01/29/2021 PCP: Dettinger, Fransisca Kaufmann, MD    Brief Narrative:  50 year old female with a history of schizophrenia/impulse disorder/intellectual disability who is a resident of a group home and has an appointed guardian, was brought to the hospital with difficulty with ambulation.  Work-up showed that she had a large right-sided pleural effusion with loculations.  She was started on antibiotics.  Case was reviewed with pulmonology who felt she would be best served by transfer to [facility] and would likely need chest tube with thrombolytics for further management.  Dr. Vaughan Browner has kindly accepted the patient onto the PCCM service.   Assessment & Plan:   Principal Problem:   Acute respiratory failure with hypoxia (HCC) Active Problems:   Chronic hepatitis B (HCC)   Schizophrenia (HCC)   Complex ovarian cyst   Multiple pulmonary nodules   Community acquired pneumonia   Pleural effusion   Hypokalemia   Leukocytosis   Centrilobular emphysema (HCC)   Acute pulmonary edema (HCC)   Community-acquired pneumonia -Started on ceftriaxone and azithromycin -Sputum culture/blood cultures pending -Urinary antigens for pneumococcus and Legionella negative -We will change ceftriaxone to Unasyn for anaerobic coverage   Large right-sided pleural effusion -Etiology is not clear -Possibly could be parapneumonic -Discussed with pulmonology who felt it was best for patient transfer to [facility].  She will need chest tube placement and will likely need thrombolytics for loculations. -Case was also discussed with Dr. Kipp Brood, CVTS who agreed with chest tube placement for initial evaluation.  He also agreed with transfer to [facility] in case patient will need further management in the operating room. -She is currently on room air with normal pulse ox, but does have increased respiratory effort. -Currently she is awaiting a  bed at [facility].  Overall condition appears unchanged from yesterday.   Right adnexal mass -Noted to have complex ovarian cyst on the right -There is concern for underlying malignancy -She will need further imaging with transvaginal ultrasound/MRI pelvis once her pulmonary issues have been addressed and she is able to lie flat -She will need referral to oncology based on further imaging -CEA normal -CA125 elevated at 111   Pulmonary nodules -Noted on initial CT abdomen/pelvis from 10/20 -On repeat CT chest done on admission, nodules appear to be smaller in size indicating that he may be infectious/inflammatory   Hypokalemia -Replace   Schizophrenia/baseline cognitive impairment -Continue outpatient psychotropics   Hepatitis B -Continue antiviral therapy   Social -Appears that she resides at a group home -Her guardian is through [facility] [employer] -Due to patient's cognitive deficits, her guardian plans on discussing pelvic findings with the patient once further information is available, since plan of care has to be discussed with the patient in a way that she can comprehend   DVT prophylaxis: heparin injection 5,000 Units Start: 01/30/21 1400 SCDs Start: 01/30/21 1003  Code Status: Full code Family Communication: Patient's guardian Marzetta Board) at [employer] [phone], was updated on the plan for patient to move to [facility].  She is agreeable for transfer and also consented for chest tube placement.  She wishes to be contacted once patient arrives to [facility] Disposition Plan: Status is: Inpatient  Remains inpatient appropriate because: Transfer to [facility] for further management of pleural effusion.  Will likely need chest tube.         Consultants:  Pulmonology  Procedures:    Antimicrobials:  Ceftriaxone 10/29>  10/31 Azithromycin 10/29 > Unasyn 10/31 >   Subjective: Patient seen laying in bed.  Her overall condition  appears to be unchanged from yesterday.  She is still on room air.  Has mild increased respiratory effort.  Admits to having a cough.  Able to carry on a conversation  Objective: Vitals:   02/01/21 2006 02/01/21 2100 02/01/21 2155 02/01/21 2200  BP: 116/62  117/71   Pulse: (!) 123 (!) 127 (!) 130 (!) 131  Resp: (!) 28 (!) 33 (!) 36 (!) 24  Temp: 98.9 F (37.2 C)     TempSrc: Oral     SpO2: 92% 93% 92% 92%  Weight:      Height:        Intake/Output Summary (Last 24 hours) at 02/01/2021 2239 Last data filed at 02/01/2021 1600 Gross per 24 hour  Intake 1060 ml  Output --  Net 1060 ml   Filed Weights   01/29/21 2307 02/01/21 1715  Weight: 48.5 kg 52.5 kg    Examination:  General exam: Alert, awake, oriented x 3 Respiratory system: Diminished breath sounds right side, some crackles at left base.  Mild increased respiratory effort. Cardiovascular system: Mild tachycardia. No murmurs, rubs, gallops. Gastrointestinal system: Abdomen is nondistended, soft and nontender. No organomegaly or masses felt. Normal bowel sounds heard. Central nervous system: No focal neurological deficits. Extremities: No C/C/E, +pedal pulses Skin: No rashes, lesions or ulcers Psychiatry: Speech is tangential, appears mildly anxious     Data Reviewed: I have personally reviewed following labs and imaging studies  CBC: Recent Labs  Lab 01/30/21 0016 01/31/21 0457 02/01/21 0524 02/01/21 2146  WBC 31.9* 38.9* 34.1* 38.4*  NEUTROABS  --  34.6*  --   --   HGB 9.3* 9.0* 7.8* 8.6*  HCT 29.5* 28.1* 23.7* 26.9*  MCV 87.5 87.5 87.1 85.1  PLT 356 372 329 193   Basic Metabolic Panel: Recent Labs  Lab 01/30/21 0016 01/31/21 0457 02/01/21 0524  NA 133* 137 132*  K 3.2* 3.5 3.0*  CL 98 104 100  CO2 28 25 24   GLUCOSE 137* 100* 189*  BUN 9 7 7   CREATININE 0.61 0.49 0.57  CALCIUM 8.3* 7.8* 7.4*  MG  --  1.9  --    GFR: Estimated Creatinine Clearance: 71.3 mL/min (by C-G formula based on SCr  of 0.57 mg/dL). Liver Function Tests: Recent Labs  Lab 01/30/21 0016 01/31/21 0457 02/01/21 0524  AST 19 21 21   ALT 22 22 20   ALKPHOS 87 88 78  BILITOT 0.3 0.4 0.4  PROT 5.6* 5.0* 4.6*  ALBUMIN 2.1* 1.8* 1.5*   Recent Labs  Lab 01/30/21 0016  LIPASE 26   No results for input(s): AMMONIA in the last 168 hours. Coagulation Profile: No results for input(s): INR, PROTIME in the last 168 hours. Cardiac Enzymes: No results for input(s): CKTOTAL, CKMB, CKMBINDEX, TROPONINI in the last 168 hours. BNP (last 3 results) No results for input(s): PROBNP in the last 8760 hours. HbA1C: No results for input(s): HGBA1C in the last 72 hours. CBG: No results for input(s): GLUCAP in the last 168 hours. Lipid Profile: No results for input(s): CHOL, HDL, LDLCALC, TRIG, CHOLHDL, LDLDIRECT in the last 72 hours. Thyroid Function Tests: No results for input(s): TSH, T4TOTAL, FREET4, T3FREE, THYROIDAB in the last 72 hours. Anemia Panel: No results for input(s): VITAMINB12, FOLATE, FERRITIN, TIBC, IRON, RETICCTPCT in the last 72 hours. Sepsis Labs: Recent Labs  Lab 01/30/21 0325 01/30/21 0557  LATICACIDVEN 0.7 0.9  Recent Results (from the past 240 hour(s))  Resp Panel by RT-PCR (Flu A&B, Covid) Nasopharyngeal Swab     Status: None   Collection Time: 01/30/21  3:14 AM   Specimen: Nasopharyngeal Swab; Nasopharyngeal(NP) swabs in vial transport medium  Result Value Ref Range Status   SARS Coronavirus 2 by RT PCR NEGATIVE NEGATIVE Final    Comment: (NOTE) SARS-CoV-2 target nucleic acids are NOT DETECTED.  The SARS-CoV-2 RNA is generally detectable in upper respiratory specimens during the acute phase of infection. The lowest concentration of SARS-CoV-2 viral copies this assay can detect is 138 copies/mL. A negative result does not preclude SARS-Cov-2 infection and should not be used as the sole basis for treatment or other patient management decisions. A negative result may occur with   improper specimen collection/handling, submission of specimen other than nasopharyngeal swab, presence of viral mutation(s) within the areas targeted by this assay, and inadequate number of viral copies(<138 copies/mL). A negative result must be combined with clinical observations, patient history, and epidemiological information. The expected result is Negative.  Fact Sheet for Patients:  EntrepreneurPulse.com.au  Fact Sheet for Healthcare Providers:  IncredibleEmployment.be  This test is no t yet approved or cleared by the [place] FDA and  has been authorized for detection and/or diagnosis of SARS-CoV-2 by FDA under an Emergency Use Authorization (EUA). This EUA will remain  in effect (meaning this test can be used) for the duration of the COVID-19 declaration under Section 564(b)(1) of the Act, 21 U.S.C.section 360bbb-3(b)(1), unless the authorization is terminated  or revoked sooner.       Influenza A by PCR NEGATIVE NEGATIVE Final   Influenza B by PCR NEGATIVE NEGATIVE Final    Comment: (NOTE) The Xpert Xpress SARS-CoV-2/FLU/RSV plus assay is intended as an aid in the diagnosis of influenza from Nasopharyngeal swab specimens and should not be used as a sole basis for treatment. Nasal washings and aspirates are unacceptable for Xpert Xpress SARS-CoV-2/FLU/RSV testing.  Fact Sheet for Patients: EntrepreneurPulse.com.au  Fact Sheet for Healthcare Providers: IncredibleEmployment.be  This test is not yet approved or cleared by the [place] FDA and has been authorized for detection and/or diagnosis of SARS-CoV-2 by FDA under an Emergency Use Authorization (EUA). This EUA will remain in effect (meaning this test can be used) for the duration of the COVID-19 declaration under Section 564(b)(1) of the Act, 21 U.S.C. section 360bbb-3(b)(1), unless the authorization is terminated  or revoked.  Performed at [facility], [address] [place], [place] [address]   Culture, blood (routine x 2)     Status: None (Preliminary result)   Collection Time: 01/30/21  6:58 AM   Specimen: BLOOD  Result Value Ref Range Status   Specimen Description BLOOD  Final   Special Requests NONE  Final   Culture   Final    NO GROWTH 2 DAYS Performed at [facility], [address], [place], [place] [address]    Report Status PENDING  Incomplete  Culture, blood (routine x 2)     Status: None (Preliminary result)   Collection Time: 01/30/21  7:13 AM   Specimen: BLOOD  Result Value Ref Range Status   Specimen Description BLOOD  Final   Special Requests NONE  Final   Culture   Final    NO GROWTH 2 DAYS Performed at [facility], [address] [place], [place] [address]    Report Status PENDING  Incomplete         Radiology Studies: DG CHEST  PORT 1 VIEW  Result Date: 02/01/2021 CLINICAL DATA:  Dyspnea. EXAM: PORTABLE CHEST 1 VIEW COMPARISON:  CT 01/30/2021 FINDINGS: Similar opacification of right hemithorax corresponding to large loculated pleural effusion and basilar airspace disease. Progress of patchy opacities throughout the left hemithorax. There may be a small left pleural effusion, new. Left basilar pulmonary nodules are not well seen by radiograph. The heart size is normal, but obscured by adjacent pulmonary opacities. Remote traumatic injury to the left hemithorax. IMPRESSION: 1. Similar opacification of the right hemithorax corresponding to large loculated pleural effusion and basilar airspace disease on CT. 2. Progression of patchy opacities throughout the left hemithorax,, which may be multifocal infection or pulmonary edema. Possible small left pleural effusion, new. Electronically Signed   By: Keith Rake M.D.   On: 02/01/2021 21:23        Scheduled Meds:  acetaminophen  1,000 mg Oral Once   benztropine  1 mg Oral BID   cloZAPine  250 mg Oral QHS    DULoxetine  60 mg Oral Daily   entecavir  0.5 mg Oral Daily   feeding supplement  237 mL Oral BID BM   heparin  5,000 Units Subcutaneous Q8H   mirtazapine  7.5 mg Oral QHS   morphine       morphine       Continuous Infusions:  ampicillin-sulbactam (UNASYN) IV 3 g (02/01/21 2130)   azithromycin 500 mg (02/01/21 0450)   potassium chloride 10 mEq (02/01/21 2212)     LOS: 2 days    Time spent: 25mns    JKathie Dike MD [facility]   If 7PM-7AM, please contact night-coverage [email or web address]  02/01/2021, 10:39 PM

## 2021-02-01 NOTE — Progress Notes (Signed)
[facility] hospitalist night coverage note S: 50 yo F with history of developmental delay, has guardianship and lives in group home.  Also HBV carrier status.  Pt admitted to [facility] on 10/29 with CAP and large R loculated pleural effusion.  Concern for neoplastic process (possibly metastatic ovarian CA), elevated CA19-9.  Pt started on rocephin and azithro.  BCx only came back 1 bottle positive for S.Epi (probably contaminant).  WBC 38k.  COVID and flu neg.  Plan was to transfer pt to [facility] for CVTS consult and CT placement on 10/30.  Unfortunately due to lack of beds, pt didn't arrive to [facility] until earlier this evening 10/31.  On arrival pt with significant SOB and tachypnea and hypoxia.  This significantly worse than yesterday: yesterday was tachypnic but satting okay on RA.  Now satting 90% on 15L with frank respiratory distress.  RR 30-40.  I saw and evaluated pt at bedside.  Pt does indicate she has headache and CP.  O: Vitals with BMI 02/01/2021 02/01/2021 02/01/2021  Height - - -  Weight - - -  BMI - - -  Systolic - 213 086  Diastolic - 62 83  Pulse 578 123 122    General:  In respiratory distress Eyes: PEERLA EOMI ENT: mucous membranes moist Neck: supple w/o JVD Cardiovascular: Tachycardic Respiratory: Diminished over most of the lower R chest.  Crackles in upper R chest, and throughout left chest. Abdomen: soft, nt, nd, bs+ Skin: no rash nor lesion Musculoskeletal: MAE, full ROM all 4 extremities Psychiatric: Delayed tone and affect Neurologic: AAOx3, grossly non-focal  EKG - S.Tach, no STEMI CXR -  IMPRESSION: 1. Similar opacification of the right hemithorax corresponding to large loculated pleural effusion and basilar airspace disease on CT. 2. Progression of patchy opacities throughout the left hemithorax,, which may be multifocal infection or pulmonary edema. Possible small left pleural effusion, new.  ABG - Mild respiratory alkalosis, hypoxia.  No hypercapnic component.  A: Pt  in respiratory distress, possibly due to pulmonary edema vs worsening PNA.  Also large loculated R pleural effusion that's taking up ~half the chest volume or more isnt helping matters.  P: 1) work up ordered and pending stil: CBC, CMP, trop, BNP, procalcitonin, RVP, lactate, MRSA PCR 2) pt given 48m IV lasix empirically 3) PCCM consulted, Dr. JCarson Myrtleto bedside.  Current plan is to transfer pt to ICU.  Possibly looking at draining pleural effusion tonight.  CRITICAL CARE Performed by: GEtta Quill   Total critical care time: 70 minutes  Critical care time was exclusive of separately billable procedures and treating other patients.  Critical care was necessary to treat or prevent imminent or life-threatening deterioration.  Critical care was time spent personally by me on the following activities: development of treatment plan with patient and/or surrogate as well as nursing, discussions with consultants, evaluation of patient's response to treatment, examination of patient, obtaining history from patient or surrogate, ordering and performing treatments and interventions, ordering and review of laboratory studies, ordering and review of radiographic studies, pulse oximetry and re-evaluation of patient's condition.

## 2021-02-01 NOTE — Progress Notes (Signed)
Report called to 6E-progressive unit and gave report to [facility]. No further questions at this time. Care link here to get pt.

## 2021-02-01 NOTE — Progress Notes (Signed)
Report given to Cassie at [facility].

## 2021-02-01 NOTE — Progress Notes (Signed)
RN called [employer] crisis line at [phone] which is the number that was provided for patient's legal guardian. Spoke with Cassandra and updated her that patient had been transferred to [facility]. Provided Cassandra with patient's room number and with the phone number to the nurse's station on [place].

## 2021-02-01 NOTE — Progress Notes (Shared)
[facility] [address], [place] [address]   CLINIC:  Medical Oncology/Hematology  CONSULT NOTE  Patient Care Team: Dettinger, Fransisca Kaufmann, MD as PCP - General (Family Medicine) Rogene Houston, MD as Consulting Physician (Gastroenterology)  CHIEF COMPLAINTS/PURPOSE OF CONSULTATION:  ***  HISTORY OF PRESENTING ILLNESS:  [name] 50 y.o. female is here because of ***, at the request of {REFERRING PHYSICIAN}.  MEDICAL HISTORY:  Past Medical History:  Diagnosis Date   Hematuria 11/12/2014   Hepatitis B carrier (HCC)    Hypothyroidism    Impulse disorder    IUD (intrauterine device) in place 11/12/2014   Inserted 12/24/12 at [facility]   Mild intellectual disability    Schizophrenia ([place])    Schizophrenia ([place])    Tuberculosis    as a child   Urinary frequency 11/12/2014    SURGICAL HISTORY: Past Surgical History:  Procedure Laterality Date   ARM WOUND REPAIR / CLOSURE     TRACHEAL SURGERY     TRACHEOSTOMY      SOCIAL HISTORY: Social History   Socioeconomic History   Marital status: Single    Spouse name: Not on file   Number of children: 0   Years of education: 12   Highest education level: High school graduate  Occupational History   Occupation: disabled    Comment: due to schizophrenia  Tobacco Use   Smoking status: Never   Smokeless tobacco: Never  Vaping Use   Vaping Use: Never used  Substance and Sexual Activity   Alcohol use: No    Alcohol/week: 0.0 standard drinks   Drug use: No   Sexual activity: Not Currently    Birth control/protection: I.U.D.  Other Topics Concern   Not on file  Social History Narrative   Patient was adopted. She does not have any children and has never been married. She lives in a group home and is disabled. Has a diagnosis of schizophrenia. She socially isolates herself, doesn't like to participate in group activities, sleeps a lot, has to be prompted to bathe, eat, dress, etc. -07/22/20   Social  Determinants of Health   Financial Resource Strain: Low Risk    Difficulty of Paying Living Expenses: Not hard at all  Food Insecurity: No Food Insecurity   Worried About Charity fundraiser in the Last Year: Never true   [place] in the Last Year: Never true  Transportation Needs: No Transportation Needs   Lack of Transportation (Medical): No   Lack of Transportation (Non-Medical): No  Physical Activity: Insufficiently Active   Days of Exercise per Week: 1 day   Minutes of Exercise per Session: 20 min  Stress: Stress Concern Present   Feeling of Stress : To some extent  Social Connections: Socially Isolated   Frequency of Communication with Friends and Family: More than three times a week   Frequency of Social Gatherings with Friends and Family: More than three times a week   Attends Religious Services: Never   Marine scientist or Organizations: No   Attends Music therapist: Never   Marital Status: Never married  Human resources officer Violence: Not At Risk   Fear of Current or Ex-Partner: No   Emotionally Abused: No   Physically Abused: No   Sexually Abused: No    FAMILY HISTORY: Family History  Adopted: Yes  Family history unknown: Yes    ALLERGIES:  has No Known Allergies.  MEDICATIONS:  No current facility-administered  medications for this visit.   No current outpatient medications on file.   Facility-Administered Medications Ordered in Other Visits  Medication Dose Route Frequency Provider Last Rate Last Admin   acetaminophen (TYLENOL) tablet 1,000 mg  1,000 mg Oral Once Zierle-Ghosh, Asia B, DO       acetaminophen (TYLENOL) tablet 650 mg  650 mg Oral Q6H PRN Kathie Dike, MD   650 mg at 02/01/21 0414   albuterol (VENTOLIN HFA) 108 (90 Base) MCG/ACT inhaler 2 puff  2 puff Inhalation Q4H PRN Zierle-Ghosh, Asia B, DO       Ampicillin-Sulbactam (UNASYN) 3 g in sodium chloride 0.9 % 100 mL IVPB  3 g Intravenous Q6H Memon, Jolaine Artist, MD 200  mL/hr at 02/01/21 1140 3 g at 02/01/21 1140   azithromycin (ZITHROMAX) 500 mg in sodium chloride 0.9 % 250 mL IVPB  500 mg Intravenous Q24H Kathie Dike, MD 250 mL/hr at 02/01/21 0450 500 mg at 02/01/21 0450   benztropine (COGENTIN) tablet 1 mg  1 mg Oral BID Zierle-Ghosh, Asia B, DO   1 mg at 02/01/21 7989   chlorproMAZINE (THORAZINE) tablet 50 mg  50 mg Oral Q12H PRN Kathie Dike, MD       cloZAPine (CLOZARIL) tablet 250 mg  250 mg Oral QHS Zierle-Ghosh, Asia B, DO   250 mg at 01/31/21 2319   DULoxetine (CYMBALTA) DR capsule 60 mg  60 mg Oral Daily Zierle-Ghosh, Asia B, DO   60 mg at 02/01/21 0819   entecavir (BARACLUDE) tablet 0.5 mg  0.5 mg Oral Daily Zierle-Ghosh, Asia B, DO       feeding supplement (ENSURE ENLIVE / ENSURE PLUS) liquid 237 mL  237 mL Oral BID BM Kathie Dike, MD   237 mL at 02/01/21 1517   heparin injection 5,000 Units  5,000 Units Subcutaneous Q8H Zierle-Ghosh, Asia B, DO   5,000 Units at 01/31/21 2228   hydrOXYzine (ATARAX/VISTARIL) tablet 25 mg  25 mg Oral TID PRN Kathie Dike, MD       mirtazapine (REMERON) tablet 7.5 mg  7.5 mg Oral QHS Zierle-Ghosh, Asia B, DO   7.5 mg at 01/31/21 2229    REVIEW OF SYSTEMS:   Review of Systems - Oncology   PHYSICAL EXAMINATION: ECOG PERFORMANCE STATUS: {CHL ONC ECOG PS:[phone]}  There were no vitals filed for this visit. There were no vitals filed for this visit. Physical Exam   LABORATORY DATA:  I have reviewed the data as listed CBC Latest Ref Rng & Units 02/01/2021 01/31/2021 01/30/2021  WBC 4.0 - 10.5 K/uL 34.1(H) 38.9(H) 31.9(H)  Hemoglobin 12.0 - 15.0 g/dL 7.8(L) 9.0(L) 9.3(L)  Hematocrit 36.0 - 46.0 % 23.7(L) 28.1(L) 29.5(L)  Platelets 150 - 400 K/uL 329 372 356   CMP Latest Ref Rng & Units 02/01/2021 01/31/2021 01/30/2021  Glucose 70 - 99 mg/dL 189(H) 100(H) 137(H)  BUN 6 - 20 mg/dL 7 7 9   Creatinine 0.44 - 1.00 mg/dL 0.57 0.49 0.61  Sodium 135 - 145 mmol/L 132(L) 137 133(L)  Potassium 3.5 - 5.1  mmol/L 3.0(L) 3.5 3.2(L)  Chloride 98 - 111 mmol/L 100 104 98  CO2 22 - 32 mmol/L 24 25 28   Calcium 8.9 - 10.3 mg/dL 7.4(L) 7.8(L) 8.3(L)  Total Protein 6.5 - 8.1 g/dL 4.6(L) 5.0(L) 5.6(L)  Total Bilirubin 0.3 - 1.2 mg/dL 0.4 0.4 0.3  Alkaline Phos 38 - 126 U/L 78 88 87  AST 15 - 41 U/L 21 21 19   ALT 0 - 44 U/L 20 22 22  RADIOGRAPHIC STUDIES: I have personally reviewed the radiological images as listed and agreed with the findings in the report. CT Chest W Contrast  Result Date: 01/30/2021 CLINICAL DATA:  Multiple pulmonary nodules suspicious for pulmonary metastatic disease. EXAM: CT CHEST WITH CONTRAST TECHNIQUE: Multidetector CT imaging of the chest was performed during intravenous contrast administration. CONTRAST:  9m OMNIPAQUE IOHEXOL 350 MG/ML SOLN COMPARISON:  CT abdomen pelvis 01/20/2021 FINDINGS: Cardiovascular: Extensive multi-vessel coronary artery calcification. Global cardiac size within normal limits. Small pericardial effusion is present, enlarged since prior examination. The central pulmonary arteries are of normal caliber. Mild atherosclerotic calcification within the thoracic aorta. No aortic aneurysm. Mediastinum/Nodes: Large right pleural effusion results in new, mild mediastinal shift to the left. No pathologic thoracic adenopathy. The esophagus is unremarkable. Visualized thyroid is unremarkable. Lungs/Pleura: Since the prior examination, a large, partially loculated right pleural effusion has developed with near complete collapse of the right middle and lower lobes and compressive atelectasis of the right upper lobe. There is superimposed patchy multifocal pulmonary ground-glass infiltrate and peripheral consolidation within the aerated lungs which appears progressive since prior examination within the visualized lung bases. Previously noted pulmonary nodules appear to have decreased in the interval, with the index lesion within the left lung base previously measuring 22 x  25 mm now measuring 14 mm x 20 mm. This suggests an evolving infectious or inflammatory process. Mild superimposed centrilobular emphysema. No pneumothorax. No central obstructing mass., Upper Abdomen: Moderate stool within the visualized splenic flexure. No acute abnormality. Musculoskeletal: No acute bone abnormality. No lytic or blastic bone lesions. Multiple healed bilateral rib fractures, bilateral clavicle fractures, and left scapular fracture are noted. IMPRESSION: Interval development of a large partially loculated right pleural effusion with near complete collapse of the right lung base in atelectasis of the right upper lobe and resultant mild mediastinal shift to the left. Evolving infectious or inflammatory process within the aerated lungs with interval decrease in size of focal pulmonary nodules noted on prior examination, but progressive peripheral consolidation and ground-glass pulmonary infiltrate demonstrating a basilar predominance. Differential considerations are led by acute infection though pulmonary vasculitides or drug reaction could appear similarly. Diagnostic and therapeutic thoracentesis of the large right pleural effusion may be helpful for further management. Extensive multi-vessel coronary artery calcification. Mild emphysema Aortic Atherosclerosis (ICD10-I70.0) and Emphysema (ICD10-J43.9). Electronically Signed   By: AFidela SalisburyM.D.   On: 01/30/2021 04:16   CT Abdomen Pelvis W Contrast  Result Date: 01/21/2021 CLINICAL DATA:  Right lower quadrant mass. Chronic hepatitis B. EXAM: CT ABDOMEN AND PELVIS WITH CONTRAST TECHNIQUE: Multidetector CT imaging of the abdomen and pelvis was performed using the standard protocol following bolus administration of intravenous contrast. CONTRAST:  1027mOMNIPAQUE IOHEXOL 300 MG/ML  SOLN COMPARISON:  None. FINDINGS: Lower Chest: Multiple pulmonary nodules are seen in both lung bases, measuring up to 3 cm. These are highly suspicious for  pulmonary metastases. Tiny right pleural effusion also noted. Hepatobiliary: No hepatic masses identified. Increased attenuation in the gallbladder may be due to sludge or gallstones. No evidence of cholecystitis or biliary ductal dilatation. Pancreas:  No mass or inflammatory changes. Spleen: Within normal limits in size and appearance. Adrenals/Urinary Tract: No masses identified. No evidence of ureteral calculi or hydronephrosis. Stomach/Bowel: Large stool burden is noted throughout the colon. No masses identified. No evidence of obstruction, inflammatory process or abnormal fluid collections. Vascular/Lymphatic: No pathologically enlarged lymph nodes. No acute vascular findings. Reproductive: IUD noted. A cystic lesion is noted in the right adnexal  region which measures 3.1 x 1.9 cm on image 79/2. This appears to show some internal areas of contrast enhancement, and cystic ovarian neoplasm cannot be excluded. No evidence of ascites. Other:  None. Musculoskeletal:  No suspicious bone lesions identified. IMPRESSION: Multiple pulmonary nodules in both lung bases, highly suspicious for pulmonary metastases. Consider chest CT with contrast or PET-CT for further evaluation. 3.1 cm indeterminate cystic lesion in right adnexal region. Cystic ovarian neoplasm cannot be excluded. Pelvic MRI without and with contrast is recommended for further characterization. No evidence of abdominal or pelvic metastatic disease. Marked diffuse colonic distension by stool. Gallbladder sludge or gallstones. No radiographic evidence of cholecystitis or biliary ductal dilatation. These results will be called to the ordering clinician or representative by the Radiologist Assistant, and communication documented in the PACS or [employer]. Electronically Signed   By: Marlaine Hind M.D.   On: 01/21/2021 16:04    ASSESSMENT:  ***   PLAN:  ***   All questions were answered. The patient knows to call the clinic with any problems,  questions or concerns.   Derek Jack, MD, 02/01/21 6:49 PM  [place] [phone]   I, ***, am acting as a scribe for Dr. Derek Jack.  {Add Barista Statement}

## 2021-02-01 NOTE — Progress Notes (Signed)
Initial Nutrition Assessment  DOCUMENTATION CODES:   Underweight  INTERVENTION:  Ensure Enlive po BID, each supplement provides 350 kcal and 20 grams of protein   [place] yogurt with breakfast   Ice cream with lunch and dinner  NUTRITION DIAGNOSIS:   Inadequate oral intake related to poor appetite, social / environmental circumstances (patient lives in a group home) as evidenced by per patient/family report, percent weight loss (BMI 18.37 (underweight)).   GOAL:  Patient will meet greater than or equal to 90% of their needs   MONITOR:  PO intake, Supplement acceptance, Weight trends, Labs  REASON FOR ASSESSMENT:   Malnutrition Screening Tool    ASSESSMENT: Patient is an underweight 50 yo female with hx of schizophrenia who presents with ambulation difficulty. Community acquired pneumonia, large right sided PE (pending diagnostic  thoracentesis), right adnexal mass concerning for malignancy per MD.   Patient takes Remeron for appetite. Variable intake since admission 0-100% of meals consumed. Able to feed herself. Patient eating pattern 3 meals daily at group home.    Patient Weight 48.5 kg and 49.2 kg on 01/05/21 and 49.5 kg on 11/30/20. Her overall weight is down 10 kg (17%) compared to last October when she weighed 58.1 kg. Concerning unplanned weight loss but doesn't trigger as significant.   Medications reviewed: Remeron   Labs: BMP Latest Ref Rng & Units 02/01/2021 01/31/2021 01/30/2021  Glucose 70 - 99 mg/dL 189(H) 100(H) 137(H)  BUN 6 - 20 mg/dL 7 7 9   Creatinine 0.44 - 1.00 mg/dL 0.57 0.49 0.61  BUN/Creat Ratio 9 - 23 - - -  Sodium 135 - 145 mmol/L 132(L) 137 133(L)  Potassium 3.5 - 5.1 mmol/L 3.0(L) 3.5 3.2(L)  Chloride 98 - 111 mmol/L 100 104 98  CO2 22 - 32 mmol/L 24 25 28   Calcium 8.9 - 10.3 mg/dL 7.4(L) 7.8(L) 8.3(L)      NUTRITION - FOCUSED PHYSICAL EXAM: Unable to complete Nutrition-Focused physical exam at this time. Plan to complete on follow up visit.     Diet Order:   Diet Order             Diet Heart Room service appropriate? Yes; Fluid consistency: Thin  Diet effective now                   EDUCATION NEEDS:  Education needs have been addressed  Skin:  Skin Assessment: Reviewed RN Assessment  Last BM:  10/30 type 3  Height:   Ht Readings from Last 1 Encounters:  01/29/21 5' 4"  (1.626 m)    Weight:   Wt Readings from Last 1 Encounters:  01/29/21 48.5 kg    Ideal Body Weight:   55 kg  BMI:  Body mass index is 18.37 kg/m.  Estimated Nutritional Needs:   Kcal:  1715-1850  Protein:  69-78 gr  Fluid:  >1500 ml daily  Colman Cater MS,RD,CSG,LDN Contact: Shea Evans

## 2021-02-01 NOTE — Progress Notes (Signed)
Pt MEWS turned red. Pt is tachycardic HR in 123-128 sustaining, tachypneic RR in 30-40 bpm and O2 sat in 88 on 6L . Pt was placed on non rebreather mask at 15 L with O2 sat in 93.  Dr. Alcario Drought, critical team , rapid response team and charge nurse was notified.   02/01/21 2006  Vitals  Temp 98.9 F (37.2 C)  Temp Source Oral  BP 116/62  MAP (mmHg) 77  BP Location Left Arm  BP Method Automatic  Patient Position (if appropriate) Lying  Pulse Rate (!) 123  Pulse Rate Source Monitor  ECG Heart Rate (!) 123  Resp (!) 28  MEWS COLOR  MEWS Score Color Red  Oxygen Therapy  SpO2 92 %  O2 Device Nasal Cannula  O2 Flow Rate (L/min) 6 L/min  MEWS Score  MEWS Temp 0  MEWS Systolic 0  MEWS Pulse 2  MEWS RR 2  MEWS LOC 0  MEWS Score 4

## 2021-02-01 NOTE — Significant Event (Addendum)
Rapid Response Event Note   Reason for Call : Acute hypoxic respiratory distress Initial Focused Assessment:  Notified by bedside staff of Ms. Knust in respiratory distress. Upon arrival, she is alert, very anxious, oriented x4 and endorsing SOB. Using accessory abdominal muscles with retractions. Originally on 6L [place] with sats 82%. Pt placed on NRB mask at 15L. ABG drawn on 15L. PCXR done. Dr. Fabio Neighbors at bedside. Montey Hora PA notified of events and orders received. PCCM came to bedside.  Ms. Brabender WOB is improved since being placed on NRB mask. Lasix ordered and given. Pt may need chest tube on the right side soon. CVTS was previously consulted.   2200-98.70F, HR 131 ST, 117/71(83), RR 30-36 with Sats 92% on 15L NRB mask. Pt has voided x1 since lasix administered.   Order received for transfer to ICU.   Interventions:  -Stat PCXR -Stat ABG -Lasix 32m IV now -Transfer to ICU    MD Notified: Dr. GAlcario Droughton dept at time of call Call Time: 2100 Arrival Time: 2100 End Time: 2241  RMadelynn Done RN

## 2021-02-01 NOTE — Progress Notes (Signed)
   02/01/21 1715  Assess: MEWS Score  Temp 98.8 F (37.1 C)  BP 118/62  Pulse Rate (!) 124  ECG Heart Rate (!) 123  Resp 20  Level of Consciousness Alert  O2 Device Nasal Cannula  O2 Flow Rate (L/min) 4 L/min  Assess: MEWS Score  MEWS Temp 0  MEWS Systolic 0  MEWS Pulse 2  MEWS RR 0  MEWS LOC 0  MEWS Score 2  MEWS Score Color Yellow  Assess: if the MEWS score is Yellow or Red  Were vital signs taken at a resting state? Yes  Focused Assessment No change from prior assessment  Early Detection of Sepsis Score *See Row Information* Low  MEWS guidelines implemented *See Row Information* Yes  Treat  MEWS Interventions Escalated (See documentation below)  Pain Scale 0-10  Pain Score 0  Take Vital Signs  Increase Vital Sign Frequency  Yellow: Q 2hr X 2 then Q 4hr X 2, if remains yellow, continue Q 4hrs  Escalate  MEWS: Escalate Yellow: discuss with charge nurse/RN and consider discussing with provider and RRT  Notify: Charge Nurse/RN  Name of Charge Nurse/RN Notified Carroll Kinds, RN  Date Charge Nurse/RN Notified 02/01/21  Time Charge Nurse/RN Notified 1722  Document  Patient Outcome Other (Comment) (patient arrived from [facility] - provider aware of oxygen needs)  Progress note created (see row info) Yes   Provider notified of patient arrival.

## 2021-02-01 NOTE — Significant Event (Signed)
OVERNIGHT COVERAGE CRITICAL CARE PROGRESS NOTE  CTSP re: increased work of breathing and hypoxia.  The patient transferred from [facility] earlier today for chest tube placement for a suspected complicated right pleural effusion.  She has a new diagnosis of ovarian cancer.  On imaging, she has a large right sided effusion that is at least partially loculated and associated with compressive atelectasis of the right lower lobe.  She is tachypneic and endorses pleuritic pain on the right side.  Case discussed with Dr. Alcario Drought, who reports that she has progressed from room air to 15 LPM.  She was given furosemide just prior to our arrival to assess.  At the time of clinical interview, the patient is awake, follows commands.  She was reportedly transferred from [facility] to [facility] on room air, but she is now on a NRB mask with SpO2 88-96%.  She does appear to do better with her left side down.  Bilateral rales anteriorly but clear posteriorly.  She is tachycardic with regular S1 and S2.  Trace edema.  Incontinent of urine.  ABG    Component Value Date/Time   PHART 7.475 (H) 02/01/2021 2105   PCO2ART 34.4 02/01/2021 2105   PO2ART 56.5 (L) 02/01/2021 2105   HCO3 25.0 02/01/2021 2105   O2SAT 89.2 02/01/2021 2105    Principal Problem:   Acute respiratory failure with hypoxia (HCC) Active Problems:   Community acquired pneumonia   Pleural effusion   Acute pulmonary edema (HCC)   Complex ovarian cyst   Multiple pulmonary nodules   Hypokalemia   Centrilobular emphysema (HCC)   Chronic hepatitis B (HCC)   Schizophrenia (HCC)   Leukocytosis  Will transfer to [place].  Continued clinical decline may warrant placement of chest tube sooner.  Will need consent from guardian (history of cognitive impairment).  Place Foley catheter Replete K Chest CT with contrast (10/29) reviewed. Morphine 2 mg IV q 4 hr PRN for pain and respiratory distress but hold for any decrease in mental status. Titrate supplemental  oxygen to maintain SpO2 90-93% Anticipating chest tube placement in am. Continue Unasyn/Zithromax.  Critical care time: 30 minutes.  The treatment and management of the patient's condition was required based on the threat of imminent deterioration. This time reflects time spent by the physician evaluating, providing care and managing the critically ill patient's care. The time was spent at the immediate bedside (or on the same floor/unit and dedicated to this patient's care). Time involved in separately billable procedures is NOT included int he critical care time indicated above. Family meeting and update time may be included above if and only if the patient is unable/incompetent to participate in clinical interview and/or decision making, and the discussion was necessary to determining treatment decisions.  Renee Pain, MD Board Certified by the ABIM, [place]

## 2021-02-01 NOTE — Progress Notes (Signed)
This RN called Vito Backers (legal guardian) to update about pt being transferred to ICU bed 2H04 for closely monitoring.

## 2021-02-01 NOTE — Progress Notes (Signed)
PCCM Interval Progress Note  Called to see pt for respiratory distress. Seen earlier today with plans for right pigtail chest tube in AM for large loculated effusion.  Tachypneic to high 30s with sats in 80s on 6L [place].  Transitioned to NRB and sats improved to low 90s with RR ranging from 20-30. CXR with known large R loculated effusion and bilateral pulmonary edema.  On exam, pt anxious.  States she has anxiety at baseline and does take some chronic meds.  When asked if respiratory effort is any better she does feel it's improved somewhat but not back to baseline.   Will give 42m lasix x 1.  Add PRN morphine for air hunger. Transfer to ICU for closer obs. If no improvement, Dr. JCarson Myrtleto consider pigtail placement tonight.    RMontey Hora PMinoaPulmonary & Critical Care Medicine For pager details, please see AMION or use Epic chat  After 1900, please call [facility] coverage needs 02/01/2021, 10:00 PM

## 2021-02-02 ENCOUNTER — Inpatient Hospital Stay (HOSPITAL_COMMUNITY): Payer: Medicare Other

## 2021-02-02 ENCOUNTER — Ambulatory Visit (HOSPITAL_COMMUNITY): Payer: Medicare Other | Admitting: Hematology

## 2021-02-02 DIAGNOSIS — J9601 Acute respiratory failure with hypoxia: Secondary | ICD-10-CM | POA: Diagnosis not present

## 2021-02-02 LAB — BASIC METABOLIC PANEL
Anion gap: 8 (ref 5–15)
BUN: 5 mg/dL — ABNORMAL LOW (ref 6–20)
CO2: 24 mmol/L (ref 22–32)
Calcium: 8 mg/dL — ABNORMAL LOW (ref 8.9–10.3)
Chloride: 101 mmol/L (ref 98–111)
Creatinine, Ser: 0.6 mg/dL (ref 0.44–1.00)
GFR, Estimated: >60 mL/min (ref >60)
Glucose, Bld: 108 mg/dL — ABNORMAL HIGH (ref 70–99)
Potassium: 4.3 mmol/L (ref 3.5–5.1)
Sodium: 133 mmol/L — ABNORMAL LOW (ref 135–145)

## 2021-02-02 LAB — POCT I-STAT 7, (LYTES, BLD GAS, ICA,H+H)
Acid-Base Excess: 6 mmol/L — ABNORMAL HIGH (ref 0.0–2.0)
Acid-Base Excess: 7 mmol/L — ABNORMAL HIGH (ref 0.0–2.0)
Bicarbonate: 30.1 mmol/L — ABNORMAL HIGH (ref 20.0–28.0)
Bicarbonate: 31.3 mmol/L — ABNORMAL HIGH (ref 20.0–28.0)
Calcium, Ion: 1.12 mmol/L — ABNORMAL LOW (ref 1.15–1.40)
Calcium, Ion: 1.12 mmol/L — ABNORMAL LOW (ref 1.15–1.40)
HCT: 26 % — ABNORMAL LOW (ref 36.0–46.0)
HCT: 28 % — ABNORMAL LOW (ref 36.0–46.0)
Hemoglobin: 8.8 g/dL — ABNORMAL LOW (ref 12.0–15.0)
Hemoglobin: 9.5 g/dL — ABNORMAL LOW (ref 12.0–15.0)
O2 Saturation: 99 %
O2 Saturation: 94 %
Patient temperature: 98.9
Patient temperature: 98.8
Potassium: 3.7 mmol/L (ref 3.5–5.1)
Potassium: 3.6 mmol/L (ref 3.5–5.1)
Sodium: 135 mmol/L (ref 135–145)
Sodium: 134 mmol/L — ABNORMAL LOW (ref 135–145)
TCO2: 31 mmol/L (ref 22–32)
TCO2: 33 mmol/L — ABNORMAL HIGH (ref 22–32)
pCO2 arterial: 39.5 mmHg (ref 32.0–48.0)
pCO2 arterial: 42.5 mmHg (ref 32.0–48.0)
pH, Arterial: 7.491 — ABNORMAL HIGH (ref 7.350–7.450)
pH, Arterial: 7.475 — ABNORMAL HIGH (ref 7.350–7.450)
pO2, Arterial: 109 mmHg — ABNORMAL HIGH (ref 83.0–108.0)
pO2, Arterial: 65 mmHg — ABNORMAL LOW (ref 83.0–108.0)

## 2021-02-02 LAB — BODY FLUID CELL COUNT WITH DIFFERENTIAL
Eos, Fluid: 0 %
Lymphs, Fluid: 5 %
Monocyte-Macrophage-Serous Fluid: 2 % — ABNORMAL LOW (ref 50–90)
Neutrophil Count, Fluid: 93 % — ABNORMAL HIGH (ref 0–25)
Total Nucleated Cell Count, Fluid: 354 cu mm (ref 0–1000)

## 2021-02-02 LAB — LACTATE DEHYDROGENASE, PLEURAL OR PERITONEAL FLUID: LD, Fluid: 937 U/L — ABNORMAL HIGH (ref 3–23)

## 2021-02-02 LAB — PROTEIN, PLEURAL OR PERITONEAL FLUID: Total protein, fluid: 3.5 g/dL

## 2021-02-02 LAB — GLUCOSE, PLEURAL OR PERITONEAL FLUID: Glucose, Fluid: 91 mg/dL

## 2021-02-02 LAB — MRSA NEXT GEN BY PCR, NASAL: MRSA by PCR Next Gen: NOT DETECTED

## 2021-02-02 LAB — LACTIC ACID, PLASMA: Lactic Acid, Venous: 1.1 mmol/L (ref 0.5–1.9)

## 2021-02-02 LAB — TROPONIN I (HIGH SENSITIVITY): Troponin I (High Sensitivity): 66 ng/L — ABNORMAL HIGH (ref <18)

## 2021-02-02 IMAGING — DX DG CHEST 1V PORT
2 series · 2 of 2 positions shown · non-contrast
Comparison: Chest x-ray earlier the same day

CLINICAL DATA: Chest tube evaluation

EXAM:
PORTABLE CHEST 1 VIEW

[chest ap (1 of 2)]
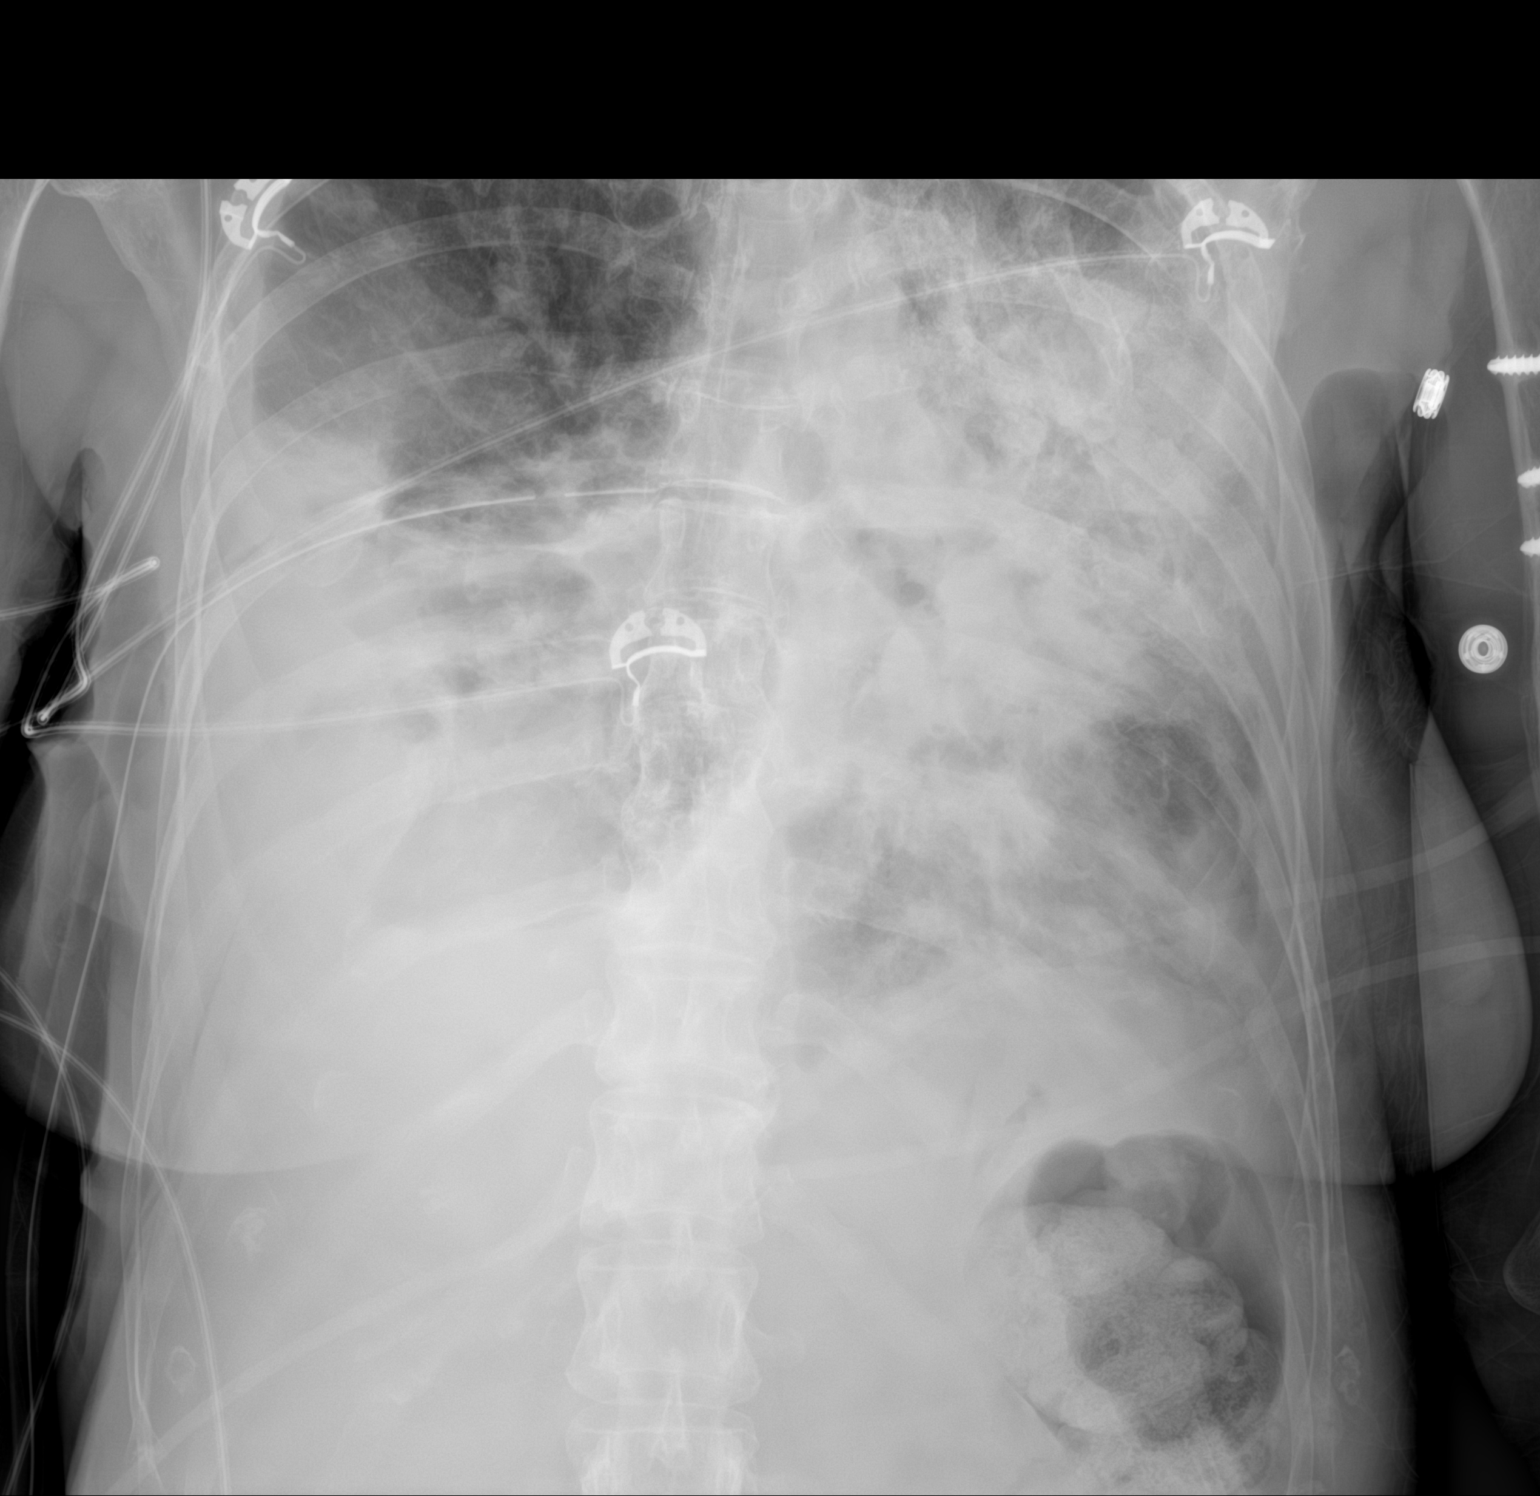

[chest ap (2 of 2)]
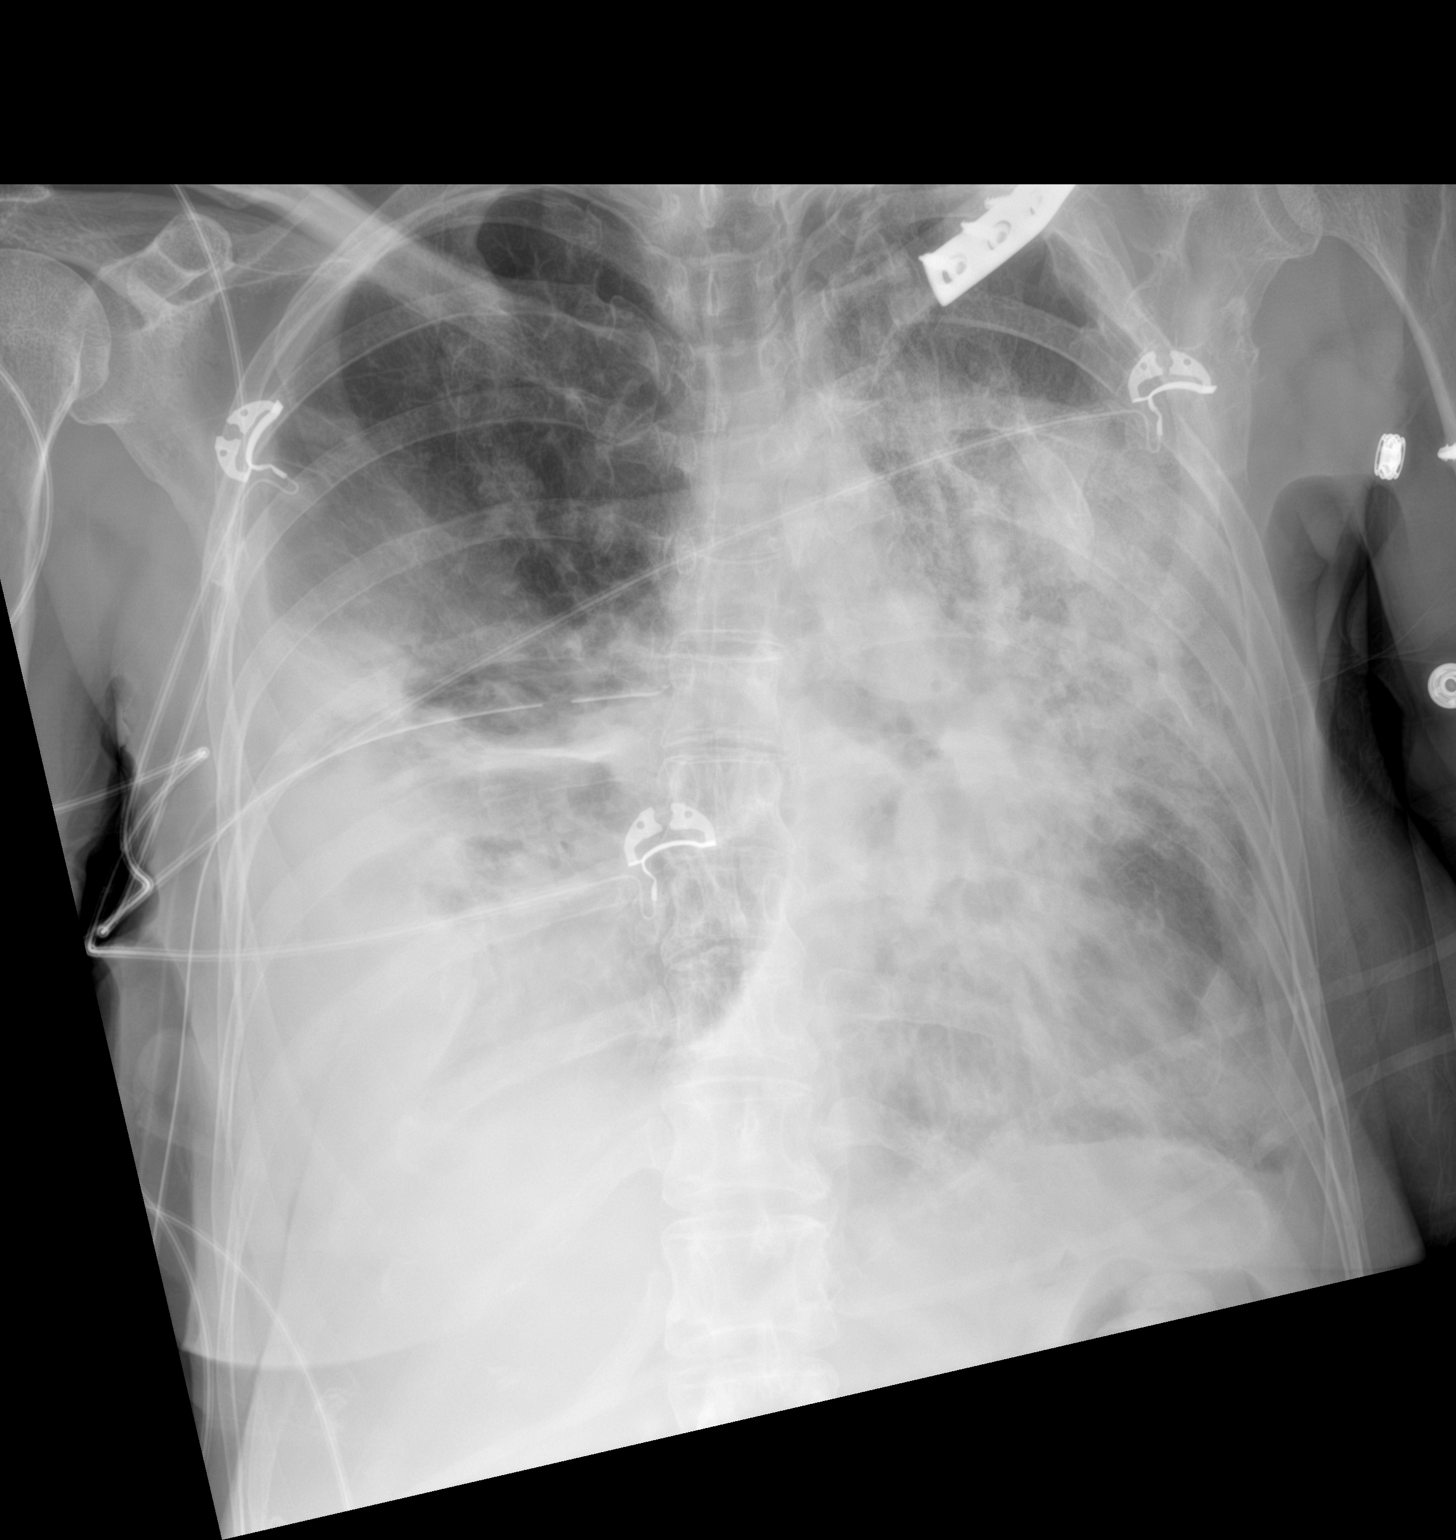

[2 of 2 positions shown; findings below may reference images not displayed]

FINDINGS: Interval placement of a right-sided chest tube with the tip in the
medial midlung zone. Mildly decreased size of a large loculated
right-sided pleural effusion, with associated
atelectasis/infiltrate. Extensive airspace opacities again seen
throughout the left lung mostly in the mid lung zone, unchanged. No
pneumothorax visualized.
IMPRESSION: 1. Right-sided chest tube in place. Mildly decreased size of a
right-sided loculated pleural effusion with associated
atelectasis/infiltrate.
2. No significant change in the left lung.

## 2021-02-02 IMAGING — DX DG CHEST 1V PORT
1 series · 1 of 1 positions shown · non-contrast
Comparison: [DATE]

CLINICAL DATA: Hypoxia, shortness of breath

EXAM:
PORTABLE CHEST 1 VIEW

[chest ap]
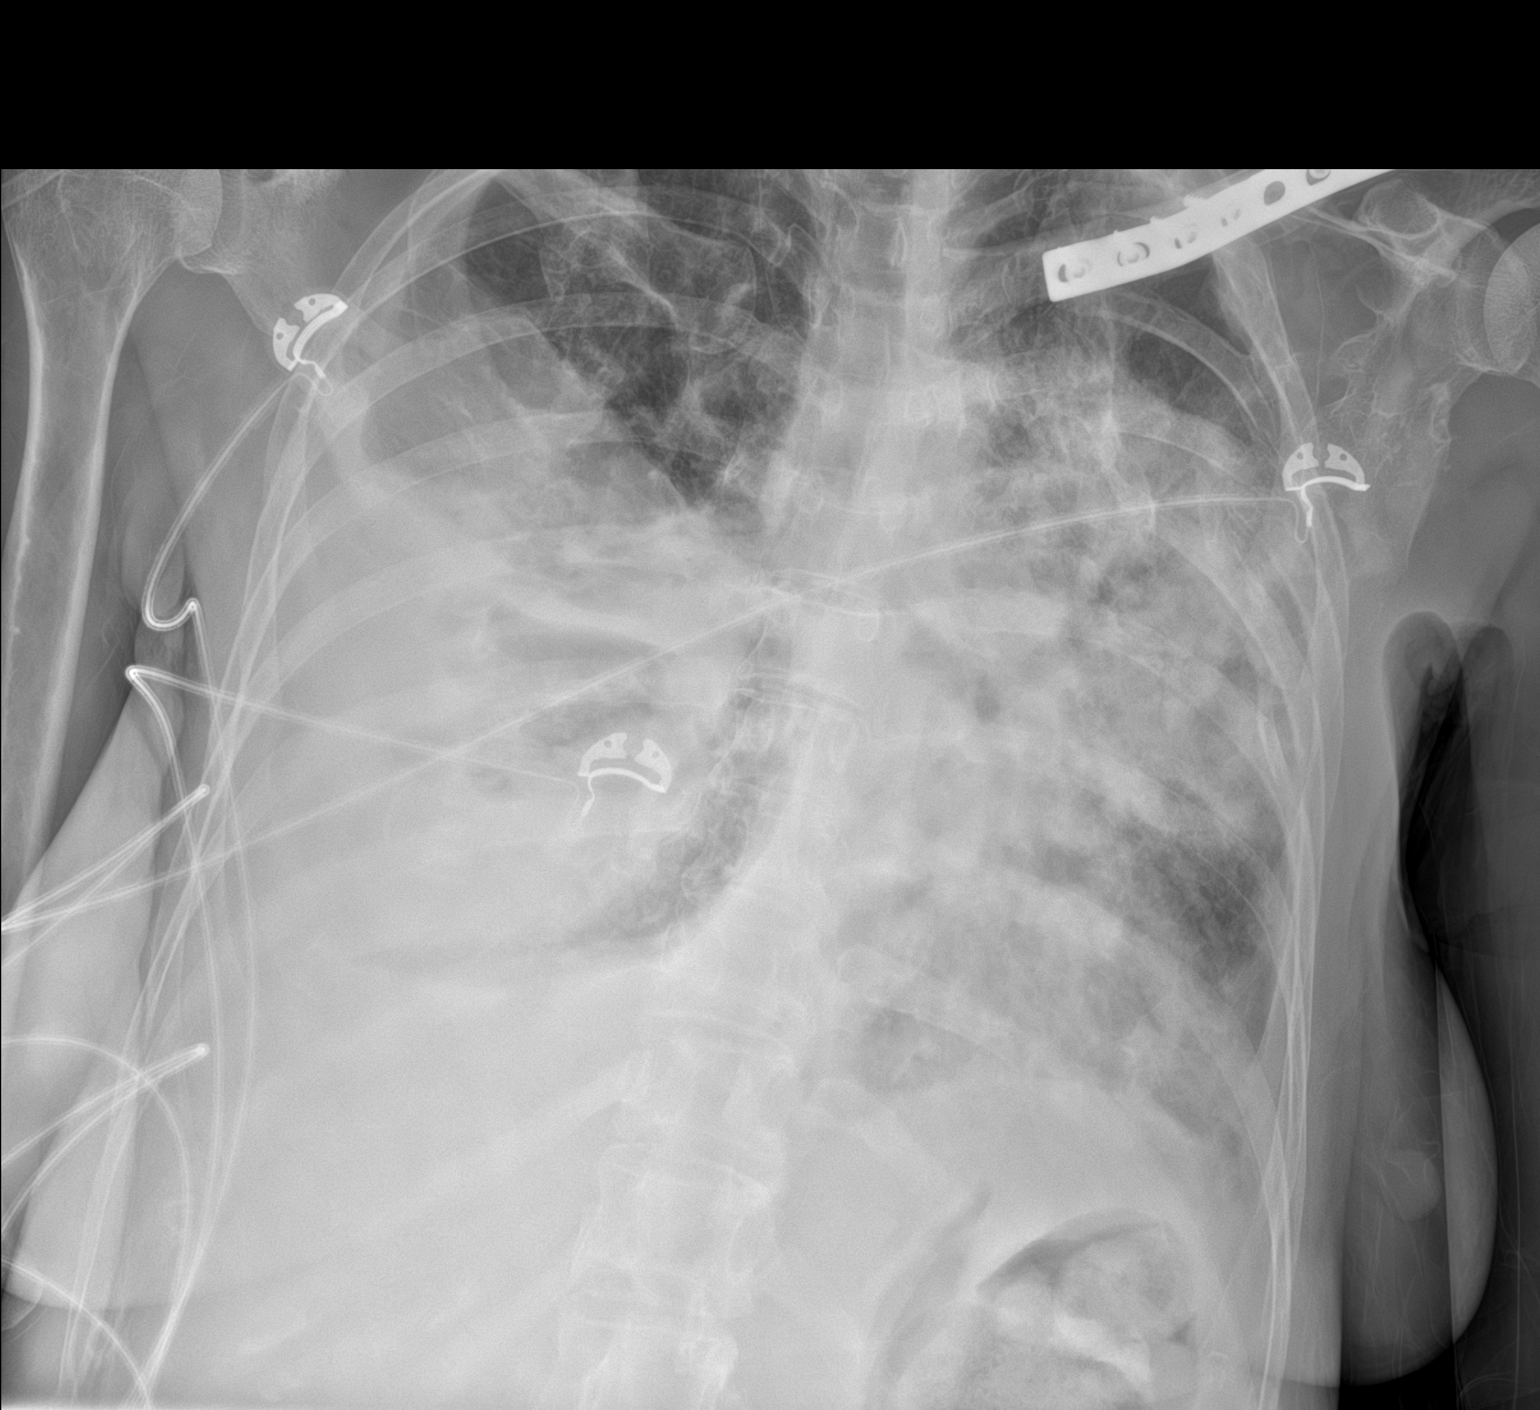

[1 of 1 positions shown; findings below may reference images not displayed]

FINDINGS: Large loculated right pleural effusion. Multifocal patchy opacities
in the left mid lung and right lower lobe, suggesting multifocal
pneumonia, similar.

No pneumothorax.

The heart is normal in size.

Status post ORIF of the left clavicle.
IMPRESSION: Multifocal pneumonia, similar.

Large loculated right pleural effusion, unchanged.

## 2021-02-02 MED ORDER — FUROSEMIDE 10 MG/ML IJ SOLN
40.0000 mg | Freq: Once | INTRAMUSCULAR | Status: AC
  Administered 2021-02-02: 40 mg via INTRAVENOUS
  Filled 2021-02-02: qty 4

## 2021-02-02 MED ORDER — DEXMEDETOMIDINE HCL IN NACL 400 MCG/100ML IV SOLN
0.0000 ug/kg/h | INTRAVENOUS | Status: DC
  Administered 2021-02-02: 0.2 ug/kg/h via INTRAVENOUS
  Administered 2021-02-02: 1.2 ug/kg/h via INTRAVENOUS
  Administered 2021-02-02 – 2021-02-03 (×2): 0.5 ug/kg/h via INTRAVENOUS
  Administered 2021-02-03: 0.6 ug/kg/h via INTRAVENOUS
  Administered 2021-02-04: 1.2 ug/kg/h via INTRAVENOUS
  Administered 2021-02-04: 0.8 ug/kg/h via INTRAVENOUS
  Administered 2021-02-05: 0.9 ug/kg/h via INTRAVENOUS
  Administered 2021-02-06: 0.2 ug/kg/h via INTRAVENOUS
  Administered 2021-02-06: 0.4 ug/kg/h via INTRAVENOUS
  Filled 2021-02-02 (×9): qty 100

## 2021-02-02 MED ORDER — FENTANYL CITRATE PF 50 MCG/ML IJ SOSY
PREFILLED_SYRINGE | INTRAMUSCULAR | Status: AC
  Filled 2021-02-02: qty 1

## 2021-02-02 MED ORDER — LIDOCAINE HCL (PF) 1 % IJ SOLN
INTRAMUSCULAR | Status: AC
  Filled 2021-02-02: qty 30

## 2021-02-02 MED ORDER — ALBUTEROL SULFATE (2.5 MG/3ML) 0.083% IN NEBU
2.5000 mg | INHALATION_SOLUTION | RESPIRATORY_TRACT | Status: DC | PRN
  Administered 2021-02-06: 2.5 mg via RESPIRATORY_TRACT
  Filled 2021-02-02: qty 3

## 2021-02-02 MED ORDER — ALBUTEROL SULFATE (2.5 MG/3ML) 0.083% IN NEBU
INHALATION_SOLUTION | RESPIRATORY_TRACT | Status: AC
  Administered 2021-02-02: 2.5 mg via RESPIRATORY_TRACT
  Filled 2021-02-02: qty 3

## 2021-02-02 MED ORDER — FENTANYL CITRATE PF 50 MCG/ML IJ SOSY
25.0000 ug | PREFILLED_SYRINGE | Freq: Once | INTRAMUSCULAR | Status: AC
  Administered 2021-02-02: 25 ug via INTRAVENOUS

## 2021-02-02 MED ORDER — SODIUM CHLORIDE (PF) 0.9 % IJ SOLN
10.0000 mg | Freq: Every day | INTRAMUSCULAR | Status: DC
  Administered 2021-02-02 – 2021-02-03 (×2): 10 mg via INTRAPLEURAL
  Filled 2021-02-02 (×3): qty 10

## 2021-02-02 MED ORDER — SODIUM CHLORIDE 0.9 % IV SOLN
2.0000 g | INTRAVENOUS | Status: DC
  Administered 2021-02-02 – 2021-02-04 (×3): 2 g via INTRAVENOUS
  Filled 2021-02-02 (×4): qty 20

## 2021-02-02 MED ORDER — SODIUM CHLORIDE 0.9% FLUSH
10.0000 mL | Freq: Three times a day (TID) | INTRAVENOUS | Status: DC
  Administered 2021-02-02 – 2021-02-17 (×35): 10 mL

## 2021-02-02 MED ORDER — IOHEXOL 300 MG/ML  SOLN
65.0000 mL | Freq: Once | INTRAMUSCULAR | Status: AC | PRN
  Administered 2021-02-02: 65 mL via INTRAVENOUS

## 2021-02-02 MED ORDER — FENTANYL CITRATE PF 50 MCG/ML IJ SOSY
25.0000 ug | PREFILLED_SYRINGE | Freq: Once | INTRAMUSCULAR | Status: AC
Start: 2021-02-02 — End: 2021-02-02
  Administered 2021-02-02: 25 ug via INTRAVENOUS

## 2021-02-02 MED ORDER — STERILE WATER FOR INJECTION IJ SOLN
5.0000 mg | Freq: Every day | INTRAMUSCULAR | Status: DC
Start: 2021-02-02 — End: 2021-02-04
  Administered 2021-02-02 – 2021-02-03 (×2): 5 mg via INTRAPLEURAL
  Filled 2021-02-02 (×4): qty 5

## 2021-02-02 NOTE — Progress Notes (Signed)
Elink notified about pt's RR and WOB despite precedex gtt. RR in 40s--personally counted by this RN. Pt w/ accessory muscle use and grunting. O2 sats stable at this time on 15L NRB mask. Awaiting further orders. Care ongoing.

## 2021-02-02 NOTE — Procedures (Signed)
Insertion of Chest Tube Procedure Note  Davanee Klinkner  [id number]  1970-09-03  Date:02/02/21  Time:9:27 AM    Provider Performing: Kipp Brood   Procedure: Chest Tube Insertion (39432)  Indication(s) Effusion  Consent Risks of the procedure as well as the alternatives and risks of each were explained to the patient and/or caregiver.  Consent for the procedure was obtained and is signed in the bedside chart  Anesthesia Topical only with 1% lidocaine    Time Out Verified patient identification, verified procedure, site/side was marked, verified correct patient position, special equipment/implants available, medications/allergies/relevant history reviewed, required imaging and test results available.   Sterile Technique Maximal sterile technique including full sterile barrier drape, hand hygiene, sterile gown, sterile gloves, mask, hair covering, sterile ultrasound probe cover (if used).   Procedure Description Ultrasound used to identify appropriate pleural anatomy for placement and overlying skin marked. Area of placement cleaned and draped in sterile fashion.  A 20 French chest tube was placed into the right pleural space using Kelly dissection. Appropriate return of serosanguinous fluid was obtained.  The tube was connected to atrium and placed on -20 cm H2O wall suction.   Complications/Tolerance None; patient tolerated the procedure well. Chest X-ray is ordered to verify placement.   EBL Minimal  Specimen(s) fluid  Kipp Brood, MD [facility] ICU Physician [place]  Pager: [phone] Or Epic Secure Chat After hours: [phone].  02/02/2021, 9:29 AM

## 2021-02-02 NOTE — Significant Event (Signed)
OVERNIGHT COVERAGE CRITICAL CARE PROGRESS NOTE  CTSP re: increased work of breathing and hypoxia.  Patient transferred from [place] to [place] in the interim.  Has produced about 1400 mL urine after furosemide 40 mg IV single dose.  RR ~40, on NRB, SpO2 100%.    Blood pressure (!) 107/56, pulse (!) 124, temperature 99.6 F (37.6 C), temperature source Oral, resp. rate (!) 35, height 5' 4"  (1.626 m), weight 52.3 kg, SpO2 95 %.  Overall, she actually appears improved compared to when she was seen earlier.  On Precedex, in lieu of psych meds, as nurse was uncomfortable taking off norebreather to administer meds due to profound hypoxia.  Mentating better.  Fully conversant, in fact, patient claims breathing is better.  On exam, still has coarse rales and rhonchi bilaterally anteriorly.  ABG obtained.  PaO2 has improved from 57-->109.  ABG    Component Value Date/Time   PHART 7.491 (H) 02/02/2021 0420   PCO2ART 39.5 02/02/2021 0420   PO2ART 109 (H) 02/02/2021 0420   HCO3 30.1 (H) 02/02/2021 0420   TCO2 31 02/02/2021 0420   O2SAT 99.0 02/02/2021 0420   Repeat furosemide 40 mg IV single dose. Obtained consent for chest tube from guardian but will hold off for now, given improvement in ABG.  And, TCTS should have the opportunity to weigh in with recommendations (possible decortication).  Renee Pain, MD Board Certified by the ABIM, [place]

## 2021-02-02 NOTE — Progress Notes (Signed)
Patient appears to be uncomfortable w/ apparent mild respiratory distress. RR 48-50bpm counted. BBS to auscultation reveals diffuse coarse crackles w/ pleural friction rub noted on right anterior-lateral chest. Patient remains on NRB per CCM MD. Intermittently patient has had periods of clinical presentation changes throughout the night. MD notified of the changes per nursing; w/ repeat ABG ordered per MD to assess Patient follows commands, but is tachypneic and has labored respirations.  No new orders given.   HR: 122 RR: 48 Spo2: 98 BP: 160/130 @ 0621  Jaquita Bessire L. Tamala Julian, BS, RRT-ACCS, RCP

## 2021-02-02 NOTE — Progress Notes (Addendum)
Hold NIV per PA Rahul Shearon Stalls

## 2021-02-02 NOTE — Procedures (Signed)
Pleural Fibrinolytic Administration Procedure Note  Margaret [name]  [id number]  14-Dec-1970  Date:02/02/21  Time:4:34 PM   Provider Performing:Matthias Bogus Loletha Grayer Tamala Julian   Procedure: Pleural Fibrinolysis Initial day [phone])  Indication(s) Fibrinolysis of complicated pleural effusion  Consent Risks of the procedure as well as the alternatives and risks of each were explained to the patient and/or caregiver.  Consent for the procedure was obtained.   Anesthesia None   Time Out Verified patient identification, verified procedure, site/side was marked, verified correct patient position, special equipment/implants available, medications/allergies/relevant history reviewed, required imaging and test results available.   Sterile Technique Hand hygiene, gloves   Procedure Description Existing pleural catheter was cleaned and accessed in sterile manner.  33m of tPA in 30cc of saline and 526mof dornase in 30cc of sterile water were injected into pleural space using existing pleural catheter.  Catheter will be clamped for 1 hour and then placed back to suction.   Complications/Tolerance None; patient tolerated the procedure well.  EBL None   Specimen(s) None

## 2021-02-02 NOTE — H&P (Signed)
NAME:  Margaret [name], MRN:  [id number], DOB:  Sep 06, 1970, LOS: 3 ADMISSION DATE:  01/29/2021, CONSULTATION DATE:  10/30 REFERRING MD:  Roderic Palau, REASON FOR CONSULT: pleural effusion  History of Present Illness:  Margaret [name] is a 50 y.o. female who is seen in consultation at the request of Dr. Roderic Palau for recommendations on further evaluation and management of pleural effusion.   Margaret [name], is a 50 y.o. female, who [facility] on [facility]/29 with a chief complaint of difficulty with ambulation  They have a pertinent past medical history of ovarian cancer, hep B carrier, intelectual disability, TB (childhood), currently lives in group home.  ED course was notable for workup for a worsening cough which had been present for two days, CT chest demonstrated a large partially loculated right pleural effusion with atelectasis of the right upper lobe. She was also found to have a temp of 100.9, WBC 31.9, HBG 9.3.  She was admitted to the hopsitalist service and treatment was initiated for CAP. She was initially started on doxy but is now on Ceftriaxone and azithromycin   Appears that the patient was accepted to [facility] service.   Pertinent  Medical History  ovarian cancer, hep B carrier, intelectual disability, TB (childhood), currently lives in group home  No Name Hospital Events: Including procedures, antibiotic start and stop dates in addition to other pertinent events   10/29 Admit to [facility]. CT chest shows R pleural effusion. 10/30-31 TXR to [facility].  Interim History / Subjective:   Worsening respiratory distress overnight.  Escalating oxygen requirements now on 100% nonrebreather.  Now only able to speak in short phrases.  Objective   Blood pressure (!) 90/56, pulse 96, temperature 99.6 F (37.6 C), temperature source Oral, resp. rate (!) 33, height 5' 4"  (1.626 m), weight 52.2 kg, SpO2 100 %.    FiO2 (%):  [100 %] 100 %   Intake/Output Summary (Last 24 hours) at 02/02/2021  0930 Last data filed at 02/02/2021 0800 Gross per 24 hour  Intake 1631.45 ml  Output 1975 ml  Net -343.55 ml    Filed Weights   02/01/21 1715 02/01/21 2245 02/02/21 0400  Weight: 52.5 kg 52.3 kg 52.2 kg    Examination: General appearance: Moderate distress.  Somnolent. HENT: No scleral icterus.  Oral mucosa intact Neck: Trachea midline; FROM, supple, lymphadenopathy, no JVD Lungs: absent breath sounds in the right base rhonchi left side CV: RRR, S1, S2, no MRGs  Abdomen: Soft NT ND  Neuro: AAOX2  Resolved Hospital Problem list     Assessment & Plan:   Critically ill due to acute hypoxic respiratory failure requiring nonrebreather due to large right-sided pleural effusion, loculated  Elevated CA19-9 Recent CAP Right adrenal mass Schizophrenia, controlled  Hypokalemia Hep B Normocytic Anemia Malnutrition-unspecified  Plan:  -CT scan personally reviewed and discussed with Dr. Kipp Brood.  Plan to proceed with placement of formal chest tube to ensure optimal drainage.  We will repeat CT scan to better evaluate new left lung pathology.  Suspect either aspiration or mucous plugging.  Also possible ARDS from spreading infection. -Elevated CA 19-9 raises concern for ovarian malignancy.  Cystic lesion present but no evidence of intraperitoneal spread.  Pleural metastases without intra-abdominal disease would seem unusual. -Continue usual home medications.  We will place core track tube to facilitate  Best Practice (right click and "Reselect all SmartList Selections" daily)   Best Practice (right click and "Reselect all SmartList Selections" daily)   Diet/type: NPO DVT prophylaxis: prophylactic  heparin  GI prophylaxis: N/A Lines: N/A Foley:  N/A Code Status:  full code Last date of multidisciplinary goals of care discussion [Will need to update guardian once CT performed]  Labs   CBC: Recent Labs  Lab 01/30/21 0016 01/31/21 0457 02/01/21 0524 02/01/21 2146  02/02/21 0420 02/02/21 0627  WBC 31.9* 38.9* 34.1* 38.4*  --   --   NEUTROABS  --  34.6*  --   --   --   --   HGB 9.3* 9.0* 7.8* 8.6* 8.8* 9.5*  HCT 29.5* 28.1* 23.7* 26.9* 26.0* 28.0*  MCV 87.5 87.5 87.1 85.1  --   --   PLT 356 372 329 349  --   --      Basic Metabolic Panel: Recent Labs  Lab 01/30/21 0016 01/31/21 0457 02/01/21 0524 02/01/21 2146 02/02/21 0420 02/02/21 0426 02/02/21 0627  NA 133* 137 132* 134* 135 133* 134*  K 3.2* 3.5 3.0* 3.3* 3.7 4.3 3.6  CL 98 104 100 100  --  101  --   CO2 28 25 24 24   --  24  --   GLUCOSE 137* 100* 189* 159*  --  108*  --   BUN 9 7 7  5*  --  5*  --   CREATININE 0.61 0.49 0.57 0.64  --  0.60  --   CALCIUM 8.3* 7.8* 7.4* 7.6*  --  8.0*  --   MG  --  1.9  --   --   --   --   --     GFR: Estimated Creatinine Clearance: 70.9 mL/min (by C-G formula based on SCr of 0.6 mg/dL). Recent Labs  Lab 01/30/21 0016 01/30/21 0325 01/30/21 0557 01/31/21 0457 02/01/21 0524 02/01/21 2146 02/02/21 0101  PROCALCITON  --   --   --   --   --  0.65  --   WBC 31.9*  --   --  38.9* 34.1* 38.4*  --   LATICACIDVEN  --  0.7 0.9  --   --  1.5 1.1     Liver Function Tests: Recent Labs  Lab 01/30/21 0016 01/31/21 0457 02/01/21 0524 02/01/21 2146  AST 19 21 21 28   ALT 22 22 20 25   ALKPHOS 87 88 78 94  BILITOT 0.3 0.4 0.4 0.4  PROT 5.6* 5.0* 4.6* 5.3*  ALBUMIN 2.1* 1.8* 1.5* 1.5*    Recent Labs  Lab 01/30/21 0016  LIPASE 26    No results for input(s): AMMONIA in the last 168 hours.  ABG    Component Value Date/Time   PHART 7.475 (H) 02/02/2021 0627   PCO2ART 42.5 02/02/2021 0627   PO2ART 65 (L) 02/02/2021 0627   HCO3 31.3 (H) 02/02/2021 0627   TCO2 33 (H) 02/02/2021 0627   O2SAT 94.0 02/02/2021 0627     Coagulation Profile: No results for input(s): INR, PROTIME in the last 168 hours.  Cardiac Enzymes: No results for input(s): CKTOTAL, CKMB, CKMBINDEX, TROPONINI in the last 168 hours.  HbA1C: No results found for:  HGBA1C  CBG: Recent Labs  Lab 02/01/21 2305  GLUCAP 132*    CRITICAL CARE Performed by: Kipp Brood   Total critical care time: 40 minutes  Critical care time was exclusive of separately billable procedures and treating other patients.  Critical care was necessary to treat or prevent imminent or life-threatening deterioration.  Critical care was time spent personally by me on the following activities: development of treatment plan with patient and/or surrogate as well as  nursing, discussions with consultants, evaluation of patient's response to treatment, examination of patient, obtaining history from patient or surrogate, ordering and performing treatments and interventions, ordering and review of laboratory studies, ordering and review of radiographic studies, pulse oximetry, re-evaluation of patient's condition and participation in multidisciplinary rounds.  Kipp Brood, MD [facility] ICU Physician [place]  Pager: [phone] Mobile: [phone] After hours: [phone].

## 2021-02-03 ENCOUNTER — Inpatient Hospital Stay (HOSPITAL_COMMUNITY): Payer: Medicare Other

## 2021-02-03 DIAGNOSIS — J90 Pleural effusion, not elsewhere classified: Secondary | ICD-10-CM | POA: Diagnosis not present

## 2021-02-03 LAB — CBC
HCT: 26.5 % — ABNORMAL LOW (ref 36.0–46.0)
Hemoglobin: 8.3 g/dL — ABNORMAL LOW (ref 12.0–15.0)
MCH: 26.9 pg (ref 26.0–34.0)
MCHC: 31.3 g/dL (ref 30.0–36.0)
MCV: 85.8 fL (ref 80.0–100.0)
Platelets: 353 10*3/uL (ref 150–400)
RBC: 3.09 MIL/uL — ABNORMAL LOW (ref 3.87–5.11)
RDW: 13.7 % (ref 11.5–15.5)
WBC: 27.3 10*3/uL — ABNORMAL HIGH (ref 4.0–10.5)
nRBC: 0 % (ref 0.0–0.2)

## 2021-02-03 LAB — BASIC METABOLIC PANEL
Anion gap: 9 (ref 5–15)
BUN: 7 mg/dL (ref 6–20)
CO2: 28 mmol/L (ref 22–32)
Calcium: 7.6 mg/dL — ABNORMAL LOW (ref 8.9–10.3)
Chloride: 94 mmol/L — ABNORMAL LOW (ref 98–111)
Creatinine, Ser: 0.52 mg/dL (ref 0.44–1.00)
GFR, Estimated: >60 mL/min (ref >60)
Glucose, Bld: 141 mg/dL — ABNORMAL HIGH (ref 70–99)
Potassium: 3.4 mmol/L — ABNORMAL LOW (ref 3.5–5.1)
Sodium: 131 mmol/L — ABNORMAL LOW (ref 135–145)

## 2021-02-03 LAB — CYTOLOGY - NON PAP

## 2021-02-03 IMAGING — DX DG CHEST 1V PORT
1 series · 1 of 1 positions shown · non-contrast
Comparison: Of [DATE]

CLINICAL DATA: Chest 2

Emphysema
Chest and abdominal pain
EXAM:
PORTABLE CHEST 1 VIEW

[chest]
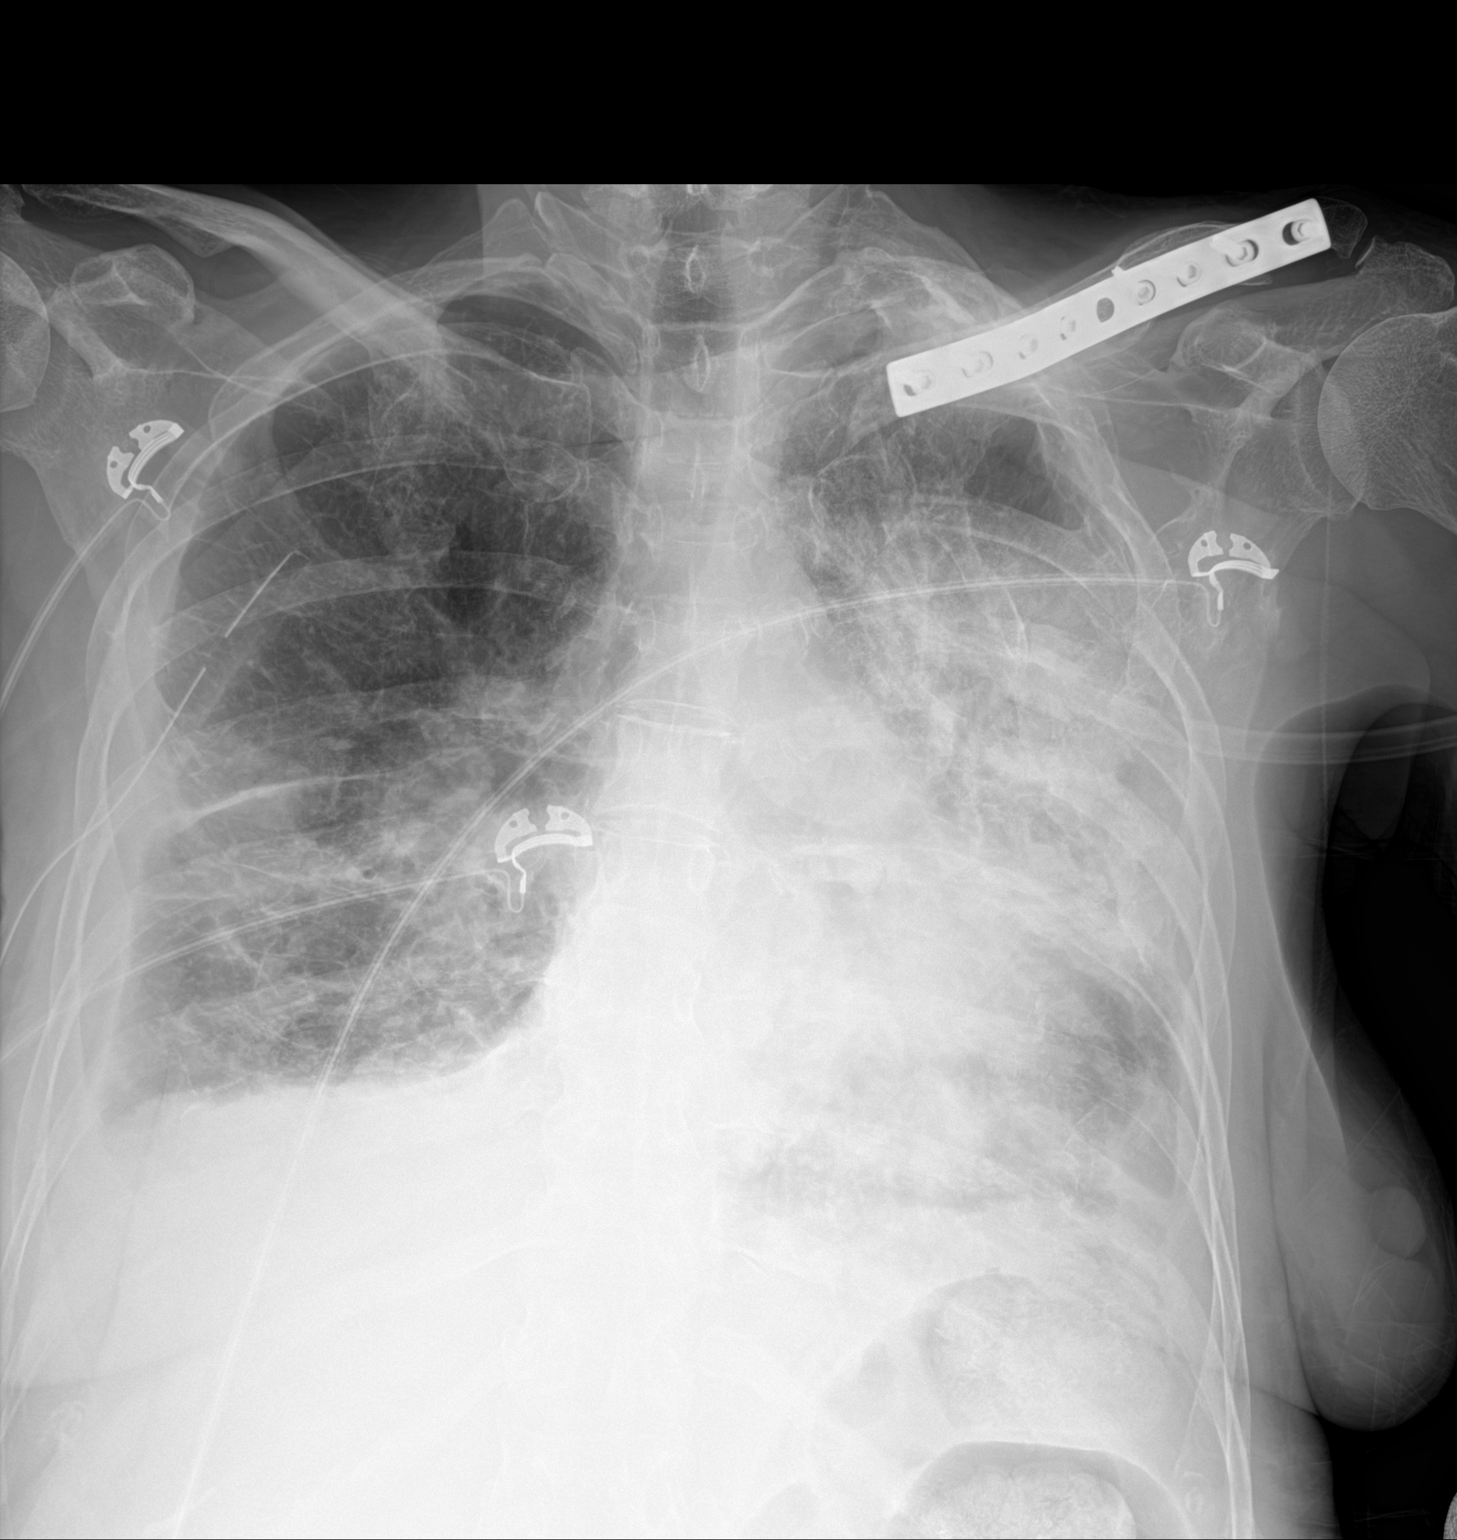

[1 of 1 positions shown; findings below may reference images not displayed]

FINDINGS: Significant interval decrease of right pleural effusion. Right chest
tube is present. There is a linear at edge seen in the right
superolateral lung which is favored to be a skin fold rather than
pneumothorax given that lung markings are seen beyond this region.
Dense airspace opacity in the left lung is similar to prior
examination. Fixation plate seen in the left clavicle.
IMPRESSION: 1. Significant interval decrease of right pleural effusion with
chest tube in place. Linear edge is seen in the superolateral right
upper lung is likely a skin fold given the lung markings are seen
beyond this region.
2. Persistent dense airspace opacity of the left lung.

## 2021-02-03 MED ORDER — POTASSIUM CHLORIDE 10 MEQ/100ML IV SOLN
10.0000 meq | INTRAVENOUS | Status: AC
  Administered 2021-02-03 (×4): 10 meq via INTRAVENOUS
  Filled 2021-02-03 (×4): qty 100

## 2021-02-03 MED ORDER — ENTECAVIR 0.5 MG PO TABS
0.5000 mg | ORAL_TABLET | Freq: Every day | ORAL | Status: DC
  Administered 2021-02-03 – 2021-02-18 (×16): 0.5 mg via ORAL
  Filled 2021-02-03 (×16): qty 1

## 2021-02-03 MED ORDER — FENTANYL CITRATE PF 50 MCG/ML IJ SOSY
25.0000 ug | PREFILLED_SYRINGE | INTRAMUSCULAR | Status: DC | PRN
  Administered 2021-02-03 – 2021-02-04 (×5): 25 ug via INTRAVENOUS
  Filled 2021-02-03 (×5): qty 1

## 2021-02-03 MED ORDER — LACTATED RINGERS IV SOLN: INTRAVENOUS | Status: DC

## 2021-02-03 NOTE — Progress Notes (Addendum)
NAME:  Margaret [name], MRN:  [id number], DOB:  January 29, 1971, LOS: 4 ADMISSION DATE:  01/29/2021, CONSULTATION DATE:  10/30 REFERRING MD:  Roderic Palau, REASON FOR CONSULT: pleural effusion  History of Present Illness:  Margaret [name] is a 50 y.o. female who is seen in consultation at the request of Dr. Roderic Palau for recommendations on further evaluation and management of pleural effusion.   Margaret [name], is a 50 y.o. female, who presented to the [facility] ED on 10/29 with a chief complaint of difficulty with ambulation  They have a pertinent past medical history of ovarian cancer, hep B carrier, intelectual disability, TB (childhood), currently lives in group home.  ED course was notable for workup for a worsening cough which had been present for two days, CT chest demonstrated a large partially loculated right pleural effusion with atelectasis of the right upper lobe. She was also found to have a temp of 100.9, WBC 31.9, HBG 9.3.  She was admitted to the hopsitalist service and treatment was initiated for CAP. She was initially started on doxy but is now on Ceftriaxone and azithromycin   Appears that the patient was accepted to [facility] service.   Pertinent  Medical History  ovarian cancer, hep B carrier, intelectual disability, TB (childhood), currently lives in group home  [place] [facility] Events: Including procedures, antibiotic start and stop dates in addition to other pertinent events   10/29 Admit to [facility]. CT chest shows R pleural effusion. 10/30-31 TXR to [facility]. 11/1 -placement of right fifth intercostal space 20 French chest tube  Interim History / Subjective:   Decreasing O2 requirements.  Chest tube continues to drain.  Still moderate pain at insertion site.  Objective   Blood pressure (!) 90/53, pulse 99, temperature 98.4 F (36.9 C), temperature source Oral, resp. rate (!) 36, height 5' 4"  (1.626 m), weight 52.2 kg, SpO2 92 %.    FiO2 (%):  [55 %] 55 %   Intake/Output Summary (Last 24  hours) at 02/03/2021 1201 Last data filed at 02/03/2021 0600 Gross per 24 hour  Intake 455.62 ml  Output 1470 ml  Net -1014.38 ml    Filed Weights   02/01/21 1715 02/01/21 2245 02/02/21 0400  Weight: 52.5 kg 52.3 kg 52.2 kg    Examination: General appearance: In no distress.  Awake. HENT: No scleral icterus.  Oral mucosa intact Neck: Trachea midline; FROM, supple, lymphadenopathy, no JVD Lungs: Right chest tube in place draining serosanguineous turbid fluid.  Breath sounds are now heard throughout the right lung with no crackles.  Rhonchi and bronchial breath sounds persist on the left. CV: RRR, S1, S2, no MRGs  Abdomen: Soft NT ND  Neuro: AAOX2 no focal deficits  Ancillary tests  Fluid analysis consistent with exudate per Light's criteria Chest x-ray today personally reviewed and shows significant improvement in right pleural effusion.  Left lung consolidation also improving. Improving leukocytosis  Assessment & Plan:   Acute hypoxic respiratory insufficiency requiring nonrebreather due to large right-sided pleural effusion, loculated  Elevated CA19-9 Recent CAP Right adrenal mass Schizophrenia, controlled  Hypokalemia Hep B Normocytic Anemia Malnutrition-unspecified  Plan:  -Continue intrapleural fibrinolytics to facilitate further clearance of what appears to be a parapneumonic effusion. -Continue to wean O2 to keep saturation greater than 92%.  Suspect aspiration event left lung. -Remains on high flow nasal cannula at 15 L/min.  Continue to wean.  Can go to PCU once oxygen requirements less than 6L/min. -Complete 7-day course ceftriaxone. -Elevated CA 19-9 raises concern for ovarian malignancy.  Cystic lesion present but no evidence of intraperitoneal spread.  Pleural metastases without intra-abdominal disease would seem unusual. -Continue usual home medications.    Best Practice (right click and "Reselect all SmartList Selections" daily)   Best Practice (right  click and "Reselect all SmartList Selections" daily)   Diet/type: Regular consistency (see orders) DVT prophylaxis: prophylactic heparin  GI prophylaxis: N/A Lines: N/A Foley:  N/A Code Status:  full code Last date of multidisciplinary goals of care discussion [Will need to update guardian once CT performed]  Labs   CBC: Recent Labs  Lab 01/30/21 0016 01/31/21 0457 02/01/21 0524 02/01/21 2146 02/02/21 0420 02/02/21 0627 02/03/21 0133  WBC 31.9* 38.9* 34.1* 38.4*  --   --  27.3*  NEUTROABS  --  34.6*  --   --   --   --   --   HGB 9.3* 9.0* 7.8* 8.6* 8.8* 9.5* 8.3*  HCT 29.5* 28.1* 23.7* 26.9* 26.0* 28.0* 26.5*  MCV 87.5 87.5 87.1 85.1  --   --  85.8  PLT 356 372 329 349  --   --  353     Basic Metabolic Panel: Recent Labs  Lab 01/31/21 0457 02/01/21 0524 02/01/21 2146 02/02/21 0420 02/02/21 0426 02/02/21 0627 02/03/21 0133  NA 137 132* 134* 135 133* 134* 131*  K 3.5 3.0* 3.3* 3.7 4.3 3.6 3.4*  CL 104 100 100  --  101  --  94*  CO2 25 24 24   --  24  --  28  GLUCOSE 100* 189* 159*  --  108*  --  141*  BUN 7 7 5*  --  5*  --  7  CREATININE 0.49 0.57 0.64  --  0.60  --  0.52  CALCIUM 7.8* 7.4* 7.6*  --  8.0*  --  7.6*  MG 1.9  --   --   --   --   --   --     GFR: Estimated Creatinine Clearance: 70.9 mL/min (by C-G formula based on SCr of 0.52 mg/dL). Recent Labs  Lab 01/30/21 0325 01/30/21 0557 01/31/21 0457 02/01/21 0524 02/01/21 2146 02/02/21 0101 02/03/21 0133  PROCALCITON  --   --   --   --  0.65  --   --   WBC  --   --  38.9* 34.1* 38.4*  --  27.3*  LATICACIDVEN 0.7 0.9  --   --  1.5 1.1  --      Liver Function Tests: Recent Labs  Lab 01/30/21 0016 01/31/21 0457 02/01/21 0524 02/01/21 2146  AST 19 21 21 28   ALT 22 22 20 25   ALKPHOS 87 88 78 94  BILITOT 0.3 0.4 0.4 0.4  PROT 5.6* 5.0* 4.6* 5.3*  ALBUMIN 2.1* 1.8* 1.5* 1.5*    Recent Labs  Lab 01/30/21 0016  LIPASE 26    No results for input(s): AMMONIA in the last 168  hours.  ABG    Component Value Date/Time   PHART 7.475 (H) 02/02/2021 0627   PCO2ART 42.5 02/02/2021 0627   PO2ART 65 (L) 02/02/2021 0627   HCO3 31.3 (H) 02/02/2021 0627   TCO2 33 (H) 02/02/2021 0627   O2SAT 94.0 02/02/2021 0627     Coagulation Profile: No results for input(s): INR, PROTIME in the last 168 hours.  Cardiac Enzymes: No results for input(s): CKTOTAL, CKMB, CKMBINDEX, TROPONINI in the last 168 hours.  HbA1C: No results found for: HGBA1C  CBG: Recent Labs  Lab 02/01/21 2305  GLUCAP 132*  CRITICAL CARE Performed by: Kipp Brood   Total critical care time: 40 minutes  Critical care time was exclusive of separately billable procedures and treating other patients.  Critical care was necessary to treat or prevent imminent or life-threatening deterioration.  Critical care was time spent personally by me on the following activities: development of treatment plan with patient and/or surrogate as well as nursing, discussions with consultants, evaluation of patient's response to treatment, examination of patient, obtaining history from patient or surrogate, ordering and performing treatments and interventions, ordering and review of laboratory studies, ordering and review of radiographic studies, pulse oximetry, re-evaluation of patient's condition and participation in multidisciplinary rounds.  Kipp Brood, MD [facility] ICU Physician [place]  Pager: [phone] Mobile: [phone] After hours: [phone].

## 2021-02-03 NOTE — Progress Notes (Signed)
[place] Progress Note Patient Name: Ula Couvillon DOB: [birth year] MRN: [id number]   Date of Service  02/03/2021  HPI/Events of Note  Resp rate up to 50's although SpO2 is > 90.  Pt agitated and occasionally pulls of O2  eICU Interventions  Pt appears agitated; possibly in pain. - added prn fentanyl 35mg q2hr iv - RN to inc precedex infusion - ordered new CXR to assess chest tube placement     Intervention Category Intermediate Interventions: Respiratory distress - evaluation and management  MAlyah Boehning11/05/2020, 4:21 AM

## 2021-02-03 NOTE — Progress Notes (Signed)
[place] Progress Note Patient Name: Margaret [name] DOB: 03-16-1971 MRN: [id number]   Date of Service  02/03/2021  HPI/Events of Note  K 3.4  eICU Interventions  IV replacement ordered     Intervention Category Minor Interventions: Electrolytes abnormality - evaluation and management  Veronda Gabor 02/03/2021, 3:44 AM

## 2021-02-04 ENCOUNTER — Inpatient Hospital Stay (HOSPITAL_COMMUNITY): Payer: Medicare Other

## 2021-02-04 DIAGNOSIS — J90 Pleural effusion, not elsewhere classified: Secondary | ICD-10-CM | POA: Diagnosis not present

## 2021-02-04 LAB — CBC WITH DIFFERENTIAL/PLATELET
Abs Immature Granulocytes: 0.42 10*3/uL — ABNORMAL HIGH (ref 0.00–0.07)
Basophils Absolute: 0 10*3/uL (ref 0.0–0.1)
Basophils Relative: 0 %
Eosinophils Absolute: 0.3 10*3/uL (ref 0.0–0.5)
Eosinophils Relative: 1 %
HCT: 24 % — ABNORMAL LOW (ref 36.0–46.0)
Hemoglobin: 7.6 g/dL — ABNORMAL LOW (ref 12.0–15.0)
Immature Granulocytes: 2 %
Lymphocytes Relative: 14 %
Lymphs Abs: 3.6 10*3/uL (ref 0.7–4.0)
MCH: 27 pg (ref 26.0–34.0)
MCHC: 31.7 g/dL (ref 30.0–36.0)
MCV: 85.4 fL (ref 80.0–100.0)
Monocytes Absolute: 1.3 10*3/uL — ABNORMAL HIGH (ref 0.1–1.0)
Monocytes Relative: 5 %
Neutro Abs: 20.5 10*3/uL — ABNORMAL HIGH (ref 1.7–7.7)
Neutrophils Relative %: 78 %
Platelets: 336 10*3/uL (ref 150–400)
RBC: 2.81 MIL/uL — ABNORMAL LOW (ref 3.87–5.11)
RDW: 13.7 % (ref 11.5–15.5)
WBC: 26.1 10*3/uL — ABNORMAL HIGH (ref 4.0–10.5)
nRBC: 0 % (ref 0.0–0.2)

## 2021-02-04 LAB — BASIC METABOLIC PANEL
Anion gap: 5 (ref 5–15)
BUN: 6 mg/dL (ref 6–20)
CO2: 26 mmol/L (ref 22–32)
Calcium: 7.7 mg/dL — ABNORMAL LOW (ref 8.9–10.3)
Chloride: 102 mmol/L (ref 98–111)
Creatinine, Ser: 0.47 mg/dL (ref 0.44–1.00)
GFR, Estimated: >60 mL/min (ref >60)
Glucose, Bld: 135 mg/dL — ABNORMAL HIGH (ref 70–99)
Potassium: 3.8 mmol/L (ref 3.5–5.1)
Sodium: 133 mmol/L — ABNORMAL LOW (ref 135–145)

## 2021-02-04 LAB — CULTURE, BLOOD (ROUTINE X 2)
Culture: NO GROWTH
Culture: NO GROWTH

## 2021-02-04 IMAGING — DX DG CHEST 1V PORT
1 series · 1 of 1 positions shown · non-contrast
Comparison: [DATE]

CLINICAL DATA: Follow-up chest tube.  Confusion.

EXAM:
PORTABLE CHEST 1 VIEW

[chest]
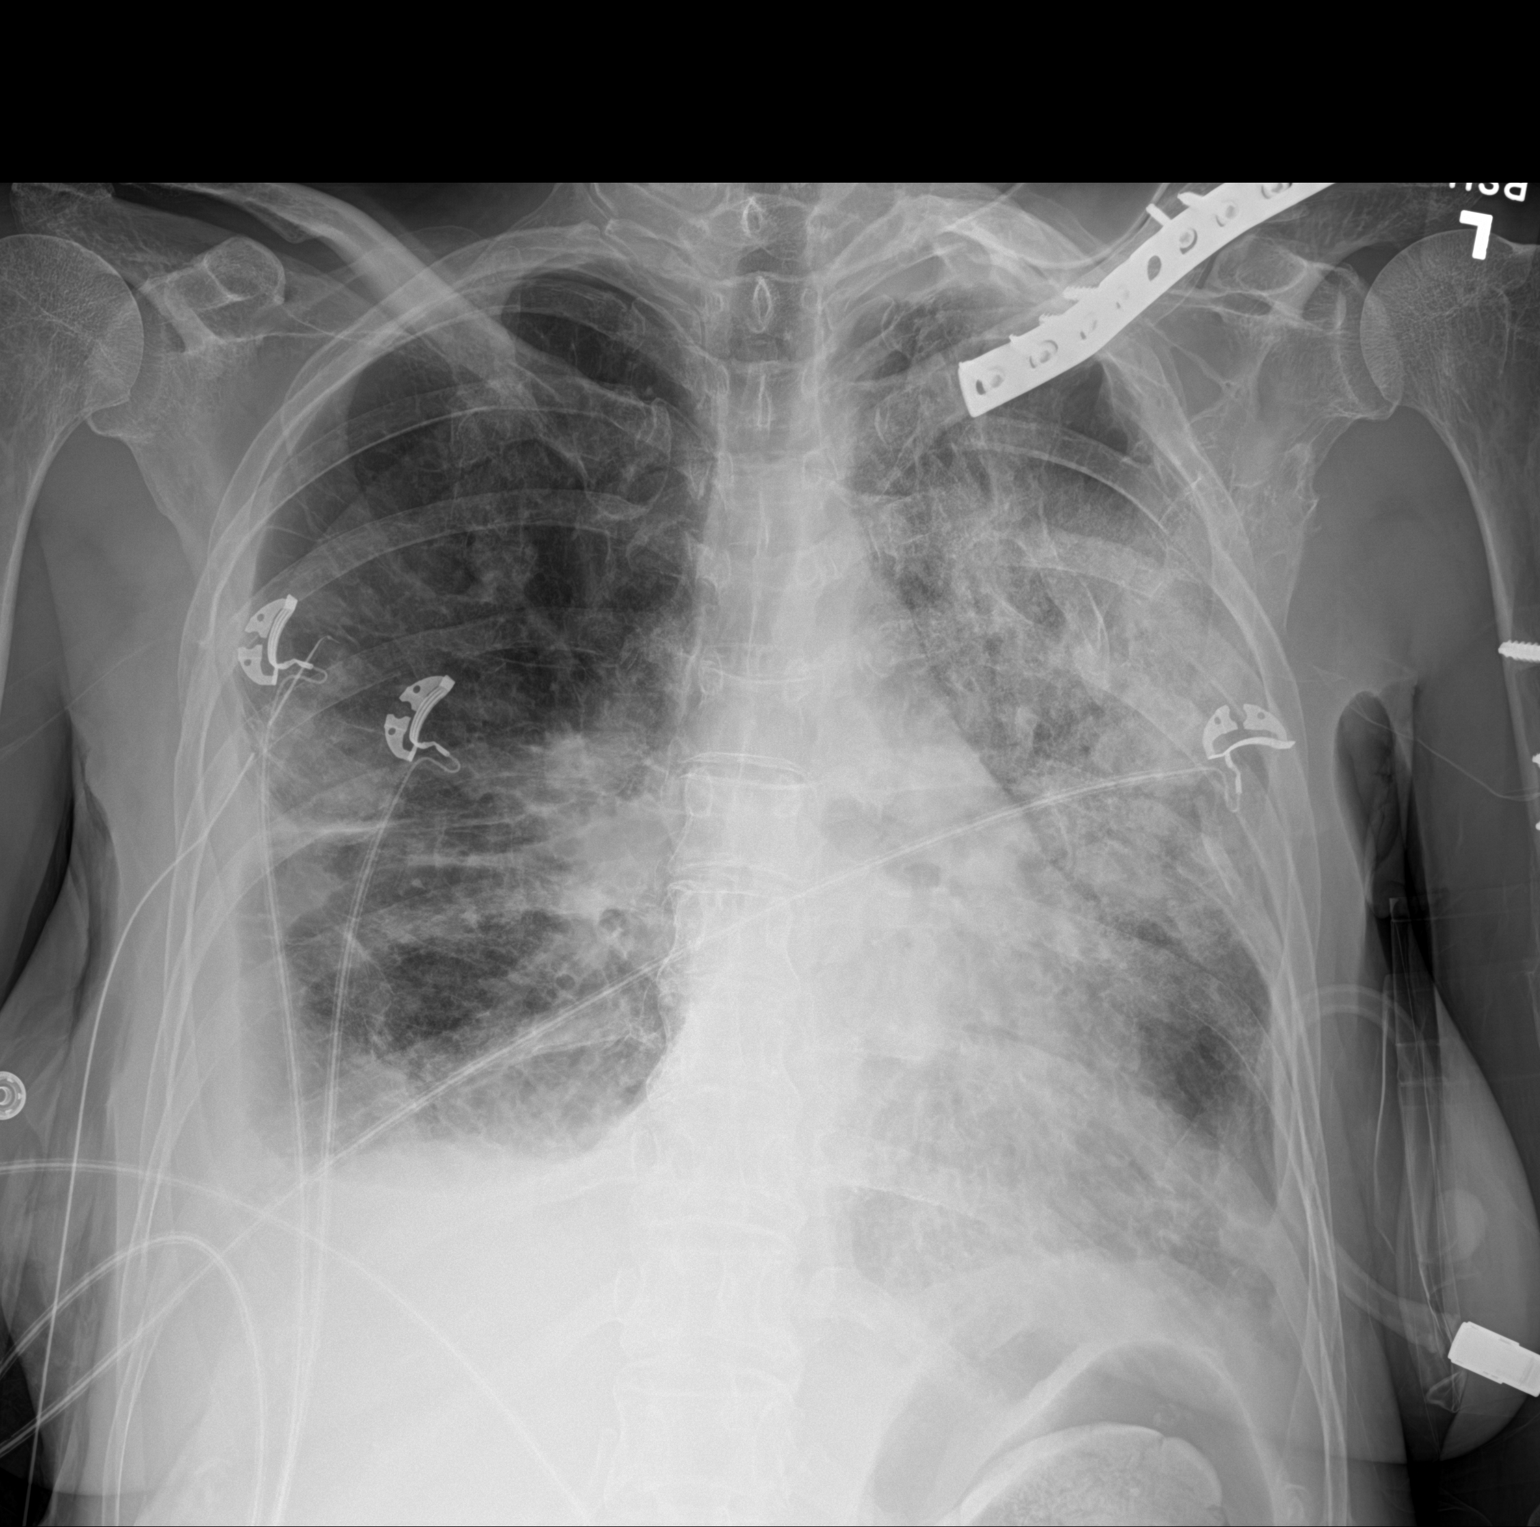

[1 of 1 positions shown; findings below may reference images not displayed]

FINDINGS: Right chest tube remains in place. No pneumothorax. Skin fold again
demonstrated. Small amount of right pleural fluid persists.
Persistent pneumonia more extensive in the left lung than the right,
with slightly improved aeration. No worsening or new finding.
IMPRESSION: No visible pneumothorax on the right in the frontal projection.
Small amount of pleural fluid persists.

Bilateral pulmonary infiltrates more extensive on the left than the
right, but with slightly improved aeration since yesterday.

## 2021-02-04 MED ORDER — FLUOXETINE HCL 20 MG PO CAPS
20.0000 mg | ORAL_CAPSULE | Freq: Every day | ORAL | Status: DC
  Administered 2021-02-04 – 2021-02-18 (×15): 20 mg via ORAL
  Filled 2021-02-04 (×15): qty 1

## 2021-02-04 MED ORDER — SENNOSIDES-DOCUSATE SODIUM 8.6-50 MG PO TABS
1.0000 | ORAL_TABLET | Freq: Every day | ORAL | Status: DC
  Administered 2021-02-04 – 2021-02-18 (×15): 1 via ORAL
  Filled 2021-02-04 (×15): qty 1

## 2021-02-04 MED ORDER — POLYETHYLENE GLYCOL 3350 17 G PO PACK
17.0000 g | PACK | Freq: Every day | ORAL | Status: DC
  Administered 2021-02-04 – 2021-02-18 (×14): 17 g via ORAL
  Filled 2021-02-04 (×15): qty 1

## 2021-02-04 NOTE — Plan of Care (Signed)

## 2021-02-04 NOTE — Progress Notes (Signed)
NAME:  Margaret [name], MRN:  [id number], DOB:  12/07/1970, LOS: 5 ADMISSION DATE:  01/29/2021, CONSULTATION DATE:  10/30 REFERRING MD:  Roderic Palau, REASON FOR CONSULT: pleural effusion  History of Present Illness:  Margaret [name] is a 50 y.o. female who is seen in consultation at the request of Dr. Roderic Palau for recommendations on further evaluation and management of pleural effusion.   Margaret [name], is a 50 y.o. female, who presented to the [facility] on 10/29 with a chief complaint of difficulty with ambulation  They have a pertinent past medical history of ovarian cancer, hep B carrier, intelectual disability, TB (childhood), currently lives in group home.  ED course was notable for workup for a worsening cough which had been present for two days, CT chest demonstrated a large partially loculated right pleural effusion with atelectasis of the right upper lobe. She was also found to have a temp of 100.9, WBC 31.9, HBG 9.3.  She was admitted to the hopsitalist service and treatment was initiated for CAP. She was initially started on doxy but is now on Ceftriaxone and azithromycin   Appears that the patient was accepted to [facility] service.   Pertinent  Medical History  Ovarian mass,  hep B carrier, intelectual disability, TB (childhood), currently lives in group home  [place] [facility] Events: Including procedures, antibiotic start and stop dates in addition to other pertinent events   10/29 Admit to [facility] CT chest shows R pleural effusion. 10/30-31 TXR to [facility] 11/1 -placement of right fifth intercostal space 20 French chest tube  Interim History / Subjective:   Decreasing O2 requirements.  Chest tube continues to drain.  Still moderate pain at insertion site. Agitated, removing IV's Objective   Blood pressure (!) 104/56, pulse (!) 109, temperature 98.4 F (36.9 C), temperature source Oral, resp. rate (!) 37, height 5' 4"  (1.626 m), weight 50.8 kg, SpO2 94 %.        Intake/Output Summary (Last  24 hours) at 02/04/2021 0827 Last data filed at 02/04/2021 0629 Gross per 24 hour  Intake 2880.72 ml  Output 1645 ml  Net 1235.72 ml    Filed Weights   02/01/21 2245 02/02/21 0400 02/04/21 0543  Weight: 52.3 kg 52.2 kg 50.8 kg    Examination: General appearance: In no distress.  Awake. HENT: No scleral icterus.  Oral mucosa intact Neck: Trachea midline; FROM, supple, lymphadenopathy, no JVD Lungs: Right chest tube in place draining serosanguineous turbid fluid.  Breath sounds are now heard throughout the right lung with no crackles.  Left lung is now clear.  CV: RRR, S1, S2, no MRGs  Abdomen: Soft NT ND  Neuro: AAOX2 no focal deficits. Akathesia both upper extremities.   Ancillary tests  Fluid analysis consistent with exudate per Light's criteria Chest x-ray today personally reviewed and shows significant improvement in right pleural effusion.  Left lung consolidation also improving. Improving leukocytosis now 26.1 No malignant cells seen on cytology.   Assessment & Plan:   Acute hypoxic respiratory insufficiency requiring nonrebreather due to large right-sided pleural effusion, loculated  Elevated CA19-9 Recent CAP Right adrenal mass Schizophrenia, controlled  Hypokalemia Hep B Normocytic Anemia Malnutrition-unspecified  Plan:  -Stop intrapleural fibrinolysis. Continue drainage on suction. Current chest tube may eventually migrate out and patient would require new posterior chest tube.  -Continue to wean O2 to keep saturation greater than 92%.  Suspect aspiration event left lung.  Can go to PCU once oxygen requirements less than 6L/min. -Complete 7-day course ceftriaxone. -Elevated CA 19-9  raised concern for ovarian malignancy.  Cystic lesion present but no evidence of intraperitoneal spread.  Pleural metastases without intra-abdominal disease would seem unusual. -Continue usual home medications.  Attempt to wean precedex and began ambulation.   Best Practice (right  click and "Reselect all SmartList Selections" daily)   Best Practice (right click and "Reselect all SmartList Selections" daily)   Diet/type: Regular consistency (see orders) DVT prophylaxis: prophylactic heparin  GI prophylaxis: N/A Lines: N/A Foley:  N/A Code Status:  full code Last date of multidisciplinary goals of care discussion [Will need to update guardian once CT performed]  Labs   CBC: Recent Labs  Lab 01/31/21 0457 02/01/21 0524 02/01/21 2146 02/02/21 0420 02/02/21 0627 02/03/21 0133 02/04/21 0253  WBC 38.9* 34.1* 38.4*  --   --  27.3* 26.1*  NEUTROABS 34.6*  --   --   --   --   --  20.5*  HGB 9.0* 7.8* 8.6* 8.8* 9.5* 8.3* 7.6*  HCT 28.1* 23.7* 26.9* 26.0* 28.0* 26.5* 24.0*  MCV 87.5 87.1 85.1  --   --  85.8 85.4  PLT 372 329 349  --   --  353 818    Basic Metabolic Panel: Recent Labs  Lab 01/31/21 0457 02/01/21 0524 02/01/21 2146 02/02/21 0420 02/02/21 0426 02/02/21 0627 02/03/21 0133 02/04/21 0253  NA 137 132* 134* 135 133* 134* 131* 133*  K 3.5 3.0* 3.3* 3.7 4.3 3.6 3.4* 3.8  CL 104 100 100  --  101  --  94* 102  CO2 25 24 24   --  24  --  28 26  GLUCOSE 100* 189* 159*  --  108*  --  141* 135*  BUN 7 7 5*  --  5*  --  7 6  CREATININE 0.49 0.57 0.64  --  0.60  --  0.52 0.47  CALCIUM 7.8* 7.4* 7.6*  --  8.0*  --  7.6* 7.7*  MG 1.9  --   --   --   --   --   --   --     GFR: Estimated Creatinine Clearance: 69 mL/min (by C-G formula based on SCr of 0.47 mg/dL). Recent Labs  Lab 01/30/21 0325 01/30/21 0557 01/31/21 0457 02/01/21 0524 02/01/21 2146 02/02/21 0101 02/03/21 0133 02/04/21 0253  PROCALCITON  --   --   --   --  0.65  --   --   --   WBC  --   --    < > 34.1* 38.4*  --  27.3* 26.1*  LATICACIDVEN 0.7 0.9  --   --  1.5 1.1  --   --    < > = values in this interval not displayed.     Liver Function Tests: Recent Labs  Lab 01/30/21 0016 01/31/21 0457 02/01/21 0524 02/01/21 2146  AST 19 21 21 28   ALT 22 22 20 25   ALKPHOS 87 88  78 94  BILITOT 0.3 0.4 0.4 0.4  PROT 5.6* 5.0* 4.6* 5.3*  ALBUMIN 2.1* 1.8* 1.5* 1.5*    Recent Labs  Lab 01/30/21 0016  LIPASE 26    No results for input(s): AMMONIA in the last 168 hours.  ABG    Component Value Date/Time   PHART 7.475 (H) 02/02/2021 0627   PCO2ART 42.5 02/02/2021 0627   PO2ART 65 (L) 02/02/2021 0627   HCO3 31.3 (H) 02/02/2021 0627   TCO2 33 (H) 02/02/2021 0627   O2SAT 94.0 02/02/2021 0627     Coagulation Profile:  No results for input(s): INR, PROTIME in the last 168 hours.  Cardiac Enzymes: No results for input(s): CKTOTAL, CKMB, CKMBINDEX, TROPONINI in the last 168 hours.  HbA1C: No results found for: HGBA1C  CBG: Recent Labs  Lab 02/01/21 [place] Minka Knight, MD [facility] ICU Physician [place]  Pager: [phone] Mobile: [phone] After hours: [phone].

## 2021-02-05 ENCOUNTER — Inpatient Hospital Stay (HOSPITAL_COMMUNITY): Payer: Medicare Other

## 2021-02-05 DIAGNOSIS — J9601 Acute respiratory failure with hypoxia: Secondary | ICD-10-CM | POA: Diagnosis not present

## 2021-02-05 LAB — BASIC METABOLIC PANEL
Anion gap: 4 — ABNORMAL LOW (ref 5–15)
BUN: 6 mg/dL (ref 6–20)
CO2: 28 mmol/L (ref 22–32)
Calcium: 8.1 mg/dL — ABNORMAL LOW (ref 8.9–10.3)
Chloride: 104 mmol/L (ref 98–111)
Creatinine, Ser: 0.52 mg/dL (ref 0.44–1.00)
GFR, Estimated: >60 mL/min (ref >60)
Glucose, Bld: 125 mg/dL — ABNORMAL HIGH (ref 70–99)
Potassium: 3.7 mmol/L (ref 3.5–5.1)
Sodium: 136 mmol/L (ref 135–145)

## 2021-02-05 LAB — CBC WITH DIFFERENTIAL/PLATELET
Abs Immature Granulocytes: 0 10*3/uL (ref 0.00–0.07)
Basophils Absolute: 0.5 10*3/uL — ABNORMAL HIGH (ref 0.0–0.1)
Basophils Relative: 2 %
Eosinophils Absolute: 0.3 10*3/uL (ref 0.0–0.5)
Eosinophils Relative: 1 %
HCT: 24.7 % — ABNORMAL LOW (ref 36.0–46.0)
Hemoglobin: 7.6 g/dL — ABNORMAL LOW (ref 12.0–15.0)
Lymphocytes Relative: 3 %
Lymphs Abs: 0.8 10*3/uL (ref 0.7–4.0)
MCH: 26.6 pg (ref 26.0–34.0)
MCHC: 30.8 g/dL (ref 30.0–36.0)
MCV: 86.4 fL (ref 80.0–100.0)
Monocytes Absolute: 0.8 10*3/uL (ref 0.1–1.0)
Monocytes Relative: 3 %
Neutro Abs: 22.8 10*3/uL — ABNORMAL HIGH (ref 1.7–7.7)
Neutrophils Relative %: 91 %
Platelets: 322 10*3/uL (ref 150–400)
RBC: 2.86 MIL/uL — ABNORMAL LOW (ref 3.87–5.11)
RDW: 13.7 % (ref 11.5–15.5)
WBC: 25.1 10*3/uL — ABNORMAL HIGH (ref 4.0–10.5)
nRBC: 0 % (ref 0.0–0.2)
nRBC: 0 /100 WBC

## 2021-02-05 LAB — BODY FLUID CULTURE W GRAM STAIN: Culture: NO GROWTH

## 2021-02-05 IMAGING — CT CT CHEST W/O CM
2 of 4 series · 15 of 36 positions shown, 18 images · non-contrast
Comparison: [DATE]

CLINICAL DATA: Empyema, pneumonia

EXAM:
CT CHEST WITHOUT CONTRAST
TECHNIQUE: Multidetector CT imaging of the chest was performed following the
standard protocol without IV contrast.

[Series 4: thorax 2.0 · axial · 0.61mm/px · z∈[-478,-212]mm · 12 of 149 slices shown, 15 images]
[im 8/149  mediastinal]
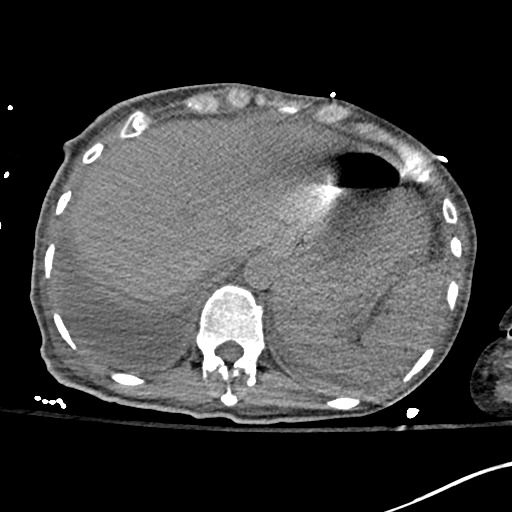
[im 8/149  lung]
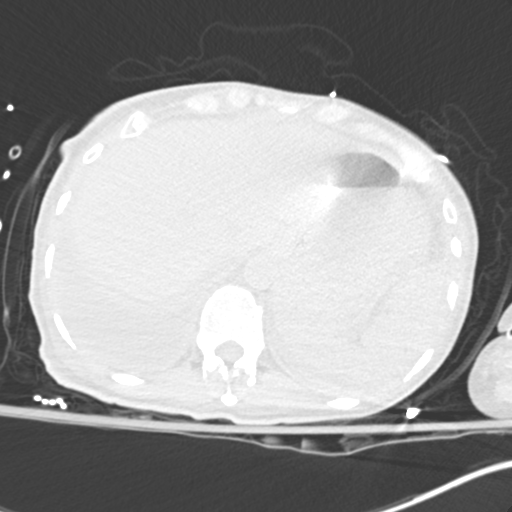
[im 24/149  lung]
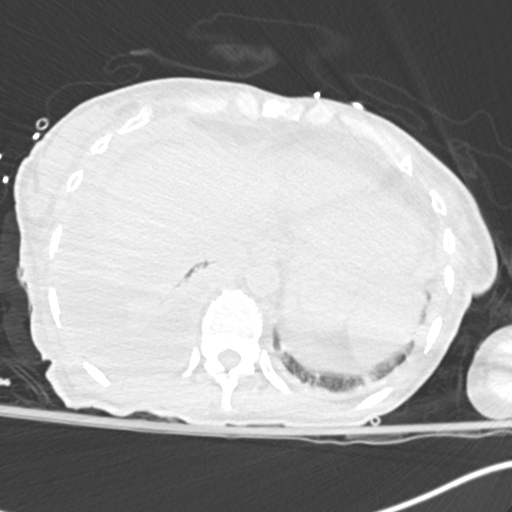
[im 32/149  lung]
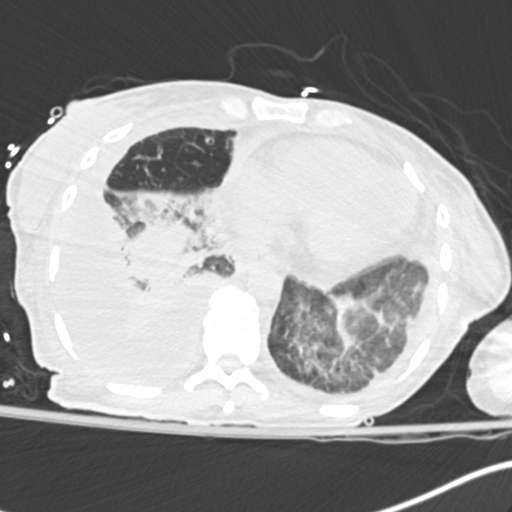
[im 47/149  lung]
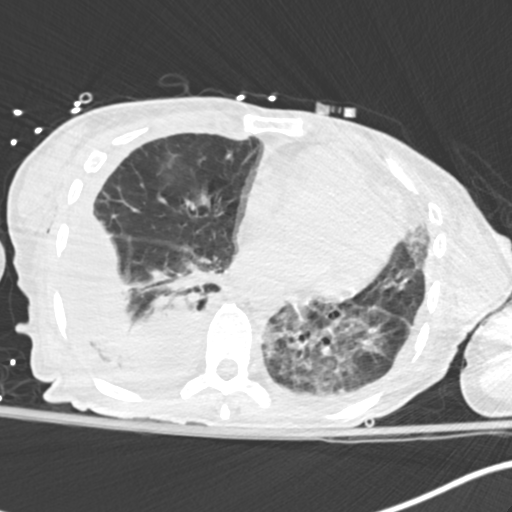
[im 55/149  mediastinal]
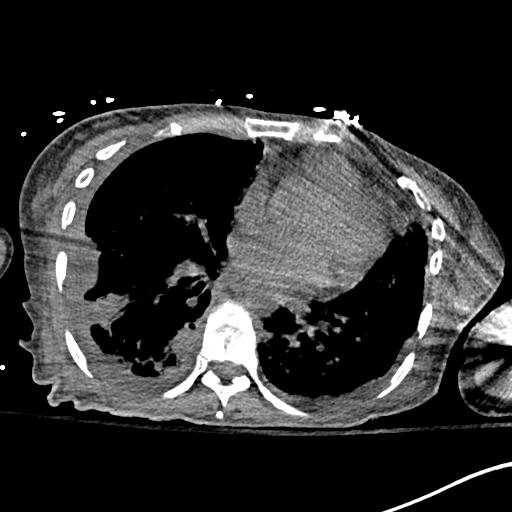
[im 55/149  lung]
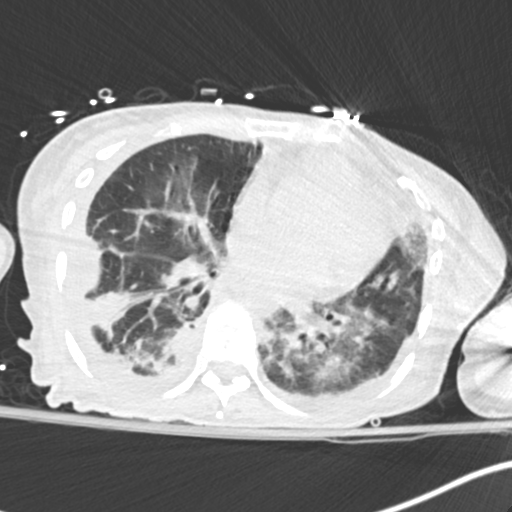
[im 71/149  lung]
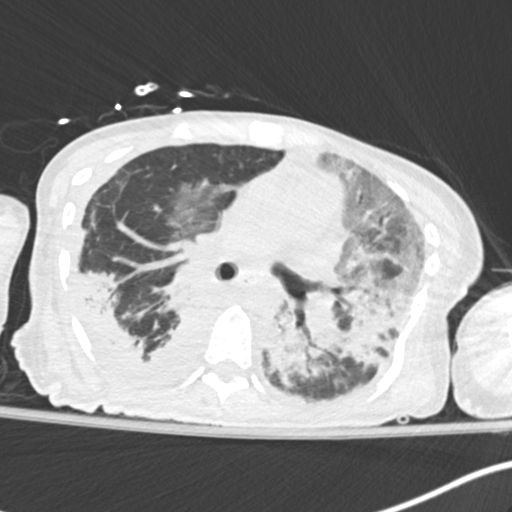
[im 78/149  lung]
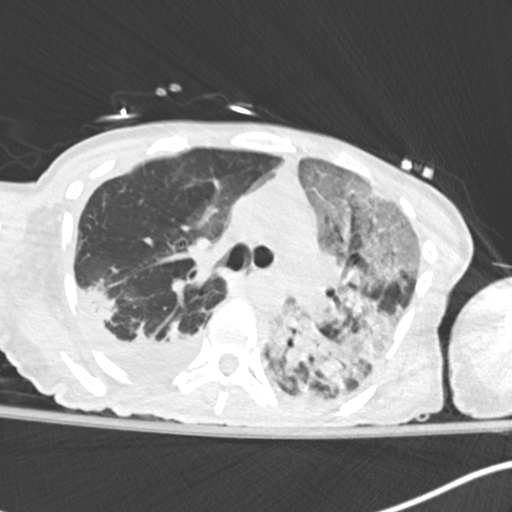
[im 94/149  lung]
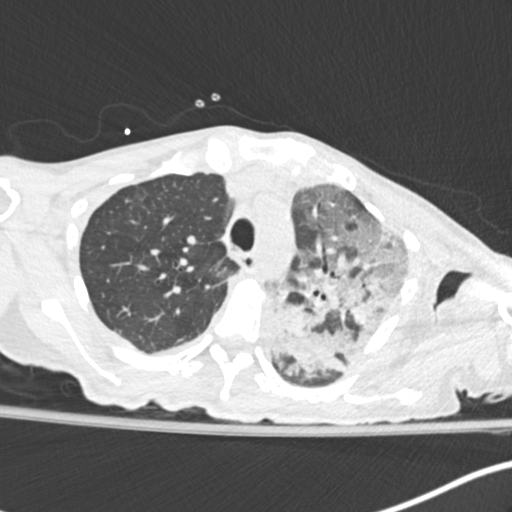
[im 102/149  mediastinal]
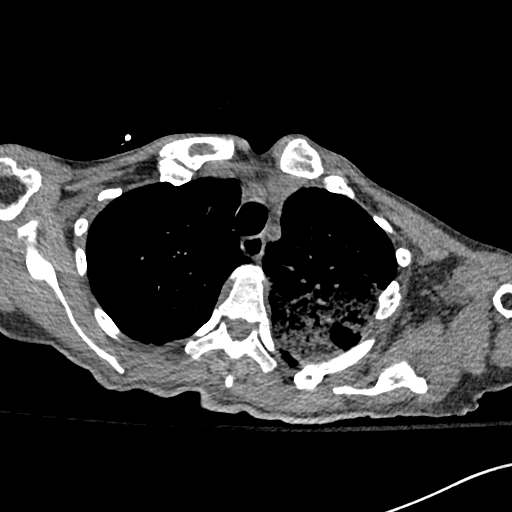
[im 102/149  lung]
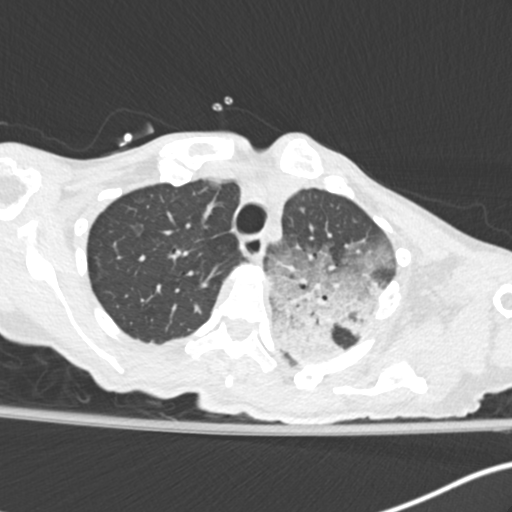
[im 117/149  lung]
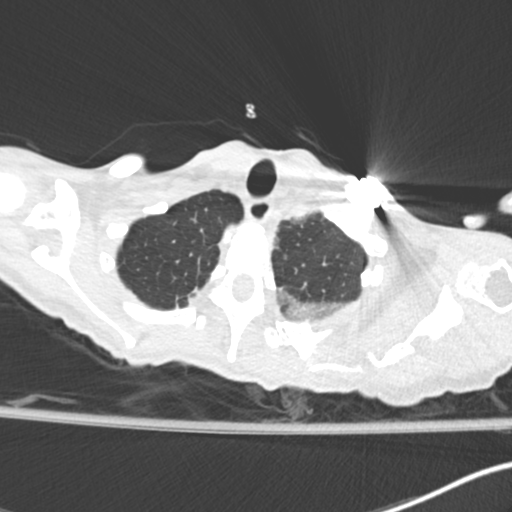
[im 125/149  lung]
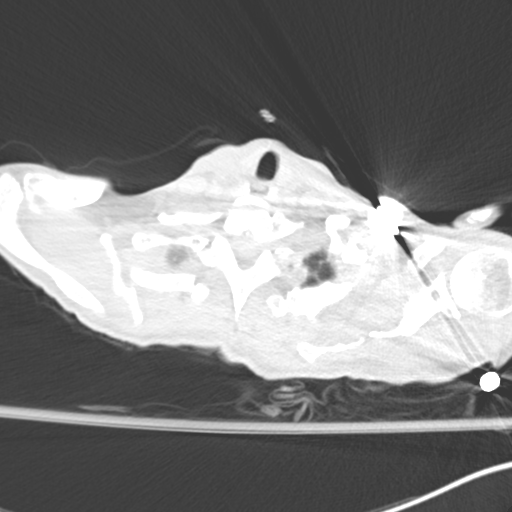
[im 141/149  lung]
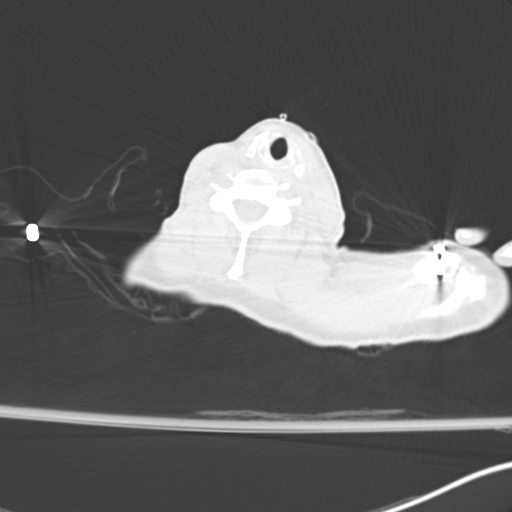

[Series 8: coronal · coronal · 0.61mm/px · 3 of 68 slices shown]
[im 14/68  lung]
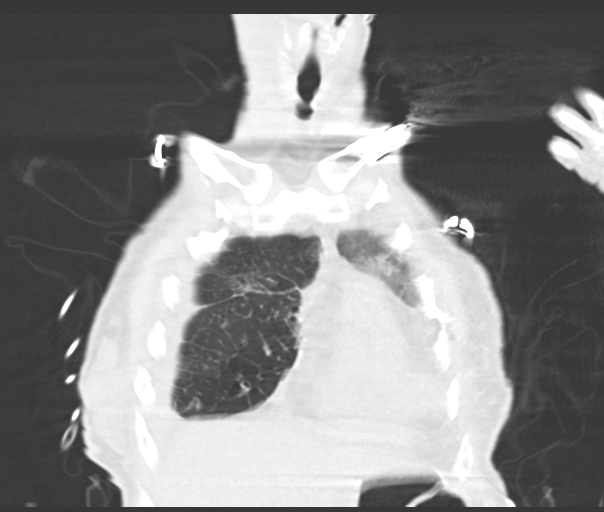
[im 27/68  lung]
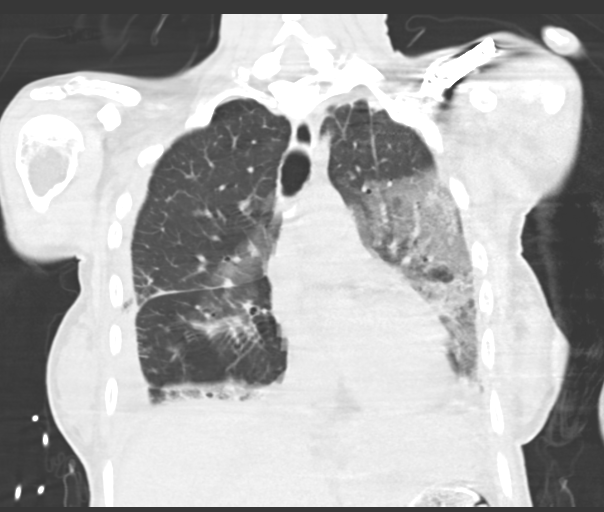
[im 41/68  lung]
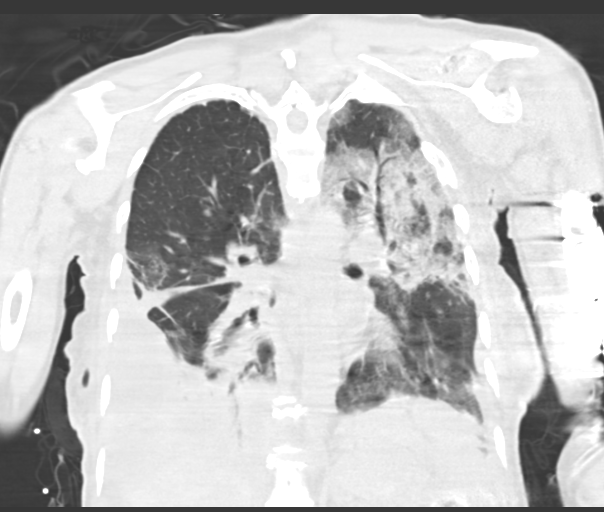

[15 of 36 positions shown; findings below may reference images not displayed]

FINDINGS: Cardiovascular: Scattered coronary artery calcifications are seen.
Evaluation is limited without intravenous contrast.

Mediastinum/Nodes: No new significant lymphadenopathy seen.
Evaluation is limited without intravenous contrast.

Lungs/Pleura: There is interval decrease in loculated effusion in
the right mid lung fields. There is interval removal of right chest
tube. There are few tiny pockets of air in the anterolateral aspect
of the pleural space in right mid lung fields. There are pockets of
air in the previously removed right chest tube track in the chest
wall. Large ground-glass and alveolar infiltrates are seen in the
left lung. There is interval worsening of ground-glass infiltrate in
left upper lobe. There is decrease in the infiltrates in left lower
lobe. There are patchy ground-glass and infiltrates in right lung,
with no significant interval change. There is interval decrease in
amount of loculated effusion in the periphery of right upper lung
fields. There is moderate to large right pleural effusion in the
periphery of right lower lung fields. There are patchy infiltrates
in right lower lobe.

Upper Abdomen: Unremarkable

Musculoskeletal: Unremarkable
IMPRESSION: There is interval decrease in loculated effusion in the periphery of
right lung. There is interval removal of right chest tube. Moderate
to large right pleural effusion is seen. Small left pleural effusion
is seen.

There is interval worsening of ground-glass infiltrates in the left
upper lobe. There is decrease in infiltrates in the left lower lobe.
Infiltrates in the right lung have not changed significantly.

There is 1.3 cm nodular density in the left lower lobe with
spiculated margins. This may be part of resolving pneumonia or
underlying parenchymal nodule such as neoplasm. Repeat CT after
medical management of pneumonia should be considered.

## 2021-02-05 MED ORDER — AMOXICILLIN-POT CLAVULANATE 875-125 MG PO TABS
1.0000 | ORAL_TABLET | Freq: Two times a day (BID) | ORAL | Status: DC
  Administered 2021-02-05 – 2021-02-18 (×27): 1 via ORAL
  Filled 2021-02-05 (×28): qty 1

## 2021-02-05 MED ORDER — ENSURE ENLIVE PO LIQD
237.0000 mL | Freq: Three times a day (TID) | ORAL | Status: DC
  Administered 2021-02-05 – 2021-02-18 (×27): 237 mL via ORAL

## 2021-02-05 MED ORDER — CHLORPROMAZINE HCL 25 MG PO TABS
50.0000 mg | ORAL_TABLET | Freq: Two times a day (BID) | ORAL | Status: DC
  Administered 2021-02-05 – 2021-02-17 (×26): 50 mg via ORAL
  Filled 2021-02-05 (×29): qty 2

## 2021-02-05 NOTE — Consult Note (Signed)
Reason for Consult:right empyema Referring Physician: Dr. Ina Homes  Margaret [name] is an 50 y.o. female.  HPI: Ms. Orzechowski is a 50 year old woman with a history of ovarian cancer, schizophrenia, electrical disability, impulse disorder, tuberculosis, hepatitis B, and hypothyroidism.  She currently lives in a group home.  She presented with difficulty with ambulation.  In the emergency room she had a cough and fever of 100.9.  Her white blood cell count was elevated at 32,000.  CT of the chest showed a large loculated right pleural effusion.  She was started on antibiotics with doxycycline and then changed to ceftriaxone and azithromycin.  She had a chest tube placed on 02/02/2021.  She drained 900 mL of fluid on the first day and then 400 the next day and then about 100 the following day.  She had a decrease in her oxygen requirement and her chest x-ray showed improvement.  Cultures from the fluid with no growth.  She had a repeat CT today which shows a posterior basilar loculated effusion.  She had interval worsening of groundglass infiltrates in the left upper lobe with a decrease of infiltrates in the left lower lobe.  She said she had pain but then was unable to tell me where.  She is minimally cooperative with questions and exam.  Past Medical History:  Diagnosis Date   Hematuria 11/12/2014   Hepatitis B carrier ([place])    Hypothyroidism    Impulse disorder    IUD (intrauterine device) in place 11/12/2014   Inserted 12/24/12 at [facility]   Mild intellectual disability    Schizophrenia ([place])    Schizophrenia ([place])    Tuberculosis    as a child   Urinary frequency 11/12/2014    Past Surgical History:  Procedure Laterality Date   ARM WOUND REPAIR / CLOSURE     TRACHEAL SURGERY     TRACHEOSTOMY      Family History  Adopted: Yes  Family history unknown: Yes    Social History:  reports that she has never smoked. She has never used smokeless tobacco. She reports that she does not drink  alcohol and does not use drugs.  Allergies: No Known Allergies  Medications: Scheduled Meds:  acetaminophen  1,000 mg Oral Once   amoxicillin-clavulanate  1 tablet Oral Q12H   benztropine  1 mg Oral BID   Chlorhexidine Gluconate Cloth  6 each Topical Daily   chlorproMAZINE  50 mg Oral BID   cloZAPine  250 mg Oral QHS   entecavir  0.5 mg Oral Daily   feeding supplement  237 mL Oral TID BM   FLUoxetine  20 mg Oral Daily   heparin  5,000 Units Subcutaneous Q8H   mirtazapine  7.5 mg Oral QHS   polyethylene glycol  17 g Oral Daily   senna-docusate  1 tablet Oral Daily   sodium chloride flush  10 mL Other Q8H   Continuous Infusions:  dexmedetomidine (PRECEDEX) IV infusion Stopped (02/05/21 1114)   PRN Meds:.acetaminophen, acetaminophen, albuterol, fentaNYL (SUBLIMAZE) injection, hydrOXYzine, morphine injection   Results for orders placed or performed during the hospital encounter of 01/29/21 (from the past 48 hour(s))  Basic metabolic panel     Status: Abnormal   Collection Time: 02/04/21  2:53 AM  Result Value Ref Range   Sodium 133 (L) 135 - 145 mmol/L   Potassium 3.8 3.5 - 5.1 mmol/L   Chloride 102 98 - 111 mmol/L   CO2 26 22 - 32 mmol/L   Glucose, Bld 135 (H)  70 - 99 mg/dL    Comment: Glucose reference range applies only to samples taken after fasting for at least 8 hours.   BUN 6 6 - 20 mg/dL   Creatinine, Ser 0.47 0.44 - 1.00 mg/dL   Calcium 7.7 (L) 8.9 - 10.3 mg/dL   GFR, Estimated >60 >60 mL/min    Comment: (NOTE) Calculated using the CKD-EPI Creatinine Equation (2021)    Anion gap 5 5 - 15    Comment: Performed at [facility] [address] [place], [place] [address]  CBC with Differential/Platelet     Status: Abnormal   Collection Time: 02/04/21  2:53 AM  Result Value Ref Range   WBC 26.1 (H) 4.0 - 10.5 K/uL   RBC 2.81 (L) 3.87 - 5.11 MIL/uL   Hemoglobin 7.6 (L) 12.0 - 15.0 g/dL   HCT 24.0 (L) 36.0 - 46.0 %   MCV 85.4 80.0 - 100.0 fL   MCH 27.0 26.0 -  34.0 pg   MCHC 31.7 30.0 - 36.0 g/dL   RDW 13.7 11.5 - 15.5 %   Platelets 336 150 - 400 K/uL   nRBC 0.0 0.0 - 0.2 %   Neutrophils Relative % 78 %   Neutro Abs 20.5 (H) 1.7 - 7.7 K/uL   Lymphocytes Relative 14 %   Lymphs Abs 3.6 0.7 - 4.0 K/uL   Monocytes Relative 5 %   Monocytes Absolute 1.3 (H) 0.1 - 1.0 K/uL   Eosinophils Relative 1 %   Eosinophils Absolute 0.3 0.0 - 0.5 K/uL   Basophils Relative 0 %   Basophils Absolute 0.0 0.0 - 0.1 K/uL   Immature Granulocytes 2 %   Abs Immature Granulocytes 0.42 (H) 0.00 - 0.07 K/uL    Comment: Performed at [facility] [address], [place], [place] [address]  CBC with Differential/Platelet     Status: Abnormal   Collection Time: 02/05/21  5:52 AM  Result Value Ref Range   WBC 25.1 (H) 4.0 - 10.5 K/uL   RBC 2.86 (L) 3.87 - 5.11 MIL/uL   Hemoglobin 7.6 (L) 12.0 - 15.0 g/dL   HCT 24.7 (L) 36.0 - 46.0 %   MCV 86.4 80.0 - 100.0 fL   MCH 26.6 26.0 - 34.0 pg   MCHC 30.8 30.0 - 36.0 g/dL   RDW 13.7 11.5 - 15.5 %   Platelets 322 150 - 400 K/uL   nRBC 0.0 0.0 - 0.2 %   Neutrophils Relative % 91 %   Neutro Abs 22.8 (H) 1.7 - 7.7 K/uL   Lymphocytes Relative 3 %   Lymphs Abs 0.8 0.7 - 4.0 K/uL   Monocytes Relative 3 %   Monocytes Absolute 0.8 0.1 - 1.0 K/uL   Eosinophils Relative 1 %   Eosinophils Absolute 0.3 0.0 - 0.5 K/uL   Basophils Relative 2 %   Basophils Absolute 0.5 (H) 0.0 - 0.1 K/uL   nRBC 0 0 /100 WBC   Abs Immature Granulocytes 0.00 0.00 - 0.07 K/uL    Comment: Performed at [facility], [place] [address], [place], [place] [address]  Basic metabolic panel     Status: Abnormal   Collection Time: 02/05/21  5:52 AM  Result Value Ref Range   Sodium 136 135 - 145 mmol/L   Potassium 3.7 3.5 - 5.1 mmol/L   Chloride 104 98 - 111 mmol/L   CO2 28 22 - 32 mmol/L   Glucose, Bld 125 (H) 70 - 99 mg/dL  Comment: Glucose reference range applies only to samples taken after fasting for at least 8 hours.   BUN 6 6 - 20 mg/dL    Creatinine, Ser 0.52 0.44 - 1.00 mg/dL   Calcium 8.1 (L) 8.9 - 10.3 mg/dL   GFR, Estimated >60 >60 mL/min    Comment: (NOTE) Calculated using the CKD-EPI Creatinine Equation (2021)    Anion gap 4 (L) 5 - 15    Comment: Performed at [facility] [address] [place], [place] [address]    CT CHEST WO CONTRAST  Result Date: 02/05/2021 CLINICAL DATA:  Empyema, pneumonia EXAM: CT CHEST WITHOUT CONTRAST TECHNIQUE: Multidetector CT imaging of the chest was performed following the standard protocol without IV contrast. COMPARISON:  02/02/2021 FINDINGS: Cardiovascular: Scattered coronary artery calcifications are seen. Evaluation is limited without intravenous contrast. Mediastinum/Nodes: No new significant lymphadenopathy seen. Evaluation is limited without intravenous contrast. Lungs/Pleura: There is interval decrease in loculated effusion in the right mid lung fields. There is interval removal of right chest tube. There are few tiny pockets of air in the anterolateral aspect of the pleural space in right mid lung fields. There are pockets of air in the previously removed right chest tube track in the chest wall. Large ground-glass and alveolar infiltrates are seen in the left lung. There is interval worsening of ground-glass infiltrate in left upper lobe. There is decrease in the infiltrates in left lower lobe. There are patchy ground-glass and infiltrates in right lung, with no significant interval change. There is interval decrease in amount of loculated effusion in the periphery of right upper lung fields. There is moderate to large right pleural effusion in the periphery of right lower lung fields. There are patchy infiltrates in right lower lobe. Upper Abdomen: Unremarkable Musculoskeletal: Unremarkable IMPRESSION: There is interval decrease in loculated effusion in the periphery of right lung. There is interval removal of right chest tube. Moderate to large right pleural effusion is seen. Small  left pleural effusion is seen. There is interval worsening of ground-glass infiltrates in the left upper lobe. There is decrease in infiltrates in the left lower lobe. Infiltrates in the right lung have not changed significantly. There is 1.3 cm nodular density in the left lower lobe with spiculated margins. This may be part of resolving pneumonia or underlying parenchymal nodule such as neoplasm. Repeat CT after medical management of pneumonia should be considered. Electronically Signed   By: Elmer Picker M.D.   On: 02/05/2021 13:16   DG CHEST PORT 1 VIEW  Result Date: 02/04/2021 CLINICAL DATA:  Follow-up chest tube.  Confusion. EXAM: PORTABLE CHEST 1 VIEW COMPARISON:  02/03/2021 FINDINGS: Right chest tube remains in place. No pneumothorax. Skin fold again demonstrated. Small amount of right pleural fluid persists. Persistent pneumonia more extensive in the left lung than the right, with slightly improved aeration. No worsening or new finding. IMPRESSION: No visible pneumothorax on the right in the frontal projection. Small amount of pleural fluid persists. Bilateral pulmonary infiltrates more extensive on the left than the right, but with slightly improved aeration since yesterday. Electronically Signed   By: Nelson Chimes M.D.   On: 02/04/2021 08:34    I personally reviewed the CT chest.  There is a loculated basilar effusion on the right.  Severe airspace disease on the left   Review of Systems Blood pressure (!) 91/52, pulse (!) 114, temperature 97.8 F (36.6 C), resp. rate (!) 38, height 5' 4"  (1.626 m), weight 50.4 kg, SpO2 99 %.  Physical Exam Vitals reviewed.  Constitutional:      General: She is not in acute distress. HENT:     Head: Normocephalic and atraumatic.  Eyes:     Extraocular Movements: Extraocular movements intact.  Cardiovascular:     Rate and Rhythm: Regular rhythm. Tachycardia present.  Pulmonary:     Comments: Absent breath sounds right base Abdominal:      General: There is no distension.     Palpations: Abdomen is soft.  Musculoskeletal:     Cervical back: Neck supple.  Lymphadenopathy:     Cervical: No cervical adenopathy.  Neurological:     General: No focal deficit present.     Mental Status: She is alert. She is disoriented.     Comments: In restraints due to lack of impulse control    Assessment/Plan: Margaret [name] is a 50 year old woman with a history of ovarian cancer, schizophrenia, electrical disability, impulse disorder, tuberculosis, hepatitis B, and hypothyroidism.  She was admitted with community-acquired pneumonia with a parapneumonic effusion.  She had a chest tube placed a few days ago with drainage of the anterior and superior component of the effusion.  The basilar component was not drained.  In addition to the findings on the right she has severe airspace disease on the left side.  I do not think she would be able to tolerate single lung ventilation.  I do not think she is a candidate for surgical decortication.  Options are to leave the fluid abscess or to place a percutaneous drain and then give thrombolytics.  I think a trial of drainage and thrombolytics would be a reasonable option.  Will defer to CCM.  Please call if we can be of any further assistance.  Melrose Nakayama 02/05/2021, 4:11 PM

## 2021-02-05 NOTE — Progress Notes (Signed)
Still a bit agitated; case d/w Dr. Elita Quick, poor surgical candidate. If WBC remains elevated tomorrow, may try a posterior pigtail with TPA x 3 and call it as treated as possible.  Erskine Emery MD PCCM

## 2021-02-05 NOTE — Progress Notes (Signed)
Repeat US showing thick pleural rind with little free flowing fluid.  Will get CT and review case with [facility].  Erskine Emery MD PCCM

## 2021-02-05 NOTE — Progress Notes (Signed)
Nutrition Follow-up  DOCUMENTATION CODES:   Underweight  INTERVENTION:   Ensure Enlive po increase to TID, each supplement provides 350 kcal and 20 grams of protein  Magic cup BID with meals, each supplement provides 290 kcal and 9 grams of protein  Liberalize diet to Regular  Feeding assistance at meal times as needed  No BM since 10/30; recommend adjusting bowel regimen  NUTRITION DIAGNOSIS:   Inadequate oral intake related to poor appetite, social / environmental circumstances (patient lives in a group home) as evidenced by per patient/family report, percent weight loss (BMI 18.37 (underweight)).  Being addressed via diet advancement, supplements  GOAL:   Patient will meet greater than or equal to 90% of their needs  Addressing but not met  MONITOR:   PO intake, Supplement acceptance, Weight trends, Labs  REASON FOR ASSESSMENT:   Malnutrition Screening Tool    ASSESSMENT:   Patient is an underweight 50 yo female with hx of schizophrenia who presents with ambulation difficulty. Community acquired pneumonia, large right sided PE (pending diagnostic  thoracentesis), right adnexal mass concerning for malignancy per MD.  10/29 Admit 11/01 Chest tube placed  Remains on precedex but weaning. Pt with 1:1 sitter. Noted pt pulled out foley overnight  Recorded po intake 0-100%, recorded po intake 30% on average. This AM pt only ate a banana this AM. Noted pt did drink most of Ensure shake  Cortrak was ordered to be placed on 11/2 but not placed as diet advanced and pt was more alert, eating per RN Noted plan for SLP evaluation, concern for possible aspiration  Per MD lung scans appear more consistent with aspiration events than cancer  Labs: reviewed Meds: miralax, senokot, remeron  Diet Order:   Diet Order             Diet Heart Room service appropriate? Yes; Fluid consistency: Thin  Diet effective now                   EDUCATION NEEDS:   Education  needs have been addressed  Skin:  Skin Assessment: Reviewed RN Assessment  Last BM:  10/30 type 3  Height:   Ht Readings from Last 1 Encounters:  02/01/21 5' 4"  (1.626 m)    Weight:   Wt Readings from Last 1 Encounters:  02/05/21 50.4 kg     BMI:  Body mass index is 19.07 kg/m.  Estimated Nutritional Needs:   Kcal:  1715-1850  Protein:  69-78 gr  Fluid:  >1500 ml daily   Kerman Passey MS, RDN, LDN, CNSC Registered Dietitian III Clinical Nutrition RD Pager and On-Call Pager Number Located in [place]

## 2021-02-05 NOTE — Evaluation (Signed)
Clinical/Bedside Swallow Evaluation Patient Details  Name: Margaret [name] MRN: [id number] Date of Birth: 1970-08-01  Today's Date: 02/05/2021 Time: SLP Start Time (ACUTE ONLY): 74 SLP Stop Time (ACUTE ONLY): 1145 SLP Time Calculation (min) (ACUTE ONLY): 15 min  Past Medical History:  Past Medical History:  Diagnosis Date   Hematuria 11/12/2014   Hepatitis B carrier ([place])    Hypothyroidism    Impulse disorder    IUD (intrauterine device) in place 11/12/2014   Inserted 12/24/12 at [facility]   Mild intellectual disability    Schizophrenia ([place])    Schizophrenia ([place])    Tuberculosis    as a child   Urinary frequency 11/12/2014   Past Surgical History:  Past Surgical History:  Procedure Laterality Date   ARM WOUND REPAIR / CLOSURE     TRACHEAL SURGERY     TRACHEOSTOMY     HPI:  Pt is a 50 year old female admitted with worsening cough which had been present for two days, CT chest demonstrated a large partially loculated right pleural effusion with atelectasis of the right upper lobe. MD report states "Looking back through her scans, she had highly suspicious lung lesions on Oct 19 scan which regressed on Nov 1 scan.  This would be less consistent with cancer.  The pleural fluid characteristics are c/w empyema and clinical history of migrating infiltrates would be more c/w aspiration events." They have a pertinent past medical history of ovarian cancer, hep B carrier, intelectual disability, TB (childhood), currently lives in group home.    Assessment / Plan / Recommendation  Clinical Impression  Pt seen with lunch meal. SLP provided set up but observed pt self feeding to determine any behavioral aspect that may contribute to aspiration risk. Pt had rapid breathing and coughing after sitting herself up, but during meal she took small bites and sips, ate at an acceptable rate with no coughing. Her respiratory status does put her at increased risk of aspiration, but the concern for pt is more  related to a chronic dysphagia that could cause her pulmonary infection. Pt did not exhibit concern for this type of oropharyngeal dysphagia. However, she did have signs of thrush on her tongue, which can often continue down the esophagus. She was not a good historian but enthusiastically agreed to vomiting with meals. Question if post prandial aspiration or GERD could be occuring. Recommend esophagram or EGD when appropriate. No SLP intervention needed at this time. SLP Visit Diagnosis: Dysphagia, unspecified (R13.10)    Aspiration Risk  Mild aspiration risk    Diet Recommendation Regular;Thin liquid   Liquid Administration via: Cup;Straw Medication Administration: Whole meds with liquid Supervision: Patient able to self feed Compensations: Slow rate;Small sips/bites Postural Changes: Seated upright at 90 degrees    Other  Recommendations      Recommendations for follow up therapy are one component of a multi-disciplinary discharge planning process, led by the attending physician.  Recommendations may be updated based on patient status, additional functional criteria and insurance authorization.  Follow up Recommendations        Frequency and Duration            Prognosis        Swallow Study   General HPI: Pt is a 50 year old female admitted with worsening cough which had been present for two days, CT chest demonstrated a large partially loculated right pleural effusion with atelectasis of the right upper lobe. MD report states "Looking back through her scans, she had highly  suspicious lung lesions on Oct 19 scan which regressed on Nov 1 scan.  This would be less consistent with cancer.  The pleural fluid characteristics are c/w empyema and clinical history of migrating infiltrates would be more c/w aspiration events." They have a pertinent past medical history of ovarian cancer, hep B carrier, intelectual disability, TB (childhood), currently lives in group home. Type of Study:  Bedside Swallow Evaluation Diet Prior to this Study: Regular;Thin liquids Temperature Spikes Noted: No Respiratory Status: Nasal cannula History of Recent Intubation: No Behavior/Cognition: Alert;Cooperative Oral Cavity Assessment: Other (comment) (coated with white) Oral Care Completed by SLP: No Oral Cavity - Dentition: Adequate natural dentition Self-Feeding Abilities: Able to feed self Patient Positioning: Upright in bed Baseline Vocal Quality: Normal Volitional Cough: Strong Volitional Swallow: Able to elicit    Oral/Motor/Sensory Function Overall Oral Motor/Sensory Function: Within functional limits   Ice Chips     Thin Liquid Thin Liquid: Within functional limits Presentation: Cup;Self Fed    Nectar Thick Nectar Thick Liquid: Not tested   Honey Thick Honey Thick Liquid: Not tested   Puree Puree: Within functional limits   Solid     Solid: Within functional limits      Zyaire Dumas, Katherene Ponto 02/05/2021,2:10 PM

## 2021-02-05 NOTE — Progress Notes (Signed)
NAME:  Margaret [name], MRN:  [id number], DOB:  Nov 27, 1970, LOS: 6 ADMISSION DATE:  01/29/2021, CONSULTATION DATE:  10/30 REFERRING MD:  Roderic Palau, REASON FOR CONSULT: pleural effusion  History of Present Illness:  Margaret [name] is a 50 y.o. female who is seen in consultation at the request of Dr. Roderic Palau for recommendations on further evaluation and management of pleural effusion.   Margaret [name], is a 50 y.o. female, who presented to the [facility] ED on 10/29 with a chief complaint of difficulty with ambulation  They have a pertinent past medical history of ovarian cancer, hep B carrier, intelectual disability, TB (childhood), currently lives in group home.  ED course was notable for workup for a worsening cough which had been present for two days, CT chest demonstrated a large partially loculated right pleural effusion with atelectasis of the right upper lobe. She was also found to have a temp of 100.9, WBC 31.9, HBG 9.3.  She was admitted to the hopsitalist service and treatment was initiated for CAP. She was initially started on doxy but is now on Ceftriaxone and azithromycin   Appears that the patient was accepted to [facility] service.   Pertinent  Medical History  Ovarian mass,  hep B carrier, intelectual disability, TB (childhood), currently lives in group home  [facility] Events: Including procedures, antibiotic start and stop dates in addition to other pertinent events   10/29 Admit to [facility]. CT chest shows R pleural effusion. 10/30-31 TXR to [facility]. 11/1 -placement of right fifth intercostal space 20 French chest tube  Interim History / Subjective:   Decreasing O2 requirements.  Chest tube continues to drain.  Still moderate pain at insertion site. Agitated, removing IV's Objective   Blood pressure (!) 86/52, pulse 93, temperature 98.1 F (36.7 C), temperature source Axillary, resp. rate (!) 27, height 5' 4"  (1.626 m), weight 50.4 kg, SpO2 100 %.        Intake/Output Summary (Last  24 hours) at 02/05/2021 0813 Last data filed at 02/05/2021 0604 Gross per 24 hour  Intake 407.79 ml  Output 1690 ml  Net -1282.21 ml    Filed Weights   02/02/21 0400 02/04/21 0543 02/05/21 0500  Weight: 52.2 kg 50.8 kg 50.4 kg    Examination: Sedated on precedex Minimal chest tube output CXR reviewed She won't answer questions for me Ext with muscle wasting, no edema  Persistent leukocytosis  Ancillary tests  Fluid analysis consistent with exudate per Light's criteria Chest x-ray today personally reviewed and shows significant improvement in right pleural effusion.  Left lung consolidation also improving. Improving leukocytosis now 26.1 No malignant cells seen on cytology.   Assessment & Plan:   Recurrent aspiration pneumonia with left airspace disease and R empyema Right adrenal mass Schizophrenia, controlled  Hypokalemia Hep B Normocytic Anemia Malnutrition-unspecified Elevated Ca-125, cystic lesion of ovary- will need OP f/u Distant hx of treated TB  Looking back through her scans, she had highly suspicious lung lesions on Oct 19 scan which regressed on Nov 1 scan.  This would be less consistent with cancer.  The pleural fluid characteristics are c/w empyema and clinical history of migrating infiltrates would be more c/w aspiration events.  CTX to augmentin, prolonged course planned Wean precedex, switch antipsychotics to standing Safety sitter SLP eval Will DC posterior chest tube which appears to be in fissue and place anterior tube. Will need repeat chest imaging in about 6 weeks I do not think she is a good decortication candidate, we will probably get  to point of "good enough" and allow remaining lung to scar down  Best Practice (right click and "Reselect all SmartList Selections" daily)   Best Practice (right click and "Reselect all SmartList Selections" daily)   Diet/type: Regular consistency (see orders) DVT prophylaxis: prophylactic heparin  GI  prophylaxis: N/A Lines: N/A Foley:  N/A Code Status:  full code Last date of multidisciplinary goals of care discussion [Will need to update guardian once CT performed]   Patient critically ill due to agitation, encephalopathy Interventions to address this today precedex titration Risk of deterioration without these interventions is high  I personally spent 41 minutes providing critical care not including any separately billable procedures  Erskine Emery MD [facility]  Prefer epic messenger for cross cover needs If after hours, please call E-link

## 2021-02-06 ENCOUNTER — Inpatient Hospital Stay (HOSPITAL_COMMUNITY): Payer: Medicare Other

## 2021-02-06 DIAGNOSIS — J9601 Acute respiratory failure with hypoxia: Secondary | ICD-10-CM | POA: Diagnosis not present

## 2021-02-06 LAB — CBC
HCT: 23.1 % — ABNORMAL LOW (ref 36.0–46.0)
Hemoglobin: 7.3 g/dL — ABNORMAL LOW (ref 12.0–15.0)
MCH: 27.3 pg (ref 26.0–34.0)
MCHC: 31.6 g/dL (ref 30.0–36.0)
MCV: 86.5 fL (ref 80.0–100.0)
Platelets: 330 10*3/uL (ref 150–400)
RBC: 2.67 MIL/uL — ABNORMAL LOW (ref 3.87–5.11)
RDW: 13.9 % (ref 11.5–15.5)
WBC: 23.6 10*3/uL — ABNORMAL HIGH (ref 4.0–10.5)
nRBC: 0 % (ref 0.0–0.2)

## 2021-02-06 IMAGING — DX DG CHEST 1V PORT
1 series · 1 of 1 positions shown · non-contrast
Comparison: [DATE]

CLINICAL DATA: Short of breath abdominal pain

EXAM:
PORTABLE CHEST 1 VIEW

[chest ap]
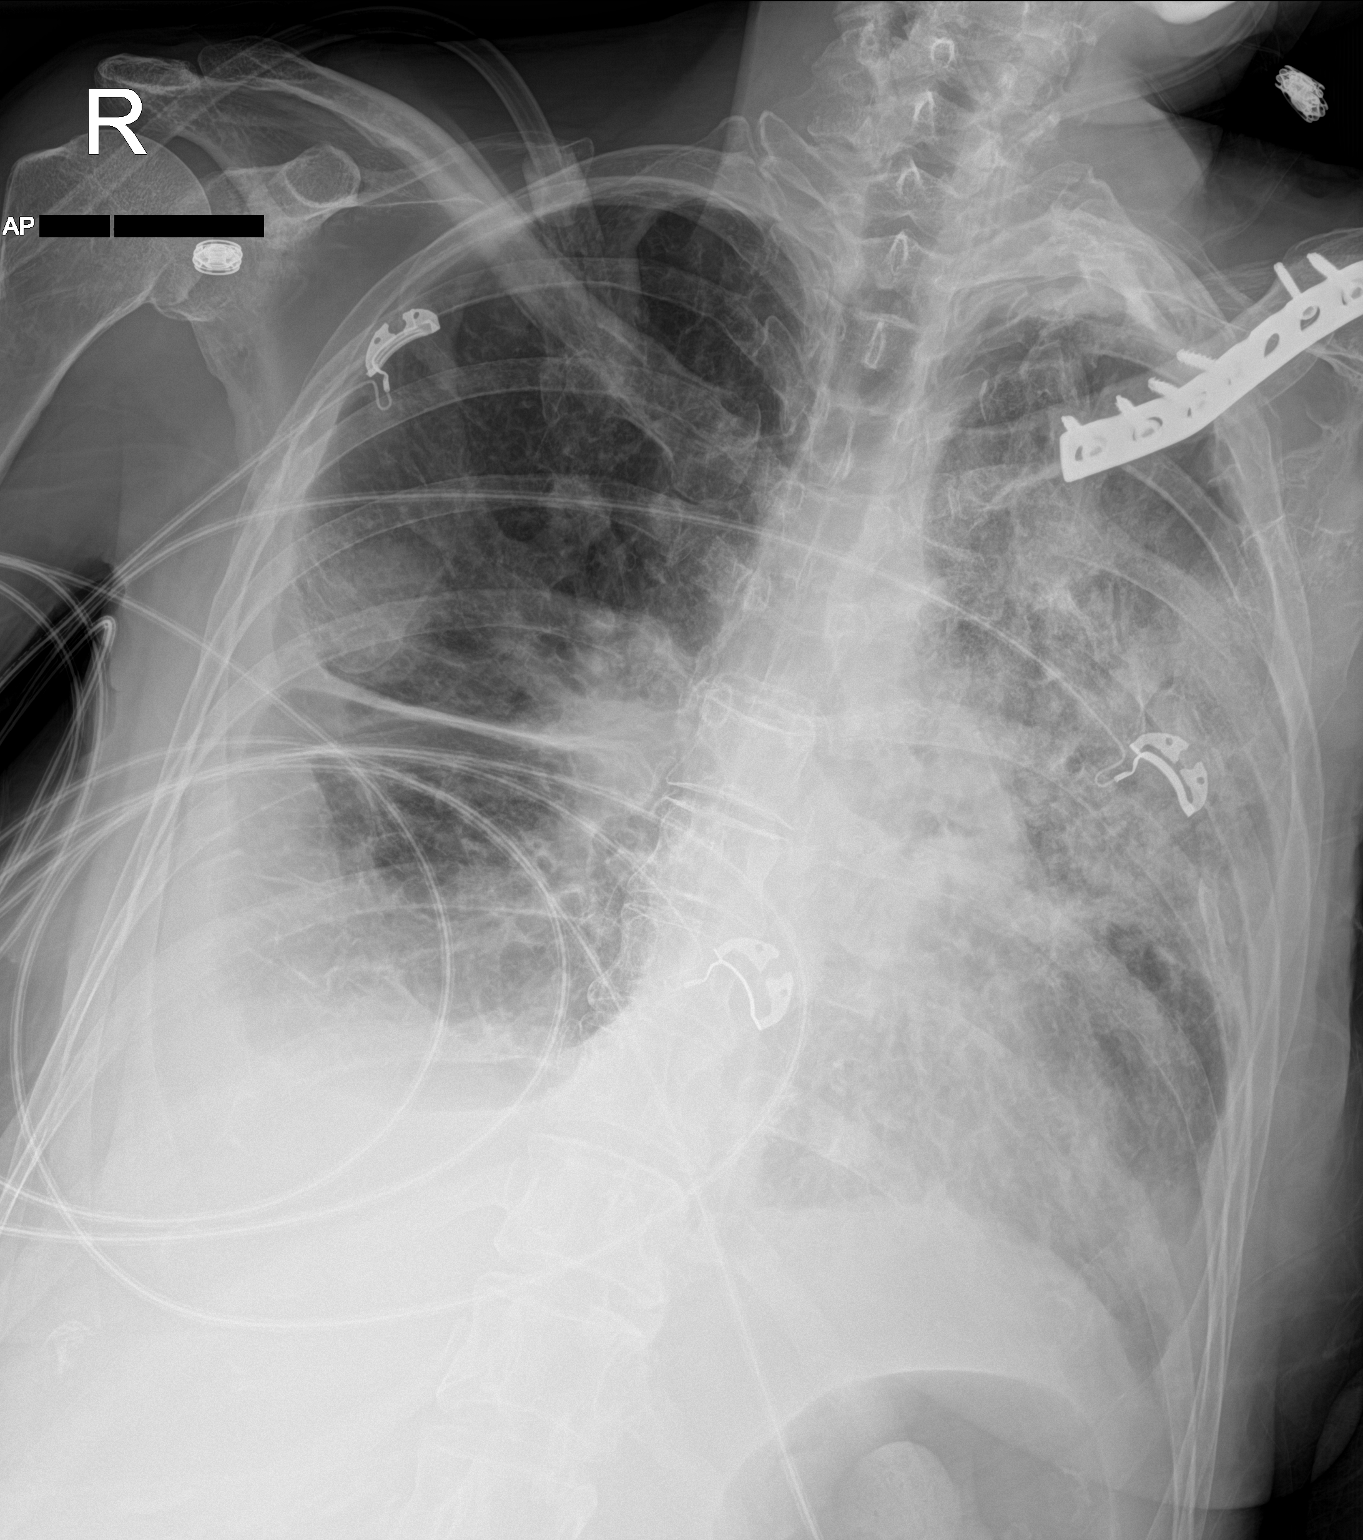

[1 of 1 positions shown; findings below may reference images not displayed]

FINDINGS: Extensive airspace disease throughout the left upper lobe and left
lower lobe appears similar. Right lower lobe airspace disease and
right pleural effusion with mild progression.

Chronic left rib fractures. ORIF left clavicle fracture which is
chronic.
IMPRESSION: Bilateral airspace disease left greater than right. No change on the
left. Mild progression of right lower lobe infiltrate and right
effusion.

## 2021-02-06 IMAGING — DX DG CHEST 1V PORT
1 series · 1 of 1 positions shown · non-contrast
Comparison: Plain film [DATE], CT chest [DATE], plain film
[DATE]

CLINICAL DATA: 49-year-old female with pulmonary disease, history
of tuberculosis

EXAM:
PORTABLE CHEST 1 VIEW

[chest ap]
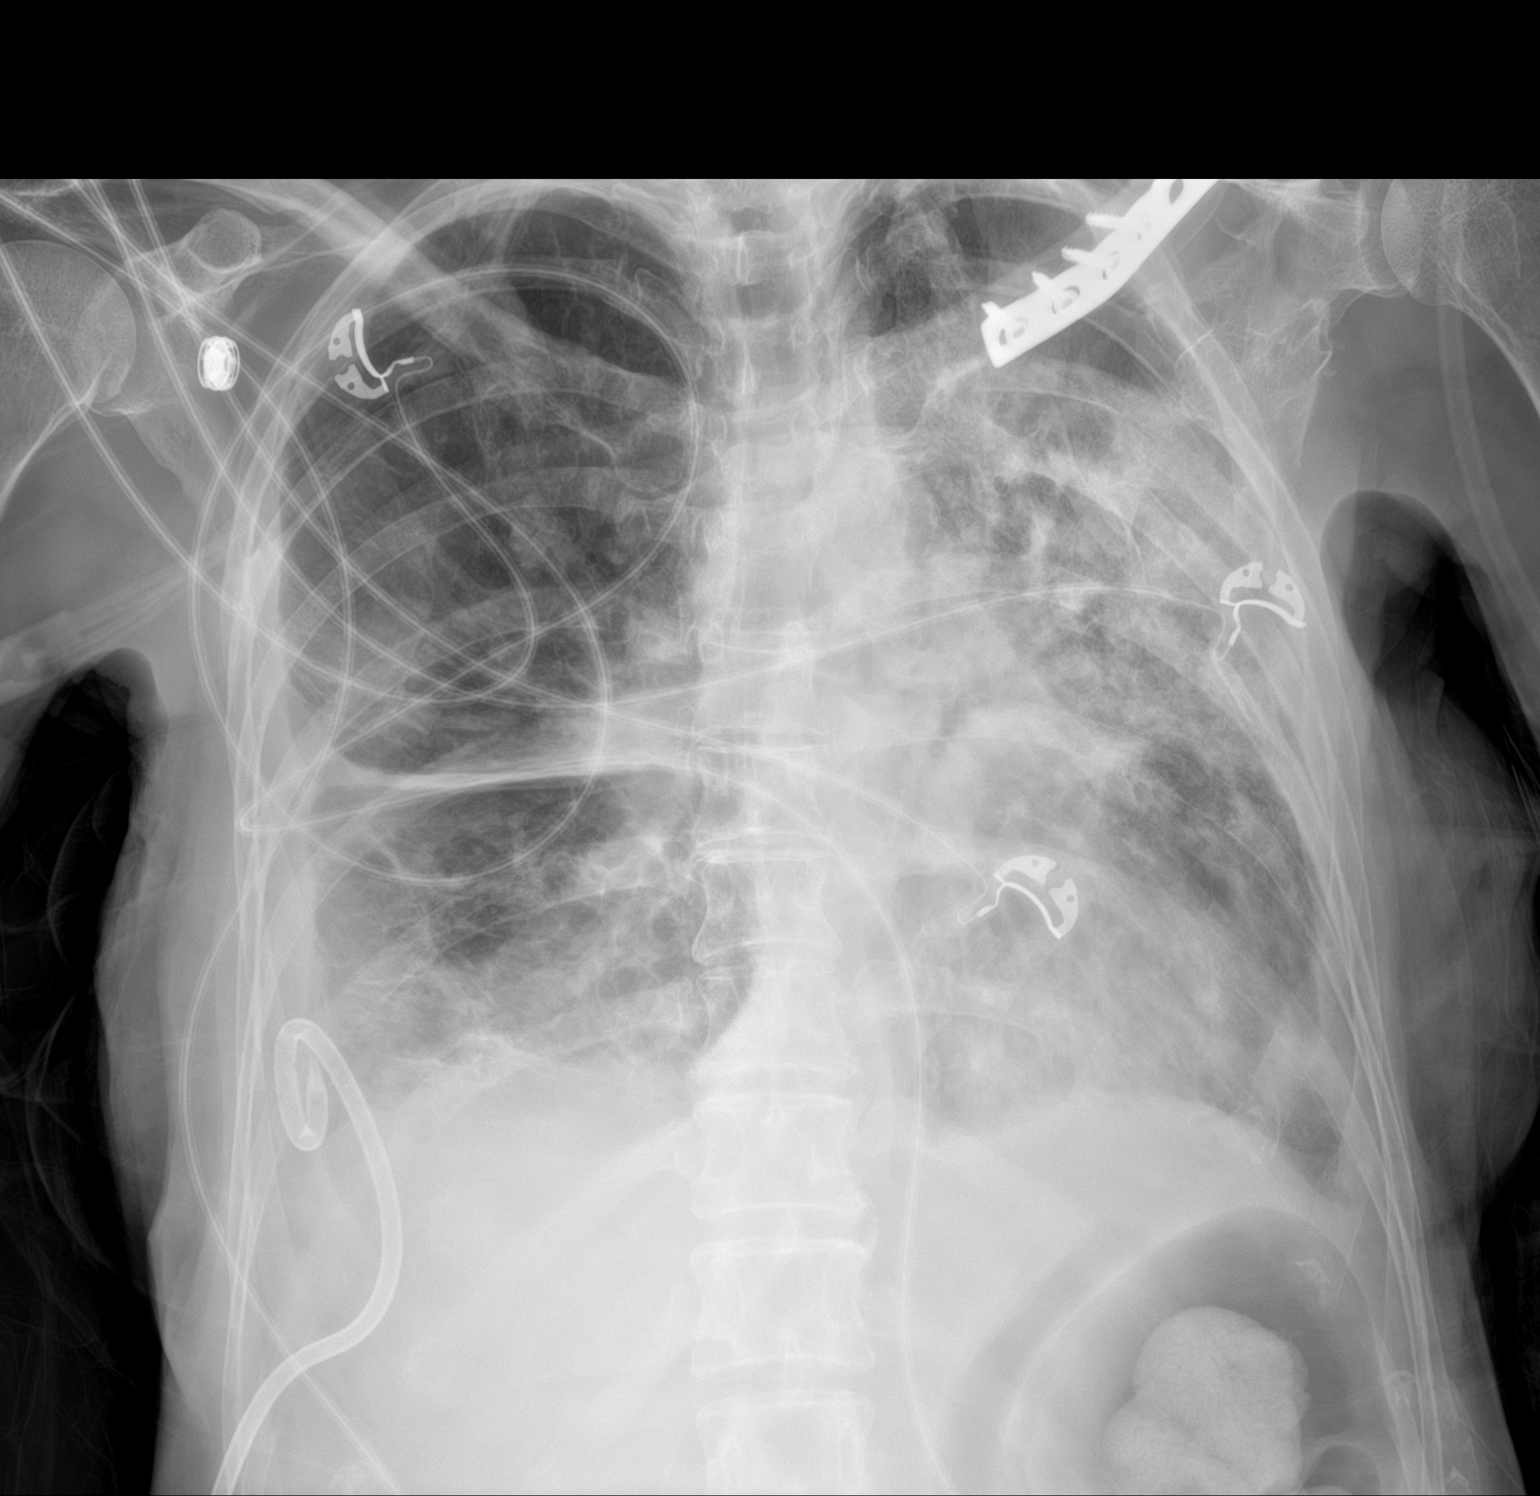

[1 of 1 positions shown; findings below may reference images not displayed]

FINDINGS: Cardiomediastinal silhouette likely unchanged in size and contour,
with the heart borders partially obscured by overlying lung/pleural
disease.

Interval placement of pigtail drainage catheter/pleural catheter on
the right with decreased opacity at the right lung base.
Pleuroparenchymal thickening persists with partial obscuration of
the right hemidiaphragm persisting. Minor fissure thickening
persists.

Multifocal mixed interstitial and airspace disease, left greater
than right, persisting and essentially unchanged.

No pneumothorax.

Surgical changes of left clavicle again noted.
IMPRESSION: Interval placement of pigtail drainage catheter in the inferior
right pleural space, with decreased pleural fluid and no
complicating features.

Essentially unchanged multifocal left greater than right
interstitial and airspace opacity.

## 2021-02-06 MED ORDER — SODIUM CHLORIDE (PF) 0.9 % IJ SOLN
10.0000 mg | INTRAMUSCULAR | Status: AC
  Administered 2021-02-06: 10 mg via INTRAPLEURAL
  Filled 2021-02-06: qty 10

## 2021-02-06 MED ORDER — MIDAZOLAM HCL 2 MG/2ML IJ SOLN: 2.0000 mg | INTRAMUSCULAR | Status: DC | PRN

## 2021-02-06 MED ORDER — STERILE WATER FOR INJECTION IJ SOLN
5.0000 mg | RESPIRATORY_TRACT | Status: AC
  Administered 2021-02-06: 5 mg via INTRAPLEURAL
  Filled 2021-02-06: qty 2.5

## 2021-02-06 MED ORDER — LORAZEPAM 2 MG/ML IJ SOLN
2.0000 mg | Freq: Once | INTRAMUSCULAR | Status: AC
  Administered 2021-02-06: 2 mg via INTRAVENOUS
  Filled 2021-02-06: qty 1

## 2021-02-06 NOTE — Progress Notes (Signed)
NAME:  Margaret [name], MRN:  [id number], DOB:  1970/10/31, LOS: 7 ADMISSION DATE:  01/29/2021, CONSULTATION DATE:  10/30 REFERRING MD:  Roderic Palau, REASON FOR CONSULT: pleural effusion  History of Present Illness:  Margaret [name] is a 50 y.o. female who is seen in consultation at the request of Dr. Roderic Palau for recommendations on further evaluation and management of pleural effusion.   Margaret [name], is a 50 y.o. female, who presented to [facility] ED on 10/29 with a chief complaint of difficulty with ambulation  They have a pertinent past medical history of ovarian cancer, hep B carrier, intelectual disability, TB (childhood), currently lives in group home.  ED course was notable for workup for a worsening cough which had been present for two days, CT chest demonstrated a large partially loculated right pleural effusion with atelectasis of the right upper lobe. She was also found to have a temp of 100.9, WBC 31.9, HBG 9.3.  She was admitted to the hopsitalist service and treatment was initiated for CAP. She was initially started on doxy but is now on Ceftriaxone and azithromycin   Appears that the patient was accepted to [facility] service.   Pertinent  Medical History  Ovarian mass,  hep B carrier, intelectual disability, TB (childhood), currently lives in group home  [facility] Events: Including procedures, antibiotic start and stop dates in addition to other pertinent events   10/29 Admit to [facility]. CT chest shows R pleural effusion. 10/30-31 TXR to [facility]. 11/1 -placement of right fifth intercostal space 20 French chest tube  Interim History / Subjective:  Agitated again overnight  Objective   Blood pressure (!) 94/56, pulse (!) 106, temperature 98.5 F (36.9 C), temperature source Oral, resp. rate (!) 25, height 5' 4"  (1.626 m), weight 51.9 kg, SpO2 100 %.        Intake/Output Summary (Last 24 hours) at 02/06/2021 0745 Last data filed at 02/06/2021 0600 Gross per 24 hour  Intake 1273.96  ml  Output 685 ml  Net 588.96 ml    Filed Weights   02/04/21 0543 02/05/21 0500 02/06/21 0500  Weight: 50.8 kg 50.4 kg 51.9 kg    Examination:   Leukocytosis persistent  Ancillary tests  Persistent leukocytosis Stable anemia CXR continued basilar R effusion, L airspace disease No BMP this am  Assessment & Plan:   Recurrent aspiration pneumonia with left airspace disease and R empyema Right adrenal mass Schizophrenia, controlled  Hypokalemia Hep B Normocytic Anemia Malnutrition-unspecified Elevated Ca-125, cystic lesion of ovary- will need OP f/u Distant hx of treated TB  Looking back through her scans, she had highly suspicious lung lesions on Oct 19 scan which regressed on Nov 1 scan.  This would be less consistent with cancer.  The pleural fluid characteristics are c/w empyema and clinical history of migrating infiltrates would be more c/w aspiration events.  CTX to augmentin, prolonged course planned Posterior pigtail and lytic trial, not surgical candidate Antipsychotic meds as ordered, try to wean precedex  Best Practice (right click and "Reselect all SmartList Selections" daily)   Best Practice (right click and "Reselect all SmartList Selections" daily)   Diet/type: Regular consistency (see orders) DVT prophylaxis: prophylactic heparin  GI prophylaxis: N/A Lines: N/A Foley:  N/A Code Status:  full code Last date of multidisciplinary goals of care discussion [Will need to update guardian once CT performed]   Patient critically ill due to agitation, encephalopathy Interventions to address this today precedex titration Risk of deterioration without these interventions is high  I personally spent 31 minutes providing critical care not including any separately billable procedures  Erskine Emery MD [facility]  Prefer epic messenger for cross cover needs If after hours, please call E-link

## 2021-02-06 NOTE — Progress Notes (Signed)
[facility] Progress Note Patient Name: Margaret [name] DOB: [birth year] MRN: [id number]   Date of Service  02/06/2021  HPI/Events of Note  Agitation - Nursing request for new Posey belt order.   eICU Interventions  Will order soft waist belt restraint X 8 hours.     Intervention Category Major Interventions: Delirium, psychosis, severe agitation - evaluation and management  Marina Boerner Eugene 02/06/2021, 1:28 AM

## 2021-02-06 NOTE — Procedures (Signed)
Insertion of Chest Tube Procedure Note  Caylah Plouff  [id number]  1970-10-06  Date:02/06/21  Time:10:54 AM    Provider Performing: Candee Furbish   Procedure: Pleural Catheter Insertion w/ Imaging Guidance [phone])  Indication(s) Effusion  Consent Obtained from guardian  Anesthesia Topical only with 1% lidocaine    Time Out Verified patient identification, verified procedure, site/side was marked, verified correct patient position, special equipment/implants available, medications/allergies/relevant history reviewed, required imaging and test results available.   Sterile Technique Maximal sterile technique including full sterile barrier drape, hand hygiene, sterile gown, sterile gloves, mask, hair covering, sterile ultrasound probe cover (if used).   Procedure Description Ultrasound used to identify appropriate pleural anatomy for placement and overlying skin marked. Area of placement cleaned and draped in sterile fashion.  A 14 French pigtail pleural catheter was placed into the right pleural space using Seldinger technique. Appropriate return of pus was obtained.  The tube was connected to atrium and placed on -20 cm H2O wall suction.   Complications/Tolerance None; patient tolerated the procedure well. Chest X-ray is ordered to verify placement.   EBL Minimal  Specimen(s) fluid

## 2021-02-06 NOTE — Procedures (Signed)
Pleural Fibrinolytic Administration Procedure Note  Margaret [name]  [id number]  [birth year]  Date:02/06/21  Time:11:52 AM   Provider Performing:Montford Barg C Tamala Julian   Procedure: Pleural Fibrinolysis Subsequent day [phone])  Indication(s) Fibrinolysis of complicated pleural effusion  Consent Risks of the procedure as well as the alternatives and risks of each were explained to the patient and/or caregiver.  Consent for the procedure was obtained.   Anesthesia None   Time Out Verified patient identification, verified procedure, site/side was marked, verified correct patient position, special equipment/implants available, medications/allergies/relevant history reviewed, required imaging and test results available.   Sterile Technique Hand hygiene, gloves   Procedure Description Existing pleural catheter was cleaned and accessed in sterile manner.  40m of tPA in 30cc of saline and 571mof dornase in 30cc of sterile water were injected into pleural space using existing pleural catheter.  Catheter will be clamped for 1 hour and then placed back to suction.   Complications/Tolerance None; patient tolerated the procedure well.  EBL None   Specimen(s) None

## 2021-02-07 ENCOUNTER — Inpatient Hospital Stay (HOSPITAL_COMMUNITY): Payer: Medicare Other

## 2021-02-07 DIAGNOSIS — J9601 Acute respiratory failure with hypoxia: Secondary | ICD-10-CM | POA: Diagnosis not present

## 2021-02-07 LAB — CBC
HCT: 24 % — ABNORMAL LOW (ref 36.0–46.0)
Hemoglobin: 7.2 g/dL — ABNORMAL LOW (ref 12.0–15.0)
MCH: 26.3 pg (ref 26.0–34.0)
MCHC: 30 g/dL (ref 30.0–36.0)
MCV: 87.6 fL (ref 80.0–100.0)
Platelets: 341 10*3/uL (ref 150–400)
RBC: 2.74 MIL/uL — ABNORMAL LOW (ref 3.87–5.11)
RDW: 14.1 % (ref 11.5–15.5)
WBC: 19.1 10*3/uL — ABNORMAL HIGH (ref 4.0–10.5)
nRBC: 0 % (ref 0.0–0.2)

## 2021-02-07 MED ORDER — STERILE WATER FOR INJECTION IJ SOLN
5.0000 mg | RESPIRATORY_TRACT | Status: AC
  Administered 2021-02-07: 5 mg via INTRAPLEURAL
  Filled 2021-02-07: qty 5

## 2021-02-07 MED ORDER — MIDAZOLAM HCL 2 MG/2ML IJ SOLN: 0.5000 mg | INTRAMUSCULAR | Status: DC | PRN

## 2021-02-07 MED ORDER — SODIUM CHLORIDE (PF) 0.9 % IJ SOLN
10.0000 mg | INTRAMUSCULAR | Status: AC
  Administered 2021-02-07: 10 mg via INTRAPLEURAL
  Filled 2021-02-07: qty 10

## 2021-02-07 NOTE — Progress Notes (Addendum)
NAME:  Margaret [name], MRN:  [id number], DOB:  December 05, 1970, LOS: 8 ADMISSION DATE:  01/29/2021, CONSULTATION DATE:  10/30 REFERRING MD:  Roderic Palau, REASON FOR CONSULT: pleural effusion  History of Present Illness:  Margaret [name] is a 50 y.o. female who is seen in consultation at the request of Dr. Roderic Palau for recommendations on further evaluation and management of pleural effusion.   Margaret [name], is a 50 y.o. female, who presented to the [facility] ED on 10/29 with a chief complaint of difficulty with ambulation  They have a pertinent past medical history of ovarian cancer, hep B carrier, intelectual disability, TB (childhood), currently lives in group home.  ED course was notable for workup for a worsening cough which had been present for two days, CT chest demonstrated a large partially loculated right pleural effusion with atelectasis of the right upper lobe. She was also found to have a temp of 100.9, WBC 31.9, HBG 9.3.  She was admitted to the hopsitalist service and treatment was initiated for CAP. She was initially started on doxy but is now on Ceftriaxone and azithromycin   Appears that the patient was accepted to [facility] service.   Pertinent  Medical History  Ovarian mass,  hep B carrier, intelectual disability, TB (childhood), currently lives in group home  [facility] Events: Including procedures, antibiotic start and stop dates in addition to other pertinent events   10/29 Admit to [facility]. CT chest shows R pleural effusion. 10/30-31 TXR to [facility]. 11/1 -placement of right fifth intercostal space 20 French chest tube 11/5 -20 Fr removed, posterior pigtail placed  Interim History / Subjective:  More sleepy this am.  No events, good drainage from catheter.  Objective   Blood pressure (!) 95/55, pulse (!) 105, temperature 98.5 F (36.9 C), temperature source Oral, resp. rate (!) 27, height 5' 4"  (1.626 m), weight 48.8 kg, SpO2 100 %.        Intake/Output Summary (Last 24 hours) at  02/07/2021 0723 Last data filed at 02/07/2021 0600 Gross per 24 hour  Intake 1057.66 ml  Output 2725 ml  Net -1667.34 ml    Filed Weights   02/05/21 0500 02/06/21 0500 02/07/21 0500  Weight: 50.4 kg 51.9 kg 48.8 kg    Examination: No distress Chest tube with cloudy output Ext with no edema Somnolent but follow commands Heart sounds regular, ext warm  Leukocytosis improved  Ancillary tests  Persistent leukocytosis Stable anemia CXR continued basilar R effusion, L airspace disease No BMP this am  Assessment & Plan:   Recurrent aspiration pneumonia with left airspace disease and R empyema Right adrenal mass Schizophrenia, controlled  Dysphagia Hypokalemia Hep B Normocytic Anemia Malnutrition-unspecified Elevated Ca-125, cystic lesion of ovary- will need OP f/u Distant hx of treated TB  Looking back through her scans, she had highly suspicious lung lesions on Oct 19 scan which regressed on Nov 1 scan.  This would be less consistent with cancer.  The pleural fluid characteristics are c/w empyema and clinical history of migrating infiltrates would be more c/w aspiration events.  Augmentin x 21 more days TPA/dornase again to R pleural tube Reduce versed dosing Progressive mobility as able Remove foley Will need close attention to outpatient scans given her strange migrating mass-like lesions Esophagram per SLP recommendations Stable for transfer to tele, we will follow for pigtail  Best Practice (right click and "Reselect all SmartList Selections" daily)   Best Practice (right click and "Reselect all SmartList Selections" daily)   Diet/type: Regular consistency (see  orders) DVT prophylaxis: prophylactic heparin  GI prophylaxis: N/A Lines: N/A Foley:  N/A Code Status:  full code Last date of multidisciplinary goals of care discussion [guardians call in for updates regularly]  Erskine Emery MD [facility]  Prefer epic messenger for cross cover  needs If after hours, please call E-link

## 2021-02-07 NOTE — Procedures (Signed)
Pleural Fibrinolytic Administration Procedure Note  Margaret [name]  [id number]  08-15-1970  Date:02/07/21  Time:8:24 AM   Provider Performing:Cindie Rajagopalan C Tamala Julian   Procedure: Pleural Fibrinolysis Subsequent day [phone])  Indication(s) Fibrinolysis of complicated pleural effusion  Consent Risks of the procedure as well as the alternatives and risks of each were explained to the patient and/or caregiver.  Consent for the procedure was obtained.   Anesthesia None   Time Out Verified patient identification, verified procedure, site/side was marked, verified correct patient position, special equipment/implants available, medications/allergies/relevant history reviewed, required imaging and test results available.   Sterile Technique Hand hygiene, gloves   Procedure Description Existing pleural catheter was cleaned and accessed in sterile manner.  59m of tPA in 30cc of saline and 54mof dornase in 30cc of sterile water were injected into pleural space using existing pleural catheter.  Catheter will be clamped for 1 hour and then placed back to suction.   Complications/Tolerance None; patient tolerated the procedure well.  EBL None   Specimen(s) None

## 2021-02-08 ENCOUNTER — Ambulatory Visit (HOSPITAL_COMMUNITY): Payer: Medicare Other

## 2021-02-08 ENCOUNTER — Inpatient Hospital Stay (HOSPITAL_COMMUNITY): Payer: Medicare Other

## 2021-02-08 DIAGNOSIS — J810 Acute pulmonary edema: Secondary | ICD-10-CM | POA: Diagnosis not present

## 2021-02-08 DIAGNOSIS — J189 Pneumonia, unspecified organism: Secondary | ICD-10-CM | POA: Diagnosis not present

## 2021-02-08 DIAGNOSIS — J9601 Acute respiratory failure with hypoxia: Secondary | ICD-10-CM | POA: Diagnosis not present

## 2021-02-08 DIAGNOSIS — D72829 Elevated white blood cell count, unspecified: Secondary | ICD-10-CM

## 2021-02-08 DIAGNOSIS — J432 Centrilobular emphysema: Secondary | ICD-10-CM | POA: Diagnosis not present

## 2021-02-08 LAB — CBC
HCT: 26.3 % — ABNORMAL LOW (ref 36.0–46.0)
Hemoglobin: 8.1 g/dL — ABNORMAL LOW (ref 12.0–15.0)
MCH: 26.8 pg (ref 26.0–34.0)
MCHC: 30.8 g/dL (ref 30.0–36.0)
MCV: 87.1 fL (ref 80.0–100.0)
Platelets: 418 10*3/uL — ABNORMAL HIGH (ref 150–400)
RBC: 3.02 MIL/uL — ABNORMAL LOW (ref 3.87–5.11)
RDW: 14.3 % (ref 11.5–15.5)
WBC: 22 10*3/uL — ABNORMAL HIGH (ref 4.0–10.5)
nRBC: 0 % (ref 0.0–0.2)

## 2021-02-08 IMAGING — RF DG ESOPHAGUS
6 series · 14 of 18 positions shown · non-contrast
Comparison: NONE.

CLINICAL DATA: Difficulty swallowing food.

EXAM:
ESOPHAGUS/BARIUM SWALLOW/TABLET STUDY
TECHNIQUE: Single contrast examination was performed using thin liquid barium.
This exam was performed by TIGER, and was supervised
and interpreted by TIGER.
FLUOROSCOPY TIME:  Radiation Exposure Index (as provided by the
fluoroscopic device):
If the device does not provide the exposure index:
Fluoroscopy Time:  2 minutes 12 seconds.
Number of Acquired Images:  None.

[Series 1: cp_standard · 0.37mm/px · 3 of 128 frames shown (1 of 6)]
[frame 20/128]
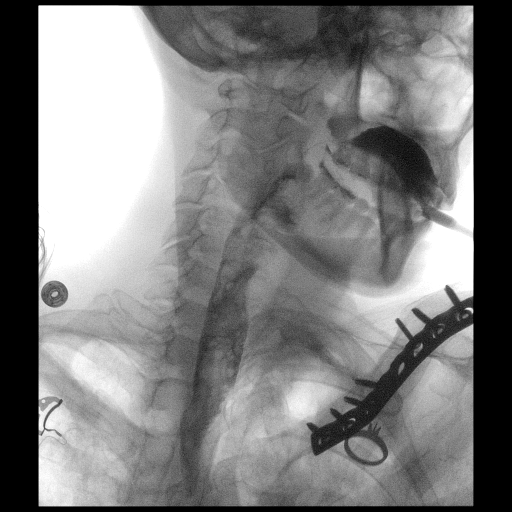
[frame 52/128]
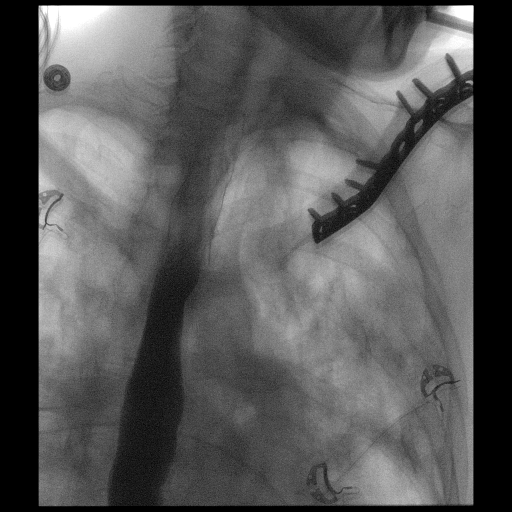
[frame 109/128]
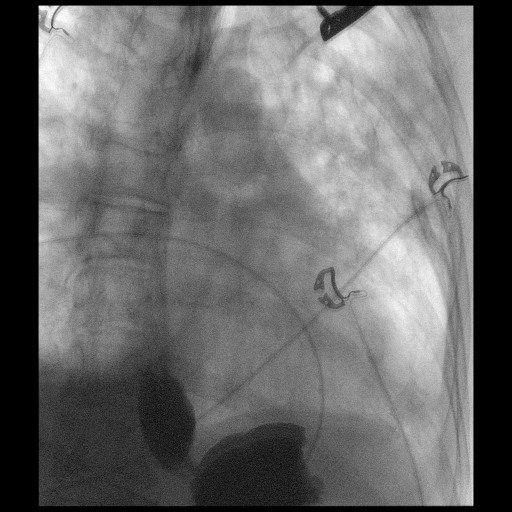

[Series 2: cp_standard · 0.37mm/px · 3 of 175 frames shown (2 of 6)]
[frame 27/175]
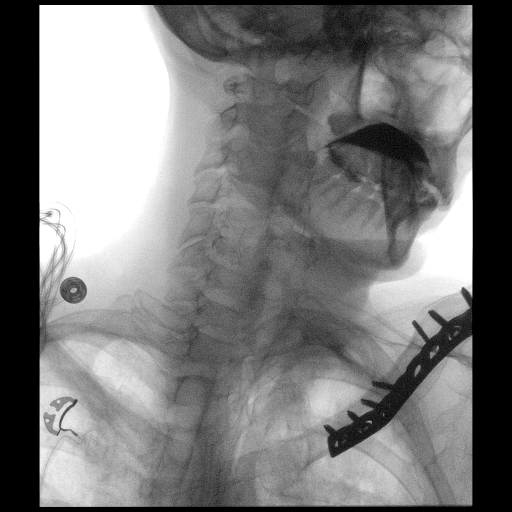
[frame 88/175]
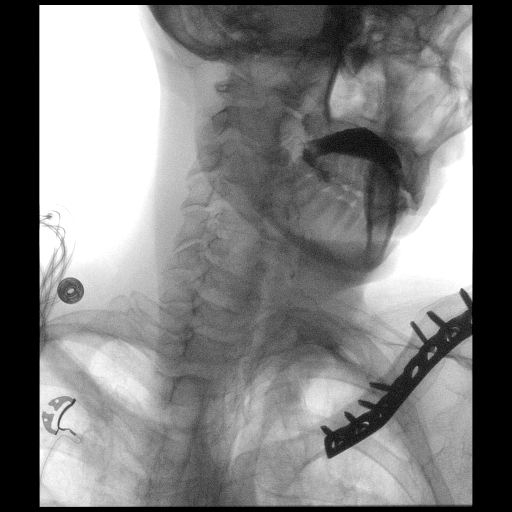
[frame 149/175]
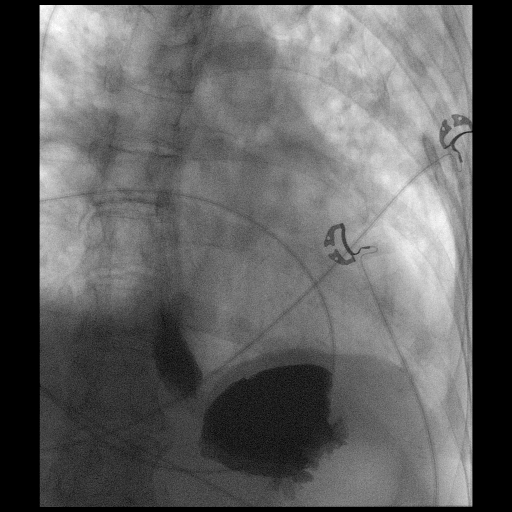

[Series 3: cp_standard · 0.39mm/px · 3 of 54 frames shown (3 of 6)]
[frame 9/54]
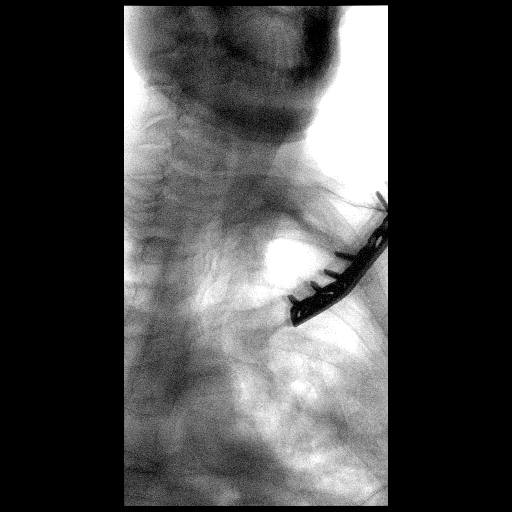
[frame 10/54]
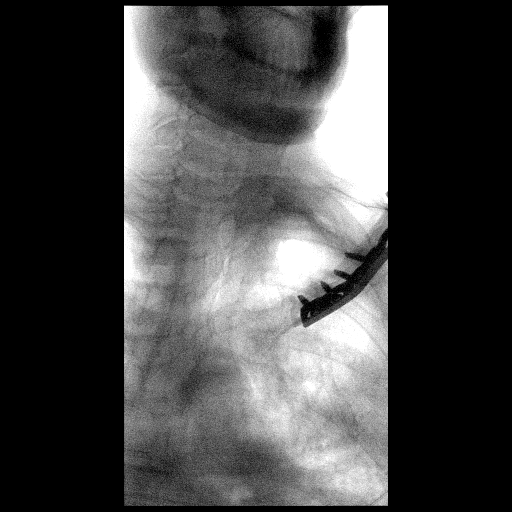
[frame 28/54]
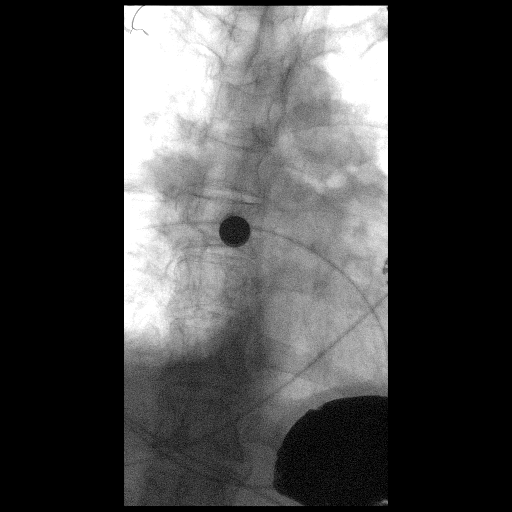

[Series 4: cp_standard · 0.40mm/px · 3 of 101 frames shown (4 of 6)]
[frame 16/101]
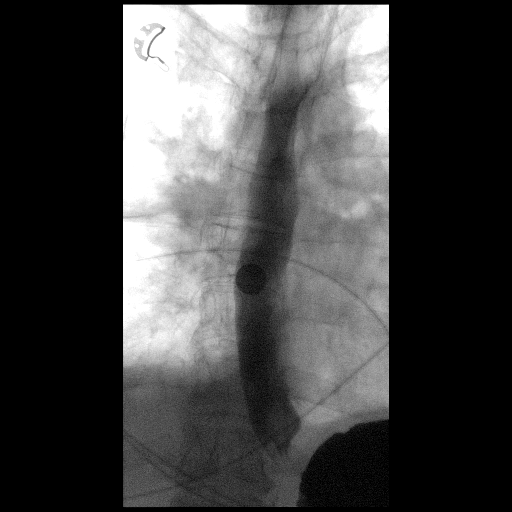
[frame 46/101]
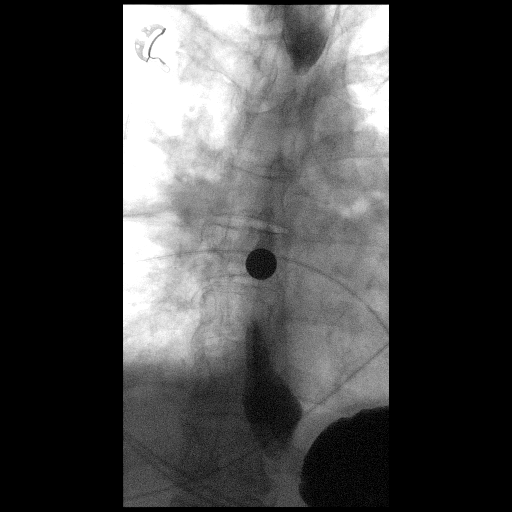
[frame 51/101]
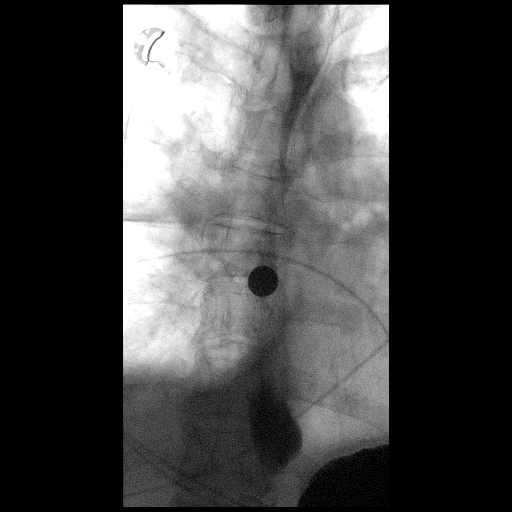

[Series 5: cp_standard · 0.20mm/px · 1 of 1 slices shown (5 of 6)]
[im 1/1]
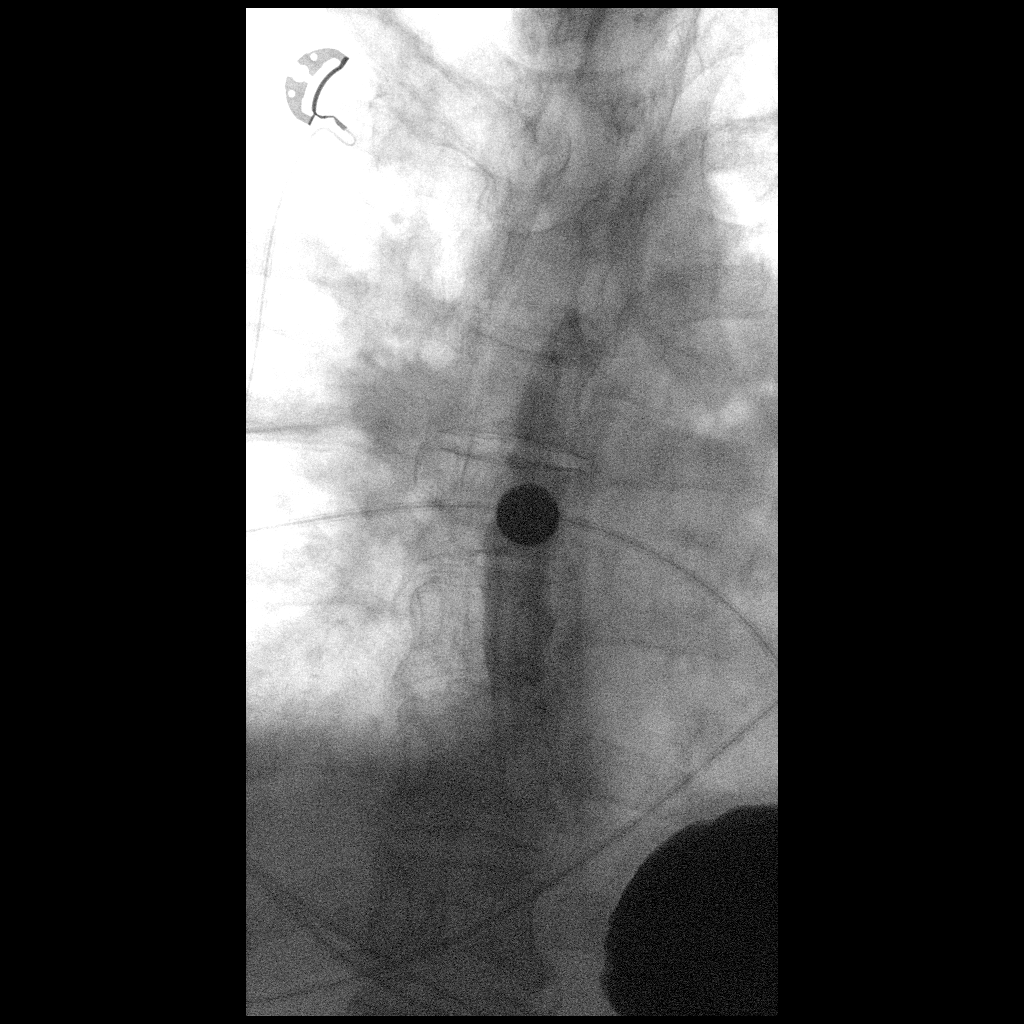

[Series 6: cp_standard · 0.19mm/px · 1 of 1 slices shown (6 of 6)]
[im 1/1]
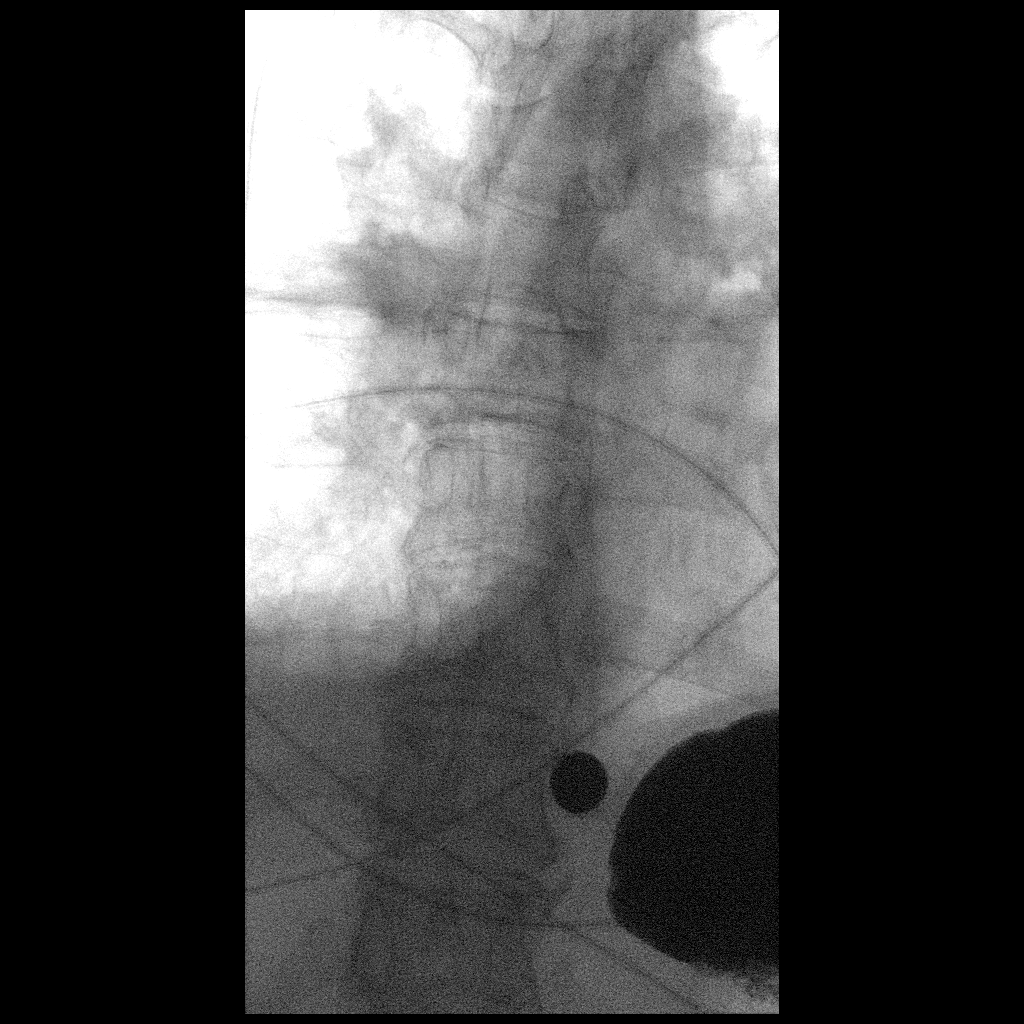

[14 of 18 positions shown; findings below may reference images not displayed]

FINDINGS: Swallowing: Appears normal. No vestibular penetration or aspiration
seen.

Pharynx: Unremarkable.

Esophagus: There may be mild esophageal fold thickening.

Esophageal motility: Within normal limits.

Hiatal Hernia: None.

Gastroesophageal reflux: None.

Ingested 13mm barium tablet: A 13 mm barium tablet would not pass
beyond the gastroesophageal junction, suggesting very minimal
narrowing. No high-grade stricture.

Other: None.
IMPRESSION: 1. A 13 mm barium tablet would not pass beyond the gastroesophageal
junction, suggesting very minimal narrowing. No high-grade
stricture.
2. Probable mild esophageal fold thickening.

## 2021-02-08 IMAGING — DX DG CHEST 1V
1 series · 1 of 1 positions shown · non-contrast
Comparison: Prior chest x-ray [DATE]

CLINICAL DATA: Empyema with chest tube in place

EXAM:
CHEST  1 VIEW

[chest ap]
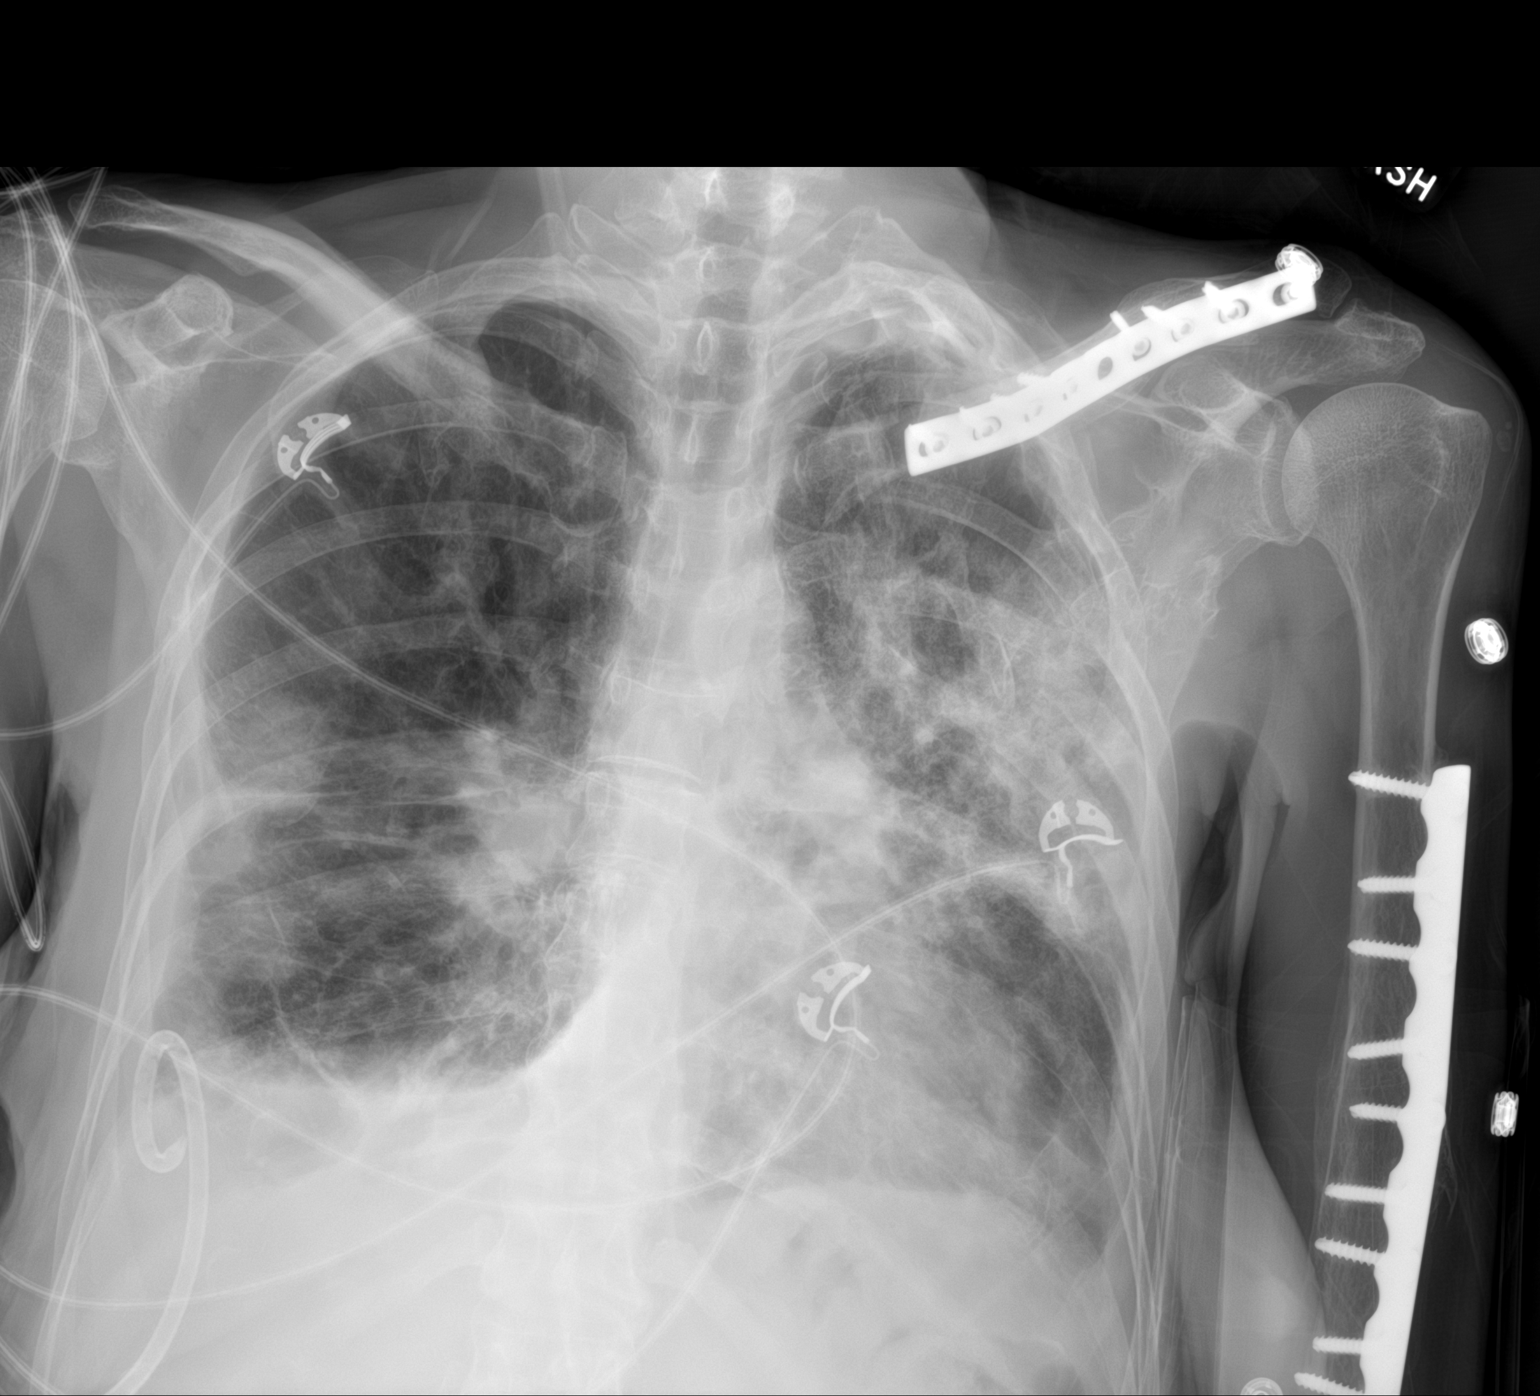

[1 of 1 positions shown; findings below may reference images not displayed]

FINDINGS: Right basilar pigtail thoracostomy tube in unchanged position. Small
amount of residual pleural thickening versus pleural fluid persists.
Increasing patchy airspace opacities in the periphery of the right
lung. Persistent patchy airspace opacities in the left upper and mid
lung without significant interval change. No pneumothorax. No acute
osseous abnormality. Hardware consistent with prior ORIF of left
clavicle and left humerus fractures remains in place.
IMPRESSION: 1. Increasing patchy airspace opacities in the periphery of the
right mid lung concerning for new atelectasis or pneumonia.
2. Stable and satisfactory position of right basilar pigtail
thoracostomy tube.
3. Persistent advanced airspace opacities throughout the left mid
and upper lung consistent with known multifocal pneumonia.

## 2021-02-08 MED ORDER — STERILE WATER FOR INJECTION IJ SOLN
5.0000 mg | RESPIRATORY_TRACT | Status: AC
  Administered 2021-02-08: 5 mg via INTRAPLEURAL
  Filled 2021-02-08: qty 5

## 2021-02-08 MED ORDER — SODIUM CHLORIDE (PF) 0.9 % IJ SOLN
10.0000 mg | INTRAMUSCULAR | Status: AC
  Administered 2021-02-08: 10 mg via INTRAPLEURAL
  Filled 2021-02-08: qty 10

## 2021-02-08 NOTE — Progress Notes (Addendum)
[facility] Progress Note Patient Name: Margaret [name] DOB: Dec 20, 1970 MRN: [id number]   Date of Service  02/08/2021  HPI/Events of Note  6 E bedside RN reporting a blood pressure of 99/51, HR 106, Saturation 98 % which per their protocol makes her MEWS score a yellow, patient's respiratory rate is within normal limits, and she is without distress.  eICU Interventions  Bedside RN advised to continue to monitor patient closely, no PCCM or E-link intervention indicated at this moment.        Kerry Kass Gerell Fortson 02/08/2021, 10:39 PM

## 2021-02-08 NOTE — Progress Notes (Signed)
NAME:  Margaret [name], MRN:  [id number], DOB:  11/27/1970, LOS: 9 ADMISSION DATE:  01/29/2021, CONSULTATION DATE:  10/30 REFERRING MD:  Roderic Palau, REASON FOR CONSULT: pleural effusion  History of Present Illness:  Margaret [name] is a 50 y.o. female who is seen in consultation at the request of Dr. Roderic Palau for recommendations on further evaluation and management of pleural effusion.   Margaret [name], is a 49 y.o. female, who presented to the [facility] ED on 10/29 with a chief complaint of difficulty with ambulation  They have a pertinent past medical history of ovarian cancer, hep B carrier, intelectual disability, TB (childhood), currently lives in group home.  ED course was notable for workup for a worsening cough which had been present for two days, CT chest demonstrated a large partially loculated right pleural effusion with atelectasis of the right upper lobe. She was also found to have a temp of 100.9, WBC 31.9, HBG 9.3.  She was admitted to the hopsitalist service and treatment was initiated for CAP. She was initially started on doxy but is now on Ceftriaxone and azithromycin   Appears that the patient was accepted to [facility] service.   Pertinent  Medical History  Ovarian mass,  hep B carrier, intelectual disability, TB (childhood), currently lives in group home  [facility] Events: Including procedures, antibiotic start and stop dates in addition to other pertinent events   10/29 Admit to [facility]. CT chest shows R pleural effusion. 10/30-31 TXR to [facility]. 11/1 -placement of right fifth intercostal space 20 French chest tube 11/1-11/2 - received two doses of intrapleural fibrinolytic 11/4 with chest tube removed, CT chest showed residual posterior effusion 11/5 - posterior pigtail catheter inserted and fibrinolytics restarted for enlarging right posterior effusion.  11/6 - no on room air. Waiting for transfer.   Interim History / Subjective:  Calm overnight. Now on room air.   Objective    Blood pressure (!) 93/54, pulse (!) 106, temperature 98.2 F (36.8 C), temperature source Oral, resp. rate (!) 32, height 5' 4"  (1.626 m), weight 48 kg, SpO2 100 %.        Intake/Output Summary (Last 24 hours) at 02/08/2021 0747 Last data filed at 02/08/2021 0700 Gross per 24 hour  Intake 1430 ml  Output 1470 ml  Net -40 ml    Filed Weights   02/06/21 0500 02/07/21 0500 02/08/21 0600  Weight: 51.9 kg 48.8 kg 48 kg    Examination:  Physical Exam Constitutional:      Appearance: She is underweight. She is ill-appearing.  HENT:     Head: Normocephalic and atraumatic.  Eyes:     Extraocular Movements: Extraocular movements intact.     Conjunctiva/sclera: Conjunctivae normal.  Cardiovascular:     Heart sounds: Normal heart sounds.  Pulmonary:     Effort: Pulmonary effort is normal.     Breath sounds: Normal breath sounds.  Chest:     Comments: Right pigtail in place draining turbid straw colored fluid Abdominal:     General: Abdomen is flat.  Musculoskeletal:     Right lower leg: No edema.     Left lower leg: No edema.  Neurological:     Mental Status: She is alert.     Cranial Nerves: Cranial nerves 2-12 are intact.     Motor: Motor function is intact.  Psychiatric:        Attention and Perception: Attention normal.        Mood and Affect: Affect is blunt.  Behavior: Behavior is cooperative.     Comments: Right hand akathesia     Ancillary tests  CXR 11/7 personally reviewed and shows decreased size of right pleural effusion.  Chest tube in place.   Persistent leukocytosis.   Assessment & Plan:   Recurrent aspiration pneumonia with left airspace disease and R empyema Right adrenal mass Schizophrenia, controlled  Hep B Normocytic Anemia Malnutrition-unspecified Elevated Ca-125, cystic lesion of ovary- will need OP f/u Distant hx of treated TB  Looking back through her scans, she had highly suspicious lung lesions on Oct 19 scan which regressed on  Nov 1 scan.  This would be less consistent with cancer.  The pleural fluid characteristics are c/w empyema and clinical history of migrating infiltrates would be more c/w aspiration events.  Continue Augmentin, prolonged course planned Posterior pigtail and lytic trial, not surgical candidate - Fibrinolytic again today. Antipsychotic meds as ordered. Ready for transfer.   Best Practice (right click and "Reselect all SmartList Selections" daily)   Best Practice (right click and "Reselect all SmartList Selections" daily)   Diet/type: Regular consistency (see orders) DVT prophylaxis: prophylactic heparin  GI prophylaxis: N/A Lines: N/A Foley:  N/A Code Status:  full code Last date of multidisciplinary goals of care discussion [per primary team ]   Kipp Brood, MD [facility] ICU Physician [place]  Pager: [phone] Or Epic Secure Chat After hours: [phone].  02/08/2021, 8:06 AM      Prefer epic messenger for cross cover needs If after hours, please call E-link

## 2021-02-08 NOTE — Procedures (Signed)
Pleural Fibrinolytic Administration Procedure Note  Margaret [name]  [id number]  08/11/1970  Date:02/08/21  Time:8:51 AM   Provider Performing:Margaret [name]   Procedure: Pleural Fibrinolysis Subsequent day [phone])  Indication(s) Fibrinolysis of complicated pleural effusion  Consent Risks of the procedure as well as the alternatives and risks of each were explained to the patient and/or caregiver.  Consent for the procedure was obtained.   Anesthesia None   Time Out Verified patient identification, verified procedure, site/side was marked, verified correct patient position, special equipment/implants available, medications/allergies/relevant history reviewed, required imaging and test results available.   Sterile Technique Hand hygiene, gloves   Procedure Description Existing pleural catheter was cleaned and accessed in sterile manner.  36m of tPA in 30cc of saline and 548mof dornase in 30cc of sterile water were injected into pleural space using existing pleural catheter.  Catheter will be clamped for 1 hour and then placed back to suction.   Complications/Tolerance None; patient tolerated the procedure well.  EBL None   Specimen(s) None   Margaret [name]  Pulmonary & Critical Care  See Amion for personal pager PCCM on call pager [phone] 7pm. Please call Elink 7p-7a. [phone]/10/2020 8:51 AM

## 2021-02-08 NOTE — Progress Notes (Signed)
Patient alert and oriented.Calm, cooperative and redirectable.Not pulling any lines and not trying to get out of the bed.Soft waistbelt discontinued.Will monitor closely.

## 2021-02-08 NOTE — Progress Notes (Signed)
PROGRESS NOTE  Margaret [name] OMB:[id number] DOB: 1970-08-13 DOA: 01/29/2021 PCP: Dettinger, Fransisca Kaufmann, MD   LOS: 9 days   Brief narrative:  Patient is a 50 years old female with past medical history of ovarian cancer, intellectual disability, history of TB in childhood from group home presented to hospital with worsening cough.  A CT scan done showed large partially loculated right pleural effusion with atelectasis of the right upper lobe.  She was febrile with a temperature of 100.9 F with a significant leukocytosis at 31.9.  Patient was initially admitted to the hospitalist service and was treated antibiotics for community-acquired pneumonia.  Subsequently patient was transferred to ICU service for pleural effusion.  Patient then underwent placement of right sided chest tube on 02/02/2021.  Assessment/Plan:  Principal Problem:   Acute respiratory failure with hypoxia (HCC) Active Problems:   Chronic hepatitis B (HCC)   Schizophrenia (HCC)   Complex ovarian cyst   Multiple pulmonary nodules   Community acquired pneumonia   Pleural effusion   Hypokalemia   Leukocytosis   Centrilobular emphysema (HCC)   Acute pulmonary edema (HCC)  Recurrent aspiration pneumonia with left airspace disease and right empyema. Patient initially received Rocephin and Zithromax which has been changed to Augmentin which will be continued.  Patient will have a pigtail catheter and received fibrinolytics as well.  Received  fibrinolytic treatment today too.  Patient is not a good surgical candidate.  Continue albuterol.  Patient still has significant leukocytosis.  Right pleural fluid showed no growth so far.  Right adrenal mass Will need to continue to monitor as outpatient  Schizophrenia Continue chlorpromazine, benztropine.  Patient received Precedex for agitation during hospitalization.  Continue fluoxetine.  Hypokalemia Improved, last blood work 3 days back.  Check BMP in AM.  Normocytic anemia  likely secondary to chronic disease.  Hemoglobin of 7.3 at this time.  Elevated CA125 with cystic lesion of ovary Will need outpatient follow-up with gynecology.  Underweight.  Dietitian on board.  Continue nutritional supplements   DVT prophylaxis: heparin injection 5,000 Units Start: 01/30/21 1400 SCDs Start: 01/30/21 1003   Code Status: Full code  Family Communication: Patient is  ward of the state.  Has legal guardian.  Status is: Inpatient  Remains inpatient appropriate because: Status post chest tube, empyema,  Consultants: PCCM  Procedures: Chest tube placement on 02/02/2021  Anti-infectives:  Augmentin  Anti-infectives (From admission, onward)    Start     Dose/Rate Route Frequency Ordered Stop   02/05/21 1000  amoxicillin-clavulanate (AUGMENTIN) 875-125 MG per tablet 1 tablet        1 tablet Oral Every 12 hours 02/05/21 0818 02/26/21 0959   02/03/21 1700  entecavir (BARACLUDE) tablet 0.5 mg        0.5 mg Oral Daily 02/03/21 1321     02/02/21 1030  cefTRIAXone (ROCEPHIN) 2 g in sodium chloride 0.9 % 100 mL IVPB  Status:  Discontinued        2 g 200 mL/hr over 30 Minutes Intravenous Every 24 hours 02/02/21 0941 02/05/21 0818   02/01/21 1100  Ampicillin-Sulbactam (UNASYN) 3 g in sodium chloride 0.9 % 100 mL IVPB  Status:  Discontinued        3 g 200 mL/hr over 30 Minutes Intravenous Every 6 hours 02/01/21 1007 02/02/21 0941   01/31/21 0500  cefTRIAXone (ROCEPHIN) 2 g in sodium chloride 0.9 % 100 mL IVPB  Status:  Discontinued        2 g 200 mL/hr over 30  Minutes Intravenous Every 24 hours 01/30/21 1004 02/01/21 1007   01/30/21 1015  entecavir (BARACLUDE) tablet 0.5 mg  Status:  Discontinued       Note to Pharmacy: TAKE (1) TABLET BY MOUTH ONCE DAILY.     0.5 mg Oral Daily 01/30/21 1004 02/02/21 0925   01/30/21 0515  cefTRIAXone (ROCEPHIN) 2 g in sodium chloride 0.9 % 100 mL IVPB        2 g 200 mL/hr over 30 Minutes Intravenous  Once 01/30/21 0509 01/30/21 0615    01/30/21 0515  azithromycin (ZITHROMAX) 500 mg in sodium chloride 0.9 % 250 mL IVPB  Status:  Discontinued        500 mg 250 mL/hr over 60 Minutes Intravenous Every 24 hours 01/30/21 0509 02/02/21 1357   01/30/21 0315  doxycycline (VIBRA-TABS) tablet 100 mg        100 mg Oral  Once 01/30/21 0314 01/30/21 0328       Subjective: Today, patient was seen and examined at bedside.  Nursing staff reported that patient had not had a bowel movement.  Patient states that she feels sad but denies overt pain.  Patient lives at group home.    Objective: Vitals:   02/08/21 0700 02/08/21 0732  BP: (!) 93/54   Pulse: (!) 106   Resp: (!) 32   Temp:  98.2 F (36.8 C)  SpO2: 100%     Intake/Output Summary (Last 24 hours) at 02/08/2021 0738 Last data filed at 02/08/2021 0700 Gross per 24 hour  Intake 1430 ml  Output 1470 ml  Net -40 ml   Filed Weights   02/06/21 0500 02/07/21 0500 02/08/21 0600  Weight: 51.9 kg 48.8 kg 48 kg   Body mass index is 18.16 kg/m.   Physical Exam:  GENERAL: Patient thinly built, alert awake and communicative, HENT: No scleral pallor or icterus. Pupils equally reactive to light. Oral mucosa is moist NECK: is supple, no gross swelling noted. CHEST: Right-sided chest tube in place.  Breath sounds heard  CVS: S1 and S2 heard, no murmur. Regular rate and rhythm.  ABDOMEN: Soft, non-tender, bowel sounds are present. EXTREMITIES: No edema.  Akathesia of upper extremities CNS: Involuntary movement of upper extremities, alert awake communicative, SKIN: warm and dry without rashes.  Data Review: I have personally reviewed the following laboratory data and studies,  CBC: Recent Labs  Lab 02/04/21 0253 02/05/21 0552 02/06/21 0653 02/07/21 0127 02/08/21 0012  WBC 26.1* 25.1* 23.6* 19.1* 22.0*  NEUTROABS 20.5* 22.8*  --   --   --   HGB 7.6* 7.6* 7.3* 7.2* 8.1*  HCT 24.0* 24.7* 23.1* 24.0* 26.3*  MCV 85.4 86.4 86.5 87.6 87.1  PLT 336 322 330 341 418*   Basic  Metabolic Panel: Recent Labs  Lab 02/01/21 2146 02/02/21 0420 02/02/21 0426 02/02/21 0627 02/03/21 0133 02/04/21 0253 02/05/21 0552  NA 134*   < > 133* 134* 131* 133* 136  K 3.3*   < > 4.3 3.6 3.4* 3.8 3.7  CL 100  --  101  --  94* 102 104  CO2 24  --  24  --  28 26 28   GLUCOSE 159*  --  108*  --  141* 135* 125*  BUN 5*  --  5*  --  7 6 6   CREATININE 0.64  --  0.60  --  0.52 0.47 0.52  CALCIUM 7.6*  --  8.0*  --  7.6* 7.7* 8.1*   < > = values in this  interval not displayed.   Liver Function Tests: Recent Labs  Lab 02/01/21 2146  AST 28  ALT 25  ALKPHOS 94  BILITOT 0.4  PROT 5.3*  ALBUMIN 1.5*   No results for input(s): LIPASE, AMYLASE in the last 168 hours. No results for input(s): AMMONIA in the last 168 hours. Cardiac Enzymes: No results for input(s): CKTOTAL, CKMB, CKMBINDEX, TROPONINI in the last 168 hours. BNP (last 3 results) Recent Labs    02/01/21 2146  BNP 70.2    ProBNP (last 3 results) No results for input(s): PROBNP in the last 8760 hours.  CBG: Recent Labs  Lab 02/01/21 2305  GLUCAP 132*   Recent Results (from the past 240 hour(s))  Resp Panel by RT-PCR (Flu A&B, Covid) Nasopharyngeal Swab     Status: None   Collection Time: 01/30/21  3:14 AM   Specimen: Nasopharyngeal Swab; Nasopharyngeal(NP) swabs in vial transport medium  Result Value Ref Range Status   SARS Coronavirus 2 by RT PCR NEGATIVE NEGATIVE Final    Comment: (NOTE) SARS-CoV-2 target nucleic acids are NOT DETECTED.  The SARS-CoV-2 RNA is generally detectable in upper respiratory specimens during the acute phase of infection. The lowest concentration of SARS-CoV-2 viral copies this assay can detect is 138 copies/mL. A negative result does not preclude SARS-Cov-2 infection and should not be used as the sole basis for treatment or other patient management decisions. A negative result may occur with  improper specimen collection/handling, submission of specimen other than  nasopharyngeal swab, presence of viral mutation(s) within the areas targeted by this assay, and inadequate number of viral copies(<138 copies/mL). A negative result must be combined with clinical observations, patient history, and epidemiological information. The expected result is Negative.  Fact Sheet for Patients:  EntrepreneurPulse.com.au  Fact Sheet for Healthcare Providers:  IncredibleEmployment.be  This test is no t yet approved or cleared by the [place] FDA and  has been authorized for detection and/or diagnosis of SARS-CoV-2 by FDA under an Emergency Use Authorization (EUA). This EUA will remain  in effect (meaning this test can be used) for the duration of the COVID-19 declaration under Section 564(b)(1) of the Act, 21 U.S.C.section 360bbb-3(b)(1), unless the authorization is terminated  or revoked sooner.       Influenza A by PCR NEGATIVE NEGATIVE Final   Influenza B by PCR NEGATIVE NEGATIVE Final    Comment: (NOTE) The Xpert Xpress SARS-CoV-2/FLU/RSV plus assay is intended as an aid in the diagnosis of influenza from Nasopharyngeal swab specimens and should not be used as a sole basis for treatment. Nasal washings and aspirates are unacceptable for Xpert Xpress SARS-CoV-2/FLU/RSV testing.  Fact Sheet for Patients: EntrepreneurPulse.com.au  Fact Sheet for Healthcare Providers: IncredibleEmployment.be  This test is not yet approved or cleared by the [place] FDA and has been authorized for detection and/or diagnosis of SARS-CoV-2 by FDA under an Emergency Use Authorization (EUA). This EUA will remain in effect (meaning this test can be used) for the duration of the COVID-19 declaration under Section 564(b)(1) of the Act, 21 U.S.C. section 360bbb-3(b)(1), unless the authorization is terminated or revoked.  Performed at [facility], [address], [place], [place] [address]    Culture, blood (routine x 2)     Status: None   Collection Time: 01/30/21  6:58 AM   Specimen: BLOOD  Result Value Ref Range Status   Specimen Description BLOOD  Final   Special Requests NONE  Final   Culture   Final    NO  GROWTH 5 DAYS Performed at [facility], [address], [place], [place] [address]    Report Status 02/04/2021 FINAL  Final  Culture, blood (routine x 2)     Status: None   Collection Time: 01/30/21  7:13 AM   Specimen: BLOOD  Result Value Ref Range Status   Specimen Description BLOOD  Final   Special Requests NONE  Final   Culture   Final    NO GROWTH 5 DAYS Performed at [facility], [address] [place], [place] [address]    Report Status 02/04/2021 FINAL  Final  Respiratory (~20 pathogens) panel by PCR     Status: None   Collection Time: 02/01/21  9:50 PM   Specimen: Nasopharyngeal Swab; Respiratory  Result Value Ref Range Status   Adenovirus NOT DETECTED NOT DETECTED Final   Coronavirus 229E NOT DETECTED NOT DETECTED Final    Comment: (NOTE) The Coronavirus on the Respiratory Panel, DOES NOT test for the novel  Coronavirus (2019 nCoV)    Coronavirus HKU1 NOT DETECTED NOT DETECTED Final   Coronavirus NL63 NOT DETECTED NOT DETECTED Final   Coronavirus OC43 NOT DETECTED NOT DETECTED Final   Metapneumovirus NOT DETECTED NOT DETECTED Final   Rhinovirus / Enterovirus NOT DETECTED NOT DETECTED Final   Influenza A NOT DETECTED NOT DETECTED Final   Influenza B NOT DETECTED NOT DETECTED Final   Parainfluenza Virus 1 NOT DETECTED NOT DETECTED Final   Parainfluenza Virus 2 NOT DETECTED NOT DETECTED Final   Parainfluenza Virus 3 NOT DETECTED NOT DETECTED Final   Parainfluenza Virus 4 NOT DETECTED NOT DETECTED Final   Respiratory Syncytial Virus NOT DETECTED NOT DETECTED Final   Bordetella pertussis NOT DETECTED NOT DETECTED Final   Bordetella Parapertussis NOT DETECTED NOT DETECTED Final   Chlamydophila pneumoniae NOT DETECTED NOT DETECTED Final    Mycoplasma pneumoniae NOT DETECTED NOT DETECTED Final    Comment: Performed at [facility], [place] [address] [place], [place] [address]  MRSA Next Gen by PCR, Nasal     Status: None   Collection Time: 02/01/21 10:55 PM   Specimen: Nasal Mucosa; Nasal Swab  Result Value Ref Range Status   MRSA by PCR Next Gen NOT DETECTED NOT DETECTED Final    Comment: (NOTE) The GeneXpert MRSA Assay (FDA approved for NASAL specimens only), is one component of a comprehensive MRSA colonization surveillance program. It is not intended to diagnose MRSA infection nor to guide or monitor treatment for MRSA infections. Test performance is not FDA approved in patients less than 64 years old. Performed at [facility], [place] [address], [place], [place] [address]   Body fluid culture w Gram Stain     Status: None   Collection Time: 02/02/21  8:06 AM   Specimen: Pleural Fluid  Result Value Ref Range Status   Specimen Description PLEURAL FLUID  Final   Special Requests PLEURAL RIGHT  Final   Gram Stain   Final    FEW WBC PRESENT, PREDOMINANTLY PMN NO ORGANISMS SEEN    Culture   Final    NO GROWTH Performed at [facility], [place] [address], [place], [place] [address]    Report Status 02/05/2021 FINAL  Final     Studies: DG Chest Port 1 View  Result Date: 02/07/2021 CLINICAL DATA:  Empyema EXAM: PORTABLE CHEST 1 VIEW COMPARISON:  Chest radiograph from one day prior. FINDINGS: Stable right basilar pigtail chest tube. Fixation hardware in the left clavicle. Stable cardiomediastinal silhouette  with normal heart size. No pneumothorax. Stable basilar right pleural thickening/small effusion. Stable basilar left pleural thickening/small effusion. Patchy opacities throughout the left greater than right lungs, slightly improved. IMPRESSION: 1. No pneumothorax. Stable right basilar pigtail chest tube. 2. Stable bilateral basilar pleural thickening/small effusions. 3. Patchy opacities in the left greater  than right lungs, slightly improved. Electronically Signed   By: Ilona Sorrel M.D.   On: 02/07/2021 07:16   DG Chest Port 1 View  Result Date: 02/06/2021 CLINICAL DATA:  50 year old female with pulmonary disease, history of tuberculosis EXAM: PORTABLE CHEST 1 VIEW COMPARISON:  Plain film 11/17/2014, CT chest 01/30/2021, plain film 02/01/2021-02/05/2021 FINDINGS: Cardiomediastinal silhouette likely unchanged in size and contour, with the heart borders partially obscured by overlying lung/pleural disease. Interval placement of pigtail drainage catheter/pleural catheter on the right with decreased opacity at the right lung base. Pleuroparenchymal thickening persists with partial obscuration of the right hemidiaphragm persisting. Minor fissure thickening persists. Multifocal mixed interstitial and airspace disease, left greater than right, persisting and essentially unchanged. No pneumothorax. Surgical changes of left clavicle again noted. IMPRESSION: Interval placement of pigtail drainage catheter in the inferior right pleural space, with decreased pleural fluid and no complicating features. Essentially unchanged multifocal left greater than right interstitial and airspace opacity. Electronically Signed   By: Corrie Mckusick D.O.   On: 02/06/2021 12:00      Flora Lipps, MD  [facility] 02/08/2021  If 7PM-7AM, please contact night-coverage

## 2021-02-09 ENCOUNTER — Inpatient Hospital Stay (HOSPITAL_COMMUNITY): Payer: Medicare Other

## 2021-02-09 DIAGNOSIS — J869 Pyothorax without fistula: Secondary | ICD-10-CM

## 2021-02-09 DIAGNOSIS — Z9689 Presence of other specified functional implants: Secondary | ICD-10-CM | POA: Diagnosis not present

## 2021-02-09 DIAGNOSIS — J432 Centrilobular emphysema: Secondary | ICD-10-CM | POA: Diagnosis not present

## 2021-02-09 DIAGNOSIS — J9601 Acute respiratory failure with hypoxia: Secondary | ICD-10-CM | POA: Diagnosis not present

## 2021-02-09 DIAGNOSIS — J189 Pneumonia, unspecified organism: Secondary | ICD-10-CM | POA: Diagnosis not present

## 2021-02-09 DIAGNOSIS — J810 Acute pulmonary edema: Secondary | ICD-10-CM | POA: Diagnosis not present

## 2021-02-09 LAB — BASIC METABOLIC PANEL
Anion gap: 10 (ref 5–15)
Anion gap: 8 (ref 5–15)
BUN: 6 mg/dL (ref 6–20)
BUN: 7 mg/dL (ref 6–20)
CO2: 27 mmol/L (ref 22–32)
CO2: 31 mmol/L (ref 22–32)
Calcium: 8.4 mg/dL — ABNORMAL LOW (ref 8.9–10.3)
Calcium: 8.8 mg/dL — ABNORMAL LOW (ref 8.9–10.3)
Chloride: 98 mmol/L (ref 98–111)
Chloride: 97 mmol/L — ABNORMAL LOW (ref 98–111)
Creatinine, Ser: 0.41 mg/dL — ABNORMAL LOW (ref 0.44–1.00)
Creatinine, Ser: 0.53 mg/dL (ref 0.44–1.00)
GFR, Estimated: >60 mL/min (ref >60)
GFR, Estimated: >60 mL/min (ref >60)
Glucose, Bld: 178 mg/dL — ABNORMAL HIGH (ref 70–99)
Glucose, Bld: 164 mg/dL — ABNORMAL HIGH (ref 70–99)
Potassium: 4.1 mmol/L (ref 3.5–5.1)
Potassium: 3.9 mmol/L (ref 3.5–5.1)
Sodium: 135 mmol/L (ref 135–145)
Sodium: 136 mmol/L (ref 135–145)

## 2021-02-09 LAB — CBC
HCT: 25.9 % — ABNORMAL LOW (ref 36.0–46.0)
HCT: 27.1 % — ABNORMAL LOW (ref 36.0–46.0)
Hemoglobin: 7.8 g/dL — ABNORMAL LOW (ref 12.0–15.0)
Hemoglobin: 8.1 g/dL — ABNORMAL LOW (ref 12.0–15.0)
MCH: 26.2 pg (ref 26.0–34.0)
MCH: 26 pg (ref 26.0–34.0)
MCHC: 30.1 g/dL (ref 30.0–36.0)
MCHC: 29.9 g/dL — ABNORMAL LOW (ref 30.0–36.0)
MCV: 86.9 fL (ref 80.0–100.0)
MCV: 87.1 fL (ref 80.0–100.0)
Platelets: 465 10*3/uL — ABNORMAL HIGH (ref 150–400)
Platelets: 507 10*3/uL — ABNORMAL HIGH (ref 150–400)
RBC: 2.98 MIL/uL — ABNORMAL LOW (ref 3.87–5.11)
RBC: 3.11 MIL/uL — ABNORMAL LOW (ref 3.87–5.11)
RDW: 14.3 % (ref 11.5–15.5)
RDW: 14.4 % (ref 11.5–15.5)
WBC: 17.1 10*3/uL — ABNORMAL HIGH (ref 4.0–10.5)
WBC: 14.8 10*3/uL — ABNORMAL HIGH (ref 4.0–10.5)
nRBC: 0 % (ref 0.0–0.2)
nRBC: 0 % (ref 0.0–0.2)

## 2021-02-09 LAB — MAGNESIUM
Magnesium: 1.8 mg/dL (ref 1.7–2.4)
Magnesium: 2 mg/dL (ref 1.7–2.4)

## 2021-02-09 IMAGING — DX DG CHEST 1V PORT
1 series · 1 of 1 positions shown · non-contrast
Comparison: Prior chest radiographs [DATE] and earlier.

CLINICAL DATA: Pneumonia. Additional history provided: Pneumonia,
chest tube.

EXAM:
PORTABLE CHEST 1 VIEW

[chest]
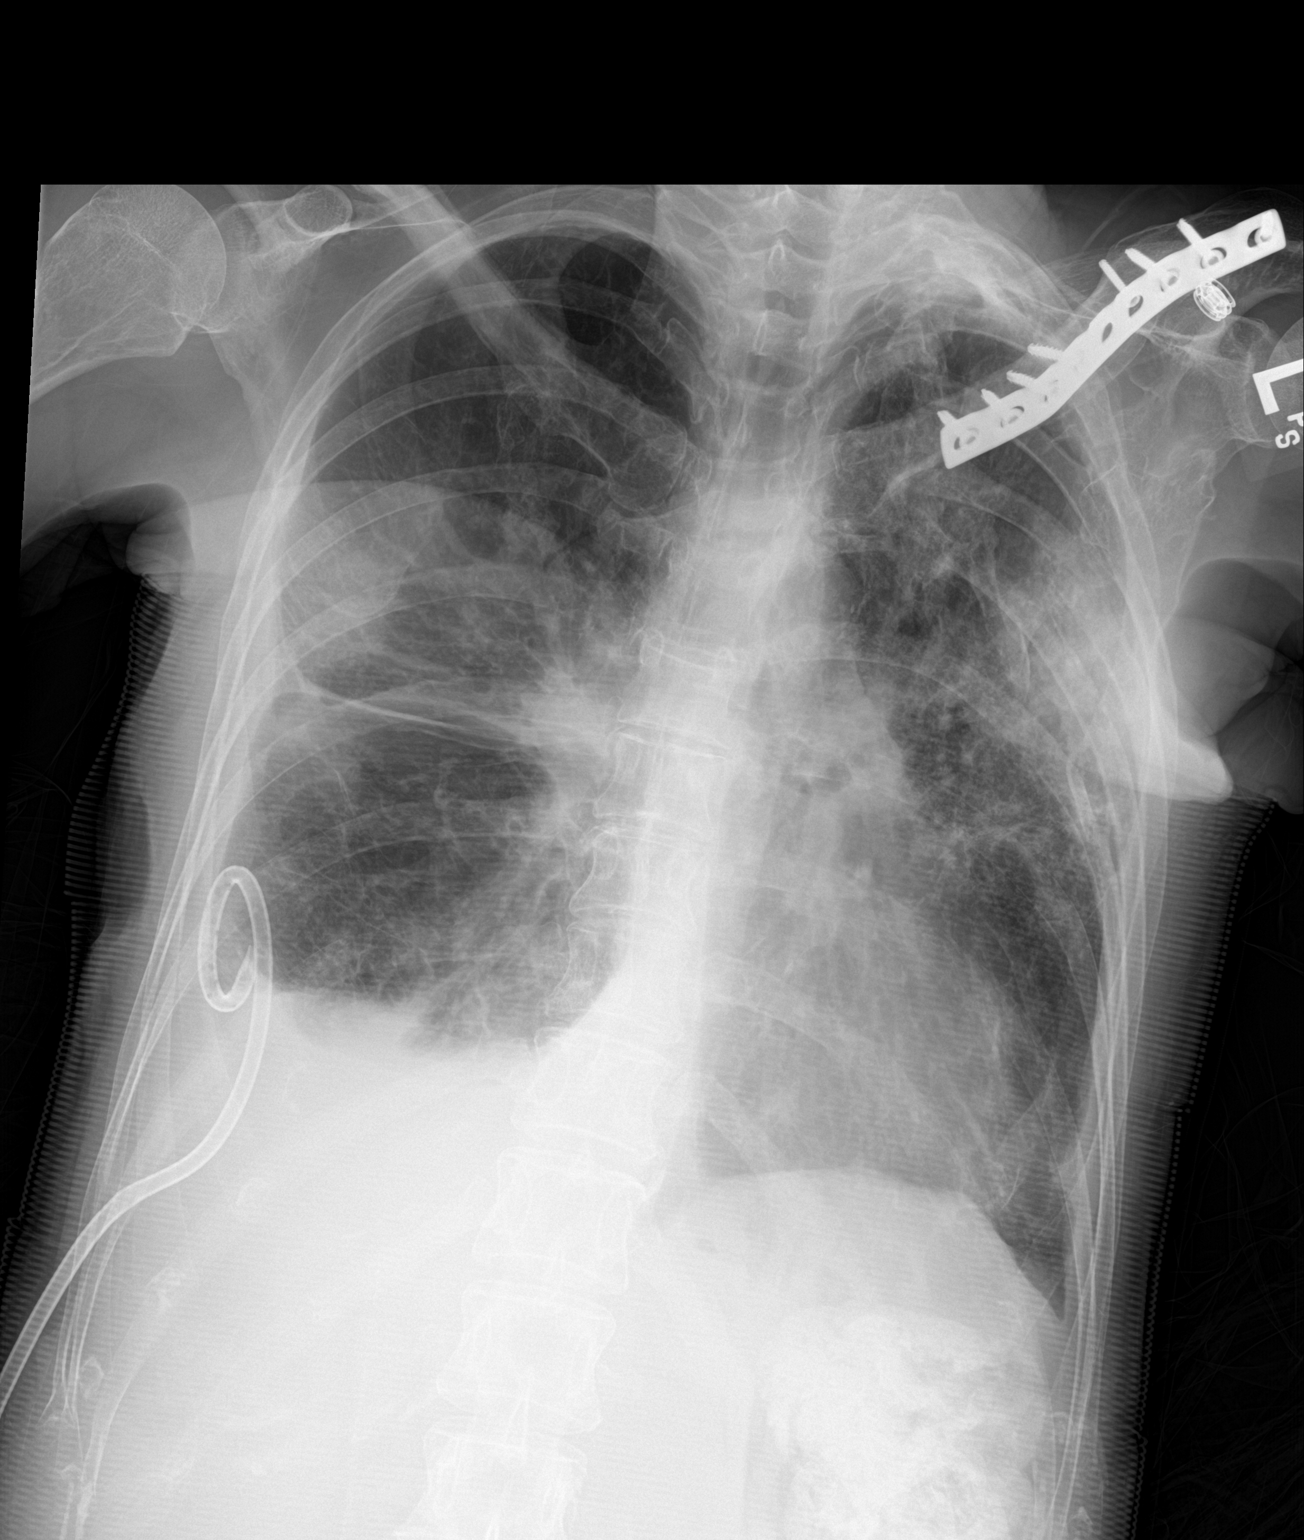

[1 of 1 positions shown; findings below may reference images not displayed]

FINDINGS: Unchanged position of a right basilar chest tube. Unchanged small
right pleural effusion. Persistent ill-defined opacity within the
right mid lung, which may reflect atelectasis or pneumonia.
Redemonstrated airspace opacity within the left upper to mid lung
field, similar to slightly improved, and compatible with pneumonia.
No sizable left pleural effusion. No evidence of pneumothorax. No
acute bony abnormality identified. Prior ORIF of the left clavicle.
IMPRESSION: Airspace disease within the left upper-to-mid lung field, similar to
slightly improved as compared to the prior chest radiograph of
[DATE]. Findings are compatible with the provided history of
pneumonia.

Similar appearance of ill-defined opacity within the right mid-lung,
which may reflect atelectasis or pneumonia.

Stable position of a right basilar chest tube with unchanged small
right pleural effusion. No evidence of pneumothorax.

## 2021-02-09 NOTE — Progress Notes (Incomplete)
Patient arrived to [facility] from [place]. Report received from Lelan Pons, [place]. Patient alert and oriented to person and time, disoriented to place and situation. Right posterior pigtail catheter in place. Dressing clean, dry, and intact. Patient call bell within reach. Will continue to monitor.

## 2021-02-09 NOTE — Progress Notes (Signed)
PROGRESS NOTE  Margaret [name] PYP:[id number] DOB: 01-Sep-1970 DOA: 01/29/2021 PCP: Dettinger, Fransisca Kaufmann, MD   LOS: 10 days   Brief narrative:  Patient is a 50 years old female with past medical history of ovarian cancer, intellectual disability, history of TB in childhood from group home presented to hospital with worsening cough.  A CT scan done showed large partially loculated right pleural effusion with atelectasis of the right upper lobe.  Patient was febrile with a temperature of 100.9 F with a significant leukocytosis at 31.9.  Patient was initially admitted to the hospitalist service and was treated antibiotics for community-acquired pneumonia.  Subsequently, patient was transferred to ICU service for pleural effusion.  Patient then underwent placement of right sided chest tube on 02/02/2021.  Patient was subsequently considered stable for transfer out of the ICU.  Assessment/Plan:  Principal Problem:   Acute respiratory failure with hypoxia (HCC) Active Problems:   Chronic hepatitis B (HCC)   Schizophrenia (HCC)   Complex ovarian cyst   Multiple pulmonary nodules   Community acquired pneumonia   Pleural effusion   Hypokalemia   Leukocytosis   Centrilobular emphysema (HCC)   Acute pulmonary edema (HCC)  Recurrent aspiration pneumonia with left airspace disease and right empyema. Patient initially received Rocephin and Zithromax which has been changed to Augmentin which will be continued.  On pigtail catheter and received fibrinolytics.  Patient is not a good surgical candidate.  Continue albuterol.  Patient still has significant leukocytosis.  Right pleural fluid showed no growth so far.  Right adrenal mass Will need to monitor as outpatient  Schizophrenia Continue chlorpromazine, benztropine.  Patient received Precedex for agitation during hospitalization.  Continue fluoxetine.  Hypokalemia Potassium of 4.1 today.  Normocytic anemia likely secondary to chronic disease.   Hemoglobin of 7.8 at this time.  Elevated CA125 with cystic lesion of ovary Will need outpatient follow-up with gynecology.  Underweight.  Dietitian on board.  Continue nutritional supplements   DVT prophylaxis: heparin injection 5,000 Units Start: 01/30/21 1400 SCDs Start: 01/30/21 1003   Code Status: Full code  Family Communication: Patient is  ward of the state.  Has legal guardian.  Status is: Inpatient  Remains inpatient appropriate because: Status post chest tube, empyema, patient is from group home  Consultants: PCCM  Procedures: Chest tube placement on 02/02/2021  Anti-infectives:  Augmentin  Subjective: Today, he was seen and examined at bedside.  No interval complaints reported by the nursing staff.   Objective: Vitals:   02/09/21 0918 02/09/21 1015  BP: (!) 89/55 95/61  Pulse: (!) 105 (!) 106  Resp:  18  Temp:  99.6 F (37.6 C)  SpO2:  99%    Intake/Output Summary (Last 24 hours) at 02/09/2021 1036 Last data filed at 02/09/2021 0540 Gross per 24 hour  Intake 240 ml  Output 450 ml  Net -210 ml    Filed Weights   02/06/21 0500 02/07/21 0500 02/08/21 0600  Weight: 51.9 kg 48.8 kg 48 kg   Body mass index is 18.16 kg/m.   Physical Exam:  GENERAL: Patient is thinly built, mildly sleepy today HENT: No scleral pallor or icterus. Pupils equally reactive to light. Oral mucosa is moist NECK: is supple, no gross swelling noted. CHEST: Right-sided chest tube in place.  CVS: S1 and S2 heard, no murmur. Regular rate and rhythm.  ABDOMEN: Soft, non-tender, bowel sounds are present. EXTREMITIES: No edema.  Akathesia of upper extremities CNS: Involuntary movement of upper extremities,  mildly sleepy today. SKIN: warm  and dry without rashes.  Data Review: I have personally reviewed the following laboratory data and studies,  CBC: Recent Labs  Lab 02/04/21 0253 02/05/21 0552 02/06/21 0653 02/07/21 0127 02/08/21 0012 02/09/21 0152  WBC 26.1* 25.1*  23.6* 19.1* 22.0* 17.1*  NEUTROABS 20.5* 22.8*  --   --   --   --   HGB 7.6* 7.6* 7.3* 7.2* 8.1* 7.8*  HCT 24.0* 24.7* 23.1* 24.0* 26.3* 25.9*  MCV 85.4 86.4 86.5 87.6 87.1 86.9  PLT 336 322 330 341 418* 465*    Basic Metabolic Panel: Recent Labs  Lab 02/03/21 0133 02/04/21 0253 02/05/21 0552 02/09/21 0152  NA 131* 133* 136 135  K 3.4* 3.8 3.7 4.1  CL 94* 102 104 98  CO2 28 26 28 27   GLUCOSE 141* 135* 125* 178*  BUN 7 6 6 6   CREATININE 0.52 0.47 0.52 0.41*  CALCIUM 7.6* 7.7* 8.1* 8.4*  MG  --   --   --  1.8    Liver Function Tests: No results for input(s): AST, ALT, ALKPHOS, BILITOT, PROT, ALBUMIN in the last 168 hours.  No results for input(s): LIPASE, AMYLASE in the last 168 hours. No results for input(s): AMMONIA in the last 168 hours. Cardiac Enzymes: No results for input(s): CKTOTAL, CKMB, CKMBINDEX, TROPONINI in the last 168 hours. BNP (last 3 results) Recent Labs    02/01/21 2146  BNP 70.2     ProBNP (last 3 results) No results for input(s): PROBNP in the last 8760 hours.  CBG: No results for input(s): GLUCAP in the last 168 hours.  Recent Results (from the past 240 hour(s))  Respiratory (~20 pathogens) panel by PCR     Status: None   Collection Time: 02/01/21  9:50 PM   Specimen: Nasopharyngeal Swab; Respiratory  Result Value Ref Range Status   Adenovirus NOT DETECTED NOT DETECTED Final   Coronavirus 229E NOT DETECTED NOT DETECTED Final    Comment: (NOTE) The Coronavirus on the Respiratory Panel, DOES NOT test for the novel  Coronavirus (2019 nCoV)    Coronavirus HKU1 NOT DETECTED NOT DETECTED Final   Coronavirus NL63 NOT DETECTED NOT DETECTED Final   Coronavirus OC43 NOT DETECTED NOT DETECTED Final   Metapneumovirus NOT DETECTED NOT DETECTED Final   Rhinovirus / Enterovirus NOT DETECTED NOT DETECTED Final   Influenza A NOT DETECTED NOT DETECTED Final   Influenza B NOT DETECTED NOT DETECTED Final   Parainfluenza Virus 1 NOT DETECTED NOT  DETECTED Final   Parainfluenza Virus 2 NOT DETECTED NOT DETECTED Final   Parainfluenza Virus 3 NOT DETECTED NOT DETECTED Final   Parainfluenza Virus 4 NOT DETECTED NOT DETECTED Final   Respiratory Syncytial Virus NOT DETECTED NOT DETECTED Final   Bordetella pertussis NOT DETECTED NOT DETECTED Final   Bordetella Parapertussis NOT DETECTED NOT DETECTED Final   Chlamydophila pneumoniae NOT DETECTED NOT DETECTED Final   Mycoplasma pneumoniae NOT DETECTED NOT DETECTED Final    Comment: Performed at [facility], [place]. [address], [place], [place] [address]  MRSA Next Gen by PCR, Nasal     Status: None   Collection Time: 02/01/21 10:55 PM   Specimen: Nasal Mucosa; Nasal Swab  Result Value Ref Range Status   MRSA by PCR Next Gen NOT DETECTED NOT DETECTED Final    Comment: (NOTE) The GeneXpert MRSA Assay (FDA approved for NASAL specimens only), is one component of a comprehensive MRSA colonization surveillance program. It is not intended to diagnose MRSA infection nor to guide  or monitor treatment for MRSA infections. Test performance is not FDA approved in patients less than 73 years old. Performed at [facility], [place] [address] [place], [place] [address]   Body fluid culture w Gram Stain     Status: None   Collection Time: 02/02/21  8:06 AM   Specimen: Pleural Fluid  Result Value Ref Range Status   Specimen Description PLEURAL FLUID  Final   Special Requests PLEURAL RIGHT  Final   Gram Stain   Final    FEW WBC PRESENT, PREDOMINANTLY PMN NO ORGANISMS SEEN    Culture   Final    NO GROWTH Performed at [facility], [place] [address], [place], [place] [address]    Report Status 02/05/2021 FINAL  Final      Studies: DG Chest 1 View  Result Date: 02/08/2021 CLINICAL DATA:  Empyema with chest tube in place EXAM: CHEST  1 VIEW COMPARISON:  Prior chest x-ray 02/07/2021 FINDINGS: Right basilar pigtail thoracostomy tube in unchanged position. Small amount of residual  pleural thickening versus pleural fluid persists. Increasing patchy airspace opacities in the periphery of the right lung. Persistent patchy airspace opacities in the left upper and mid lung without significant interval change. No pneumothorax. No acute osseous abnormality. Hardware consistent with prior ORIF of left clavicle and left humerus fractures remains in place. IMPRESSION: 1. Increasing patchy airspace opacities in the periphery of the right mid lung concerning for new atelectasis or pneumonia. 2. Stable and satisfactory position of right basilar pigtail thoracostomy tube. 3. Persistent advanced airspace opacities throughout the left mid and upper lung consistent with known multifocal pneumonia. Electronically Signed   By: Jacqulynn Cadet M.D.   On: 02/08/2021 08:14   DG ESOPHAGUS W SINGLE CM (SOL OR THIN BA)  Result Date: 02/08/2021 CLINICAL DATA:  Difficulty swallowing food. EXAM: ESOPHAGUS/BARIUM SWALLOW/TABLET STUDY TECHNIQUE: Single contrast examination was performed using thin liquid barium. This exam was performed by Rushie Nyhan, and was supervised and interpreted by Lorin Picket, MD. FLUOROSCOPY TIME:  Radiation Exposure Index (as provided by the fluoroscopic device): If the device does not provide the exposure index: Fluoroscopy Time:  2 minutes 12 seconds. Number of Acquired Images:  None. COMPARISON:  NONE. FINDINGS: Swallowing: Appears normal. No vestibular penetration or aspiration seen. Pharynx: Unremarkable. Esophagus: There may be mild esophageal fold thickening. Esophageal motility: Within normal limits. Hiatal Hernia: None. Gastroesophageal reflux: None. Ingested 80m barium tablet: A 13 mm barium tablet would not pass beyond the gastroesophageal junction, suggesting very minimal narrowing. No high-grade stricture. Other: None. IMPRESSION: 1. A 13 mm barium tablet would not pass beyond the gastroesophageal junction, suggesting very minimal narrowing. No high-grade stricture.  2. Probable mild esophageal fold thickening. Electronically Signed   By: MLorin PicketM.D.   On: 02/08/2021 10:24      LFlora Lipps MD  [facility] 02/09/2021  If 7PM-7AM, please contact night-coverage

## 2021-02-09 NOTE — Care Management Important Message (Signed)
Important Message  Patient Details  Name: Margaret [name] MRN: [id number] Date of Birth: Nov 21, 1970   Medicare Important Message Given:  Yes     Orbie Pyo 02/09/2021, 2:38 PM

## 2021-02-09 NOTE — Progress Notes (Signed)
Patient's vital sign MEWs turned to yellow. Discussed patient's conditions with Charge Nurse.  No discomfort or distress noted. Will continue monitor.

## 2021-02-09 NOTE — Progress Notes (Signed)
   02/08/21 2148  Assess: MEWS Score  Temp 98.2 F (36.8 C)  BP (!) 99/51  Pulse Rate (!) 106  Resp 20  Level of Consciousness Alert  SpO2 98 %  O2 Device Room Air  Assess: MEWS Score  MEWS Temp 0  MEWS Systolic 1  MEWS Pulse 1  MEWS RR 0  MEWS LOC 0  MEWS Score 2  MEWS Score Color Yellow  Assess: if the MEWS score is Yellow or Red  Were vital signs taken at a resting state? Yes  Focused Assessment Change from prior assessment (see assessment flowsheet)  Early Detection of Sepsis Score *See Row Information* Low  MEWS guidelines implemented *See Row Information* Yes  Treat  MEWS Interventions Escalated (See documentation below) (Notified MD)  Take Vital Signs  Increase Vital Sign Frequency  Yellow: Q 2hr X 2 then Q 4hr X 2, if remains yellow, continue Q 4hrs  Escalate  MEWS: Escalate Yellow: discuss with charge nurse/RN and consider discussing with provider and RRT  Notify: Charge Nurse/RN  Name of Charge Nurse/RN Notified Nellie, RN  Date Charge Nurse/RN Notified 02/08/21  Time Charge Nurse/RN Notified 2159  Notify: Provider  Provider Name/Title J. Olena Heckle, NP Clarene Essex contacted E-Link. awaiting call back or new orders)  Date Provider Notified 02/08/21  Time Provider Notified 2159  Notification Type Page  Notification Reason Change in status  Provider response No new orders  Date of Provider Response 02/08/21  Time of Provider Response 2238  Document  Progress note created (see row info) Yes

## 2021-02-09 NOTE — Progress Notes (Signed)
Mobility Specialist Progress Note:   02/09/21 1130  Mobility  Activity Sat and stood x 3 (x1)  Level of Assistance Independent  Assistive Device None  Mobility Response Tolerated poorly  Mobility performed by Mobility specialist  $Mobility charge 1 Mobility   Pt stood up without assist, then impulsively sat down and refused to continue mobility.   Nelta Numbers Mobility Specialist  Phone [phone]

## 2021-02-09 NOTE — Progress Notes (Signed)
NAME:  Margaret [name], MRN:  [id number], DOB:  [birth year], LOS: 36 ADMISSION DATE:  01/29/2021, CONSULTATION DATE:  10/30 REFERRING MD:  Roderic Palau, REASON FOR CONSULT: pleural effusion  History of Present Illness:  Margaret [name] is a 50 y.o. female who is seen in consultation at the request of Dr. Roderic Palau for recommendations on further evaluation and management of pleural effusion.   Margaret [name], is a 50 y.o. female, who presented to the [facility] on 10/29 with a chief complaint of difficulty with ambulation  They have a pertinent past medical history of ovarian cancer, hep B carrier, intelectual disability, TB (childhood), currently lives in group home.  ED course was notable for workup for a worsening cough which had been present for two days, CT chest demonstrated a large partially loculated right pleural effusion with atelectasis of the right upper lobe. She was also found to have a temp of 100.9, WBC 31.9, HBG 9.3.  She was admitted to the hopsitalist service and treatment was initiated for CAP. She was initially started on doxy but is now on Ceftriaxone and azithromycin   Appears that the patient was accepted to [facility] service.   Pertinent  Medical History  Ovarian mass,  hep B carrier, intelectual disability, TB (childhood), currently lives in group home  [facility] Events: Including procedures, antibiotic start and stop dates in addition to other pertinent events   10/29 Admit to [facility]. CT chest shows R pleural effusion. 10/30-31 TXR to [facility]. 11/1 -placement of right fifth intercostal space 20 French chest tube 11/1-11/2 - received two doses of intrapleural fibrinolytic 11/4 with chest tube removed, CT chest showed residual posterior effusion 11/5 - posterior pigtail catheter inserted and fibrinolytics restarted for enlarging right posterior effusion.  11/6 - no on room air. Waiting for transfer.  02/09/2021 chest x-ray is improved  Interim History / Subjective:  Calm overnight.  Now on room air.   Objective   Blood pressure (!) 89/55, pulse (!) 105, temperature 98.2 F (36.8 C), temperature source Oral, resp. rate 20, height 5' 4"  (1.626 m), weight 48 kg, SpO2 98 %.        Intake/Output Summary (Last 24 hours) at 02/09/2021 0953 Last data filed at 02/09/2021 0540 Gross per 24 hour  Intake 240 ml  Output 550 ml  Net -310 ml    Filed Weights   02/06/21 0500 02/07/21 0500 02/08/21 0600  Weight: 51.9 kg 48.8 kg 48 kg    Examination: General: Sponsored female with obvious intellectual delay HEENT: MM pink/moist no JVD is appreciated Neuro: Will arouse with noxious stimuli CV: Heart sounds are regular PULM: Diminished breath sounds in the bases  GI: soft, bsx4 active  Extremities: warm/dry,  edema  Skin: no rashes or lesions     Chest x-ray 02/09/2021 appears to have a decreased and effusion. Ancillary tests  Chest x-ray 02/09/2021 reading pending   Recurrent aspiration pneumonia with left airspace disease and R empyema Right adrenal mass Schizophrenia, controlled  Hep B Normocytic Anemia Malnutrition-unspecified Elevated Ca-125, cystic lesion of ovary- will need OP f/u Distant hx of treated TB  Looking back through her scans, she had highly suspicious lung lesions on Oct 19 scan which regressed on Nov 1 scan.  This would be less consistent with cancer.  The pleural fluid characteristics are c/w empyema and clinical history of migrating infiltrates would be more c/w aspiration events.  Post lytics via chest tube 10/2020 approximately 800 cc out without any further drainage documented. Continue Augmentin prolonged  course as planned. Questionable repeat of fibrinolytics and timing of chest tube removal Antipsychotic medications as needed    Best Practice (right click and "Reselect all SmartList Selections" daily)   Best Practice (right click and "Reselect all SmartList Selections" daily)   Diet/type: Regular consistency (see orders) DVT  prophylaxis: prophylactic heparin  GI prophylaxis: N/A Lines: N/A Foley:  N/A Code Status:  full code Last date of multidisciplinary goals of care discussion [per primary team ]   Kipp Brood, MD [facility] ICU Physician [place]  Pager: [phone] Or Epic Secure Chat After hours: [phone].  02/09/2021, 9:53 AM      Prefer epic messenger for cross cover needs If after hours, please call E-link

## 2021-02-10 DIAGNOSIS — J189 Pneumonia, unspecified organism: Secondary | ICD-10-CM | POA: Diagnosis not present

## 2021-02-10 DIAGNOSIS — E43 Unspecified severe protein-calorie malnutrition: Secondary | ICD-10-CM | POA: Insufficient documentation

## 2021-02-10 DIAGNOSIS — J810 Acute pulmonary edema: Secondary | ICD-10-CM | POA: Diagnosis not present

## 2021-02-10 DIAGNOSIS — J432 Centrilobular emphysema: Secondary | ICD-10-CM | POA: Diagnosis not present

## 2021-02-10 DIAGNOSIS — J869 Pyothorax without fistula: Secondary | ICD-10-CM | POA: Diagnosis not present

## 2021-02-10 DIAGNOSIS — J9601 Acute respiratory failure with hypoxia: Secondary | ICD-10-CM | POA: Diagnosis not present

## 2021-02-10 MED ORDER — ADULT MULTIVITAMIN W/MINERALS CH
1.0000 | ORAL_TABLET | Freq: Every day | ORAL | Status: DC
  Administered 2021-02-10 – 2021-02-18 (×9): 1 via ORAL
  Filled 2021-02-10 (×9): qty 1

## 2021-02-10 NOTE — Progress Notes (Signed)
Nutrition Follow-up  DOCUMENTATION CODES:   Severe malnutrition in context of chronic illness, Underweight  INTERVENTION:   Ensure Enlive po TID, each supplement provides 350 kcal and 20 grams of protein  Multivitamin w/ minerals daily  Encourage good PO intake  NUTRITION DIAGNOSIS:   Severe Malnutrition related to chronic illness (met Ovarian cancer) as evidenced by severe muscle depletion, severe fat depletion, percent weight loss  GOAL:   Patient will meet greater than or equal to 90% of their needs - Progressing   MONITOR:   PO intake, Supplement acceptance, Weight trends  REASON FOR ASSESSMENT:   Malnutrition Screening Tool    ASSESSMENT:   Patient is an underweight 50 yo female with hx of schizophrenia who presents with ambulation difficulty. Community acquired pneumonia, large right sided PE (pending diagnostic  thoracentesis), right adnexal mass concerning for malignancy per MD.  11/01 Chest tube placed  11/07 transferred out of [place]  Pt resting in bed, lunch tray at bedside.   Pt reports that she is eating well and that she loves spaghetti. Pt reports that she is drinking the Ensure shakes. PLDN observed pt breakfast tray in room, > 75% was consumed.  Per EMR, pt intake includes: 11/5 - Breakfast 50%, Lunch 25% 11/6 - Breakfast 10%, Lunch 50%, Dinner 80% 11/7 - Breakfast 50% 11/8 - Breakfast 0%, Lunch 10%  Per EMR, pt was 56.7 kg at the end of April 2022 and currently weights 48 kg, this would be a 15.3% weight loss within 7 months. This weight loss is clinically significant for time frame.  Pt requesting shower and another soda; PLDN let RN know. PLDN left pt upright in bed, eating lunch.   Medications reviewed and include: Augmentin, Entecavir, Remeron, Miralax, Senokot Labs reviewed.  NUTRITION - FOCUSED PHYSICAL EXAM:  Flowsheet Row Most Recent Value  Orbital Region Severe depletion  Upper Arm Region Severe depletion  Thoracic and Lumbar Region  Severe depletion  Buccal Region Severe depletion  Temple Region Severe depletion  Clavicle Bone Region Severe depletion  Clavicle and Acromion Bone Region Severe depletion  Scapular Bone Region Severe depletion  Dorsal Hand Severe depletion  Patellar Region Severe depletion  Anterior Thigh Region Severe depletion  Posterior Calf Region Severe depletion  Edema (RD Assessment) None  Hair Reviewed  Eyes Reviewed  Mouth Reviewed  Skin Reviewed  Nails Reviewed       Diet Order:   Diet Order             Diet regular Room service appropriate? Yes; Fluid consistency: Thin  Diet effective now                   EDUCATION NEEDS:   Not appropriate for education at this time  Skin:  Skin Assessment: Reviewed RN Assessment  Last BM:  02/09/2021  Height:   Ht Readings from Last 1 Encounters:  02/01/21 _0  (1.626 m)    Weight:   Wt Readings from Last 1 Encounters:  02/08/21 48 kg    Ideal Body Weight:  54.6 kg  BMI:  Body mass index is 18.16 kg/m.  Estimated Nutritional Needs:   Kcal:  1700-1900  Protein:  85-100 grams  Fluid:  > 1.7 L    Chrisann Melaragno BS, PLDN Clinical Dietitian See AMiON for contact information.

## 2021-02-10 NOTE — Progress Notes (Signed)
Mobility Specialist Progress Note:   02/10/21 1110  Mobility  Activity Ambulated in room;Ambulated to bathroom  Level of Assistance Contact guard assist, steadying assist  Assistive Device Other (Comment) (HHA)  Distance Ambulated (ft) 20 ft  Mobility Out of bed for toileting  Mobility Response Tolerated well  Mobility performed by Mobility specialist  $Mobility charge 1 Mobility   Pt asx during ambulation. Back in bed, with bed alarm on.   Nelta Numbers Mobility Specialist  Phone [phone]

## 2021-02-10 NOTE — Progress Notes (Signed)
PROGRESS NOTE  Margaret [name] IRW:[id number] DOB: July 08, 1970 DOA: 01/29/2021 PCP: Dettinger, Fransisca Kaufmann, MD   LOS: 11 days   Brief narrative:  Patient is a 50 years old female with past medical history of ovarian cancer, intellectual disability, history of TB in childhood from group home presented to hospital with worsening cough.  A CT scan done showed large partially loculated right pleural effusion with atelectasis of the right upper lobe.  Patient was febrile with a temperature of 100.9 F with a significant leukocytosis at 31.9.  Patient was initially admitted to the hospitalist service and was treated antibiotics for community-acquired pneumonia.  Subsequently, patient was transferred to ICU service for pleural effusion.  Patient then underwent placement of right sided chest tube on 02/02/2021.  Patient was then subsequently considered stable for transfer out of the ICU.  Assessment/Plan:  Principal Problem:   Acute respiratory failure with hypoxia (HCC) Active Problems:   Chronic hepatitis B (HCC)   Schizophrenia (HCC)   Complex ovarian cyst   Multiple pulmonary nodules   Community acquired pneumonia   Pleural effusion   Hypokalemia   Leukocytosis   Centrilobular emphysema (HCC)   Acute pulmonary edema (HCC)  Recurrent aspiration pneumonia with left airspace disease and right empyema. Patient initially received Rocephin and Zithromax which has been changed to Augmentin which will be continued.  On pigtail catheter and received fibrinolytics.  Patient is not a good surgical candidate.  Continue albuterol bronchodilator.  Patient still has significant leukocytosis.  WBC at 14.8.  Right pleural fluid showed no growth so far.  Right adrenal mass Will need to monitor as outpatient  Schizophrenia Continue chlorpromazine, benztropine.  Patient received Precedex for agitation during hospitalization.  Continue fluoxetine.  Hypokalemia Potassium of 3.9 today.  Normocytic anemia  likely secondary to chronic disease.  Hemoglobin of 8.1 at this time.  Elevated CA125 with cystic lesion of ovary Will need outpatient follow-up with gynecology.  Underweight.  Dietitian on board.  Continue nutritional supplements   DVT prophylaxis: heparin injection 5,000 Units Start: 01/30/21 1400 SCDs Start: 01/30/21 1003   Code Status: Full code  Family Communication: Patient is  ward of the state.  Has legal guardian.  Status is: Inpatient  Remains inpatient appropriate because: Status post chest tube, empyema, patient is from group home  Consultants: PCCM  Procedures: Chest tube placement on 02/02/2021  Anti-infectives:  Augmentin  Subjective: Today, patient was seen and examined at bedside.  Patient states that she is okay.  Denies overt pain.  States that she wants to cola.  Denies obvious nausea vomiting fever.  Objective: Vitals:   02/10/21 0823 02/10/21 0841  BP: (!) 88/65 91/62  Pulse: 98   Resp: 14   Temp: 98.6 F (37 C)   SpO2: 95%     Intake/Output Summary (Last 24 hours) at 02/10/2021 0920 Last data filed at 02/10/2021 0646 Gross per 24 hour  Intake 1040 ml  Output 1238 ml  Net -198 ml    Filed Weights   02/06/21 0500 02/07/21 0500 02/08/21 0600  Weight: 51.9 kg 48.8 kg 48 kg   Body mass index is 18.16 kg/m.   Physical Exam:  GENERAL: Patient is thinly built, alert awake and communicative HENT: No scleral pallor or icterus. Pupils equally reactive to light. Oral mucosa is moist NECK: is supple, no gross swelling noted. CHEST: Right-sided chest tube in place.  CVS: S1 and S2 heard, no murmur. Regular rate and rhythm.  ABDOMEN: Soft, non-tender, bowel sounds are present. EXTREMITIES:  No edema.  Akathesia of upper extremities CNS: Alert awake and communicative, akathisia. SKIN: warm and dry without rashes.  Data Review: I have personally reviewed the following laboratory data and studies,  CBC: Recent Labs  Lab 02/04/21 0253  02/05/21 0552 02/06/21 0653 02/07/21 0127 02/08/21 0012 02/09/21 0152 02/09/21 1109  WBC 26.1* 25.1* 23.6* 19.1* 22.0* 17.1* 14.8*  NEUTROABS 20.5* 22.8*  --   --   --   --   --   HGB 7.6* 7.6* 7.3* 7.2* 8.1* 7.8* 8.1*  HCT 24.0* 24.7* 23.1* 24.0* 26.3* 25.9* 27.1*  MCV 85.4 86.4 86.5 87.6 87.1 86.9 87.1  PLT 336 322 330 341 418* 465* 507*    Basic Metabolic Panel: Recent Labs  Lab 02/04/21 0253 02/05/21 0552 02/09/21 0152 02/09/21 1109  NA 133* 136 135 136  K 3.8 3.7 4.1 3.9  CL 102 104 98 97*  CO2 26 28 27 31   GLUCOSE 135* 125* 178* 164*  BUN 6 6 6 7   CREATININE 0.47 0.52 0.41* 0.53  CALCIUM 7.7* 8.1* 8.4* 8.8*  MG  --   --  1.8 2.0    Liver Function Tests: No results for input(s): AST, ALT, ALKPHOS, BILITOT, PROT, ALBUMIN in the last 168 hours.  No results for input(s): LIPASE, AMYLASE in the last 168 hours. No results for input(s): AMMONIA in the last 168 hours. Cardiac Enzymes: No results for input(s): CKTOTAL, CKMB, CKMBINDEX, TROPONINI in the last 168 hours. BNP (last 3 results) Recent Labs    02/01/21 2146  BNP 70.2     ProBNP (last 3 results) No results for input(s): PROBNP in the last 8760 hours.  CBG: No results for input(s): GLUCAP in the last 168 hours.  Recent Results (from the past 240 hour(s))  Respiratory (~20 pathogens) panel by PCR     Status: None   Collection Time: 02/01/21  9:50 PM   Specimen: Nasopharyngeal Swab; Respiratory  Result Value Ref Range Status   Adenovirus NOT DETECTED NOT DETECTED Final   Coronavirus 229E NOT DETECTED NOT DETECTED Final    Comment: (NOTE) The Coronavirus on the Respiratory Panel, DOES NOT test for the novel  Coronavirus (2019 nCoV)    Coronavirus HKU1 NOT DETECTED NOT DETECTED Final   Coronavirus NL63 NOT DETECTED NOT DETECTED Final   Coronavirus OC43 NOT DETECTED NOT DETECTED Final   Metapneumovirus NOT DETECTED NOT DETECTED Final   Rhinovirus / Enterovirus NOT DETECTED NOT DETECTED Final    Influenza A NOT DETECTED NOT DETECTED Final   Influenza B NOT DETECTED NOT DETECTED Final   Parainfluenza Virus 1 NOT DETECTED NOT DETECTED Final   Parainfluenza Virus 2 NOT DETECTED NOT DETECTED Final   Parainfluenza Virus 3 NOT DETECTED NOT DETECTED Final   Parainfluenza Virus 4 NOT DETECTED NOT DETECTED Final   Respiratory Syncytial Virus NOT DETECTED NOT DETECTED Final   Bordetella pertussis NOT DETECTED NOT DETECTED Final   Bordetella Parapertussis NOT DETECTED NOT DETECTED Final   Chlamydophila pneumoniae NOT DETECTED NOT DETECTED Final   Mycoplasma pneumoniae NOT DETECTED NOT DETECTED Final    Comment: Performed at [facility], [place]. [address] [place],  [address]  MRSA Next Gen by PCR, Nasal     Status: None   Collection Time: 02/01/21 10:55 PM   Specimen: Nasal Mucosa; Nasal Swab  Result Value Ref Range Status   MRSA by PCR Next Gen NOT DETECTED NOT DETECTED Final    Comment: (NOTE) The GeneXpert MRSA Assay (FDA approved for NASAL  specimens only), is one component of a comprehensive MRSA colonization surveillance program. It is not intended to diagnose MRSA infection nor to guide or monitor treatment for MRSA infections. Test performance is not FDA approved in patients less than 4 years old. Performed at [facility], [place] [address], [place], [place] [address]   Body fluid culture w Gram Stain     Status: None   Collection Time: 02/02/21  8:06 AM   Specimen: Pleural Fluid  Result Value Ref Range Status   Specimen Description PLEURAL FLUID  Final   Special Requests PLEURAL RIGHT  Final   Gram Stain   Final    FEW WBC PRESENT, PREDOMINANTLY PMN NO ORGANISMS SEEN    Culture   Final    NO GROWTH Performed at [facility], [place] [address], [place], [place] [address]    Report Status 02/05/2021 FINAL  Final      Studies: DG CHEST PORT 1 VIEW  Result Date: 02/09/2021 CLINICAL DATA:  Pneumonia. Additional history provided: Pneumonia, chest  tube. EXAM: PORTABLE CHEST 1 VIEW COMPARISON:  Prior chest radiographs 02/08/2021 and earlier. FINDINGS: Unchanged position of a right basilar chest tube. Unchanged small right pleural effusion. Persistent ill-defined opacity within the right mid lung, which may reflect atelectasis or pneumonia. Redemonstrated airspace opacity within the left upper to mid lung field, similar to slightly improved, and compatible with pneumonia. No sizable left pleural effusion. No evidence of pneumothorax. No acute bony abnormality identified. Prior ORIF of the left clavicle. IMPRESSION: Airspace disease within the left upper-to-mid lung field, similar to slightly improved as compared to the prior chest radiograph of 02/08/2021. Findings are compatible with the provided history of pneumonia. Similar appearance of ill-defined opacity within the right mid-lung, which may reflect atelectasis or pneumonia. Stable position of a right basilar chest tube with unchanged small right pleural effusion. No evidence of pneumothorax. Electronically Signed   By: Kellie Simmering D.O.   On: 02/09/2021 10:37   DG ESOPHAGUS W SINGLE CM (SOL OR THIN BA)  Result Date: 02/08/2021 CLINICAL DATA:  Difficulty swallowing food. EXAM: ESOPHAGUS/BARIUM SWALLOW/TABLET STUDY TECHNIQUE: Single contrast examination was performed using thin liquid barium. This exam was performed by Rushie Nyhan, and was supervised and interpreted by Lorin Picket, MD. FLUOROSCOPY TIME:  Radiation Exposure Index (as provided by the fluoroscopic device): If the device does not provide the exposure index: Fluoroscopy Time:  2 minutes 12 seconds. Number of Acquired Images:  None. COMPARISON:  NONE. FINDINGS: Swallowing: Appears normal. No vestibular penetration or aspiration seen. Pharynx: Unremarkable. Esophagus: There may be mild esophageal fold thickening. Esophageal motility: Within normal limits. Hiatal Hernia: None. Gastroesophageal reflux: None. Ingested 32m barium  tablet: A 13 mm barium tablet would not pass beyond the gastroesophageal junction, suggesting very minimal narrowing. No high-grade stricture. Other: None. IMPRESSION: 1. A 13 mm barium tablet would not pass beyond the gastroesophageal junction, suggesting very minimal narrowing. No high-grade stricture. 2. Probable mild esophageal fold thickening. Electronically Signed   By: MLorin PicketM.D.   On: 02/08/2021 10:24      LFlora Lipps MD  [facility] 02/10/2021  If 7PM-7AM, please contact night-coverage

## 2021-02-10 NOTE — Progress Notes (Signed)
NAME:  Margaret [name], MRN:  [id number], DOB:  Jul 16, 1970, LOS: 47 ADMISSION DATE:  01/29/2021, CONSULTATION DATE:  10/30 REFERRING MD:  Roderic Palau, REASON FOR CONSULT: pleural effusion  History of Present Illness:  Margaret [name] is a 50 y.o. female who is seen in consultation at the request of Dr. Roderic Palau for recommendations on further evaluation and management of pleural effusion.   Margaret [name], is a 50 y.o. female, who presented to the [facility] on 10/29 with a chief complaint of difficulty with ambulation  They have a pertinent past medical history of ovarian cancer, hep B carrier, intelectual disability, TB (childhood), currently lives in group home.  ED course was notable for workup for a worsening cough which had been present for two days, CT chest demonstrated a large partially loculated right pleural effusion with atelectasis of the right upper lobe. She was also found to have a temp of 100.9, WBC 31.9, HBG 9.3.  She was admitted to the hopsitalist service and treatment was initiated for CAP. She was initially started on doxy but is now on Ceftriaxone and azithromycin   Pertinent  Medical History  Ovarian mass,  hep B carrier, intelectual disability, TB (childhood), currently lives in group home  [facility] Events: Including procedures, antibiotic start and stop dates in addition to other pertinent events   10/29 Admit to [facility]. CT chest shows R pleural effusion. 10/30-31 TXR to [facility]. 11/1 -placement of right fifth intercostal space 20 French chest tube 11/1-11/2 - received two doses of intrapleural fibrinolytic 11/4 with chest tube removed, CT chest showed residual posterior effusion 11/5 - posterior pigtail catheter inserted and fibrinolytics restarted for enlarging right posterior effusion.  11/6 - no on room air. Waiting for transfer.  02/09/2021 chest x-ray is improved  Interim History / Subjective:  No overnight issues, remained afebrile On room air  Objective   Blood  pressure 100/67, pulse (!) 106, temperature 99.7 F (37.6 C), temperature source Oral, resp. rate 18, height 5' 4"  (1.626 m), weight 48 kg, SpO2 97 %.        Intake/Output Summary (Last 24 hours) at 02/10/2021 1653 Last data filed at 02/10/2021 0646 Gross per 24 hour  Intake 680 ml  Output 538 ml  Net 142 ml   Filed Weights   02/06/21 0500 02/07/21 0500 02/08/21 0600  Weight: 51.9 kg 48.8 kg 48 kg    Examination: Physical exam: General: Chronically ill-appearing female, lying on the bed HEENT: [place]/AT, eyes anicteric.  moist mucus membranes Neuro: Alert, awake following commands Chest: Reduced air entry at the bases bilaterally, no wheezes or rhonchi.  Right-sided pigtail catheter in place, connected under waterseal Heart: Regular rate and rhythm, no murmurs or gallops Abdomen: Soft, nontender, nondistended, bowel sounds present Skin: No rash       Assessment and plan:  Recurrent aspiration pneumonia with left airspace disease and R empyema Right adrenal mass Schizophrenia, controlled  Hep B Normocytic Anemia Severe protein calorie malnutrition Elevated Ca-125, cystic lesion of ovary- will need OP f/u Distant hx of treated TB  Looking back through her scans, she had highly suspicious lung lesions on Oct 19 scan which regressed on Nov 1 scan.  This would be less consistent with cancer.  The pleural fluid characteristics are c/w empyema and clinical history of migrating infiltrates would be more c/w aspiration events.  Patient received lytics via chest tube on 02/08/2021 She has good chest tube output since then Repeat x-ray chest showed improvement Last night she had only  56 cc of chest tube output We will repeat x-ray chest in the morning, she may need to clamp trial tomorrow and then discontinue chest tube Continue Augmentin prolonged course as planned. Antipsychotic medications as needed    Best Practice (right click and "Reselect all SmartList Selections" daily)    Best Practice (right click and "Reselect all SmartList Selections" daily)   Diet/type: Regular consistency (see orders) DVT prophylaxis: prophylactic heparin  GI prophylaxis: N/A Lines: N/A Foley:  N/A Code Status:  full code Last date of multidisciplinary goals of care discussion [per primary team ]     Jacky Kindle MD [facility] See Amion for pager If no response to pager, please call [phone] until 7pm After 7pm, Please call E-link [phone]

## 2021-02-10 NOTE — Progress Notes (Signed)
Patient's vital signs MEWs is yellow for BP 95/63 and pulse 113. Notified MD Laxman. Will continue monitor.

## 2021-02-10 NOTE — Progress Notes (Signed)
Patient's vital signs Mews turned to yellow. Discussed patient's conditions with charge nurse. No acute change. Patient is alert and oriented. Will continue monitor.

## 2021-02-11 ENCOUNTER — Inpatient Hospital Stay (HOSPITAL_COMMUNITY): Payer: Medicare Other

## 2021-02-11 DIAGNOSIS — D75839 Thrombocytosis, unspecified: Secondary | ICD-10-CM

## 2021-02-11 DIAGNOSIS — E43 Unspecified severe protein-calorie malnutrition: Secondary | ICD-10-CM

## 2021-02-11 DIAGNOSIS — J9601 Acute respiratory failure with hypoxia: Secondary | ICD-10-CM | POA: Diagnosis not present

## 2021-02-11 DIAGNOSIS — B181 Chronic viral hepatitis B without delta-agent: Secondary | ICD-10-CM | POA: Diagnosis not present

## 2021-02-11 DIAGNOSIS — J690 Pneumonitis due to inhalation of food and vomit: Principal | ICD-10-CM

## 2021-02-11 DIAGNOSIS — J869 Pyothorax without fistula: Secondary | ICD-10-CM | POA: Diagnosis not present

## 2021-02-11 DIAGNOSIS — N83299 Other ovarian cyst, unspecified side: Secondary | ICD-10-CM | POA: Diagnosis not present

## 2021-02-11 DIAGNOSIS — D649 Anemia, unspecified: Secondary | ICD-10-CM

## 2021-02-11 DIAGNOSIS — F79 Unspecified intellectual disabilities: Secondary | ICD-10-CM

## 2021-02-11 DIAGNOSIS — D72824 Basophilia: Secondary | ICD-10-CM

## 2021-02-11 DIAGNOSIS — E278 Other specified disorders of adrenal gland: Secondary | ICD-10-CM

## 2021-02-11 IMAGING — DX DG CHEST 1V PORT
1 series · 1 of 1 positions shown · non-contrast
Comparison: [DATE]

CLINICAL DATA: Chest tube for empyema treatment

EXAM:
PORTABLE CHEST 1 VIEW

[chest ap]
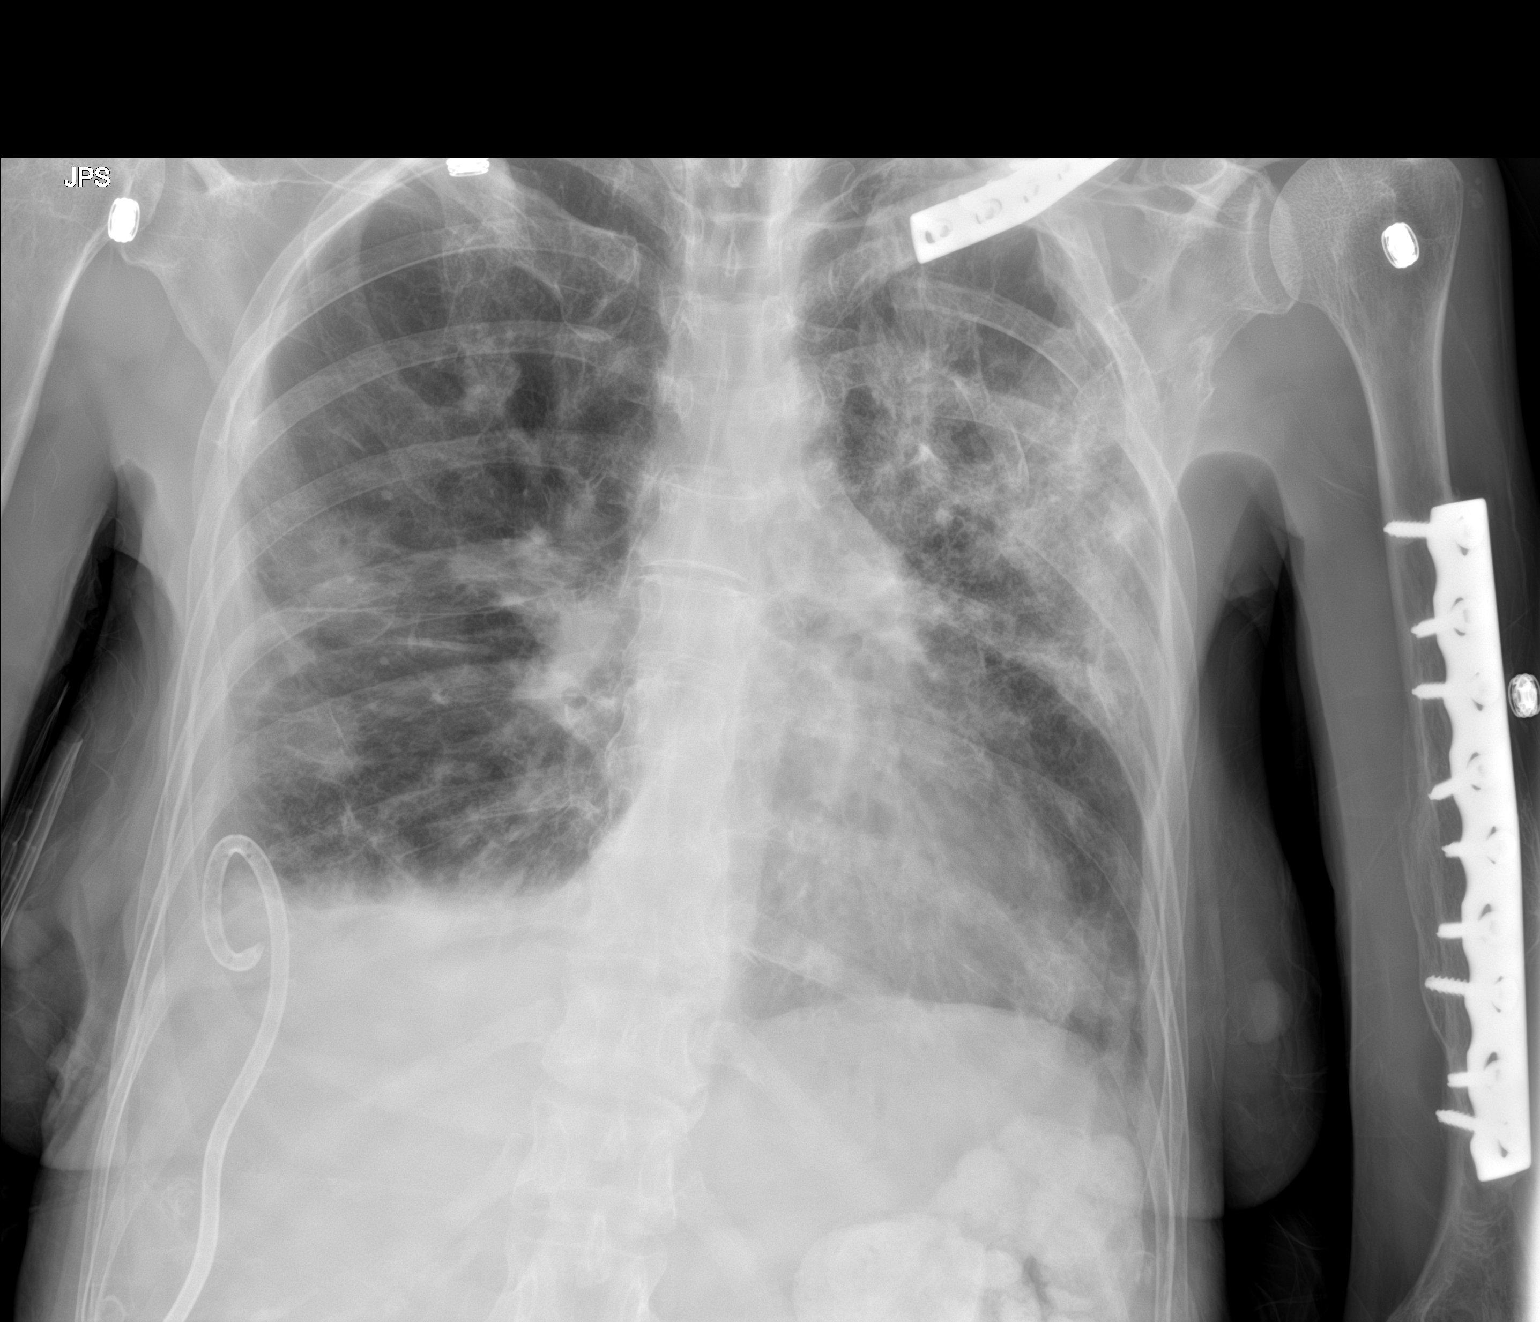

[1 of 1 positions shown; findings below may reference images not displayed]

FINDINGS: Heart and mediastinal shadows are unchanged. Widespread patchy
pulmonary opacities persist, most pronounced in the left midlung.
Right pleural catheter remains in place. There is only a small
amount of regional right pleural density, unchanged.
IMPRESSION: No significant change. Widespread patchy pulmonary densities. Right
pleural catheter in place with a small amount of pleural density,
unchanged.

## 2021-02-11 MED ORDER — SALINE SPRAY 0.65 % NA SOLN
1.0000 | Freq: Two times a day (BID) | NASAL | Status: DC
  Administered 2021-02-11 – 2021-02-18 (×14): 1 via NASAL
  Filled 2021-02-11 (×2): qty 44

## 2021-02-11 MED ORDER — FLUTICASONE PROPIONATE 50 MCG/ACT NA SUSP
1.0000 | Freq: Every day | NASAL | Status: DC
Start: 2021-02-11 — End: 2021-02-18
  Administered 2021-02-11 – 2021-02-18 (×8): 1 via NASAL
  Filled 2021-02-11 (×2): qty 16

## 2021-02-11 NOTE — Progress Notes (Signed)
NAME:  Margaret [name], MRN:  [id number], DOB:  1970-10-05, LOS: 12 ADMISSION DATE:  01/29/2021, CONSULTATION DATE:  10/30 REFERRING MD:  Roderic Palau, REASON FOR CONSULT: pleural effusion  History of Present Illness:  50 yo female presented to [facility] on 10/29 with difficulty walking and cough.  Found to have large partially loculated Rt pleural effusion.  Started on ABx.  Transferred to [facility] for chest tube placement and management.  Pertinent  Medical History  Ovarian mass,  hep B carrier, intelectual disability, TB (childhood), currently lives in group home  [place] [facility] Events: Including procedures, antibiotic start and stop dates in addition to other pertinent events   10/29 Admit to [facility]. CT chest shows R pleural effusion. 10/30-31 TXR to [facility]. 11/1 -placement of right fifth intercostal space 20 French chest tube 11/1-11/2 - received two doses of intrapleural fibrinolytic 11/4 with chest tube removed, CT chest showed residual posterior effusion 11/5 posterior pigtail catheter inserted and fibrinolytics restarted for enlarging right posterior effusion.  11/6  now on room air. Waiting for transfer.   Interim History / Subjective:  Has cough with green phlegm.  Feels like this is coming from her sinuses.  Has discomfort in Lt chest.  About 120 ml from Rt chest tube in past 24 hours.  Objective   Blood pressure 101/64, pulse (!) 103, temperature 98.1 F (36.7 C), temperature source Oral, resp. rate 20, height 5' 4"  (1.626 m), weight 48 kg, SpO2 99 %.        Intake/Output Summary (Last 24 hours) at 02/11/2021 1023 Last data filed at 02/11/2021 0602 Gross per 24 hour  Intake 717 ml  Output 38 ml  Net 679 ml   Filed Weights   02/06/21 0500 02/07/21 0500 02/08/21 0600  Weight: 51.9 kg 48.8 kg 48 kg    Examination:  General - alert Eyes - pupils reactive ENT - no sinus tenderness, no stridor Cardiac - regular rate/rhythm, no murmur Chest - scattered rhonchi, Rt chest tube in  place Abdomen - soft, non tender, + bowel sounds Extremities - no cyanosis, clubbing, or edema Skin - no rashes Neuro - normal strength, moves extremities, follows commands  Assessment/Plan:   Rt empyema in setting of recurrent aspiration pneumonitis. - f/u CXR - continue chest tube to water seal; if drainage remains minimal, then might be able to d/c chest tube on 11/11 - day 14/21 of ABx, currently on augmentin  Rt adrenal mass. - will need follow up imaging as outpt  Rhinosinusitis. - continue ABx - add nasal irrigation, flonase   Schizophrenia. Hepatitis B. Cystic ovarian lesion with elevated Ca-125. - per primary team  Labs:   CMP Latest Ref Rng & Units 02/09/2021 02/09/2021 02/05/2021  Glucose 70 - 99 mg/dL 164(H) 178(H) 125(H)  BUN 6 - 20 mg/dL 7 6 6   Creatinine 0.44 - 1.00 mg/dL 0.53 0.41(L) 0.52  Sodium 135 - 145 mmol/L 136 135 136  Potassium 3.5 - 5.1 mmol/L 3.9 4.1 3.7  Chloride 98 - 111 mmol/L 97(L) 98 104  CO2 22 - 32 mmol/L 31 27 28   Calcium 8.9 - 10.3 mg/dL 8.8(L) 8.4(L) 8.1(L)  Total Protein 6.5 - 8.1 g/dL - - -  Total Bilirubin 0.3 - 1.2 mg/dL - - -  Alkaline Phos 38 - 126 U/L - - -  AST 15 - 41 U/L - - -  ALT 0 - 44 U/L - - -    CBC Latest Ref Rng & Units 02/09/2021 02/09/2021 02/08/2021  WBC 4.0 -  10.5 K/uL 14.8(H) 17.1(H) 22.0(H)  Hemoglobin 12.0 - 15.0 g/dL 8.1(L) 7.8(L) 8.1(L)  Hematocrit 36.0 - 46.0 % 27.1(L) 25.9(L) 26.3(L)  Platelets 150 - 400 K/uL 507(H) 465(H) 418(H)    Signature:  Chesley Mires, MD [place] Pager - [phone] 02/11/2021, 10:47 AM

## 2021-02-11 NOTE — Progress Notes (Signed)
Mobility Specialist Progress Note:   02/11/21 1415  Mobility  Activity Ambulated in room  Level of Assistance Contact guard assist, steadying assist  Assistive Device Other (Comment) (HHA)  Distance Ambulated (ft) 90 ft  Mobility Ambulated with assistance in room  Mobility Response Tolerated well  Mobility performed by Mobility specialist  $Mobility charge 1 Mobility   Pt asx during ambulation. Required HHA for comfort.   Nelta Numbers Mobility Specialist  Phone [phone]

## 2021-02-11 NOTE — Progress Notes (Signed)
PROGRESS NOTE  Margaret [name] YKD:[id number] DOB: 12/03/1970   PCP: Dettinger, Fransisca Kaufmann, MD  Patient is from: Group home  DOA: 01/29/2021 LOS: 30  Chief complaints:  Chief Complaint  Patient presents with   Abdominal Pain     Brief Narrative / Interim history: 50 year old F with PMH of intellectual disability, childhood TB, schizophrenia, hepatitis B, ovarian cancer with elevated CA 125 presenting with progressive cough.  Initially admitted at [facility] for community-acquired pneumonia.  CT showed large loculated right pleural effusion.  She was transferred to [facility]. She underwent right chest tube placement on 11/12 by PCCM.  She had fibrinolytics on 11/1 and 11/2.  Chest tube removed on 11/4.  However, she had enlarging residual posterior effusion for which she had pigtail catheter inserted and fibrinolytic restarted on 11/5.  She is on room air as of 11/6.  Remains on p.o. Augmentin for possible aspiration pneumonia with right empyema.  PCCM following.  She also have a right adrenal mass and cystic ovarian lesion with elevated CEA 125 for which outpatient follow-up are recommended.   Subjective: Seen and examined earlier this morning.  No major events overnight of this morning.  She has no complaints.  She denies chest pain, dyspnea, GI or UTI symptoms.  She is oriented to self, place and month.   Objective: Vitals:   02/11/21 0011 02/11/21 0524 02/11/21 0800 02/11/21 0853  BP: (!) _0 101/64  Pulse: (!) 103 99 (!) 103 (!) 103  Resp: 17 18 (!) 24 20  Temp: 98.2 F (36.8 C) 97.9 F (36.6 C) 98.4 F (36.9 C) 98.1 F (36.7 C)  TempSrc: Oral  Oral Oral  SpO2: 97% 97% 98% 99%  Weight:      Height:        Intake/Output Summary (Last 24 hours) at 02/11/2021 1319 Last data filed at 02/11/2021 0602 Gross per 24 hour  Intake 480 ml  Output 38 ml  Net 442 ml   Filed Weights   02/06/21 0500 02/07/21 0500 02/08/21 0600  Weight: 51.9 kg 48.8 kg 48 kg     Examination:  GENERAL: No apparent distress.  Nontoxic. HEENT: MMM.  Vision and hearing grossly intact.  NECK: Supple.  No apparent JVD.  RESP: 99% on RA.  No IWOB.  Fair aeration bilaterally.  Pigtail to the right chest. CVS:  RRR. Heart sounds normal.  ABD/GI/GU: BS+. Abd soft, NTND.  MSK/EXT:  Moves extremities. No apparent deformity. No edema.  SKIN: no apparent skin lesion or wound NEURO: Awake, alert and oriented appropriately.  No apparent focal neuro deficit. PSYCH: Calm. Normal affect.  She has repetitive right arm movement  Procedures:  11/1-placement of right chest tube 11/5-placement of posterior pigtail catheter  Microbiology summarized: 10/29-COVID-19 and influenza PCR nonreactive. 10/29-blood cultures negative. 10/31-full RVP and MRSA PCR screen negative. 11/1-blood cultures negative.  Assessment & Plan: Recurrent aspiration pneumonia with right empyema -S/p chest tube and pigtail as above.  Follow-up cultures NGTD. -CTX and Zithromax>>> p.o. Augmentin for total of 21 days (through 02/19/2020) per PCCM.  -Leukocytosis improving. -Evaluated by SLP and deemed to have mild aspiration risk.  SLP recommended regular clear liquid diet. -PCCM following.    Right adrenal mass Cystic right ovarian lesion with elevated CEA 125 -Outpatient follow-up.  Schizophrenia: Stable. -Continue chlorpromazine, benztropine and Prozac.   Hypokalemia: Resolved.  Normocytic anemia: H&H relatively stable. Recent Labs    02/02/21 0420 02/02/21 3976 02/03/21 0133 02/04/21 0253 02/05/21 7341 02/06/21 9379 02/07/21 0127  02/08/21 0012 02/09/21 0152 02/09/21 1109  HGB 8.8* 9.5* 8.3* 7.6* 7.6* 7.3* 7.2* 8.1* 7.8* 8.1*  -Continue monitoring  History of hepatitis B infection -Continue home Entecavir.  Intellectual disability: From group home.  Seems to be fairly oriented  Leukocytosis/bandemia/thrombocytosis: Likely due to #1.  Improved. -Continue monitoring  Severe  malnutrition Body mass index is 18.16 kg/m. Nutrition Problem: Severe Malnutrition Etiology: chronic illness (met Ovarian cancer) Signs/Symptoms: severe muscle depletion, severe fat depletion, percent weight loss Percent weight loss: 15.3 % Interventions: Ensure Enlive (each supplement provides 350kcal and 20 grams of protein), MVI   DVT prophylaxis:  heparin injection 5,000 Units Start: 01/30/21 1400 SCDs Start: 01/30/21 1003  Code Status: Full code Family Communication: Patient and/or RN. Available if any question.  Level of care: Med-Surg Status is: Inpatient  Remains inpatient appropriate because: Chest tube/pigtail management   Final disposition: Likely back to group home once cleared.    Consultants:  Pulmonology   Sch Meds:  Scheduled Meds:  amoxicillin-clavulanate  1 tablet Oral Q12H   benztropine  1 mg Oral BID   Chlorhexidine Gluconate Cloth  6 each Topical Daily   chlorproMAZINE  50 mg Oral BID   cloZAPine  250 mg Oral QHS   entecavir  0.5 mg Oral Daily   feeding supplement  237 mL Oral TID BM   FLUoxetine  20 mg Oral Daily   fluticasone  1 spray Each Nare Daily   heparin  5,000 Units Subcutaneous Q8H   mirtazapine  7.5 mg Oral QHS   multivitamin with minerals  1 tablet Oral Daily   polyethylene glycol  17 g Oral Daily   senna-docusate  1 tablet Oral Daily   sodium chloride  1 spray Each Nare BID   sodium chloride flush  10 mL Other Q8H   Continuous Infusions: PRN Meds:.acetaminophen, albuterol, fentaNYL (SUBLIMAZE) injection, hydrOXYzine, morphine injection  Antimicrobials: Anti-infectives (From admission, onward)    Start     Dose/Rate Route Frequency Ordered Stop   02/05/21 1000  amoxicillin-clavulanate (AUGMENTIN) 875-125 MG per tablet 1 tablet        1 tablet Oral Every 12 hours 02/05/21 0818 02/26/21 0959   02/03/21 1700  entecavir (BARACLUDE) tablet 0.5 mg        0.5 mg Oral Daily 02/03/21 1321     02/02/21 1030  cefTRIAXone (ROCEPHIN) 2 g  in sodium chloride 0.9 % 100 mL IVPB  Status:  Discontinued        2 g 200 mL/hr over 30 Minutes Intravenous Every 24 hours 02/02/21 0941 02/05/21 0818   02/01/21 1100  Ampicillin-Sulbactam (UNASYN) 3 g in sodium chloride 0.9 % 100 mL IVPB  Status:  Discontinued        3 g 200 mL/hr over 30 Minutes Intravenous Every 6 hours 02/01/21 1007 02/02/21 0941   01/31/21 0500  cefTRIAXone (ROCEPHIN) 2 g in sodium chloride 0.9 % 100 mL IVPB  Status:  Discontinued        2 g 200 mL/hr over 30 Minutes Intravenous Every 24 hours 01/30/21 1004 02/01/21 1007   01/30/21 1015  entecavir (BARACLUDE) tablet 0.5 mg  Status:  Discontinued       Note to Pharmacy: TAKE (1) TABLET BY MOUTH ONCE DAILY.     0.5 mg Oral Daily 01/30/21 1004 02/02/21 0925   01/30/21 0515  cefTRIAXone (ROCEPHIN) 2 g in sodium chloride 0.9 % 100 mL IVPB        2 g 200 mL/hr over 30 Minutes Intravenous  Once 01/30/21 0509 01/30/21 0615   01/30/21 0515  azithromycin (ZITHROMAX) 500 mg in sodium chloride 0.9 % 250 mL IVPB  Status:  Discontinued        500 mg 250 mL/hr over 60 Minutes Intravenous Every 24 hours 01/30/21 0509 02/02/21 1357   01/30/21 0315  doxycycline (VIBRA-TABS) tablet 100 mg        100 mg Oral  Once 01/30/21 0314 01/30/21 0328        I have personally reviewed the following labs and images: CBC: Recent Labs  Lab 02/05/21 0552 02/06/21 0653 02/07/21 0127 02/08/21 0012 02/09/21 0152 02/09/21 1109  WBC 25.1* 23.6* 19.1* 22.0* 17.1* 14.8*  NEUTROABS 22.8*  --   --   --   --   --   HGB 7.6* 7.3* 7.2* 8.1* 7.8* 8.1*  HCT 24.7* 23.1* 24.0* 26.3* 25.9* 27.1*  MCV 86.4 86.5 87.6 87.1 86.9 87.1  PLT 322 330 341 418* 465* 507*   BMP &GFR Recent Labs  Lab 02/05/21 0552 02/09/21 0152 02/09/21 1109  NA 136 135 136  K 3.7 4.1 3.9  CL 104 98 97*  CO2 _0 GLUCOSE 125* 178* 164*  BUN _1 CREATININE 0.52 0.41* 0.53  CALCIUM 8.1* 8.4* 8.8*  MG  --  1.8 2.0   Estimated Creatinine Clearance: 65.2 mL/min  (by C-G formula based on SCr of 0.53 mg/dL). Liver & Pancreas: No results for input(s): AST, ALT, ALKPHOS, BILITOT, PROT, ALBUMIN in the last 168 hours. No results for input(s): LIPASE, AMYLASE in the last 168 hours. No results for input(s): AMMONIA in the last 168 hours. Diabetic: No results for input(s): HGBA1C in the last 72 hours. No results for input(s): GLUCAP in the last 168 hours. Cardiac Enzymes: No results for input(s): CKTOTAL, CKMB, CKMBINDEX, TROPONINI in the last 168 hours. No results for input(s): PROBNP in the last 8760 hours. Coagulation Profile: No results for input(s): INR, PROTIME in the last 168 hours. Thyroid Function Tests: No results for input(s): TSH, T4TOTAL, FREET4, T3FREE, THYROIDAB in the last 72 hours. Lipid Profile: No results for input(s): CHOL, HDL, LDLCALC, TRIG, CHOLHDL, LDLDIRECT in the last 72 hours. Anemia Panel: No results for input(s): VITAMINB12, FOLATE, FERRITIN, TIBC, IRON, RETICCTPCT in the last 72 hours. Urine analysis:    Component Value Date/Time   COLORURINE YELLOW 01/30/2021 0504   APPEARANCEUR CLEAR 01/30/2021 0504   APPEARANCEUR Clear 06/30/2016 1013   LABSPEC 1.043 (H) 01/30/2021 0504   PHURINE 6.0 01/30/2021 0504   GLUCOSEU NEGATIVE 01/30/2021 0504   HGBUR NEGATIVE 01/30/2021 0504   BILIRUBINUR NEGATIVE 01/30/2021 0504   BILIRUBINUR Negative 06/30/2016 [place] 01/30/2021 0504   PROTEINUR NEGATIVE 01/30/2021 0504   UROBILINOGEN 0.2 04/13/2011 0330   NITRITE POSITIVE (A) 01/30/2021 0504   LEUKOCYTESUR NEGATIVE 01/30/2021 0504   Sepsis Labs: Invalid input(s): PROCALCITONIN, [place]  Microbiology: Recent Results (from the past 240 hour(s))  Respiratory (~20 pathogens) panel by PCR     Status: None   Collection Time: 02/01/21  9:50 PM   Specimen: Nasopharyngeal Swab; Respiratory  Result Value Ref Range Status   Adenovirus NOT DETECTED NOT DETECTED Final   Coronavirus 229E NOT DETECTED NOT DETECTED  Final    Comment: (NOTE) The Coronavirus on the Respiratory Panel, DOES NOT test for the novel  Coronavirus (2019 nCoV)    Coronavirus HKU1 NOT DETECTED NOT DETECTED Final   Coronavirus NL63 NOT DETECTED NOT DETECTED Final   Coronavirus OC43 NOT DETECTED  NOT DETECTED Final   Metapneumovirus NOT DETECTED NOT DETECTED Final   Rhinovirus / Enterovirus NOT DETECTED NOT DETECTED Final   Influenza A NOT DETECTED NOT DETECTED Final   Influenza B NOT DETECTED NOT DETECTED Final   Parainfluenza Virus 1 NOT DETECTED NOT DETECTED Final   Parainfluenza Virus 2 NOT DETECTED NOT DETECTED Final   Parainfluenza Virus 3 NOT DETECTED NOT DETECTED Final   Parainfluenza Virus 4 NOT DETECTED NOT DETECTED Final   Respiratory Syncytial Virus NOT DETECTED NOT DETECTED Final   Bordetella pertussis NOT DETECTED NOT DETECTED Final   Bordetella Parapertussis NOT DETECTED NOT DETECTED Final   Chlamydophila pneumoniae NOT DETECTED NOT DETECTED Final   Mycoplasma pneumoniae NOT DETECTED NOT DETECTED Final    Comment: Performed at [facility], [place] [address], [place], [place] [address]  MRSA Next Gen by PCR, Nasal     Status: None   Collection Time: 02/01/21 10:55 PM   Specimen: Nasal Mucosa; Nasal Swab  Result Value Ref Range Status   MRSA by PCR Next Gen NOT DETECTED NOT DETECTED Final    Comment: (NOTE) The GeneXpert MRSA Assay (FDA approved for NASAL specimens only), is one component of a comprehensive MRSA colonization surveillance program. It is not intended to diagnose MRSA infection nor to guide or monitor treatment for MRSA infections. Test performance is not FDA approved in patients less than 69 years old. Performed at [facility], [place] [address], [place], [place] [address]   Body fluid culture w Gram Stain     Status: None   Collection Time: 02/02/21  8:06 AM   Specimen: Pleural Fluid  Result Value Ref Range Status   Specimen Description PLEURAL FLUID  Final   Special Requests  PLEURAL RIGHT  Final   Gram Stain   Final    FEW WBC PRESENT, PREDOMINANTLY PMN NO ORGANISMS SEEN    Culture   Final    NO GROWTH Performed at [facility], [place] [address] [place],  [address]    Report Status 02/05/2021 FINAL  Final    Radiology Studies: DG CHEST PORT 1 VIEW  Result Date: 02/11/2021 CLINICAL DATA:  Chest tube for empyema treatment EXAM: PORTABLE CHEST 1 VIEW COMPARISON:  02/09/2021 FINDINGS: Heart and mediastinal shadows are unchanged. Widespread patchy pulmonary opacities persist, most pronounced in the left midlung. Right pleural catheter remains in place. There is only a small amount of regional right pleural density, unchanged. IMPRESSION: No significant change. Widespread patchy pulmonary densities. Right pleural catheter in place with a small amount of pleural density, unchanged. Electronically Signed   By: Nelson Chimes M.D.   On: 02/11/2021 08:19      Shown [place] [address] [place]  If 7PM-7AM, please contact night-coverage [email or web address] 02/11/2021, 1:19 PM

## 2021-02-12 ENCOUNTER — Inpatient Hospital Stay (HOSPITAL_COMMUNITY): Payer: Medicare Other

## 2021-02-12 DIAGNOSIS — N83299 Other ovarian cyst, unspecified side: Secondary | ICD-10-CM | POA: Diagnosis not present

## 2021-02-12 DIAGNOSIS — B181 Chronic viral hepatitis B without delta-agent: Secondary | ICD-10-CM | POA: Diagnosis not present

## 2021-02-12 DIAGNOSIS — J90 Pleural effusion, not elsewhere classified: Secondary | ICD-10-CM | POA: Diagnosis not present

## 2021-02-12 DIAGNOSIS — J690 Pneumonitis due to inhalation of food and vomit: Secondary | ICD-10-CM | POA: Diagnosis not present

## 2021-02-12 DIAGNOSIS — J9601 Acute respiratory failure with hypoxia: Secondary | ICD-10-CM | POA: Diagnosis not present

## 2021-02-12 DIAGNOSIS — R5381 Other malaise: Secondary | ICD-10-CM

## 2021-02-12 LAB — IRON AND TIBC
Iron: 20 ug/dL — ABNORMAL LOW (ref 28–170)
Saturation Ratios: 9 % — ABNORMAL LOW (ref 10.4–31.8)
TIBC: 230 ug/dL — ABNORMAL LOW (ref 250–450)
UIBC: 210 ug/dL

## 2021-02-12 LAB — RETICULOCYTES
Immature Retic Fract: 35.5 % — ABNORMAL HIGH (ref 2.3–15.9)
RBC.: 2.87 MIL/uL — ABNORMAL LOW (ref 3.87–5.11)
Retic Count, Absolute: 122.8 10*3/uL (ref 19.0–186.0)
Retic Ct Pct: 4.3 % — ABNORMAL HIGH (ref 0.4–3.1)

## 2021-02-12 LAB — CBC
HCT: 25.2 % — ABNORMAL LOW (ref 36.0–46.0)
Hemoglobin: 7.6 g/dL — ABNORMAL LOW (ref 12.0–15.0)
MCH: 26.6 pg (ref 26.0–34.0)
MCHC: 30.2 g/dL (ref 30.0–36.0)
MCV: 88.1 fL (ref 80.0–100.0)
Platelets: 517 10*3/uL — ABNORMAL HIGH (ref 150–400)
RBC: 2.86 MIL/uL — ABNORMAL LOW (ref 3.87–5.11)
RDW: 15.1 % (ref 11.5–15.5)
WBC: 18.3 10*3/uL — ABNORMAL HIGH (ref 4.0–10.5)
nRBC: 0 % (ref 0.0–0.2)

## 2021-02-12 LAB — RENAL FUNCTION PANEL
Albumin: 1.9 g/dL — ABNORMAL LOW (ref 3.5–5.0)
Anion gap: 7 (ref 5–15)
BUN: 10 mg/dL (ref 6–20)
CO2: 29 mmol/L (ref 22–32)
Calcium: 8.4 mg/dL — ABNORMAL LOW (ref 8.9–10.3)
Chloride: 98 mmol/L (ref 98–111)
Creatinine, Ser: 0.48 mg/dL (ref 0.44–1.00)
GFR, Estimated: >60 mL/min (ref >60)
Glucose, Bld: 133 mg/dL — ABNORMAL HIGH (ref 70–99)
Phosphorus: 3.6 mg/dL (ref 2.5–4.6)
Potassium: 3.8 mmol/L (ref 3.5–5.1)
Sodium: 134 mmol/L — ABNORMAL LOW (ref 135–145)

## 2021-02-12 LAB — MAGNESIUM: Magnesium: 2.1 mg/dL (ref 1.7–2.4)

## 2021-02-12 LAB — VITAMIN B12: Vitamin B-12: 465 pg/mL (ref 180–914)

## 2021-02-12 LAB — FERRITIN: Ferritin: 292 ng/mL (ref 11–307)

## 2021-02-12 LAB — FOLATE: Folate: 12.5 ng/mL (ref >5.9)

## 2021-02-12 IMAGING — DX DG CHEST 1V PORT
1 series · 1 of 1 positions shown · non-contrast
Comparison: Prior chest x-ray [DATE]

CLINICAL DATA: The right pleural effusion with chest tube in place

EXAM:
PORTABLE CHEST 1 VIEW

[chest ap]
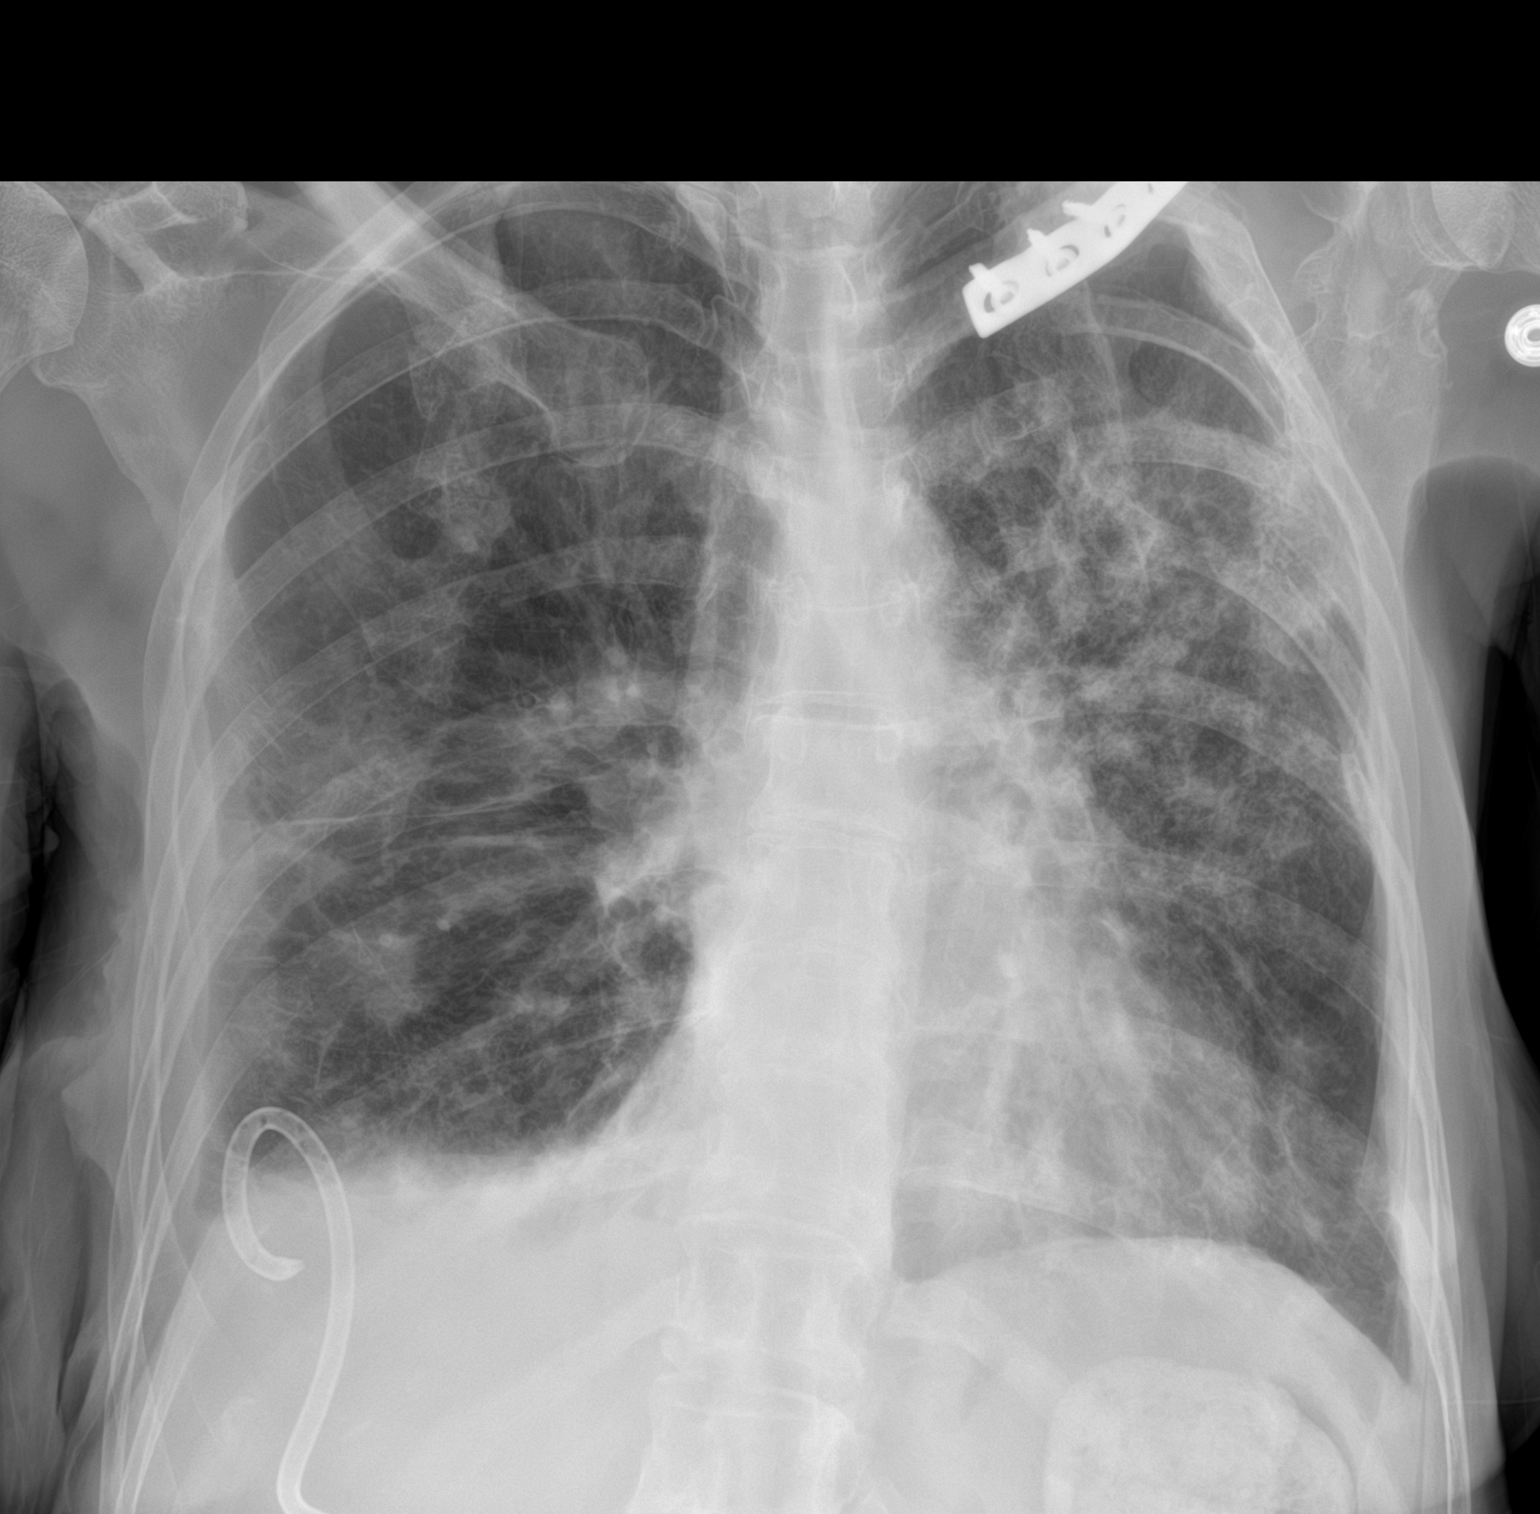

[1 of 1 positions shown; findings below may reference images not displayed]

FINDINGS: Pigtail thoracostomy tube remains in good position overlying the
right lung base. Slight interval improvement in the appearance of
the chest with decreasing patchy airspace opacities throughout the
left mid lung and scattered throughout the right lung. Stable
pleural thickening along the margin of the right lower lobe. Cardiac
and mediastinal contours are unchanged. No acute osseous
abnormality. Postsurgical changes of the clavicle again noted.
IMPRESSION: 1. Slight interval improvement in aeration with decreasing patchy
airspace opacities bilaterally.
2. Stable position of pigtail thoracostomy tube.
3. No new or acute abnormality identified.

## 2021-02-12 IMAGING — DX DG CHEST 1V PORT
1 series · 1 of 1 positions shown · non-contrast
Comparison: Previous studies including the examination of
[DATE]

CLINICAL DATA: Pleural effusion, difficulty breathing

EXAM:
PORTABLE CHEST 1 VIEW

[chest ap]
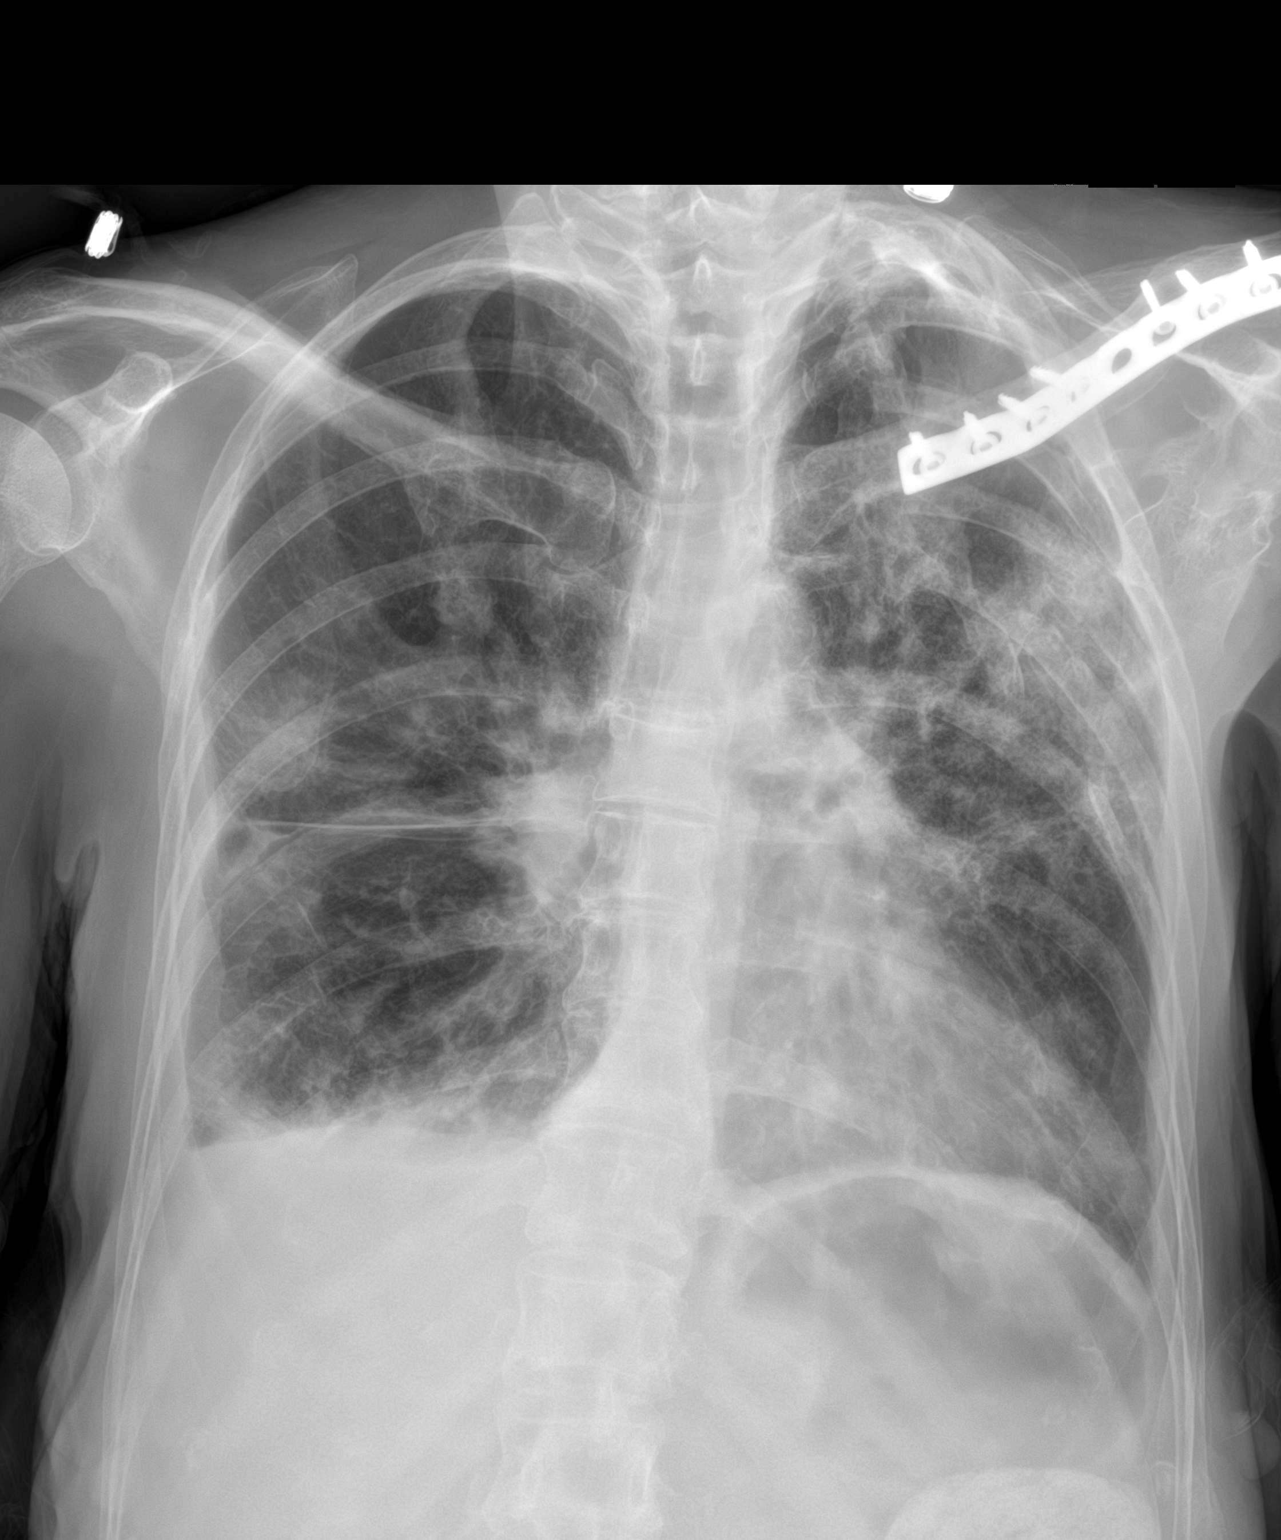

[1 of 1 positions shown; findings below may reference images not displayed]

FINDINGS: Heart is within normal limits. Low position of diaphragms suggests
COPD. There is interval removal of right chest tube. Small right
pleural effusion is seen. There is blunting of left lateral CP
angle. There is no pneumothorax. There are patchy infiltrates in the
parahilar regions, more so on the left side. Increased markings are
seen in both lower lung fields with no significant interval change.
IMPRESSION: There is interval removal of right chest tube. Small bilateral
pleural effusions. There is no pneumothorax.

COPD. There are patchy infiltrates in both lungs suggesting
pneumonia. Part of this finding may suggest underlying scarring.

## 2021-02-12 NOTE — Progress Notes (Signed)
Mobility Specialist Progress Note:   02/12/21 1120  Mobility  Activity Contraindicated/medical hold;Refused mobility   Pt refused mobility today and displayed agitation. RN checked vitals, mobility deferred.  Nelta Numbers Mobility Specialist  Phone [phone]

## 2021-02-12 NOTE — Progress Notes (Signed)
PROGRESS NOTE  Margaret [name] AYT:[id number] DOB: [birth year]   PCP: Dettinger, Fransisca Kaufmann, MD  Patient is from: Group home  DOA: 01/29/2021 LOS: 75  Chief complaints:  Chief Complaint  Patient presents with   Abdominal Pain     Brief Narrative / Interim history: 50 year old F with PMH of intellectual disability, childhood TB, schizophrenia, hepatitis B, ovarian cancer with elevated CA 125 presenting with progressive cough.  Initially admitted at [facility] for community-acquired pneumonia.  CT showed large loculated right pleural effusion.  She was transferred to [facility]. She underwent right chest tube placement on 11/12 by PCCM.  She had fibrinolytics on 11/1 and 11/2.  Chest tube removed on 11/4.  However, she had enlarging residual posterior effusion for which she required pigtail catheter from 11/5-11/11.  She also had additional fibrinolytics.  Remains on p.o. Augmentin for possible aspiration pneumonia with right empyema. Currently on room air.    She also have a right adrenal mass and cystic ovarian lesion with elevated CEA 125 for which outpatient follow-up are recommended.   Subjective: Seen and examined earlier this morning.  No major events overnight of this morning.  No complaints but not a great historian.  She denies chest pain, dyspnea, GI or UTI symptoms.  Objective: Vitals:   02/12/21 0401 02/12/21 0847 02/12/21 0848 02/12/21 1128  BP: (!) 102/59 (!) 86/58 (!) 88/55 94/64  Pulse: 100 (!) 101 (!) 101 (!) 105  Resp: _0 Temp: 98.4 F (36.9 C) 98.2 F (36.8 C)    TempSrc: Oral Oral    SpO2: 99% 96% 97% 98%  Weight:      Height:        Intake/Output Summary (Last 24 hours) at 02/12/2021 1223 Last data filed at 02/12/2021 1100 Gross per 24 hour  Intake 490 ml  Output 300 ml  Net 190 ml   Filed Weights   02/06/21 0500 02/07/21 0500 02/08/21 0600  Weight: 51.9 kg 48.8 kg 48 kg    Examination:  GENERAL: No apparent distress.   Nontoxic. HEENT: MMM.  Vision and hearing grossly intact.  NECK: Supple.  No apparent JVD.  RESP: 98% on RA.  No IWOB.  Fair aeration bilaterally. CVS:  RRR. Heart sounds normal.  ABD/GI/GU: BS+. Abd soft, NTND.  MSK/EXT:  Moves extremities. No apparent deformity. No edema.  SKIN: no apparent skin lesion or wound NEURO: Awake and alert. Oriented fairly.  Seems to have limited insight.  No apparent focal neuro deficit. PSYCH: Calm.  No distress or agitation.  Procedures:  11/1-placement of right chest tube 11/5-11/11- posterior pigtail catheter  Microbiology summarized: 10/29-COVID-19 and influenza PCR nonreactive. 10/29-blood cultures negative. 10/31-full RVP and MRSA PCR screen negative. 11/1-blood cultures negative.  Assessment & Plan: Recurrent aspiration pneumonia with right empyema -S/p chest tube and pigtail as above.  Follow-up cultures NGTD. -Repeat CXR on 11/11 with patchy infiltrates bilaterally, small bilateral pleural effusions and no PTX. -Leukocytosis worse today. -CTX and Zithromax>>> p.o. Augmentin through 02/26/2021 per PCCM -Evaluated by SLP and deemed to have mild aspiration risk.  SLP recommended regular clear liquid diet.    Right adrenal mass Cystic right ovarian lesion with elevated CEA 125 -Outpatient follow-up.  Schizophrenia: Somewhat stable.  -Continue chlorpromazine, benztropine and Prozac.   Hypokalemia: Resolved.  Normocytic anemia: H&H relatively stable. Recent Labs    02/02/21 0627 02/03/21 0133 02/04/21 0253 02/05/21 0552 02/06/21 0653 02/07/21 0127 02/08/21 0012 02/09/21 0152 02/09/21 1109 02/12/21 0102  HGB 9.5* 8.3* 7.6*  7.6* 7.3* 7.2* 8.1* 7.8* 8.1* 7.6*  -Continue monitoring  History of hepatitis B infection -Continue home Entecavir.  Intellectual disability: From group home.  Seems to be fairly oriented but does not seem to have great insight.  Not fully cooperative.  Leukocytosis/bandemia/thrombocytosis: Likely due to  #1.  -Continue monitoring  Physical deconditioning -PT/OT if she cooperates.  Severe malnutrition: POA Body mass index is 18.16 kg/m. Nutrition Problem: Severe Malnutrition Etiology: chronic illness (met Ovarian cancer) Signs/Symptoms: severe muscle depletion, severe fat depletion, percent weight loss Percent weight loss: 15.3 % Interventions: Ensure Enlive (each supplement provides 350kcal and 20 grams of protein), MVI   DVT prophylaxis:  heparin injection 5,000 Units Start: 01/30/21 1400 SCDs Start: 01/30/21 1003  Code Status: Full code Family Communication: Patient and/or RN. Available if any question.  Level of care: Med-Surg Status is: Inpatient  Remains inpatient appropriate because: Safe disposition   Final disposition: Back to group home when they can take her back.  Medically stable for discharge    Consultants:  Pulmonology   Sch Meds:  Scheduled Meds:  amoxicillin-clavulanate  1 tablet Oral Q12H   benztropine  1 mg Oral BID   Chlorhexidine Gluconate Cloth  6 each Topical Daily   chlorproMAZINE  50 mg Oral BID   cloZAPine  250 mg Oral QHS   entecavir  0.5 mg Oral Daily   feeding supplement  237 mL Oral TID BM   FLUoxetine  20 mg Oral Daily   fluticasone  1 spray Each Nare Daily   heparin  5,000 Units Subcutaneous Q8H   mirtazapine  7.5 mg Oral QHS   multivitamin with minerals  1 tablet Oral Daily   polyethylene glycol  17 g Oral Daily   senna-docusate  1 tablet Oral Daily   sodium chloride  1 spray Each Nare BID   sodium chloride flush  10 mL Other Q8H   Continuous Infusions: PRN Meds:.acetaminophen, albuterol, fentaNYL (SUBLIMAZE) injection, hydrOXYzine, morphine injection  Antimicrobials: Anti-infectives (From admission, onward)    Start     Dose/Rate Route Frequency Ordered Stop   02/05/21 1000  amoxicillin-clavulanate (AUGMENTIN) 875-125 MG per tablet 1 tablet        1 tablet Oral Every 12 hours 02/05/21 0818 02/26/21 0959   02/03/21 1700   entecavir (BARACLUDE) tablet 0.5 mg        0.5 mg Oral Daily 02/03/21 1321     02/02/21 1030  cefTRIAXone (ROCEPHIN) 2 g in sodium chloride 0.9 % 100 mL IVPB  Status:  Discontinued        2 g 200 mL/hr over 30 Minutes Intravenous Every 24 hours 02/02/21 0941 02/05/21 0818   02/01/21 1100  Ampicillin-Sulbactam (UNASYN) 3 g in sodium chloride 0.9 % 100 mL IVPB  Status:  Discontinued        3 g 200 mL/hr over 30 Minutes Intravenous Every 6 hours 02/01/21 1007 02/02/21 0941   01/31/21 0500  cefTRIAXone (ROCEPHIN) 2 g in sodium chloride 0.9 % 100 mL IVPB  Status:  Discontinued        2 g 200 mL/hr over 30 Minutes Intravenous Every 24 hours 01/30/21 1004 02/01/21 1007   01/30/21 1015  entecavir (BARACLUDE) tablet 0.5 mg  Status:  Discontinued       Note to Pharmacy: TAKE (1) TABLET BY MOUTH ONCE DAILY.     0.5 mg Oral Daily 01/30/21 1004 02/02/21 0925   01/30/21 0515  cefTRIAXone (ROCEPHIN) 2 g in sodium chloride 0.9 % 100 mL IVPB  2 g 200 mL/hr over 30 Minutes Intravenous  Once 01/30/21 0509 01/30/21 0615   01/30/21 0515  azithromycin (ZITHROMAX) 500 mg in sodium chloride 0.9 % 250 mL IVPB  Status:  Discontinued        500 mg 250 mL/hr over 60 Minutes Intravenous Every 24 hours 01/30/21 0509 02/02/21 1357   01/30/21 0315  doxycycline (VIBRA-TABS) tablet 100 mg        100 mg Oral  Once 01/30/21 0314 01/30/21 0328        I have personally reviewed the following labs and images: CBC: Recent Labs  Lab 02/07/21 0127 02/08/21 0012 02/09/21 0152 02/09/21 1109 02/12/21 0102  WBC 19.1* 22.0* 17.1* 14.8* 18.3*  HGB 7.2* 8.1* 7.8* 8.1* 7.6*  HCT 24.0* 26.3* 25.9* 27.1* 25.2*  MCV 87.6 87.1 86.9 87.1 88.1  PLT 341 418* 465* 507* 517*   BMP &GFR Recent Labs  Lab 02/09/21 0152 02/09/21 1109 02/12/21 0102  NA 135 136 134*  K 4.1 3.9 3.8  CL 98 97* 98  CO2 _0 GLUCOSE 178* 164* 133*  BUN _1 CREATININE 0.41* 0.53 0.48  CALCIUM 8.4* 8.8* 8.4*  MG 1.8 2.0 2.1  PHOS   --   --  3.6   Estimated Creatinine Clearance: 65.2 mL/min (by C-G formula based on SCr of 0.48 mg/dL). Liver & Pancreas: Recent Labs  Lab 02/12/21 0102  ALBUMIN 1.9*   No results for input(s): LIPASE, AMYLASE in the last 168 hours. No results for input(s): AMMONIA in the last 168 hours. Diabetic: No results for input(s): HGBA1C in the last 72 hours. No results for input(s): GLUCAP in the last 168 hours. Cardiac Enzymes: No results for input(s): CKTOTAL, CKMB, CKMBINDEX, TROPONINI in the last 168 hours. No results for input(s): PROBNP in the last 8760 hours. Coagulation Profile: No results for input(s): INR, PROTIME in the last 168 hours. Thyroid Function Tests: No results for input(s): TSH, T4TOTAL, FREET4, T3FREE, THYROIDAB in the last 72 hours. Lipid Profile: No results for input(s): CHOL, HDL, LDLCALC, TRIG, CHOLHDL, LDLDIRECT in the last 72 hours. Anemia Panel: Recent Labs    02/12/21 0102  VITAMINB12 465  FOLATE 12.5  FERRITIN 292  TIBC 230*  IRON 20*  RETICCTPCT 4.3*   Urine analysis:    Component Value Date/Time   COLORURINE YELLOW 01/30/2021 0504   APPEARANCEUR CLEAR 01/30/2021 0504   APPEARANCEUR Clear 06/30/2016 1013   LABSPEC 1.043 (H) 01/30/2021 0504   PHURINE 6.0 01/30/2021 0504   GLUCOSEU NEGATIVE 01/30/2021 0504   HGBUR NEGATIVE 01/30/2021 0504   BILIRUBINUR NEGATIVE 01/30/2021 0504   BILIRUBINUR Negative 06/30/2016 Margaret [name] 01/30/2021 0504   PROTEINUR NEGATIVE 01/30/2021 0504   UROBILINOGEN 0.2 04/13/2011 0330   NITRITE POSITIVE (A) 01/30/2021 0504   LEUKOCYTESUR NEGATIVE 01/30/2021 0504   Sepsis Labs: Invalid input(s): PROCALCITONIN, [place]  Microbiology: No results found for this or any previous visit (from the past 240 hour(s)).   Radiology Studies: DG CHEST PORT 1 VIEW  Result Date: 02/12/2021 CLINICAL DATA:  Pleural effusion, difficulty breathing EXAM: PORTABLE CHEST 1 VIEW COMPARISON:  Previous studies  including the examination of 02/11/2021 FINDINGS: Heart is within normal limits. Low position of diaphragms suggests COPD. There is interval removal of right chest tube. Small right pleural effusion is seen. There is blunting of left lateral CP angle. There is no pneumothorax. There are patchy infiltrates in the parahilar regions, more so on the left side. Increased markings are seen  in both lower lung fields with no significant interval change. IMPRESSION: There is interval removal of right chest tube. Small bilateral pleural effusions. There is no pneumothorax. COPD. There are patchy infiltrates in both lungs suggesting pneumonia. Part of this finding may suggest underlying scarring. Electronically Signed   By: Elmer Picker M.D.   On: 02/12/2021 10:06   DG Chest Port 1 View  Result Date: 02/12/2021 CLINICAL DATA:  The right pleural effusion with chest tube in place EXAM: PORTABLE CHEST 1 VIEW COMPARISON:  Prior chest x-ray 02/11/2021 FINDINGS: Pigtail thoracostomy tube remains in good position overlying the right lung base. Slight interval improvement in the appearance of the chest with decreasing patchy airspace opacities throughout the left mid lung and scattered throughout the right lung. Stable pleural thickening along the margin of the right lower lobe. Cardiac and mediastinal contours are unchanged. No acute osseous abnormality. Postsurgical changes of the clavicle again noted. IMPRESSION: 1. Slight interval improvement in aeration with decreasing patchy airspace opacities bilaterally. 2. Stable position of pigtail thoracostomy tube. 3. No new or acute abnormality identified. Electronically Signed   By: Jacqulynn Cadet M.D.   On: 02/12/2021 06:34      Keyari Kleeman T. [place]  If 7PM-7AM, please contact night-coverage [email or web address] 02/12/2021, 12:23 PM

## 2021-02-12 NOTE — Progress Notes (Signed)
NAME:  Margaret [name], MRN:  [id number], DOB:  05-24-1970, LOS: 32 ADMISSION DATE:  01/29/2021, CONSULTATION DATE:  10/30 REFERRING MD:  Roderic Palau, REASON FOR CONSULT: pleural effusion  History of Present Illness:  50 yo female presented to [facility] on 10/29 with difficulty walking and cough.  Found to have large partially loculated Rt pleural effusion.  Started on ABx.  Transferred to [facility] for chest tube placement and management.  Pertinent  Medical History  Ovarian mass,  hep B carrier, intelectual disability, TB (childhood), currently lives in group home  [facility] Events: Including procedures, antibiotic start and stop dates in addition to other pertinent events   10/29 Admit to [facility] CT chest shows R pleural effusion. 10/30-31 TXR to [facility]. 11/1 -placement of right fifth intercostal space 20 French chest tube 11/1-11/2 - received two doses of intrapleural fibrinolytic 11/4 with chest tube removed, CT chest showed residual posterior effusion 11/5 posterior pigtail catheter inserted and fibrinolytics restarted for enlarging right posterior effusion.  11/6  now on room air 11/11 chest tube discontinued  Interim History / Subjective:  Cough and sinuses better.  Minimal outpt from chest tube.  Objective   Blood pressure (!) 102/59, pulse 100, temperature 98.4 F (36.9 C), temperature source Oral, resp. rate 17, height 5' 4"  (1.626 m), weight 48 kg, SpO2 99 %.        Intake/Output Summary (Last 24 hours) at 02/12/2021 0830 Last data filed at 02/11/2021 2022 Gross per 24 hour  Intake 260 ml  Output 300 ml  Net -40 ml   Filed Weights   02/06/21 0500 02/07/21 0500 02/08/21 0600  Weight: 51.9 kg 48.8 kg 48 kg    Examination:  General - alert Eyes - pupils reactive ENT - no sinus tenderness, no stridor Cardiac - regular rate/rhythm, no murmur Chest - scattered rhonchi, Rt chest tube in place Abdomen - soft, non tender, + bowel sounds Extremities - no cyanosis, clubbing, or  edema Skin - no rashes Neuro - normal strength, moves extremities, follows commands  CXR 02/12/21 - no significant effusion on Rt.  Decreased b/l ASD  Assessment/Plan:   Rt empyema in setting of recurrent aspiration pneumonitis. - discontinue chest tube 02/12/21 - f/u CXR - day 14/21 of ABx, currently on augmentin  Rt adrenal mass. - will need follow up imaging as outpt  Rhinosinusitis. - continue ABx - continue nasal irrigation, flonase   Schizophrenia. Hepatitis B. Cystic ovarian lesion with elevated Ca-125. - per primary team  Labs:   CMP Latest Ref Rng & Units 02/12/2021 02/09/2021 02/09/2021  Glucose 70 - 99 mg/dL 133(H) 164(H) 178(H)  BUN 6 - 20 mg/dL 10 7 6   Creatinine 0.44 - 1.00 mg/dL 0.48 0.53 0.41(L)  Sodium 135 - 145 mmol/L 134(L) 136 135  Potassium 3.5 - 5.1 mmol/L 3.8 3.9 4.1  Chloride 98 - 111 mmol/L 98 97(L) 98  CO2 22 - 32 mmol/L 29 31 27   Calcium 8.9 - 10.3 mg/dL 8.4(L) 8.8(L) 8.4(L)  Total Protein 6.5 - 8.1 g/dL - - -  Total Bilirubin 0.3 - 1.2 mg/dL - - -  Alkaline Phos 38 - 126 U/L - - -  AST 15 - 41 U/L - - -  ALT 0 - 44 U/L - - -    CBC Latest Ref Rng & Units 02/12/2021 02/09/2021 02/09/2021  WBC 4.0 - 10.5 K/uL 18.3(H) 14.8(H) 17.1(H)  Hemoglobin 12.0 - 15.0 g/dL 7.6(L) 8.1(L) 7.8(L)  Hematocrit 36.0 - 46.0 % 25.2(L) 27.1(L) 25.9(L)  Platelets  150 - 400 K/uL 517(H) 507(H) 465(H)    Signature:  Chesley Mires, MD [place] Pager - [phone] 02/12/2021, 8:30 AM

## 2021-02-12 NOTE — Procedures (Signed)
[place] [facility] [facility] Procedure: Right chest tube removal, pigtail catheter. Patient was turned the left side to access the right sided pigtail chest tube.  Dressing was removed tube was clamped off sutures for released and tube was pulled out without problem.  Vaseline gauze dressing was applied.  Sterile dressing.  Chest x-ray will be ordered for 1 hour and in the a.m.  Nurses at the bedside updated on care, no apparent complications at this time.  Richardson Landry Tamel Abel ACNP Acute Care Nurse Practitioner [place] Please consult Amion 02/12/2021, 9:38 AM

## 2021-02-12 NOTE — Evaluation (Addendum)
Physical Therapy Evaluation Patient Details Name: Margaret [name] MRN: [id number] DOB: [birth year] Today's Date: 02/12/2021  History of Present Illness  50 year old F admitted 10/28 initially admitted at [facility] for community-acquired pneumonia.  CT showed large loculated right pleural effusion.  She was transferred to [facility] 10/30. She had fibrinolytics on 11/1 and 11/2.  Chest tube removed on 11/4.  However, she had enlarging residual posterior effusion for which she required pigtail catheter chest tube from 11/5-11/11. PMH of intellectual disability, childhood TB, schizophrenia, hepatitis B, ovarian cancer with elevated CA 125  Clinical Impression  Pt admitted with above diagnosis. Pt was able to ambulate on unit with min guard to min assist without device.  Pt unsteady at times reaching for therapist. Discussed use of RW and pt agreeable if its needed on d/c.  VSS during session. Will follow acutely.  Pt currently with functional limitations due to the deficits listed below (see PT Problem List). Pt will benefit from skilled PT to increase their independence and safety with mobility to allow discharge to the venue listed below.          Recommendations for follow up therapy are one component of a multi-disciplinary discharge planning process, led by the attending physician.  Recommendations may be updated based on patient status, additional functional criteria and insurance authorization.  Follow Up Recommendations Home health PT    Assistance Recommended at Discharge Set up Supervision/Assistance  Functional Status Assessment Patient has had a recent decline in their functional status and demonstrates the ability to make significant improvements in function in a reasonable and predictable amount of time.  Equipment Recommendations  Rolling walker (2 wheels)    Recommendations for Other Services       Precautions / Restrictions Precautions Precautions: Fall Restrictions Weight  Bearing Restrictions: No      Mobility  Bed Mobility Overal bed mobility: Independent                  Transfers Overall transfer level: Needs assistance Equipment used: 1 person hand held assist Transfers: Sit to/from Stand;Bed to chair/wheelchair/BSC Sit to Stand: Min guard Stand pivot transfers: Min guard         General transfer comment: Once pt to eOB, stated she had to use 3N1. Assisted pt to 3N1.Pt able to clean herself.    Ambulation/Gait Ambulation/Gait assistance: Min guard;Min assist Gait Distance (Feet): 250 Feet Assistive device: 1 person hand held assist;None Gait Pattern/deviations: Step-through pattern;Decreased stride length;Drifts right/left   Gait velocity interpretation: <1.8 ft/sec, indicate of risk for recurrent falls   General Gait Details: Pt was able to ambulate with min to min gaurd assist. At times, pt reaching for PT's hand for support as she is slihgtly off balance at times.Discussed using RW at home and pt is agreeable.  Stairs            Wheelchair Mobility    Modified Rankin (Stroke Patients Only)       Balance Overall balance assessment: Needs assistance Sitting-balance support: No upper extremity supported;Feet supported Sitting balance-Leahy Scale: Fair     Standing balance support: Single extremity supported;During functional activity Standing balance-Leahy Scale: Fair Standing balance comment: can stand statically without UE support, dynamically needs UE support.                             Pertinent Vitals/Pain Pain Assessment: No/denies pain    Home Living Family/patient expects to be discharged to:: Group  home   Available Help at Discharge: Friend(s);Available PRN/intermittently Type of Home: House Home Access: Stairs to enter   Entrance Stairs-Number of Steps: 5     Home Equipment: None      Prior Function Prior Level of Function : Independent/Modified Independent              Mobility Comments: Pt states she did everything for herself.       Hand Dominance        Extremity/Trunk Assessment   Upper Extremity Assessment Upper Extremity Assessment: Defer to OT evaluation    Lower Extremity Assessment Lower Extremity Assessment: Generalized weakness    Cervical / Trunk Assessment Cervical / Trunk Assessment: Normal  Communication   Communication: No difficulties  Cognition Arousal/Alertness: Lethargic Behavior During Therapy: Flat affect;Impulsive Overall Cognitive Status: History of cognitive impairments - at baseline                                 General Comments: Intiially agitated but after PT encouraged pt to get OOB, she agreed to walk in halls and had much different demeanor once she was up.        General Comments      Exercises     Assessment/Plan    PT Assessment Patient needs continued PT services  PT Problem List Decreased balance;Decreased activity tolerance;Decreased mobility;Decreased knowledge of use of DME;Decreased safety awareness       PT Treatment Interventions DME instruction;Gait training;Functional mobility training;Therapeutic activities;Therapeutic exercise;Balance training;Stair training;Patient/family education    PT Goals (Current goals can be found in the Care Plan section)  Acute Rehab PT Goals Patient Stated Goal: to return to group home PT Goal Formulation: With patient Time For Goal Achievement: 02/26/21 Potential to Achieve Goals: Good    Frequency Min 3X/week   Barriers to discharge Decreased caregiver support      Co-evaluation               AM-PAC PT "6 Clicks" Mobility  Outcome Measure Help needed turning from your back to your side while in a flat bed without using bedrails?: None Help needed moving from lying on your back to sitting on the side of a flat bed without using bedrails?: None Help needed moving to and from a bed to a chair (including a wheelchair)?: A  Little Help needed standing up from a chair using your arms (e.g., wheelchair or bedside chair)?: A Little Help needed to walk in hospital room?: A Little Help needed climbing 3-5 steps with a railing? : A Lot 6 Click Score: 19    End of Session Equipment Utilized During Treatment: Gait belt Activity Tolerance: Patient limited by fatigue Patient left: in chair;with call bell/phone within reach;with chair alarm set Nurse Communication: Mobility status PT Visit Diagnosis: Unsteadiness on feet (R26.81);Muscle weakness (generalized) (M62.81)    Time: 1202-1220 PT Time Calculation (min) (ACUTE ONLY): 18 min   Charges:   PT Evaluation $PT Eval Moderate Complexity: 1 Mod          Daysen Gundrum M,PT Acute Rehab Services [phone] (pager)   Alvira Philips 02/12/2021, 2:33 PM

## 2021-02-13 ENCOUNTER — Inpatient Hospital Stay (HOSPITAL_COMMUNITY): Payer: Medicare Other

## 2021-02-13 DIAGNOSIS — N83299 Other ovarian cyst, unspecified side: Secondary | ICD-10-CM | POA: Diagnosis not present

## 2021-02-13 DIAGNOSIS — B181 Chronic viral hepatitis B without delta-agent: Secondary | ICD-10-CM | POA: Diagnosis not present

## 2021-02-13 DIAGNOSIS — J690 Pneumonitis due to inhalation of food and vomit: Secondary | ICD-10-CM | POA: Diagnosis not present

## 2021-02-13 DIAGNOSIS — J9601 Acute respiratory failure with hypoxia: Secondary | ICD-10-CM | POA: Diagnosis not present

## 2021-02-13 LAB — CBC
HCT: 26 % — ABNORMAL LOW (ref 36.0–46.0)
Hemoglobin: 7.8 g/dL — ABNORMAL LOW (ref 12.0–15.0)
MCH: 26.3 pg (ref 26.0–34.0)
MCHC: 30 g/dL (ref 30.0–36.0)
MCV: 87.5 fL (ref 80.0–100.0)
Platelets: 526 10*3/uL — ABNORMAL HIGH (ref 150–400)
RBC: 2.97 MIL/uL — ABNORMAL LOW (ref 3.87–5.11)
RDW: 15.7 % — ABNORMAL HIGH (ref 11.5–15.5)
WBC: 18.8 10*3/uL — ABNORMAL HIGH (ref 4.0–10.5)
nRBC: 0 % (ref 0.0–0.2)

## 2021-02-13 IMAGING — DX DG CHEST 1V PORT
1 series · 1 of 1 positions shown · non-contrast
Comparison: [DATE]

CLINICAL DATA: Encounter for chest tube removal

EXAM:
PORTABLE CHEST 1 VIEW

[chest]
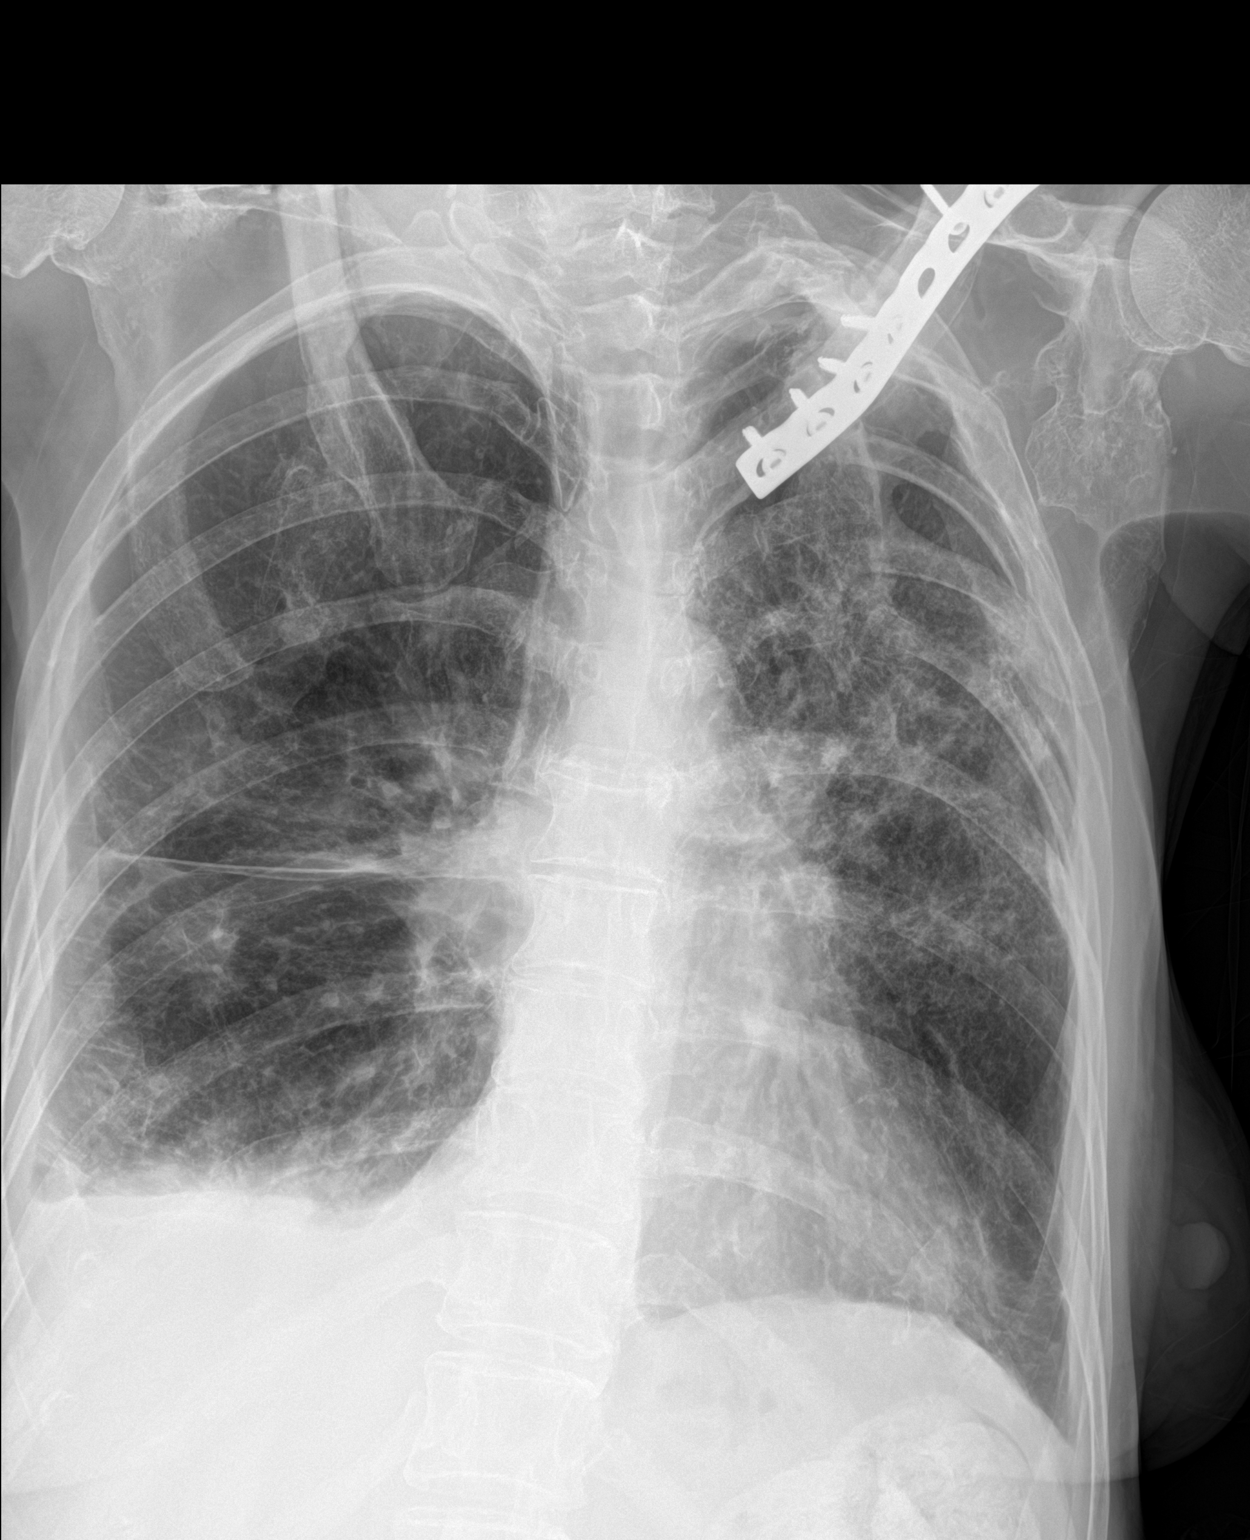

[1 of 1 positions shown; findings below may reference images not displayed]

FINDINGS: No pneumothorax is identified. Patchy consolidation of the left
upper lobe is slightly improved. There is minimal right pleural
effusion. Nodularity of the right upper lobe is unchanged.
Emphysematous changes of both lungs are identified. The mediastinal
contour and cardiac silhouette are stable. Bony structures are
stable.
IMPRESSION: No pneumothorax. Patchy consolidation of the left upper lobe is
slightly improved. Nodularity of the right upper lobe is unchanged.

## 2021-02-13 NOTE — Progress Notes (Signed)
NAME:  Margaret [name], MRN:  [id number], DOB:  1970-07-19, LOS: 41 ADMISSION DATE:  01/29/2021, CONSULTATION DATE:  10/30 REFERRING MD:  Roderic Palau, REASON FOR CONSULT: pleural effusion  History of Present Illness:  50 yo female presented to [facility] on 10/29 with difficulty walking and cough.  Found to have large partially loculated Rt pleural effusion.  Started on ABx.  Transferred to [facility] for chest tube placement and management.  Pertinent  Medical History  Ovarian mass,  hep B carrier, intelectual disability, TB (childhood), currently lives in group home  [facility] Events: Including procedures, antibiotic start and stop dates in addition to other pertinent events   10/29 Admit to [facility] CT chest shows R pleural effusion. 10/30-31 TXR to [facility]. 11/1 -placement of right fifth intercostal space 20 French chest tube 11/1-11/2 - received two doses of intrapleural fibrinolytic 11/4 with chest tube removed, CT chest showed residual posterior effusion 11/5 posterior pigtail catheter inserted and fibrinolytics restarted for enlarging right posterior effusion.  11/6  now on room air 11/11 chest tube discontinued  Interim History / Subjective:  No complaints this morning. Chest tube removed yesterday  Objective   Blood pressure (!) 93/59, pulse 98, temperature 98.1 F (36.7 C), temperature source Axillary, resp. rate 16, height 5' 4"  (1.626 m), weight 48 kg, SpO2 96 %.        Intake/Output Summary (Last 24 hours) at 02/13/2021 1029 Last data filed at 02/13/2021 0024 Gross per 24 hour  Intake 850 ml  Output --  Net 850 ml   Filed Weights   02/06/21 0500 02/07/21 0500 02/08/21 0600  Weight: 51.9 kg 48.8 kg 48 kg   Physical Exam: General: Well-appearing, no acute distress HENT: [place], AT, OP clear, MMM Eyes: EOMI, no scleral icterus Respiratory: Minimal scattered rhonchi Cardiovascular: RRR, -M/R/G, no JVD Extremities:-Edema,-tenderness Neuro: AAO x4, CNII-XII grossly intact  CXR  02/12/21 - no significant effusion on Rt.  Decreased b/l ASD  Assessment/Plan:   Rt empyema in setting of recurrent aspiration pneumonitis. - S/p chest tube 11/1-11/4, 11/5-11/11 - CXR reviewed. No recurrence of effusion - Recommend total 21 of ABx, currently on augmentin - Aspiration precautions  Rt adrenal mass. - will need follow up imaging as outpt  Rhinosinusitis. - continue ABx - continue nasal irrigation, flonase   Schizophrenia. Hepatitis B. Cystic ovarian lesion with elevated Ca-125. - per primary team  Pulmonary will sign off. Please call for any questions or concerns.  Labs:   CMP Latest Ref Rng & Units 02/12/2021 02/09/2021 02/09/2021  Glucose 70 - 99 mg/dL 133(H) 164(H) 178(H)  BUN 6 - 20 mg/dL 10 7 6   Creatinine 0.44 - 1.00 mg/dL 0.48 0.53 0.41(L)  Sodium 135 - 145 mmol/L 134(L) 136 135  Potassium 3.5 - 5.1 mmol/L 3.8 3.9 4.1  Chloride 98 - 111 mmol/L 98 97(L) 98  CO2 22 - 32 mmol/L 29 31 27   Calcium 8.9 - 10.3 mg/dL 8.4(L) 8.8(L) 8.4(L)  Total Protein 6.5 - 8.1 g/dL - - -  Total Bilirubin 0.3 - 1.2 mg/dL - - -  Alkaline Phos 38 - 126 U/L - - -  AST 15 - 41 U/L - - -  ALT 0 - 44 U/L - - -    CBC Latest Ref Rng & Units 02/13/2021 02/12/2021 02/09/2021  WBC 4.0 - 10.5 K/uL 18.8(H) 18.3(H) 14.8(H)  Hemoglobin 12.0 - 15.0 g/dL 7.8(L) 7.6(L) 8.1(L)  Hematocrit 36.0 - 46.0 % 26.0(L) 25.2(L) 27.1(L)  Platelets 150 - 400 K/uL 526(H) 517(H) 507(H)  Critical Care:    Care Time: 25 min  Rodman Pickle, M.D. [facility] Pulmonary/Critical Care Medicine 02/13/2021 10:29 AM   Please see Amion for pager number to reach on-call Pulmonary and Critical Care Team.

## 2021-02-13 NOTE — Progress Notes (Signed)
PROGRESS NOTE  Margaret [name] EVO:[id number] DOB: [birth year]   PCP: Dettinger, Fransisca Kaufmann, MD  Patient is from: Group home  DOA: 01/29/2021 LOS: 16  Chief complaints:  Chief Complaint  Patient presents with   Abdominal Pain     Brief Narrative / Interim history: 50 year old F with PMH of intellectual disability, childhood TB, schizophrenia, hepatitis B, ovarian cancer with elevated CA 125 presenting with progressive cough.  Initially admitted at [facility] for community-acquired pneumonia.  CT showed large loculated right pleural effusion.  She was transferred to [facility]. She underwent right chest tube placement on 11/12 by PCCM.  She had fibrinolytics on 11/1 and 11/2.  Chest tube removed on 11/4.  However, she had enlarging residual posterior effusion for which she required pigtail catheter from 11/5-11/11.  She also had additional fibrinolytics.  Remains on p.o. Augmentin for possible aspiration pneumonia with right empyema. Currently on room air.  PCCM recommended p.o. antibiotics for a total of 21 days with changes on 02/26/2021.  She also have a right adrenal mass and cystic ovarian lesion with elevated CEA 125 for which outpatient follow-up are recommended.  Patient is medically stable for discharge with home health whenever group home is able to take her back.  Subjective: Seen and examined earlier this morning.  No major events overnight of this morning.  She is a sleepy but wakes to voice easily.  She says she is tired.  Per RN, not a morning person.  She usually wakes up late in the morning and eats her breakfast.  Objective: Vitals:   02/13/21 0433 02/13/21 0832 02/13/21 1158 02/13/21 1206  BP: 92/62 (!) 93/59 (!) 86/61 98/69  Pulse: 98 98 100 100  Resp: 19 16 16    Temp: 98.3 F (36.8 C) 98.1 F (36.7 C) 97.8 F (36.6 C)   TempSrc: Oral Axillary Axillary   SpO2: 96% 96% 97%   Weight:      Height:        Intake/Output Summary (Last 24 hours) at 02/13/2021  1614 Last data filed at 02/13/2021 1157 Gross per 24 hour  Intake 610 ml  Output 2 ml  Net 608 ml   Filed Weights   02/06/21 0500 02/07/21 0500 02/08/21 0600  Weight: 51.9 kg 48.8 kg 48 kg    Examination:  GENERAL: No apparent distress.  Nontoxic. HEENT: MMM.  Vision and hearing grossly intact.  NECK: Supple.  No apparent JVD.  RESP: 97% on RA.  No IWOB.  Fair aeration bilaterally. CVS:  RRR. Heart sounds normal.  ABD/GI/GU: BS+. Abd soft, NTND.  MSK/EXT:  Moves extremities. No apparent deformity. No edema.  SKIN: no apparent skin lesion or wound NEURO: Sleepy but wakes to voice.  Fairly oriented.  No apparent focal neuro deficit. PSYCH: Calm. Normal affect.   Procedures:  11/1-placement of right chest tube 11/5-11/11- posterior pigtail catheter  Microbiology summarized: 10/29-COVID-19 and influenza PCR nonreactive. 10/29-blood cultures negative. 10/31-full RVP and MRSA PCR screen negative. 11/1-blood cultures negative.  Assessment & Plan: Recurrent aspiration pneumonia with right empyema -S/p chest tube and pigtail as above.  Follow-up cultures NGTD. -Repeat CXR on 11/11 with patchy infiltrates bilaterally, small bilateral pleural effusions and no PTX. -Still with leukocytosis. -CTX and Zithromax>>> p.o. Augmentin for a total of 3 weeks which ended on 02/26/2021 per PCCM -Evaluated by SLP and deemed to have mild aspiration risk.  SLP recommended regular clear liquid diet.    Right adrenal mass Cystic right ovarian lesion with elevated CEA 125 -Outpatient  follow-up.  Schizophrenia: Somewhat stable.  -Continue chlorpromazine, benztropine and Prozac.   Hypokalemia: Resolved.  Normocytic anemia: H&H relatively stable. Recent Labs    02/03/21 0133 02/04/21 0253 02/05/21 0552 02/06/21 0653 02/07/21 0127 02/08/21 0012 02/09/21 0152 02/09/21 1109 02/12/21 0102 02/13/21 0140  HGB 8.3* 7.6* 7.6* 7.3* 7.2* 8.1* 7.8* 8.1* 7.6* 7.8*  -Continue  monitoring -Transfuse for Hgb less than 7.0.  History of hepatitis B infection -Continue home Entecavir.  Intellectual disability: From group home.  Seems to be fairly oriented but does not seem to have great insight.  Not fully cooperative.  Leukocytosis/bandemia/thrombocytosis: Could be from infection and possible malignancy -Continue monitoring  Physical deconditioning -PT/OT if she cooperates.  Severe malnutrition: POA Body mass index is 18.16 kg/m. Nutrition Problem: Severe Malnutrition Etiology: chronic illness (met Ovarian cancer) Signs/Symptoms: severe muscle depletion, severe fat depletion, percent weight loss Percent weight loss: 15.3 % Interventions: Ensure Enlive (each supplement provides 350kcal and 20 grams of protein), MVI   DVT prophylaxis:  heparin injection 5,000 Units Start: 01/30/21 1400 SCDs Start: 01/30/21 1003  Code Status: Full code Family Communication: Patient and/or RN. Available if any question.  Level of care: Med-Surg Status is: Inpatient  Remains inpatient appropriate because: Safe disposition   Final disposition: Back to group home when they can take her back.  Medically stable for discharge    Consultants:  Pulmonology-signed off   Sch Meds:  Scheduled Meds:  amoxicillin-clavulanate  1 tablet Oral Q12H   benztropine  1 mg Oral BID   Chlorhexidine Gluconate Cloth  6 each Topical Daily   chlorproMAZINE  50 mg Oral BID   cloZAPine  250 mg Oral QHS   entecavir  0.5 mg Oral Daily   feeding supplement  237 mL Oral TID BM   FLUoxetine  20 mg Oral Daily   fluticasone  1 spray Each Nare Daily   heparin  5,000 Units Subcutaneous Q8H   mirtazapine  7.5 mg Oral QHS   multivitamin with minerals  1 tablet Oral Daily   polyethylene glycol  17 g Oral Daily   senna-docusate  1 tablet Oral Daily   sodium chloride  1 spray Each Nare BID   sodium chloride flush  10 mL Other Q8H   Continuous Infusions: PRN Meds:.acetaminophen, albuterol,  fentaNYL (SUBLIMAZE) injection, hydrOXYzine, morphine injection  Antimicrobials: Anti-infectives (From admission, onward)    Start     Dose/Rate Route Frequency Ordered Stop   02/05/21 1000  amoxicillin-clavulanate (AUGMENTIN) 875-125 MG per tablet 1 tablet        1 tablet Oral Every 12 hours 02/05/21 0818 02/26/21 0959   02/03/21 1700  entecavir (BARACLUDE) tablet 0.5 mg        0.5 mg Oral Daily 02/03/21 1321     02/02/21 1030  cefTRIAXone (ROCEPHIN) 2 g in sodium chloride 0.9 % 100 mL IVPB  Status:  Discontinued        2 g 200 mL/hr over 30 Minutes Intravenous Every 24 hours 02/02/21 0941 02/05/21 0818   02/01/21 1100  Ampicillin-Sulbactam (UNASYN) 3 g in sodium chloride 0.9 % 100 mL IVPB  Status:  Discontinued        3 g 200 mL/hr over 30 Minutes Intravenous Every 6 hours 02/01/21 1007 02/02/21 0941   01/31/21 0500  cefTRIAXone (ROCEPHIN) 2 g in sodium chloride 0.9 % 100 mL IVPB  Status:  Discontinued        2 g 200 mL/hr over 30 Minutes Intravenous Every 24 hours 01/30/21 1004 02/01/21  1007   01/30/21 1015  entecavir (BARACLUDE) tablet 0.5 mg  Status:  Discontinued       Note to Pharmacy: TAKE (1) TABLET BY MOUTH ONCE DAILY.     0.5 mg Oral Daily 01/30/21 1004 02/02/21 0925   01/30/21 0515  cefTRIAXone (ROCEPHIN) 2 g in sodium chloride 0.9 % 100 mL IVPB        2 g 200 mL/hr over 30 Minutes Intravenous  Once 01/30/21 0509 01/30/21 0615   01/30/21 0515  azithromycin (ZITHROMAX) 500 mg in sodium chloride 0.9 % 250 mL IVPB  Status:  Discontinued        500 mg 250 mL/hr over 60 Minutes Intravenous Every 24 hours 01/30/21 0509 02/02/21 1357   01/30/21 0315  doxycycline (VIBRA-TABS) tablet 100 mg        100 mg Oral  Once 01/30/21 0314 01/30/21 0328        I have personally reviewed the following labs and images: CBC: Recent Labs  Lab 02/08/21 0012 02/09/21 0152 02/09/21 1109 02/12/21 0102 02/13/21 0140  WBC 22.0* 17.1* 14.8* 18.3* 18.8*  HGB 8.1* 7.8* 8.1* 7.6* 7.8*  HCT  26.3* 25.9* 27.1* 25.2* 26.0*  MCV 87.1 86.9 87.1 88.1 87.5  PLT 418* 465* 507* 517* 526*   BMP &GFR Recent Labs  Lab 02/09/21 0152 02/09/21 1109 02/12/21 0102  NA 135 136 134*  K 4.1 3.9 3.8  CL 98 97* 98  CO2 27 31 29   GLUCOSE 178* 164* 133*  BUN 6 7 10   CREATININE 0.41* 0.53 0.48  CALCIUM 8.4* 8.8* 8.4*  MG 1.8 2.0 2.1  PHOS  --   --  3.6   Estimated Creatinine Clearance: 65.2 mL/min (by C-G formula based on SCr of 0.48 mg/dL). Liver & Pancreas: Recent Labs  Lab 02/12/21 0102  ALBUMIN 1.9*   No results for input(s): LIPASE, AMYLASE in the last 168 hours. No results for input(s): AMMONIA in the last 168 hours. Diabetic: No results for input(s): HGBA1C in the last 72 hours. No results for input(s): GLUCAP in the last 168 hours. Cardiac Enzymes: No results for input(s): CKTOTAL, CKMB, CKMBINDEX, TROPONINI in the last 168 hours. No results for input(s): PROBNP in the last 8760 hours. Coagulation Profile: No results for input(s): INR, PROTIME in the last 168 hours. Thyroid Function Tests: No results for input(s): TSH, T4TOTAL, FREET4, T3FREE, THYROIDAB in the last 72 hours. Lipid Profile: No results for input(s): CHOL, HDL, LDLCALC, TRIG, CHOLHDL, LDLDIRECT in the last 72 hours. Anemia Panel: Recent Labs    02/12/21 0102  VITAMINB12 465  FOLATE 12.5  FERRITIN 292  TIBC 230*  IRON 20*  RETICCTPCT 4.3*   Urine analysis:    Component Value Date/Time   COLORURINE YELLOW 01/30/2021 0504   APPEARANCEUR CLEAR 01/30/2021 0504   APPEARANCEUR Clear 06/30/2016 1013   LABSPEC 1.043 (H) 01/30/2021 0504   PHURINE 6.0 01/30/2021 0504   GLUCOSEU NEGATIVE 01/30/2021 0504   HGBUR NEGATIVE 01/30/2021 0504   BILIRUBINUR NEGATIVE 01/30/2021 0504   BILIRUBINUR Negative 06/30/2016 [place] 01/30/2021 0504   PROTEINUR NEGATIVE 01/30/2021 0504   UROBILINOGEN 0.2 04/13/2011 0330   NITRITE POSITIVE (A) 01/30/2021 0504   LEUKOCYTESUR NEGATIVE 01/30/2021 0504    Sepsis Labs: Invalid input(s): PROCALCITONIN, [place]  Microbiology: No results found for this or any previous visit (from the past 240 hour(s)).   Radiology Studies: DG CHEST PORT 1 VIEW  Result Date: 02/13/2021 CLINICAL DATA:  Encounter for chest tube removal EXAM: PORTABLE  CHEST 1 VIEW COMPARISON:  February 12, 2021 FINDINGS: No pneumothorax is identified. Patchy consolidation of the left upper lobe is slightly improved. There is minimal right pleural effusion. Nodularity of the right upper lobe is unchanged. Emphysematous changes of both lungs are identified. The mediastinal contour and cardiac silhouette are stable. Bony structures are stable. IMPRESSION: No pneumothorax. Patchy consolidation of the left upper lobe is slightly improved. Nodularity of the right upper lobe is unchanged. Electronically Signed   By: Abelardo Diesel M.D.   On: 02/13/2021 08:30      Shamiah Kahler T. [place]  If 7PM-7AM, please contact night-coverage [email or web address] 02/13/2021, 4:14 PM

## 2021-02-14 DIAGNOSIS — N83299 Other ovarian cyst, unspecified side: Secondary | ICD-10-CM | POA: Diagnosis not present

## 2021-02-14 DIAGNOSIS — J690 Pneumonitis due to inhalation of food and vomit: Secondary | ICD-10-CM | POA: Diagnosis not present

## 2021-02-14 DIAGNOSIS — J9601 Acute respiratory failure with hypoxia: Secondary | ICD-10-CM | POA: Diagnosis not present

## 2021-02-14 DIAGNOSIS — B181 Chronic viral hepatitis B without delta-agent: Secondary | ICD-10-CM | POA: Diagnosis not present

## 2021-02-14 MED ORDER — COVID-19MRNA BIVAL VAC MODERNA 50 MCG/0.5ML IM SUSP
0.5000 mL | Freq: Once | INTRAMUSCULAR | Status: AC
  Administered 2021-02-15: 0.5 mL via INTRAMUSCULAR
  Filled 2021-02-14: qty 0.5

## 2021-02-14 NOTE — Evaluation (Signed)
Occupational Therapy Evaluation Patient Details Name: Margaret [name] MRN: [id number] DOB: 1970-08-17 Today's Date: 02/14/2021   History of Present Illness 50 year old F admitted 10/28 initially admitted at [facility] for community-acquired pneumonia.  CT showed large loculated right pleural effusion.  She was transferred to [facility] 10/30. She had fibrinolytics on 11/1 and 11/2.  Chest tube removed on 11/4.  However, she had enlarging residual posterior effusion for which she required pigtail catheter chest tube from 11/5-11/11. PMH of intellectual disability, childhood TB, schizophrenia, hepatitis B, ovarian cancer with elevated CA 125   Clinical Impression   Pt is typically independent in ADL in group home. Pt today is overall min guard for ADL and transfers without DME. She was able to don/doff socks EOB, walk to bathroom for oral care and face washing, transfer to toilet and perform peri care, then perform hand hygeine at sink. Pt performed all of this at close min guard with no LOB. Pt will benefit from skilled OT in the acute setting and afterwards at the [facility]  level in her group home to ensure that she is able to perform her self-care at her PLOF in her own environment. Pt enjoyed talking about [place] this session.       Recommendations for follow up therapy are one component of a multi-disciplinary discharge planning process, led by the attending physician.  Recommendations may be updated based on patient status, additional functional criteria and insurance authorization.   Follow Up Recommendations  Home health OT (at group home)    Assistance Recommended at Discharge Intermittent Supervision/Assistance  Functional Status Assessment  Patient has had a recent decline in their functional status and demonstrates the ability to make significant improvements in function in a reasonable and predictable amount of time.  Equipment Recommendations  BSC/3in1 (as shower chair)     Recommendations for Other Services PT consult     Precautions / Restrictions Precautions Precautions: Fall Restrictions Weight Bearing Restrictions: No      Mobility Bed Mobility Overal bed mobility: Independent                  Transfers Overall transfer level: Needs assistance Equipment used: None Transfers: Sit to/from Stand;Bed to chair/wheelchair/BSC Sit to Stand: Min guard Stand pivot transfers: Min guard         General transfer comment: Pt close min guard for safety with transfers, balance unsteady but no overt LOB      Balance Overall balance assessment: Needs assistance Sitting-balance support: No upper extremity supported;Feet supported Sitting balance-Leahy Scale: Fair     Standing balance support: Single extremity supported;During functional activity Standing balance-Leahy Scale: Fair Standing balance comment: improved with SUE support - reaches out for environmental support                           ADL either performed or assessed with clinical judgement   ADL Overall ADL's : Needs assistance/impaired Eating/Feeding: Independent   Grooming: Wash/dry hands;Wash/dry face;Oral care;Min guard;Standing Grooming Details (indicate cue type and reason): sink level Upper Body Bathing: Min guard;Sitting Upper Body Bathing Details (indicate cue type and reason): sitting for balance safety Lower Body Bathing: Min guard;Sitting/lateral leans   Upper Body Dressing : Set up;Sitting   Lower Body Dressing: Min guard;Sitting/lateral leans Lower Body Dressing Details (indicate cue type and reason): to don socks Toilet Transfer: Min guard;Ambulation Toilet Transfer Details (indicate cue type and reason): close min guard with gait belt Toileting- Clothing Manipulation and  Hygiene: Min guard;Sit to/from stand Toileting - Clothing Manipulation Details (indicate cue type and reason): use of grab bar for balance     Functional mobility during  ADLs: Min guard (very close for safety)       Vision Ability to See in Adequate Light: 0 Adequate Vision Assessment?: No apparent visual deficits     Perception     Praxis      Pertinent Vitals/Pain Pain Assessment: No/denies pain Breathing: normal Negative Vocalization: none Facial Expression: smiling or inexpressive Body Language: relaxed Consolability: no need to console PAINAD Score: 0     Hand Dominance     Extremity/Trunk Assessment Upper Extremity Assessment Upper Extremity Assessment: Overall WFL for tasks assessed   Lower Extremity Assessment Lower Extremity Assessment: Defer to PT evaluation   Cervical / Trunk Assessment Cervical / Trunk Assessment: Normal   Communication Communication Communication: No difficulties   Cognition Arousal/Alertness: Awake/alert Behavior During Therapy: Flat affect;Impulsive Overall Cognitive Status: History of cognitive impairments - at baseline                                 General Comments: utilized functional ADL to encourage Pt for OOB activity.     General Comments       Exercises     Shoulder Instructions      Home Living Family/patient expects to be discharged to:: Group home   Available Help at Discharge: Friend(s);Available PRN/intermittently Type of Home: House Home Access: Stairs to enter Entrance Stairs-Number of Steps: 5                   Home Equipment: None          Prior Functioning/Environment Prior Level of Function : Independent/Modified Independent             Mobility Comments: Pt states she did everything for herself.          OT Problem List: Decreased activity tolerance;Impaired balance (sitting and/or standing);Decreased safety awareness      OT Treatment/Interventions: Self-care/ADL training;Therapeutic activities;Patient/family education;Balance training    OT Goals(Current goals can be found in the care plan section) Acute Rehab OT  Goals Patient Stated Goal: watch TV OT Goal Formulation: With patient Time For Goal Achievement: 02/28/21 Potential to Achieve Goals: Good ADL Goals Pt Will Perform Grooming: with modified independence;standing Pt Will Perform Upper Body Dressing: with modified independence;sitting Pt Will Perform Lower Body Dressing: with modified independence;sit to/from stand Pt Will Transfer to Toilet: with modified independence;ambulating Pt Will Perform Toileting - Clothing Manipulation and hygiene: with modified independence;sit to/from stand  OT Frequency: Min 2X/week   Barriers to D/C:            Co-evaluation              AM-PAC OT "6 Clicks" Daily Activity     Outcome Measure Help from another person eating meals?: None Help from another person taking care of personal grooming?: A Little Help from another person toileting, which includes using toliet, bedpan, or urinal?: A Little Help from another person bathing (including washing, rinsing, drying)?: A Little Help from another person to put on and taking off regular upper body clothing?: None Help from another person to put on and taking off regular lower body clothing?: A Little 6 Click Score: 20   End of Session Equipment Utilized During Treatment: Gait belt Nurse Communication: Mobility status;Precautions  Activity Tolerance: Patient tolerated treatment  well Patient left: in bed;with call bell/phone within reach;with bed alarm set (door open)  OT Visit Diagnosis: Unsteadiness on feet (R26.81);Muscle weakness (generalized) (M62.81)                Time: 2585-2778 OT Time Calculation (min): 12 min Charges:  OT General Charges $OT Visit: 1 Visit OT Evaluation $OT Eval Low Complexity: [place] OTR/L Acute Rehabilitation Services Pager: [phone] Office: [place] 02/14/2021, 5:09 PM

## 2021-02-14 NOTE — Progress Notes (Signed)
PROGRESS NOTE  Margaret [name] LKG:[id number] DOB: 05-Jan-1971   PCP: Dettinger, Fransisca Kaufmann, MD  Patient is from: Group home  DOA: 01/29/2021 LOS: 46  Chief complaints:  Chief Complaint  Patient presents with   Abdominal Pain     Brief Narrative / Interim history: 50 year old F with PMH of intellectual disability, childhood TB, schizophrenia, hepatitis B, ovarian cancer with elevated CA 125 presenting with progressive cough.  Initially admitted at [facility] for community-acquired pneumonia.  CT showed large loculated right pleural effusion.  She was transferred to [facility]. She underwent right chest tube placement on 11/12 by PCCM.  She had fibrinolytics on 11/1 and 11/2.  Chest tube removed on 11/4.  However, she had enlarging residual posterior effusion for which she required pigtail catheter from 11/5-11/11.  She also had additional fibrinolytics.  Remains on p.o. Augmentin for possible aspiration pneumonia with right empyema. Currently on room air.  PCCM recommended p.o. antibiotics for a total of 21 days which ends on 02/26/2021.  She also have a right adrenal mass and cystic ovarian lesion with elevated CEA 125 for which outpatient follow-up are recommended.  Patient is medically stable for discharge with home health whenever group home is able to take her back.  Subjective: Seen and examined earlier this morning.  No major events overnight of this morning.  Sitting on bedside chair.  Asking for more Kleenex tissue and Coke.  Denies chest pain or trouble breathing.  Not a reliable historian but seems to be fairly oriented.  Objective: Vitals:   02/13/21 2349 02/14/21 0500 02/14/21 0905 02/14/21 1216  BP: 97/68  (!) 98/56 95/66  Pulse: (!) 101  (!) 103 100  Resp: 18  18 18   Temp: 98.5 F (36.9 C)  97.6 F (36.4 C) 97.6 F (36.4 C)  TempSrc: Oral Other (Comment) Oral Oral  SpO2: 99%  100% 99%  Weight:      Height:        Intake/Output Summary (Last 24 hours) at  02/14/2021 1308 Last data filed at 02/14/2021 0917 Gross per 24 hour  Intake 270 ml  Output --  Net 270 ml   Filed Weights   02/06/21 0500 02/07/21 0500 02/08/21 0600  Weight: 51.9 kg 48.8 kg 48 kg    Examination:  GENERAL: No apparent distress.  Nontoxic. HEENT: MMM.  Vision and hearing grossly intact.  NECK: Supple.  No apparent JVD.  RESP: On RA.  No IWOB.  Fair aeration bilaterally. CVS:  RRR. Heart sounds normal.  ABD/GI/GU: BS+. Abd soft, NTND.  MSK/EXT:  Moves extremities. No apparent deformity. No edema.  SKIN: no apparent skin lesion or wound NEURO: Awake and alert. Oriented fairly.  No apparent focal neuro deficit. PSYCH: Calm. Normal affect.   Procedures:  11/1-placement of right chest tube 11/5-11/11- posterior pigtail catheter  Microbiology summarized: 10/29-COVID-19 and influenza PCR nonreactive. 10/29-blood cultures negative. 10/31-full RVP and MRSA PCR screen negative. 11/1-blood cultures negative.  Assessment & Plan: Recurrent aspiration pneumonia with right empyema -S/p chest tube and pigtail as above.  Follow-up cultures NGTD. -Repeat CXR on 11/11 with patchy infiltrates bilaterally, small bilateral pleural effusions and no PTX. -Still with leukocytosis. -CTX and Zithromax>>> p.o. Augmentin for a total of 3 weeks which ends on 02/26/2021 per PCCM -Evaluated by SLP and deemed to have mild aspiration risk.  SLP recommended regular clear liquid diet.    Right adrenal mass Cystic right ovarian lesion with elevated CEA 125 -Outpatient follow-up.  Schizophrenia: Somewhat stable.  -Continue chlorpromazine,  benztropine and Prozac.   Hypokalemia: Resolved.  Normocytic anemia: H&H relatively stable. Recent Labs    02/03/21 0133 02/04/21 0253 02/05/21 0552 02/06/21 0653 02/07/21 0127 02/08/21 0012 02/09/21 0152 02/09/21 1109 02/12/21 0102 02/13/21 0140  HGB 8.3* 7.6* 7.6* 7.3* 7.2* 8.1* 7.8* 8.1* 7.6* 7.8*  -Continue monitoring -Transfuse  for Hgb less than 7.0.  History of hepatitis B infection -Continue home Entecavir.  Intellectual disability: From group home.  Seems to be fairly oriented but does not seem to have great insight.  Not fully cooperative.  Leukocytosis/bandemia/thrombocytosis: Could be from infection and possible malignancy -Continue monitoring  Physical deconditioning -PT/OT if she cooperates.  Severe malnutrition: POA Body mass index is 18.16 kg/m. Nutrition Problem: Severe Malnutrition Etiology: chronic illness (met Ovarian cancer) Signs/Symptoms: severe muscle depletion, severe fat depletion, percent weight loss Percent weight loss: 15.3 % Interventions: Ensure Enlive (each supplement provides 350kcal and 20 grams of protein), MVI   DVT prophylaxis:  heparin injection 5,000 Units Start: 01/30/21 1400 SCDs Start: 01/30/21 1003  Code Status: Full code Family Communication: Patient and/or RN. Available if any question.  Level of care: Med-Surg Status is: Inpatient  Remains inpatient appropriate because: Safe disposition   Final disposition: Back to group home when they can take her back.  Medically stable for discharge    Consultants:  Pulmonology-signed off   Sch Meds:  Scheduled Meds:  amoxicillin-clavulanate  1 tablet Oral Q12H   benztropine  1 mg Oral BID   Chlorhexidine Gluconate Cloth  6 each Topical Daily   chlorproMAZINE  50 mg Oral BID   cloZAPine  250 mg Oral QHS   entecavir  0.5 mg Oral Daily   feeding supplement  237 mL Oral TID BM   FLUoxetine  20 mg Oral Daily   fluticasone  1 spray Each Nare Daily   heparin  5,000 Units Subcutaneous Q8H   mirtazapine  7.5 mg Oral QHS   multivitamin with minerals  1 tablet Oral Daily   polyethylene glycol  17 g Oral Daily   senna-docusate  1 tablet Oral Daily   sodium chloride  1 spray Each Nare BID   sodium chloride flush  10 mL Other Q8H   Continuous Infusions: PRN Meds:.acetaminophen, albuterol, fentaNYL (SUBLIMAZE)  injection, hydrOXYzine, morphine injection  Antimicrobials: Anti-infectives (From admission, onward)    Start     Dose/Rate Route Frequency Ordered Stop   02/05/21 1000  amoxicillin-clavulanate (AUGMENTIN) 875-125 MG per tablet 1 tablet        1 tablet Oral Every 12 hours 02/05/21 0818 02/26/21 0959   02/03/21 1700  entecavir (BARACLUDE) tablet 0.5 mg        0.5 mg Oral Daily 02/03/21 1321     02/02/21 1030  cefTRIAXone (ROCEPHIN) 2 g in sodium chloride 0.9 % 100 mL IVPB  Status:  Discontinued        2 g 200 mL/hr over 30 Minutes Intravenous Every 24 hours 02/02/21 0941 02/05/21 0818   02/01/21 1100  Ampicillin-Sulbactam (UNASYN) 3 g in sodium chloride 0.9 % 100 mL IVPB  Status:  Discontinued        3 g 200 mL/hr over 30 Minutes Intravenous Every 6 hours 02/01/21 1007 02/02/21 0941   01/31/21 0500  cefTRIAXone (ROCEPHIN) 2 g in sodium chloride 0.9 % 100 mL IVPB  Status:  Discontinued        2 g 200 mL/hr over 30 Minutes Intravenous Every 24 hours 01/30/21 1004 02/01/21 1007   01/30/21 1015  entecavir (BARACLUDE)  tablet 0.5 mg  Status:  Discontinued       Note to Pharmacy: TAKE (1) TABLET BY MOUTH ONCE DAILY.     0.5 mg Oral Daily 01/30/21 1004 02/02/21 0925   01/30/21 0515  cefTRIAXone (ROCEPHIN) 2 g in sodium chloride 0.9 % 100 mL IVPB        2 g 200 mL/hr over 30 Minutes Intravenous  Once 01/30/21 0509 01/30/21 0615   01/30/21 0515  azithromycin (ZITHROMAX) 500 mg in sodium chloride 0.9 % 250 mL IVPB  Status:  Discontinued        500 mg 250 mL/hr over 60 Minutes Intravenous Every 24 hours 01/30/21 0509 02/02/21 1357   01/30/21 0315  doxycycline (VIBRA-TABS) tablet 100 mg        100 mg Oral  Once 01/30/21 0314 01/30/21 0328        I have personally reviewed the following labs and images: CBC: Recent Labs  Lab 02/08/21 0012 02/09/21 0152 02/09/21 1109 02/12/21 0102 02/13/21 0140  WBC 22.0* 17.1* 14.8* 18.3* 18.8*  HGB 8.1* 7.8* 8.1* 7.6* 7.8*  HCT 26.3* 25.9* 27.1* 25.2*  26.0*  MCV 87.1 86.9 87.1 88.1 87.5  PLT 418* 465* 507* 517* 526*   BMP &GFR Recent Labs  Lab 02/09/21 0152 02/09/21 1109 02/12/21 0102  NA 135 136 134*  K 4.1 3.9 3.8  CL 98 97* 98  CO2 27 31 29   GLUCOSE 178* 164* 133*  BUN 6 7 10   CREATININE 0.41* 0.53 0.48  CALCIUM 8.4* 8.8* 8.4*  MG 1.8 2.0 2.1  PHOS  --   --  3.6   Estimated Creatinine Clearance: 65.2 mL/min (by C-G formula based on SCr of 0.48 mg/dL). Liver & Pancreas: Recent Labs  Lab 02/12/21 0102  ALBUMIN 1.9*   No results for input(s): LIPASE, AMYLASE in the last 168 hours. No results for input(s): AMMONIA in the last 168 hours. Diabetic: No results for input(s): HGBA1C in the last 72 hours. No results for input(s): GLUCAP in the last 168 hours. Cardiac Enzymes: No results for input(s): CKTOTAL, CKMB, CKMBINDEX, TROPONINI in the last 168 hours. No results for input(s): PROBNP in the last 8760 hours. Coagulation Profile: No results for input(s): INR, PROTIME in the last 168 hours. Thyroid Function Tests: No results for input(s): TSH, T4TOTAL, FREET4, T3FREE, THYROIDAB in the last 72 hours. Lipid Profile: No results for input(s): CHOL, HDL, LDLCALC, TRIG, CHOLHDL, LDLDIRECT in the last 72 hours. Anemia Panel: Recent Labs    02/12/21 0102  VITAMINB12 465  FOLATE 12.5  FERRITIN 292  TIBC 230*  IRON 20*  RETICCTPCT 4.3*   Urine analysis:    Component Value Date/Time   COLORURINE YELLOW 01/30/2021 0504   APPEARANCEUR CLEAR 01/30/2021 0504   APPEARANCEUR Clear 06/30/2016 1013   LABSPEC 1.043 (H) 01/30/2021 0504   PHURINE 6.0 01/30/2021 0504   GLUCOSEU NEGATIVE 01/30/2021 0504   HGBUR NEGATIVE 01/30/2021 0504   BILIRUBINUR NEGATIVE 01/30/2021 0504   BILIRUBINUR Negative 06/30/2016 [place] 01/30/2021 0504   PROTEINUR NEGATIVE 01/30/2021 0504   UROBILINOGEN 0.2 04/13/2011 0330   NITRITE POSITIVE (A) 01/30/2021 0504   LEUKOCYTESUR NEGATIVE 01/30/2021 0504   Sepsis Labs: Invalid  input(s): PROCALCITONIN, [place]  Microbiology: No results found for this or any previous visit (from the past 240 hour(s)).   Radiology Studies: No results found.    Donta Mcinroy [place]  If 7PM-7AM, please contact night-coverage [email or web address] 02/14/2021, 1:08 PM

## 2021-02-14 NOTE — Progress Notes (Shared)
[facility] [address] [place] [address]   CLINIC:  Medical Oncology/Hematology  CONSULT NOTE  Patient Care Team: Dettinger, Fransisca Kaufmann, MD as PCP - General (Family Medicine) Rogene Houston, MD as Consulting Physician (Gastroenterology)  CHIEF COMPLAINTS/PURPOSE OF CONSULTATION:  ***  HISTORY OF PRESENTING ILLNESS:  [name] 50 y.o. female is here because of ***, at the request of {REFERRING PHYSICIAN}.  MEDICAL HISTORY:  Past Medical History:  Diagnosis Date   Hematuria 11/12/2014   Hepatitis B carrier (HCC)    Hypothyroidism    Impulse disorder    IUD (intrauterine device) in place 11/12/2014   Inserted 12/24/12 at [facility]   Mild intellectual disability    Schizophrenia ([facility])    Schizophrenia ([place])    Tuberculosis    as a child   Urinary frequency 11/12/2014    SURGICAL HISTORY: Past Surgical History:  Procedure Laterality Date   ARM WOUND REPAIR / CLOSURE     TRACHEAL SURGERY     TRACHEOSTOMY      SOCIAL HISTORY: Social History   Socioeconomic History   Marital status: Single    Spouse name: Not on file   Number of children: 0   Years of education: 12   Highest education level: High school graduate  Occupational History   Occupation: disabled    Comment: due to schizophrenia  Tobacco Use   Smoking status: Never   Smokeless tobacco: Never  Vaping Use   Vaping Use: Never used  Substance and Sexual Activity   Alcohol use: No    Alcohol/week: 0.0 standard drinks   Drug use: No   Sexual activity: Not Currently    Birth control/protection: I.U.D.  Other Topics Concern   Not on file  Social History Narrative   Patient was adopted. She does not have any children and has never been married. She lives in a group home and is disabled. Has a diagnosis of schizophrenia. She socially isolates herself, doesn't like to participate in group activities, sleeps a lot, has to be prompted to bathe, eat, dress, etc. -07/22/20   Social  Determinants of Health   Financial Resource Strain: Low Risk    Difficulty of Paying Living Expenses: Not hard at all  Food Insecurity: No Food Insecurity   Worried About Charity fundraiser in the Last Year: Never true   [place] in the Last Year: Never true  Transportation Needs: No Transportation Needs   Lack of Transportation (Medical): No   Lack of Transportation (Non-Medical): No  Physical Activity: Insufficiently Active   Days of Exercise per Week: 1 day   Minutes of Exercise per Session: 20 min  Stress: Stress Concern Present   Feeling of Stress : To some extent  Social Connections: Socially Isolated   Frequency of Communication with Friends and Family: More than three times a week   Frequency of Social Gatherings with Friends and Family: More than three times a week   Attends Religious Services: Never   Marine scientist or Organizations: No   Attends Music therapist: Never   Marital Status: Never married  Human resources officer Violence: Not At Risk   Fear of Current or Ex-Partner: No   Emotionally Abused: No   Physically Abused: No   Sexually Abused: No    FAMILY HISTORY: Family History  Adopted: Yes  Family history unknown: Yes    ALLERGIES:  has No Known Allergies.  MEDICATIONS:  No current facility-administered  medications for this visit.   No current outpatient medications on file.   Facility-Administered Medications Ordered in Other Visits  Medication Dose Route Frequency Provider Last Rate Last Admin   acetaminophen (TYLENOL) tablet 650 mg  650 mg Oral Q6H PRN Kathie Dike, MD   650 mg at 02/11/21 1834   albuterol (PROVENTIL) (2.5 MG/3ML) 0.083% nebulizer solution 2.5 mg  2.5 mg Nebulization Q4H PRN Margaretmary Lombard, MD   2.5 mg at 02/06/21 0518   amoxicillin-clavulanate (AUGMENTIN) 875-125 MG per tablet 1 tablet  1 tablet Oral Q12H Candee Furbish, MD   1 tablet at 02/13/21 2159   benztropine (COGENTIN) tablet 1 mg  1 mg Oral BID  Zierle-Ghosh, Asia B, DO   1 mg at 02/13/21 2159   Chlorhexidine Gluconate Cloth 2 % PADS 6 each  6 each Topical Daily Margaretmary Lombard, MD   6 each at 02/12/21 1744   chlorproMAZINE (THORAZINE) tablet 50 mg  50 mg Oral BID Candee Furbish, MD   50 mg at 02/13/21 2200   cloZAPine (CLOZARIL) tablet 250 mg  250 mg Oral QHS Zierle-Ghosh, Asia B, DO   250 mg at 02/13/21 2159   entecavir (BARACLUDE) tablet 0.5 mg  0.5 mg Oral Daily Agarwala, Einar Grad, MD   0.5 mg at 02/13/21 0956   feeding supplement (ENSURE ENLIVE / ENSURE PLUS) liquid 237 mL  237 mL Oral TID BM Candee Furbish, MD   237 mL at 02/12/21 0952   fentaNYL (SUBLIMAZE) injection 25 mcg  25 mcg Intravenous Q2H PRN Tilden Dome, MD   25 mcg at 02/04/21 1332   FLUoxetine (PROZAC) capsule 20 mg  20 mg Oral Daily Agarwala, Einar Grad, MD   20 mg at 02/13/21 0956   fluticasone (FLONASE) 50 MCG/ACT nasal spray 1 spray  1 spray Each Nare Daily Chesley Mires, MD   1 spray at 02/13/21 0959   heparin injection 5,000 Units  5,000 Units Subcutaneous Q8H Zierle-Ghosh, Asia B, DO   5,000 Units at 02/14/21 8850   hydrOXYzine (ATARAX/VISTARIL) tablet 25 mg  25 mg Oral TID PRN Kathie Dike, MD   25 mg at 02/10/21 1727   mirtazapine (REMERON) tablet 7.5 mg  7.5 mg Oral QHS Zierle-Ghosh, Asia B, DO   7.5 mg at 02/13/21 2159   morphine 2 MG/ML injection 2 mg  2 mg Intravenous Q4H PRN Renee Pain, MD   2 mg at 02/06/21 1428   multivitamin with minerals tablet 1 tablet  1 tablet Oral Daily Pokhrel, Laxman, MD   1 tablet at 02/13/21 0958   polyethylene glycol (MIRALAX / GLYCOLAX) packet 17 g  17 g Oral Daily Kipp Brood, MD   17 g at 02/13/21 0958   senna-docusate (Senokot-S) tablet 1 tablet  1 tablet Oral Daily Kipp Brood, MD   1 tablet at 02/13/21 0958   sodium chloride (OCEAN) 0.65 % nasal spray 1 spray  1 spray Each Nare BID Chesley Mires, MD   1 spray at 02/13/21 2201   sodium chloride flush (NS) 0.9 % injection 10 mL  10 mL Other Q8H Agarwala, Einar Grad, MD    10 mL at 02/13/21 2201    REVIEW OF SYSTEMS:   Review of Systems - Oncology   PHYSICAL EXAMINATION: ECOG PERFORMANCE STATUS: {CHL ONC ECOG PS:[id number]}  There were no vitals filed for this visit. There were no vitals filed for this visit. Physical Exam   LABORATORY DATA:  I have reviewed the data as listed CBC Latest Ref Rng &  Units 02/13/2021 02/12/2021 02/09/2021  WBC 4.0 - 10.5 K/uL 18.8(H) 18.3(H) 14.8(H)  Hemoglobin 12.0 - 15.0 g/dL 7.8(L) 7.6(L) 8.1(L)  Hematocrit 36.0 - 46.0 % 26.0(L) 25.2(L) 27.1(L)  Platelets 150 - 400 K/uL 526(H) 517(H) 507(H)   CMP Latest Ref Rng & Units 02/12/2021 02/09/2021 02/09/2021  Glucose 70 - 99 mg/dL 133(H) 164(H) 178(H)  BUN 6 - 20 mg/dL 10 7 6   Creatinine 0.44 - 1.00 mg/dL 0.48 0.53 0.41(L)  Sodium 135 - 145 mmol/L 134(L) 136 135  Potassium 3.5 - 5.1 mmol/L 3.8 3.9 4.1  Chloride 98 - 111 mmol/L 98 97(L) 98  CO2 22 - 32 mmol/L 29 31 27   Calcium 8.9 - 10.3 mg/dL 8.4(L) 8.8(L) 8.4(L)  Total Protein 6.5 - 8.1 g/dL - - -  Total Bilirubin 0.3 - 1.2 mg/dL - - -  Alkaline Phos 38 - 126 U/L - - -  AST 15 - 41 U/L - - -  ALT 0 - 44 U/L - - -    RADIOGRAPHIC STUDIES: I have personally reviewed the radiological images as listed and agreed with the findings in the report. DG Chest 1 View  Result Date: 02/08/2021 CLINICAL DATA:  Empyema with chest tube in place EXAM: CHEST  1 VIEW COMPARISON:  Prior chest x-ray 02/07/2021 FINDINGS: Right basilar pigtail thoracostomy tube in unchanged position. Small amount of residual pleural thickening versus pleural fluid persists. Increasing patchy airspace opacities in the periphery of the right lung. Persistent patchy airspace opacities in the left upper and mid lung without significant interval change. No pneumothorax. No acute osseous abnormality. Hardware consistent with prior ORIF of left clavicle and left humerus fractures remains in place. IMPRESSION: 1. Increasing patchy airspace opacities in the  periphery of the right mid lung concerning for new atelectasis or pneumonia. 2. Stable and satisfactory position of right basilar pigtail thoracostomy tube. 3. Persistent advanced airspace opacities throughout the left mid and upper lung consistent with known multifocal pneumonia. Electronically Signed   By: Jacqulynn Cadet M.D.   On: 02/08/2021 08:14   CT CHEST WO CONTRAST  Result Date: 02/05/2021 CLINICAL DATA:  Empyema, pneumonia EXAM: CT CHEST WITHOUT CONTRAST TECHNIQUE: Multidetector CT imaging of the chest was performed following the standard protocol without IV contrast. COMPARISON:  02/02/2021 FINDINGS: Cardiovascular: Scattered coronary artery calcifications are seen. Evaluation is limited without intravenous contrast. Mediastinum/Nodes: No new significant lymphadenopathy seen. Evaluation is limited without intravenous contrast. Lungs/Pleura: There is interval decrease in loculated effusion in the right mid lung fields. There is interval removal of right chest tube. There are few tiny pockets of air in the anterolateral aspect of the pleural space in right mid lung fields. There are pockets of air in the previously removed right chest tube track in the chest wall. Large ground-glass and alveolar infiltrates are seen in the left lung. There is interval worsening of ground-glass infiltrate in left upper lobe. There is decrease in the infiltrates in left lower lobe. There are patchy ground-glass and infiltrates in right lung, with no significant interval change. There is interval decrease in amount of loculated effusion in the periphery of right upper lung fields. There is moderate to large right pleural effusion in the periphery of right lower lung fields. There are patchy infiltrates in right lower lobe. Upper Abdomen: Unremarkable Musculoskeletal: Unremarkable IMPRESSION: There is interval decrease in loculated effusion in the periphery of right lung. There is interval removal of right chest tube.  Moderate to large right pleural effusion is seen. Small left pleural  effusion is seen. There is interval worsening of ground-glass infiltrates in the left upper lobe. There is decrease in infiltrates in the left lower lobe. Infiltrates in the right lung have not changed significantly. There is 1.3 cm nodular density in the left lower lobe with spiculated margins. This may be part of resolving pneumonia or underlying parenchymal nodule such as neoplasm. Repeat CT after medical management of pneumonia should be considered. Electronically Signed   By: Elmer Picker M.D.   On: 02/05/2021 13:16   CT CHEST W CONTRAST  Result Date: 02/02/2021 CLINICAL DATA:  50 year old female with history of pleural effusion. EXAM: CT CHEST WITH CONTRAST TECHNIQUE: Multidetector CT imaging of the chest was performed during intravenous contrast administration. CONTRAST:  55m OMNIPAQUE IOHEXOL 300 MG/ML  SOLN COMPARISON:  Chest CT 01/30/2021. FINDINGS: Comment: Today's study is limited by considerable patient respiratory motion. Cardiovascular: Heart size is normal. There is no significant pericardial fluid, thickening or pericardial calcification. There is aortic atherosclerosis, as well as atherosclerosis of the great vessels of the mediastinum and the coronary arteries, including calcified atherosclerotic plaque in the left anterior descending coronary artery. Mediastinum/Nodes: No pathologically enlarged mediastinal or hilar lymph nodes. Esophagus is unremarkable in appearance. No axillary lymphadenopathy. Lungs/Pleura: Right-sided chest tube in position in the medial aspect of the mid right hemithorax. Loculated right pleural effusion with diffuse pleural thickening and enhancement, suggestive of empyema. Small amount of gas in the right pleural space could be iatrogenic related to indwelling chest tube, or could be related to infection with gas-forming organisms. Trace left pleural effusion predominantly lying dependently.  Multifocal airspace consolidation and patchy ground-glass attenuation noted in the lungs bilaterally (left greater than right), with substantially worsened aeration when compared to the recent prior study. Upper Abdomen: Unremarkable. Musculoskeletal: There are no aggressive appearing lytic or blastic lesions noted in the visualized portions of the skeleton. Orthopedic fixation hardware in the left clavicle. Old healed fracture and posttraumatic deformity of the left scapula. IMPRESSION: 1. Markedly worsened multilobar bilateral pneumonia, most severe in the left upper and lower lobes. 2. Interval placement of right-sided chest tube within what appears to be a large right empyema, as above. 3. Aortic atherosclerosis, in addition to left anterior descending coronary artery disease. Please note that although the presence of coronary artery calcium documents the presence of coronary artery disease, the severity of this disease and any potential stenosis cannot be assessed on this non-gated CT examination. Assessment for potential risk factor modification, dietary therapy or pharmacologic therapy may be warranted, if clinically indicated. Aortic Atherosclerosis (ICD10-I70.0). Electronically Signed   By: DVinnie LangtonM.D.   On: 02/02/2021 12:10   CT Chest W Contrast  Result Date: 01/30/2021 CLINICAL DATA:  Multiple pulmonary nodules suspicious for pulmonary metastatic disease. EXAM: CT CHEST WITH CONTRAST TECHNIQUE: Multidetector CT imaging of the chest was performed during intravenous contrast administration. CONTRAST:  661mOMNIPAQUE IOHEXOL 350 MG/ML SOLN COMPARISON:  CT abdomen pelvis 01/20/2021 FINDINGS: Cardiovascular: Extensive multi-vessel coronary artery calcification. Global cardiac size within normal limits. Small pericardial effusion is present, enlarged since prior examination. The central pulmonary arteries are of normal caliber. Mild atherosclerotic calcification within the thoracic aorta. No  aortic aneurysm. Mediastinum/Nodes: Large right pleural effusion results in new, mild mediastinal shift to the left. No pathologic thoracic adenopathy. The esophagus is unremarkable. Visualized thyroid is unremarkable. Lungs/Pleura: Since the prior examination, a large, partially loculated right pleural effusion has developed with near complete collapse of the right middle and lower lobes and compressive atelectasis  of the right upper lobe. There is superimposed patchy multifocal pulmonary ground-glass infiltrate and peripheral consolidation within the aerated lungs which appears progressive since prior examination within the visualized lung bases. Previously noted pulmonary nodules appear to have decreased in the interval, with the index lesion within the left lung base previously measuring 22 x 25 mm now measuring 14 mm x 20 mm. This suggests an evolving infectious or inflammatory process. Mild superimposed centrilobular emphysema. No pneumothorax. No central obstructing mass., Upper Abdomen: Moderate stool within the visualized splenic flexure. No acute abnormality. Musculoskeletal: No acute bone abnormality. No lytic or blastic bone lesions. Multiple healed bilateral rib fractures, bilateral clavicle fractures, and left scapular fracture are noted. IMPRESSION: Interval development of a large partially loculated right pleural effusion with near complete collapse of the right lung base in atelectasis of the right upper lobe and resultant mild mediastinal shift to the left. Evolving infectious or inflammatory process within the aerated lungs with interval decrease in size of focal pulmonary nodules noted on prior examination, but progressive peripheral consolidation and ground-glass pulmonary infiltrate demonstrating a basilar predominance. Differential considerations are led by acute infection though pulmonary vasculitides or drug reaction could appear similarly. Diagnostic and therapeutic thoracentesis of the  large right pleural effusion may be helpful for further management. Extensive multi-vessel coronary artery calcification. Mild emphysema Aortic Atherosclerosis (ICD10-I70.0) and Emphysema (ICD10-J43.9). Electronically Signed   By: Fidela Salisbury M.D.   On: 01/30/2021 04:16   CT Abdomen Pelvis W Contrast  Result Date: 01/21/2021 CLINICAL DATA:  Right lower quadrant mass. Chronic hepatitis B. EXAM: CT ABDOMEN AND PELVIS WITH CONTRAST TECHNIQUE: Multidetector CT imaging of the abdomen and pelvis was performed using the standard protocol following bolus administration of intravenous contrast. CONTRAST:  174m OMNIPAQUE IOHEXOL 300 MG/ML  SOLN COMPARISON:  None. FINDINGS: Lower Chest: Multiple pulmonary nodules are seen in both lung bases, measuring up to 3 cm. These are highly suspicious for pulmonary metastases. Tiny right pleural effusion also noted. Hepatobiliary: No hepatic masses identified. Increased attenuation in the gallbladder may be due to sludge or gallstones. No evidence of cholecystitis or biliary ductal dilatation. Pancreas:  No mass or inflammatory changes. Spleen: Within normal limits in size and appearance. Adrenals/Urinary Tract: No masses identified. No evidence of ureteral calculi or hydronephrosis. Stomach/Bowel: Large stool burden is noted throughout the colon. No masses identified. No evidence of obstruction, inflammatory process or abnormal fluid collections. Vascular/Lymphatic: No pathologically enlarged lymph nodes. No acute vascular findings. Reproductive: IUD noted. A cystic lesion is noted in the right adnexal region which measures 3.1 x 1.9 cm on image 79/2. This appears to show some internal areas of contrast enhancement, and cystic ovarian neoplasm cannot be excluded. No evidence of ascites. Other:  None. Musculoskeletal:  No suspicious bone lesions identified. IMPRESSION: Multiple pulmonary nodules in both lung bases, highly suspicious for pulmonary metastases. Consider chest CT  with contrast or PET-CT for further evaluation. 3.1 cm indeterminate cystic lesion in right adnexal region. Cystic ovarian neoplasm cannot be excluded. Pelvic MRI without and with contrast is recommended for further characterization. No evidence of abdominal or pelvic metastatic disease. Marked diffuse colonic distension by stool. Gallbladder sludge or gallstones. No radiographic evidence of cholecystitis or biliary ductal dilatation. These results will be called to the ordering clinician or representative by the Radiologist Assistant, and communication documented in the PACS or [employer] Electronically Signed   By: JMarlaine HindM.D.   On: 01/21/2021 16:04   DG CHEST PORT 1 VIEW  Result  Date: 02/13/2021 CLINICAL DATA:  Encounter for chest tube removal EXAM: PORTABLE CHEST 1 VIEW COMPARISON:  February 12, 2021 FINDINGS: No pneumothorax is identified. Patchy consolidation of the left upper lobe is slightly improved. There is minimal right pleural effusion. Nodularity of the right upper lobe is unchanged. Emphysematous changes of both lungs are identified. The mediastinal contour and cardiac silhouette are stable. Bony structures are stable. IMPRESSION: No pneumothorax. Patchy consolidation of the left upper lobe is slightly improved. Nodularity of the right upper lobe is unchanged. Electronically Signed   By: Abelardo Diesel M.D.   On: 02/13/2021 08:30   DG CHEST PORT 1 VIEW  Result Date: 02/12/2021 CLINICAL DATA:  Pleural effusion, difficulty breathing EXAM: PORTABLE CHEST 1 VIEW COMPARISON:  Previous studies including the examination of 02/11/2021 FINDINGS: Heart is within normal limits. Low position of diaphragms suggests COPD. There is interval removal of right chest tube. Small right pleural effusion is seen. There is blunting of left lateral CP angle. There is no pneumothorax. There are patchy infiltrates in the parahilar regions, more so on the left side. Increased markings are seen in both  lower lung fields with no significant interval change. IMPRESSION: There is interval removal of right chest tube. Small bilateral pleural effusions. There is no pneumothorax. COPD. There are patchy infiltrates in both lungs suggesting pneumonia. Part of this finding may suggest underlying scarring. Electronically Signed   By: Elmer Picker M.D.   On: 02/12/2021 10:06   DG Chest Port 1 View  Result Date: 02/12/2021 CLINICAL DATA:  The right pleural effusion with chest tube in place EXAM: PORTABLE CHEST 1 VIEW COMPARISON:  Prior chest x-ray 02/11/2021 FINDINGS: Pigtail thoracostomy tube remains in good position overlying the right lung base. Slight interval improvement in the appearance of the chest with decreasing patchy airspace opacities throughout the left mid lung and scattered throughout the right lung. Stable pleural thickening along the margin of the right lower lobe. Cardiac and mediastinal contours are unchanged. No acute osseous abnormality. Postsurgical changes of the clavicle again noted. IMPRESSION: 1. Slight interval improvement in aeration with decreasing patchy airspace opacities bilaterally. 2. Stable position of pigtail thoracostomy tube. 3. No new or acute abnormality identified. Electronically Signed   By: Jacqulynn Cadet M.D.   On: 02/12/2021 06:34   DG CHEST PORT 1 VIEW  Result Date: 02/11/2021 CLINICAL DATA:  Chest tube for empyema treatment EXAM: PORTABLE CHEST 1 VIEW COMPARISON:  02/09/2021 FINDINGS: Heart and mediastinal shadows are unchanged. Widespread patchy pulmonary opacities persist, most pronounced in the left midlung. Right pleural catheter remains in place. There is only a small amount of regional right pleural density, unchanged. IMPRESSION: No significant change. Widespread patchy pulmonary densities. Right pleural catheter in place with a small amount of pleural density, unchanged. Electronically Signed   By: Nelson Chimes M.D.   On: 02/11/2021 08:19   DG CHEST  PORT 1 VIEW  Result Date: 02/09/2021 CLINICAL DATA:  Pneumonia. Additional history provided: Pneumonia, chest tube. EXAM: PORTABLE CHEST 1 VIEW COMPARISON:  Prior chest radiographs 02/08/2021 and earlier. FINDINGS: Unchanged position of a right basilar chest tube. Unchanged small right pleural effusion. Persistent ill-defined opacity within the right mid lung, which may reflect atelectasis or pneumonia. Redemonstrated airspace opacity within the left upper to mid lung field, similar to slightly improved, and compatible with pneumonia. No sizable left pleural effusion. No evidence of pneumothorax. No acute bony abnormality identified. Prior ORIF of the left clavicle. IMPRESSION: Airspace disease within the left upper-to-mid lung  field, similar to slightly improved as compared to the prior chest radiograph of 02/08/2021. Findings are compatible with the provided history of pneumonia. Similar appearance of ill-defined opacity within the right mid-lung, which may reflect atelectasis or pneumonia. Stable position of a right basilar chest tube with unchanged small right pleural effusion. No evidence of pneumothorax. Electronically Signed   By: Kellie Simmering D.O.   On: 02/09/2021 10:37   DG Chest Port 1 View  Result Date: 02/07/2021 CLINICAL DATA:  Empyema EXAM: PORTABLE CHEST 1 VIEW COMPARISON:  Chest radiograph from one day prior. FINDINGS: Stable right basilar pigtail chest tube. Fixation hardware in the left clavicle. Stable cardiomediastinal silhouette with normal heart size. No pneumothorax. Stable basilar right pleural thickening/small effusion. Stable basilar left pleural thickening/small effusion. Patchy opacities throughout the left greater than right lungs, slightly improved. IMPRESSION: 1. No pneumothorax. Stable right basilar pigtail chest tube. 2. Stable bilateral basilar pleural thickening/small effusions. 3. Patchy opacities in the left greater than right lungs, slightly improved. Electronically Signed    By: Ilona Sorrel M.D.   On: 02/07/2021 07:16   DG Chest Port 1 View  Result Date: 02/06/2021 CLINICAL DATA:  50 year old female with pulmonary disease, history of tuberculosis EXAM: PORTABLE CHEST 1 VIEW COMPARISON:  Plain film 11/17/2014, CT chest 01/30/2021, plain film 02/01/2021-02/05/2021 FINDINGS: Cardiomediastinal silhouette likely unchanged in size and contour, with the heart borders partially obscured by overlying lung/pleural disease. Interval placement of pigtail drainage catheter/pleural catheter on the right with decreased opacity at the right lung base. Pleuroparenchymal thickening persists with partial obscuration of the right hemidiaphragm persisting. Minor fissure thickening persists. Multifocal mixed interstitial and airspace disease, left greater than right, persisting and essentially unchanged. No pneumothorax. Surgical changes of left clavicle again noted. IMPRESSION: Interval placement of pigtail drainage catheter in the inferior right pleural space, with decreased pleural fluid and no complicating features. Essentially unchanged multifocal left greater than right interstitial and airspace opacity. Electronically Signed   By: Corrie Mckusick D.O.   On: 02/06/2021 12:00   DG Chest Port 1 View  Result Date: 02/06/2021 CLINICAL DATA:  Short of breath abdominal pain EXAM: PORTABLE CHEST 1 VIEW COMPARISON:  02/04/2021 FINDINGS: Extensive airspace disease throughout the left upper lobe and left lower lobe appears similar. Right lower lobe airspace disease and right pleural effusion with mild progression. Chronic left rib fractures. ORIF left clavicle fracture which is chronic. IMPRESSION: Bilateral airspace disease left greater than right. No change on the left. Mild progression of right lower lobe infiltrate and right effusion. Electronically Signed   By: Franchot Gallo M.D.   On: 02/06/2021 11:22   DG CHEST PORT 1 VIEW  Result Date: 02/04/2021 CLINICAL DATA:  Follow-up chest tube.   Confusion. EXAM: PORTABLE CHEST 1 VIEW COMPARISON:  02/03/2021 FINDINGS: Right chest tube remains in place. No pneumothorax. Skin fold again demonstrated. Small amount of right pleural fluid persists. Persistent pneumonia more extensive in the left lung than the right, with slightly improved aeration. No worsening or new finding. IMPRESSION: No visible pneumothorax on the right in the frontal projection. Small amount of pleural fluid persists. Bilateral pulmonary infiltrates more extensive on the left than the right, but with slightly improved aeration since yesterday. Electronically Signed   By: Nelson Chimes M.D.   On: 02/04/2021 08:34   DG CHEST PORT 1 VIEW  Result Date: 02/03/2021 CLINICAL DATA:  Chest 2 Emphysema Chest and abdominal pain EXAM: PORTABLE CHEST 1 VIEW COMPARISON:  Of 04/14/2020 FINDINGS: Significant interval decrease of  right pleural effusion. Right chest tube is present. There is a linear at edge seen in the right superolateral lung which is favored to be a skin fold rather than pneumothorax given that lung markings are seen beyond this region. Dense airspace opacity in the left lung is similar to prior examination. Fixation plate seen in the left clavicle. IMPRESSION: 1. Significant interval decrease of right pleural effusion with chest tube in place. Linear edge is seen in the superolateral right upper lung is likely a skin fold given the lung markings are seen beyond this region. 2. Persistent dense airspace opacity of the left lung. Electronically Signed   By: Miachel Roux M.D.   On: 02/03/2021 08:21   DG CHEST PORT 1 VIEW  Result Date: 02/02/2021 CLINICAL DATA:  Chest tube evaluation EXAM: PORTABLE CHEST 1 VIEW COMPARISON:  Chest x-ray earlier the same day FINDINGS: Interval placement of a right-sided chest tube with the tip in the medial midlung zone. Mildly decreased size of a large loculated right-sided pleural effusion, with associated atelectasis/infiltrate. Extensive airspace  opacities again seen throughout the left lung mostly in the mid lung zone, unchanged. No pneumothorax visualized. IMPRESSION: 1. Right-sided chest tube in place. Mildly decreased size of a right-sided loculated pleural effusion with associated atelectasis/infiltrate. 2. No significant change in the left lung. Electronically Signed   By: Ofilia Neas M.D.   On: 02/02/2021 08:24   DG Chest Port 1 View  Result Date: 02/02/2021 CLINICAL DATA:  Hypoxia, shortness of breath EXAM: PORTABLE CHEST 1 VIEW COMPARISON:  02/01/2021 FINDINGS: Large loculated right pleural effusion. Multifocal patchy opacities in the left mid lung and right lower lobe, suggesting multifocal pneumonia, similar. No pneumothorax. The heart is normal in size. Status post ORIF of the left clavicle. IMPRESSION: Multifocal pneumonia, similar. Large loculated right pleural effusion, unchanged. Electronically Signed   By: Julian Hy M.D.   On: 02/02/2021 03:41   DG CHEST PORT 1 VIEW  Result Date: 02/01/2021 CLINICAL DATA:  Dyspnea. EXAM: PORTABLE CHEST 1 VIEW COMPARISON:  CT 01/30/2021 FINDINGS: Similar opacification of right hemithorax corresponding to large loculated pleural effusion and basilar airspace disease. Progress of patchy opacities throughout the left hemithorax. There may be a small left pleural effusion, new. Left basilar pulmonary nodules are not well seen by radiograph. The heart size is normal, but obscured by adjacent pulmonary opacities. Remote traumatic injury to the left hemithorax. IMPRESSION: 1. Similar opacification of the right hemithorax corresponding to large loculated pleural effusion and basilar airspace disease on CT. 2. Progression of patchy opacities throughout the left hemithorax,, which may be multifocal infection or pulmonary edema. Possible small left pleural effusion, new. Electronically Signed   By: Keith Rake M.D.   On: 02/01/2021 21:23   DG ESOPHAGUS W SINGLE CM (SOL OR THIN BA)  Result  Date: 02/08/2021 CLINICAL DATA:  Difficulty swallowing food. EXAM: ESOPHAGUS/BARIUM SWALLOW/TABLET STUDY TECHNIQUE: Single contrast examination was performed using thin liquid barium. This exam was performed by Rushie Nyhan, and was supervised and interpreted by Lorin Picket, MD. FLUOROSCOPY TIME:  Radiation Exposure Index (as provided by the fluoroscopic device): If the device does not provide the exposure index: Fluoroscopy Time:  2 minutes 12 seconds. Number of Acquired Images:  None. COMPARISON:  NONE. FINDINGS: Swallowing: Appears normal. No vestibular penetration or aspiration seen. Pharynx: Unremarkable. Esophagus: There may be mild esophageal fold thickening. Esophageal motility: Within normal limits. Hiatal Hernia: None. Gastroesophageal reflux: None. Ingested 55m barium tablet: A 13 mm barium tablet would not  pass beyond the gastroesophageal junction, suggesting very minimal narrowing. No high-grade stricture. Other: None. IMPRESSION: 1. A 13 mm barium tablet would not pass beyond the gastroesophageal junction, suggesting very minimal narrowing. No high-grade stricture. 2. Probable mild esophageal fold thickening. Electronically Signed   By: Lorin Picket M.D.   On: 02/08/2021 10:24    ASSESSMENT:  ***   PLAN:  ***   All questions were answered. The patient knows to call the clinic with any problems, questions or concerns.   Derek Jack, MD, 02/14/21 8:00 AM  [place] [phone]   I, ***, am acting as a scribe for Dr. Derek Jack.  {Add Barista Statement}

## 2021-02-15 ENCOUNTER — Inpatient Hospital Stay (HOSPITAL_COMMUNITY): Payer: Medicare Other | Admitting: Hematology

## 2021-02-15 DIAGNOSIS — N83299 Other ovarian cyst, unspecified side: Secondary | ICD-10-CM | POA: Diagnosis not present

## 2021-02-15 DIAGNOSIS — J690 Pneumonitis due to inhalation of food and vomit: Secondary | ICD-10-CM | POA: Diagnosis not present

## 2021-02-15 DIAGNOSIS — B181 Chronic viral hepatitis B without delta-agent: Secondary | ICD-10-CM | POA: Diagnosis not present

## 2021-02-15 DIAGNOSIS — J9601 Acute respiratory failure with hypoxia: Secondary | ICD-10-CM | POA: Diagnosis not present

## 2021-02-15 LAB — CBC WITH DIFFERENTIAL/PLATELET
Abs Immature Granulocytes: 0.09 10*3/uL — ABNORMAL HIGH (ref 0.00–0.07)
Basophils Absolute: 0.1 10*3/uL (ref 0.0–0.1)
Basophils Relative: 1 %
Eosinophils Absolute: 0.2 10*3/uL (ref 0.0–0.5)
Eosinophils Relative: 1 %
HCT: 27.3 % — ABNORMAL LOW (ref 36.0–46.0)
Hemoglobin: 8.4 g/dL — ABNORMAL LOW (ref 12.0–15.0)
Immature Granulocytes: 1 %
Lymphocytes Relative: 26 %
Lymphs Abs: 3.7 10*3/uL (ref 0.7–4.0)
MCH: 26.9 pg (ref 26.0–34.0)
MCHC: 30.8 g/dL (ref 30.0–36.0)
MCV: 87.5 fL (ref 80.0–100.0)
Monocytes Absolute: 1.1 10*3/uL — ABNORMAL HIGH (ref 0.1–1.0)
Monocytes Relative: 8 %
Neutro Abs: 9.2 10*3/uL — ABNORMAL HIGH (ref 1.7–7.7)
Neutrophils Relative %: 63 %
Platelets: 543 10*3/uL — ABNORMAL HIGH (ref 150–400)
RBC: 3.12 MIL/uL — ABNORMAL LOW (ref 3.87–5.11)
RDW: 15.7 % — ABNORMAL HIGH (ref 11.5–15.5)
WBC: 14.3 10*3/uL — ABNORMAL HIGH (ref 4.0–10.5)
nRBC: 0 % (ref 0.0–0.2)

## 2021-02-15 LAB — SARS CORONAVIRUS 2 (TAT 6-24 HRS): SARS Coronavirus 2: NEGATIVE

## 2021-02-15 NOTE — Progress Notes (Signed)
Mobility Specialist Progress Note   02/15/21 1100  Mobility  Activity Ambulated in hall  Level of Assistance Standby assist, set-up cues, supervision of patient - no hands on  Assistive Device None  Distance Ambulated (ft) 250 ft  Mobility Ambulated independently in hallway  Mobility Response Tolerated well  Mobility performed by Mobility specialist  $Mobility charge 1 Mobility   Received pt in bed w/ no presenting symptoms. Pt's mobility is very independent but needs heavy VC for instruction in order to stay focus on task. During ambulation, pt had a slight posterior lean w/ exaggerated steps otherwise pt ambulates well. Returned to bed w/ call bell by side and all needs met.   Holland Falling Mobility Specialist Phone Number [phone]

## 2021-02-15 NOTE — TOC Progression Note (Addendum)
Transition of Care [facility]) - Progression Note    Patient Details  Name: Margaret [name] MRN: [id number] Date of Birth: 03/19/1971  Transition of Care [facility]) CM/SW Contact  Jacalyn Lefevre Edson Snowball, RN Phone Number: 02/15/2021, 3:08 PM  Clinical Narrative:     [facility] can accept for home health PT and home health RN   No PT rec no PT follow up. MD does not feel that she needs HHRN. Home health cancelled.  No DME recommendations now, cancelled walker and 3 in 1   Expected Discharge Plan: Group Home Barriers to Discharge: Continued Medical Work up  Expected Discharge Plan and Services Expected Discharge Plan: [place] arrangements for the past 2 months: Group Home Expected Discharge Date: 02/15/21                                     Social Determinants of Health (SDOH) Interventions    Readmission Risk Interventions No flowsheet data found.

## 2021-02-15 NOTE — TOC Progression Note (Addendum)
Transition of Care [facility]) - Progression Note    Patient Details  Name: Margaret [name] MRN: [id number] Date of Birth: 01-25-1971  Transition of Care [facility] [place]) CM/SW Contact  Emeterio Reeve, [place] Phone Number: 02/15/2021, 11:57 AM  Clinical Narrative:     CSW spoke to Ms Margaret [name] (ph: [phone]) at [facility] group home. CSW informed Ms Margaret [name] that pt is ready for DC. Ms Margaret [name] stated that they will first have to come do an assessment to see if they are able to meet her needs. Ms Margaret [name] stated that PTA pt was independent with mobility and ADL's and needs to be mostly independent with DC.   Ms Margaret [name] stated HHPT will need to be set up by the hospital and they do not have a preferences. CSW faxed PT/OT/ Progress note to [phone].   CSW also spoke to pts legal guardian at [facility]. They request that DC summary is faxed to them at [phone] prior to discharge.   3:00pm- CSW spoke to Ms Margaret [name], they will evaluate pt 11/16 at 8:30am. If it is determined yes pt is appropriate to return back she will need [employer] transportation/ taxi home.   Expected Discharge Plan: Group Home Barriers to Discharge: Continued Medical Work up  Expected Discharge Plan and Services Expected Discharge Plan: [place] arrangements for the past 2 months: Group Home Expected Discharge Date: 02/15/21                                     Social Determinants of Health (SDOH) Interventions    Readmission Risk Interventions No flowsheet data found.  Emeterio Reeve, LCSW Clinical Social Worker

## 2021-02-15 NOTE — Progress Notes (Signed)
Physical Therapy Treatment Patient Details Name: Margaret [name] MRN: [id number] DOB: [birth year] Today's Date: 02/15/2021   History of Present Illness 50 year old F admitted 10/28 initially admitted at [facility] for community-acquired pneumonia.  CT showed large loculated right pleural effusion.  She was transferred to [facility] 10/30. She had fibrinolytics on 11/1 and 11/2.  Chest tube removed on 11/4.  However, she had enlarging residual posterior effusion for which she required pigtail catheter chest tube from 11/5-11/11. PMH of intellectual disability, childhood TB, schizophrenia, hepatitis B, ovarian cancer with elevated CA 125    PT Comments    Patient received in bed, she is agreeable to PT session. Patient requires cues for mobility. Independent with bed mobility and transfers. Reached out for my hand for ambulation, went 250 feet reports fatigue. She is ambulating independently in room. Observed her get up and go into and out of bathroom independently. Patient appears to be at or near baseline level of function. She will continue to benefit from skilled PT while here to maximize safety with mobility.      Recommendations for follow up therapy are one component of a multi-disciplinary discharge planning process, led by the attending physician.  Recommendations may be updated based on patient status, additional functional criteria and insurance authorization.  Follow Up Recommendations  No PT follow up     Assistance Recommended at Discharge Set up Supervision/Assistance  Equipment Recommendations  None recommended by PT    Recommendations for Other Services       Precautions / Restrictions Restrictions Weight Bearing Restrictions: No     Mobility  Bed Mobility Overal bed mobility: Independent                  Transfers Overall transfer level: Independent Equipment used: None Transfers: Sit to/from Stand Sit to Stand: Independent                 Ambulation/Gait Ambulation/Gait assistance: Min guard Gait Distance (Feet): 250 Feet Assistive device: 1 person hand held assist Gait Pattern/deviations: Step-through pattern Gait velocity: normal     General Gait Details: Patient ambulated with hand held assist 250 feet. No lob. Ambulated into and out of bathroom independently after session.   Stairs             Wheelchair Mobility    Modified Rankin (Stroke Patients Only)       Balance Overall balance assessment: Mild deficits observed, not formally tested Sitting-balance support: Feet supported Sitting balance-Leahy Scale: Normal     Standing balance support: Single extremity supported;During functional activity Standing balance-Leahy Scale: Good                              Cognition Arousal/Alertness: Awake/alert Behavior During Therapy: WFL for tasks assessed/performed Overall Cognitive Status: History of cognitive impairments - at baseline                                          Exercises      General Comments        Pertinent Vitals/Pain Pain Assessment: No/denies pain    Home Living                          Prior Function            PT Goals (  current goals can now be found in the care plan section) Acute Rehab PT Goals Patient Stated Goal: to return to group home PT Goal Formulation: With patient Time For Goal Achievement: 02/26/21 Potential to Achieve Goals: Good Progress towards PT goals: Progressing toward goals    Frequency    Min 3X/week      PT Plan Discharge plan needs to be updated    Co-evaluation              AM-PAC PT "6 Clicks" Mobility   Outcome Measure  Help needed turning from your back to your side while in a flat bed without using bedrails?: None Help needed moving from lying on your back to sitting on the side of a flat bed without using bedrails?: None Help needed moving to and from a bed to a chair  (including a wheelchair)?: None Help needed standing up from a chair using your arms (e.g., wheelchair or bedside chair)?: None Help needed to walk in hospital room?: None Help needed climbing 3-5 steps with a railing? : A Little 6 Click Score: 23    End of Session   Activity Tolerance: Patient tolerated treatment well Patient left: in bed Nurse Communication: Mobility status PT Visit Diagnosis: Muscle weakness (generalized) (M62.81)     Time: 1348-1400 PT Time Calculation (min) (ACUTE ONLY): 12 min  Charges:  $Gait Training: 8-22 mins                     [employer], PT, G[facility] 02/15/21,2:09 PM

## 2021-02-15 NOTE — Progress Notes (Signed)
PROGRESS NOTE  Margaret [name] ZJI:[id number] DOB: [birth year]   PCP: Dettinger, Fransisca Kaufmann, MD  Patient is from: Group home  DOA: 01/29/2021 LOS: 5  Chief complaints:  Chief Complaint  Patient presents with   Abdominal Pain     Brief Narrative / Interim history: 50 year old F with PMH of intellectual disability, childhood TB, schizophrenia, hepatitis B, ovarian cancer with elevated CA 125 presenting with progressive cough.  Initially admitted at [facility] for community-acquired pneumonia.  CT showed large loculated right pleural effusion.  She was transferred to [facility]. She underwent right chest tube placement on 11/12 by PCCM.  She had fibrinolytics on 11/1 and 11/2.  Chest tube removed on 11/4.  However, she had enlarging residual posterior effusion for which she required pigtail catheter from 11/5-11/11.  She also had additional fibrinolytics.  Remains on p.o. Augmentin for possible aspiration pneumonia with right empyema. Currently on room air.  PCCM recommended p.o. antibiotics for a total of 21 days which ends on 02/26/2021.  She also have a right adrenal mass and cystic ovarian lesion with elevated CEA 125 for which outpatient follow-up are recommended.  Patient is medically stable for discharge.  Group home to assess patient to see if they can take her back  Subjective: Seen and examined earlier this morning.  No major events overnight of this morning.  No complaints but not a great historian due to intellectual disability although she is oriented x4.  Objective: Vitals:   02/15/21 0032 02/15/21 0532 02/15/21 0718 02/15/21 1220  BP: 97/63 1_0  Pulse: (!) 104 93 91 99  Resp: _1 Temp: 97.9 F (36.6 C) 97.9 F (36.6 C) 97.9 F (36.6 C) 98.3 F (36.8 C)  TempSrc: Oral Oral Oral Oral  SpO2: 99% 97% 100% 99%  Weight:      Height:        Intake/Output Summary (Last 24 hours) at 02/15/2021 1236 Last data filed at 02/14/2021 1330 Gross per 24  hour  Intake 300 ml  Output --  Net 300 ml   Filed Weights   02/06/21 0500 02/07/21 0500 02/08/21 0600  Weight: 51.9 kg 48.8 kg 48 kg    Examination:  GENERAL: No apparent distress.  Nontoxic. HEENT: MMM.  Vision and hearing grossly intact.  NECK: Supple.  No apparent JVD.  RESP: 99% on RA.  No IWOB.  Fair aeration bilaterally. CVS:  RRR. Heart sounds normal.  ABD/GI/GU: BS+. Abd soft, NTND.  MSK/EXT:  Moves extremities. No apparent deformity.  Significant muscle mass and subcu fat loss. SKIN: no apparent skin lesion or wound NEURO: Awake and alert. Oriented appropriately.  No apparent focal neuro deficit. PSYCH: Calm. Normal affect.   Procedures:  11/1-placement of right chest tube 11/5-11/11- posterior pigtail catheter  Microbiology summarized: 10/29-COVID-19 and influenza PCR nonreactive. 10/29-blood cultures negative. 10/31-full RVP and MRSA PCR screen negative. 11/1-blood cultures negative.  Assessment & Plan: Recurrent aspiration pneumonia with right empyema -S/p chest tube and pigtail as above.  Follow-up cultures NGTD. -Repeat CXR on 11/11 with patchy infiltrates bilaterally, small bilateral pleural effusions and no PTX. -Leukocytosis improved. -CTX and Zithromax>>> p.o. Augmentin for a total of 3 weeks which ends on 02/26/2021 per PCCM -Evaluated by SLP and deemed to have mild aspiration risk.  SLP recommended regular clear liquid diet.    Right adrenal mass Cystic right ovarian lesion with elevated CEA 125 -Outpatient follow-up.  Schizophrenia: Somewhat stable.  -Continue chlorpromazine, benztropine and Prozac.   Hypokalemia: Resolved.  Normocytic anemia: H&H relatively stable. Recent Labs    02/04/21 0253 02/05/21 0552 02/06/21 0653 02/07/21 0127 02/08/21 0012 02/09/21 0152 02/09/21 1109 02/12/21 0102 02/13/21 0140 02/15/21 0114  HGB 7.6* 7.6* 7.3* 7.2* 8.1* 7.8* 8.1* 7.6* 7.8* 8.4*  -Continue monitoring -Transfuse for Hgb less than  7.0.  History of hepatitis B infection -Continue home Entecavir.  Intellectual disability: From group home.  Seems to be fairly oriented but does not seem to have great insight.  Not fully cooperative.  Leukocytosis/bandemia/thrombocytosis: Improved. -Continue monitoring intermittently  Physical deconditioning -PT/OT if she cooperates.  Severe malnutrition: POA.  As evidenced by low BMI, significant muscle mass and subcu fat loss. Body mass index is 18.16 kg/m. Nutrition Problem: Severe Malnutrition Etiology: chronic illness (met Ovarian cancer) Signs/Symptoms: severe muscle depletion, severe fat depletion, percent weight loss Percent weight loss: 15.3 % Interventions: Ensure Enlive (each supplement provides 350kcal and 20 grams of protein), MVI   DVT prophylaxis:  heparin injection 5,000 Units Start: 01/30/21 1400 SCDs Start: 01/30/21 1003  Code Status: Full code Family Communication: Patient and/or RN. Available if any question.  Level of care: Med-Surg Status is: Inpatient  Remains inpatient appropriate because: Safe disposition   Final disposition: Back to group home when they can take her back.  Medically stable for discharge    Consultants:  Pulmonology-signed off   Sch Meds:  Scheduled Meds:  amoxicillin-clavulanate  1 tablet Oral Q12H   benztropine  1 mg Oral BID   Chlorhexidine Gluconate Cloth  6 each Topical Daily   chlorproMAZINE  50 mg Oral BID   cloZAPine  250 mg Oral QHS   COVID-19 mRNA bivalent vaccine (Moderna)  0.5 mL Intramuscular ONCE-1600   entecavir  0.5 mg Oral Daily   feeding supplement  237 mL Oral TID BM   FLUoxetine  20 mg Oral Daily   fluticasone  1 spray Each Nare Daily   heparin  5,000 Units Subcutaneous Q8H   mirtazapine  7.5 mg Oral QHS   multivitamin with minerals  1 tablet Oral Daily   polyethylene glycol  17 g Oral Daily   senna-docusate  1 tablet Oral Daily   sodium chloride  1 spray Each Nare BID   sodium chloride flush   10 mL Other Q8H   Continuous Infusions: PRN Meds:.acetaminophen, albuterol, fentaNYL (SUBLIMAZE) injection, hydrOXYzine, morphine injection  Antimicrobials: Anti-infectives (From admission, onward)    Start     Dose/Rate Route Frequency Ordered Stop   02/05/21 1000  amoxicillin-clavulanate (AUGMENTIN) 875-125 MG per tablet 1 tablet        1 tablet Oral Every 12 hours 02/05/21 0818 02/26/21 0959   02/03/21 1700  entecavir (BARACLUDE) tablet 0.5 mg        0.5 mg Oral Daily 02/03/21 1321     02/02/21 1030  cefTRIAXone (ROCEPHIN) 2 g in sodium chloride 0.9 % 100 mL IVPB  Status:  Discontinued        2 g 200 mL/hr over 30 Minutes Intravenous Every 24 hours 02/02/21 0941 02/05/21 0818   02/01/21 1100  Ampicillin-Sulbactam (UNASYN) 3 g in sodium chloride 0.9 % 100 mL IVPB  Status:  Discontinued        3 g 200 mL/hr over 30 Minutes Intravenous Every 6 hours 02/01/21 1007 02/02/21 0941   01/31/21 0500  cefTRIAXone (ROCEPHIN) 2 g in sodium chloride 0.9 % 100 mL IVPB  Status:  Discontinued        2 g 200 mL/hr over 30 Minutes Intravenous Every 24  hours 01/30/21 1004 02/01/21 1007   01/30/21 1015  entecavir (BARACLUDE) tablet 0.5 mg  Status:  Discontinued       Note to Pharmacy: TAKE (1) TABLET BY MOUTH ONCE DAILY.     0.5 mg Oral Daily 01/30/21 1004 02/02/21 0925   01/30/21 0515  cefTRIAXone (ROCEPHIN) 2 g in sodium chloride 0.9 % 100 mL IVPB        2 g 200 mL/hr over 30 Minutes Intravenous  Once 01/30/21 0509 01/30/21 0615   01/30/21 0515  azithromycin (ZITHROMAX) 500 mg in sodium chloride 0.9 % 250 mL IVPB  Status:  Discontinued        500 mg 250 mL/hr over 60 Minutes Intravenous Every 24 hours 01/30/21 0509 02/02/21 1357   01/30/21 0315  doxycycline (VIBRA-TABS) tablet 100 mg        100 mg Oral  Once 01/30/21 0314 01/30/21 0328        I have personally reviewed the following labs and images: CBC: Recent Labs  Lab 02/09/21 0152 02/09/21 1109 02/12/21 0102 02/13/21 0140  02/15/21 0114  WBC 17.1* 14.8* 18.3* 18.8* 14.3*  NEUTROABS  --   --   --   --  9.2*  HGB 7.8* 8.1* 7.6* 7.8* 8.4*  HCT 25.9* 27.1* 25.2* 26.0* 27.3*  MCV 86.9 87.1 88.1 87.5 87.5  PLT 465* 507* 517* 526* 543*   BMP &GFR Recent Labs  Lab 02/09/21 0152 02/09/21 1109 02/12/21 0102  NA 135 136 134*  K 4.1 3.9 3.8  CL 98 97* 98  CO2 _0 GLUCOSE 178* 164* 133*  BUN _1 CREATININE 0.41* 0.53 0.48  CALCIUM 8.4* 8.8* 8.4*  MG 1.8 2.0 2.1  PHOS  --   --  3.6   Estimated Creatinine Clearance: 65.2 mL/min (by C-G formula based on SCr of 0.48 mg/dL). Liver & Pancreas: Recent Labs  Lab 02/12/21 0102  ALBUMIN 1.9*   No results for input(s): LIPASE, AMYLASE in the last 168 hours. No results for input(s): AMMONIA in the last 168 hours. Diabetic: No results for input(s): HGBA1C in the last 72 hours. No results for input(s): GLUCAP in the last 168 hours. Cardiac Enzymes: No results for input(s): CKTOTAL, CKMB, CKMBINDEX, TROPONINI in the last 168 hours. No results for input(s): PROBNP in the last 8760 hours. Coagulation Profile: No results for input(s): INR, PROTIME in the last 168 hours. Thyroid Function Tests: No results for input(s): TSH, T4TOTAL, FREET4, T3FREE, THYROIDAB in the last 72 hours. Lipid Profile: No results for input(s): CHOL, HDL, LDLCALC, TRIG, CHOLHDL, LDLDIRECT in the last 72 hours. Anemia Panel: No results for input(s): VITAMINB12, FOLATE, FERRITIN, TIBC, IRON, RETICCTPCT in the last 72 hours.  Urine analysis:    Component Value Date/Time   COLORURINE YELLOW 01/30/2021 0504   APPEARANCEUR CLEAR 01/30/2021 0504   APPEARANCEUR Clear 06/30/2016 1013   LABSPEC 1.043 (H) 01/30/2021 0504   PHURINE 6.0 01/30/2021 0504   GLUCOSEU NEGATIVE 01/30/2021 0504   HGBUR NEGATIVE 01/30/2021 0504   BILIRUBINUR NEGATIVE 01/30/2021 0504   BILIRUBINUR Negative 06/30/2016 [place] 01/30/2021 0504   PROTEINUR NEGATIVE 01/30/2021 0504    UROBILINOGEN 0.2 04/13/2011 0330   NITRITE POSITIVE (A) 01/30/2021 0504   LEUKOCYTESUR NEGATIVE 01/30/2021 0504   Sepsis Labs: Invalid input(s): PROCALCITONIN, [place]  Microbiology: Recent Results (from the past 240 hour(s))  SARS CORONAVIRUS 2 (TAT 6-24 HRS) Nasopharyngeal Nasopharyngeal Swab     Status: None   Collection Time: 02/14/21  7:40 PM   Specimen: Nasopharyngeal Swab  Result Value Ref Range Status   SARS Coronavirus 2 NEGATIVE NEGATIVE Final    Comment: (NOTE) SARS-CoV-2 target nucleic acids are NOT DETECTED.  The SARS-CoV-2 RNA is generally detectable in upper and lower respiratory specimens during the acute phase of infection. Negative results do not preclude SARS-CoV-2 infection, do not rule out co-infections with other pathogens, and should not be used as the sole basis for treatment or other patient management decisions. Negative results must be combined with clinical observations, patient history, and epidemiological information. The expected result is Negative.  Fact Sheet for Patients: SugarRoll.be  Fact Sheet for Healthcare Providers: https://www.woods-mathews.com/  This test is not yet approved or cleared by the [place] FDA and  has been authorized for detection and/or diagnosis of SARS-CoV-2 by FDA under an Emergency Use Authorization (EUA). This EUA will remain  in effect (meaning this test can be used) for the duration of the COVID-19 declaration under Se ction 564(b)(1) of the Act, 21 U.S.C. section 360bbb-3(b)(1), unless the authorization is terminated or revoked sooner.  Performed at [facility], [place] [address] [place], [place] [address]      Radiology Studies: No results found.    Damarious Holtsclaw T. [place]  If 7PM-7AM, please contact night-coverage [email or web address] 02/15/2021, 12:36 PM

## 2021-02-16 DIAGNOSIS — J690 Pneumonitis due to inhalation of food and vomit: Secondary | ICD-10-CM | POA: Diagnosis not present

## 2021-02-16 DIAGNOSIS — N83299 Other ovarian cyst, unspecified side: Secondary | ICD-10-CM | POA: Diagnosis not present

## 2021-02-16 DIAGNOSIS — J9601 Acute respiratory failure with hypoxia: Secondary | ICD-10-CM | POA: Diagnosis not present

## 2021-02-16 DIAGNOSIS — B181 Chronic viral hepatitis B without delta-agent: Secondary | ICD-10-CM | POA: Diagnosis not present

## 2021-02-16 NOTE — Progress Notes (Signed)
PROGRESS NOTE  Margaret [name] UKG:[id number] DOB: February 11, 1971   PCP: Dettinger, Fransisca Kaufmann, MD  Patient is from: Group home  DOA: 01/29/2021 LOS: 75  Chief complaints:  Chief Complaint  Patient presents with   Abdominal Pain     Brief Narrative / Interim history: 50 year old F with PMH of intellectual disability, childhood TB, schizophrenia, hepatitis B, ovarian cancer with elevated CA 125 presenting with progressive cough.  Initially admitted at [facility] for community-acquired pneumonia.  CT showed large loculated right pleural effusion.  She was transferred to [facility]. She underwent right chest tube placement on 11/12 by PCCM.  She had fibrinolytics on 11/1 and 11/2.  Chest tube removed on 11/4.  However, she had enlarging residual posterior effusion for which she required pigtail catheter from 11/5-11/11.  She also had additional fibrinolytics.  Remains on p.o. Augmentin for possible aspiration pneumonia with right empyema. Currently on room air.  PCCM recommended p.o. antibiotics for a total of 21 days which ends on 02/26/2021.  She also have a right adrenal mass and cystic ovarian lesion with elevated CEA 125 for which outpatient follow-up are recommended.  Patient is medically stable for discharge.  Group home to assess patient to see if they can take her back  Subjective: Seen and examined earlier this morning.  No major events overnight of this morning.  She was sleepy but wakes to voice easily.  No complaints.  Objective: Vitals:   02/15/21 2011 02/16/21 0037 02/16/21 0848 02/16/21 1152  BP: (!) 90/52 90/64 (!) 88/48 98/61  Pulse: (!) 103 99 99 85  Resp: _0 Temp: 98.3 F (36.8 C) 98.8 F (37.1 C) 98 F (36.7 C) (!) 97.4 F (36.3 C)  TempSrc: Oral Oral Oral   SpO2: 99% 100% 97% (!) 85%  Weight:      Height:       No intake or output data in the 24 hours ending 02/16/21 1453  Filed Weights   02/06/21 0500 02/07/21 0500 02/08/21 0600  Weight: 51.9 kg  48.8 kg 48 kg    Examination:  GENERAL: No apparent distress.  Nontoxic. HEENT: MMM.  Vision and hearing grossly intact.  NECK: Supple.  No apparent JVD.  RESP:  No IWOB.  Fair aeration bilaterally. CVS:  RRR. Heart sounds normal.  ABD/GI/GU: BS+. Abd soft, NTND.  MSK/EXT:  Moves extremities.  Significant muscle mass and subcu fat loss. SKIN: no apparent skin lesion or wound NEURO: Awake and alert. Oriented appropriately.  No apparent focal neuro deficit. PSYCH: Calm. Normal affect.   Procedures:  11/1-placement of right chest tube 11/5-11/11- posterior pigtail catheter  Microbiology summarized: 10/29-COVID-19 and influenza PCR nonreactive. 10/29-blood cultures negative. 10/31-full RVP and MRSA PCR screen negative. 11/1-blood cultures negative.  Assessment & Plan: Recurrent aspiration pneumonia with right empyema -S/p chest tube and pigtail as above.  Follow-up cultures NGTD. -Repeat CXR on 11/11 with patchy infiltrates bilaterally, small bilateral pleural effusions and no PTX. -Leukocytosis improved. -CTX and Zithromax>>> p.o. Augmentin for a total of 3 weeks which ends on 02/26/2021 per PCCM -SLP recommended regular clear liquid diet.    Right adrenal mass Cystic right ovarian lesion with elevated CEA 125 -Outpatient follow-up.  Schizophrenia: Somewhat stable.  -Continue chlorpromazine, benztropine and Prozac.   Hypokalemia: Resolved.  Normocytic anemia: H&H relatively stable. Recent Labs    02/04/21 0253 02/05/21 0552 02/06/21 0653 02/07/21 0127 02/08/21 0012 02/09/21 0152 02/09/21 1109 02/12/21 0102 02/13/21 0140 02/15/21 0114  HGB 7.6* 7.6* 7.3* 7.2*  8.1* 7.8* 8.1* 7.6* 7.8* 8.4*  -Continue monitoring -Transfuse for Hgb less than 7.0.  History of hepatitis B infection -Continue home Entecavir.  Intellectual disability: From group home.  Seems to be fairly oriented but does not seem to have great insight.  Not fully  cooperative.  Leukocytosis/bandemia/thrombocytosis: Improved. -Continue monitoring intermittently  Physical deconditioning -PT/OT if she cooperates.  Severe malnutrition: POA.  As evidenced by low BMI, significant muscle mass and subcu fat loss. Body mass index is 18.16 kg/m. Nutrition Problem: Severe Malnutrition Etiology: chronic illness (met Ovarian cancer) Signs/Symptoms: severe muscle depletion, severe fat depletion, percent weight loss Percent weight loss: 15.3 % Interventions: Ensure Enlive (each supplement provides 350kcal and 20 grams of protein), MVI   DVT prophylaxis:  heparin injection 5,000 Units Start: 01/30/21 1400 SCDs Start: 01/30/21 1003  Code Status: Full code Family Communication: Patient and/or RN. Available if any question.  Level of care: Med-Surg Status is: Inpatient  Remains inpatient appropriate because: Safe disposition   Final disposition: Back to group home when they can take her back.  Medically stable for discharge    Consultants:  Pulmonology-signed off   Sch Meds:  Scheduled Meds:  amoxicillin-clavulanate  1 tablet Oral Q12H   benztropine  1 mg Oral BID   Chlorhexidine Gluconate Cloth  6 each Topical Daily   chlorproMAZINE  50 mg Oral BID   cloZAPine  250 mg Oral QHS   entecavir  0.5 mg Oral Daily   feeding supplement  237 mL Oral TID BM   FLUoxetine  20 mg Oral Daily   fluticasone  1 spray Each Nare Daily   heparin  5,000 Units Subcutaneous Q8H   mirtazapine  7.5 mg Oral QHS   multivitamin with minerals  1 tablet Oral Daily   polyethylene glycol  17 g Oral Daily   senna-docusate  1 tablet Oral Daily   sodium chloride  1 spray Each Nare BID   sodium chloride flush  10 mL Other Q8H   Continuous Infusions: PRN Meds:.acetaminophen, albuterol, fentaNYL (SUBLIMAZE) injection, hydrOXYzine, morphine injection  Antimicrobials: Anti-infectives (From admission, onward)    Start     Dose/Rate Route Frequency Ordered Stop   02/05/21  1000  amoxicillin-clavulanate (AUGMENTIN) 875-125 MG per tablet 1 tablet        1 tablet Oral Every 12 hours 02/05/21 0818 02/26/21 0959   02/03/21 1700  entecavir (BARACLUDE) tablet 0.5 mg        0.5 mg Oral Daily 02/03/21 1321     02/02/21 1030  cefTRIAXone (ROCEPHIN) 2 g in sodium chloride 0.9 % 100 mL IVPB  Status:  Discontinued        2 g 200 mL/hr over 30 Minutes Intravenous Every 24 hours 02/02/21 0941 02/05/21 0818   02/01/21 1100  Ampicillin-Sulbactam (UNASYN) 3 g in sodium chloride 0.9 % 100 mL IVPB  Status:  Discontinued        3 g 200 mL/hr over 30 Minutes Intravenous Every 6 hours 02/01/21 1007 02/02/21 0941   01/31/21 0500  cefTRIAXone (ROCEPHIN) 2 g in sodium chloride 0.9 % 100 mL IVPB  Status:  Discontinued        2 g 200 mL/hr over 30 Minutes Intravenous Every 24 hours 01/30/21 1004 02/01/21 1007   01/30/21 1015  entecavir (BARACLUDE) tablet 0.5 mg  Status:  Discontinued       Note to Pharmacy: TAKE (1) TABLET BY MOUTH ONCE DAILY.     0.5 mg Oral Daily 01/30/21 1004 02/02/21 0925  01/30/21 0515  cefTRIAXone (ROCEPHIN) 2 g in sodium chloride 0.9 % 100 mL IVPB        2 g 200 mL/hr over 30 Minutes Intravenous  Once 01/30/21 0509 01/30/21 0615   01/30/21 0515  azithromycin (ZITHROMAX) 500 mg in sodium chloride 0.9 % 250 mL IVPB  Status:  Discontinued        500 mg 250 mL/hr over 60 Minutes Intravenous Every 24 hours 01/30/21 0509 02/02/21 1357   01/30/21 0315  doxycycline (VIBRA-TABS) tablet 100 mg        100 mg Oral  Once 01/30/21 0314 01/30/21 0328        I have personally reviewed the following labs and images: CBC: Recent Labs  Lab 02/12/21 0102 02/13/21 0140 02/15/21 0114  WBC 18.3* 18.8* 14.3*  NEUTROABS  --   --  9.2*  HGB 7.6* 7.8* 8.4*  HCT 25.2* 26.0* 27.3*  MCV 88.1 87.5 87.5  PLT 517* 526* 543*   BMP &GFR Recent Labs  Lab 02/12/21 0102  NA 134*  K 3.8  CL 98  CO2 29  GLUCOSE 133*  BUN 10  CREATININE 0.48  CALCIUM 8.4*  MG 2.1  PHOS 3.6    Estimated Creatinine Clearance: 65.2 mL/min (by C-G formula based on SCr of 0.48 mg/dL). Liver & Pancreas: Recent Labs  Lab 02/12/21 0102  ALBUMIN 1.9*   No results for input(s): LIPASE, AMYLASE in the last 168 hours. No results for input(s): AMMONIA in the last 168 hours. Diabetic: No results for input(s): HGBA1C in the last 72 hours. No results for input(s): GLUCAP in the last 168 hours. Cardiac Enzymes: No results for input(s): CKTOTAL, CKMB, CKMBINDEX, TROPONINI in the last 168 hours. No results for input(s): PROBNP in the last 8760 hours. Coagulation Profile: No results for input(s): INR, PROTIME in the last 168 hours. Thyroid Function Tests: No results for input(s): TSH, T4TOTAL, FREET4, T3FREE, THYROIDAB in the last 72 hours. Lipid Profile: No results for input(s): CHOL, HDL, LDLCALC, TRIG, CHOLHDL, LDLDIRECT in the last 72 hours. Anemia Panel: No results for input(s): VITAMINB12, FOLATE, FERRITIN, TIBC, IRON, RETICCTPCT in the last 72 hours.  Urine analysis:    Component Value Date/Time   COLORURINE YELLOW 01/30/2021 0504   APPEARANCEUR CLEAR 01/30/2021 0504   APPEARANCEUR Clear 06/30/2016 1013   LABSPEC 1.043 (H) 01/30/2021 0504   PHURINE 6.0 01/30/2021 0504   GLUCOSEU NEGATIVE 01/30/2021 0504   HGBUR NEGATIVE 01/30/2021 0504   BILIRUBINUR NEGATIVE 01/30/2021 0504   BILIRUBINUR Negative 06/30/2016 [place] 01/30/2021 0504   PROTEINUR NEGATIVE 01/30/2021 0504   UROBILINOGEN 0.2 04/13/2011 0330   NITRITE POSITIVE (A) 01/30/2021 0504   LEUKOCYTESUR NEGATIVE 01/30/2021 0504   Sepsis Labs: Invalid input(s): PROCALCITONIN, [place]  Microbiology: Recent Results (from the past 240 hour(s))  SARS CORONAVIRUS 2 (TAT 6-24 HRS) Nasopharyngeal Nasopharyngeal Swab     Status: None   Collection Time: 02/14/21  7:40 PM   Specimen: Nasopharyngeal Swab  Result Value Ref Range Status   SARS Coronavirus 2 NEGATIVE NEGATIVE Final    Comment:  (NOTE) SARS-CoV-2 target nucleic acids are NOT DETECTED.  The SARS-CoV-2 RNA is generally detectable in upper and lower respiratory specimens during the acute phase of infection. Negative results do not preclude SARS-CoV-2 infection, do not rule out co-infections with other pathogens, and should not be used as the sole basis for treatment or other patient management decisions. Negative results must be combined with clinical observations, patient history, and epidemiological information. The  expected result is Negative.  Fact Sheet for Patients: SugarRoll.be  Fact Sheet for Healthcare Providers: https://www.woods-mathews.com/  This test is not yet approved or cleared by the [place] FDA and  has been authorized for detection and/or diagnosis of SARS-CoV-2 by FDA under an Emergency Use Authorization (EUA). This EUA will remain  in effect (meaning this test can be used) for the duration of the COVID-19 declaration under Se ction 564(b)(1) of the Act, 21 U.S.C. section 360bbb-3(b)(1), unless the authorization is terminated or revoked sooner.  Performed at [facility], [place] [address], [place], [place] [address]      Radiology Studies: No results found.    Torie Towle T. [place]  If 7PM-7AM, please contact night-coverage [email or web address] 02/16/2021, 2:53 PM

## 2021-02-16 NOTE — Progress Notes (Signed)
Mobility Specialist Progress Note:   02/16/21 1035  Mobility  Activity Ambulated in hall  Level of Assistance Independent  Assistive Device Other (Comment) (HHA)  Distance Ambulated (ft) 290 ft  Mobility Ambulated with assistance in hallway  Mobility Response Tolerated well  Mobility performed by Mobility specialist  $Mobility charge 1 Mobility   Pt c/o abdominal pain before/during/after ambulation. Requested HHA for comfort, but independent otherwise.   Nelta Numbers Mobility Specialist  Phone [phone]

## 2021-02-16 NOTE — Progress Notes (Signed)
Nutrition Follow-up  DOCUMENTATION CODES:   Severe malnutrition in context of chronic illness, Underweight  INTERVENTION:   Continue Ensure Enlive po TID, each supplement provides 350 kcal and 20 grams of protein  Continue Multivitamin w/ minerals daily  Encourage good PO intake  NUTRITION DIAGNOSIS:   Severe Malnutrition related to chronic illness (met Ovarian cancer) as evidenced by severe muscle depletion, severe fat depletion, percent weight loss - Ongoing  GOAL:   Patient will meet greater than or equal to 90% of their needs - Progressing   MONITOR:   PO intake, Supplement acceptance, Weight trends  REASON FOR ASSESSMENT:   Malnutrition Screening Tool    ASSESSMENT:   Patient is an underweight 50 yo female with hx of schizophrenia who presents with ambulation difficulty. Community acquired pneumonia, large right sided PE (pending diagnostic  thoracentesis), right adnexal mass concerning for malignancy per MD.  11/01 Chest tube placed  11/07 transferred out of [facility]  Pt reports that she is doing well, that she is still drinking her Ensures. Pt was eating breakfast at time of visit, PLDN assisted pt with setting up breakfast.  Per EMR, pt intake includes: 11/11 - Breakfast 10%, Dinner 100% 11/12 - Lunch 50% 11/13 - Breakfast 25%, Lunch 15%  Per CM note, pt is possible discharge back to group home after evaluation on 11/16.  Medications reviewed and include: Augmentin, Entecavir, Remeron, MVI, Miralax, Senokot Labs reviewed.  Diet Order:   Diet Order             Diet regular Room service appropriate? Yes; Fluid consistency: Thin  Diet effective now                   EDUCATION NEEDS:   Not appropriate for education at this time  Skin:  Skin Assessment: Reviewed RN Assessment  Last BM:  02/14/2021  Height:   Ht Readings from Last 1 Encounters:  02/01/21 5' 4" (1.626 m)    Weight:   Wt Readings from Last 1 Encounters:  02/08/21 48 kg     Ideal Body Weight:  54.6 kg  BMI:  Body mass index is 18.16 kg/m.  Estimated Nutritional Needs:   Kcal:  1700-1900  Protein:  85-100 grams  Fluid:  > 1.7 L      BS, PLDN Clinical Dietitian See AMiON for contact information.   

## 2021-02-16 NOTE — Progress Notes (Signed)
Occupational Therapy Treatment Patient Details Name: Margaret [name] MRN: [id number] DOB: 01/27/1971 Today's Date: 02/16/2021   History of present illness 50 year old F admitted 10/28 initially admitted at [facility] for community-acquired pneumonia.  CT showed large loculated right pleural effusion.  She was transferred to [facility] 10/30. She had fibrinolytics on 11/1 and 11/2.  Chest tube removed on 11/4.  However, she had enlarging residual posterior effusion for which she required pigtail catheter chest tube from 11/5-11/11. PMH of intellectual disability, childhood TB, schizophrenia, hepatitis B, ovarian cancer with elevated CA 125   OT comments  Pt min guard - supervision for transfers and standing ADLs during session, pt independent with bed mobility and requires minimal cuing for task sequencing during therapy session. Pt asking repetitive questions during session, also requests to go to a group home in [place] to be closer to parents. Activity tolerance limiting pt at this time, but is very close to baseline. Will continue to follow to address acute OT needs and maximize safety with ADLs. Pt to benefit from d/c to group home.    Recommendations for follow up therapy are one component of a multi-disciplinary discharge planning process, led by the attending physician.  Recommendations may be updated based on patient status, additional functional criteria and insurance authorization.    Follow Up Recommendations  No OT follow up (group home, [place] to be in [place]    Assistance Recommended at Discharge Intermittent Supervision/Assistance  Equipment Recommendations       Recommendations for Other Services PT consult    Precautions / Restrictions Precautions Precautions: Fall Restrictions Weight Bearing Restrictions: No       Mobility Bed Mobility Overal bed mobility: Independent                  Transfers Overall transfer  level: Needs assistance Equipment used: None Transfers: Sit to/from Stand Sit to Stand: Supervision                 Balance Overall balance assessment: No apparent balance deficits (not formally assessed) Sitting-balance support: Feet supported Sitting balance-Leahy Scale: Normal     Standing balance support: During functional activity Standing balance-Leahy Scale: Good                             ADL either performed or assessed with clinical judgement   ADL Overall ADL's : Needs assistance/impaired     Grooming: Wash/dry hands;Supervision/safety;Standing                   Toilet Transfer: Supervision/safety;Ambulation   Toileting- Clothing Manipulation and Hygiene: Supervision/safety;Sit to/from stand Toileting - Clothing Manipulation Details (indicate cue type and reason): able to manage gown/complete pericare in standing     Functional mobility during ADLs: Min guard General ADL Comments: requires min sequential cues to complete tasks    Extremity/Trunk Assessment Upper Extremity Assessment Upper Extremity Assessment: Overall WFL for tasks assessed   Lower Extremity Assessment Lower Extremity Assessment: Defer to PT evaluation        Vision   Vision Assessment?: No apparent visual deficits   Perception Perception Perception: Not tested   Praxis Praxis Praxis: Not tested    Cognition Arousal/Alertness: Awake/alert Behavior During Therapy: WFL for tasks assessed/performed Overall Cognitive Status: History of cognitive impairments - at baseline  General Comments: pt requiring cues an explanation of activity before engaging in activity          Exercises     Shoulder Instructions       General Comments pt asking repetitive questions throughout session    Pertinent Vitals/ Pain       Pain Assessment: No/denies pain  Home Living                                           Prior Functioning/Environment              Frequency  Min 2X/week        Progress Toward Goals  OT Goals(current goals can now be found in the care plan section)  Progress towards OT goals: Progressing toward goals  Acute Rehab OT Goals Patient Stated Goal: go to group home in [place] OT Goal Formulation: With patient Time For Goal Achievement: 02/28/21 Potential to Achieve Goals: Good ADL Goals Pt Will Perform Grooming: with modified independence;standing Pt Will Perform Upper Body Dressing: with modified independence;sitting Pt Will Perform Lower Body Dressing: with modified independence;sit to/from stand Pt Will Transfer to Toilet: with modified independence;ambulating Pt Will Perform Toileting - Clothing Manipulation and hygiene: with modified independence;sit to/from stand  Plan Frequency remains appropriate;Discharge plan needs to be updated    Co-evaluation                 AM-PAC OT "6 Clicks" Daily Activity     Outcome Measure   Help from another person eating meals?: None Help from another person taking care of personal grooming?: None Help from another person toileting, which includes using toliet, bedpan, or urinal?: A Little Help from another person bathing (including washing, rinsing, drying)?: A Little Help from another person to put on and taking off regular upper body clothing?: None Help from another person to put on and taking off regular lower body clothing?: A Little 6 Click Score: 21    End of Session Equipment Utilized During Treatment: Gait belt  OT Visit Diagnosis: Unsteadiness on feet (R26.81);Muscle weakness (generalized) (M62.81)   Activity Tolerance Patient tolerated treatment well   Patient Left in bed;with call bell/phone within reach;with bed alarm set   Nurse Communication Mobility status        Time: 9030-0923 OT Time Calculation (min): 19 min  Charges: OT General Charges $OT Visit: 1 Visit OT  Treatments $Self Care/Home Management : 8-22 mins    Lynnda Child, OTD, OTR/L Acute Rehab [phone]   Kaylyn Lim 02/16/2021, 3:09 PM

## 2021-02-16 NOTE — Plan of Care (Signed)
  Problem: Clinical Measurements: Goal: Ability to maintain clinical measurements within normal limits will improve Outcome: Progressing Goal: Respiratory complications will improve Outcome: Progressing Goal: Cardiovascular complication will be avoided Outcome: Progressing

## 2021-02-17 DIAGNOSIS — B181 Chronic viral hepatitis B without delta-agent: Secondary | ICD-10-CM | POA: Diagnosis not present

## 2021-02-17 DIAGNOSIS — J690 Pneumonitis due to inhalation of food and vomit: Secondary | ICD-10-CM | POA: Diagnosis not present

## 2021-02-17 DIAGNOSIS — J9601 Acute respiratory failure with hypoxia: Secondary | ICD-10-CM | POA: Diagnosis not present

## 2021-02-17 DIAGNOSIS — N83299 Other ovarian cyst, unspecified side: Secondary | ICD-10-CM | POA: Diagnosis not present

## 2021-02-17 MED ORDER — CHLORPROMAZINE HCL 25 MG PO TABS
50.0000 mg | ORAL_TABLET | Freq: Two times a day (BID) | ORAL | Status: DC | PRN
  Filled 2021-02-17: qty 2

## 2021-02-17 NOTE — TOC Progression Note (Addendum)
Transition of Care [facility]) - Progression Note    Patient Details  Name: Margaret [name] MRN: [id number] Date of Birth: 1971-04-03  Transition of [facility] CM/SW Contact  Emeterio Reeve, [place] Phone Number: 02/17/2021, 2:53 PM  Clinical Narrative:     8:30am- CSW received test from [name] stating she will be by to evaluate pt about 12pm. CSW informed Ms petty pt is ready for DC at that time.   2:00pm- CSW called and texted Ms petty to find out of they had evaluated pt. There was no answer. CSW spoke with pt who stated they came by but she wants to go to a new group home. CSW explained that is something that cant be changed at the hospital and she should speak to her legal guardian about that.   3:00pm- CSW called Ms petty again, with no answer. CSW called [facility] to set up transportation for discharge. CSW left message with contact info.   3:50pm- CSW spoke to Ms. Mindi Junker, they are able to accept pt back tomorrow. They will be able to pick pt up between 2-3pm tomorrow. [name] will text CSW when they are on the way so pt can be waiting outside. Ms petty request a copy of pts DC summary with her AVS.    Expected Discharge Plan: Group Home Barriers to Discharge: Continued Medical Work up  Expected Discharge Plan and Services Expected Discharge Plan: [place] arrangements for the past 2 months: Group Home Expected Discharge Date: 02/15/21                                     Social Determinants of Health (SDOH) Interventions    Readmission Risk Interventions No flowsheet data found.  Emeterio Reeve, LCSW Clinical Social Worker

## 2021-02-17 NOTE — Progress Notes (Signed)
Physical Therapy Treatment Patient Details Name: Margaret [name] MRN: [id number] DOB: 13-Jan-1971 Today's Date: 02/17/2021   History of Present Illness 50 year old F admitted 10/28 initially admitted at [facility] for community-acquired pneumonia.  CT showed large loculated right pleural effusion.  She was transferred to [facility] 10/30. She had fibrinolytics on 11/1 and 11/2.  Chest tube removed on 11/4.  However, she had enlarging residual posterior effusion for which she required pigtail catheter chest tube from 11/5-11/11. PMH of intellectual disability, childhood TB, schizophrenia, hepatitis B, ovarian cancer with elevated CA 125    PT Comments    Received pt semi-reclined in bed watching TV. Therapist suggested OOB mobility, however pt politely declined going for a walk stating "I did that earlier" and requested to remain in bed and watch [employer]. Suggested practicing stair navigation or exercises but pt continued to decline but reported urge to void when asked. Pt performed bed mobility independently and ambulated to/from bathroom without AD and supervision. Pt required cues for sequencing of toileting tasks and required assist from therapist to set up food tray. Acute PT to cont to follow.    Recommendations for follow up therapy are one component of a multi-disciplinary discharge planning process, led by the attending physician.  Recommendations may be updated based on patient status, additional functional criteria and insurance authorization.  Follow Up Recommendations  No PT follow up     Assistance Recommended at Discharge Set up Supervision/Assistance  Equipment Recommendations  None recommended by PT    Recommendations for Other Services       Precautions / Restrictions Precautions Precautions: Fall Restrictions Weight Bearing Restrictions: No     Mobility  Bed Mobility Overal bed mobility: Independent             General bed mobility comments: HOB  elevated    Transfers Overall transfer level: Needs assistance Equipment used: None Transfers: Sit to/from Stand Sit to Stand: Supervision           General transfer comment: pt required close supervision upon standing up due to generalized unsteadiness but no true LOB    Ambulation/Gait Ambulation/Gait assistance: Supervision Gait Distance (Feet): 10 Feet Assistive device: None Gait Pattern/deviations: Step-through pattern;Decreased stride length Gait velocity: minimally decreased Gait velocity interpretation: 1.31 - 2.62 ft/sec, indicative of limited community ambulator   General Gait Details: pt ambulated in/out of bathroom with close supervision; generalized unsteadiness noted   Stairs             Wheelchair Mobility    Modified Rankin (Stroke Patients Only)       Balance Overall balance assessment: Needs assistance Sitting-balance support: Feet supported;No upper extremity supported Sitting balance-Leahy Scale: Normal     Standing balance support: No upper extremity supported;During functional activity Standing balance-Leahy Scale: Good Standing balance comment: standing at toilet, no LOB                            Cognition Arousal/Alertness: Awake/alert Behavior During Therapy: WFL for tasks assessed/performed Overall Cognitive Status: History of cognitive impairments - at baseline                                 General Comments: pt required step by step instructions for toileting tasks        Exercises      General Comments General comments (skin integrity, edema, etc.): Pt politely declined  going for a walk. practicing navigating stairs, or working on exercises, requesting to lie in bed and watch [employer]. However, did have to use restroom when therapist asked.      Pertinent Vitals/Pain Pain Assessment: 0-10 Pain Score: 6  Pain Location: abdomen Pain Descriptors / Indicators:  Aching;Discomfort;Sore Pain Intervention(s): Monitored during session;Repositioned;Limited activity within patient's tolerance    Home Living                          Prior Function            PT Goals (current goals can now be found in the care plan section) Acute Rehab PT Goals Patient Stated Goal: to return to group home PT Goal Formulation: With patient Time For Goal Achievement: 02/26/21 Potential to Achieve Goals: Good Progress towards PT goals: Progressing toward goals    Frequency    Min 3X/week      PT Plan      Co-evaluation              AM-PAC PT "6 Clicks" Mobility   Outcome Measure  Help needed turning from your back to your side while in a flat bed without using bedrails?: None Help needed moving from lying on your back to sitting on the side of a flat bed without using bedrails?: None Help needed moving to and from a bed to a chair (including a wheelchair)?: None Help needed standing up from a chair using your arms (e.g., wheelchair or bedside chair)?: None Help needed to walk in hospital room?: A Little Help needed climbing 3-5 steps with a railing? : A Little 6 Click Score: 22    End of Session Equipment Utilized During Treatment: Gait belt Activity Tolerance: Patient tolerated treatment well;Other (comment) (limited session due to pt requesting to watch [employer]) Patient left: in bed;with call bell/phone within reach;with bed alarm set Nurse Communication: Mobility status PT Visit Diagnosis: Muscle weakness (generalized) (M62.81);Unsteadiness on feet (R26.81)     Time: 1345-1400 PT Time Calculation (min) (ACUTE ONLY): 15 min  Charges:  $Therapeutic Activity: 8-22 mins                     Becky Sax PT, DPT  Blenda Nicely 02/17/2021, 2:12 PM

## 2021-02-17 NOTE — Progress Notes (Signed)
Mobility Specialist Progress Note:   02/17/21 1445  Mobility  Activity Ambulated to bathroom  Level of Assistance Independent  Assistive Device None  Distance Ambulated (ft) 20 ft  Mobility Out of bed for toileting  Mobility Response Tolerated well  Mobility performed by Mobility specialist  $Mobility charge 1 Mobility   Responded to bed alarm, pt amb to BR independently. Required supervision for safety.    Nelta Numbers Mobility Specialist  Phone [phone]

## 2021-02-17 NOTE — Progress Notes (Signed)
Mobility Specialist Progress Note:   02/17/21 1145  Mobility  Activity Ambulated in hall  Level of Assistance Standby assist, set-up cues, supervision of patient - no hands on  Assistive Device Other (Comment) (HHA)  Distance Ambulated (ft) 570 ft  Mobility Out of bed for toileting;Ambulated with assistance in hallway  Mobility Response Tolerated well  Mobility performed by Mobility specialist  $Mobility charge 1 Mobility   Pt asx during ambulation. Handheld assist for comfort.   Nelta Numbers Mobility Specialist  Phone [phone]

## 2021-02-17 NOTE — Progress Notes (Signed)
PROGRESS NOTE  Margaret [name] YBW:[id number] DOB: 1970/05/26   PCP: Dettinger, Fransisca Kaufmann, MD  Patient is from: Group home  DOA: 01/29/2021 LOS: 19  Chief complaints:  Chief Complaint  Patient presents with   Abdominal Pain     Brief Narrative / Interim history: 50 year old F with PMH of intellectual disability, childhood TB, schizophrenia, hepatitis B, ovarian cancer with elevated CA 125 presenting with progressive cough.  Initially admitted at [facility] for community-acquired pneumonia.  CT showed large loculated right pleural effusion.  She was transferred to [facility] She underwent right chest tube placement on 11/12 by PCCM.  She had fibrinolytics on 11/1 and 11/2.  Chest tube removed on 11/4.  However, she had enlarging residual posterior effusion for which she required pigtail catheter from 11/5-11/11.  She also had additional fibrinolytics.  Remains on p.o. Augmentin for possible aspiration pneumonia with right empyema. Currently on room air.  PCCM recommended p.o. antibiotics for a total of 21 days which ends on 02/26/2021.  She also have a right adrenal mass and cystic ovarian lesion with elevated CEA 125 for which outpatient follow-up are recommended.  Patient is medically stable for discharge.  Group home to assess patient to see if they can take her back  Subjective: Seen and examined earlier this morning.  No major events overnight of this morning.  No complaints.  Denies chest pain or shortness of breath.  Asking for Kleenex box and sasha cola as usual.  Objective: Vitals:   02/17/21 0000 02/17/21 0034 02/17/21 0404 02/17/21 0952  BP: (!) 78/54 91/65 95/68 93/65  Pulse: 97 91 93 89  Resp:   16 14  Temp: (!) 97.4 F (36.3 C) 97.8 F (36.6 C) 97.9 F (36.6 C) 98.5 F (36.9 C)  TempSrc: Oral Oral Oral Oral  SpO2: 97% 96% 97% 99%  Weight:      Height:        Intake/Output Summary (Last 24 hours) at 02/17/2021 1132 Last data filed at 02/16/2021 2140 Gross per  24 hour  Intake 360 ml  Output --  Net 360 ml    Filed Weights   02/06/21 0500 02/07/21 0500 02/08/21 0600  Weight: 51.9 kg 48.8 kg 48 kg    Examination:  GENERAL: No apparent distress.  Nontoxic. HEENT: MMM.  Vision and hearing grossly intact.  NECK: Supple.  No apparent JVD.  RESP:  No IWOB.  Fair aeration bilaterally. CVS:  RRR. Heart sounds normal.  ABD/GI/GU: BS+. Abd soft, NTND.  MSK/EXT:  Moves extremities. No apparent deformity. No edema.  SKIN: no apparent skin lesion or wound NEURO: Awake and alert. Oriented appropriately.  No apparent focal neuro deficit. PSYCH: Calm. Normal affect.   Procedures:  11/1-placement of right chest tube 11/5-11/11- posterior pigtail catheter  Microbiology summarized: 10/29-COVID-19 and influenza PCR nonreactive. 10/29-blood cultures negative. 10/31-full RVP and MRSA PCR screen negative. 11/1-blood cultures negative.  Assessment & Plan: Recurrent aspiration pneumonia with right empyema -S/p chest tube and pigtail as above.  Follow-up cultures NGTD. -Repeat CXR on 11/11 with patchy infiltrates bilaterally, small bilateral pleural effusions and no PTX. -CTX and Zithromax>>> p.o. Augmentin for a total of 3 weeks which ends on 02/26/2021 per PCCM -PCCM to arrange outpatient follow-up -SLP recommended regular clear liquid diet. -Leukocytosis improved.    Right adrenal mass Cystic right ovarian lesion with elevated CEA 125 -Needs outpatient follow-up with urology and Gyn/Onc -We will place referral on discharge.  Schizophrenia: Somewhat stable.  -Continue home clozapine, chlorpromazine, benztropine and  Prozac.   Hypokalemia: Resolved.  Normocytic anemia: H&H relatively stable. Recent Labs    02/04/21 0253 02/05/21 0552 02/06/21 0653 02/07/21 0127 02/08/21 0012 02/09/21 0152 02/09/21 1109 02/12/21 0102 02/13/21 0140 02/15/21 0114  HGB 7.6* 7.6* 7.3* 7.2* 8.1* 7.8* 8.1* 7.6* 7.8* 8.4*  -Continue monitoring -Transfuse  for Hgb less than 7.0.  History of hepatitis B infection -Continue home Entecavir.  Intellectual disability: From group home.  Seems to be fairly oriented but does not seem to have great insight.  Not fully cooperative.  Leukocytosis/bandemia/thrombocytosis: Improved. -Continue monitoring intermittently  Physical deconditioning -PT/OT if she cooperates.  Severe malnutrition: POA.  As evidenced by low BMI, significant muscle mass and subcu fat loss. Body mass index is 18.16 kg/m. Nutrition Problem: Severe Malnutrition Etiology: chronic illness (met Ovarian cancer) Signs/Symptoms: severe muscle depletion, severe fat depletion, percent weight loss Percent weight loss: 15.3 % Interventions: Ensure Enlive (each supplement provides 350kcal and 20 grams of protein), MVI   DVT prophylaxis:  heparin injection 5,000 Units Start: 01/30/21 1400 SCDs Start: 01/30/21 1003  Code Status: Full code Family Communication: Patient and/or RN. Available if any question.  Level of care: Med-Surg Status is: Inpatient  Remains inpatient appropriate because: Safe disposition   Final disposition: Back to group home when they can take her back.  Medically stable for discharge    Consultants:  Pulmonology-signed off   Sch Meds:  Scheduled Meds:  amoxicillin-clavulanate  1 tablet Oral Q12H   benztropine  1 mg Oral BID   Chlorhexidine Gluconate Cloth  6 each Topical Daily   cloZAPine  250 mg Oral QHS   entecavir  0.5 mg Oral Daily   feeding supplement  237 mL Oral TID BM   FLUoxetine  20 mg Oral Daily   fluticasone  1 spray Each Nare Daily   heparin  5,000 Units Subcutaneous Q8H   mirtazapine  7.5 mg Oral QHS   multivitamin with minerals  1 tablet Oral Daily   polyethylene glycol  17 g Oral Daily   senna-docusate  1 tablet Oral Daily   sodium chloride  1 spray Each Nare BID   sodium chloride flush  10 mL Other Q8H   Continuous Infusions: PRN Meds:.acetaminophen, albuterol,  chlorproMAZINE, fentaNYL (SUBLIMAZE) injection, hydrOXYzine, morphine injection  Antimicrobials: Anti-infectives (From admission, onward)    Start     Dose/Rate Route Frequency Ordered Stop   02/05/21 1000  amoxicillin-clavulanate (AUGMENTIN) 875-125 MG per tablet 1 tablet        1 tablet Oral Every 12 hours 02/05/21 0818 02/26/21 0959   02/03/21 1700  entecavir (BARACLUDE) tablet 0.5 mg        0.5 mg Oral Daily 02/03/21 1321     02/02/21 1030  cefTRIAXone (ROCEPHIN) 2 g in sodium chloride 0.9 % 100 mL IVPB  Status:  Discontinued        2 g 200 mL/hr over 30 Minutes Intravenous Every 24 hours 02/02/21 0941 02/05/21 0818   02/01/21 1100  Ampicillin-Sulbactam (UNASYN) 3 g in sodium chloride 0.9 % 100 mL IVPB  Status:  Discontinued        3 g 200 mL/hr over 30 Minutes Intravenous Every 6 hours 02/01/21 1007 02/02/21 0941   01/31/21 0500  cefTRIAXone (ROCEPHIN) 2 g in sodium chloride 0.9 % 100 mL IVPB  Status:  Discontinued        2 g 200 mL/hr over 30 Minutes Intravenous Every 24 hours 01/30/21 1004 02/01/21 1007   01/30/21 1015  entecavir (BARACLUDE) tablet  0.5 mg  Status:  Discontinued       Note to Pharmacy: TAKE (1) TABLET BY MOUTH ONCE DAILY.     0.5 mg Oral Daily 01/30/21 1004 02/02/21 0925   01/30/21 0515  cefTRIAXone (ROCEPHIN) 2 g in sodium chloride 0.9 % 100 mL IVPB        2 g 200 mL/hr over 30 Minutes Intravenous  Once 01/30/21 0509 01/30/21 0615   01/30/21 0515  azithromycin (ZITHROMAX) 500 mg in sodium chloride 0.9 % 250 mL IVPB  Status:  Discontinued        500 mg 250 mL/hr over 60 Minutes Intravenous Every 24 hours 01/30/21 0509 02/02/21 1357   01/30/21 0315  doxycycline (VIBRA-TABS) tablet 100 mg        100 mg Oral  Once 01/30/21 0314 01/30/21 0328        I have personally reviewed the following labs and images: CBC: Recent Labs  Lab 02/12/21 0102 02/13/21 0140 02/15/21 0114  WBC 18.3* 18.8* 14.3*  NEUTROABS  --   --  9.2*  HGB 7.6* 7.8* 8.4*  HCT 25.2* 26.0*  27.3*  MCV 88.1 87.5 87.5  PLT 517* 526* 543*   BMP &GFR Recent Labs  Lab 02/12/21 0102  NA 134*  K 3.8  CL 98  CO2 29  GLUCOSE 133*  BUN 10  CREATININE 0.48  CALCIUM 8.4*  MG 2.1  PHOS 3.6   Estimated Creatinine Clearance: 65.2 mL/min (by C-G formula based on SCr of 0.48 mg/dL). Liver & Pancreas: Recent Labs  Lab 02/12/21 0102  ALBUMIN 1.9*   No results for input(s): LIPASE, AMYLASE in the last 168 hours. No results for input(s): AMMONIA in the last 168 hours. Diabetic: No results for input(s): HGBA1C in the last 72 hours. No results for input(s): GLUCAP in the last 168 hours. Cardiac Enzymes: No results for input(s): CKTOTAL, CKMB, CKMBINDEX, TROPONINI in the last 168 hours. No results for input(s): PROBNP in the last 8760 hours. Coagulation Profile: No results for input(s): INR, PROTIME in the last 168 hours. Thyroid Function Tests: No results for input(s): TSH, T4TOTAL, FREET4, T3FREE, THYROIDAB in the last 72 hours. Lipid Profile: No results for input(s): CHOL, HDL, LDLCALC, TRIG, CHOLHDL, LDLDIRECT in the last 72 hours. Anemia Panel: No results for input(s): VITAMINB12, FOLATE, FERRITIN, TIBC, IRON, RETICCTPCT in the last 72 hours.  Urine analysis:    Component Value Date/Time   COLORURINE YELLOW 01/30/2021 0504   APPEARANCEUR CLEAR 01/30/2021 0504   APPEARANCEUR Clear 06/30/2016 1013   LABSPEC 1.043 (H) 01/30/2021 0504   PHURINE 6.0 01/30/2021 0504   GLUCOSEU NEGATIVE 01/30/2021 0504   HGBUR NEGATIVE 01/30/2021 0504   BILIRUBINUR NEGATIVE 01/30/2021 0504   BILIRUBINUR Negative 06/30/2016 [place] 01/30/2021 0504   PROTEINUR NEGATIVE 01/30/2021 0504   UROBILINOGEN 0.2 04/13/2011 0330   NITRITE POSITIVE (A) 01/30/2021 0504   LEUKOCYTESUR NEGATIVE 01/30/2021 0504   Sepsis Labs: Invalid input(s): PROCALCITONIN, [place]  Microbiology: Recent Results (from the past 240 hour(s))  SARS CORONAVIRUS 2 (TAT 6-24 HRS) Nasopharyngeal  Nasopharyngeal Swab     Status: None   Collection Time: 02/14/21  7:40 PM   Specimen: Nasopharyngeal Swab  Result Value Ref Range Status   SARS Coronavirus 2 NEGATIVE NEGATIVE Final    Comment: (NOTE) SARS-CoV-2 target nucleic acids are NOT DETECTED.  The SARS-CoV-2 RNA is generally detectable in upper and lower respiratory specimens during the acute phase of infection. Negative results do not preclude SARS-CoV-2 infection, do not  rule out co-infections with other pathogens, and should not be used as the sole basis for treatment or other patient management decisions. Negative results must be combined with clinical observations, patient history, and epidemiological information. The expected result is Negative.  Fact Sheet for Patients: SugarRoll.be  Fact Sheet for Healthcare Providers: https://www.woods-mathews.com/  This test is not yet approved or cleared by the [place] FDA and  has been authorized for detection and/or diagnosis of SARS-CoV-2 by FDA under an Emergency Use Authorization (EUA). This EUA will remain  in effect (meaning this test can be used) for the duration of the COVID-19 declaration under Se ction 564(b)(1) of the Act, 21 U.S.C. section 360bbb-3(b)(1), unless the authorization is terminated or revoked sooner.  Performed at [facility], [place] [place], [place] [address]      Radiology Studies: No results found.     T. [place]  If 7PM-7AM, please contact night-coverage [email or web address] 02/17/2021, 11:32 AM

## 2021-02-18 DIAGNOSIS — J189 Pneumonia, unspecified organism: Secondary | ICD-10-CM | POA: Diagnosis not present

## 2021-02-18 DIAGNOSIS — E43 Unspecified severe protein-calorie malnutrition: Secondary | ICD-10-CM | POA: Diagnosis not present

## 2021-02-18 DIAGNOSIS — J9601 Acute respiratory failure with hypoxia: Secondary | ICD-10-CM | POA: Diagnosis not present

## 2021-02-18 DIAGNOSIS — F201 Disorganized schizophrenia: Secondary | ICD-10-CM | POA: Diagnosis not present

## 2021-02-18 LAB — RENAL FUNCTION PANEL
Albumin: 2.3 g/dL — ABNORMAL LOW (ref 3.5–5.0)
Anion gap: 8 (ref 5–15)
BUN: 11 mg/dL (ref 6–20)
CO2: 27 mmol/L (ref 22–32)
Calcium: 8.8 mg/dL — ABNORMAL LOW (ref 8.9–10.3)
Chloride: 98 mmol/L (ref 98–111)
Creatinine, Ser: 0.65 mg/dL (ref 0.44–1.00)
GFR, Estimated: >60 mL/min (ref >60)
Glucose, Bld: 113 mg/dL — ABNORMAL HIGH (ref 70–99)
Phosphorus: 4.4 mg/dL (ref 2.5–4.6)
Potassium: 3.7 mmol/L (ref 3.5–5.1)
Sodium: 133 mmol/L — ABNORMAL LOW (ref 135–145)

## 2021-02-18 LAB — CBC
HCT: 28.7 % — ABNORMAL LOW (ref 36.0–46.0)
Hemoglobin: 8.5 g/dL — ABNORMAL LOW (ref 12.0–15.0)
MCH: 26.1 pg (ref 26.0–34.0)
MCHC: 29.6 g/dL — ABNORMAL LOW (ref 30.0–36.0)
MCV: 88 fL (ref 80.0–100.0)
Platelets: 520 10*3/uL — ABNORMAL HIGH (ref 150–400)
RBC: 3.26 MIL/uL — ABNORMAL LOW (ref 3.87–5.11)
RDW: 15.7 % — ABNORMAL HIGH (ref 11.5–15.5)
WBC: 11.8 10*3/uL — ABNORMAL HIGH (ref 4.0–10.5)
nRBC: 0 % (ref 0.0–0.2)

## 2021-02-18 LAB — MAGNESIUM: Magnesium: 2.2 mg/dL (ref 1.7–2.4)

## 2021-02-18 MED ORDER — AMOXICILLIN-POT CLAVULANATE 875-125 MG PO TABS: 1.0000 | ORAL_TABLET | Freq: Two times a day (BID) | ORAL | 0 refills | Status: AC

## 2021-02-18 MED ORDER — SENNOSIDES-DOCUSATE SODIUM 8.6-50 MG PO TABS: 1.0000 | ORAL_TABLET | Freq: Two times a day (BID) | ORAL | 0 refills | Status: DC | PRN

## 2021-02-18 NOTE — TOC Transition Note (Signed)
Transition of Care [facility]) - CM/SW Discharge Note   Patient Details  Name: Shacoria Latif MRN: [id number] Date of Birth: November 29, 1970  Transition of Care [facility]) CM/SW Contact:  Emeterio Reeve, LCSW Phone Number: 02/18/2021, 1:21 PM   Clinical Narrative:     Pt is discharging back to her group home. The group home will be picking her up in their [place].   CSW faxed discharge summary to group home and legal guardian as requested.   Final next level of care: Group Home Barriers to Discharge: Barriers Resolved   Patient Goals and CMS Choice   CMS Medicare.gov Compare Post Acute Care list provided to:: Patient Represenative (must comment) Choice offered to / list presented to : [facility] POA / Guardian  Discharge Placement                Patient to be transferred to facility by: ptar Name of family member notified: Legal guardian is aware Patient and family notified of of transfer: 02/18/21  Discharge Plan and Services                                     Social Determinants of Health (SDOH) Interventions     Readmission Risk Interventions No flowsheet data found.   Emeterio Reeve, LCSW Clinical Social Worker

## 2021-02-18 NOTE — Discharge Summary (Signed)
Physician Discharge Summary  Margaret [name] FTD:[id number] DOB: 1970/08/28 DOA: 01/29/2021  PCP: Dettinger, Fransisca Kaufmann, MD  Admit date: 01/29/2021 Discharge date: 02/18/2021 Admitted From: Group home Disposition: Group home Recommendations for Outpatient Follow-up:  Follow ups as below. Patient has ambulatory referral to oncology for adrenal mass  Needs work-up for adrenal mass once infection clears. Placed ambulatory referral to gynecology for complex ovarian cyst Please obtain CBC/BMP/Mag at follow up Please follow up on the following pending results: None Home Health: Not indicated Equipment/Devices: Not indicated Discharge Condition: Stable CODE STATUS: Full code  Follow-up Information     [facility]. Schedule an appointment as soon as possible for a visit in 1 week(s).   Specialty: Pulmonology Contact information: [address] [place] [address] [phone]               Hospital Course: 50 year old F with PMH of intellectual disability, childhood TB, schizophrenia, hepatitis B, ovarian cancer with elevated CA 125 presenting with progressive cough.  Initially admitted at [facility] for community-acquired pneumonia.  CT showed large loculated right pleural effusion.  She was transferred to [facility]. She underwent right chest tube placement on 11/12 by PCCM.  She had fibrinolytics on 11/1 and 11/2.  Chest tube removed on 11/4.  However, she had enlarging residual posterior effusion for which she required pigtail catheter from 11/5-11/11.  She also had additional fibrinolytics.  Remains on p.o. Augmentin for possible aspiration pneumonia with right empyema. Currently on room air.  PCCM recommended p.o. antibiotics for a total of 21 days which ends on 02/26/2021.   She also have a right adrenal mass and cystic ovarian lesion with elevated CEA 125 for which outpatient follow-up are recommended.   Patient is medically stable for  discharge.  Group home to assess patient to see if they can take her back   See individual problem list below for more on hospital course.  Discharge Diagnoses:  Recurrent aspiration pneumonia with right empyema -S/p chest tube and pigtail as above.  Follow-up cultures NGTD. -Repeat CXR on 11/11 with patchy infiltrates bilaterally, small bilateral pleural effusions and no PTX. -CTX and Zithromax>>> p.o. Augmentin for a total of 3 weeks which ends on 02/26/2021 per PCCM -PCCM to arrange outpatient follow-up -SLP recommended regular diet. -Leukocytosis improved.     Right adrenal mass Cystic right ovarian lesion with elevated CEA 125 -Needs work-up for adrenal mass once infection clears. -Ambulatory referral to oncology and gynecology in place.  Schizophrenia: Stable. -Continue home clozapine, chlorpromazine, benztropine and Prozac.   Hypokalemia: Resolved.  Normocytic anemia: H&H stable. Recent Labs    02/05/21 0552 02/06/21 0653 02/07/21 0127 02/08/21 0012 02/09/21 0152 02/09/21 1109 02/12/21 0102 02/13/21 0140 02/15/21 0114 02/18/21 0129  HGB 7.6* 7.3* 7.2* 8.1* 7.8* 8.1* 7.6* 7.8* 8.4* 8.5*  -Recheck CBC in 1 week   History of hepatitis B infection -Continue home Entecavir.   Intellectual disability: From group home.  Seems to be fairly oriented but does not seem to have great insight.  Not fully cooperative.   Leukocytosis/bandemia/thrombocytosis: Improved. -Recheck CBC in 1 week   Physical deconditioning-no need identified by PT/OT.   Severe malnutrition: POA.  As evidenced by low BMI, significant muscle mass and subcu fat loss. Body mass index is 18.16 kg/m. Nutrition Problem: Severe Malnutrition Etiology: chronic illness (met Ovarian cancer) Signs/Symptoms: severe muscle depletion, severe fat depletion, percent weight loss Percent weight loss: 15.3 % Interventions: Ensure Enlive (each supplement provides 350kcal and  20 grams of protein), MVI      Discharge Exam: Vitals:   02/17/21 2100 02/18/21 0029 02/18/21 0513 02/18/21 0810  BP: 103/62 96/64 (!) 108/59 (!) 89/60  Pulse: (!) 106 (!) 105 92 86  Temp: 98 F (36.7 C) 98.4 F (36.9 C) 98.1 F (36.7 C) 97.8 F (36.6 C)  Resp: _0 Height:      Weight:      SpO2: 100% 98% 100% 100%  TempSrc: Oral Oral Oral   BMI (Calculated):         GENERAL: No apparent distress.  Nontoxic. HEENT: MMM.  Vision and hearing grossly intact.  NECK: Supple.  No apparent JVD.  RESP: On RA.  No IWOB.  Fair aeration bilaterally. CVS:  RRR. Heart sounds normal.  ABD/GI/GU: Bowel sounds present. Soft. Non tender.  MSK/EXT:  Moves extremities. No apparent deformity. No edema.  SKIN: no apparent skin lesion or wound NEURO: Awake and alert.  Oriented fairly.  Follows commands.  No apparent focal neuro deficit. PSYCH: Calm. Normal affect.  Has cognitive impairment.  Discharge Instructions  Discharge Instructions     Ambulatory referral to Gynecologic Oncology   Complete by: As directed       Allergies as of 02/18/2021   No Known Allergies      Medication List     STOP taking these medications    ibuprofen 600 MG tablet Commonly known as: ADVIL       TAKE these medications    acetaminophen 650 MG CR tablet Commonly known as: TYLENOL Take 650 mg by mouth every 8 (eight) hours as needed for pain.   albuterol 108 (90 Base) MCG/ACT inhaler Commonly known as: Ventolin HFA INHALE 2 PUFFS EVERY 4 HOURS AS NEEDED FOR WHEEZING.   amoxicillin-clavulanate 875-125 MG tablet Commonly known as: AUGMENTIN Take 1 tablet by mouth every 12 (twelve) hours for 8 days.   benztropine 1 MG tablet Commonly known as: COGENTIN Take 1 tablet (1 mg total) by mouth 2 (two) times daily.   CENTROVITE Tabs TAKE 1 TABLET BY MOUTH AT BEDTIME.   chlorproMAZINE 50 MG tablet Commonly known as: THORAZINE Take 50 mg by mouth as needed. One tablet by mouth PRN agitation, max of 2 tablets, 100  mg in 24 hours   cloZAPine 100 MG tablet Commonly known as: CLOZARIL Take 200 mg by mouth at bedtime.   entecavir 0.5 MG tablet Commonly known as: BARACLUDE TAKE (1) TABLET BY MOUTH ONCE DAILY.   Fish Oil + D3 1200-1000 MG-UNIT Caps TAKE 1 CAPSULE BY MOUTH TWICE DAILY.   FLUoxetine 20 MG capsule Commonly known as: PROZAC TAKE 3 CAPSULES BY MOUTH ONCE DAILY.   fluticasone 50 MCG/ACT nasal spray Commonly known as: FLONASE SPRAY 2 SPRAYS IN RIGHT NOSTRIL ONLY TWICE DAILY.   levonorgestrel 20 MCG/24HR IUD Commonly known as: MIRENA 1 each by Intrauterine route once.   Minerin Creme Crea APPLY MODERATE AMOUNT TOPICALLY TO DRY SKIN ON SOLES & HEELS OF BOTH FEET AFTER SHOWER. (NURSING STAFF TO SUPERVISE APPLICATION)   mirtazapine 7.5 MG tablet Commonly known as: REMERON Take 7.5 mg by mouth at bedtime.   polyethylene glycol powder 17 GM/SCOOP powder Commonly known as: GLYCOLAX/MIRALAX Take 17 g by mouth daily. What changed:  when to take this reasons to take this   senna-docusate 8.6-50 MG tablet Commonly known as: Senokot-S Take 1 tablet by mouth 2 (two) times daily between meals as needed for mild constipation.   Vitamin D3 50 MCG (  2000 UT) capsule TAKE 1 CAPSULE BY MOUTH EVERY MORNING.   vitamin E 200 UNIT capsule Take 1 capsule (200 Units total) by mouth daily.        Consultations: Pulmonology  Procedures/Studies: 11/1-placement of right chest tube 11/5-11/11- posterior pigtail catheter   DG Chest 1 View  Result Date: 02/08/2021 CLINICAL DATA:  Empyema with chest tube in place EXAM: CHEST  1 VIEW COMPARISON:  Prior chest x-ray 02/07/2021 FINDINGS: Right basilar pigtail thoracostomy tube in unchanged position. Small amount of residual pleural thickening versus pleural fluid persists. Increasing patchy airspace opacities in the periphery of the right lung. Persistent patchy airspace opacities in the left upper and mid lung without significant interval change.  No pneumothorax. No acute osseous abnormality. Hardware consistent with prior ORIF of left clavicle and left humerus fractures remains in place. IMPRESSION: 1. Increasing patchy airspace opacities in the periphery of the right mid lung concerning for new atelectasis or pneumonia. 2. Stable and satisfactory position of right basilar pigtail thoracostomy tube. 3. Persistent advanced airspace opacities throughout the left mid and upper lung consistent with known multifocal pneumonia. Electronically Signed   By: Jacqulynn Cadet M.D.   On: 02/08/2021 08:14   CT CHEST WO CONTRAST  Result Date: 02/05/2021 CLINICAL DATA:  Empyema, pneumonia EXAM: CT CHEST WITHOUT CONTRAST TECHNIQUE: Multidetector CT imaging of the chest was performed following the standard protocol without IV contrast. COMPARISON:  02/02/2021 FINDINGS: Cardiovascular: Scattered coronary artery calcifications are seen. Evaluation is limited without intravenous contrast. Mediastinum/Nodes: No new significant lymphadenopathy seen. Evaluation is limited without intravenous contrast. Lungs/Pleura: There is interval decrease in loculated effusion in the right mid lung fields. There is interval removal of right chest tube. There are few tiny pockets of air in the anterolateral aspect of the pleural space in right mid lung fields. There are pockets of air in the previously removed right chest tube track in the chest wall. Large ground-glass and alveolar infiltrates are seen in the left lung. There is interval worsening of ground-glass infiltrate in left upper lobe. There is decrease in the infiltrates in left lower lobe. There are patchy ground-glass and infiltrates in right lung, with no significant interval change. There is interval decrease in amount of loculated effusion in the periphery of right upper lung fields. There is moderate to large right pleural effusion in the periphery of right lower lung fields. There are patchy infiltrates in right lower  lobe. Upper Abdomen: Unremarkable Musculoskeletal: Unremarkable IMPRESSION: There is interval decrease in loculated effusion in the periphery of right lung. There is interval removal of right chest tube. Moderate to large right pleural effusion is seen. Small left pleural effusion is seen. There is interval worsening of ground-glass infiltrates in the left upper lobe. There is decrease in infiltrates in the left lower lobe. Infiltrates in the right lung have not changed significantly. There is 1.3 cm nodular density in the left lower lobe with spiculated margins. This may be part of resolving pneumonia or underlying parenchymal nodule such as neoplasm. Repeat CT after medical management of pneumonia should be considered. Electronically Signed   By: Elmer Picker M.D.   On: 02/05/2021 13:16   CT CHEST W CONTRAST  Result Date: 02/02/2021 CLINICAL DATA:  50 year old female with history of pleural effusion. EXAM: CT CHEST WITH CONTRAST TECHNIQUE: Multidetector CT imaging of the chest was performed during intravenous contrast administration. CONTRAST:  37m OMNIPAQUE IOHEXOL 300 MG/ML  SOLN COMPARISON:  Chest CT 01/30/2021. FINDINGS: Comment: Today's study is limited by  considerable patient respiratory motion. Cardiovascular: Heart size is normal. There is no significant pericardial fluid, thickening or pericardial calcification. There is aortic atherosclerosis, as well as atherosclerosis of the great vessels of the mediastinum and the coronary arteries, including calcified atherosclerotic plaque in the left anterior descending coronary artery. Mediastinum/Nodes: No pathologically enlarged mediastinal or hilar lymph nodes. Esophagus is unremarkable in appearance. No axillary lymphadenopathy. Lungs/Pleura: Right-sided chest tube in position in the medial aspect of the mid right hemithorax. Loculated right pleural effusion with diffuse pleural thickening and enhancement, suggestive of empyema. Small amount of gas  in the right pleural space could be iatrogenic related to indwelling chest tube, or could be related to infection with gas-forming organisms. Trace left pleural effusion predominantly lying dependently. Multifocal airspace consolidation and patchy ground-glass attenuation noted in the lungs bilaterally (left greater than right), with substantially worsened aeration when compared to the recent prior study. Upper Abdomen: Unremarkable. Musculoskeletal: There are no aggressive appearing lytic or blastic lesions noted in the visualized portions of the skeleton. Orthopedic fixation hardware in the left clavicle. Old healed fracture and posttraumatic deformity of the left scapula. IMPRESSION: 1. Markedly worsened multilobar bilateral pneumonia, most severe in the left upper and lower lobes. 2. Interval placement of right-sided chest tube within what appears to be a large right empyema, as above. 3. Aortic atherosclerosis, in addition to left anterior descending coronary artery disease. Please note that although the presence of coronary artery calcium documents the presence of coronary artery disease, the severity of this disease and any potential stenosis cannot be assessed on this non-gated CT examination. Assessment for potential risk factor modification, dietary therapy or pharmacologic therapy may be warranted, if clinically indicated. Aortic Atherosclerosis (ICD10-I70.0). Electronically Signed   By: Vinnie Langton M.D.   On: 02/02/2021 12:10   CT Chest W Contrast  Result Date: 01/30/2021 CLINICAL DATA:  Multiple pulmonary nodules suspicious for pulmonary metastatic disease. EXAM: CT CHEST WITH CONTRAST TECHNIQUE: Multidetector CT imaging of the chest was performed during intravenous contrast administration. CONTRAST:  50m OMNIPAQUE IOHEXOL 350 MG/ML SOLN COMPARISON:  CT abdomen pelvis 01/20/2021 FINDINGS: Cardiovascular: Extensive multi-vessel coronary artery calcification. Global cardiac size within normal  limits. Small pericardial effusion is present, enlarged since prior examination. The central pulmonary arteries are of normal caliber. Mild atherosclerotic calcification within the thoracic aorta. No aortic aneurysm. Mediastinum/Nodes: Large right pleural effusion results in new, mild mediastinal shift to the left. No pathologic thoracic adenopathy. The esophagus is unremarkable. Visualized thyroid is unremarkable. Lungs/Pleura: Since the prior examination, a large, partially loculated right pleural effusion has developed with near complete collapse of the right middle and lower lobes and compressive atelectasis of the right upper lobe. There is superimposed patchy multifocal pulmonary ground-glass infiltrate and peripheral consolidation within the aerated lungs which appears progressive since prior examination within the visualized lung bases. Previously noted pulmonary nodules appear to have decreased in the interval, with the index lesion within the left lung base previously measuring 22 x 25 mm now measuring 14 mm x 20 mm. This suggests an evolving infectious or inflammatory process. Mild superimposed centrilobular emphysema. No pneumothorax. No central obstructing mass., Upper Abdomen: Moderate stool within the visualized splenic flexure. No acute abnormality. Musculoskeletal: No acute bone abnormality. No lytic or blastic bone lesions. Multiple healed bilateral rib fractures, bilateral clavicle fractures, and left scapular fracture are noted. IMPRESSION: Interval development of a large partially loculated right pleural effusion with near complete collapse of the right lung base in atelectasis of the right upper  lobe and resultant mild mediastinal shift to the left. Evolving infectious or inflammatory process within the aerated lungs with interval decrease in size of focal pulmonary nodules noted on prior examination, but progressive peripheral consolidation and ground-glass pulmonary infiltrate demonstrating a  basilar predominance. Differential considerations are led by acute infection though pulmonary vasculitides or drug reaction could appear similarly. Diagnostic and therapeutic thoracentesis of the large right pleural effusion may be helpful for further management. Extensive multi-vessel coronary artery calcification. Mild emphysema Aortic Atherosclerosis (ICD10-I70.0) and Emphysema (ICD10-J43.9). Electronically Signed   By: Fidela Salisbury M.D.   On: 01/30/2021 04:16   CT Abdomen Pelvis W Contrast  Result Date: 01/21/2021 CLINICAL DATA:  Right lower quadrant mass. Chronic hepatitis B. EXAM: CT ABDOMEN AND PELVIS WITH CONTRAST TECHNIQUE: Multidetector CT imaging of the abdomen and pelvis was performed using the standard protocol following bolus administration of intravenous contrast. CONTRAST:  125m OMNIPAQUE IOHEXOL 300 MG/ML  SOLN COMPARISON:  None. FINDINGS: Lower Chest: Multiple pulmonary nodules are seen in both lung bases, measuring up to 3 cm. These are highly suspicious for pulmonary metastases. Tiny right pleural effusion also noted. Hepatobiliary: No hepatic masses identified. Increased attenuation in the gallbladder may be due to sludge or gallstones. No evidence of cholecystitis or biliary ductal dilatation. Pancreas:  No mass or inflammatory changes. Spleen: Within normal limits in size and appearance. Adrenals/Urinary Tract: No masses identified. No evidence of ureteral calculi or hydronephrosis. Stomach/Bowel: Large stool burden is noted throughout the colon. No masses identified. No evidence of obstruction, inflammatory process or abnormal fluid collections. Vascular/Lymphatic: No pathologically enlarged lymph nodes. No acute vascular findings. Reproductive: IUD noted. A cystic lesion is noted in the right adnexal region which measures 3.1 x 1.9 cm on image 79/2. This appears to show some internal areas of contrast enhancement, and cystic ovarian neoplasm cannot be excluded. No evidence of  ascites. Other:  None. Musculoskeletal:  No suspicious bone lesions identified. IMPRESSION: Multiple pulmonary nodules in both lung bases, highly suspicious for pulmonary metastases. Consider chest CT with contrast or PET-CT for further evaluation. 3.1 cm indeterminate cystic lesion in right adnexal region. Cystic ovarian neoplasm cannot be excluded. Pelvic MRI without and with contrast is recommended for further characterization. No evidence of abdominal or pelvic metastatic disease. Marked diffuse colonic distension by stool. Gallbladder sludge or gallstones. No radiographic evidence of cholecystitis or biliary ductal dilatation. These results will be called to the ordering clinician or representative by the Radiologist Assistant, and communication documented in the PACS or [employer] Electronically Signed   By: JMarlaine HindM.D.   On: 01/21/2021 16:04   DG CHEST PORT 1 VIEW  Result Date: 02/13/2021 CLINICAL DATA:  Encounter for chest tube removal EXAM: PORTABLE CHEST 1 VIEW COMPARISON:  February 12, 2021 FINDINGS: No pneumothorax is identified. Patchy consolidation of the left upper lobe is slightly improved. There is minimal right pleural effusion. Nodularity of the right upper lobe is unchanged. Emphysematous changes of both lungs are identified. The mediastinal contour and cardiac silhouette are stable. Bony structures are stable. IMPRESSION: No pneumothorax. Patchy consolidation of the left upper lobe is slightly improved. Nodularity of the right upper lobe is unchanged. Electronically Signed   By: WAbelardo DieselM.D.   On: 02/13/2021 08:30   DG CHEST PORT 1 VIEW  Result Date: 02/12/2021 CLINICAL DATA:  Pleural effusion, difficulty breathing EXAM: PORTABLE CHEST 1 VIEW COMPARISON:  Previous studies including the examination of 02/11/2021 FINDINGS: Heart is within normal limits. Low position of diaphragms  suggests COPD. There is interval removal of right chest tube. Small right pleural effusion  is seen. There is blunting of left lateral CP angle. There is no pneumothorax. There are patchy infiltrates in the parahilar regions, more so on the left side. Increased markings are seen in both lower lung fields with no significant interval change. IMPRESSION: There is interval removal of right chest tube. Small bilateral pleural effusions. There is no pneumothorax. COPD. There are patchy infiltrates in both lungs suggesting pneumonia. Part of this finding may suggest underlying scarring. Electronically Signed   By: Elmer Picker M.D.   On: 02/12/2021 10:06   DG Chest Port 1 View  Result Date: 02/12/2021 CLINICAL DATA:  The right pleural effusion with chest tube in place EXAM: PORTABLE CHEST 1 VIEW COMPARISON:  Prior chest x-ray 02/11/2021 FINDINGS: Pigtail thoracostomy tube remains in good position overlying the right lung base. Slight interval improvement in the appearance of the chest with decreasing patchy airspace opacities throughout the left mid lung and scattered throughout the right lung. Stable pleural thickening along the margin of the right lower lobe. Cardiac and mediastinal contours are unchanged. No acute osseous abnormality. Postsurgical changes of the clavicle again noted. IMPRESSION: 1. Slight interval improvement in aeration with decreasing patchy airspace opacities bilaterally. 2. Stable position of pigtail thoracostomy tube. 3. No new or acute abnormality identified. Electronically Signed   By: Jacqulynn Cadet M.D.   On: 02/12/2021 06:34   DG CHEST PORT 1 VIEW  Result Date: 02/11/2021 CLINICAL DATA:  Chest tube for empyema treatment EXAM: PORTABLE CHEST 1 VIEW COMPARISON:  02/09/2021 FINDINGS: Heart and mediastinal shadows are unchanged. Widespread patchy pulmonary opacities persist, most pronounced in the left midlung. Right pleural catheter remains in place. There is only a small amount of regional right pleural density, unchanged. IMPRESSION: No significant change.  Widespread patchy pulmonary densities. Right pleural catheter in place with a small amount of pleural density, unchanged. Electronically Signed   By: Nelson Chimes M.D.   On: 02/11/2021 08:19   DG CHEST PORT 1 VIEW  Result Date: 02/09/2021 CLINICAL DATA:  Pneumonia. Additional history provided: Pneumonia, chest tube. EXAM: PORTABLE CHEST 1 VIEW COMPARISON:  Prior chest radiographs 02/08/2021 and earlier. FINDINGS: Unchanged position of a right basilar chest tube. Unchanged small right pleural effusion. Persistent ill-defined opacity within the right mid lung, which may reflect atelectasis or pneumonia. Redemonstrated airspace opacity within the left upper to mid lung field, similar to slightly improved, and compatible with pneumonia. No sizable left pleural effusion. No evidence of pneumothorax. No acute bony abnormality identified. Prior ORIF of the left clavicle. IMPRESSION: Airspace disease within the left upper-to-mid lung field, similar to slightly improved as compared to the prior chest radiograph of 02/08/2021. Findings are compatible with the provided history of pneumonia. Similar appearance of ill-defined opacity within the right mid-lung, which may reflect atelectasis or pneumonia. Stable position of a right basilar chest tube with unchanged small right pleural effusion. No evidence of pneumothorax. Electronically Signed   By: Kellie Simmering D.O.   On: 02/09/2021 10:37   DG Chest Port 1 View  Result Date: 02/07/2021 CLINICAL DATA:  Empyema EXAM: PORTABLE CHEST 1 VIEW COMPARISON:  Chest radiograph from one day prior. FINDINGS: Stable right basilar pigtail chest tube. Fixation hardware in the left clavicle. Stable cardiomediastinal silhouette with normal heart size. No pneumothorax. Stable basilar right pleural thickening/small effusion. Stable basilar left pleural thickening/small effusion. Patchy opacities throughout the left greater than right lungs, slightly improved. IMPRESSION: 1.  No pneumothorax.  Stable right basilar pigtail chest tube. 2. Stable bilateral basilar pleural thickening/small effusions. 3. Patchy opacities in the left greater than right lungs, slightly improved. Electronically Signed   By: Ilona Sorrel M.D.   On: 02/07/2021 07:16   DG Chest Port 1 View  Result Date: 02/06/2021 CLINICAL DATA:  50 year old female with pulmonary disease, history of tuberculosis EXAM: PORTABLE CHEST 1 VIEW COMPARISON:  Plain film 11/17/2014, CT chest 01/30/2021, plain film 02/01/2021-02/05/2021 FINDINGS: Cardiomediastinal silhouette likely unchanged in size and contour, with the heart borders partially obscured by overlying lung/pleural disease. Interval placement of pigtail drainage catheter/pleural catheter on the right with decreased opacity at the right lung base. Pleuroparenchymal thickening persists with partial obscuration of the right hemidiaphragm persisting. Minor fissure thickening persists. Multifocal mixed interstitial and airspace disease, left greater than right, persisting and essentially unchanged. No pneumothorax. Surgical changes of left clavicle again noted. IMPRESSION: Interval placement of pigtail drainage catheter in the inferior right pleural space, with decreased pleural fluid and no complicating features. Essentially unchanged multifocal left greater than right interstitial and airspace opacity. Electronically Signed   By: Corrie Mckusick D.O.   On: 02/06/2021 12:00   DG Chest Port 1 View  Result Date: 02/06/2021 CLINICAL DATA:  Short of breath abdominal pain EXAM: PORTABLE CHEST 1 VIEW COMPARISON:  02/04/2021 FINDINGS: Extensive airspace disease throughout the left upper lobe and left lower lobe appears similar. Right lower lobe airspace disease and right pleural effusion with mild progression. Chronic left rib fractures. ORIF left clavicle fracture which is chronic. IMPRESSION: Bilateral airspace disease left greater than right. No change on the left. Mild progression of right lower  lobe infiltrate and right effusion. Electronically Signed   By: Franchot Gallo M.D.   On: 02/06/2021 11:22   DG CHEST PORT 1 VIEW  Result Date: 02/04/2021 CLINICAL DATA:  Follow-up chest tube.  Confusion. EXAM: PORTABLE CHEST 1 VIEW COMPARISON:  02/03/2021 FINDINGS: Right chest tube remains in place. No pneumothorax. Skin fold again demonstrated. Small amount of right pleural fluid persists. Persistent pneumonia more extensive in the left lung than the right, with slightly improved aeration. No worsening or new finding. IMPRESSION: No visible pneumothorax on the right in the frontal projection. Small amount of pleural fluid persists. Bilateral pulmonary infiltrates more extensive on the left than the right, but with slightly improved aeration since yesterday. Electronically Signed   By: Nelson Chimes M.D.   On: 02/04/2021 08:34   DG CHEST PORT 1 VIEW  Result Date: 02/03/2021 CLINICAL DATA:  Chest 2 Emphysema Chest and abdominal pain EXAM: PORTABLE CHEST 1 VIEW COMPARISON:  Of 04/14/2020 FINDINGS: Significant interval decrease of right pleural effusion. Right chest tube is present. There is a linear at edge seen in the right superolateral lung which is favored to be a skin fold rather than pneumothorax given that lung markings are seen beyond this region. Dense airspace opacity in the left lung is similar to prior examination. Fixation plate seen in the left clavicle. IMPRESSION: 1. Significant interval decrease of right pleural effusion with chest tube in place. Linear edge is seen in the superolateral right upper lung is likely a skin fold given the lung markings are seen beyond this region. 2. Persistent dense airspace opacity of the left lung. Electronically Signed   By: Miachel Roux M.D.   On: 02/03/2021 08:21   DG CHEST PORT 1 VIEW  Result Date: 02/02/2021 CLINICAL DATA:  Chest tube evaluation EXAM: PORTABLE CHEST 1 VIEW COMPARISON:  Chest  x-ray earlier the same day FINDINGS: Interval placement of a  right-sided chest tube with the tip in the medial midlung zone. Mildly decreased size of a large loculated right-sided pleural effusion, with associated atelectasis/infiltrate. Extensive airspace opacities again seen throughout the left lung mostly in the mid lung zone, unchanged. No pneumothorax visualized. IMPRESSION: 1. Right-sided chest tube in place. Mildly decreased size of a right-sided loculated pleural effusion with associated atelectasis/infiltrate. 2. No significant change in the left lung. Electronically Signed   By: Ofilia Neas M.D.   On: 02/02/2021 08:24   DG Chest Port 1 View  Result Date: 02/02/2021 CLINICAL DATA:  Hypoxia, shortness of breath EXAM: PORTABLE CHEST 1 VIEW COMPARISON:  02/01/2021 FINDINGS: Large loculated right pleural effusion. Multifocal patchy opacities in the left mid lung and right lower lobe, suggesting multifocal pneumonia, similar. No pneumothorax. The heart is normal in size. Status post ORIF of the left clavicle. IMPRESSION: Multifocal pneumonia, similar. Large loculated right pleural effusion, unchanged. Electronically Signed   By: Julian Hy M.D.   On: 02/02/2021 03:41   DG CHEST PORT 1 VIEW  Result Date: 02/01/2021 CLINICAL DATA:  Dyspnea. EXAM: PORTABLE CHEST 1 VIEW COMPARISON:  CT 01/30/2021 FINDINGS: Similar opacification of right hemithorax corresponding to large loculated pleural effusion and basilar airspace disease. Progress of patchy opacities throughout the left hemithorax. There may be a small left pleural effusion, new. Left basilar pulmonary nodules are not well seen by radiograph. The heart size is normal, but obscured by adjacent pulmonary opacities. Remote traumatic injury to the left hemithorax. IMPRESSION: 1. Similar opacification of the right hemithorax corresponding to large loculated pleural effusion and basilar airspace disease on CT. 2. Progression of patchy opacities throughout the left hemithorax,, which may be multifocal  infection or pulmonary edema. Possible small left pleural effusion, new. Electronically Signed   By: Keith Rake M.D.   On: 02/01/2021 21:23   DG ESOPHAGUS W SINGLE CM (SOL OR THIN BA)  Result Date: 02/08/2021 CLINICAL DATA:  Difficulty swallowing food. EXAM: ESOPHAGUS/BARIUM SWALLOW/TABLET STUDY TECHNIQUE: Single contrast examination was performed using thin liquid barium. This exam was performed by Rushie Nyhan, and was supervised and interpreted by Lorin Picket, MD. FLUOROSCOPY TIME:  Radiation Exposure Index (as provided by the fluoroscopic device): If the device does not provide the exposure index: Fluoroscopy Time:  2 minutes 12 seconds. Number of Acquired Images:  None. COMPARISON:  NONE. FINDINGS: Swallowing: Appears normal. No vestibular penetration or aspiration seen. Pharynx: Unremarkable. Esophagus: There may be mild esophageal fold thickening. Esophageal motility: Within normal limits. Hiatal Hernia: None. Gastroesophageal reflux: None. Ingested 69m barium tablet: A 13 mm barium tablet would not pass beyond the gastroesophageal junction, suggesting very minimal narrowing. No high-grade stricture. Other: None. IMPRESSION: 1. A 13 mm barium tablet would not pass beyond the gastroesophageal junction, suggesting very minimal narrowing. No high-grade stricture. 2. Probable mild esophageal fold thickening. Electronically Signed   By: MLorin PicketM.D.   On: 02/08/2021 10:24       The results of significant diagnostics from this hospitalization (including imaging, microbiology, ancillary and laboratory) are listed below for reference.     Microbiology: Recent Results (from the past 240 hour(s))  SARS CORONAVIRUS 2 (TAT 6-24 HRS) Nasopharyngeal Nasopharyngeal Swab     Status: None   Collection Time: 02/14/21  7:40 PM   Specimen: Nasopharyngeal Swab  Result Value Ref Range Status   SARS Coronavirus 2 NEGATIVE NEGATIVE Final    Comment: (NOTE) SARS-CoV-2 target nucleic acids  are NOT DETECTED.  The SARS-CoV-2 RNA is generally detectable in upper and lower respiratory specimens during the acute phase of infection. Negative results do not preclude SARS-CoV-2 infection, do not rule out co-infections with other pathogens, and should not be used as the sole basis for treatment or other patient management decisions. Negative results must be combined with clinical observations, patient history, and epidemiological information. The expected result is Negative.  Fact Sheet for Patients: SugarRoll.be  Fact Sheet for Healthcare Providers: https://www.woods-mathews.com/  This test is not yet approved or cleared by the [place] FDA and  has been authorized for detection and/or diagnosis of SARS-CoV-2 by FDA under an Emergency Use Authorization (EUA). This EUA will remain  in effect (meaning this test can be used) for the duration of the COVID-19 declaration under Se ction 564(b)(1) of the Act, 21 U.S.C. section 360bbb-3(b)(1), unless the authorization is terminated or revoked sooner.  Performed at [facility], [address] [place], [place] [address]      Labs:  CBC: Recent Labs  Lab 02/12/21 0102 02/13/21 0140 02/15/21 0114 02/18/21 0129  WBC 18.3* 18.8* 14.3* 11.8*  NEUTROABS  --   --  9.2*  --   HGB 7.6* 7.8* 8.4* 8.5*  HCT 25.2* 26.0* 27.3* 28.7*  MCV 88.1 87.5 87.5 88.0  PLT 517* 526* 543* 520*   BMP &GFR Recent Labs  Lab 02/12/21 0102 02/18/21 0129  NA 134* 133*  K 3.8 3.7  CL 98 98  CO2 29 27  GLUCOSE 133* 113*  BUN 10 11  CREATININE 0.48 0.65  CALCIUM 8.4* 8.8*  MG 2.1 2.2  PHOS 3.6 4.4   Estimated Creatinine Clearance: 65.2 mL/min (by C-G formula based on SCr of 0.65 mg/dL). Liver & Pancreas: Recent Labs  Lab 02/12/21 0102 02/18/21 0129  ALBUMIN 1.9* 2.3*   No results for input(s): LIPASE, AMYLASE in the last 168 hours. No results for input(s): AMMONIA in the last 168  hours. Diabetic: No results for input(s): HGBA1C in the last 72 hours. No results for input(s): GLUCAP in the last 168 hours. Cardiac Enzymes: No results for input(s): CKTOTAL, CKMB, CKMBINDEX, TROPONINI in the last 168 hours. No results for input(s): PROBNP in the last 8760 hours. Coagulation Profile: No results for input(s): INR, PROTIME in the last 168 hours. Thyroid Function Tests: No results for input(s): TSH, T4TOTAL, FREET4, T3FREE, THYROIDAB in the last 72 hours. Lipid Profile: No results for input(s): CHOL, HDL, LDLCALC, TRIG, CHOLHDL, LDLDIRECT in the last 72 hours. Anemia Panel: No results for input(s): VITAMINB12, FOLATE, FERRITIN, TIBC, IRON, RETICCTPCT in the last 72 hours. Urine analysis:    Component Value Date/Time   COLORURINE YELLOW 01/30/2021 0504   APPEARANCEUR CLEAR 01/30/2021 0504   APPEARANCEUR Clear 06/30/2016 1013   LABSPEC 1.043 (H) 01/30/2021 0504   PHURINE 6.0 01/30/2021 0504   GLUCOSEU NEGATIVE 01/30/2021 0504   HGBUR NEGATIVE 01/30/2021 0504   BILIRUBINUR NEGATIVE 01/30/2021 0504   BILIRUBINUR Negative 06/30/2016 [place] 01/30/2021 0504   PROTEINUR NEGATIVE 01/30/2021 0504   UROBILINOGEN 0.2 04/13/2011 0330   NITRITE POSITIVE (A) 01/30/2021 0504   LEUKOCYTESUR NEGATIVE 01/30/2021 0504   Sepsis Labs: Invalid input(s): PROCALCITONIN, LACTICIDVEN   Time coordinating discharge: 45 minutes  SIGNED:  Mercy Riding, MD  [facility] 02/18/2021, 10:53 AM

## 2021-02-18 NOTE — Progress Notes (Signed)
Mobility Specialist Progress Note:   02/18/21 1030  Mobility  Activity Ambulated in hall  Level of Assistance Modified independent, requires aide device or extra time  Assistive Device Other (Comment) (HHA)  Distance Ambulated (ft) 1100 ft  Mobility Ambulated with assistance in hallway  Mobility Response Tolerated well  Mobility performed by Mobility specialist  $Mobility charge 1 Mobility   Pt asx during ambulation. Handheld assist for comfort, per usual.  Nelta Numbers Mobility Specialist  Phone [phone]

## 2021-02-19 ENCOUNTER — Telehealth: Payer: Self-pay

## 2021-02-19 NOTE — Telephone Encounter (Signed)
Spoke with Page at [facility]. She is unable to assist in scheduling an appointment for Margaret [name]. She instructed to call back after 4:30 today or on Monday. Informed her our office will call Monday to arrange the appointment.

## 2021-02-19 NOTE — Telephone Encounter (Signed)
Called to setup new patient appointment for [name]. Instructed to call [facility] to arrange this at [phone].

## 2021-02-22 DIAGNOSIS — F250 Schizoaffective disorder, bipolar type: Secondary | ICD-10-CM | POA: Diagnosis not present

## 2021-02-24 ENCOUNTER — Ambulatory Visit (INDEPENDENT_AMBULATORY_CARE_PROVIDER_SITE_OTHER): Payer: Medicare Other | Admitting: Primary Care

## 2021-02-24 ENCOUNTER — Ambulatory Visit (INDEPENDENT_AMBULATORY_CARE_PROVIDER_SITE_OTHER): Payer: Medicare Other

## 2021-02-24 ENCOUNTER — Other Ambulatory Visit: Payer: Self-pay

## 2021-02-24 ENCOUNTER — Inpatient Hospital Stay: Payer: Medicare Other | Admitting: Primary Care

## 2021-02-24 ENCOUNTER — Encounter: Payer: Self-pay | Admitting: Primary Care

## 2021-02-24 ENCOUNTER — Telehealth: Payer: Self-pay

## 2021-02-24 VITALS — BP 122/82 | HR 106 | Temp 97.9°F | Ht 64.0 in | Wt 98.8 lb

## 2021-02-24 DIAGNOSIS — J90 Pleural effusion, not elsewhere classified: Secondary | ICD-10-CM | POA: Diagnosis not present

## 2021-02-24 DIAGNOSIS — E278 Other specified disorders of adrenal gland: Secondary | ICD-10-CM | POA: Insufficient documentation

## 2021-02-24 DIAGNOSIS — R630 Anorexia: Secondary | ICD-10-CM | POA: Insufficient documentation

## 2021-02-24 DIAGNOSIS — N83299 Other ovarian cyst, unspecified side: Secondary | ICD-10-CM | POA: Diagnosis not present

## 2021-02-24 DIAGNOSIS — J189 Pneumonia, unspecified organism: Secondary | ICD-10-CM | POA: Diagnosis not present

## 2021-02-24 DIAGNOSIS — J439 Emphysema, unspecified: Secondary | ICD-10-CM | POA: Diagnosis not present

## 2021-02-24 LAB — CBC WITH DIFFERENTIAL/PLATELET
Absolute Monocytes: 923 cells/uL (ref 200–950)
Basophils Absolute: 142 cells/uL (ref 0–200)
Basophils Relative: 1 %
Eosinophils Absolute: 57 cells/uL (ref 15–500)
Eosinophils Relative: 0.4 %
HCT: 31.9 % — ABNORMAL LOW (ref 35.0–45.0)
Hemoglobin: 10.2 g/dL — ABNORMAL LOW (ref 11.7–15.5)
Lymphs Abs: 3010 cells/uL (ref 850–3900)
MCH: 27.2 pg (ref 27.0–33.0)
MCHC: 32 g/dL (ref 32.0–36.0)
MCV: 85.1 fL (ref 80.0–100.0)
MPV: 9.1 fL (ref 7.5–12.5)
Monocytes Relative: 6.5 %
Neutro Abs: 10068 cells/uL — ABNORMAL HIGH (ref 1500–7800)
Neutrophils Relative %: 70.9 %
Platelets: 444 10*3/uL — ABNORMAL HIGH (ref 140–400)
RBC: 3.75 10*6/uL — ABNORMAL LOW (ref 3.80–5.10)
RDW: 15.1 % — ABNORMAL HIGH (ref 11.0–15.0)
Total Lymphocyte: 21.2 %
WBC: 14.2 10*3/uL — ABNORMAL HIGH (ref 3.8–10.8)

## 2021-02-24 IMAGING — DX DG CHEST 2V
2 series · 2 of 2 positions shown · non-contrast
Comparison: [DATE].

CLINICAL DATA: FU pleural effusion, asp pneumonia

EXAM:
CHEST - 2 VIEW

[chest pa]
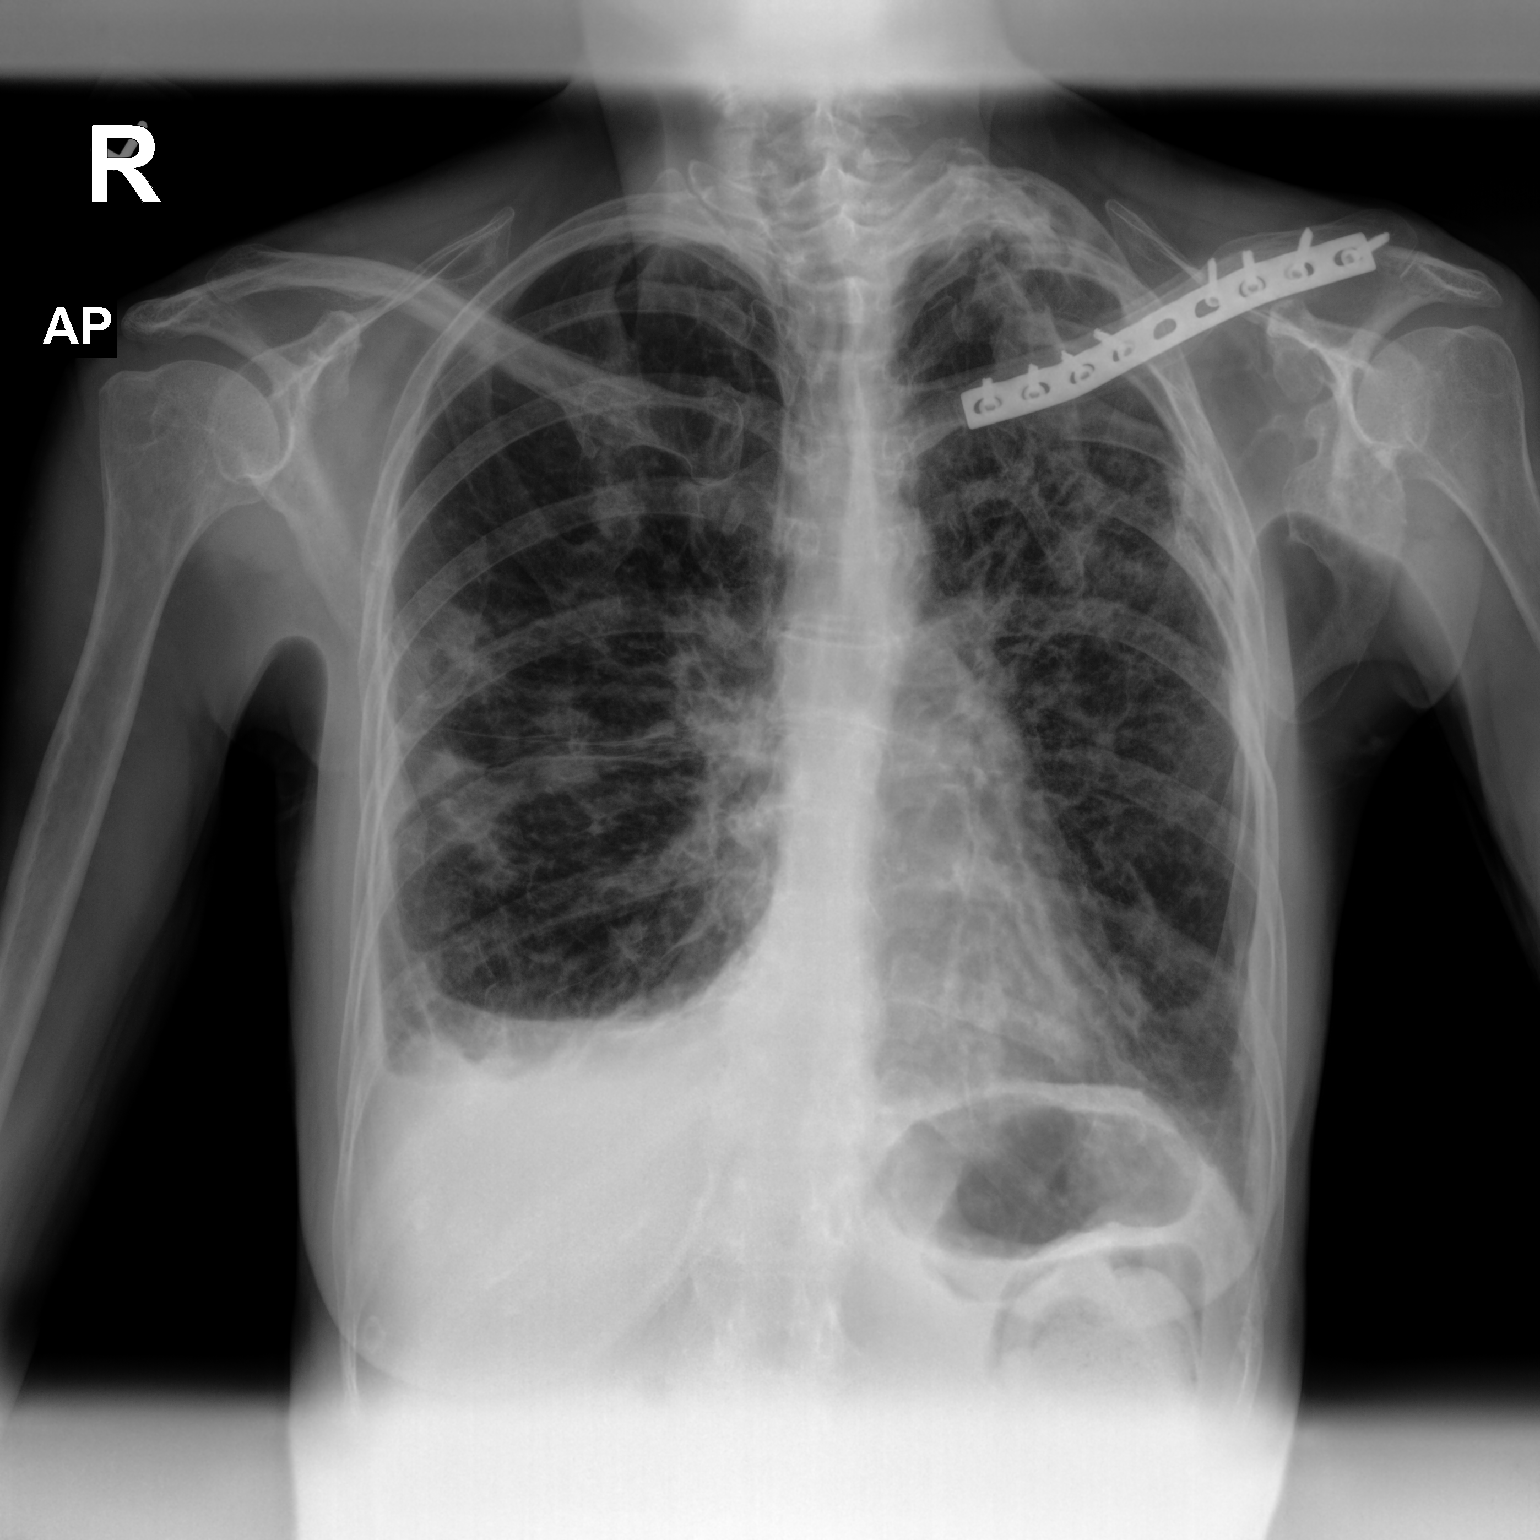

[chest lat]
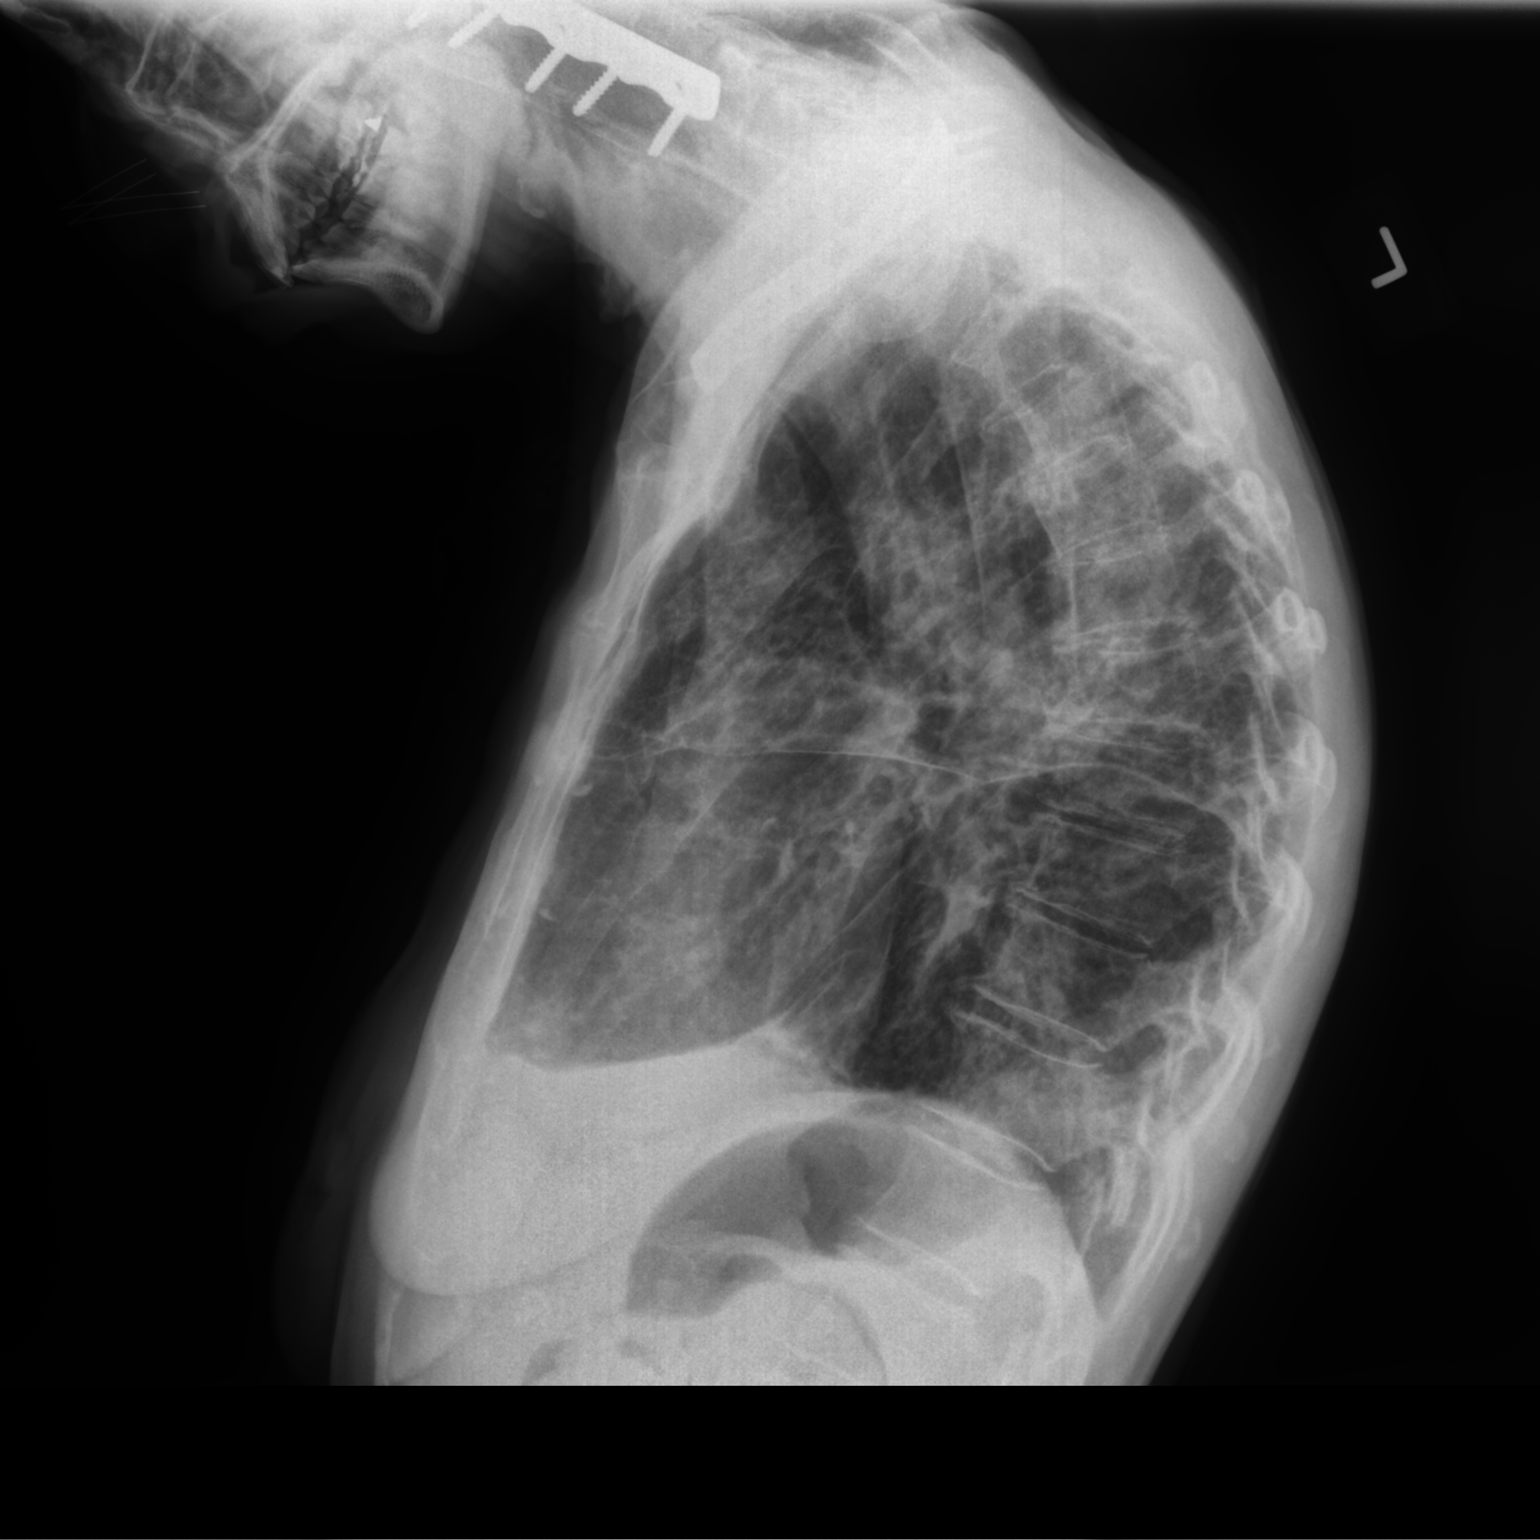

[2 of 2 positions shown; findings below may reference images not displayed]

FINDINGS: New patchy airspace opacities in the peripheral right midlung.
Similar patchy opacities in the left lung. Small right pleural
effusion. Emphysema. Cardiomediastinal silhouette is within normal
limits and similar. Left clavicular fixation.
IMPRESSION: 1. New patchy airspace opacities in the peripheral right midlung and
similar opacities in the left lung, concerning for multifocal
pneumonia. Recommend follow-up imaging to resolution.
2. Small right pleural effusion.
3. Emphysema.

## 2021-02-24 MED ORDER — ENSURE COMPLETE PO LIQD: 237.0000 mL | Freq: Two times a day (BID) | ORAL | 3 refills | Status: DC

## 2021-02-24 NOTE — Progress Notes (Signed)
Sent you my note on her. She has two days left of Augmentin. I'm checking sputum culture on her. She looked pretty good in office, she had some chest congestion. Recommend she take mucinex. Shell follow-up with you in 2 week. Will need repeat imaging

## 2021-02-24 NOTE — Assessment & Plan Note (Signed)
-   Elevated CEA 125 - Needs outpatient follow-up with GYN

## 2021-02-24 NOTE — Assessment & Plan Note (Signed)
-   Recommend patient start taking ensure twice daily as supplement

## 2021-02-24 NOTE — Progress Notes (Signed)
@Patient  ID: Margaret [name], female    DOB: 1970/05/25, 50 y.o.   MRN: [id number]  Chief Complaint  Patient presents with   Hospitalization Follow-up    Referring provider: Dettinger, Fransisca Kaufmann, MD  HPI: 50 year old female, never smoked. PMH significant for community acquired pneumonia, centrilobular emphysema, pleural effusion, acute pulmonary edema, respiratory failure, hx childhood tuberculosis, multiple pulmonary nodules, chronic hepatitis B, intellectual disability, schizophrenia.   Patient was admitted from 01/29/21-02/18/21 for recurrent aspiration pneumonia with right empyema. She initially presented to [facility] with progressive cough/CAP. CT showed large loculated right pleural effusion. She was transferred to [facility] and underwent right chest tube placement on 11/1 by PCCM. She had fibrinolytics on 11/1 and 11/2. Chest tube was removed on 11/4. However, she had enlarging residual posterior effusion which she required pigtail catheter from 11/5-11/11 with additional fibrinolytics. Treated with Augmentin for possible aspiration pneumonia with right empyema. PCCM recommend 21 day course abx which will end on 02/26/21.  In addition to above, she has a right adrenal mass and cystic ovarian lesion with elevated CEA 125, needs outpatient oncology follow-up.   02/24/2021- Interim hx  Patient presents today for hospital follow-up. Accompanied by caretaker, lives in facility. She tells me that she is feeling well today. Still has cough with green mucus. No significant shortness of breath, chest tightness or wheezing. Appetite is decreased. SLP recommend regular diet. Needs repeat chest imaging and CBC today.    No Known Allergies  Immunization History  Administered Date(s) Administered   Influenza,inj,Quad PF,6+ Mos 12/30/2015, 12/30/2016, 03/14/2018, 02/20/2019   Influenza-Unspecified 01/12/2015, 01/07/2020   Moderna Covid-19 Vaccine Bivalent Booster 23yr & up 02/15/2021   Moderna  Sars-Covid-2 Vaccination 06/10/2019, 07/09/2019, 02/11/2020    Past Medical History:  Diagnosis Date   Hematuria 11/12/2014   [place]    Hypothyroidism    Impulse disorder    IUD (intrauterine device) in place 11/12/2014   Inserted 12/24/12 at [facility]  Mild intellectual disability    [place]    [place]ia [place]    Tuberculosis    as a child   Urinary frequency 11/12/2014    Tobacco History: Social History   Tobacco Use  Smoking Status Never  Smokeless Tobacco Never   Counseling given: Not Answered   Outpatient Medications Prior to Visit  Medication Sig Dispense Refill   acetaminophen (TYLENOL) 650 MG CR tablet Take 650 mg by mouth every 8 (eight) hours as needed for pain.     albuterol (VENTOLIN HFA) 108 (90 Base) MCG/ACT inhaler INHALE 2 PUFFS EVERY 4 HOURS AS NEEDED FOR WHEEZING. 36 g 2   amoxicillin-clavulanate (AUGMENTIN) 875-125 MG tablet Take 1 tablet by mouth every 12 (twelve) hours for 8 days. 16 tablet 0   benztropine (COGENTIN) 1 MG tablet Take 1 tablet (1 mg total) by mouth 2 (two) times daily. 180 tablet 3   chlorproMAZINE (THORAZINE) 50 MG tablet Take 50 mg by mouth as needed. One tablet by mouth PRN agitation, max of 2 tablets, 100 mg in 24 hours     Cholecalciferol (VITAMIN D3) 50 MCG (2000 UT) capsule TAKE 1 CAPSULE BY MOUTH EVERY MORNING. 30 capsule 11   cloZAPine (CLOZARIL) 100 MG tablet Take 200 mg by mouth at bedtime.     entecavir (BARACLUDE) 0.5 MG tablet TAKE (1) TABLET BY MOUTH ONCE DAILY. 30 tablet 11   Fish Oil-Cholecalciferol (FISH OIL + D3) 1200-1000 MG-UNIT CAPS TAKE 1 CAPSULE BY MOUTH TWICE DAILY. 60 capsule 11  FLUoxetine (PROZAC) 20 MG capsule TAKE 3 CAPSULES BY MOUTH ONCE DAILY. 90 capsule 11   fluticasone (FLONASE) 50 MCG/ACT nasal spray SPRAY 2 SPRAYS IN RIGHT NOSTRIL ONLY TWICE DAILY. 16 g 11   levonorgestrel (MIRENA) 20 MCG/24HR IUD 1 each by Intrauterine route once.     mirtazapine (REMERON) 7.5 MG tablet Take  7.5 mg by mouth at bedtime.     Multiple Vitamins-Minerals (CENTROVITE) TABS TAKE 1 TABLET BY MOUTH AT BEDTIME. 30 tablet 5   polyethylene glycol powder (GLYCOLAX/MIRALAX) 17 GM/SCOOP powder Take 17 g by mouth daily. (Patient taking differently: Take 17 g by mouth daily as needed.) 507 g 11   senna-docusate (SENOKOT-S) 8.6-50 MG tablet Take 1 tablet by mouth 2 (two) times daily between meals as needed for mild constipation. 180 tablet 0   Skin Protectants, Misc. (MINERIN CREME) CREA APPLY MODERATE AMOUNT TOPICALLY TO DRY SKIN ON SOLES & HEELS OF BOTH FEET AFTER SHOWER. (NURSING STAFF TO SUPERVISE APPLICATION) 026 g PRN   vitamin E 200 UNIT capsule Take 1 capsule (200 Units total) by mouth daily. 30 capsule 11   No facility-administered medications prior to visit.    Review of Systems  Review of Systems  Constitutional: Negative.   HENT:  Positive for congestion.   Respiratory:  Positive for cough. Negative for chest tightness, shortness of breath and wheezing.     Physical Exam  BP 122/82 (BP Location: Right Arm, Patient Position: Sitting, Cuff Size: Small)   Pulse (!) 106   Temp 97.9 F (36.6 C) (Oral)   Ht 5' 4"  (1.626 m)   Wt 98 lb 12.8 oz (44.8 kg)   SpO2 95%   BMI 16.96 kg/m  Physical Exam Constitutional:      General: She is not in acute distress.    Appearance: Normal appearance. She is normal weight. She is not ill-appearing.  HENT:     Mouth/Throat:     Mouth: Mucous membranes are moist.     Pharynx: Oropharynx is clear.  Cardiovascular:     Rate and Rhythm: Normal rate and regular rhythm.  Pulmonary:     Effort: Pulmonary effort is normal.     Breath sounds: Rhonchi present.  Musculoskeletal:        General: Normal range of motion.  Skin:    General: Skin is warm and dry.  Neurological:     General: No focal deficit present.     Mental Status: She is alert and oriented to person, place, and time. Mental status is at baseline.  Psychiatric:        Mood and  Affect: Mood normal.        Behavior: Behavior normal.        Thought Content: Thought content normal.        Judgment: Judgment normal.     Lab Results:  CBC    Component Value Date/Time   WBC 11.8 (H) 02/18/2021 0129   RBC 3.26 (L) 02/18/2021 0129   HGB 8.5 (L) 02/18/2021 0129   HGB 11.7 11/30/2020 1049   HCT 28.7 (L) 02/18/2021 0129   HCT 35.7 11/30/2020 1049   PLT 520 (H) 02/18/2021 0129   PLT 257 11/30/2020 1049   MCV 88.0 02/18/2021 0129   MCV 90 11/30/2020 1049   MCH 26.1 02/18/2021 0129   MCHC 29.6 (L) 02/18/2021 0129   RDW 15.7 (H) 02/18/2021 0129   RDW 11.2 (L) 11/30/2020 1049   LYMPHSABS 3.7 02/15/2021 0114   LYMPHSABS 1.5 11/30/2020 1049  MONOABS 1.1 (H) 02/15/2021 0114   EOSABS 0.2 02/15/2021 0114   EOSABS 0.1 11/30/2020 1049   BASOSABS 0.1 02/15/2021 0114   BASOSABS 0.1 11/30/2020 1049    BMET    Component Value Date/Time   NA 133 (L) 02/18/2021 0129   NA 144 11/30/2020 1049   K 3.7 02/18/2021 0129   CL 98 02/18/2021 0129   CO2 27 02/18/2021 0129   GLUCOSE 113 (H) 02/18/2021 0129   BUN 11 02/18/2021 0129   BUN 9 11/30/2020 1049   CREATININE 0.65 02/18/2021 0129   CREATININE 0.73 06/30/2020 1054   CALCIUM 8.8 (L) 02/18/2021 0129   GFRNONAA >60 02/18/2021 0129   GFRAA 102 01/08/2020 1045    BNP    Component Value Date/Time   BNP 70.2 02/01/2021 2146    ProBNP No results found for: PROBNP  Imaging: DG Chest 1 View  Result Date: 02/08/2021 CLINICAL DATA:  Empyema with chest tube in place EXAM: CHEST  1 VIEW COMPARISON:  Prior chest x-ray 02/07/2021 FINDINGS: Right basilar pigtail thoracostomy tube in unchanged position. Small amount of residual pleural thickening versus pleural fluid persists. Increasing patchy airspace opacities in the periphery of the right lung. Persistent patchy airspace opacities in the left upper and mid lung without significant interval change. No pneumothorax. No acute osseous abnormality. Hardware consistent with  prior ORIF of left clavicle and left humerus fractures remains in place. IMPRESSION: 1. Increasing patchy airspace opacities in the periphery of the right mid lung concerning for new atelectasis or pneumonia. 2. Stable and satisfactory position of right basilar pigtail thoracostomy tube. 3. Persistent advanced airspace opacities throughout the left mid and upper lung consistent with known multifocal pneumonia. Electronically Signed   By: Jacqulynn Cadet M.D.   On: 02/08/2021 08:14   CT CHEST WO CONTRAST  Result Date: 02/05/2021 CLINICAL DATA:  Empyema, pneumonia EXAM: CT CHEST WITHOUT CONTRAST TECHNIQUE: Multidetector CT imaging of the chest was performed following the standard protocol without IV contrast. COMPARISON:  02/02/2021 FINDINGS: Cardiovascular: Scattered coronary artery calcifications are seen. Evaluation is limited without intravenous contrast. Mediastinum/Nodes: No new significant lymphadenopathy seen. Evaluation is limited without intravenous contrast. Lungs/Pleura: There is interval decrease in loculated effusion in the right mid lung fields. There is interval removal of right chest tube. There are few tiny pockets of air in the anterolateral aspect of the pleural space in right mid lung fields. There are pockets of air in the previously removed right chest tube track in the chest wall. Large ground-glass and alveolar infiltrates are seen in the left lung. There is interval worsening of ground-glass infiltrate in left upper lobe. There is decrease in the infiltrates in left lower lobe. There are patchy ground-glass and infiltrates in right lung, with no significant interval change. There is interval decrease in amount of loculated effusion in the periphery of right upper lung fields. There is moderate to large right pleural effusion in the periphery of right lower lung fields. There are patchy infiltrates in right lower lobe. Upper Abdomen: Unremarkable Musculoskeletal: Unremarkable IMPRESSION:  There is interval decrease in loculated effusion in the periphery of right lung. There is interval removal of right chest tube. Moderate to large right pleural effusion is seen. Small left pleural effusion is seen. There is interval worsening of ground-glass infiltrates in the left upper lobe. There is decrease in infiltrates in the left lower lobe. Infiltrates in the right lung have not changed significantly. There is 1.3 cm nodular density in the left lower lobe with  spiculated margins. This may be part of resolving pneumonia or underlying parenchymal nodule such as neoplasm. Repeat CT after medical management of pneumonia should be considered. Electronically Signed   By: Elmer Picker M.D.   On: 02/05/2021 13:16   CT CHEST W CONTRAST  Result Date: 02/02/2021 CLINICAL DATA:  50 year old female with history of pleural effusion. EXAM: CT CHEST WITH CONTRAST TECHNIQUE: Multidetector CT imaging of the chest was performed during intravenous contrast administration. CONTRAST:  39m OMNIPAQUE IOHEXOL 300 MG/ML  SOLN COMPARISON:  Chest CT 01/30/2021. FINDINGS: Comment: Today's study is limited by considerable patient respiratory motion. Cardiovascular: Heart size is normal. There is no significant pericardial fluid, thickening or pericardial calcification. There is aortic atherosclerosis, as well as atherosclerosis of the great vessels of the mediastinum and the coronary arteries, including calcified atherosclerotic plaque in the left anterior descending coronary artery. Mediastinum/Nodes: No pathologically enlarged mediastinal or hilar lymph nodes. Esophagus is unremarkable in appearance. No axillary lymphadenopathy. Lungs/Pleura: Right-sided chest tube in position in the medial aspect of the mid right hemithorax. Loculated right pleural effusion with diffuse pleural thickening and enhancement, suggestive of empyema. Small amount of gas in the right pleural space could be iatrogenic related to indwelling chest  tube, or could be related to infection with gas-forming organisms. Trace left pleural effusion predominantly lying dependently. Multifocal airspace consolidation and patchy ground-glass attenuation noted in the lungs bilaterally (left greater than right), with substantially worsened aeration when compared to the recent prior study. Upper Abdomen: Unremarkable. Musculoskeletal: There are no aggressive appearing lytic or blastic lesions noted in the visualized portions of the skeleton. Orthopedic fixation hardware in the left clavicle. Old healed fracture and posttraumatic deformity of the left scapula. IMPRESSION: 1. Markedly worsened multilobar bilateral pneumonia, most severe in the left upper and lower lobes. 2. Interval placement of right-sided chest tube within what appears to be a large right empyema, as above. 3. Aortic atherosclerosis, in addition to left anterior descending coronary artery disease. Please note that although the presence of coronary artery calcium documents the presence of coronary artery disease, the severity of this disease and any potential stenosis cannot be assessed on this non-gated CT examination. Assessment for potential risk factor modification, dietary therapy or pharmacologic therapy may be warranted, if clinically indicated. Aortic Atherosclerosis (ICD10-I70.0). Electronically Signed   By: DVinnie LangtonM.D.   On: 02/02/2021 12:10   CT Chest W Contrast  Result Date: 01/30/2021 CLINICAL DATA:  Multiple pulmonary nodules suspicious for pulmonary metastatic disease. EXAM: CT CHEST WITH CONTRAST TECHNIQUE: Multidetector CT imaging of the chest was performed during intravenous contrast administration. CONTRAST:  629mOMNIPAQUE IOHEXOL 350 MG/ML SOLN COMPARISON:  CT abdomen pelvis 01/20/2021 FINDINGS: Cardiovascular: Extensive multi-vessel coronary artery calcification. Global cardiac size within normal limits. Small pericardial effusion is present, enlarged since prior  examination. The central pulmonary arteries are of normal caliber. Mild atherosclerotic calcification within the thoracic aorta. No aortic aneurysm. Mediastinum/Nodes: Large right pleural effusion results in new, mild mediastinal shift to the left. No pathologic thoracic adenopathy. The esophagus is unremarkable. Visualized thyroid is unremarkable. Lungs/Pleura: Since the prior examination, a large, partially loculated right pleural effusion has developed with near complete collapse of the right middle and lower lobes and compressive atelectasis of the right upper lobe. There is superimposed patchy multifocal pulmonary ground-glass infiltrate and peripheral consolidation within the aerated lungs which appears progressive since prior examination within the visualized lung bases. Previously noted pulmonary nodules appear to have decreased in the interval, with the index lesion  within the left lung base previously measuring 22 x 25 mm now measuring 14 mm x 20 mm. This suggests an evolving infectious or inflammatory process. Mild superimposed centrilobular emphysema. No pneumothorax. No central obstructing mass., Upper Abdomen: Moderate stool within the visualized splenic flexure. No acute abnormality. Musculoskeletal: No acute bone abnormality. No lytic or blastic bone lesions. Multiple healed bilateral rib fractures, bilateral clavicle fractures, and left scapular fracture are noted. IMPRESSION: Interval development of a large partially loculated right pleural effusion with near complete collapse of the right lung base in atelectasis of the right upper lobe and resultant mild mediastinal shift to the left. Evolving infectious or inflammatory process within the aerated lungs with interval decrease in size of focal pulmonary nodules noted on prior examination, but progressive peripheral consolidation and ground-glass pulmonary infiltrate demonstrating a basilar predominance. Differential considerations are led by acute  infection though pulmonary vasculitides or drug reaction could appear similarly. Diagnostic and therapeutic thoracentesis of the large right pleural effusion may be helpful for further management. Extensive multi-vessel coronary artery calcification. Mild emphysema Aortic Atherosclerosis (ICD10-I70.0) and Emphysema (ICD10-J43.9). Electronically Signed   By: Fidela Salisbury M.D.   On: 01/30/2021 04:16   DG CHEST PORT 1 VIEW  Result Date: 02/13/2021 CLINICAL DATA:  Encounter for chest tube removal EXAM: PORTABLE CHEST 1 VIEW COMPARISON:  February 12, 2021 FINDINGS: No pneumothorax is identified. Patchy consolidation of the left upper lobe is slightly improved. There is minimal right pleural effusion. Nodularity of the right upper lobe is unchanged. Emphysematous changes of both lungs are identified. The mediastinal contour and cardiac silhouette are stable. Bony structures are stable. IMPRESSION: No pneumothorax. Patchy consolidation of the left upper lobe is slightly improved. Nodularity of the right upper lobe is unchanged. Electronically Signed   By: Abelardo Diesel M.D.   On: 02/13/2021 08:30   DG CHEST PORT 1 VIEW  Result Date: 02/12/2021 CLINICAL DATA:  Pleural effusion, difficulty breathing EXAM: PORTABLE CHEST 1 VIEW COMPARISON:  Previous studies including the examination of 02/11/2021 FINDINGS: Heart is within normal limits. Low position of diaphragms suggests COPD. There is interval removal of right chest tube. Small right pleural effusion is seen. There is blunting of left lateral CP angle. There is no pneumothorax. There are patchy infiltrates in the parahilar regions, more so on the left side. Increased markings are seen in both lower lung fields with no significant interval change. IMPRESSION: There is interval removal of right chest tube. Small bilateral pleural effusions. There is no pneumothorax. COPD. There are patchy infiltrates in both lungs suggesting pneumonia. Part of this finding may  suggest underlying scarring. Electronically Signed   By: Elmer Picker M.D.   On: 02/12/2021 10:06   DG Chest Port 1 View  Result Date: 02/12/2021 CLINICAL DATA:  The right pleural effusion with chest tube in place EXAM: PORTABLE CHEST 1 VIEW COMPARISON:  Prior chest x-ray 02/11/2021 FINDINGS: Pigtail thoracostomy tube remains in good position overlying the right lung base. Slight interval improvement in the appearance of the chest with decreasing patchy airspace opacities throughout the left mid lung and scattered throughout the right lung. Stable pleural thickening along the margin of the right lower lobe. Cardiac and mediastinal contours are unchanged. No acute osseous abnormality. Postsurgical changes of the clavicle again noted. IMPRESSION: 1. Slight interval improvement in aeration with decreasing patchy airspace opacities bilaterally. 2. Stable position of pigtail thoracostomy tube. 3. No new or acute abnormality identified. Electronically Signed   By: Dellis Filbert.D.  On: 02/12/2021 06:34   DG CHEST PORT 1 VIEW  Result Date: 02/11/2021 CLINICAL DATA:  Chest tube for empyema treatment EXAM: PORTABLE CHEST 1 VIEW COMPARISON:  02/09/2021 FINDINGS: Heart and mediastinal shadows are unchanged. Widespread patchy pulmonary opacities persist, most pronounced in the left midlung. Right pleural catheter remains in place. There is only a small amount of regional right pleural density, unchanged. IMPRESSION: No significant change. Widespread patchy pulmonary densities. Right pleural catheter in place with a small amount of pleural density, unchanged. Electronically Signed   By: Nelson Chimes M.D.   On: 02/11/2021 08:19   DG CHEST PORT 1 VIEW  Result Date: 02/09/2021 CLINICAL DATA:  Pneumonia. Additional history provided: Pneumonia, chest tube. EXAM: PORTABLE CHEST 1 VIEW COMPARISON:  Prior chest radiographs 02/08/2021 and earlier. FINDINGS: Unchanged position of a right basilar chest tube.  Unchanged small right pleural effusion. Persistent ill-defined opacity within the right mid lung, which may reflect atelectasis or pneumonia. Redemonstrated airspace opacity within the left upper to mid lung field, similar to slightly improved, and compatible with pneumonia. No sizable left pleural effusion. No evidence of pneumothorax. No acute bony abnormality identified. Prior ORIF of the left clavicle. IMPRESSION: Airspace disease within the left upper-to-mid lung field, similar to slightly improved as compared to the prior chest radiograph of 02/08/2021. Findings are compatible with the provided history of pneumonia. Similar appearance of ill-defined opacity within the right mid-lung, which may reflect atelectasis or pneumonia. Stable position of a right basilar chest tube with unchanged small right pleural effusion. No evidence of pneumothorax. Electronically Signed   By: Kellie Simmering D.O.   On: 02/09/2021 10:37   DG Chest Port 1 View  Result Date: 02/07/2021 CLINICAL DATA:  Empyema EXAM: PORTABLE CHEST 1 VIEW COMPARISON:  Chest radiograph from one day prior. FINDINGS: Stable right basilar pigtail chest tube. Fixation hardware in the left clavicle. Stable cardiomediastinal silhouette with normal heart size. No pneumothorax. Stable basilar right pleural thickening/small effusion. Stable basilar left pleural thickening/small effusion. Patchy opacities throughout the left greater than right lungs, slightly improved. IMPRESSION: 1. No pneumothorax. Stable right basilar pigtail chest tube. 2. Stable bilateral basilar pleural thickening/small effusions. 3. Patchy opacities in the left greater than right lungs, slightly improved. Electronically Signed   By: Ilona Sorrel M.D.   On: 02/07/2021 07:16   DG Chest Port 1 View  Result Date: 02/06/2021 CLINICAL DATA:  50 year old female with pulmonary disease, history of tuberculosis EXAM: PORTABLE CHEST 1 VIEW COMPARISON:  Plain film 11/17/2014, CT chest 01/30/2021,  plain film 02/01/2021-02/05/2021 FINDINGS: Cardiomediastinal silhouette likely unchanged in size and contour, with the heart borders partially obscured by overlying lung/pleural disease. Interval placement of pigtail drainage catheter/pleural catheter on the right with decreased opacity at the right lung base. Pleuroparenchymal thickening persists with partial obscuration of the right hemidiaphragm persisting. Minor fissure thickening persists. Multifocal mixed interstitial and airspace disease, left greater than right, persisting and essentially unchanged. No pneumothorax. Surgical changes of left clavicle again noted. IMPRESSION: Interval placement of pigtail drainage catheter in the inferior right pleural space, with decreased pleural fluid and no complicating features. Essentially unchanged multifocal left greater than right interstitial and airspace opacity. Electronically Signed   By: Corrie Mckusick D.O.   On: 02/06/2021 12:00   DG Chest Port 1 View  Result Date: 02/06/2021 CLINICAL DATA:  Short of breath abdominal pain EXAM: PORTABLE CHEST 1 VIEW COMPARISON:  02/04/2021 FINDINGS: Extensive airspace disease throughout the left upper lobe and left lower lobe appears similar. Right  lower lobe airspace disease and right pleural effusion with mild progression. Chronic left rib fractures. ORIF left clavicle fracture which is chronic. IMPRESSION: Bilateral airspace disease left greater than right. No change on the left. Mild progression of right lower lobe infiltrate and right effusion. Electronically Signed   By: Franchot Gallo M.D.   On: 02/06/2021 11:22   DG CHEST PORT 1 VIEW  Result Date: 02/04/2021 CLINICAL DATA:  Follow-up chest tube.  Confusion. EXAM: PORTABLE CHEST 1 VIEW COMPARISON:  02/03/2021 FINDINGS: Right chest tube remains in place. No pneumothorax. Skin fold again demonstrated. Small amount of right pleural fluid persists. Persistent pneumonia more extensive in the left lung than the right,  with slightly improved aeration. No worsening or new finding. IMPRESSION: No visible pneumothorax on the right in the frontal projection. Small amount of pleural fluid persists. Bilateral pulmonary infiltrates more extensive on the left than the right, but with slightly improved aeration since yesterday. Electronically Signed   By: Nelson Chimes M.D.   On: 02/04/2021 08:34   DG CHEST PORT 1 VIEW  Result Date: 02/03/2021 CLINICAL DATA:  Chest 2 Emphysema Chest and abdominal pain EXAM: PORTABLE CHEST 1 VIEW COMPARISON:  Of 04/14/2020 FINDINGS: Significant interval decrease of right pleural effusion. Right chest tube is present. There is a linear at edge seen in the right superolateral lung which is favored to be a skin fold rather than pneumothorax given that lung markings are seen beyond this region. Dense airspace opacity in the left lung is similar to prior examination. Fixation plate seen in the left clavicle. IMPRESSION: 1. Significant interval decrease of right pleural effusion with chest tube in place. Linear edge is seen in the superolateral right upper lung is likely a skin fold given the lung markings are seen beyond this region. 2. Persistent dense airspace opacity of the left lung. Electronically Signed   By: Miachel Roux M.D.   On: 02/03/2021 08:21   DG CHEST PORT 1 VIEW  Result Date: 02/02/2021 CLINICAL DATA:  Chest tube evaluation EXAM: PORTABLE CHEST 1 VIEW COMPARISON:  Chest x-ray earlier the same day FINDINGS: Interval placement of a right-sided chest tube with the tip in the medial midlung zone. Mildly decreased size of a large loculated right-sided pleural effusion, with associated atelectasis/infiltrate. Extensive airspace opacities again seen throughout the left lung mostly in the mid lung zone, unchanged. No pneumothorax visualized. IMPRESSION: 1. Right-sided chest tube in place. Mildly decreased size of a right-sided loculated pleural effusion with associated atelectasis/infiltrate. 2.  No significant change in the left lung. Electronically Signed   By: Ofilia Neas M.D.   On: 02/02/2021 08:24   DG Chest Port 1 View  Result Date: 02/02/2021 CLINICAL DATA:  Hypoxia, shortness of breath EXAM: PORTABLE CHEST 1 VIEW COMPARISON:  02/01/2021 FINDINGS: Large loculated right pleural effusion. Multifocal patchy opacities in the left mid lung and right lower lobe, suggesting multifocal pneumonia, similar. No pneumothorax. The heart is normal in size. Status post ORIF of the left clavicle. IMPRESSION: Multifocal pneumonia, similar. Large loculated right pleural effusion, unchanged. Electronically Signed   By: Julian Hy M.D.   On: 02/02/2021 03:41   DG CHEST PORT 1 VIEW  Result Date: 02/01/2021 CLINICAL DATA:  Dyspnea. EXAM: PORTABLE CHEST 1 VIEW COMPARISON:  CT 01/30/2021 FINDINGS: Similar opacification of right hemithorax corresponding to large loculated pleural effusion and basilar airspace disease. Progress of patchy opacities throughout the left hemithorax. There may be a small left pleural effusion, new. Left basilar pulmonary nodules are  not well seen by radiograph. The heart size is normal, but obscured by adjacent pulmonary opacities. Remote traumatic injury to the left hemithorax. IMPRESSION: 1. Similar opacification of the right hemithorax corresponding to large loculated pleural effusion and basilar airspace disease on CT. 2. Progression of patchy opacities throughout the left hemithorax,, which may be multifocal infection or pulmonary edema. Possible small left pleural effusion, new. Electronically Signed   By: Keith Rake M.D.   On: 02/01/2021 21:23   DG ESOPHAGUS W SINGLE CM (SOL OR THIN BA)  Result Date: 02/08/2021 CLINICAL DATA:  Difficulty swallowing food. EXAM: ESOPHAGUS/BARIUM SWALLOW/TABLET STUDY TECHNIQUE: Single contrast examination was performed using thin liquid barium. This exam was performed by Rushie Nyhan, and was supervised and interpreted by  Lorin Picket, MD. FLUOROSCOPY TIME:  Radiation Exposure Index (as provided by the fluoroscopic device): If the device does not provide the exposure index: Fluoroscopy Time:  2 minutes 12 seconds. Number of Acquired Images:  None. COMPARISON:  NONE. FINDINGS: Swallowing: Appears normal. No vestibular penetration or aspiration seen. Pharynx: Unremarkable. Esophagus: There may be mild esophageal fold thickening. Esophageal motility: Within normal limits. Hiatal Hernia: None. Gastroesophageal reflux: None. Ingested 36m barium tablet: A 13 mm barium tablet would not pass beyond the gastroesophageal junction, suggesting very minimal narrowing. No high-grade stricture. Other: None. IMPRESSION: 1. A 13 mm barium tablet would not pass beyond the gastroesophageal junction, suggesting very minimal narrowing. No high-grade stricture. 2. Probable mild esophageal fold thickening. Electronically Signed   By: MLorin PicketM.D.   On: 02/08/2021 10:24     Assessment & Plan:   Community acquired pneumonia - Hospitalized from 10/28-11/17 for recurrent aspiration pneumonia with right empyema.  Treated with Augmentin x 21 days, to be completed on 11/25. She continues to have some chest congestion. Cough is intermittently productive with green mucus. Checking sputum sample. Needs repeat imaging today and CBC to monitor leukocytosis. Recommend she start taking mucinex 6058mtwice daily. FU in 2 week or sooner if needed.  Pleural effusion - CT chest on 02/05/21 showed large loculated right pleural effusion. Underwent right chest tube placement on 11/1 by PCCM. She had fibrinolytics on 11/1 and 11/2. Chest tube was removed on 11/4, however, she had enlarging residual posterior effusion which she required pigtail catheter from 11/5-11/11 with additional fibrinolytics. Repeat CXR today pending.   [place]- Needs outpatient follow-up with Oncology  Complex ovarian cyst - Elevated CEA 125 - Needs outpatient  follow-up with GYN  Poor appetite - Recommend patient start taking ensure twice daily as supplement   40 mins spent on case: >50% face to face   ElMartyn EhrichNP 02/24/2021

## 2021-02-24 NOTE — Assessment & Plan Note (Addendum)
-   Hospitalized from 10/28-11/17 for recurrent aspiration pneumonia with right empyema.  Treated with Augmentin x 21 days, to be completed on 11/25. She continues to have some chest congestion. Cough is intermittently productive with green mucus. Checking sputum sample. Needs repeat imaging today and CBC to monitor leukocytosis. Recommend she start taking mucinex 621m twice daily. FU in 2 week or sooner if needed.

## 2021-02-24 NOTE — Telephone Encounter (Signed)
Transition Care Management Unsuccessful Follow-up Telephone Call  Date of discharge and from where:  02/18/21 from [facility]  Attempts:  1st Attempt  Reason for unsuccessful TCM follow-up call:  Unable to leave message    Thea Silversmith, RN, MSN, BSN, [place] Care Management Coordinator [phone]

## 2021-02-24 NOTE — Assessment & Plan Note (Signed)
-   CT chest on 02/05/21 showed large loculated right pleural effusion. Underwent right chest tube placement on 11/1 by PCCM. She had fibrinolytics on 11/1 and 11/2. Chest tube was removed on 11/4, however, she had enlarging residual posterior effusion which she required pigtail catheter from 11/5-11/11 with additional fibrinolytics. Repeat CXR today pending.

## 2021-02-24 NOTE — Assessment & Plan Note (Signed)
-   Needs outpatient follow-up with Oncology

## 2021-02-24 NOTE — Patient Instructions (Addendum)
Recommendations: Continue Augmentin until completed (last day should be around 11/25) Start Mucinex 642m twice daily   Orders: Sputum sample  CXR today  Follow-up: 2 weeks with Dr. ELoanne Drilling

## 2021-02-26 ENCOUNTER — Ambulatory Visit (HOSPITAL_COMMUNITY): Payer: Medicare Other

## 2021-03-01 ENCOUNTER — Telehealth: Payer: Self-pay | Admitting: Primary Care

## 2021-03-01 ENCOUNTER — Telehealth: Payer: Self-pay

## 2021-03-01 ENCOUNTER — Inpatient Hospital Stay: Payer: Medicare Other | Admitting: Primary Care

## 2021-03-01 MED ORDER — GUAIFENESIN ER 600 MG PO TB12: 600.0000 mg | ORAL_TABLET | Freq: Two times a day (BID) | ORAL | 2 refills | Status: DC

## 2021-03-01 NOTE — Telephone Encounter (Signed)
Transition Care Management Unsuccessful Follow-up Telephone Call  Date of discharge and from where:  02/18/21 from [facility]  Attempts:  2nd Attempt  Reason for unsuccessful TCM follow-up call:  Unable to leave message   Thea Silversmith, RN, MSN, BSN, [place] Care Management Coordinator [phone]

## 2021-03-01 NOTE — Telephone Encounter (Signed)
Called and spoke to Ms Margaret [name] at patients group home at [facility] to inform them that I have sent in an rx to the pharmacy for mucinex 600. Nothing furher needed.

## 2021-03-02 ENCOUNTER — Telehealth: Payer: Self-pay

## 2021-03-02 NOTE — Progress Notes (Signed)
Please let patient know CXR showed new patchy airspace opacities right lung. Since she still has cough with colored mucus we should send in another abx.   Please send in Levaquin 592m qd daily x 7 days

## 2021-03-02 NOTE — Telephone Encounter (Signed)
Attempted to speak with Margaret [name] regarding Tulip's appointment tomorrow. Unable to reach her and unable to leave voicemail.

## 2021-03-02 NOTE — Telephone Encounter (Signed)
Attempted to reach [facility] regarding Margaret [name]'s appointment with Dr. Berline Lopes tomorrow. Phone line busy.

## 2021-03-02 NOTE — Telephone Encounter (Signed)
Spoke with Margaret [name] at [facility] regarding Margaret [name]'s appointment tomorrow. Margaret [name] advised to call Margaret [name]'s group home at [phone] or [phone] ([place]).

## 2021-03-02 NOTE — Progress Notes (Unsigned)
GYNECOLOGIC ONCOLOGY NEW PATIENT CONSULTATION   Patient Name: Margaret [name]  Patient Age: 50 y.o. Date of Service: 03/02/21 Referring Provider: Dr. Wendee Beavers  Primary Care Provider: Dettinger, Fransisca Kaufmann, MD Consulting Provider: Jeral Pinch, MD   Assessment/Plan:  ***   A copy of this note was sent to the patient's referring provider.   Jeral Pinch, MD  Division of Gynecologic Oncology  Department of Obstetrics and Gynecology  [facility]  ___________________________________________  Chief Complaint: Chief Complaint  Patient presents with   Complex ovarian cyst    History of Present Illness:  Margaret [name] is a 50 y.o. y.o. female who is seen in consultation at the request of Dr. Cyndia Skeeters for an evaluation of ***  50 year old F with PMH of intellectual disability, childhood TB, schizophrenia, hepatitis B, ovarian cancer with elevated CA 125 presenting with progressive cough.  Initially admitted at [facility] for community-acquired pneumonia.  CT showed large loculated right pleural effusion.  She was transferred to [facility]. She underwent right chest tube placement on 11/12 by PCCM.  She had fibrinolytics on 11/1 and 11/2.  Chest tube removed on 11/4.  However, she had enlarging residual posterior effusion for which she required pigtail catheter from 11/5-11/11.  She also had additional fibrinolytics.  Remains on p.o. Augmentin for possible aspiration pneumonia with right empyema. Currently on room air.  PCCM recommended p.o. antibiotics for a total of 21 days which ends on 02/26/2021.   She also have a right adrenal mass and cystic ovarian lesion with elevated CEA 125 for which outpatient follow-up are recommended.   Patient is medically stable for discharge.  Group home to assess patient to see if they can take her back   See individual problem list below for more on hospital course.   Discharge Diagnoses:  Recurrent aspiration pneumonia  with right empyema -S/p chest tube and pigtail as above.  Follow-up cultures NGTD. -Repeat CXR on 11/11 with patchy infiltrates bilaterally, small bilateral pleural effusions and no PTX. -CTX and Zithromax>>> p.o. Augmentin for a total of 3 weeks which ends on 02/26/2021 per PCCM -PCCM to arrange outpatient follow-up -SLP recommended regular diet. -Leukocytosis improved. Right adrenal mass Cystic right ovarian lesion with elevated CEA 125 -Needs work-up for adrenal mass once infection clears. -Ambulatory referral to oncology and gynecology in place. Schizophrenia: Stable. -Continue home clozapine, chlorpromazine, benztropine and Prozac.  Hypokalemia: Resolved. Normocytic anemia: H&H stable. History of hepatitis B infection -Continue home Entecavir. Intellectual disability: From group home.  Seems to be fairly oriented but does not seem to have great insight.  Not fully cooperative. Leukocytosis/bandemia/thrombocytosis: Improved. -Recheck CBC in 1 week Physical deconditioning-no need identified by PT/OT. Severe malnutrition: POA.  As evidenced by low BMI, significant muscle mass and subcu fat loss. Body mass index is 18.16 kg/m. Nutrition Problem: Severe Malnutrition Etiology: chronic illness (met Ovarian cancer) Signs/Symptoms: severe muscle depletion, severe fat depletion, percent weight loss Percent weight loss: 15.3 % Interventions: Ensure Enlive (each supplement provides 350kcal and 20 grams of protein), MVI    PAST MEDICAL HISTORY:  Past Medical History:  Diagnosis Date   Hematuria 11/12/2014   Hepatitis B carrier ([place])    Hypothyroidism    Impulse disorder    IUD (intrauterine device) in place 11/12/2014   Inserted 12/24/12 at [facility]   Mild intellectual disability    Schizophrenia ([place])    Schizophrenia ([place])    Tuberculosis    as a child   Urinary frequency 11/12/2014  PAST SURGICAL HISTORY:  Past Surgical History:  Procedure Laterality Date   ARM WOUND REPAIR /  CLOSURE     TRACHEAL SURGERY     TRACHEOSTOMY      OB/GYN HISTORY:  OB History  Gravida Para Term Preterm AB Living  0 0 0 0 0 0  SAB IAB Ectopic Multiple Live Births  0 0 0 0      No LMP recorded.  Age at menarche: ***  Age at menopause: *** Hx of HRT: *** Hx of STDs: *** Last pap: *** History of abnormal pap smears: ***  SCREENING STUDIES:  Last mammogram: ***  Last colonoscopy: *** Last bone mineral density: ***  MEDICATIONS: Outpatient Encounter Medications as of 03/03/2021  Medication Sig   acetaminophen (TYLENOL) 650 MG CR tablet Take 650 mg by mouth every 8 (eight) hours as needed for pain.   albuterol (VENTOLIN HFA) 108 (90 Base) MCG/ACT inhaler INHALE 2 PUFFS EVERY 4 HOURS AS NEEDED FOR WHEEZING.   [EXPIRED] amoxicillin-clavulanate (AUGMENTIN) 875-125 MG tablet Take 1 tablet by mouth every 12 (twelve) hours for 8 days.   benztropine (COGENTIN) 1 MG tablet Take 1 tablet (1 mg total) by mouth 2 (two) times daily.   chlorproMAZINE (THORAZINE) 50 MG tablet Take 50 mg by mouth as needed. One tablet by mouth PRN agitation, max of 2 tablets, 100 mg in 24 hours   Cholecalciferol (VITAMIN D3) 50 MCG (2000 UT) capsule TAKE 1 CAPSULE BY MOUTH EVERY MORNING.   cloZAPine (CLOZARIL) 100 MG tablet Take 200 mg by mouth at bedtime.   entecavir (BARACLUDE) 0.5 MG tablet TAKE (1) TABLET BY MOUTH ONCE DAILY.   feeding supplement, ENSURE COMPLETE, (ENSURE COMPLETE) LIQD Take 237 mLs by mouth 2 (two) times daily between meals.   Fish Oil-Cholecalciferol (FISH OIL + D3) 1200-1000 MG-UNIT CAPS TAKE 1 CAPSULE BY MOUTH TWICE DAILY.   FLUoxetine (PROZAC) 20 MG capsule TAKE 3 CAPSULES BY MOUTH ONCE DAILY.   fluticasone (FLONASE) 50 MCG/ACT nasal spray SPRAY 2 SPRAYS IN RIGHT NOSTRIL ONLY TWICE DAILY.   guaiFENesin (MUCINEX) 600 MG 12 hr tablet Take 1 tablet (600 mg total) by mouth 2 (two) times daily.   levonorgestrel (MIRENA) 20 MCG/24HR IUD 1 each by Intrauterine route once.   mirtazapine  (REMERON) 7.5 MG tablet Take 7.5 mg by mouth at bedtime.   Multiple Vitamins-Minerals (CENTROVITE) TABS TAKE 1 TABLET BY MOUTH AT BEDTIME.   polyethylene glycol powder (GLYCOLAX/MIRALAX) 17 GM/SCOOP powder Take 17 g by mouth daily. (Patient taking differently: Take 17 g by mouth daily as needed.)   senna-docusate (SENOKOT-S) 8.6-50 MG tablet Take 1 tablet by mouth 2 (two) times daily between meals as needed for mild constipation.   Skin Protectants, Misc. (MINERIN CREME) CREA APPLY MODERATE AMOUNT TOPICALLY TO DRY SKIN ON SOLES & HEELS OF BOTH FEET AFTER SHOWER. (NURSING STAFF TO SUPERVISE APPLICATION)   vitamin E 200 UNIT capsule Take 1 capsule (200 Units total) by mouth daily.   No facility-administered encounter medications on file as of 03/03/2021.    ALLERGIES:  No Known Allergies   FAMILY HISTORY:  Family History  Adopted: Yes  Family history unknown: Yes     SOCIAL HISTORY:  Social Connections: Socially Isolated   Frequency of Communication with Friends and Family: More than three times a week   Frequency of Social Gatherings with Friends and Family: More than three times a week   Attends Religious Services: Never   Marine scientist or Organizations: No  Attends Archivist Meetings: Never   Marital Status: Never married    REVIEW OF SYSTEMS:  Denies appetite changes, fevers, chills, fatigue, unexplained weight changes. Denies hearing loss, neck lumps or masses, mouth sores, ringing in ears or voice changes. Denies cough or wheezing.  Denies shortness of breath. Denies chest pain or palpitations. Denies leg swelling. Denies abdominal distention, pain, blood in stools, constipation, diarrhea, nausea, vomiting, or early satiety. Denies pain with intercourse, dysuria, frequency, hematuria or incontinence. Denies hot flashes, pelvic pain, vaginal bleeding or vaginal discharge.   Denies joint pain, back pain or muscle pain/cramps. Denies itching, rash, or  wounds. Denies dizziness, headaches, numbness or seizures. Denies swollen lymph nodes or glands, denies easy bruising or bleeding. Denies anxiety, depression, confusion, or decreased concentration.  Physical Exam:  Vital Signs for this encounter:  There were no vitals taken for this visit. There is no height or weight on file to calculate BMI. General: Alert, oriented, no acute distress.  HEENT: Normocephalic, atraumatic. Sclera anicteric.  Chest: Clear to auscultation bilaterally. No wheezes, rhonchi, or rales. Cardiovascular: Regular rate and rhythm, no murmurs, rubs, or gallops.  Abdomen: ***Obese. Normoactive bowel sounds. Soft, nondistended, nontender to palpation. No masses or hepatosplenomegaly appreciated. No palpable fluid wave.  Extremities: Grossly normal range of motion. Warm, well perfused. No edema bilaterally.  Skin: No rashes or lesions.  Lymphatics: No cervical, supraclavicular, or inguinal adenopathy.  GU:  Normal external female genitalia. ***  No lesions. No discharge or bleeding.             Bladder/urethra:  No lesions or masses, well supported bladder             Vagina: ***             Cervix: Normal appearing, no lesions.             Uterus: *** Small, mobile, no parametrial involvement or nodularity.             Adnexa: *** masses.  Rectal: ***  LABORATORY AND RADIOLOGIC DATA:  ***Outside medical records were reviewed to synthesize the above history, along with the history and physical obtained during the visit.   Lab Results  Component Value Date   WBC 14.2 (H) 02/24/2021   HGB 10.2 (L) 02/24/2021   HCT 31.9 (L) 02/24/2021   PLT 444 (H) 02/24/2021   GLUCOSE 113 (H) 02/18/2021   CHOL 133 11/30/2020   TRIG 67 11/30/2020   HDL 51 11/30/2020   LDLCALC 68 11/30/2020   ALT 25 02/01/2021   AST 28 02/01/2021   NA 133 (L) 02/18/2021   K 3.7 02/18/2021   CL 98 02/18/2021   CREATININE 0.65 02/18/2021   BUN 11 02/18/2021   CO2 27 02/18/2021   TSH 1.640  11/30/2020   INR 1.04 03/24/2015   Tumor markers on 10/30: CA-125: 111 CEA: 1.9  Pleural fluid cytology on 11/1: no malignant cells identified  CT A/P on 10/19: IMPRESSION: Multiple pulmonary nodules in both lung bases, highly suspicious for pulmonary metastases. Consider chest CT with contrast or PET-CT for further evaluation.   3.1 cm indeterminate cystic lesion in right adnexal region. Cystic ovarian neoplasm cannot be excluded. Pelvic MRI without and with contrast is recommended for further characterization.  CT chest on 10/29: IMPRESSION: Interval development of a large partially loculated right pleural effusion with near complete collapse of the right lung base in atelectasis of the right upper lobe and resultant mild mediastinal shift to the left.  Evolving infectious or inflammatory process within the aerated lungs with interval decrease in size of focal pulmonary nodules noted on prior examination, but progressive peripheral consolidation and ground-glass pulmonary infiltrate demonstrating a basilar predominance. Differential considerations are led by acute infection though pulmonary vasculitides or drug reaction could appear similarly. Diagnostic and therapeutic thoracentesis of the large right pleural effusion may be helpful for further management.   Extensive multi-vessel coronary artery calcification.   Mild emphysema   No evidence of abdominal or pelvic metastatic disease.   Marked diffuse colonic distension by stool.   Gallbladder sludge or gallstones. No radiographic evidence of cholecystitis or biliary ductal dilatation.

## 2021-03-03 ENCOUNTER — Other Ambulatory Visit: Payer: Self-pay

## 2021-03-03 ENCOUNTER — Telehealth: Payer: Self-pay | Admitting: Gynecologic Oncology

## 2021-03-03 ENCOUNTER — Encounter: Payer: Self-pay | Admitting: Nurse Practitioner

## 2021-03-03 ENCOUNTER — Inpatient Hospital Stay (HOSPITAL_COMMUNITY)
Admission: EM | Admit: 2021-03-03 | Discharge: 2021-03-09 | DRG: 871 | Disposition: A | Discharge status: Home / Self Care | Payer: Medicare Other | Attending: Internal Medicine | Admitting: Internal Medicine

## 2021-03-03 ENCOUNTER — Ambulatory Visit (INDEPENDENT_AMBULATORY_CARE_PROVIDER_SITE_OTHER): Payer: Medicare Other | Admitting: Nurse Practitioner

## 2021-03-03 ENCOUNTER — Inpatient Hospital Stay: Payer: Medicare Other | Admitting: Gynecologic Oncology

## 2021-03-03 ENCOUNTER — Emergency Department (HOSPITAL_COMMUNITY): Payer: Medicare Other

## 2021-03-03 VITALS — BP 109/60 | HR 126 | Temp 97.9°F | Resp 20 | Ht 64.0 in | Wt 103.0 lb

## 2021-03-03 DIAGNOSIS — Z681 Body mass index (BMI) 19 or less, adult: Secondary | ICD-10-CM

## 2021-03-03 DIAGNOSIS — F79 Unspecified intellectual disabilities: Secondary | ICD-10-CM

## 2021-03-03 DIAGNOSIS — I959 Hypotension, unspecified: Secondary | ICD-10-CM | POA: Diagnosis present

## 2021-03-03 DIAGNOSIS — N9489 Other specified conditions associated with female genital organs and menstrual cycle: Secondary | ICD-10-CM

## 2021-03-03 DIAGNOSIS — A419 Sepsis, unspecified organism: Principal | ICD-10-CM | POA: Diagnosis present

## 2021-03-03 DIAGNOSIS — D638 Anemia in other chronic diseases classified elsewhere: Secondary | ICD-10-CM | POA: Diagnosis present

## 2021-03-03 DIAGNOSIS — R000 Tachycardia, unspecified: Secondary | ICD-10-CM | POA: Diagnosis not present

## 2021-03-03 DIAGNOSIS — E43 Unspecified severe protein-calorie malnutrition: Secondary | ICD-10-CM | POA: Diagnosis present

## 2021-03-03 DIAGNOSIS — F209 Schizophrenia, unspecified: Secondary | ICD-10-CM | POA: Diagnosis present

## 2021-03-03 DIAGNOSIS — R0603 Acute respiratory distress: Secondary | ICD-10-CM

## 2021-03-03 DIAGNOSIS — I2510 Atherosclerotic heart disease of native coronary artery without angina pectoris: Secondary | ICD-10-CM | POA: Diagnosis present

## 2021-03-03 DIAGNOSIS — R918 Other nonspecific abnormal finding of lung field: Secondary | ICD-10-CM | POA: Diagnosis not present

## 2021-03-03 DIAGNOSIS — J189 Pneumonia, unspecified organism: Secondary | ICD-10-CM | POA: Diagnosis present

## 2021-03-03 DIAGNOSIS — Z79899 Other long term (current) drug therapy: Secondary | ICD-10-CM | POA: Diagnosis not present

## 2021-03-03 DIAGNOSIS — N83299 Other ovarian cyst, unspecified side: Secondary | ICD-10-CM | POA: Diagnosis present

## 2021-03-03 DIAGNOSIS — E860 Dehydration: Secondary | ICD-10-CM | POA: Diagnosis present

## 2021-03-03 DIAGNOSIS — Z975 Presence of (intrauterine) contraceptive device: Secondary | ICD-10-CM | POA: Diagnosis not present

## 2021-03-03 DIAGNOSIS — J90 Pleural effusion, not elsewhere classified: Secondary | ICD-10-CM | POA: Diagnosis not present

## 2021-03-03 DIAGNOSIS — Z20822 Contact with and (suspected) exposure to covid-19: Secondary | ICD-10-CM | POA: Diagnosis present

## 2021-03-03 DIAGNOSIS — N838 Other noninflammatory disorders of ovary, fallopian tube and broad ligament: Secondary | ICD-10-CM | POA: Diagnosis not present

## 2021-03-03 DIAGNOSIS — N83201 Unspecified ovarian cyst, right side: Secondary | ICD-10-CM | POA: Diagnosis present

## 2021-03-03 DIAGNOSIS — Z8611 Personal history of tuberculosis: Secondary | ICD-10-CM | POA: Diagnosis not present

## 2021-03-03 DIAGNOSIS — F70 Mild intellectual disabilities: Secondary | ICD-10-CM | POA: Diagnosis present

## 2021-03-03 DIAGNOSIS — B181 Chronic viral hepatitis B without delta-agent: Secondary | ICD-10-CM | POA: Diagnosis present

## 2021-03-03 DIAGNOSIS — E876 Hypokalemia: Secondary | ICD-10-CM | POA: Diagnosis present

## 2021-03-03 DIAGNOSIS — E871 Hypo-osmolality and hyponatremia: Secondary | ICD-10-CM | POA: Diagnosis present

## 2021-03-03 DIAGNOSIS — E039 Hypothyroidism, unspecified: Secondary | ICD-10-CM | POA: Diagnosis present

## 2021-03-03 DIAGNOSIS — R5081 Fever presenting with conditions classified elsewhere: Secondary | ICD-10-CM | POA: Diagnosis not present

## 2021-03-03 DIAGNOSIS — J690 Pneumonitis due to inhalation of food and vomit: Secondary | ICD-10-CM | POA: Diagnosis present

## 2021-03-03 DIAGNOSIS — R509 Fever, unspecified: Secondary | ICD-10-CM | POA: Diagnosis not present

## 2021-03-03 LAB — COMPREHENSIVE METABOLIC PANEL
ALT: 16 U/L (ref 0–44)
AST: 17 U/L (ref 15–41)
Albumin: 2.7 g/dL — ABNORMAL LOW (ref 3.5–5.0)
Alkaline Phosphatase: 87 U/L (ref 38–126)
Anion gap: 9 (ref 5–15)
BUN: 12 mg/dL (ref 6–20)
CO2: 23 mmol/L (ref 22–32)
Calcium: 8.4 mg/dL — ABNORMAL LOW (ref 8.9–10.3)
Chloride: 101 mmol/L (ref 98–111)
Creatinine, Ser: 0.48 mg/dL (ref 0.44–1.00)
GFR, Estimated: >60 mL/min (ref >60)
Glucose, Bld: 108 mg/dL — ABNORMAL HIGH (ref 70–99)
Potassium: 3.7 mmol/L (ref 3.5–5.1)
Sodium: 133 mmol/L — ABNORMAL LOW (ref 135–145)
Total Bilirubin: 0.5 mg/dL (ref 0.3–1.2)
Total Protein: 6.4 g/dL — ABNORMAL LOW (ref 6.5–8.1)

## 2021-03-03 LAB — PROTIME-INR
INR: 1.2 (ref 0.8–1.2)
Prothrombin Time: 14.9 seconds (ref 11.4–15.2)

## 2021-03-03 LAB — CBC WITH DIFFERENTIAL/PLATELET
Abs Immature Granulocytes: 0.15 10*3/uL — ABNORMAL HIGH (ref 0.00–0.07)
Basophils Absolute: 0.1 10*3/uL (ref 0.0–0.1)
Basophils Relative: 0 %
Eosinophils Absolute: 0 10*3/uL (ref 0.0–0.5)
Eosinophils Relative: 0 %
HCT: 29.5 % — ABNORMAL LOW (ref 36.0–46.0)
Hemoglobin: 9.1 g/dL — ABNORMAL LOW (ref 12.0–15.0)
Immature Granulocytes: 1 %
Lymphocytes Relative: 6 %
Lymphs Abs: 1.6 10*3/uL (ref 0.7–4.0)
MCH: 27.2 pg (ref 26.0–34.0)
MCHC: 30.8 g/dL (ref 30.0–36.0)
MCV: 88.1 fL (ref 80.0–100.0)
Monocytes Absolute: 1 10*3/uL (ref 0.1–1.0)
Monocytes Relative: 4 %
Neutro Abs: 22.1 10*3/uL — ABNORMAL HIGH (ref 1.7–7.7)
Neutrophils Relative %: 89 %
Platelets: 280 10*3/uL (ref 150–400)
RBC: 3.35 MIL/uL — ABNORMAL LOW (ref 3.87–5.11)
RDW: 16.6 % — ABNORMAL HIGH (ref 11.5–15.5)
WBC: 24.8 10*3/uL — ABNORMAL HIGH (ref 4.0–10.5)
nRBC: 0 % (ref 0.0–0.2)

## 2021-03-03 LAB — VERITOR FLU A/B WAIVED
Influenza A: NEGATIVE
Influenza B: NEGATIVE

## 2021-03-03 LAB — RESP PANEL BY RT-PCR (FLU A&B, COVID) ARPGX2
Influenza A by PCR: NEGATIVE
Influenza B by PCR: NEGATIVE
SARS Coronavirus 2 by RT PCR: NEGATIVE

## 2021-03-03 LAB — LACTIC ACID, PLASMA
Lactic Acid, Venous: 0.8 mmol/L (ref 0.5–1.9)
Lactic Acid, Venous: 1.2 mmol/L (ref 0.5–1.9)

## 2021-03-03 LAB — APTT: aPTT: 39 seconds — ABNORMAL HIGH (ref 24–36)

## 2021-03-03 MED ORDER — VANCOMYCIN HCL 1000 MG/200ML IV SOLN
1000.0000 mg | INTRAVENOUS | Status: DC
  Filled 2021-03-03: qty 200

## 2021-03-03 MED ORDER — LACTATED RINGERS IV BOLUS (SEPSIS)
500.0000 mL | Freq: Once | INTRAVENOUS | Status: AC
  Administered 2021-03-03: 500 mL via INTRAVENOUS

## 2021-03-03 MED ORDER — ENSURE ENLIVE PO LIQD
237.0000 mL | Freq: Two times a day (BID) | ORAL | Status: DC
  Administered 2021-03-04 – 2021-03-09 (×9): 237 mL via ORAL
  Filled 2021-03-03 (×2): qty 237

## 2021-03-03 MED ORDER — SODIUM CHLORIDE 0.9 % IV SOLN: INTRAVENOUS | Status: AC

## 2021-03-03 MED ORDER — SODIUM CHLORIDE 0.9 % IV SOLN
2.0000 g | Freq: Once | INTRAVENOUS | Status: AC
  Administered 2021-03-03: 2 g via INTRAVENOUS
  Filled 2021-03-03: qty 2

## 2021-03-03 MED ORDER — MIRTAZAPINE 15 MG PO TABS
15.0000 mg | ORAL_TABLET | Freq: Every day | ORAL | Status: DC
  Administered 2021-03-03 – 2021-03-08 (×6): 15 mg via ORAL
  Filled 2021-03-03 (×7): qty 1

## 2021-03-03 MED ORDER — ONDANSETRON HCL 4 MG/2ML IJ SOLN: 4.0000 mg | Freq: Four times a day (QID) | INTRAMUSCULAR | Status: DC | PRN

## 2021-03-03 MED ORDER — ACETAMINOPHEN 325 MG PO TABS
650.0000 mg | ORAL_TABLET | Freq: Four times a day (QID) | ORAL | Status: DC | PRN
  Administered 2021-03-04 – 2021-03-07 (×7): 650 mg via ORAL
  Filled 2021-03-03 (×7): qty 2

## 2021-03-03 MED ORDER — AMANTADINE HCL 100 MG PO CAPS
100.0000 mg | ORAL_CAPSULE | Freq: Every morning | ORAL | Status: DC
  Administered 2021-03-04 – 2021-03-09 (×6): 100 mg via ORAL
  Filled 2021-03-03 (×6): qty 1

## 2021-03-03 MED ORDER — ONDANSETRON HCL 4 MG PO TABS
4.0000 mg | ORAL_TABLET | Freq: Four times a day (QID) | ORAL | Status: DC | PRN
  Administered 2021-03-07: 20:00:00 4 mg via ORAL
  Filled 2021-03-03: qty 1

## 2021-03-03 MED ORDER — FLUOXETINE HCL 20 MG PO CAPS
60.0000 mg | ORAL_CAPSULE | Freq: Every day | ORAL | Status: DC
  Administered 2021-03-04 – 2021-03-09 (×6): 60 mg via ORAL
  Filled 2021-03-03 (×6): qty 3

## 2021-03-03 MED ORDER — CLOZAPINE 100 MG PO TABS
200.0000 mg | ORAL_TABLET | Freq: Every day | ORAL | Status: DC
  Administered 2021-03-03 – 2021-03-08 (×6): 200 mg via ORAL
  Filled 2021-03-03 (×6): qty 2

## 2021-03-03 MED ORDER — ACETAMINOPHEN 325 MG PO TABS
650.0000 mg | ORAL_TABLET | Freq: Once | ORAL | Status: AC
  Administered 2021-03-03: 650 mg via ORAL
  Filled 2021-03-03: qty 2

## 2021-03-03 MED ORDER — VANCOMYCIN HCL 1000 MG/200ML IV SOLN
1000.0000 mg | Freq: Once | INTRAVENOUS | Status: AC
  Administered 2021-03-03: 1000 mg via INTRAVENOUS
  Filled 2021-03-03: qty 200

## 2021-03-03 MED ORDER — HYDROXYZINE HCL 25 MG PO TABS
25.0000 mg | ORAL_TABLET | Freq: Three times a day (TID) | ORAL | Status: DC
  Administered 2021-03-03 – 2021-03-04 (×2): 25 mg via ORAL
  Filled 2021-03-03 (×2): qty 1

## 2021-03-03 MED ORDER — LEVOFLOXACIN 500 MG PO TABS: 500.0000 mg | ORAL_TABLET | Freq: Every day | ORAL | 0 refills | Status: DC

## 2021-03-03 MED ORDER — ACETAMINOPHEN 650 MG RE SUPP: 650.0000 mg | Freq: Four times a day (QID) | RECTAL | Status: DC | PRN

## 2021-03-03 MED ORDER — ENOXAPARIN SODIUM 40 MG/0.4ML IJ SOSY
40.0000 mg | PREFILLED_SYRINGE | INTRAMUSCULAR | Status: DC
  Administered 2021-03-04 – 2021-03-09 (×5): 40 mg via SUBCUTANEOUS
  Filled 2021-03-03 (×6): qty 0.4

## 2021-03-03 MED ORDER — POLYETHYLENE GLYCOL 3350 17 G PO PACK: 17.0000 g | PACK | Freq: Every day | ORAL | Status: DC | PRN

## 2021-03-03 MED ORDER — SODIUM CHLORIDE 0.9 % IV SOLN
2.0000 g | Freq: Three times a day (TID) | INTRAVENOUS | Status: DC
  Administered 2021-03-03 – 2021-03-08 (×14): 2 g via INTRAVENOUS
  Filled 2021-03-03 (×15): qty 2

## 2021-03-03 MED ORDER — LACTATED RINGERS IV SOLN: INTRAVENOUS | Status: DC

## 2021-03-03 MED ORDER — LACTATED RINGERS IV BOLUS (SEPSIS)
1000.0000 mL | Freq: Once | INTRAVENOUS | Status: AC
  Administered 2021-03-03: 1000 mL via INTRAVENOUS

## 2021-03-03 MED ORDER — METRONIDAZOLE 500 MG/100ML IV SOLN
500.0000 mg | Freq: Once | INTRAVENOUS | Status: AC
  Administered 2021-03-03: 500 mg via INTRAVENOUS
  Filled 2021-03-03: qty 100

## 2021-03-03 NOTE — ED Triage Notes (Signed)
Pt to the ED [facility] with flu-like symptoms after testing negative today. Pt was Dx with PNU last week and symptoms continue.

## 2021-03-03 NOTE — ED Provider Notes (Signed)
[place] Provider Note   CSN: [id number] Arrival date & time: 03/03/21  1246     History Chief Complaint  Patient presents with   Fever    Margaret [name] is a 50 y.o. female.  Pt has had a fever today.  Pt recently diagnosed with metastatic ovarian cancer.  Pt complains of abdominal pain   The history is provided by the patient and a caregiver. No language interpreter was used.  Fever Max temp prior to arrival:  102 Temp source:  Oral and rectal Severity:  Severe Onset quality:  Sudden Timing:  Constant Progression:  Worsening Chronicity:  New Relieved by:  Nothing Worsened by:  Nothing Ineffective treatments:  None tried Associated symptoms: cough   Associated symptoms: no chest pain, no headaches and no sore throat   Risk factors: immunosuppression       Past Medical History:  Diagnosis Date   Hematuria 11/12/2014   Hepatitis B carrier ([place])    Hypothyroidism    Impulse disorder    IUD (intrauterine device) in place 11/12/2014   Inserted 12/24/12 at [facility]   Mild intellectual disability    Schizophrenia ([place])    Schizophrenia ([place])    Tuberculosis    as a child   Urinary frequency 11/12/2014    Patient Active Problem List   Diagnosis Date Noted   Right adrenal mass ([place]) 02/24/2021   Poor appetite 02/24/2021   Protein-calorie malnutrition, severe 02/10/2021   Acute respiratory failure with hypoxia ([place]) 02/01/2021   Centrilobular emphysema ([place]) 02/01/2021   Acute pulmonary edema ([place]) 02/01/2021   Community acquired pneumonia 01/30/2021   Pleural effusion 01/30/2021   Hypokalemia 01/30/2021   Psychiatric disorder 01/30/2021   Leukocytosis 01/30/2021   Complex ovarian cyst 01/25/2021   Multiple pulmonary nodules 01/25/2021   RLQ abdominal mass 01/05/2021   Vitamin D deficiency 11/30/2020   Weight loss 03/19/2019   Schizophrenia ([place])    Hepatitis B    Hyperlipidemia 12/18/2014   History of tuberculosis 11/17/2014    Orthostatic hypotension 11/17/2014   Right nasal polyps 11/17/2014   IUD (intrauterine device) in place 11/12/2014   Chronic hepatitis B ([place]) 11/10/2014    Past Surgical History:  Procedure Laterality Date   ARM WOUND REPAIR / CLOSURE     TRACHEAL SURGERY     TRACHEOSTOMY       OB History     Gravida  0   Para  0   Term  0   Preterm  0   AB  0   Living  0      SAB  0   IAB  0   Ectopic  0   Multiple  0   Live Births              Family History  Adopted: Yes  Family history unknown: Yes    Social History   Tobacco Use   Smoking status: Never   Smokeless tobacco: Never  Vaping Use   Vaping Use: Never used  Substance Use Topics   Alcohol use: No    Alcohol/week: 0.0 standard drinks   Drug use: No    Home Medications Prior to Admission medications   Medication Sig Start Date End Date Taking? Authorizing Provider  acetaminophen (TYLENOL) 650 MG CR tablet Take 650 mg by mouth every 8 (eight) hours as needed for pain.    [provider]  albuterol (VENTOLIN HFA) 108 (90 Base) MCG/ACT inhaler INHALE 2 PUFFS EVERY 4 HOURS  AS NEEDED FOR WHEEZING. 07/30/20   Dettinger, Fransisca Kaufmann, MD  benztropine (COGENTIN) 1 MG tablet Take 1 tablet (1 mg total) by mouth 2 (two) times daily. 01/08/20   Dettinger, Fransisca Kaufmann, MD  chlorproMAZINE (THORAZINE) 50 MG tablet Take 50 mg by mouth as needed. One tablet by mouth PRN agitation, max of 2 tablets, 100 mg in 24 hours    [provider]  Cholecalciferol (VITAMIN D3) 50 MCG (2000 UT) capsule TAKE 1 CAPSULE BY MOUTH EVERY MORNING. 06/11/20   Dettinger, Fransisca Kaufmann, MD  cloZAPine (CLOZARIL) 100 MG tablet Take 200 mg by mouth at bedtime.    [provider]  entecavir (BARACLUDE) 0.5 MG tablet TAKE (1) TABLET BY MOUTH ONCE DAILY. 09/02/20   Harvel Quale, MD  feeding supplement, ENSURE COMPLETE, (ENSURE COMPLETE) LIQD Take 237 mLs by mouth 2 (two) times daily between meals. 02/24/21   Martyn Ehrich, NP  Fish Oil-Cholecalciferol (FISH OIL + D3) 1200-1000 MG-UNIT CAPS TAKE 1 CAPSULE BY MOUTH TWICE DAILY. 03/05/20   Dettinger, Fransisca Kaufmann, MD  FLUoxetine (PROZAC) 20 MG capsule TAKE 3 CAPSULES BY MOUTH ONCE DAILY. 12/03/20   Dettinger, Fransisca Kaufmann, MD  fluticasone (FLONASE) 50 MCG/ACT nasal spray SPRAY 2 SPRAYS IN RIGHT NOSTRIL ONLY TWICE DAILY. 10/04/19   Dettinger, Fransisca Kaufmann, MD  guaiFENesin (MUCINEX) 600 MG 12 hr tablet Take 1 tablet (600 mg total) by mouth 2 (two) times daily. 03/01/21 03/01/22  Martyn Ehrich, NP  levofloxacin (LEVAQUIN) 500 MG tablet Take 1 tablet (500 mg total) by mouth daily. 03/03/21   Martyn Ehrich, NP  levonorgestrel (MIRENA) 20 MCG/24HR IUD 1 each by Intrauterine route once.    [provider]  mirtazapine (REMERON) 7.5 MG tablet Take 7.5 mg by mouth at bedtime. 10/28/20   [provider]  Multiple Vitamins-Minerals (CENTROVITE) TABS TAKE 1 TABLET BY MOUTH AT BEDTIME. 05/04/20   Dettinger, Fransisca Kaufmann, MD  polyethylene glycol powder (GLYCOLAX/MIRALAX) 17 GM/SCOOP powder Take 17 g by mouth daily. Patient taking differently: Take 17 g by mouth daily as needed. 01/21/21   Rehman, Mechele Dawley, MD  senna-docusate (SENOKOT-S) 8.6-50 MG tablet Take 1 tablet by mouth 2 (two) times daily between meals as needed for mild constipation. 02/18/21   Mercy Riding, MD  Skin Protectants, Misc. (MINERIN CREME) CREA APPLY MODERATE AMOUNT TOPICALLY TO DRY SKIN ON SOLES & HEELS OF BOTH FEET AFTER SHOWER. (NURSING STAFF TO SUPERVISE APPLICATION) 28/36/62   Dettinger, Fransisca Kaufmann, MD  vitamin E 200 UNIT capsule Take 1 capsule (200 Units total) by mouth daily. 05/13/20   Dettinger, Fransisca Kaufmann, MD    Allergies    Patient has no known allergies.  Review of Systems   Review of Systems  Constitutional:  Positive for fever.  HENT:  Negative for sore throat.   Respiratory:  Positive for cough.   Cardiovascular:  Negative for chest pain.  Neurological:  Negative for headaches.   All other systems reviewed and are negative.  Physical Exam Updated Vital Signs BP (!) 93/54   Pulse (!) 105   Temp 99.1 F (37.3 C) (Oral)   Resp (!) 21   Ht 5' 4"  (1.626 m)   Wt 46.7 kg   SpO2 92%   BMI 17.68 kg/m   Physical Exam Constitutional:      Appearance: She is ill-appearing.     Comments: Thin, pale   HENT:     Head: Normocephalic.     Right Ear: External ear normal.  Left Ear: External ear normal.     Nose: Nose normal.     Mouth/Throat:     Mouth: Mucous membranes are moist.  Eyes:     Extraocular Movements: Extraocular movements intact.     Pupils: Pupils are equal, round, and reactive to light.  Cardiovascular:     Rate and Rhythm: Normal rate.  Pulmonary:     Effort: Pulmonary effort is normal.     Breath sounds: No rhonchi.  Abdominal:     General: Abdomen is flat. Bowel sounds are normal.  Musculoskeletal:        General: Normal range of motion.     Cervical back: Normal range of motion.  Skin:    General: Skin is warm.  Neurological:     General: No focal deficit present.     Mental Status: She is alert.     Comments: Repetitive right arm movement   Psychiatric:        Mood and Affect: Mood normal.    ED Results / Procedures / Treatments   Labs (all labs ordered are listed, but only abnormal results are displayed) Labs Reviewed  COMPREHENSIVE METABOLIC PANEL - Abnormal; Notable for the following components:      Result Value   Sodium 133 (*)    Glucose, Bld 108 (*)    Calcium 8.4 (*)    Total Protein 6.4 (*)    Albumin 2.7 (*)    All other components within normal limits  CBC WITH DIFFERENTIAL/PLATELET - Abnormal; Notable for the following components:   WBC 24.8 (*)    RBC 3.35 (*)    Hemoglobin 9.1 (*)    HCT 29.5 (*)    RDW 16.6 (*)    Neutro Abs 22.1 (*)    Abs Immature Granulocytes 0.15 (*)    All other components within normal limits  APTT - Abnormal; Notable for the following components:   aPTT 39 (*)    All other  components within normal limits  RESP PANEL BY RT-PCR (FLU A&B, COVID) ARPGX2  CULTURE, BLOOD (ROUTINE X 2)  CULTURE, BLOOD (ROUTINE X 2)  URINE CULTURE  LACTIC ACID, PLASMA  PROTIME-INR  LACTIC ACID, PLASMA  URINALYSIS, ROUTINE W REFLEX MICROSCOPIC  POC URINE PREG, ED  POC URINE PREG, ED    EKG None  Radiology DG Chest Portable 1 View  Result Date: 03/03/2021 CLINICAL DATA:  Fever, recent diagnosis pneumonia EXAM: PORTABLE CHEST 1 VIEW COMPARISON:  02/24/2021 FINDINGS: The heart size and mediastinal contours are within normal limits. Similar appearance of small bilateral pleural effusions. Unchanged heterogeneous airspace opacity throughout the lungs. Plate and screw fixation of the left clavicle. IMPRESSION: Unchanged heterogeneous airspace opacity throughout the lungs and small bilateral pleural effusions. No new airspace opacity. Electronically Signed   By: Delanna Ahmadi M.D.   On: 03/03/2021 14:15    Procedures .Critical Care Performed by: Fransico Meadow, PA-C Authorized by: Fransico Meadow, PA-C   Critical care provider statement:    Critical care time (minutes):  45   Critical care start time:  03/03/2021 2:00 PM   Critical care end time:  03/03/2021 3:51 PM   Critical care was necessary to treat or prevent imminent or life-threatening deterioration of the following conditions:  Sepsis   Critical care was time spent personally by me on the following activities:  Development of treatment plan with patient or surrogate, discussions with consultants, evaluation of patient's response to treatment, examination of patient, ordering and review of laboratory  studies, ordering and review of radiographic studies, ordering and performing treatments and interventions, pulse oximetry, re-evaluation of patient's condition and review of old charts   Care discussed with: admitting provider     Medications Ordered in ED Medications  lactated ringers infusion (has no administration in time  range)  lactated ringers bolus 1,000 mL (1,000 mLs Intravenous New Bag/Given 03/03/21 1529)    And  lactated ringers bolus 500 mL (has no administration in time range)  metroNIDAZOLE (FLAGYL) IVPB 500 mg (has no administration in time range)  vancomycin (VANCOREADY) IVPB 1000 mg/200 mL (has no administration in time range)  ceFEPIme (MAXIPIME) 2 g in sodium chloride 0.9 % 100 mL IVPB (has no administration in time range)  vancomycin (VANCOREADY) IVPB 1000 mg/200 mL (has no administration in time range)  acetaminophen (TYLENOL) tablet 650 mg (650 mg Oral Given 03/03/21 1342)  ceFEPIme (MAXIPIME) 2 g in sodium chloride 0.9 % 100 mL IVPB (2 g Intravenous New Bag/Given 03/03/21 1503)    ED Course  I have reviewed the triage vital signs and the nursing notes.  Pertinent labs & imaging results that were available during my care of the patient were reviewed by me and considered in my medical decision making (see chart for details).    MDM Rules/Calculators/A&P                           MDM:  Pt febrile, hypotensive and tachycardic.  Pt meets sepsis criteria.  Pt given Iv fluid bolus, antibiotics.  Pt has elevated WBC of 24.8.   Hospitalist consulted for admission  Final Clinical Impression(s) / ED Diagnoses Final diagnoses:  Sepsis, due to unspecified organism, unspecified whether acute organ dysfunction present ([place])  Fever, unspecified fever cause    Rx / DC Orders ED Discharge Orders     None        Sidney Ace 03/03/21 1853    Carmin Muskrat, MD 03/06/21 [phone]

## 2021-03-03 NOTE — Progress Notes (Addendum)
Subjective:    [name], female    DOB: 20-Nov-1970, 50 y.o.   MRN: [id number]  Chief Complaint: Fever   HPI Patient saw pulmonology on 02/24/21 and had a large lobular pleural effusion. She was placed on augmentin. Which she hs been taking for several days. The pulmonologist sent to to see oncology today. When she got to her oncology appointment they told her that they could not see her because she had a fever of 101. SHe came here to be seen for the fever. She has no other symptoms other then SOB which she always has.  According to care giver they are calling in another antibiotic for her. Consulting with DR. Dettinger- they think she has metastatic ovarian cancer. He says that patient as been very overwhelmed since diagnosisi  Review of Systems  Constitutional:  Positive for fever. Negative for activity change, appetite change, chills and fatigue.  HENT:  Negative for congestion and rhinorrhea.   Respiratory:  Negative for cough.   Musculoskeletal:  Negative for myalgias.  Neurological:  Negative for dizziness and headaches.  All other systems reviewed and are negative.     Objective:   Physical Exam Vitals reviewed.  Constitutional:      Appearance: Normal appearance.  Cardiovascular:     Rate and Rhythm: Regular rhythm. Tachycardia present.     Heart sounds: Normal heart sounds.  Pulmonary:     Effort: Pulmonary effort is normal.     Breath sounds: Normal breath sounds.     Comments: Gasping and moaning for air Skin:    [name]: Skin is warm.     Coloration: Skin is pale.  Neurological:     [name]: No focal deficit present.     Mental Status: She is alert and oriented to person, place, and time.  Psychiatric:        Mood and Affect: Mood is anxious.     Temp fluctuating- 97.8 - 100.8 skin  Oral temp 103.2 Flu negative HR 128 at rest 94% HR 148 with o2 sat of 84%- recovery HR 128  O2 sat88-90%  BP 109/60   Pulse (!) 126   Temp 97.9 F (36.6  C) (Temporal)   Resp 20   Ht 5' 4"  (1.626 m)   Wt 103 lb (46.7 kg)   SpO2 96%   BMI 17.68 kg/m     EKG- sinus tachycardia with occasional Margaret General, FNP  Assessment & Plan:   Margaret [name] in today with chief complaint of Fever   1. Fever in other diseases No fever in our office - Veritor Flu A/B Waived  2. Tachycardia - EKG 12-Lead  3. Respiratory distress Review of chart and patients assessment- needs to be transported to hospital. O2 at 2L vis nasal cannula Questioning possible PE  Transported via EMS to [facility]- I disagree with diverting her to [facility]. EMS says they have to take her either to [facility] who doe snot have CT scan. They will probably get diverted  [place]. Caregiver refuses [facility]. Caregiver facilty says she must go by ambulance to where ever they want to take her.  The above assessment and management plan was discussed with the patient. The patient verbalized understanding of and has agreed to the management plan. Patient is aware to call the clinic if symptoms persist or worsen. Patient is aware when to return to the clinic for a follow-up visit. Patient educated on when it is appropriate to go to the emergency department.  Mary-Margaret Hassell Done, FNP

## 2021-03-03 NOTE — Progress Notes (Signed)
Pharmacy Antibiotic Note  Margaret [name] is a 50 y.o. female admitted on 03/03/2021 with pneumonia.  Pharmacy has been consulted for Cefepime and Vancomycin dosing.  Plan: Cefepime 2gm IV q8h Vancomycin 1gm IV q24h Goal AUC 400-550. Expected AUC: 536 SCr used: 0.8 ( hers is 0.67) per protocol  F/u cxs and clinical progress Monitor V/S, labs and levels as indicated  Height: 5' 4"  (162.6 cm) Weight: 46.7 kg (103 lb) IBW/kg (Calculated) : 54.7  Temp (24hrs), Avg:100.4 F (38 C), Min:97.9 F (36.6 C), Max:102.9 F (39.4 C)  Recent Labs  Lab 03/03/21 1435  WBC 24.8*    Estimated Creatinine Clearance: 63.4 mL/min (by C-G formula based on SCr of 0.65 mg/dL).    No Known Allergies  Antimicrobials this admission: Vancomycin  11/30 >>  Cefepime 11/30 >>   Microbiology results: 11/30 BCx: pending 11/30 UCx: TBC   MRSA PCR:   Thank you for allowing pharmacy to be a part of this patient's care.  Margaret [name], BS Margaret [name], [place] Clinical Pharmacist Pager [phone] 03/03/2021 2:55 PM

## 2021-03-03 NOTE — Telephone Encounter (Addendum)
The patient presented today to see me for a new patient exam for a small, 3 cm cystic mass noted as an incidental finding on CT scan in mid October during hospitalization.  CA-125 was checked and mildly elevated (111) while she was in the hospital being treated for aspiration pneumonia.  No pelvic ultrasound has been performed.  Notes from hospitalization indicate referral to gynecology however referral was sent to GYN oncology.  Patient was unable to be contacted by my office due to phone numbers in chart prior to her visit for history review and meaningful use.  When she arrived in clinic, she was noted to be febrile with a temperature both temporal and oral of 101.9 F and a heart rate in the 120s.  In the setting of recently treated pneumonia with increasing white blood cell count at the time of labs checked on 11/23, recommended that patient present to urgent care for further infectious work-up.  Patient is scheduled to see her primary care provider today at 1130 and will keep this visit.  We will plan to reschedule the patient in several weeks.  Phone numbers were confirmed with her today so that we are able to contact her before her next visit.  Jeral Pinch MD Gynecologic Oncology

## 2021-03-03 NOTE — H&P (Signed)
History and Physical    Margaret [name] ASN:[id number] DOB: 31-Jul-1970 DOA: 03/03/2021  PCP: Dettinger, Fransisca Kaufmann, MD   Patient coming from: [place] have personally briefly reviewed patient's old medical records in [place]  Chief Complaint: Fever  HPI: Margaret [name] is a 50 y.o. female with medical history significant for  schizophrenia/impulse disorder/intellectual disability, history of TB who is a resident of a group home and has an appointed guardian.  Patient was brought to the ED reports of fever and flulike symptoms.  Patient is awake alert and oriented x3, but history is limited from patient due to intellectual disability.  She reports cough.  She denies difficulty breathing.  No chest pain.  She is able to answer simple questions appropriately.  No response when I called group home contact number listed on demographics or patient's listed guardian to get additional history.  Recent prolonged hospitalization-10/28 to 11/17-for recurrent aspiration pneumonia with right empyema.  Transferred from [facility] to [facility] under PCCM service.  CT showed a large loculated right pleural effusion.  Required chest tube placement and fibrinolytics.  Chest tube was removed, but patient had enlarging pleural effusion which required pigtail catheter.  She was discharged on Augmentin to complete 21 days total, last dose 02/26/2021.  ED Course: Febrile to 102.9.  Tachycardic [id number].  Respiratory rate 12-27.  Blood pressure systolic 19-379.  O2 sats greater than 92% on room air.  WBC 24.8.  Lactic acid 0.8.  X-ray shows heterogeneous airspace opacity throughout the lungs and small bilateral pleural effusions.  Influenza test negative.  Blood cultures obtained.  Patient started on Vanco and cefepime.  Hospitalist to admit.  Review of Systems: As per HPI all other systems reviewed and negative.  Past Medical History:  Diagnosis Date   Hematuria 11/12/2014   Hepatitis B carrier [facility])     Hypothyroidism    Impulse disorder    IUD (intrauterine device) in place 11/12/2014   Inserted 12/24/12 at [facility]   Mild intellectual disability    Schizophrenia ([place])    Schizophrenia ([place])    Tuberculosis    as a child   Urinary frequency 11/12/2014    Past Surgical History:  Procedure Laterality Date   ARM WOUND REPAIR / CLOSURE     TRACHEAL SURGERY     TRACHEOSTOMY       reports that she has never smoked. She has never used smokeless tobacco. She reports that she does not drink alcohol and does not use drugs.  No Known Allergies  Family History  Adopted: Yes  Family history unknown: Yes    Prior to Admission medications   Medication Sig Start Date End Date Taking? Authorizing Provider  acetaminophen (TYLENOL) 650 MG CR tablet Take 650 mg by mouth every 8 (eight) hours as needed for pain.    [provider]  albuterol (VENTOLIN HFA) 108 (90 Base) MCG/ACT inhaler INHALE 2 PUFFS EVERY 4 HOURS AS NEEDED FOR WHEEZING. 07/30/20   Dettinger, Fransisca Kaufmann, MD  benztropine (COGENTIN) 1 MG tablet Take 1 tablet (1 mg total) by mouth 2 (two) times daily. 01/08/20   Dettinger, Fransisca Kaufmann, MD  chlorproMAZINE (THORAZINE) 50 MG tablet Take 50 mg by mouth as needed. One tablet by mouth PRN agitation, max of 2 tablets, 100 mg in 24 hours    [provider]  Cholecalciferol (VITAMIN D3) 50 MCG (2000 UT) capsule TAKE 1 CAPSULE BY MOUTH EVERY MORNING. 06/11/20   Dettinger, Fransisca Kaufmann, MD  cloZAPine (CLOZARIL)  100 MG tablet Take 200 mg by mouth at bedtime.    [provider]  entecavir (BARACLUDE) 0.5 MG tablet TAKE (1) TABLET BY MOUTH ONCE DAILY. 09/02/20   Harvel Quale, MD  feeding supplement, ENSURE COMPLETE, (ENSURE COMPLETE) LIQD Take 237 mLs by mouth 2 (two) times daily between meals. 02/24/21   Martyn Ehrich, NP  Fish Oil-Cholecalciferol (FISH OIL + D3) 1200-1000 MG-UNIT CAPS TAKE 1 CAPSULE BY MOUTH TWICE DAILY. 03/05/20   Dettinger, Fransisca Kaufmann, MD  FLUoxetine  (PROZAC) 20 MG capsule TAKE 3 CAPSULES BY MOUTH ONCE DAILY. 12/03/20   Dettinger, Fransisca Kaufmann, MD  fluticasone (FLONASE) 50 MCG/ACT nasal spray SPRAY 2 SPRAYS IN RIGHT NOSTRIL ONLY TWICE DAILY. 10/04/19   Dettinger, Fransisca Kaufmann, MD  guaiFENesin (MUCINEX) 600 MG 12 hr tablet Take 1 tablet (600 mg total) by mouth 2 (two) times daily. 03/01/21 03/01/22  Martyn Ehrich, NP  levofloxacin (LEVAQUIN) 500 MG tablet Take 1 tablet (500 mg total) by mouth daily. 03/03/21   Martyn Ehrich, NP  levonorgestrel (MIRENA) 20 MCG/24HR IUD 1 each by Intrauterine route once.    [provider]  mirtazapine (REMERON) 7.5 MG tablet Take 7.5 mg by mouth at bedtime. 10/28/20   [provider]  Multiple Vitamins-Minerals (CENTROVITE) TABS TAKE 1 TABLET BY MOUTH AT BEDTIME. 05/04/20   Dettinger, Fransisca Kaufmann, MD  polyethylene glycol powder (GLYCOLAX/MIRALAX) 17 GM/SCOOP powder Take 17 g by mouth daily. Patient taking differently: Take 17 g by mouth daily as needed. 01/21/21   Rehman, Mechele Dawley, MD  senna-docusate (SENOKOT-S) 8.6-50 MG tablet Take 1 tablet by mouth 2 (two) times daily between meals as needed for mild constipation. 02/18/21   Mercy Riding, MD  Skin Protectants, Misc. (MINERIN CREME) CREA APPLY MODERATE AMOUNT TOPICALLY TO DRY SKIN ON SOLES & HEELS OF BOTH FEET AFTER SHOWER. (NURSING STAFF TO SUPERVISE APPLICATION) 27/78/24   Dettinger, Fransisca Kaufmann, MD  vitamin E 200 UNIT capsule Take 1 capsule (200 Units total) by mouth daily. 05/13/20   Dettinger, Fransisca Kaufmann, MD    Physical Exam: Vitals:   03/03/21 1528 03/03/21 1530 03/03/21 1532 03/03/21 1600  BP:  (!) 93/54  (!) 92/50  Pulse:  (!) 105 (!) 105 (!) 101  Resp:  (!) 27 (!) 21 19  Temp: 99.1 F (37.3 C)     TempSrc: Oral     SpO2:  93% 92% 96%  Weight:      Height:        Constitutional: NAD, calm, comfortable Vitals:   03/03/21 1528 03/03/21 1530 03/03/21 1532 03/03/21 1600  BP:  (!) 93/54  (!) 92/50  Pulse:  (!) 105 (!) 105 (!) 101  Resp:   (!) 27 (!) 21 19  Temp: 99.1 F (37.3 C)     TempSrc: Oral     SpO2:  93% 92% 96%  Weight:      Height:       Eyes: PERRL, lids and conjunctivae normal ENMT: Mucous membranes are dry Neck: normal, supple, no masses, no thyromegaly Respiratory: clear to auscultation bilaterally, no wheezing, no crackles. Normal respiratory effort. No accessory muscle use.  Cardiovascular: Tachycardic, regular rate and rhythm, no murmurs / rubs / gallops. No extremity edema. 2+ pedal pulses.   Abdomen: no tenderness, no masses palpated. No hepatosplenomegaly. Bowel sounds positive.  Musculoskeletal: no clubbing / cyanosis. No joint deformity upper and lower extremities. Good ROM, no contractures. Normal muscle tone.  Skin: no rashes, lesions, ulcers. No induration  Neurologic: No apparent cranial nerve abnormality.  Continuous moving her right upper extremity, appears involuntary. Psychiatric: Normal judgment and insight. Alert and oriented x 3. Normal mood.   Labs on Admission: I have personally reviewed following labs and imaging studies  CBC: Recent Labs  Lab 03/03/21 1435  WBC 24.8*  NEUTROABS 22.1*  HGB 9.1*  HCT 29.5*  MCV 88.1  PLT 333   Basic Metabolic Panel: Recent Labs  Lab 03/03/21 1435  NA 133*  K 3.7  CL 101  CO2 23  GLUCOSE 108*  BUN 12  CREATININE 0.48  CALCIUM 8.4*   GFR: Estimated Creatinine Clearance: 63.4 mL/min (by C-G formula based on SCr of 0.48 mg/dL). Liver Function Tests: Recent Labs  Lab 03/03/21 1435  AST 17  ALT 16  ALKPHOS 87  BILITOT 0.5  PROT 6.4*  ALBUMIN 2.7*   Coagulation Profile: Recent Labs  Lab 03/03/21 1435  INR 1.2    Radiological Exams on Admission: DG Chest Portable 1 View  Result Date: 03/03/2021 CLINICAL DATA:  Fever, recent diagnosis pneumonia EXAM: PORTABLE CHEST 1 VIEW COMPARISON:  02/24/2021 FINDINGS: The heart size and mediastinal contours are within normal limits. Similar appearance of small bilateral pleural effusions.  Unchanged heterogeneous airspace opacity throughout the lungs. Plate and screw fixation of the left clavicle. IMPRESSION: Unchanged heterogeneous airspace opacity throughout the lungs and small bilateral pleural effusions. No new airspace opacity. Electronically Signed   By: Delanna Ahmadi M.D.   On: 03/03/2021 14:15    EKG: Independently reviewed.  Sinus or ectopic atrial tachycardia.  Rate 110.  QTc 477.  Artifacts present.  Assessment/Plan Principal Problem:   Multifocal pneumonia Active Problems:   Schizophrenia ([place])   Complex ovarian cyst   Sepsis ([place])   Intellectual disability   Sepsis with multifocal pneumonia - Rules in for sepsis, febrile 102.9, tachycardic 99-123.  Respiratory rate 12-27.  With significant leukocytosis of 24.8.  Normal lactic acid 0.8.  Recent prolonged hospitalization for aspiration pneumonia with right empyema, requiring chest tube, fibrinolytics and later a pigtail catheter.  COVID and influenza negative. -Initial chest x-ray a week ago and today showing peripheral right midlung and left lung opacities concerning for multifocal pneumonia.   -Patient is supposed to be completing 21 days of Augmentin 11/25. -Continue IV vancomycin and cefepime for now -Follow-up blood cultures -Obtain UA, urine cultures -Considering recent history and significant leukocytosis, obtain CT chest with contrast/CTA -Admit to [place] for CT, if no beds and patient is still in ED, and CT is now available at [facility] she can be admitted here pending CT findings. -1.5 L bolus given, continue normal saline 100 cc/hr x 20hrs  Schizophrenia,  intellectual disability-group home resident.  Also has persistent arhythmic movement of her right upper extremity, I am unable to confirm if this is new. -Resume clozapine, promethazine, hydroxyzine, Prozac  Complex right ovarian cyst, right adrenal mass-EEG was also elevated.  Patient was to follow-up with oncology and gynecology on  discharge.   - Saw Gyn-Onc today, note pending.  Protein energy malnutrition, BMI 17.68 -Nutritional supplements  DVT prophylaxis: Lovenox Code Status: Full code Family Communication: I am unable to reach patient's listed legal guardian or group home. Disposition Plan: ~ 2 days Consults called: None Admission status: Inpt tele I certify that at the point of admission it is my clinical judgment that the patient will require inpatient hospital care spanning beyond 2 midnights from the point of admission due to high intensity of service, high  risk for further deterioration and high frequency of surveillance required.    Bethena Roys MD [facility]  03/03/2021, 10:46 PM

## 2021-03-03 NOTE — ED Notes (Signed)
Plan for pt to transfer to [facility] per Dr. Denton Brick d/t pt needing ct scan

## 2021-03-03 NOTE — Progress Notes (Signed)
Thanks

## 2021-03-03 NOTE — Sepsis Progress Note (Signed)
ELink tracking the code Sepsis.

## 2021-03-04 ENCOUNTER — Other Ambulatory Visit: Payer: Self-pay | Admitting: Family Medicine

## 2021-03-04 ENCOUNTER — Inpatient Hospital Stay (HOSPITAL_COMMUNITY): Payer: Medicare Other

## 2021-03-04 ENCOUNTER — Ambulatory Visit (HOSPITAL_COMMUNITY): Payer: Medicare Other | Admitting: Hematology

## 2021-03-04 ENCOUNTER — Encounter (HOSPITAL_COMMUNITY): Payer: Self-pay | Admitting: Internal Medicine

## 2021-03-04 DIAGNOSIS — N9489 Other specified conditions associated with female genital organs and menstrual cycle: Secondary | ICD-10-CM | POA: Diagnosis not present

## 2021-03-04 DIAGNOSIS — J189 Pneumonia, unspecified organism: Secondary | ICD-10-CM | POA: Diagnosis not present

## 2021-03-04 DIAGNOSIS — R509 Fever, unspecified: Secondary | ICD-10-CM | POA: Diagnosis not present

## 2021-03-04 LAB — BASIC METABOLIC PANEL
Anion gap: 7 (ref 5–15)
BUN: 10 mg/dL (ref 6–20)
CO2: 20 mmol/L — ABNORMAL LOW (ref 22–32)
Calcium: 7.6 mg/dL — ABNORMAL LOW (ref 8.9–10.3)
Chloride: 101 mmol/L (ref 98–111)
Creatinine, Ser: 0.38 mg/dL — ABNORMAL LOW (ref 0.44–1.00)
GFR, Estimated: >60 mL/min (ref >60)
Glucose, Bld: 140 mg/dL — ABNORMAL HIGH (ref 70–99)
Potassium: 3.3 mmol/L — ABNORMAL LOW (ref 3.5–5.1)
Sodium: 128 mmol/L — ABNORMAL LOW (ref 135–145)

## 2021-03-04 LAB — CBC
HCT: 26.7 % — ABNORMAL LOW (ref 36.0–46.0)
Hemoglobin: 8.4 g/dL — ABNORMAL LOW (ref 12.0–15.0)
MCH: 27.3 pg (ref 26.0–34.0)
MCHC: 31.5 g/dL (ref 30.0–36.0)
MCV: 86.7 fL (ref 80.0–100.0)
Platelets: 238 10*3/uL (ref 150–400)
RBC: 3.08 MIL/uL — ABNORMAL LOW (ref 3.87–5.11)
RDW: 16.7 % — ABNORMAL HIGH (ref 11.5–15.5)
WBC: 23.4 10*3/uL — ABNORMAL HIGH (ref 4.0–10.5)
nRBC: 0 % (ref 0.0–0.2)

## 2021-03-04 LAB — URINALYSIS, ROUTINE W REFLEX MICROSCOPIC
Bilirubin Urine: NEGATIVE
Glucose, UA: NEGATIVE mg/dL
Hgb urine dipstick: NEGATIVE
Ketones, ur: 20 mg/dL — AB
Nitrite: NEGATIVE
Protein, ur: NEGATIVE mg/dL
Specific Gravity, Urine: 1.018 (ref 1.005–1.030)
pH: 5 (ref 5.0–8.0)

## 2021-03-04 LAB — PREGNANCY, URINE: Preg Test, Ur: NEGATIVE

## 2021-03-04 LAB — MRSA NEXT GEN BY PCR, NASAL: MRSA by PCR Next Gen: NOT DETECTED

## 2021-03-04 IMAGING — US US PELVIS COMPLETE
1 series · 9 of 9 positions shown · non-contrast
Comparison: None.

CLINICAL DATA: Adnexal mass seen on CT [DATE]

EXAM:
TRANSABDOMINAL ULTRASOUND OF PELVIS
TECHNIQUE: Transabdominal ultrasound examination of the pelvis was performed
including evaluation of the uterus, ovaries, adnexal regions, and
pelvic cul-de-sac.

[Series 1: us pelvis complete mc & wl · 9 acquisitions, 9 frames shown]
[im 1/9]
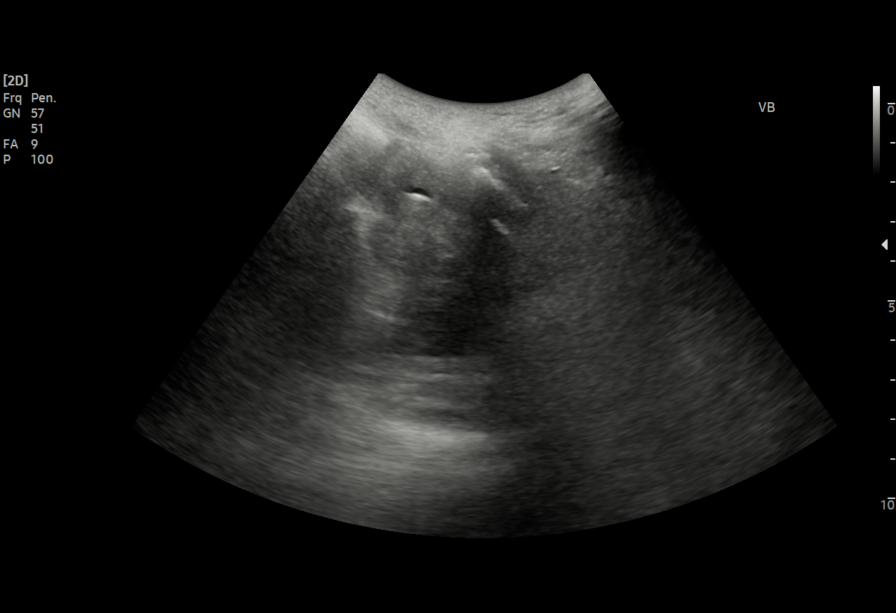
[im 2/9]
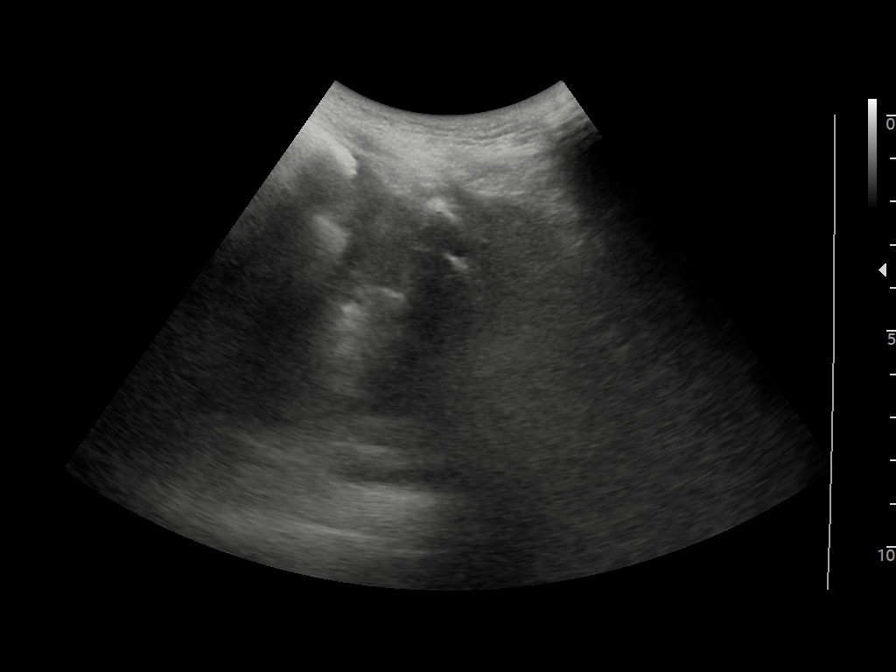
[im 3/9]
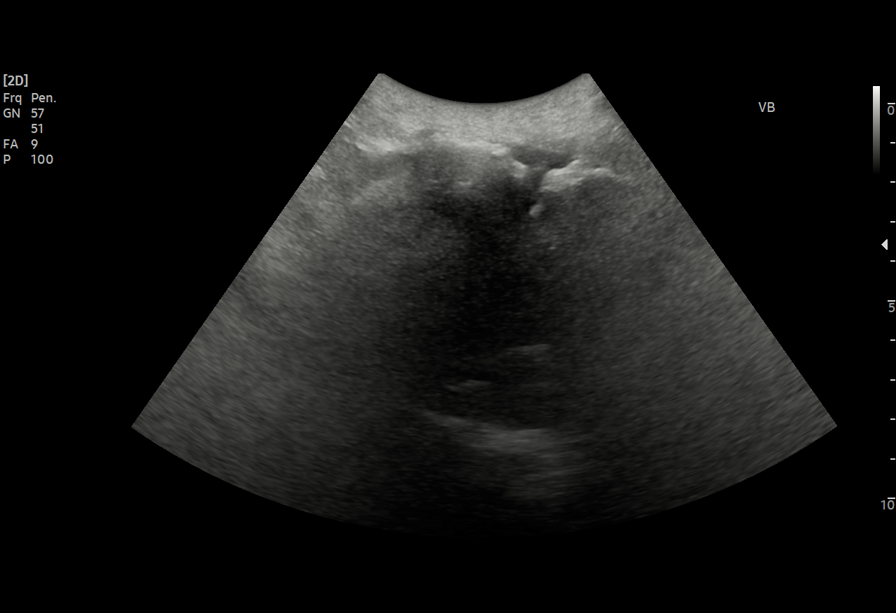
[im 4/9]
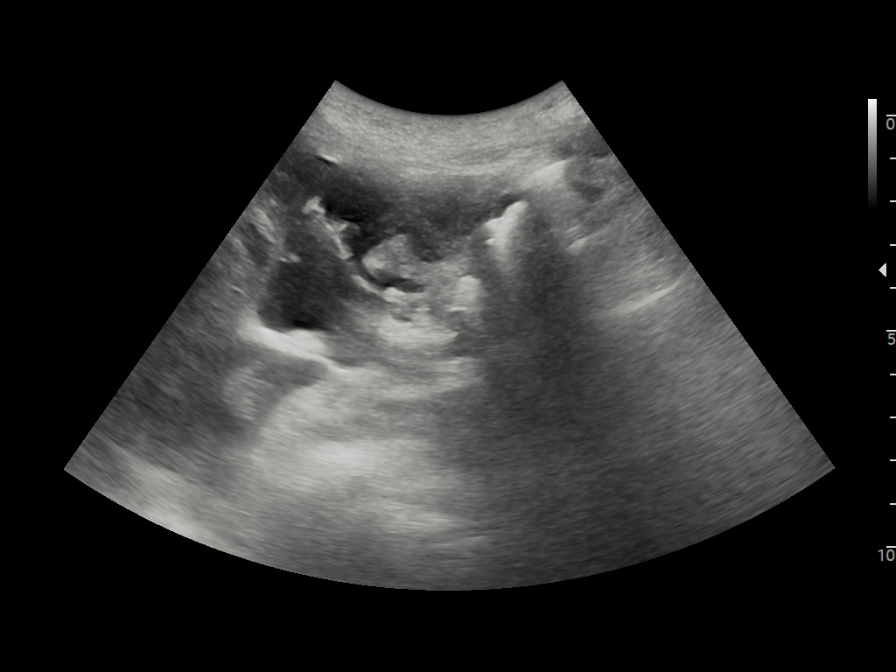
[im 5/9]
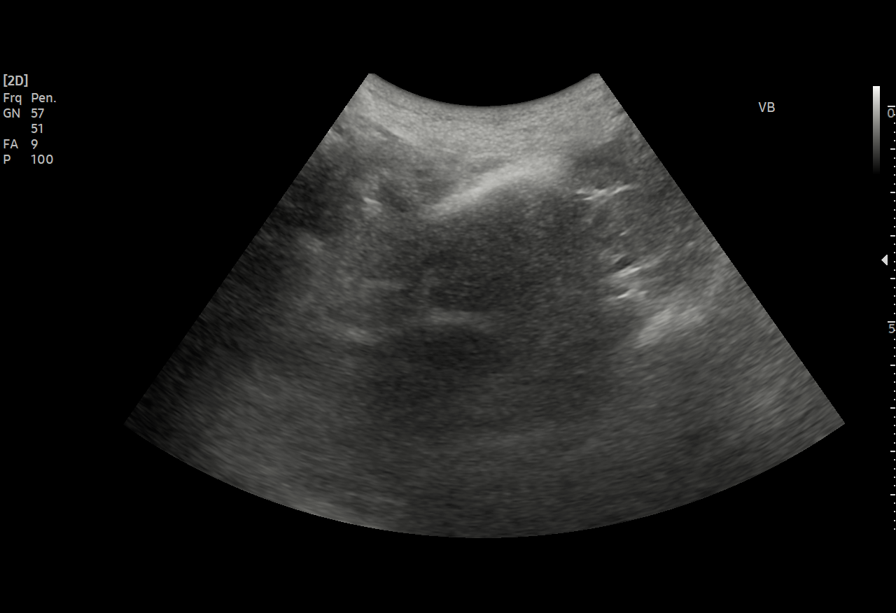
[im 6/9]
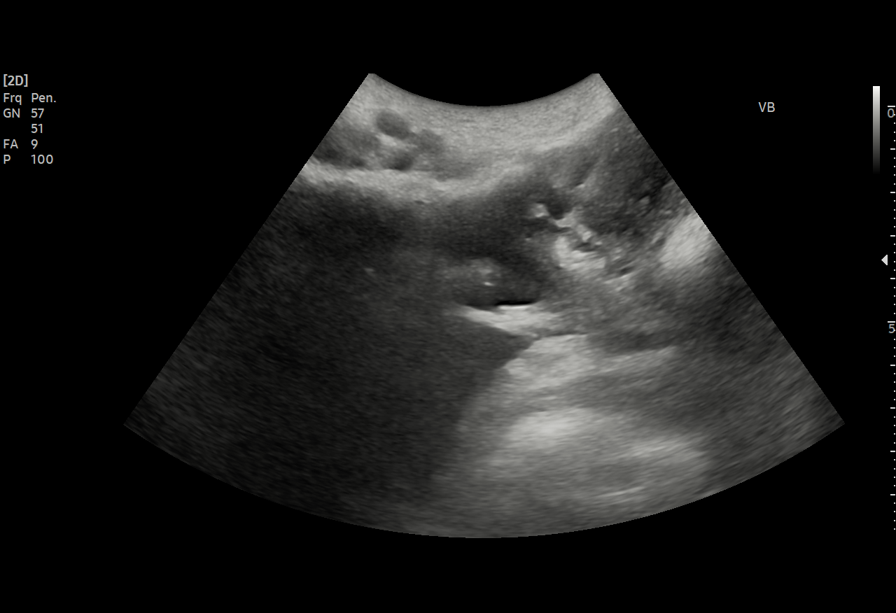
[im 7/9]
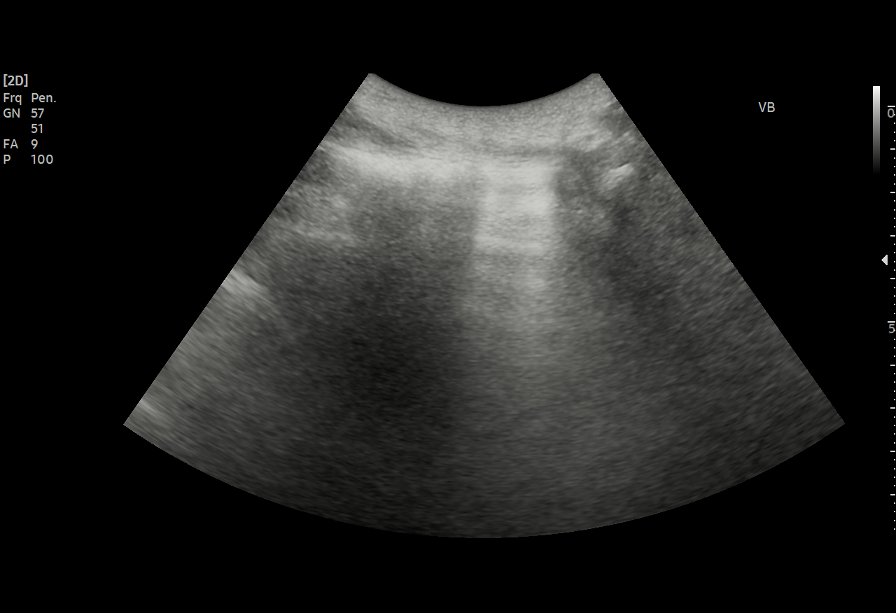
[im 8/9]
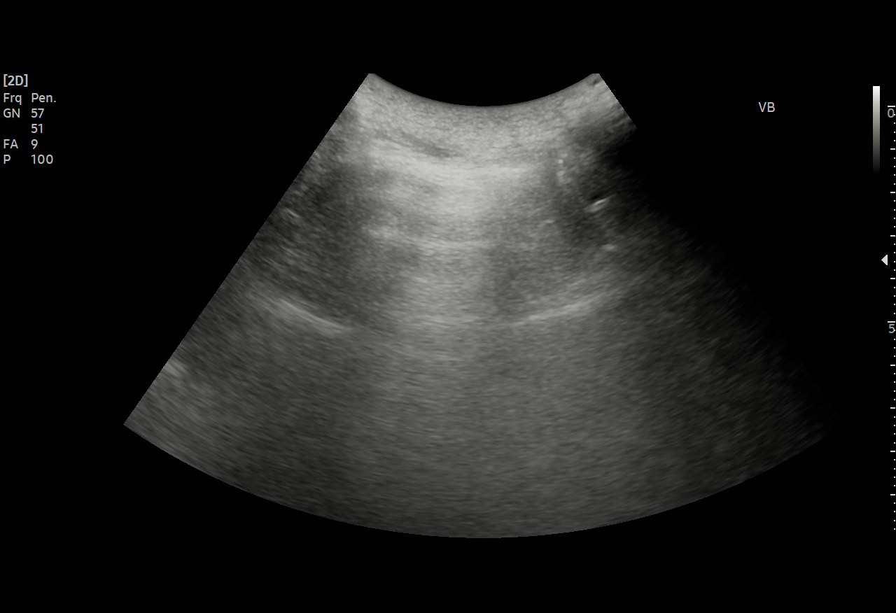
[im 9/9]
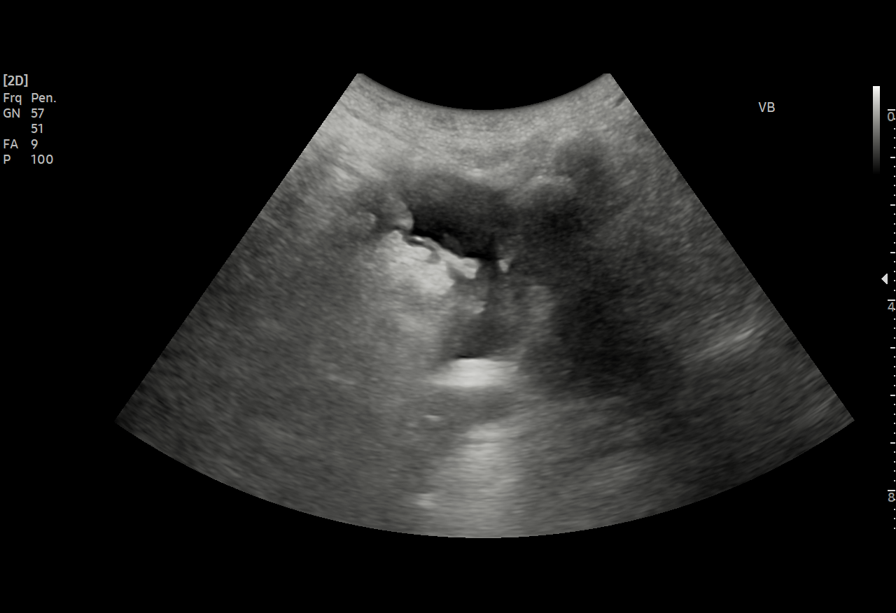

[9 of 9 positions shown; findings below may reference images not displayed]

FINDINGS: Evaluation is markedly limited due to patient incontinence and
unable to fill bladder, and inability to consent for transvaginal
exam.

Uterus

Not identified.

Endometrium

Not identified.

Right ovary

Not identified.

Left ovary

Not identified.

Other findings:  No definite free fluid was seen.
IMPRESSION: Limited exam due to patient incontinence and inability to fill
bladder, and inability to consent for transvaginal exam. The uterus
and bilateral ovaries could not be identified. Consider pelvic
nonemergent MRI with and without contrast to better evaluate the
right adnexal lesion seen on prior CT.

## 2021-03-04 IMAGING — CT CT CHEST W/ CM
2 of 4 series · 15 of 36 positions shown, 18 images · IV contrast (OMNIPAQUE)
Comparison: [DATE]

CLINICAL DATA: Follow-up abnormal chest plain film findings.

EXAM:
CT CHEST WITH CONTRAST
TECHNIQUE: Multidetector CT imaging of the chest was performed during
intravenous contrast administration.
CONTRAST:  60mL OMNIPAQUE IOHEXOL 350 MG/ML SOLN

[Series 2: axial st · axial · 0.59mm/px · z∈[+1378,+1612]mm · 12 of 139 slices shown, 15 images]
[im 11/139  mediastinal]
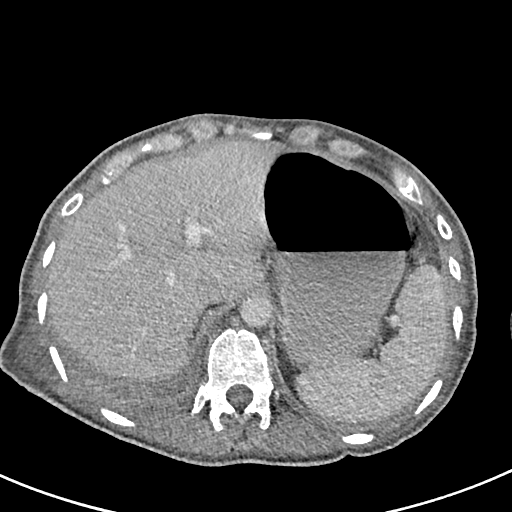
[im 11/139  lung]
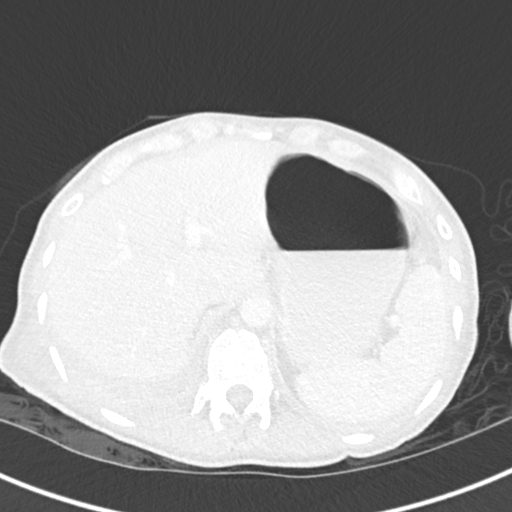
[im 22/139  lung]
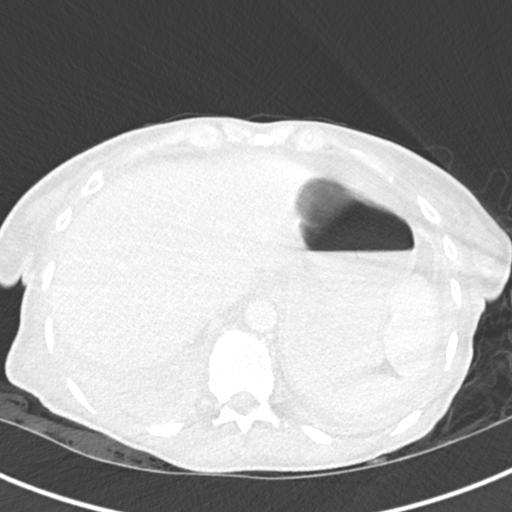
[im 32/139  lung]
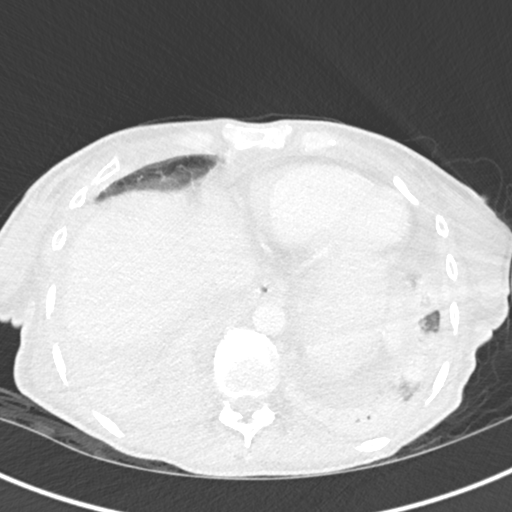
[im 43/139  lung]
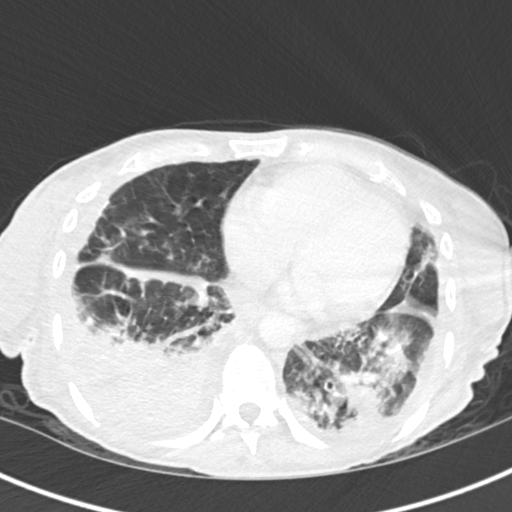
[im 54/139  mediastinal]
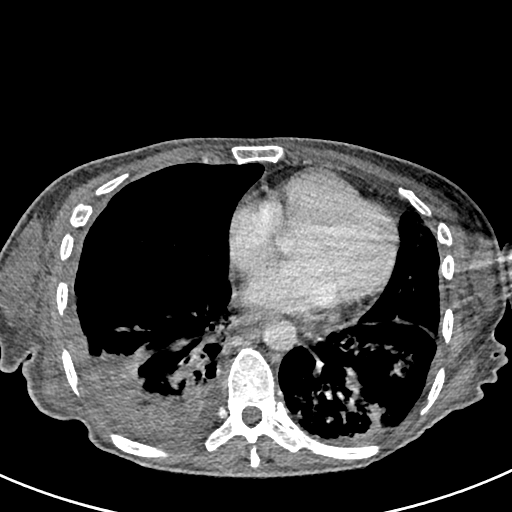
[im 54/139  lung]
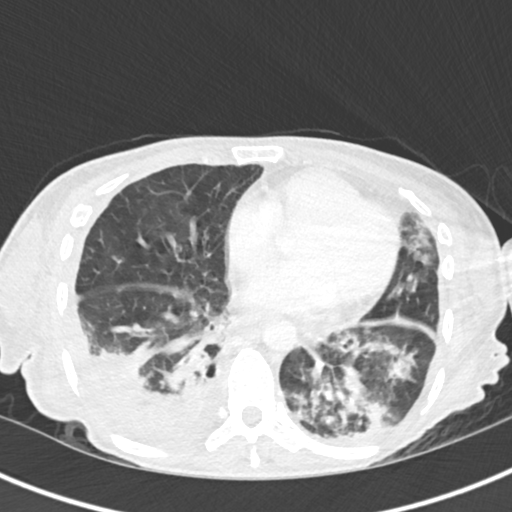
[im 64/139  lung]
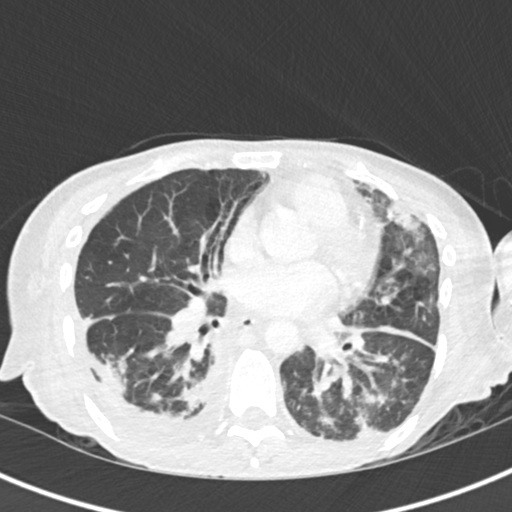
[im 75/139  lung]
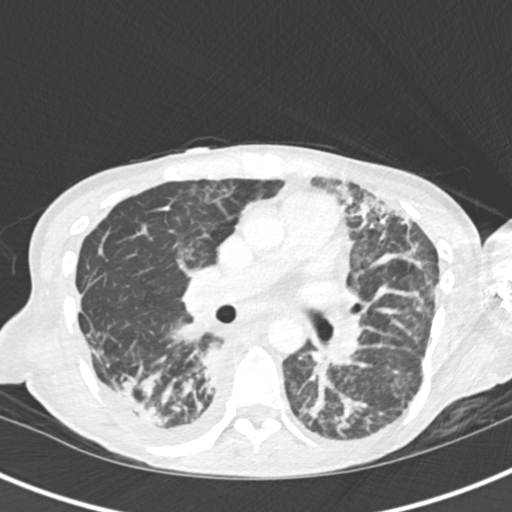
[im 85/139  lung]
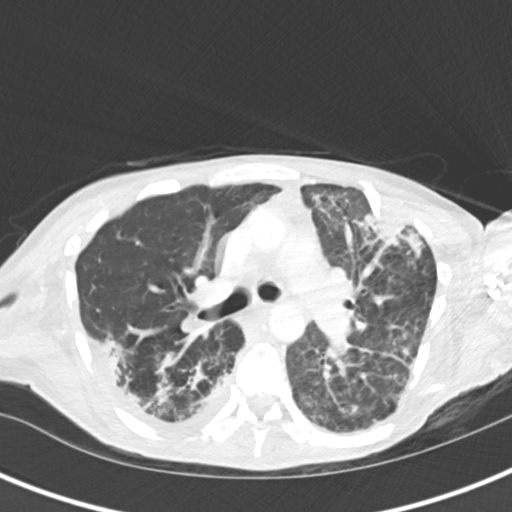
[im 96/139  mediastinal]
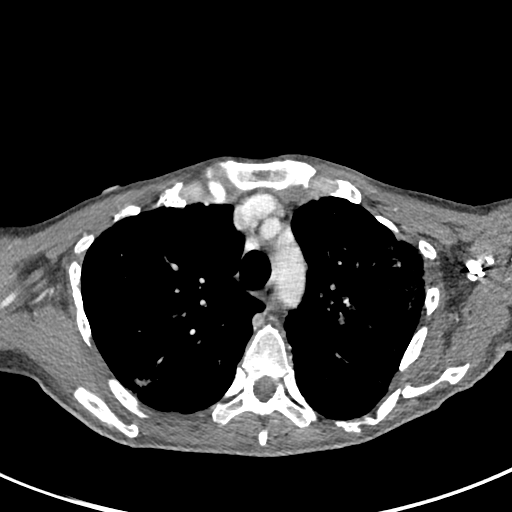
[im 96/139  lung]
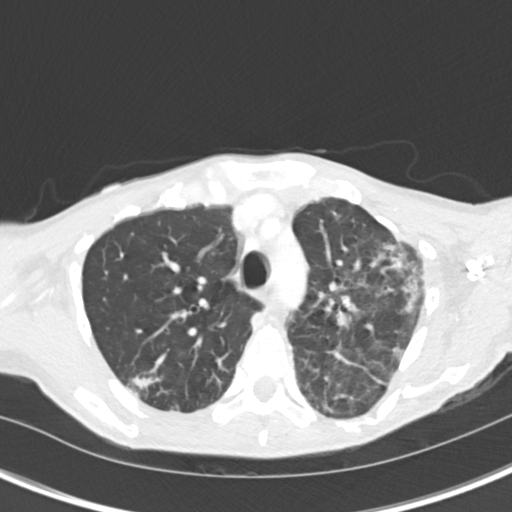
[im 107/139  lung]
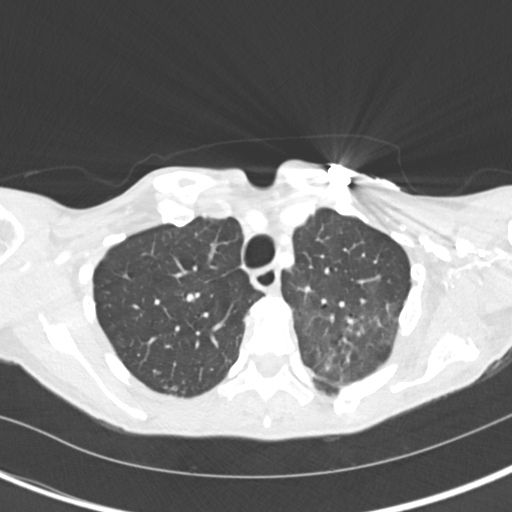
[im 117/139  lung]
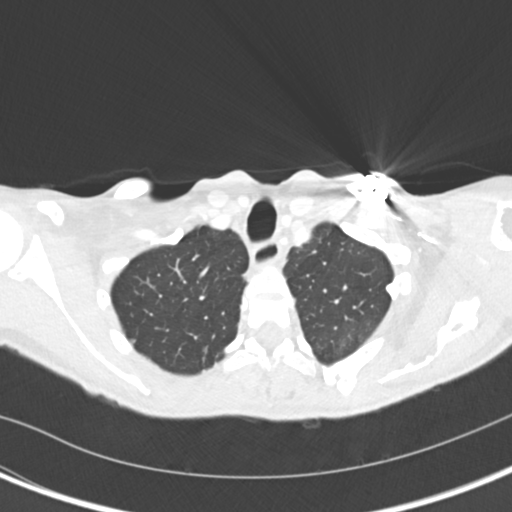
[im 128/139  lung]
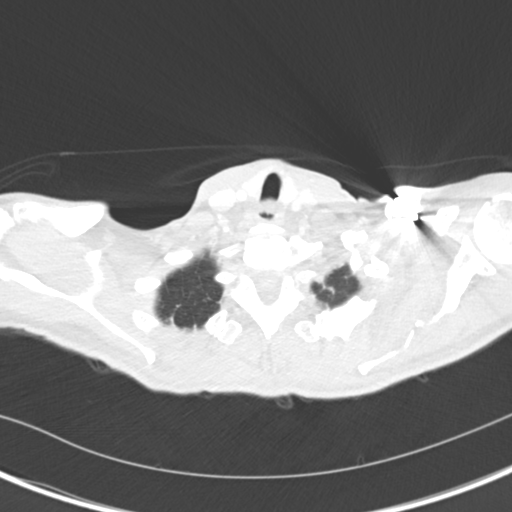

[Series 6: coronal · coronal · 0.55mm/px · 3 of 104 slices shown]
[im 21/104  lung]
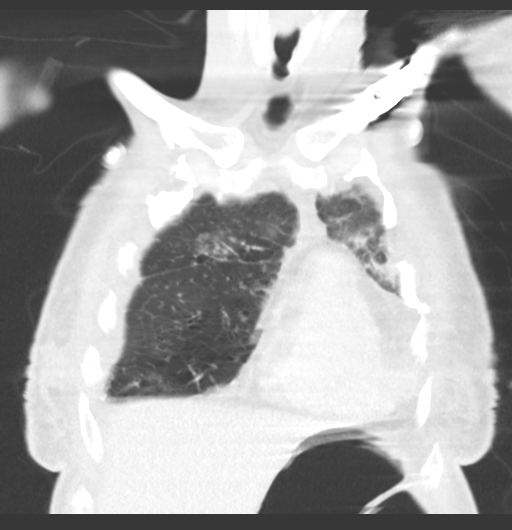
[im 42/104  lung]
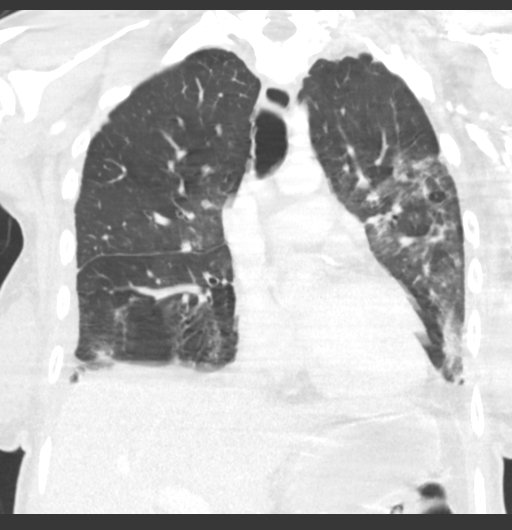
[im 62/104  lung]
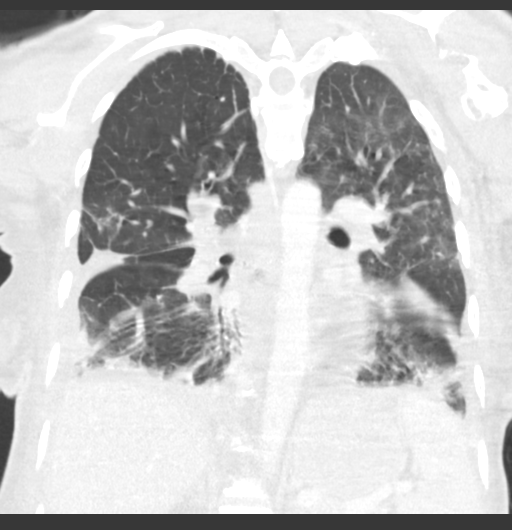

[15 of 36 positions shown; findings below may reference images not displayed]

FINDINGS: Cardiovascular: No significant vascular findings. Normal heart size
with moderate to marked severity coronary artery calcification. No
pericardial effusion.

Mediastinum/Nodes: Subcentimeter calcified subcarinal lymph nodes
are seen. Thyroid gland, trachea, and esophagus demonstrate no
significant findings.

Lungs/Pleura: Moderate to marked severity areas of atelectasis
and/or infiltrate are seen within the bilateral upper lobes and
bilateral lower lobes. This is decreased in severity within the left
upper lobe, and increased in severity within the right upper lobe
and left lower lobe when compared to the prior study. Involvement of
the right lower lobe is family stable.

Small bilateral pleural effusions are seen, right slightly greater
than left.

No pneumothorax is identified.

Upper Abdomen: No acute abnormality.

Musculoskeletal: Multiple chronic left-sided rib fractures are seen.

A metallic density fixation plate and screws are seen along the left
clavicle.

Mild, multilevel degenerative changes are noted throughout the
thoracic spine.

Other: The study is limited secondary to the presence of beam
hardening artifact from a metallic density fixation plate and screws
present within the mid to distal left humeral shaft.
IMPRESSION: 1. Moderate to marked severity bilateral upper lobe and bilateral
lower lobe atelectasis and/or infiltrate, as described above.
2. Small bilateral pleural effusions, right slightly greater than
left.
3. Moderate to marked severity coronary artery calcification.

## 2021-03-04 MED ORDER — LACTATED RINGERS IV BOLUS
500.0000 mL | Freq: Once | INTRAVENOUS | Status: AC
  Administered 2021-03-04: 500 mL via INTRAVENOUS

## 2021-03-04 MED ORDER — IOHEXOL 350 MG/ML SOLN
60.0000 mL | Freq: Once | INTRAVENOUS | Status: AC | PRN
  Administered 2021-03-04: 60 mL via INTRAVENOUS

## 2021-03-04 MED ORDER — SODIUM CHLORIDE (PF) 0.9 % IJ SOLN
INTRAMUSCULAR | Status: AC
  Filled 2021-03-04: qty 50

## 2021-03-04 MED ORDER — HYDROXYZINE HCL 25 MG PO TABS
25.0000 mg | ORAL_TABLET | Freq: Three times a day (TID) | ORAL | Status: DC | PRN
  Administered 2021-03-05 – 2021-03-07 (×4): 25 mg via ORAL
  Filled 2021-03-04 (×5): qty 1

## 2021-03-04 MED ORDER — POTASSIUM CHLORIDE CRYS ER 20 MEQ PO TBCR
40.0000 meq | EXTENDED_RELEASE_TABLET | Freq: Once | ORAL | Status: AC
  Administered 2021-03-04: 40 meq via ORAL
  Filled 2021-03-04: qty 2

## 2021-03-04 MED ORDER — SODIUM CHLORIDE 0.9 % IV BOLUS
500.0000 mL | Freq: Once | INTRAVENOUS | Status: AC
  Administered 2021-03-04: 500 mL via INTRAVENOUS

## 2021-03-04 NOTE — Progress Notes (Signed)
PROGRESS NOTE    Margaret [name]  XWR:[id number] DOB: [birth year] DOA: 03/03/2021 PCP: Dettinger, Fransisca Kaufmann, MD   Chief Complaint  Patient presents with   Fever    Brief Narrative:   50 year old lady with prior history of schizophrenia/impulse disorder,/intellectual disability, history of tuberculosis as a child, patient is a resident of group home, has an appointed guardian recently treated for recurrent aspiration pneumonia and empyema was discharged on Augmentin to complete the course on February 26, 2021 presents last night for fevers.  Assessment & Plan:   Principal Problem:   Multifocal pneumonia Active Problems:   Schizophrenia ([place])   Complex ovarian cyst   Sepsis ([place])   Intellectual disability   Sepsis with multifocal pneumonia Patient came in febrile, tachycardic, tachypneic with significant leukocytosis, normal lactic acid. Recent prolonged hospitalization for aspiration pneumonia with right empyema requiring chest tube fibrinolytics and a pigtail catheter. Currently COVID and influenza negative. Chest x-ray showed multifocal pneumonia CTA of the chest shows Started empirically on broad-spectrum antibiotics and transition to IV cefepime. Follow-up blood cultures and urine cultures.     Schizophrenia with history of intellectual disability Resume home medications.     Complex right ovarian cyst/right adnexal mass OB/GYN on board, recommended ultrasound of the pelvis which has been ordered and pending at this time.  Severe protein calorie malnutrition Dietary will be consulted    Hypokalemia Replaced    Hyponatremia ?  Recheck sodium levels in the morning. If persistently low will check for SIADH.    DVT prophylaxis: (Lovenox) Code Status: Full code Family Communication: None at bedside Disposition:   Status is: Inpatient  Remains inpatient appropriate because: Antibiotics for pneumonia       Consultants:  None Procedures: none.   Antimicrobials:  Antibiotics Given (last 72 hours)     Date/Time Action Medication Dose Rate   03/03/21 1503 New Bag/Given   ceFEPIme (MAXIPIME) 2 g in sodium chloride 0.9 % 100 mL IVPB 2 g 200 mL/hr   03/03/21 1605 New Bag/Given   vancomycin (VANCOREADY) IVPB 1000 mg/200 mL 1,000 mg 200 mL/hr   03/03/21 1610 New Bag/Given   metroNIDAZOLE (FLAGYL) IVPB 500 mg 500 mg 100 mL/hr   03/03/21 2150 New Bag/Given   ceFEPIme (MAXIPIME) 2 g in sodium chloride 0.9 % 100 mL IVPB 2 g 200 mL/hr   03/04/21 0509 New Bag/Given   ceFEPIme (MAXIPIME) 2 g in sodium chloride 0.9 % 100 mL IVPB 2 g 200 mL/hr   03/04/21 1346 New Bag/Given   ceFEPIme (MAXIPIME) 2 g in sodium chloride 0.9 % 100 mL IVPB 2 g 200 mL/hr        Subjective: Patient denies any chest pain, breathing has improved still requiring up to 2 L of nasal cannula oxygen  Objective: Vitals:   03/04/21 0507 03/04/21 0549 03/04/21 0644 03/04/21 1055  BP: (!) 85/51 (!) 84/46 (!) 87/45 (!) 93/55  Pulse: 91 89 88 99  Resp: 18 18 20  (!) 28  Temp: 98.2 F (36.8 C) 98.3 F (36.8 C) 98.2 F (36.8 C) 97.8 F (36.6 C)  TempSrc: Axillary Oral Oral Oral  SpO2: 94% 97% 96% 99%  Weight:      Height:        Intake/Output Summary (Last 24 hours) at 03/04/2021 1404 Last data filed at 03/04/2021 1028 Gross per 24 hour  Intake 2919.26 ml  Output 850 ml  Net 2069.26 ml   Filed Weights   03/03/21 1435 03/03/21 1522 03/04/21 0255  Weight: 46.7 kg 46.7  kg 50.5 kg    Examination:  General exam: Appears calm and comfortable  Respiratory system: Diminished air entry at bases on 2 L of nasal cannula oxygen. Cardiovascular system: S1 & S2 heard, RRR. No JVD,  No pedal edema. Gastrointestinal system: Abdomen is nondistended, soft and nontender. Normal bowel sounds heard. Central nervous system: Alert and oriente to person and place only. No focal neurological deficits. Extremities: Symmetric 5 x 5 power. Skin: No rashes, lesions or  ulcers Psychiatry: Mood & affect appropriate.     Data Reviewed: I have personally reviewed following labs and imaging studies  CBC: Recent Labs  Lab 03/03/21 1435 03/04/21 0447  WBC 24.8* 23.4*  NEUTROABS 22.1*  --   HGB 9.1* 8.4*  HCT 29.5* 26.7*  MCV 88.1 86.7  PLT 280 737    Basic Metabolic Panel: Recent Labs  Lab 03/03/21 1435 03/04/21 0447  NA 133* 128*  K 3.7 3.3*  CL 101 101  CO2 23 20*  GLUCOSE 108* 140*  BUN 12 10  CREATININE 0.48 0.38*  CALCIUM 8.4* 7.6*    GFR: Estimated Creatinine Clearance: 68.6 mL/min (A) (by C-G formula based on SCr of 0.38 mg/dL (L)).  Liver Function Tests: Recent Labs  Lab 03/03/21 1435  AST 17  ALT 16  ALKPHOS 87  BILITOT 0.5  PROT 6.4*  ALBUMIN 2.7*    CBG: No results for input(s): GLUCAP in the last 168 hours.   Recent Results (from the past 240 hour(s))  Resp Panel by RT-PCR (Flu A&B, Covid) Nasopharyngeal Swab     Status: None   Collection Time: 03/03/21  1:45 PM   Specimen: Nasopharyngeal Swab; Nasopharyngeal(NP) swabs in vial transport medium  Result Value Ref Range Status   SARS Coronavirus 2 by RT PCR NEGATIVE NEGATIVE Final    Comment: (NOTE) SARS-CoV-2 target nucleic acids are NOT DETECTED.  The SARS-CoV-2 RNA is generally detectable in upper respiratory specimens during the acute phase of infection. The lowest concentration of SARS-CoV-2 viral copies this assay can detect is 138 copies/mL. A negative result does not preclude SARS-Cov-2 infection and should not be used as the sole basis for treatment or other patient management decisions. A negative result may occur with  improper specimen collection/handling, submission of specimen other than nasopharyngeal swab, presence of viral mutation(s) within the areas targeted by this assay, and inadequate number of viral copies(<138 copies/mL). A negative result must be combined with clinical observations, patient history, and epidemiological information.  The expected result is Negative.  Fact Sheet for Patients:  EntrepreneurPulse.com.au  Fact Sheet for Healthcare Providers:  IncredibleEmployment.be  This test is no t yet approved or cleared by the [place] FDA and  has been authorized for detection and/or diagnosis of SARS-CoV-2 by FDA under an Emergency Use Authorization (EUA). This EUA will remain  in effect (meaning this test can be used) for the duration of the COVID-19 declaration under Section 564(b)(1) of the Act, 21 U.S.C.section 360bbb-3(b)(1), unless the authorization is terminated  or revoked sooner.       Influenza A by PCR NEGATIVE NEGATIVE Final   Influenza B by PCR NEGATIVE NEGATIVE Final    Comment: (NOTE) The Xpert Xpress SARS-CoV-2/FLU/RSV plus assay is intended as an aid in the diagnosis of influenza from Nasopharyngeal swab specimens and should not be used as a sole basis for treatment. Nasal washings and aspirates are unacceptable for Xpert Xpress SARS-CoV-2/FLU/RSV testing.  Fact Sheet for Patients: EntrepreneurPulse.com.au  Fact Sheet for Healthcare Providers: IncredibleEmployment.be  This  test is not yet approved or cleared by the [place] and has been authorized for detection and/or diagnosis of SARS-CoV-2 by FDA under an Emergency Use Authorization (EUA). This EUA will remain in effect (meaning this test can be used) for the duration of the COVID-19 declaration under Section 564(b)(1) of the Act, 21 U.S.C. section 360bbb-3(b)(1), unless the authorization is terminated or revoked.  Performed at [facility], [address], [place], [place] [address]   Culture, blood (Routine x 2)     Status: None (Preliminary result)   Collection Time: 03/03/21  2:33 PM   Specimen: BLOOD LEFT WRIST  Result Value Ref Range Status   Specimen Description BLOOD LEFT WRIST  Final   Special Requests   Final    BOTTLES DRAWN AEROBIC  AND ANAEROBIC Blood Culture results may not be optimal due to an inadequate volume of blood received in culture bottles   Culture   Final    NO GROWTH < 24 HOURS Performed at [facility], [address], [place], [place] [address]    Report Status PENDING  Incomplete  Culture, blood (Routine x 2)     Status: None (Preliminary result)   Collection Time: 03/03/21  3:14 PM   Specimen: BLOOD LEFT ARM  Result Value Ref Range Status   Specimen Description BLOOD LEFT ARM  Final   Special Requests   Final    BOTTLES DRAWN AEROBIC AND ANAEROBIC Blood Culture results may not be optimal due to an excessive volume of blood received in culture bottles   Culture   Final    NO GROWTH < 24 HOURS Performed at [facility] [address] [place], [place] [address]    Report Status PENDING  Incomplete  MRSA Next Gen by PCR, Nasal     Status: None   Collection Time: 03/04/21 11:41 AM   Specimen: Nasal Mucosa; Nasal Swab  Result Value Ref Range Status   MRSA by PCR Next Gen NOT DETECTED NOT DETECTED Final    Comment: (NOTE) The GeneXpert MRSA Assay (FDA approved for NASAL specimens only), is one component of a comprehensive MRSA colonization surveillance program. It is not intended to diagnose MRSA infection nor to guide or monitor treatment for MRSA infections. Test performance is not FDA approved in patients less than 36 years old. Performed at [facility], [place] [address], [place], [place] [address]          Radiology Studies: CT CHEST W CONTRAST  Result Date: 03/04/2021 CLINICAL DATA:  Follow-up abnormal chest plain film findings. EXAM: CT CHEST WITH CONTRAST TECHNIQUE: Multidetector CT imaging of the chest was performed during intravenous contrast administration. CONTRAST:  36m OMNIPAQUE IOHEXOL 350 MG/ML SOLN COMPARISON:  February 06, 2023 FINDINGS: Cardiovascular: No significant vascular findings. Normal heart size with moderate to marked severity coronary artery  calcification. No pericardial effusion. Mediastinum/Nodes: Subcentimeter calcified subcarinal lymph nodes are seen. Thyroid gland, trachea, and esophagus demonstrate no significant findings. Lungs/Pleura: Moderate to marked severity areas of atelectasis and/or infiltrate are seen within the bilateral upper lobes and bilateral lower lobes. This is decreased in severity within the left upper lobe, and increased in severity within the right upper lobe and left lower lobe when compared to the prior study. Involvement of the right lower lobe is family stable. Small bilateral pleural effusions are seen, right slightly greater than left. No pneumothorax is identified. Upper Abdomen: No acute abnormality. Musculoskeletal: Multiple chronic left-sided rib fractures are seen. A metallic density fixation plate  and screws are seen along the left clavicle. Mild, multilevel degenerative changes are noted throughout the thoracic spine. Other: The study is limited secondary to the presence of beam hardening artifact from a metallic density fixation plate and screws present within the mid to distal left humeral shaft. IMPRESSION: 1. Moderate to marked severity bilateral upper lobe and bilateral lower lobe atelectasis and/or infiltrate, as described above. 2. Small bilateral pleural effusions, right slightly greater than left. 3. Moderate to marked severity coronary artery calcification. Electronically Signed   By: Virgina Norfolk M.D.   On: 03/04/2021 04:32   DG Chest Portable 1 View  Result Date: 03/03/2021 CLINICAL DATA:  Fever, recent diagnosis pneumonia EXAM: PORTABLE CHEST 1 VIEW COMPARISON:  02/24/2021 FINDINGS: The heart size and mediastinal contours are within normal limits. Similar appearance of small bilateral pleural effusions. Unchanged heterogeneous airspace opacity throughout the lungs. Plate and screw fixation of the left clavicle. IMPRESSION: Unchanged heterogeneous airspace opacity throughout the lungs and  small bilateral pleural effusions. No new airspace opacity. Electronically Signed   By: Delanna Ahmadi M.D.   On: 03/03/2021 14:15        Scheduled Meds:  amantadine  100 mg Oral q morning   cloZAPine  200 mg Oral QHS   enoxaparin (LOVENOX) injection  40 mg Subcutaneous Q24H   feeding supplement  237 mL Oral BID BM   FLUoxetine  60 mg Oral Daily   mirtazapine  15 mg Oral QHS   potassium chloride  40 mEq Oral Once   sodium chloride (PF)       Continuous Infusions:  sodium chloride 100 mL/hr at 03/04/21 0256   ceFEPime (MAXIPIME) IV 2 g (03/04/21 1346)   sodium chloride       LOS: 1 day       Hosie Poisson, MD [facility]   To contact the attending provider between 7A-7P or the covering provider during after hours 7P-7A, please log into the web site [email or web address] and access using universal [place] password for that web site. If you do not have the password, please call the hospital operator.  03/04/2021, 2:04 PM

## 2021-03-04 NOTE — Progress Notes (Signed)
Patient noted to go into a red mews due to increase HR and temperature. PRN tylenol administered. Ice packs applied. NS bolus started. MD paged. Rapid response paged.

## 2021-03-04 NOTE — ED Notes (Signed)
Report called to Gershon Mussel, Therapist, sports at [facility]

## 2021-03-04 NOTE — Evaluation (Signed)
Occupational Therapy Evaluation Patient Details Name: Margaret [name] MRN: [id number] DOB: 09-13-1970 Today's Date: 03/04/2021   History of Present Illness 50 y.o. female with medical history significant for  schizophrenia/impulse disorder/intellectual disability, history of TB who is a resident of a group home and has an appointed guardian.  Patient presented with fever and flulike symptoms.  Recent hospitalization for recurrent aspiration pneumonia and empyema.   Clinical Impression   Patient admitted for the diagnosis above.  PTA she lived at a group home, most likely had generalized supervision as needed.  Currently she is likely at her baseline.  Patient did not need any physical assist for in room mobility/toileting, supervision for hygiene, stand grooming due to IV pole, decreased safety and impulsivity.  No acute OT needs identified.  PT consult ordered.  Recommended up and walking the halls with staff.  Currently on O2 at 2L.       Recommendations for follow up therapy are one component of a multi-disciplinary discharge planning process, led by the attending physician.  Recommendations may be updated based on patient status, additional functional criteria and insurance authorization.   Follow Up Recommendations  No OT follow up    Assistance Recommended at Discharge PRN  Functional Status Assessment  Patient has not had a recent decline in their functional status  Equipment Recommendations  None recommended by OT    Recommendations for Other Services PT consult     Precautions / Restrictions Precautions Precautions: Fall Precaution Comments: Impulsive, decreased safety Restrictions Weight Bearing Restrictions: No      Mobility Bed Mobility Overal bed mobility: Modified Independent                  Transfers Overall transfer level: Modified independent                 General transfer comment: mild unsteadiness noted, impulsive.      Balance Overall  balance assessment: Needs assistance Sitting-balance support: Feet supported;No upper extremity supported Sitting balance-Leahy Scale: Normal     Standing balance support: No upper extremity supported;During functional activity Standing balance-Leahy Scale: Good Standing balance comment: able to walk to the toilet and stand sink side for grooming.                           ADL either performed or assessed with clinical judgement   ADL Overall ADL's : At baseline                                       General ADL Comments: requires supervision and cues for functional tasks.     Vision Baseline Vision/History: 1 Wears glasses Patient Visual Report: No change from baseline Additional Comments: Glasses are not at the hospital     Perception Perception Perception: Not tested   Praxis Praxis Praxis: Not tested    Pertinent Vitals/Pain Pain Assessment: No/denies pain Pain Intervention(s): Monitored during session     Hand Dominance     Extremity/Trunk Assessment Upper Extremity Assessment Upper Extremity Assessment: Overall WFL for tasks assessed   Lower Extremity Assessment Lower Extremity Assessment: Defer to PT evaluation   Cervical / Trunk Assessment Cervical / Trunk Assessment: Normal   Communication Communication Communication: No difficulties   Cognition Arousal/Alertness: Awake/alert Behavior During Therapy: Impulsive Overall Cognitive Status: History of cognitive impairments - at baseline  General Comments: cues as needed     General Comments       Exercises     Shoulder Instructions      Home Living Family/patient expects to be discharged to:: Group home   Available Help at Discharge: Friend(s);Available PRN/intermittently Type of Home: House Home Access: Stairs to enter Entrance Stairs-Number of Steps: 5                   Home Equipment: None          Prior  Functioning/Environment Prior Level of Function : Independent/Modified Independent             Mobility Comments: Pt states she did everything for herself.          OT Problem List: Impaired balance (sitting and/or standing)      OT Treatment/Interventions:      OT Goals(Current goals can be found in the care plan section) Acute Rehab OT Goals Patient Stated Goal: I want to go to a group home in [place], I don't want to go back to mine. OT Goal Formulation: With patient Time For Goal Achievement: 03/11/21 Potential to Achieve Goals: Good  OT Frequency:     Barriers to D/C:  None noted          Co-evaluation              AM-PAC OT "6 Clicks" Daily Activity     Outcome Measure Help from another person eating meals?: None Help from another person taking care of personal grooming?: None Help from another person toileting, which includes using toliet, bedpan, or urinal?: None Help from another person bathing (including washing, rinsing, drying)?: None Help from another person to put on and taking off regular upper body clothing?: None Help from another person to put on and taking off regular lower body clothing?: None 6 Click Score: 24   End of Session Nurse Communication: Mobility status  Activity Tolerance: Patient tolerated treatment well Patient left: in bed;with call bell/phone within reach;with bed alarm set  OT Visit Diagnosis: Unsteadiness on feet (R26.81)                Time: 5993-5701 OT Time Calculation (min): 16 min Charges:  OT General Charges $OT Visit: 1 Visit OT Evaluation $OT Eval Moderate Complexity: 1 Mod  03/04/2021  RP, OTR/L  Acute Rehabilitation Services  Office:  [phone]   Metta Clines 03/04/2021, 2:57 PM

## 2021-03-04 NOTE — Progress Notes (Signed)
   03/04/21 0255  Assess: MEWS Score  Temp (!) 102.5 F (39.2 C)  BP 92/62  Pulse Rate (!) 104  Resp 20  SpO2 95 %  O2 Device Nasal Cannula  O2 Flow Rate (L/min) 2 L/min  Assess: MEWS Score  MEWS Temp 2  MEWS Systolic 1  MEWS Pulse 1  MEWS RR 0  MEWS LOC 0  MEWS Score 4  MEWS Score Color Red  Assess: if the MEWS score is Yellow or Red  Were vital signs taken at a resting state? Yes  Focused Assessment Change from prior assessment (see assessment flowsheet)  Does the patient meet 2 or more of the SIRS criteria? Yes  Does the patient have a confirmed or suspected source of infection? Yes  Provider and Rapid Response Notified? Yes  Early Detection of Sepsis Score *See Row Information* Medium  MEWS guidelines implemented *See Row Information* Yes  Treat  MEWS Interventions Administered prn meds/treatments;Escalated (See documentation below)  Pain Scale 0-10  Pain Score 0  Take Vital Signs  Increase Vital Sign Frequency  Red: Q 1hr X 4 then Q 4hr X 4, if remains red, continue Q 4hrs  Escalate  MEWS: Escalate Red: discuss with charge nurse/RN and provider, consider discussing with RRT  Notify: Charge Nurse/RN  Name of Charge Nurse/RN Notified TR  Date Charge Nurse/RN Notified 03/04/21  Time Charge Nurse/RN Notified 0315  Notify: Provider  Provider Name/Title Daniesl  Date Provider Notified 03/04/21  Time Provider Notified 0315  Notification Type Page  Notification Reason Change in status  Provider response No new orders  Date of Provider Response 03/04/21  Time of Provider Response 0316  Notify: Rapid Response  Name of Rapid Response RN Notified Mandy  Date Rapid Response Notified 03/04/21  Time Rapid Response Notified 0316  Document  Patient Outcome Stabilized after interventions  Progress note created (see row info) Yes  Assess: SIRS CRITERIA  SIRS Temperature  1  SIRS Pulse 1  SIRS Respirations  0  SIRS WBC 0  SIRS Score Sum  2

## 2021-03-04 NOTE — ED Notes (Signed)
Patient's SPO2 noted to be 90% on RA. Placed pt on oxygen via [place] @ 2 LPM. SPO2 increased to 94-100%.

## 2021-03-05 DIAGNOSIS — J189 Pneumonia, unspecified organism: Secondary | ICD-10-CM | POA: Diagnosis not present

## 2021-03-05 DIAGNOSIS — N9489 Other specified conditions associated with female genital organs and menstrual cycle: Secondary | ICD-10-CM | POA: Diagnosis not present

## 2021-03-05 DIAGNOSIS — R509 Fever, unspecified: Secondary | ICD-10-CM | POA: Diagnosis not present

## 2021-03-05 LAB — LACTIC ACID, PLASMA: Lactic Acid, Venous: 0.8 mmol/L (ref 0.5–1.9)

## 2021-03-05 LAB — CBC
HCT: 28.9 % — ABNORMAL LOW (ref 36.0–46.0)
Hemoglobin: 8.7 g/dL — ABNORMAL LOW (ref 12.0–15.0)
MCH: 26.3 pg (ref 26.0–34.0)
MCHC: 30.1 g/dL (ref 30.0–36.0)
MCV: 87.3 fL (ref 80.0–100.0)
Platelets: 281 10*3/uL (ref 150–400)
RBC: 3.31 MIL/uL — ABNORMAL LOW (ref 3.87–5.11)
RDW: 16.8 % — ABNORMAL HIGH (ref 11.5–15.5)
WBC: 21.1 10*3/uL — ABNORMAL HIGH (ref 4.0–10.5)
nRBC: 0 % (ref 0.0–0.2)

## 2021-03-05 LAB — URINE CULTURE: Culture: NO GROWTH

## 2021-03-05 LAB — BASIC METABOLIC PANEL
Anion gap: 7 (ref 5–15)
BUN: 7 mg/dL (ref 6–20)
CO2: 24 mmol/L (ref 22–32)
Calcium: 7.9 mg/dL — ABNORMAL LOW (ref 8.9–10.3)
Chloride: 105 mmol/L (ref 98–111)
Creatinine, Ser: 0.45 mg/dL (ref 0.44–1.00)
GFR, Estimated: >60 mL/min (ref >60)
Glucose, Bld: 95 mg/dL (ref 70–99)
Potassium: 3.6 mmol/L (ref 3.5–5.1)
Sodium: 136 mmol/L (ref 135–145)

## 2021-03-05 LAB — PROCALCITONIN: Procalcitonin: 0.22 ng/mL

## 2021-03-05 NOTE — Progress Notes (Signed)
PROGRESS NOTE    Margaret [name]  QHU:[id number] DOB: 10/26/1970 DOA: 03/03/2021 PCP: Dettinger, Fransisca Kaufmann, MD   Chief Complaint  Patient presents with   Fever    Brief Narrative:   50 year old lady with prior history of schizophrenia/impulse disorder,/intellectual disability, history of tuberculosis as a child, patient is a resident of group home, has an appointed guardian recently treated for recurrent aspiration pneumonia and empyema was discharged on Augmentin to complete the course on February 26, 2021 presents last night for fevers.  Assessment & Plan:   Principal Problem:   Multifocal pneumonia Active Problems:   Schizophrenia ([place])   Complex ovarian cyst   Sepsis ([place])   Intellectual disability   Sepsis with multifocal pneumonia Patient came in febrile, tachycardic, tachypneic with significant leukocytosis, normal lactic acid. Recent prolonged hospitalization for aspiration pneumonia with right empyema requiring chest tube fibrinolytics and a pigtail catheter. Currently COVID and influenza negative. Chest x-ray showed multifocal pneumonia CT of the chest shows Moderate to marked severity bilateral upper lobe and bilateral lower lobe atelectasis and/or infiltrate. Started empirically on broad-spectrum antibiotics and transition to IV cefepime. Follow-up blood cultures and urine cultures. Patient is currently requiring up to 2 L of nasal cannula oxygen plan to wean her off the oxygen in the next 24 hours and check ambulating oxygen levels prior to discharge. Lactic acid normal levels and procalcitonin is 0.22 Improving leukocytosis    Schizophrenia with history of intellectual disability Resume home medications. No agitation or behavioral abnormalities at this time. No clozapine continue with clozapine and Prozac     Complex right ovarian cyst/right adnexal mass OB/GYN on board, recommended ultrasound of the pelvis which has been ordered , was not an optimal  study MRI of the pelvis with and without contrast has been recommended which will be ordered at this time.    Severe protein calorie malnutrition Dietary will be consulted    Hypokalemia Replaced    Hyponatremia Probably secondary to dehydration and pneumonia Repeat sodium levels have improved    Anemia of chronic disease Hemoglobin around 8 and stable.  DVT prophylaxis: (Lovenox) Code Status: Full code Family Communication: None at bedside Disposition:   Status is: Inpatient  Remains inpatient appropriate because: Antibiotics for pneumonia       Consultants:  None Procedures: none.  Antimicrobials:  Antibiotics Given (last 72 hours)     Date/Time Action Medication Dose Rate   03/03/21 1503 New Bag/Given   ceFEPIme (MAXIPIME) 2 g in sodium chloride 0.9 % 100 mL IVPB 2 g 200 mL/hr   03/03/21 1605 New Bag/Given   vancomycin (VANCOREADY) IVPB 1000 mg/200 mL 1,000 mg 200 mL/hr   03/03/21 1610 New Bag/Given   metroNIDAZOLE (FLAGYL) IVPB 500 mg 500 mg 100 mL/hr   03/03/21 2150 New Bag/Given   ceFEPIme (MAXIPIME) 2 g in sodium chloride 0.9 % 100 mL IVPB 2 g 200 mL/hr   03/04/21 0509 New Bag/Given   ceFEPIme (MAXIPIME) 2 g in sodium chloride 0.9 % 100 mL IVPB 2 g 200 mL/hr   03/04/21 1346 New Bag/Given   ceFEPIme (MAXIPIME) 2 g in sodium chloride 0.9 % 100 mL IVPB 2 g 200 mL/hr   03/04/21 2100 New Bag/Given   ceFEPIme (MAXIPIME) 2 g in sodium chloride 0.9 % 100 mL IVPB 2 g 200 mL/hr   03/05/21 0539 New Bag/Given   ceFEPIme (MAXIPIME) 2 g in sodium chloride 0.9 % 100 mL IVPB 2 g 200 mL/hr        Subjective: No  new complaints  Objective: Vitals:   03/04/21 1544 03/04/21 2015 03/05/21 0037 03/05/21 0505  BP: (!) 94/59 102/61 100/61 (!) 85/55  Pulse: 98 (!) 102 86 89  Resp: (!) 28 20 18 18   Temp: 97.9 F (36.6 C) 99.2 F (37.3 C) 98.1 F (36.7 C) 98 F (36.7 C)  TempSrc: Oral Oral Oral Oral  SpO2: 100% 99% 100% 98%  Weight:      Height:         Intake/Output Summary (Last 24 hours) at 03/05/2021 1134 Last data filed at 03/05/2021 0600 Gross per 24 hour  Intake 1811.47 ml  Output 900 ml  Net 911.47 ml    Filed Weights   03/03/21 1435 03/03/21 1522 03/04/21 0255  Weight: 46.7 kg 46.7 kg 50.5 kg    Examination:  General exam: Appears calm and comfortable  Respiratory system: Diminished air entry at bases, no wheezing or rhonchi on 1 L of nasal cannula oxygen Cardiovascular system: S1 & S2 heard, RRR. No JVD,  No pedal edema. Gastrointestinal system: Abdomen is nondistended, soft and nontender. Normal bowel sounds heard. Central nervous system: Alert and oriented to person, grossly nonfocal Extremities: Symmetric 5 x 5 power. Skin: No rashes, lesions or ulcers Psychiatry: Mood is appropriate     Data Reviewed: I have personally reviewed following labs and imaging studies  CBC: Recent Labs  Lab 03/03/21 1435 03/04/21 0447 03/05/21 0541  WBC 24.8* 23.4* 21.1*  NEUTROABS 22.1*  --   --   HGB 9.1* 8.4* 8.7*  HCT 29.5* 26.7* 28.9*  MCV 88.1 86.7 87.3  PLT 280 238 281     Basic Metabolic Panel: Recent Labs  Lab 03/03/21 1435 03/04/21 0447 03/05/21 0541  NA 133* 128* 136  K 3.7 3.3* 3.6  CL 101 101 105  CO2 23 20* 24  GLUCOSE 108* 140* 95  BUN 12 10 7   CREATININE 0.48 0.38* 0.45  CALCIUM 8.4* 7.6* 7.9*     GFR: Estimated Creatinine Clearance: 68.6 mL/min (by C-G formula based on SCr of 0.45 mg/dL).  Liver Function Tests: Recent Labs  Lab 03/03/21 1435  AST 17  ALT 16  ALKPHOS 87  BILITOT 0.5  PROT 6.4*  ALBUMIN 2.7*     CBG: No results for input(s): GLUCAP in the last 168 hours.   Recent Results (from the past 240 hour(s))  Resp Panel by RT-PCR (Flu A&B, Covid) Nasopharyngeal Swab     Status: None   Collection Time: 03/03/21  1:45 PM   Specimen: Nasopharyngeal Swab; Nasopharyngeal(NP) swabs in vial transport medium  Result Value Ref Range Status   SARS Coronavirus 2 by RT PCR  NEGATIVE NEGATIVE Final    Comment: (NOTE) SARS-CoV-2 target nucleic acids are NOT DETECTED.  The SARS-CoV-2 RNA is generally detectable in upper respiratory specimens during the acute phase of infection. The lowest concentration of SARS-CoV-2 viral copies this assay can detect is 138 copies/mL. A negative result does not preclude SARS-Cov-2 infection and should not be used as the sole basis for treatment or other patient management decisions. A negative result may occur with  improper specimen collection/handling, submission of specimen other than nasopharyngeal swab, presence of viral mutation(s) within the areas targeted by this assay, and inadequate number of viral copies(<138 copies/mL). A negative result must be combined with clinical observations, patient history, and epidemiological information. The expected result is Negative.  Fact Sheet for Patients:  EntrepreneurPulse.com.au  Fact Sheet for Healthcare Providers:  IncredibleEmployment.be  This test is no t yet  approved or cleared by the [place] and  has been authorized for detection and/or diagnosis of SARS-CoV-2 by FDA under an Emergency Use Authorization (EUA). This EUA will remain  in effect (meaning this test can be used) for the duration of the COVID-19 declaration under Section 564(b)(1) of the Act, 21 U.S.C.section 360bbb-3(b)(1), unless the authorization is terminated  or revoked sooner.       Influenza A by PCR NEGATIVE NEGATIVE Final   Influenza B by PCR NEGATIVE NEGATIVE Final    Comment: (NOTE) The Xpert Xpress SARS-CoV-2/FLU/RSV plus assay is intended as an aid in the diagnosis of influenza from Nasopharyngeal swab specimens and should not be used as a sole basis for treatment. Nasal washings and aspirates are unacceptable for Xpert Xpress SARS-CoV-2/FLU/RSV testing.  Fact Sheet for Patients: EntrepreneurPulse.com.au  Fact Sheet for  Healthcare Providers: IncredibleEmployment.be  This test is not yet approved or cleared by the [place] FDA and has been authorized for detection and/or diagnosis of SARS-CoV-2 by FDA under an Emergency Use Authorization (EUA). This EUA will remain in effect (meaning this test can be used) for the duration of the COVID-19 declaration under Section 564(b)(1) of the Act, 21 U.S.C. section 360bbb-3(b)(1), unless the authorization is terminated or revoked.  Performed at [facility], [address], [place], [place] [address]   Culture, blood (Routine x 2)     Status: None (Preliminary result)   Collection Time: 03/03/21  2:33 PM   Specimen: BLOOD LEFT WRIST  Result Value Ref Range Status   Specimen Description BLOOD LEFT WRIST  Final   Special Requests   Final    BOTTLES DRAWN AEROBIC AND ANAEROBIC Blood Culture results may not be optimal due to an inadequate volume of blood received in culture bottles   Culture   Final    NO GROWTH 2 DAYS Performed at [facility], [address] [place], [place] [address]    Report Status PENDING  Incomplete  Culture, blood (Routine x 2)     Status: None (Preliminary result)   Collection Time: 03/03/21  3:14 PM   Specimen: BLOOD LEFT ARM  Result Value Ref Range Status   Specimen Description BLOOD LEFT ARM  Final   Special Requests   Final    BOTTLES DRAWN AEROBIC AND ANAEROBIC Blood Culture results may not be optimal due to an excessive volume of blood received in culture bottles   Culture   Final    NO GROWTH 2 DAYS Performed at [facility], [address], [place], [place] [address]    Report Status PENDING  Incomplete  Urine Culture     Status: None   Collection Time: 03/04/21  4:00 AM   Specimen: Urine, Clean Catch  Result Value Ref Range Status   Specimen Description   Final    URINE, CLEAN CATCH Performed at [facility], [place] [address] [place], [place] [address]    Special Requests   Final     NONE Performed at [facility], [place] [address], [place], [place] [address]    Culture   Final    NO GROWTH Performed at [facility], [place] [address], [place], [place] [address]    Report Status 03/05/2021 FINAL  Final  MRSA Next Gen by PCR, Nasal     Status: None   Collection Time: 03/04/21 11:41 AM   Specimen: Nasal Mucosa; Nasal Swab  Result Value Ref Range Status   MRSA by PCR Next Gen  NOT DETECTED NOT DETECTED Final    Comment: (NOTE) The GeneXpert MRSA Assay (FDA approved for NASAL specimens only), is one component of a comprehensive MRSA colonization surveillance program. It is not intended to diagnose MRSA infection nor to guide or monitor treatment for MRSA infections. Test performance is not FDA approved in patients less than 34 years old. Performed at [facility], [place] [address] [place], [place] [address]           Radiology Studies: CT CHEST W CONTRAST  Result Date: 03/04/2021 CLINICAL DATA:  Follow-up abnormal chest plain film findings. EXAM: CT CHEST WITH CONTRAST TECHNIQUE: Multidetector CT imaging of the chest was performed during intravenous contrast administration. CONTRAST:  11m OMNIPAQUE IOHEXOL 350 MG/ML SOLN COMPARISON:  February 06, 2023 FINDINGS: Cardiovascular: No significant vascular findings. Normal heart size with moderate to marked severity coronary artery calcification. No pericardial effusion. Mediastinum/Nodes: Subcentimeter calcified subcarinal lymph nodes are seen. Thyroid gland, trachea, and esophagus demonstrate no significant findings. Lungs/Pleura: Moderate to marked severity areas of atelectasis and/or infiltrate are seen within the bilateral upper lobes and bilateral lower lobes. This is decreased in severity within the left upper lobe, and increased in severity within the right upper lobe and left lower lobe when compared to the prior study. Involvement of the right lower lobe is family stable.  Small bilateral pleural effusions are seen, right slightly greater than left. No pneumothorax is identified. Upper Abdomen: No acute abnormality. Musculoskeletal: Multiple chronic left-sided rib fractures are seen. A metallic density fixation plate and screws are seen along the left clavicle. Mild, multilevel degenerative changes are noted throughout the thoracic spine. Other: The study is limited secondary to the presence of beam hardening artifact from a metallic density fixation plate and screws present within the mid to distal left humeral shaft. IMPRESSION: 1. Moderate to marked severity bilateral upper lobe and bilateral lower lobe atelectasis and/or infiltrate, as described above. 2. Small bilateral pleural effusions, right slightly greater than left. 3. Moderate to marked severity coronary artery calcification. Electronically Signed   By: TVirgina NorfolkM.D.   On: 03/04/2021 04:32   UKoreaPELVIS (TRANSABDOMINAL ONLY)  Result Date: 03/04/2021 CLINICAL DATA:  Adnexal mass seen on CT 01/21/2021 EXAM: TRANSABDOMINAL ULTRASOUND OF PELVIS TECHNIQUE: Transabdominal ultrasound examination of the pelvis was performed including evaluation of the uterus, ovaries, adnexal regions, and pelvic cul-de-sac. COMPARISON:  None. FINDINGS: Evaluation is markedly limited due to patient incontinence and unable to fill bladder, and inability to consent for transvaginal exam. Uterus Not identified. Endometrium Not identified. Right ovary Not identified. Left ovary Not identified. Other findings:  No definite free fluid was seen. IMPRESSION: Limited exam due to patient incontinence and inability to fill bladder, and inability to consent for transvaginal exam. The uterus and bilateral ovaries could not be identified. Consider pelvic nonemergent MRI with and without contrast to better evaluate the right adnexal lesion seen on prior CT. Electronically Signed   By: PValetta MoleM.D.   On: 03/04/2021 14:20   DG Chest Portable 1  View  Result Date: 03/03/2021 CLINICAL DATA:  Fever, recent diagnosis pneumonia EXAM: PORTABLE CHEST 1 VIEW COMPARISON:  02/24/2021 FINDINGS: The heart size and mediastinal contours are within normal limits. Similar appearance of small bilateral pleural effusions. Unchanged heterogeneous airspace opacity throughout the lungs. Plate and screw fixation of the left clavicle. IMPRESSION: Unchanged heterogeneous airspace opacity throughout the lungs and small bilateral pleural effusions. No new airspace opacity. Electronically Signed   By: AJamse MeadD.  On: 03/03/2021 14:15        Scheduled Meds:  amantadine  100 mg Oral q morning   cloZAPine  200 mg Oral QHS   enoxaparin (LOVENOX) injection  40 mg Subcutaneous Q24H   feeding supplement  237 mL Oral BID BM   FLUoxetine  60 mg Oral Daily   mirtazapine  15 mg Oral QHS   Continuous Infusions:  ceFEPime (MAXIPIME) IV 2 g (03/05/21 0539)     LOS: 2 days       Hosie Poisson, MD [facility]   To contact the attending provider between 7A-7P or the covering provider during after hours 7P-7A, please log into the web site [email or web address] and access using universal [place] password for that web site. If you do not have the password, please call the hospital operator.  03/05/2021, 11:34 AM

## 2021-03-05 NOTE — Evaluation (Signed)
Physical Therapy Evaluation Patient Details Name: Margaret [name] MRN: [id number] DOB: [birth year] Today's Date: 03/05/2021  History of Present Illness  Pt is 50 y.o. female presented on 03/03/21 with fever and flulike symptoms.  Recent hospitalization for recurrent aspiration pneumonia and empyema. Pt with medical history significant for  schizophrenia/impulse disorder/intellectual disability, history of TB who is a resident of a group home and has an appointed guardian.  Clinical Impression  Pt admitted with above diagnosis.  She demonstrates safe gait (400' on RA stable O2) and transfers at independent level (had supervision for evaluation).  Pt with cognitive delays at baseline.  Pt is  at her baseline - no further PT needs.        Recommendations for follow up therapy are one component of a multi-disciplinary discharge planning process, led by the attending physician.  Recommendations may be updated based on patient status, additional functional criteria and insurance authorization.  Follow Up Recommendations No PT follow up    Assistance Recommended at Discharge PRN  Functional Status Assessment Patient has not had a recent decline in their functional status  Equipment Recommendations  None recommended by PT    Recommendations for Other Services       Precautions / Restrictions Precautions Precautions: Fall Precaution Comments: Impulsive, decreased safety      Mobility  Bed Mobility Overal bed mobility: Independent                  Transfers Overall transfer level: Independent Equipment used: None   Sit to Stand: Independent           General transfer comment: Supervision during therapy but independent level    Ambulation/Gait Ambulation/Gait assistance: Independent Gait Distance (Feet): 400 Feet Assistive device: None Gait Pattern/deviations: WFL(Within Functional Limits)       General Gait Details: Had supervision for evaluation but ambulating at  independent level with O2 sats 97% on RA  Stairs            Wheelchair Mobility    Modified Rankin (Stroke Patients Only)       Balance Overall balance assessment: Independent Sitting-balance support: No upper extremity supported Sitting balance-Leahy Scale: Normal     Standing balance support: No upper extremity supported Standing balance-Leahy Scale: Good                               Pertinent Vitals/Pain Pain Assessment: No/denies pain    Home Living Family/patient expects to be discharged to:: Group home   Available Help at Discharge: Friend(s);Available PRN/intermittently Type of Home: House Home Access: Stairs to enter   Entrance Stairs-Number of Steps: 5     Home Equipment: None      Prior Function Prior Level of Function : Independent/Modified Independent             Mobility Comments: Pt states she did everything for herself.       Hand Dominance        Extremity/Trunk Assessment   Upper Extremity Assessment Upper Extremity Assessment: Overall WFL for tasks assessed    Lower Extremity Assessment Lower Extremity Assessment: Overall WFL for tasks assessed    Cervical / Trunk Assessment Cervical / Trunk Assessment: Normal  Communication   Communication: No difficulties  Cognition Arousal/Alertness: Awake/alert Behavior During Therapy: Impulsive Overall Cognitive Status: History of cognitive impairments - at baseline  General Comments      Exercises     Assessment/Plan    PT Assessment Patient does not need any further PT services  PT Problem List Decreased safety awareness       PT Treatment Interventions      PT Goals (Current goals can be found in the Care Plan section)  Acute Rehab PT Goals Patient Stated Goal: to return to group home PT Goal Formulation: All assessment and education complete, DC therapy    Frequency     Barriers to  discharge        Co-evaluation               AM-PAC PT "6 Clicks" Mobility  Outcome Measure Help needed turning from your back to your side while in a flat bed without using bedrails?: None Help needed moving from lying on your back to sitting on the side of a flat bed without using bedrails?: None Help needed moving to and from a bed to a chair (including a wheelchair)?: None Help needed standing up from a chair using your arms (e.g., wheelchair or bedside chair)?: None Help needed to walk in hospital room?: None Help needed climbing 3-5 steps with a railing? : A Little 6 Click Score: 23    End of Session   Activity Tolerance: Patient tolerated treatment well Patient left: with chair alarm set;in chair;with call bell/phone within reach Nurse Communication: Mobility status PT Visit Diagnosis: Other abnormalities of gait and mobility (R26.89)    Time: 6553-7482 PT Time Calculation (min) (ACUTE ONLY): 16 min   Charges:   PT Evaluation $PT Eval Low Complexity: 1 Low          Fantashia Shupert, PT Acute Rehab Services Pager [phone] [facility] [phone]   Karlton Lemon 03/05/2021, 12:43 PM

## 2021-03-05 NOTE — Progress Notes (Signed)
   03/05/21 2105  Assess: MEWS Score  Temp (!) 101 F (38.3 C)  BP (!) 101/56  Pulse Rate (!) 107  Resp (!) 26  SpO2 97 %  O2 Device Room Air  Assess: MEWS Score  MEWS Temp 1  MEWS Systolic 0  MEWS Pulse 1  MEWS RR 2  MEWS LOC 0  MEWS Score 4  MEWS Score Color Red  Assess: if the MEWS score is Yellow or Red  Were vital signs taken at a resting state? Yes  Focused Assessment Change from prior assessment (see assessment flowsheet)  Does the patient meet 2 or more of the SIRS criteria? Yes  Does the patient have a confirmed or suspected source of infection? Yes  Provider and Rapid Response Notified? Yes  Early Detection of Sepsis Score *See Row Information* Medium  MEWS guidelines implemented *See Row Information* Yes  Treat  Pain Scale 0-10  Pain Score 0  Take Vital Signs  Increase Vital Sign Frequency  Red: Q 1hr X 4 then Q 4hr X 4, if remains red, continue Q 4hrs  Escalate  MEWS: Escalate Red: discuss with charge nurse/RN and provider, consider discussing with RRT  Notify: Charge Nurse/RN  Name of Charge Nurse/RN Notified Rupinder  Date Charge Nurse/RN Notified 03/05/21  Time Charge Nurse/RN Notified 2145  Notify: Provider  Provider Name/Title Jeannette Corpus NP  Date Provider Notified 03/05/21  Time Provider Notified 2108  Notification Type Page  Notification Reason Other (Comment) (MEWS RED)  Provider response No new orders  Date of Provider Response 03/05/21  Time of Provider Response 2158  Document  Patient Outcome Other (Comment) (Will continue to closely monitor)  Progress note created (see row info) Yes  Assess: SIRS CRITERIA  SIRS Temperature  1  SIRS Pulse 1  SIRS Respirations  1  SIRS WBC 0  SIRS Score Sum  3   Pt given IS and educated on usage, pt performed about 8x with RN. Pt educated on deep breathing exercise and encouraged to do that and alternate with IS. Pt voices understanding. Will continue to closely monitor pt. Delia Heady RN

## 2021-03-05 NOTE — Progress Notes (Signed)
Rapid Response Nurse notified of Red MEWS. No new orders at this time.

## 2021-03-05 NOTE — TOC Initial Note (Addendum)
Transition of Care [facility]) - Initial/Assessment Note    Patient Details  Name: Margaret [name] MRN: [id number] Date of Birth: Jul 01, 1970  Transition of Care [facility]) CM/SW Contact:    Trish Mage, LCSW Phone Number: 03/05/2021, 7:59 AM  Clinical Narrative:   Left message at [facility], to let them know of admission to hospital. Patient is a resident at [facility] in [place]. TOC will continue to follow during the course of hospitalization.                 Expected Discharge Plan: Group Home Barriers to Discharge: No Barriers Identified   Patient Goals and CMS Choice        Expected Discharge Plan and Services Expected Discharge Plan: Group Home   Discharge Planning Services: CM Consult   Living arrangements for the past 2 months: Group Home                                      Prior Living Arrangements/Services Living arrangements for the past 2 months: Group Home Lives with:: Facility Resident Patient language and need for interpreter reviewed:: Yes        Need for Family Participation in Patient Care: Yes (Comment) Care giver support system in place?: Yes (comment)   Criminal Activity/Legal Involvement Pertinent to Current Situation/Hospitalization: No - Comment as needed  Activities of Daily Living Home Assistive Devices/Equipment: None ADL Screening (condition at time of admission) Patient's cognitive ability adequate to safely complete daily activities?: No Is the patient deaf or have difficulty hearing?: No Does the patient have difficulty seeing, even when wearing glasses/contacts?: No Does the patient have difficulty concentrating, remembering, or making decisions?: Yes Patient able to express need for assistance with ADLs?: Yes Does the patient have difficulty dressing or bathing?: Yes Independently performs ADLs?: No Communication: Independent Dressing (OT): Needs assistance Is this a change from baseline?: Pre-admission  baseline Grooming: Needs assistance Is this a change from baseline?: Pre-admission baseline Feeding: Independent Bathing: Needs assistance Is this a change from baseline?: Pre-admission baseline Toileting: Needs assistance Is this a change from baseline?: Pre-admission baseline In/Out Bed: Needs assistance Is this a change from baseline?: Pre-admission baseline Walks in Home: Independent Does the patient have difficulty walking or climbing stairs?: Yes Weakness of Legs: Both Weakness of Arms/Hands: None  Permission Sought/Granted      Share Information with NAME: [facility], [place], [place] [address]           Emotional Assessment Appearance:: Appears stated age     Orientation: : Oriented to Self, Oriented to Place, Oriented to Situation Alcohol / Substance Use: Not Applicable Psych Involvement: No (comment)  Admission diagnosis:  Sepsis ([place]) [A41.9] Fever, unspecified fever cause [R50.9] Multifocal pneumonia [J18.9] Sepsis, due to unspecified organism, unspecified whether acute organ dysfunction present [facility]) [A41.9] Patient Active Problem List   Diagnosis Date Noted   Multifocal pneumonia 03/03/2021   Sepsis ([place]) 03/03/2021   Intellectual disability 03/03/2021   Right adrenal mass ([place]) 02/24/2021   Poor appetite 02/24/2021   Protein-calorie malnutrition, severe 02/10/2021   Acute respiratory failure with hypoxia ([place]) 02/01/2021   Centrilobular emphysema ([place]) 02/01/2021   Acute pulmonary edema ([place]) 02/01/2021   Community acquired pneumonia 01/30/2021   Pleural effusion 01/30/2021   Hypokalemia 01/30/2021   Psychiatric disorder 01/30/2021   Leukocytosis 01/30/2021   Complex ovarian cyst 01/25/2021   Multiple pulmonary nodules 01/25/2021   RLQ  abdominal mass 01/05/2021   Vitamin D deficiency 11/30/2020   Weight loss 03/19/2019   Schizophrenia ([place])    Hepatitis B    Hyperlipidemia 12/18/2014   History of tuberculosis 11/17/2014   Orthostatic  hypotension 11/17/2014   Right nasal polyps 11/17/2014   IUD (intrauterine device) in place 11/12/2014   Chronic hepatitis B ([place]) 11/10/2014   PCP:  Dettinger, Fransisca Kaufmann, MD Pharmacy:   [facility], [place] [place] [address] Phone: [phone] Fax: [phone]     Social Determinants of Health (SDOH) Interventions    Readmission Risk Interventions No flowsheet data found.

## 2021-03-06 ENCOUNTER — Encounter (HOSPITAL_COMMUNITY): Payer: Self-pay | Admitting: Internal Medicine

## 2021-03-06 ENCOUNTER — Inpatient Hospital Stay (HOSPITAL_COMMUNITY): Payer: Medicare Other

## 2021-03-06 DIAGNOSIS — J189 Pneumonia, unspecified organism: Secondary | ICD-10-CM | POA: Diagnosis not present

## 2021-03-06 DIAGNOSIS — N9489 Other specified conditions associated with female genital organs and menstrual cycle: Secondary | ICD-10-CM | POA: Diagnosis not present

## 2021-03-06 DIAGNOSIS — R509 Fever, unspecified: Secondary | ICD-10-CM | POA: Diagnosis not present

## 2021-03-06 LAB — CBC
HCT: 30.3 % — ABNORMAL LOW (ref 36.0–46.0)
Hemoglobin: 9.2 g/dL — ABNORMAL LOW (ref 12.0–15.0)
MCH: 26.1 pg (ref 26.0–34.0)
MCHC: 30.4 g/dL (ref 30.0–36.0)
MCV: 85.8 fL (ref 80.0–100.0)
Platelets: 318 10*3/uL (ref 150–400)
RBC: 3.53 MIL/uL — ABNORMAL LOW (ref 3.87–5.11)
RDW: 16.8 % — ABNORMAL HIGH (ref 11.5–15.5)
WBC: 17.3 10*3/uL — ABNORMAL HIGH (ref 4.0–10.5)
nRBC: 0 % (ref 0.0–0.2)

## 2021-03-06 LAB — BASIC METABOLIC PANEL
Anion gap: 8 (ref 5–15)
BUN: 7 mg/dL (ref 6–20)
CO2: 27 mmol/L (ref 22–32)
Calcium: 8.5 mg/dL — ABNORMAL LOW (ref 8.9–10.3)
Chloride: 103 mmol/L (ref 98–111)
Creatinine, Ser: 0.44 mg/dL (ref 0.44–1.00)
GFR, Estimated: >60 mL/min (ref >60)
Glucose, Bld: 121 mg/dL — ABNORMAL HIGH (ref 70–99)
Potassium: 3.2 mmol/L — ABNORMAL LOW (ref 3.5–5.1)
Sodium: 138 mmol/L (ref 135–145)

## 2021-03-06 LAB — MAGNESIUM: Magnesium: 1.8 mg/dL (ref 1.7–2.4)

## 2021-03-06 LAB — CORTISOL-AM, BLOOD: Cortisol - AM: 12.7 ug/dL (ref 6.7–22.6)

## 2021-03-06 MED ORDER — HALOPERIDOL LACTATE 5 MG/ML IJ SOLN
2.0000 mg | Freq: Four times a day (QID) | INTRAMUSCULAR | Status: DC | PRN
  Administered 2021-03-06 (×2): 2 mg via INTRAVENOUS
  Filled 2021-03-06 (×2): qty 1

## 2021-03-06 MED ORDER — POTASSIUM CHLORIDE CRYS ER 20 MEQ PO TBCR
40.0000 meq | EXTENDED_RELEASE_TABLET | Freq: Two times a day (BID) | ORAL | Status: AC
  Administered 2021-03-06 (×2): 40 meq via ORAL
  Filled 2021-03-06 (×2): qty 2

## 2021-03-06 NOTE — Progress Notes (Signed)
Pharmacy Antibiotic Note  Margaret [name] is a 50 y.o. female admitted on 03/03/2021 with pneumonia.  Pharmacy has been consulted for Cefepime and Vancomycin dosing.  Afebrile, WBC 25K on admit >> 17K  Plan: D4 abx Cefepime 2gm IV q8h   Height: 5' 4"  (162.6 cm) Weight: 50.5 kg (111 lb 5.3 oz) IBW/kg (Calculated) : 54.7  Temp (24hrs), Avg:99.1 F (37.3 C), Min:98.1 F (36.7 C), Max:101 F (38.3 C)  Recent Labs  Lab 03/03/21 1435 03/03/21 1651 03/04/21 0447 03/05/21 0541 03/05/21 0824 03/06/21 0618  WBC 24.8*  --  23.4* 21.1*  --  17.3*  CREATININE 0.48  --  0.38* 0.45  --   --   LATICACIDVEN 0.8 1.2  --   --  0.8  --      Estimated Creatinine Clearance: 68.6 mL/min (by C-G formula based on SCr of 0.45 mg/dL).    No Known Allergies  Antimicrobials this admission: Vanc & Flagyl 11/30 x1  Cefepime 11/30 >>   Microbiology results: 11/30 BCx: ngtd 12/1 UCx: ng-final  12/1 MRSA PCR: neg  Thank you for allowing pharmacy to be a part of this patient's care  Minda Ditto PharmD [facility] [phone] 03/06/2021, 8:01 AM

## 2021-03-06 NOTE — Progress Notes (Signed)
Provider as well as RR are aware of this patient's condition. No new orders received regarding heart rate or resp rate. Patient continues as she has been since admission.

## 2021-03-06 NOTE — Progress Notes (Signed)
PROGRESS NOTE    Margaret [name]  YQI:[id number] DOB: 05-29-1970 DOA: 03/03/2021 PCP: Dettinger, Fransisca Kaufmann, MD   Chief Complaint  Patient presents with   Fever    Brief Narrative:   50 year old lady with prior history of schizophrenia/impulse disorder,/intellectual disability, history of tuberculosis as a child, patient is a resident of group home, has an appointed guardian recently treated for recurrent aspiration pneumonia and empyema was discharged on Augmentin to complete the course on February 26, 2021 presents last night for fevers.  Assessment & Plan:   Principal Problem:   Multifocal pneumonia Active Problems:   Schizophrenia ([place])   Complex ovarian cyst   Sepsis ([place])   Intellectual disability   Sepsis with multifocal pneumonia Patient came in febrile, tachycardic, tachypneic with significant leukocytosis, normal lactic acid. Recent prolonged hospitalization for aspiration pneumonia with right empyema requiring chest tube fibrinolytics and a pigtail catheter. Currently COVID and influenza negative. Chest x-ray showed multifocal pneumonia CT of the chest shows Moderate to marked severity bilateral upper lobe and bilateral lower lobe atelectasis and/or infiltrate. Started empirically on broad-spectrum antibiotics and transition to IV cefepime. Follow-up blood cultures and urine cultures. Patient is currently requiring up to 2 L of nasal cannula oxygen plan to wean her off the oxygen in the next 24 hours and check ambulating oxygen levels prior to discharge. Lactic acid normal levels and procalcitonin is 0.22 Fever overnight T-max of 101, previous blood cultures from admission have been negative so far. Repeat blood cultures have been ordered.  Recheck chest x-ray and get urine analysis. Improving leukocytosis    Schizophrenia with history of intellectual disability Resume home medications. No clozapine continue with clozapine and Prozac Haldol ordered for  agitation     Complex right ovarian cyst/right adnexal mass OB/GYN on board, recommended ultrasound of the pelvis which has been ordered , was not an optimal study MRI of the pelvis with and without contrast has been recommended which will be ordered at this time. Pending MRI   Severe protein calorie malnutrition Dietary will be consulted    Hypokalemia Replaced recheck potassium in the morning and get magnesium.    Hyponatremia Probably secondary to dehydration and pneumonia Repeat sodium levels have improved    Anemia of chronic disease Hemoglobin around 8 and stable.  DVT prophylaxis: (Lovenox) Code Status: Full code Family Communication: None at bedside, discussed with guardian over the phone and updated them Disposition:   Status is: Inpatient  Remains inpatient appropriate because: Antibiotics for pneumonia       Consultants:  None Procedures: none.  Antimicrobials:  Antibiotics Given (last 72 hours)     Date/Time Action Medication Dose Rate   03/03/21 1503 New Bag/Given   ceFEPIme (MAXIPIME) 2 g in sodium chloride 0.9 % 100 mL IVPB 2 g 200 mL/hr   03/03/21 1605 New Bag/Given   vancomycin (VANCOREADY) IVPB 1000 mg/200 mL 1,000 mg 200 mL/hr   03/03/21 1610 New Bag/Given   metroNIDAZOLE (FLAGYL) IVPB 500 mg 500 mg 100 mL/hr   03/03/21 2150 New Bag/Given   ceFEPIme (MAXIPIME) 2 g in sodium chloride 0.9 % 100 mL IVPB 2 g 200 mL/hr   03/04/21 0509 New Bag/Given   ceFEPIme (MAXIPIME) 2 g in sodium chloride 0.9 % 100 mL IVPB 2 g 200 mL/hr   03/04/21 1346 New Bag/Given   ceFEPIme (MAXIPIME) 2 g in sodium chloride 0.9 % 100 mL IVPB 2 g 200 mL/hr   03/04/21 2100 New Bag/Given   ceFEPIme (MAXIPIME) 2 g in sodium  chloride 0.9 % 100 mL IVPB 2 g 200 mL/hr   03/05/21 0539 New Bag/Given   ceFEPIme (MAXIPIME) 2 g in sodium chloride 0.9 % 100 mL IVPB 2 g 200 mL/hr   03/05/21 1349 New Bag/Given   ceFEPIme (MAXIPIME) 2 g in sodium chloride 0.9 % 100 mL IVPB 2 g 200  mL/hr   03/05/21 2124 New Bag/Given   ceFEPIme (MAXIPIME) 2 g in sodium chloride 0.9 % 100 mL IVPB 2 g 200 mL/hr   03/06/21 0535 New Bag/Given   ceFEPIme (MAXIPIME) 2 g in sodium chloride 0.9 % 100 mL IVPB 2 g 200 mL/hr        Subjective: Patient oriented to place and person only. No new complaints.  Objective: Vitals:   03/06/21 0105 03/06/21 0500 03/06/21 0914 03/06/21 1302  BP: (!) 107/57 99/63 104/62 100/70  Pulse: 78 93 95 (!) 109  Resp: (!) 24 (!) 22 (!) 23 (!) 23  Temp: 98.7 F (37.1 C) 98.6 F (37 C) 98 F (36.7 C) 98.4 F (36.9 C)  TempSrc: Oral Oral  Oral  SpO2: 97% 99% 100% 95%  Weight:      Height:        Intake/Output Summary (Last 24 hours) at 03/06/2021 1331 Last data filed at 03/06/2021 1030 Gross per 24 hour  Intake 1301.33 ml  Output 3500 ml  Net -2198.67 ml    Filed Weights   03/03/21 1435 03/03/21 1522 03/04/21 0255  Weight: 46.7 kg 46.7 kg 50.5 kg    Examination:  General exam: Appears calm and comfortable  Respiratory system: Clear to auscultation. Respiratory effort normal. Cardiovascular system: S1 & S2 heard, RRR. No JVD,  No pedal edema. Gastrointestinal system: Abdomen is nondistended, soft and nontender. Normal bowel sounds heard. Central nervous system: Alert and oriented to person and place only. Grossly non focal.  Extremities: Symmetric 5 x 5 power. Skin: No rashes, lesions or ulcers Psychiatry:  Mood & affect appropriate.       Data Reviewed: I have personally reviewed following labs and imaging studies  CBC: Recent Labs  Lab 03/03/21 1435 03/04/21 0447 03/05/21 0541 03/06/21 0618  WBC 24.8* 23.4* 21.1* 17.3*  NEUTROABS 22.1*  --   --   --   HGB 9.1* 8.4* 8.7* 9.2*  HCT 29.5* 26.7* 28.9* 30.3*  MCV 88.1 86.7 87.3 85.8  PLT 280 238 281 318     Basic Metabolic Panel: Recent Labs  Lab 03/03/21 1435 03/04/21 0447 03/05/21 0541 03/06/21 0618  NA 133* 128* 136 138  K 3.7 3.3* 3.6 3.2*  CL 101 101 105 103   CO2 23 20* 24 27  GLUCOSE 108* 140* 95 121*  BUN 12 10 7 7   CREATININE 0.48 0.38* 0.45 0.44  CALCIUM 8.4* 7.6* 7.9* 8.5*     GFR: Estimated Creatinine Clearance: 68.6 mL/min (by C-G formula based on SCr of 0.44 mg/dL).  Liver Function Tests: Recent Labs  Lab 03/03/21 1435  AST 17  ALT 16  ALKPHOS 87  BILITOT 0.5  PROT 6.4*  ALBUMIN 2.7*     CBG: No results for input(s): GLUCAP in the last 168 hours.   Recent Results (from the past 240 hour(s))  Resp Panel by RT-PCR (Flu A&B, Covid) Nasopharyngeal Swab     Status: None   Collection Time: 03/03/21  1:45 PM   Specimen: Nasopharyngeal Swab; Nasopharyngeal(NP) swabs in vial transport medium  Result Value Ref Range Status   SARS Coronavirus 2 by RT PCR NEGATIVE NEGATIVE Final  Comment: (NOTE) SARS-CoV-2 target nucleic acids are NOT DETECTED.  The SARS-CoV-2 RNA is generally detectable in upper respiratory specimens during the acute phase of infection. The lowest concentration of SARS-CoV-2 viral copies this assay can detect is 138 copies/mL. A negative result does not preclude SARS-Cov-2 infection and should not be used as the sole basis for treatment or other patient management decisions. A negative result may occur with  improper specimen collection/handling, submission of specimen other than nasopharyngeal swab, presence of viral mutation(s) within the areas targeted by this assay, and inadequate number of viral copies(<138 copies/mL). A negative result must be combined with clinical observations, patient history, and epidemiological information. The expected result is Negative.  Fact Sheet for Patients:  EntrepreneurPulse.com.au  Fact Sheet for Healthcare Providers:  IncredibleEmployment.be  This test is no t yet approved or cleared by the [place] FDA and  has been authorized for detection and/or diagnosis of SARS-CoV-2 by FDA under an Emergency Use Authorization  (EUA). This EUA will remain  in effect (meaning this test can be used) for the duration of the COVID-19 declaration under Section 564(b)(1) of the Act, 21 U.S.C.section 360bbb-3(b)(1), unless the authorization is terminated  or revoked sooner.       Influenza A by PCR NEGATIVE NEGATIVE Final   Influenza B by PCR NEGATIVE NEGATIVE Final    Comment: (NOTE) The Xpert Xpress SARS-CoV-2/FLU/RSV plus assay is intended as an aid in the diagnosis of influenza from Nasopharyngeal swab specimens and should not be used as a sole basis for treatment. Nasal washings and aspirates are unacceptable for Xpert Xpress SARS-CoV-2/FLU/RSV testing.  Fact Sheet for Patients: EntrepreneurPulse.com.au  Fact Sheet for Healthcare Providers: IncredibleEmployment.be  This test is not yet approved or cleared by the [place] FDA and has been authorized for detection and/or diagnosis of SARS-CoV-2 by FDA under an Emergency Use Authorization (EUA). This EUA will remain in effect (meaning this test can be used) for the duration of the COVID-19 declaration under Section 564(b)(1) of the Act, 21 U.S.C. section 360bbb-3(b)(1), unless the authorization is terminated or revoked.  Performed at [facility], [address] [place], [place] [address]   Culture, blood (Routine x 2)     Status: None (Preliminary result)   Collection Time: 03/03/21  2:33 PM   Specimen: BLOOD LEFT WRIST  Result Value Ref Range Status   Specimen Description BLOOD LEFT WRIST  Final   Special Requests   Final    BOTTLES DRAWN AEROBIC AND ANAEROBIC Blood Culture results may not be optimal due to an inadequate volume of blood received in culture bottles   Culture   Final    NO GROWTH 3 DAYS Performed at [facility], [address], [place], [place] [address]    Report Status PENDING  Incomplete  Culture, blood (Routine x 2)     Status: None (Preliminary result)   Collection Time: 03/03/21  3:14 PM    Specimen: BLOOD LEFT ARM  Result Value Ref Range Status   Specimen Description BLOOD LEFT ARM  Final   Special Requests   Final    BOTTLES DRAWN AEROBIC AND ANAEROBIC Blood Culture results may not be optimal due to an excessive volume of blood received in culture bottles   Culture   Final    NO GROWTH 3 DAYS Performed at [facility], [address] [place], [place] [address]    Report Status PENDING  Incomplete  Urine Culture     Status: None   Collection Time: 03/04/21  4:00 AM   Specimen: Urine, Clean Catch  Result Value Ref Range Status   Specimen Description   Final    URINE, CLEAN CATCH Performed at [facility], [place] [address], [place], [place] [address]    Special Requests   Final    NONE Performed at [facility], [place] [address] [place], [place] [address]    Culture   Final    NO GROWTH Performed at [facility], [place] [address] [place], [place] [address]    Report Status 03/05/2021 FINAL  Final  MRSA Next Gen by PCR, Nasal     Status: None   Collection Time: 03/04/21 11:41 AM   Specimen: Nasal Mucosa; Nasal Swab  Result Value Ref Range Status   MRSA by PCR Next Gen NOT DETECTED NOT DETECTED Final    Comment: (NOTE) The GeneXpert MRSA Assay (FDA approved for NASAL specimens only), is one component of a comprehensive MRSA colonization surveillance program. It is not intended to diagnose MRSA infection nor to guide or monitor treatment for MRSA infections. Test performance is not FDA approved in patients less than 33 years old. Performed at [facility], [place] [address], [place], [place] [address]           Radiology Studies: No results found.      Scheduled Meds:  amantadine  100 mg Oral q morning   cloZAPine  200 mg Oral QHS   enoxaparin (LOVENOX) injection  40 mg Subcutaneous Q24H   feeding supplement  237 mL Oral BID BM   FLUoxetine  60 mg Oral Daily   mirtazapine  15 mg Oral QHS    Continuous Infusions:  ceFEPime (MAXIPIME) IV 2 g (03/06/21 0535)     LOS: 3 days       Hosie Poisson, MD [facility]   To contact the attending provider between 7A-7P or the covering provider during after hours 7P-7A, please log into the web site [email or web address] and access using universal [place] password for that web site. If you do not have the password, please call the hospital operator.  03/06/2021, 1:31 PM

## 2021-03-06 NOTE — Progress Notes (Signed)
Pt was all up all night during shift and has not slept, prn Vistaril was administered but ineffective. NP on-call notified this am but NP said it was to too late for sleeping medication. No new order received. Will continue to closely monitor. Delia Heady RN

## 2021-03-07 DIAGNOSIS — N9489 Other specified conditions associated with female genital organs and menstrual cycle: Secondary | ICD-10-CM | POA: Diagnosis not present

## 2021-03-07 DIAGNOSIS — J189 Pneumonia, unspecified organism: Secondary | ICD-10-CM | POA: Diagnosis not present

## 2021-03-07 DIAGNOSIS — R509 Fever, unspecified: Secondary | ICD-10-CM | POA: Diagnosis not present

## 2021-03-07 LAB — CBC
HCT: 28 % — ABNORMAL LOW (ref 36.0–46.0)
Hemoglobin: 8.6 g/dL — ABNORMAL LOW (ref 12.0–15.0)
MCH: 26.1 pg (ref 26.0–34.0)
MCHC: 30.7 g/dL (ref 30.0–36.0)
MCV: 84.8 fL (ref 80.0–100.0)
Platelets: 335 10*3/uL (ref 150–400)
RBC: 3.3 MIL/uL — ABNORMAL LOW (ref 3.87–5.11)
RDW: 16.8 % — ABNORMAL HIGH (ref 11.5–15.5)
WBC: 15.1 10*3/uL — ABNORMAL HIGH (ref 4.0–10.5)
nRBC: 0 % (ref 0.0–0.2)

## 2021-03-07 LAB — BASIC METABOLIC PANEL
Anion gap: 3 — ABNORMAL LOW (ref 5–15)
BUN: 9 mg/dL (ref 6–20)
CO2: 28 mmol/L (ref 22–32)
Calcium: 8.8 mg/dL — ABNORMAL LOW (ref 8.9–10.3)
Chloride: 105 mmol/L (ref 98–111)
Creatinine, Ser: 0.47 mg/dL (ref 0.44–1.00)
GFR, Estimated: >60 mL/min (ref >60)
Glucose, Bld: 84 mg/dL (ref 70–99)
Potassium: 4.5 mmol/L (ref 3.5–5.1)
Sodium: 136 mmol/L (ref 135–145)

## 2021-03-07 NOTE — Progress Notes (Signed)
PROGRESS NOTE    Margaret [name]  CNO:[id number] DOB: May 07, 1970 DOA: 03/03/2021 PCP: Dettinger, Fransisca Kaufmann, MD   Chief Complaint  Patient presents with   Fever    Brief Narrative:   50 year old lady with prior history of schizophrenia/impulse disorder,/intellectual disability, history of tuberculosis as a child, patient is a resident of group home, has an appointed guardian recently treated for recurrent aspiration pneumonia and empyema was discharged on Augmentin to complete the course on February 26, 2021 presents last night for fevers. She was admitted for sepsis with multifocal pneumonia.   Assessment & Plan:   Principal Problem:   Multifocal pneumonia Active Problems:   Schizophrenia ([facility])   Complex ovarian cyst   Sepsis ([place])   Intellectual disability   Sepsis with multifocal pneumonia Patient came in febrile, tachycardic, tachypneic with significant leukocytosis, normal lactic acid. Recent prolonged hospitalization for aspiration pneumonia with right empyema requiring chest tube fibrinolytics and a pigtail catheter. Currently COVID and influenza negative. Chest x-ray showed multifocal pneumonia CT of the chest shows Moderate to marked severity bilateral upper lobe and bilateral lower lobe atelectasis and/or infiltrate. Started empirically on broad-spectrum antibiotics and transition to IV cefepime. Follow-up blood cultures and urine cultures. Patient is currently requiring up to 2 L of nasal cannula oxygen plan to wean her off the oxygen in the next 24 hours and check ambulating oxygen levels prior to discharge. Lactic acid normal levels and procalcitonin is 0.22 T-max of 101 on 03/06/21, repeat CXR shows Persistent bilateral opacities with improved aeration at the left lung base. previous blood cultures from admission have been negative so far. Repeat blood cultures have been negative so far.   Leukocytosis appear to be improving.    Schizophrenia with history of  intellectual disability Resume home medications. No clozapine continue with clozapine and Prozac Haldol ordered for agitation     Complex right ovarian cyst/right adnexal mass OB/GYN on board, recommended ultrasound of the pelvis which has been ordered , was not an optimal study MRI of the pelvis with and without contrast has been recommended which will be ordered at this time. Pending MRI   Severe protein calorie malnutrition Dietary will be consulted    Hypokalemia Replaced . Repeat level wnl.     Hyponatremia Probably secondary to dehydration and pneumonia Repeat sodium levels have improved    Anemia of chronic disease Hemoglobin around 8 and stable.  DVT prophylaxis: (Lovenox) Code Status: Full code Family Communication: None at bedside, discussed with guardian over the phone and updated them Disposition:   Status is: Inpatient  Remains inpatient appropriate because: Antibiotics for pneumonia       Consultants:  None Procedures: none.  Antimicrobials:  Antibiotics Given (last 72 hours)     Date/Time Action Medication Dose Rate   03/04/21 2100 New Bag/Given   ceFEPIme (MAXIPIME) 2 g in sodium chloride 0.9 % 100 mL IVPB 2 g 200 mL/hr   03/05/21 0539 New Bag/Given   ceFEPIme (MAXIPIME) 2 g in sodium chloride 0.9 % 100 mL IVPB 2 g 200 mL/hr   03/05/21 1349 New Bag/Given   ceFEPIme (MAXIPIME) 2 g in sodium chloride 0.9 % 100 mL IVPB 2 g 200 mL/hr   03/05/21 2124 New Bag/Given   ceFEPIme (MAXIPIME) 2 g in sodium chloride 0.9 % 100 mL IVPB 2 g 200 mL/hr   03/06/21 0535 New Bag/Given   ceFEPIme (MAXIPIME) 2 g in sodium chloride 0.9 % 100 mL IVPB 2 g 200 mL/hr   03/06/21 1344 New Bag/Given  ceFEPIme (MAXIPIME) 2 g in sodium chloride 0.9 % 100 mL IVPB 2 g 200 mL/hr   03/06/21 2242 New Bag/Given   ceFEPIme (MAXIPIME) 2 g in sodium chloride 0.9 % 100 mL IVPB 2 g 200 mL/hr   03/07/21 0630 New Bag/Given   ceFEPIme (MAXIPIME) 2 g in sodium chloride 0.9 % 100 mL  IVPB 2 g 200 mL/hr        Subjective:no chest pain. Sob improved.  Weaned off oxygen.   Objective: Vitals:   03/06/21 1302 03/06/21 1658 03/06/21 1933 03/07/21 0500  BP: 100/70 (!) 101/57 (!) 107/51 98/63  Pulse: (!) 109 89 95 90  Resp: (!) 23 (!) 22 (!) 24 20  Temp: 98.4 F (36.9 C) 98 F (36.7 C) 98.5 F (36.9 C) 98.3 F (36.8 C)  TempSrc: Oral Oral Oral Oral  SpO2: 95% 100% 100% 97%  Weight:      Height:        Intake/Output Summary (Last 24 hours) at 03/07/2021 1347 Last data filed at 03/07/2021 0526 Gross per 24 hour  Intake 219.68 ml  Output 1100 ml  Net -880.32 ml    Filed Weights   03/03/21 1435 03/03/21 1522 03/04/21 0255  Weight: 46.7 kg 46.7 kg 50.5 kg    Examination:  General exam: Appears calm and comfortable  Respiratory system: Clear to auscultation. Respiratory effort normal. Cardiovascular system: S1 & S2 heard, RRR. No JVD, No pedal edema. Gastrointestinal system: Abdomen is nondistended, soft and nontender. Normal bowel sounds heard. Central nervous system: Alert and oriented. No focal neurological deficits. Extremities: Symmetric 5 x 5 power. Skin: No rashes, lesions or ulcers Psychiatry: Mood & affect appropriate.        Data Reviewed: I have personally reviewed following labs and imaging studies  CBC: Recent Labs  Lab 03/03/21 1435 03/04/21 0447 03/05/21 0541 03/06/21 0618 03/07/21 0541  WBC 24.8* 23.4* 21.1* 17.3* 15.1*  NEUTROABS 22.1*  --   --   --   --   HGB 9.1* 8.4* 8.7* 9.2* 8.6*  HCT 29.5* 26.7* 28.9* 30.3* 28.0*  MCV 88.1 86.7 87.3 85.8 84.8  PLT 280 238 281 318 335     Basic Metabolic Panel: Recent Labs  Lab 03/03/21 1435 03/04/21 0447 03/05/21 0541 03/06/21 0618 03/07/21 0541  NA 133* 128* 136 138 136  K 3.7 3.3* 3.6 3.2* 4.5  CL 101 101 105 103 105  CO2 23 20* 24 27 28   GLUCOSE 108* 140* 95 121* 84  BUN 12 10 7 7 9   CREATININE 0.48 0.38* 0.45 0.44 0.47  CALCIUM 8.4* 7.6* 7.9* 8.5* 8.8*  MG  --    --   --  1.8  --      GFR: Estimated Creatinine Clearance: 68.6 mL/min (by C-G formula based on SCr of 0.47 mg/dL).  Liver Function Tests: Recent Labs  Lab 03/03/21 1435  AST 17  ALT 16  ALKPHOS 87  BILITOT 0.5  PROT 6.4*  ALBUMIN 2.7*     CBG: No results for input(s): GLUCAP in the last 168 hours.   Recent Results (from the past 240 hour(s))  Resp Panel by RT-PCR (Flu A&B, Covid) Nasopharyngeal Swab     Status: None   Collection Time: 03/03/21  1:45 PM   Specimen: Nasopharyngeal Swab; Nasopharyngeal(NP) swabs in vial transport medium  Result Value Ref Range Status   SARS Coronavirus 2 by RT PCR NEGATIVE NEGATIVE Final    Comment: (NOTE) SARS-CoV-2 target nucleic acids are NOT DETECTED.  The  SARS-CoV-2 RNA is generally detectable in upper respiratory specimens during the acute phase of infection. The lowest concentration of SARS-CoV-2 viral copies this assay can detect is 138 copies/mL. A negative result does not preclude SARS-Cov-2 infection and should not be used as the sole basis for treatment or other patient management decisions. A negative result may occur with  improper specimen collection/handling, submission of specimen other than nasopharyngeal swab, presence of viral mutation(s) within the areas targeted by this assay, and inadequate number of viral copies(<138 copies/mL). A negative result must be combined with clinical observations, patient history, and epidemiological information. The expected result is Negative.  Fact Sheet for Patients:  EntrepreneurPulse.com.au  Fact Sheet for Healthcare Providers:  IncredibleEmployment.be  This test is no t yet approved or cleared by the [place] FDA and  has been authorized for detection and/or diagnosis of SARS-CoV-2 by FDA under an Emergency Use Authorization (EUA). This EUA will remain  in effect (meaning this test can be used) for the duration of the COVID-19  declaration under Section 564(b)(1) of the Act, 21 U.S.C.section 360bbb-3(b)(1), unless the authorization is terminated  or revoked sooner.       Influenza A by PCR NEGATIVE NEGATIVE Final   Influenza B by PCR NEGATIVE NEGATIVE Final    Comment: (NOTE) The Xpert Xpress SARS-CoV-2/FLU/RSV plus assay is intended as an aid in the diagnosis of influenza from Nasopharyngeal swab specimens and should not be used as a sole basis for treatment. Nasal washings and aspirates are unacceptable for Xpert Xpress SARS-CoV-2/FLU/RSV testing.  Fact Sheet for Patients: EntrepreneurPulse.com.au  Fact Sheet for Healthcare Providers: IncredibleEmployment.be  This test is not yet approved or cleared by the [place] FDA and has been authorized for detection and/or diagnosis of SARS-CoV-2 by FDA under an Emergency Use Authorization (EUA). This EUA will remain in effect (meaning this test can be used) for the duration of the COVID-19 declaration under Section 564(b)(1) of the Act, 21 U.S.C. section 360bbb-3(b)(1), unless the authorization is terminated or revoked.  Performed at [facility], [address], [place], [place] [address]   Culture, blood (Routine x 2)     Status: None (Preliminary result)   Collection Time: 03/03/21  2:33 PM   Specimen: BLOOD LEFT WRIST  Result Value Ref Range Status   Specimen Description BLOOD LEFT WRIST  Final   Special Requests   Final    BOTTLES DRAWN AEROBIC AND ANAEROBIC Blood Culture results may not be optimal due to an inadequate volume of blood received in culture bottles   Culture   Final    NO GROWTH 4 DAYS Performed at [facility], [address] [place], [place] [address]    Report Status PENDING  Incomplete  Culture, blood (Routine x 2)     Status: None (Preliminary result)   Collection Time: 03/03/21  3:14 PM   Specimen: BLOOD LEFT ARM  Result Value Ref Range Status   Specimen Description BLOOD LEFT ARM  Final    Special Requests   Final    BOTTLES DRAWN AEROBIC AND ANAEROBIC Blood Culture results may not be optimal due to an excessive volume of blood received in culture bottles   Culture   Final    NO GROWTH 4 DAYS Performed at [facility], [address] [place], [place] [address]    Report Status PENDING  Incomplete  Urine Culture     Status: None   Collection Time: 03/04/21  4:00 AM   Specimen: Urine, Clean Catch  Result  Value Ref Range Status   Specimen Description   Final    URINE, CLEAN CATCH Performed at [facility], [place] [address] [place], [place] [address]    Special Requests   Final    NONE Performed at [facility], [place] [address], [place], [place] [address]    Culture   Final    NO GROWTH Performed at [facility], [place] [address], [place], [place] [address]    Report Status 03/05/2021 FINAL  Final  MRSA Next Gen by PCR, Nasal     Status: None   Collection Time: 03/04/21 11:41 AM   Specimen: Nasal Mucosa; Nasal Swab  Result Value Ref Range Status   MRSA by PCR Next Gen NOT DETECTED NOT DETECTED Final    Comment: (NOTE) The GeneXpert MRSA Assay (FDA approved for NASAL specimens only), is one component of a comprehensive MRSA colonization surveillance program. It is not intended to diagnose MRSA infection nor to guide or monitor treatment for MRSA infections. Test performance is not FDA approved in patients less than 27 years old. Performed at [facility], [place] [address] [place], [place] [address]   Culture, blood (Routine X 2) w Reflex to ID Panel     Status: None (Preliminary result)   Collection Time: 03/06/21  4:07 PM   Specimen: BLOOD  Result Value Ref Range Status   Specimen Description   Final    BLOOD BLOOD RIGHT HAND Performed at [facility] [address], [place], [place] [address]    Special Requests   Final    BOTTLES DRAWN AEROBIC ONLY Blood Culture adequate  volume Performed at [facility] [address] [place] [address], [place], [place] [address]    Culture   Final    NO GROWTH < 24 HOURS Performed at [facility] [address], [place], [place] [address]    Report Status PENDING  Incomplete  Culture, blood (Routine X 2) w Reflex to ID Panel     Status: None (Preliminary result)   Collection Time: 03/06/21  4:07 PM   Specimen: BLOOD  Result Value Ref Range Status   Specimen Description   Final    BLOOD RIGHT ANTECUBITAL Performed at [facility] [address], [place], [place] [address]    Special Requests   Final    BOTTLES DRAWN AEROBIC ONLY Blood Culture adequate volume Performed at [facility] [address] [place], [place] [address]    Culture   Final    NO GROWTH < 24 HOURS Performed at [facility] [place] [address], [place],  [address]    Report Status PENDING  Incomplete          Radiology Studies: DG Chest 2 View  Result Date: 03/06/2021 CLINICAL DATA:  Fever EXAM: CHEST - 2 VIEW COMPARISON:  03/03/2021 FINDINGS: Persistent bilateral interstitial and alveolar opacities. There is improved aeration at the left lung base. Small right pleural effusion. Stable cardiomediastinal contours. IMPRESSION: Persistent bilateral opacities with improved aeration at the left lung base. Small right pleural effusion. Electronically Signed   By: Macy Mis M.D.   On: 03/06/2021 17:23        Scheduled Meds:  amantadine  100 mg Oral q morning   cloZAPine  200 mg Oral QHS   enoxaparin (LOVENOX) injection  40 mg Subcutaneous Q24H   feeding supplement  237 mL Oral BID BM   FLUoxetine  60 mg Oral Daily   mirtazapine  15 mg Oral QHS   Continuous Infusions:  ceFEPime (MAXIPIME) IV 2 g (03/07/21 0630)     LOS: 4 days       Hosie Poisson, MD [facility]   To contact the attending provider between 7A-7P or the covering provider during after hours  7P-7A, please log into the web site [email or web address] and access using universal [place] password for that web site. If you do not have the password, please call the hospital operator.  03/07/2021, 1:47 PM

## 2021-03-08 ENCOUNTER — Inpatient Hospital Stay (HOSPITAL_COMMUNITY): Payer: Medicare Other

## 2021-03-08 DIAGNOSIS — J189 Pneumonia, unspecified organism: Secondary | ICD-10-CM | POA: Diagnosis not present

## 2021-03-08 DIAGNOSIS — N9489 Other specified conditions associated with female genital organs and menstrual cycle: Secondary | ICD-10-CM | POA: Diagnosis not present

## 2021-03-08 LAB — CULTURE, BLOOD (ROUTINE X 2)
Culture: NO GROWTH
Culture: NO GROWTH

## 2021-03-08 LAB — RESP PANEL BY RT-PCR (FLU A&B, COVID) ARPGX2
Influenza A by PCR: NEGATIVE
Influenza B by PCR: NEGATIVE
SARS Coronavirus 2 by RT PCR: NEGATIVE

## 2021-03-08 IMAGING — MR MR PELVIS WO/W CM
19 of 20 series · 46 of 48 positions shown · IV contrast (gadavist)
Comparison: CT [DATE]

CLINICAL DATA: Further evaluation of right ovarian cyst.

EXAM:
MRI PELVIS WITHOUT AND WITH CONTRAST
TECHNIQUE: Multiplanar multisequence MR imaging of the pelvis was performed
both before and after administration of intravenous contrast.
CONTRAST:  5.5mL GADAVIST GADOBUTROL 1 MMOL/ML IV SOLN

[Series 2: T2 · coronal · 6.0mm · 1.48mm/px · 2 of 30 slices shown (1 of 3)]
[im 1/30]
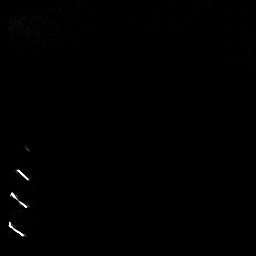
[im 30/30]
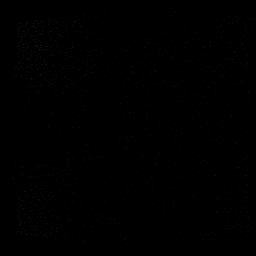

[Series 4: T2 · coronal · 4.0mm · 0.53mm/px · 2 of 34 slices shown (2 of 3)]
[im 1/34]
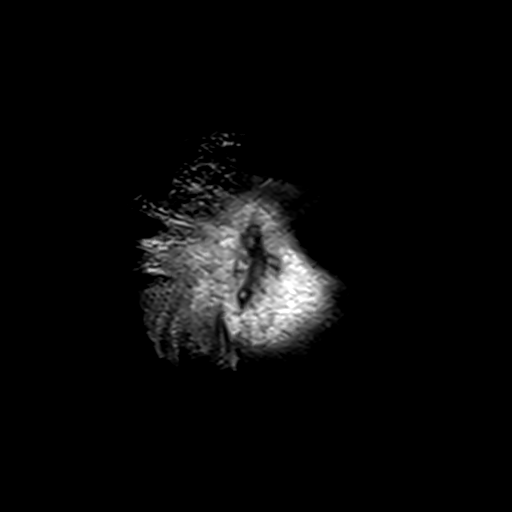
[im 34/34]
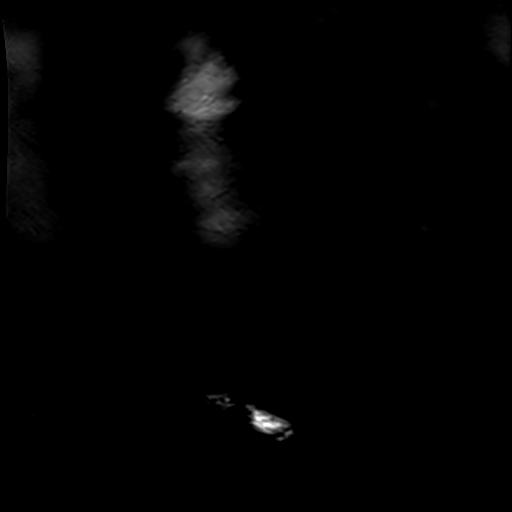

[Series 5: T2 fat-sat · axial · 5.0mm · 0.51mm/px · 1 of 34 slices shown]
[im 1/34]
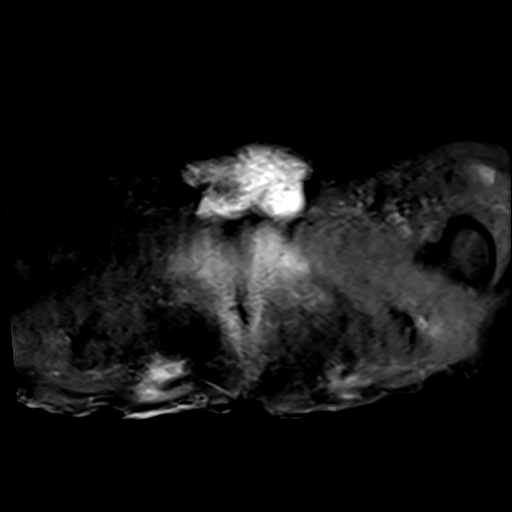

[Series 6: T2 · sagittal · 5.0mm · 0.55mm/px · 2 of 40 slices shown (3 of 3)]
[im 1/40]
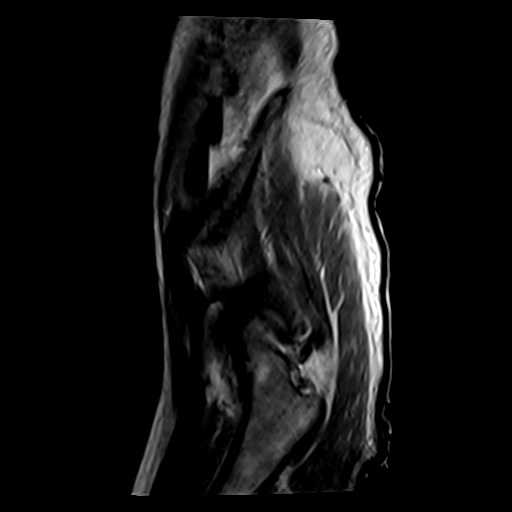
[im 40/40]
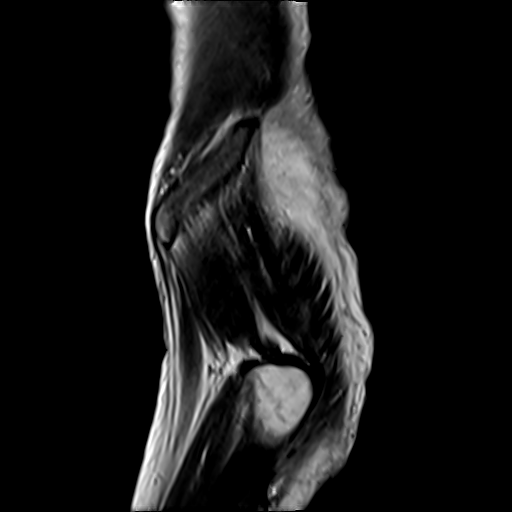

[Series 7: T1 · axial · 4.0mm · 0.84mm/px · z∈[-106,+178]mm · 3 of 72 slices shown (1 of 2)]
[im 1/72]
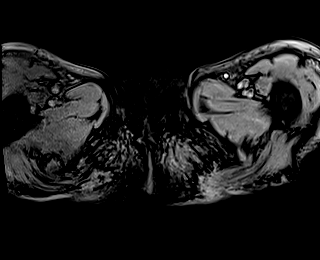
[im 36/72]
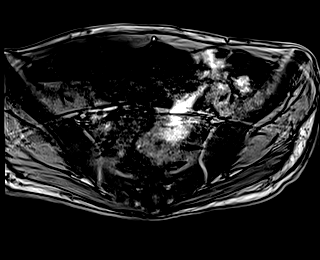
[im 72/72]
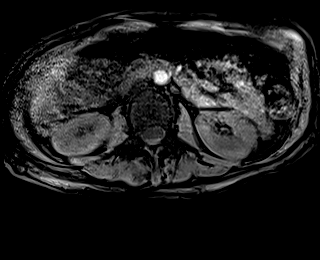

[Series 8: T1 · axial · 4.0mm · 0.84mm/px · z∈[-106,+178]mm · 3 of 72 slices shown (2 of 2)]
[im 1/72]
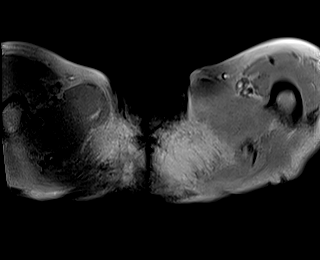
[im 36/72]
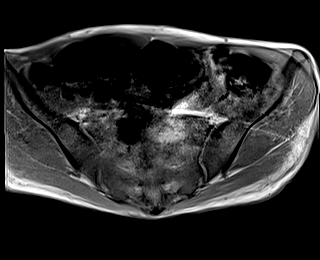
[im 72/72]
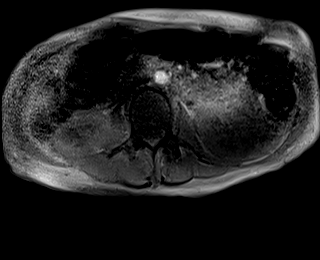

[Series 9: DWI · axial · 5.0mm · 2.80mm/px · z∈[-88,+101]mm · 3 of 86 slices shown (1 of 3)]
[im 1/86]
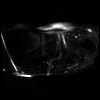
[im 43/86]
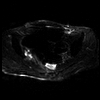
[im 86/86]
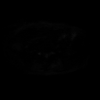

[Series 10: DWI · axial · 5.0mm · 2.80mm/px · 1 of 30 slices shown (2 of 3)]
[im 1/30]
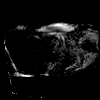

[Series 11: DWI · axial · 5.0mm · 2.80mm/px · 1 of 30 slices shown (3 of 3)]
[im 1/30]
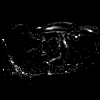

[Series 13: T1 dynamic · axial · 3.0mm · 0.84mm/px · z∈[-105,+132]mm · 3 of 80 slices shown (1 of 7)]
[im 1/80]
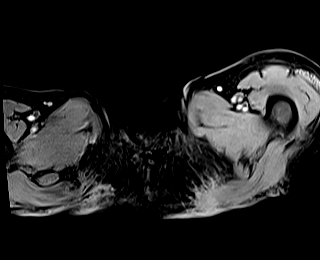
[im 40/80]
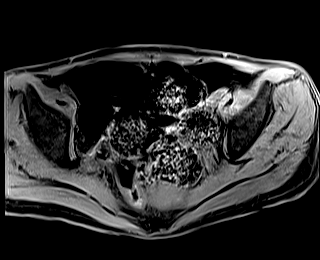
[im 80/80]
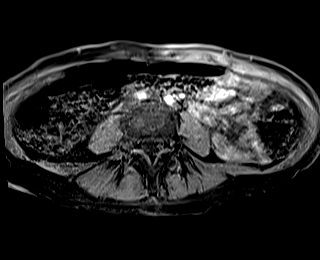

[Series 17: T1 dynamic · axial · 3.0mm · 0.84mm/px · z∈[-105,+132]mm · 3 of 80 slices shown (2 of 7)]
[im 1/80]
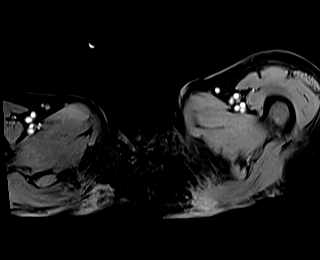
[im 40/80]
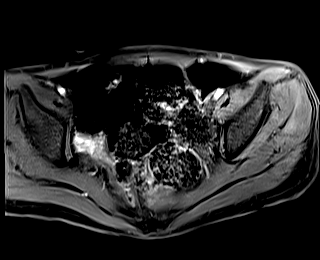
[im 80/80]
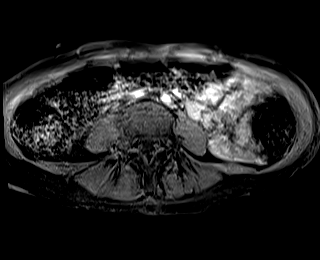

[Series 18: T1 dynamic · axial · 3.0mm · 0.84mm/px · z∈[-105,+132]mm · 3 of 80 slices shown (3 of 7)]
[im 1/80]
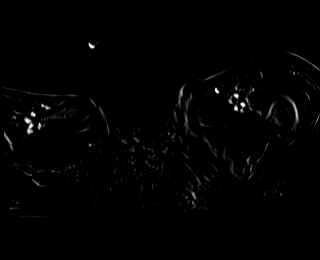
[im 40/80]
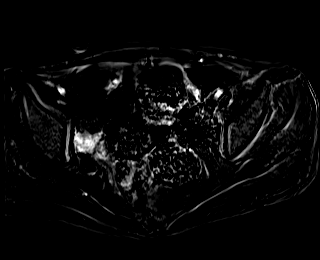
[im 80/80]
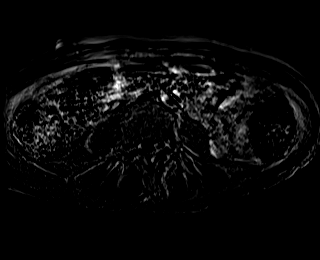

[Series 21: T1 dynamic · axial · 3.0mm · 0.84mm/px · z∈[-105,+132]mm · 3 of 80 slices shown (4 of 7)]
[im 1/80]
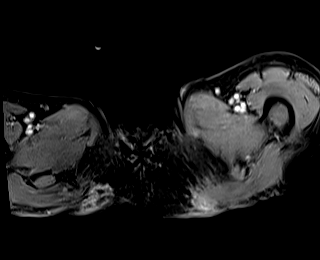
[im 40/80]
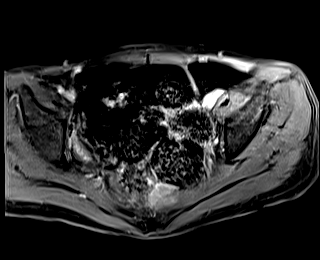
[im 80/80]
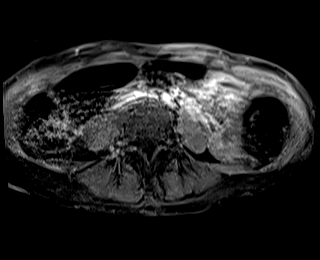

[Series 22: T1 dynamic · axial · 3.0mm · 0.84mm/px · z∈[-105,+132]mm · 3 of 80 slices shown (5 of 7)]
[im 1/80]
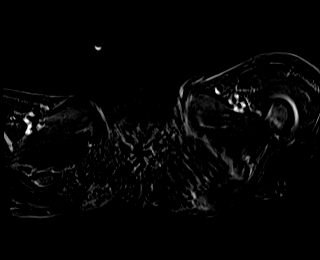
[im 40/80]
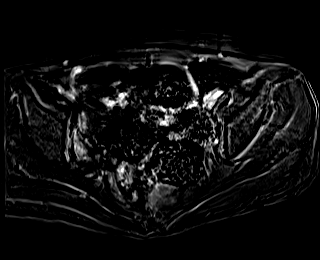
[im 80/80]
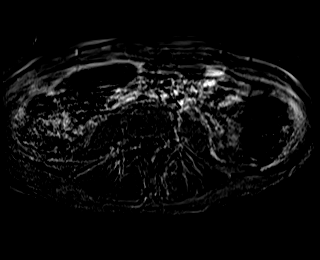

[Series 25: T1 dynamic · axial · 3.0mm · 0.84mm/px · z∈[-105,+132]mm · 3 of 80 slices shown (6 of 7)]
[im 1/80]
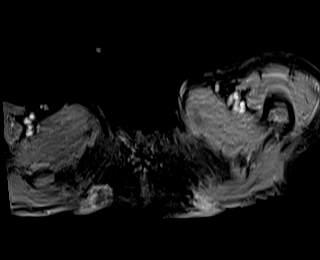
[im 40/80]
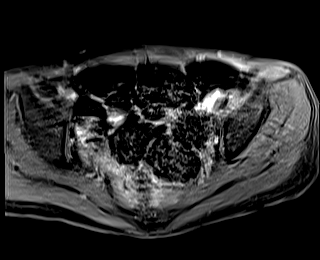
[im 80/80]
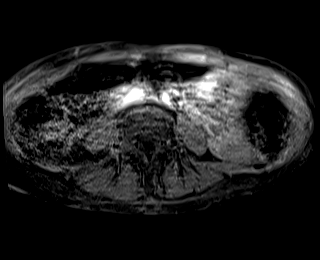

[Series 26: T1 dynamic · axial · 3.0mm · 0.84mm/px · z∈[-105,+132]mm · 3 of 80 slices shown (7 of 7)]
[im 1/80]
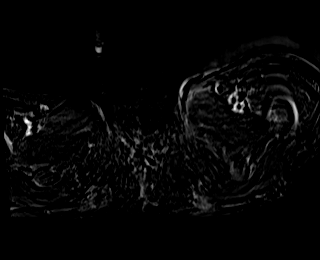
[im 40/80]
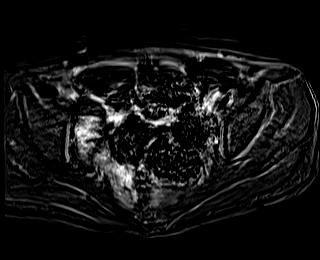
[im 80/80]
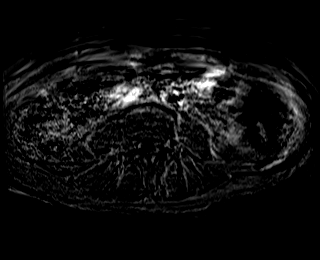

[Series 28: T1 dynamic post-contrast · axial · 3.0mm · 1.06mm/px · z∈[-91,+146]mm · 3 of 80 slices shown (1 of 2)]
[im 1/80]
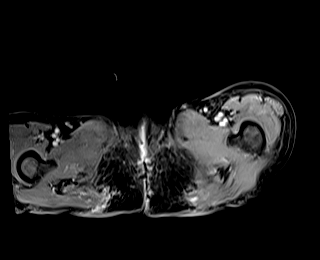
[im 40/80]
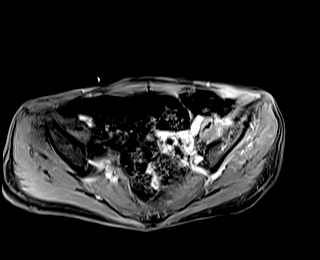
[im 80/80]
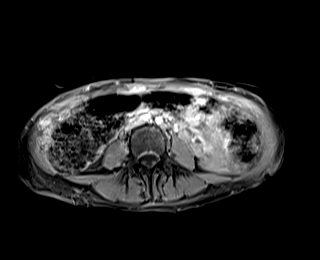

[Series 30: T1 dynamic post-contrast · sagittal · 3.0mm · 0.88mm/px · 3 of 80 slices shown (2 of 2)]
[im 1/80]
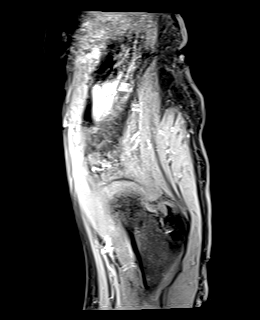
[im 40/80]
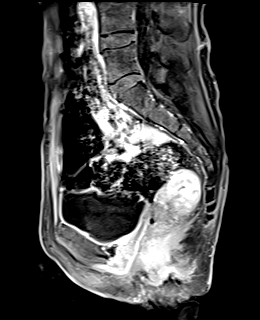
[im 80/80]
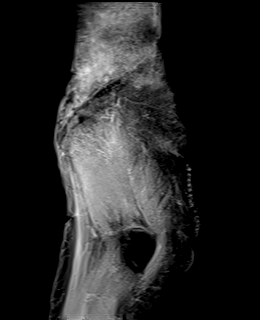

[Series 32: T2 post-contrast · sagittal · 5.0mm · 0.55mm/px · 1 of 40 slices shown]
[im 1/40]
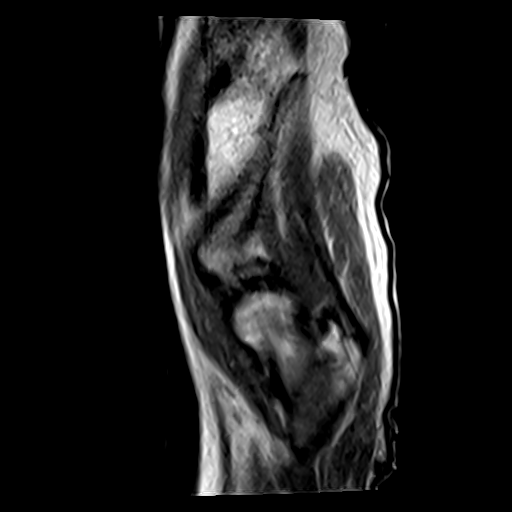

[46 of 48 positions shown; findings below may reference images not displayed]

FINDINGS: Examination is significantly degraded by patient motion and
patient's inability to follow commands due to altered mental status.
Within this context:

Urinary Tract: Urinary bladder is grossly unremarkable.

Bowel: Stool throughout the visualized colon likely reflects
constipation. No evidence of obstruction.

Vascular/Lymphatic: No pathologically enlarged lymph nodes or other
significant abnormality.

Reproductive:

Uterus: Intrauterine device.

Right ovary: The right ovary is not confidently identified in its
entirety. Cystic 2.6 cm right adnexal lesion is incompletely
evaluated on this study significantly limited by motion.

Left ovary:  Not confidently identified.

Other: Trace pelvic free fluid.

Musculoskeletal:  No gross abnormality.
IMPRESSION: Significantly limited study due to patient motion and patient's
inability to follow commands due to altered mental status. Within
this context:

1. Cystic 2.6 cm right adnexal lesion is identified without definite
suspicious postcontrast enhancement but is poorly evaluated on this
study significantly limited by motion. Recommend repeat pelvic MRI
with and without contrast preferably as a outpatient, when patient
is better able to tolerate the examination and follow commands such
as breath hold.
2. Trace pelvic free fluid.
3. Stool throughout the visualized colon likely reflects
constipation.

## 2021-03-08 MED ORDER — LORAZEPAM 2 MG/ML IJ SOLN
1.0000 mg | Freq: Once | INTRAMUSCULAR | Status: AC
  Administered 2021-03-08: 2 mg via INTRAVENOUS
  Filled 2021-03-08: qty 1

## 2021-03-08 MED ORDER — ENTECAVIR 0.5 MG PO TABS: 0.5000 mg | ORAL_TABLET | Freq: Every day | ORAL | Status: DC

## 2021-03-08 MED ORDER — AMOXICILLIN-POT CLAVULANATE 875-125 MG PO TABS: 1.0000 | ORAL_TABLET | Freq: Two times a day (BID) | ORAL | 0 refills | Status: AC

## 2021-03-08 MED ORDER — GADOBUTROL 1 MMOL/ML IV SOLN
5.5000 mL | Freq: Once | INTRAVENOUS | Status: AC | PRN
  Administered 2021-03-08: 5.5 mL via INTRAVENOUS

## 2021-03-08 MED ORDER — AMOXICILLIN-POT CLAVULANATE 875-125 MG PO TABS
1.0000 | ORAL_TABLET | Freq: Two times a day (BID) | ORAL | Status: DC
Start: 2021-03-08 — End: 2021-03-09
  Administered 2021-03-08 – 2021-03-09 (×3): 1 via ORAL
  Filled 2021-03-08 (×3): qty 1

## 2021-03-08 NOTE — Discharge Summary (Signed)
Physician Discharge Summary  Margaret [name] ZHY:[id number] DOB: April 12, 1970 DOA: 03/03/2021  PCP: Dettinger, Fransisca Kaufmann, MD  Admit date: 03/03/2021 Discharge date: 03/09/2021  Admitted From: [facility].  Disposition: Group Home.   Recommendations for Outpatient Follow-up:  Follow up with PCP in 1-2 weeks Please obtain BMP/CBC in one week Please follow up with gynecology for evaluation of the 3 cm cystic mass.   Discharge Condition:stable  CODE STATUS:full code.  Diet recommendation: Heart Healthy   Brief/Interim Summary:  50 year old lady with prior history of schizophrenia/impulse disorder,/intellectual disability, history of tuberculosis as a child, patient is a resident of group home, has an appointed guardian recently treated for recurrent aspiration pneumonia and empyema was discharged on Augmentin to complete the course on February 26, 2021 presents last night for fevers. She was admitted for sepsis with multifocal pneumonia.    Discharge Diagnoses:  Principal Problem:   Multifocal pneumonia Active Problems:   Schizophrenia ([place])   Complex ovarian cyst   Sepsis ([place])   Intellectual disability  Sepsis with multifocal pneumonia Patient came in febrile, tachycardic, tachypneic with significant leukocytosis, normal lactic acid. Recent prolonged hospitalization for aspiration pneumonia with right empyema requiring chest tube fibrinolytics and a pigtail catheter. Currently COVID and influenza negative. Chest x-ray showed multifocal pneumonia CT of the chest shows Moderate to marked severity bilateral upper lobe and bilateral lower lobe atelectasis and/or infiltrate. Started empirically on broad-spectrum antibiotics and transition to augmentin to complete the course.  Follow-up blood cultures and urine cultures. Lactic acid normal levels and procalcitonin is 0.22 T-max of 101 on 03/06/21, repeat CXR shows Persistent bilateral opacities with improved aeration at the left lung  base. previous blood cultures from admission have been negative so far. Repeat blood cultures have been negative so far.   Leukocytosis appear to be improving.  Pt remains afebrile, hence transition IV antibiotics to oral augmentin to complete the course.      Schizophrenia with history of intellectual disability Resume home medications. No clozapine continue with clozapine and Prozac Haldol ordered for agitation         Complex right ovarian cyst/right adnexal mass OB/GYN on board, recommended ultrasound of the pelvis which has been ordered , was not an optimal study MRI of the pelvis with and without contrast has been recommended which will be ordered at this time. Pending MRI    Severe protein calorie malnutrition Dietary will be consulted       Hypokalemia Replaced . Repeat level wnl.       Hyponatremia Probably secondary to dehydration and pneumonia Repeat sodium levels have improved       Anemia of chronic disease Hemoglobin around 8 and stable.  Discharge Instructions   Allergies as of 03/08/2021   No Known Allergies      Medication List     STOP taking these medications    benztropine 1 MG tablet Commonly known as: COGENTIN   CENTROVITE Tabs   chlorproMAZINE 50 MG tablet Commonly known as: THORAZINE   IBU 600 MG tablet Generic drug: ibuprofen   levofloxacin 500 MG tablet Commonly known as: LEVAQUIN       TAKE these medications    acetaminophen 650 MG CR tablet Commonly known as: TYLENOL Take 650 mg by mouth every 8 (eight) hours as needed for pain.   albuterol 108 (90 Base) MCG/ACT inhaler Commonly known as: Ventolin HFA INHALE 2 PUFFS EVERY 4 HOURS AS NEEDED FOR WHEEZING.   Amantadine HCl 100 MG tablet Take 100 mg by mouth  every morning.   amoxicillin-clavulanate 875-125 MG tablet Commonly known as: AUGMENTIN Take 1 tablet by mouth every 12 (twelve) hours for 5 days.   cloZAPine 100 MG tablet Commonly known as: CLOZARIL Take  200 mg by mouth at bedtime.   entecavir 0.5 MG tablet Commonly known as: BARACLUDE TAKE (1) TABLET BY MOUTH ONCE DAILY. What changed: See the new instructions.   feeding supplement (ENSURE COMPLETE) Liqd Take 237 mLs by mouth 2 (two) times daily between meals.   Fish Oil + D3 1200-1000 MG-UNIT Caps TAKE 1 CAPSULE BY MOUTH TWICE DAILY.   FLUoxetine 20 MG capsule Commonly known as: PROZAC TAKE 3 CAPSULES BY MOUTH ONCE DAILY.   fluticasone 50 MCG/ACT nasal spray Commonly known as: FLONASE SPRAY 2 SPRAYS IN RIGHT NOSTRIL ONLY TWICE DAILY.   guaiFENesin 600 MG 12 hr tablet Commonly known as: Mucinex Take 1 tablet (600 mg total) by mouth 2 (two) times daily.   hydrOXYzine 25 MG capsule Commonly known as: VISTARIL TAKE 1 CAPSULE BY MOUTH EVERY 8 HOURS AS NEEDED FOR ANXIETY   hydrOXYzine 25 MG tablet Commonly known as: ATARAX Take 25 mg by mouth every 8 (eight) hours.   levonorgestrel 20 MCG/24HR IUD Commonly known as: MIRENA 1 each by Intrauterine route once.   Minerin Creme Crea APPLY MODERATE AMOUNT TOPICALLY TO DRY SKIN ON SOLES & HEELS OF BOTH FEET AFTER SHOWER. (NURSING STAFF TO SUPERVISE APPLICATION) What changed: See the new instructions.   mirtazapine 15 MG tablet Commonly known as: REMERON Take 15 mg by mouth at bedtime. What changed: Another medication with the same name was removed. Continue taking this medication, and follow the directions you see here.   polyethylene glycol powder 17 GM/SCOOP powder Commonly known as: GLYCOLAX/MIRALAX Take 17 g by mouth daily. What changed:  when to take this reasons to take this   senna-docusate 8.6-50 MG tablet Commonly known as: Senokot-S Take 1 tablet by mouth 2 (two) times daily between meals as needed for mild constipation.   Vitamin D3 50 MCG (2000 UT) capsule TAKE 1 CAPSULE BY MOUTH EVERY MORNING.   vitamin E 200 UNIT capsule Take 1 capsule (200 Units total) by mouth daily.        No Known  Allergies  Consultations: None.    Procedures/Studies: DG Chest 1 View  Result Date: 02/08/2021 CLINICAL DATA:  Empyema with chest tube in place EXAM: CHEST  1 VIEW COMPARISON:  Prior chest x-ray 02/07/2021 FINDINGS: Right basilar pigtail thoracostomy tube in unchanged position. Small amount of residual pleural thickening versus pleural fluid persists. Increasing patchy airspace opacities in the periphery of the right lung. Persistent patchy airspace opacities in the left upper and mid lung without significant interval change. No pneumothorax. No acute osseous abnormality. Hardware consistent with prior ORIF of left clavicle and left humerus fractures remains in place. IMPRESSION: 1. Increasing patchy airspace opacities in the periphery of the right mid lung concerning for new atelectasis or pneumonia. 2. Stable and satisfactory position of right basilar pigtail thoracostomy tube. 3. Persistent advanced airspace opacities throughout the left mid and upper lung consistent with known multifocal pneumonia. Electronically Signed   By: Jacqulynn Cadet M.D.   On: 02/08/2021 08:14   DG Chest 2 View  Result Date: 03/06/2021 CLINICAL DATA:  Fever EXAM: CHEST - 2 VIEW COMPARISON:  03/03/2021 FINDINGS: Persistent bilateral interstitial and alveolar opacities. There is improved aeration at the left lung base. Small right pleural effusion. Stable cardiomediastinal contours. IMPRESSION: Persistent bilateral opacities with improved aeration  at the left lung base. Small right pleural effusion. Electronically Signed   By: Macy Mis M.D.   On: 03/06/2021 17:23   DG Chest 2 View  Result Date: 02/24/2021 CLINICAL DATA:  FU pleural effusion, asp pneumonia EXAM: CHEST - 2 VIEW COMPARISON:  February 13, 2021. FINDINGS: New patchy airspace opacities in the peripheral right midlung. Similar patchy opacities in the left lung. Small right pleural effusion. Emphysema. Cardiomediastinal silhouette is within normal  limits and similar. Left clavicular fixation. IMPRESSION: 1. New patchy airspace opacities in the peripheral right midlung and similar opacities in the left lung, concerning for multifocal pneumonia. Recommend follow-up imaging to resolution. 2. Small right pleural effusion. 3. Emphysema. Electronically Signed   By: Margaretha Sheffield M.D.   On: 02/24/2021 12:53   CT CHEST W CONTRAST  Result Date: 03/04/2021 CLINICAL DATA:  Follow-up abnormal chest plain film findings. EXAM: CT CHEST WITH CONTRAST TECHNIQUE: Multidetector CT imaging of the chest was performed during intravenous contrast administration. CONTRAST:  7m OMNIPAQUE IOHEXOL 350 MG/ML SOLN COMPARISON:  February 06, 2023 FINDINGS: Cardiovascular: No significant vascular findings. Normal heart size with moderate to marked severity coronary artery calcification. No pericardial effusion. Mediastinum/Nodes: Subcentimeter calcified subcarinal lymph nodes are seen. Thyroid gland, trachea, and esophagus demonstrate no significant findings. Lungs/Pleura: Moderate to marked severity areas of atelectasis and/or infiltrate are seen within the bilateral upper lobes and bilateral lower lobes. This is decreased in severity within the left upper lobe, and increased in severity within the right upper lobe and left lower lobe when compared to the prior study. Involvement of the right lower lobe is family stable. Small bilateral pleural effusions are seen, right slightly greater than left. No pneumothorax is identified. Upper Abdomen: No acute abnormality. Musculoskeletal: Multiple chronic left-sided rib fractures are seen. A metallic density fixation plate and screws are seen along the left clavicle. Mild, multilevel degenerative changes are noted throughout the thoracic spine. Other: The study is limited secondary to the presence of beam hardening artifact from a metallic density fixation plate and screws present within the mid to distal left humeral shaft. IMPRESSION:  1. Moderate to marked severity bilateral upper lobe and bilateral lower lobe atelectasis and/or infiltrate, as described above. 2. Small bilateral pleural effusions, right slightly greater than left. 3. Moderate to marked severity coronary artery calcification. Electronically Signed   By: TVirgina NorfolkM.D.   On: 03/04/2021 04:32   UK[place]PELVIS (TRANSABDOMINAL ONLY)  Result Date: 03/04/2021 CLINICAL DATA:  Adnexal mass seen on CT 01/21/2021 EXAM: TRANSABDOMINAL ULTRASOUND OF PELVIS TECHNIQUE: Transabdominal ultrasound examination of the pelvis was performed including evaluation of the uterus, ovaries, adnexal regions, and pelvic cul-de-sac. COMPARISON:  None. FINDINGS: Evaluation is markedly limited due to patient incontinence and unable to fill bladder, and inability to consent for transvaginal exam. Uterus Not identified. Endometrium Not identified. Right ovary Not identified. Left ovary Not identified. Other findings:  No definite free fluid was seen. IMPRESSION: Limited exam due to patient incontinence and inability to fill bladder, and inability to consent for transvaginal exam. The uterus and bilateral ovaries could not be identified. Consider pelvic nonemergent MRI with and without contrast to better evaluate the right adnexal lesion seen on prior CT. Electronically Signed   By: PValetta MoleM.D.   On: 03/04/2021 14:20   DG Chest Portable 1 View  Result Date: 03/03/2021 CLINICAL DATA:  Fever, recent diagnosis pneumonia EXAM: PORTABLE CHEST 1 VIEW COMPARISON:  02/24/2021 FINDINGS: The heart size and mediastinal contours are within normal  limits. Similar appearance of small bilateral pleural effusions. Unchanged heterogeneous airspace opacity throughout the lungs. Plate and screw fixation of the left clavicle. IMPRESSION: Unchanged heterogeneous airspace opacity throughout the lungs and small bilateral pleural effusions. No new airspace opacity. Electronically Signed   By: Delanna Ahmadi M.D.   On:  03/03/2021 14:15   DG CHEST PORT 1 VIEW  Result Date: 02/13/2021 CLINICAL DATA:  Encounter for chest tube removal EXAM: PORTABLE CHEST 1 VIEW COMPARISON:  February 12, 2021 FINDINGS: No pneumothorax is identified. Patchy consolidation of the left upper lobe is slightly improved. There is minimal right pleural effusion. Nodularity of the right upper lobe is unchanged. Emphysematous changes of both lungs are identified. The mediastinal contour and cardiac silhouette are stable. Bony structures are stable. IMPRESSION: No pneumothorax. Patchy consolidation of the left upper lobe is slightly improved. Nodularity of the right upper lobe is unchanged. Electronically Signed   By: Abelardo Diesel M.D.   On: 02/13/2021 08:30   DG CHEST PORT 1 VIEW  Result Date: 02/12/2021 CLINICAL DATA:  Pleural effusion, difficulty breathing EXAM: PORTABLE CHEST 1 VIEW COMPARISON:  Previous studies including the examination of 02/11/2021 FINDINGS: Heart is within normal limits. Low position of diaphragms suggests COPD. There is interval removal of right chest tube. Small right pleural effusion is seen. There is blunting of left lateral CP angle. There is no pneumothorax. There are patchy infiltrates in the parahilar regions, more so on the left side. Increased markings are seen in both lower lung fields with no significant interval change. IMPRESSION: There is interval removal of right chest tube. Small bilateral pleural effusions. There is no pneumothorax. COPD. There are patchy infiltrates in both lungs suggesting pneumonia. Part of this finding may suggest underlying scarring. Electronically Signed   By: Elmer Picker M.D.   On: 02/12/2021 10:06   DG Chest Port 1 View  Result Date: 02/12/2021 CLINICAL DATA:  The right pleural effusion with chest tube in place EXAM: PORTABLE CHEST 1 VIEW COMPARISON:  Prior chest x-ray 02/11/2021 FINDINGS: Pigtail thoracostomy tube remains in good position overlying the right lung base.  Slight interval improvement in the appearance of the chest with decreasing patchy airspace opacities throughout the left mid lung and scattered throughout the right lung. Stable pleural thickening along the margin of the right lower lobe. Cardiac and mediastinal contours are unchanged. No acute osseous abnormality. Postsurgical changes of the clavicle again noted. IMPRESSION: 1. Slight interval improvement in aeration with decreasing patchy airspace opacities bilaterally. 2. Stable position of pigtail thoracostomy tube. 3. No new or acute abnormality identified. Electronically Signed   By: Jacqulynn Cadet M.D.   On: 02/12/2021 06:34   DG CHEST PORT 1 VIEW  Result Date: 02/11/2021 CLINICAL DATA:  Chest tube for empyema treatment EXAM: PORTABLE CHEST 1 VIEW COMPARISON:  02/09/2021 FINDINGS: Heart and mediastinal shadows are unchanged. Widespread patchy pulmonary opacities persist, most pronounced in the left midlung. Right pleural catheter remains in place. There is only a small amount of regional right pleural density, unchanged. IMPRESSION: No significant change. Widespread patchy pulmonary densities. Right pleural catheter in place with a small amount of pleural density, unchanged. Electronically Signed   By: Nelson Chimes M.D.   On: 02/11/2021 08:19   DG CHEST PORT 1 VIEW  Result Date: 02/09/2021 CLINICAL DATA:  Pneumonia. Additional history provided: Pneumonia, chest tube. EXAM: PORTABLE CHEST 1 VIEW COMPARISON:  Prior chest radiographs 02/08/2021 and earlier. FINDINGS: Unchanged position of a right basilar chest tube. Unchanged small right  pleural effusion. Persistent ill-defined opacity within the right mid lung, which may reflect atelectasis or pneumonia. Redemonstrated airspace opacity within the left upper to mid lung field, similar to slightly improved, and compatible with pneumonia. No sizable left pleural effusion. No evidence of pneumothorax. No acute bony abnormality identified. Prior ORIF of  the left clavicle. IMPRESSION: Airspace disease within the left upper-to-mid lung field, similar to slightly improved as compared to the prior chest radiograph of 02/08/2021. Findings are compatible with the provided history of pneumonia. Similar appearance of ill-defined opacity within the right mid-lung, which may reflect atelectasis or pneumonia. Stable position of a right basilar chest tube with unchanged small right pleural effusion. No evidence of pneumothorax. Electronically Signed   By: Kellie Simmering D.O.   On: 02/09/2021 10:37   DG Chest Port 1 View  Result Date: 02/07/2021 CLINICAL DATA:  Empyema EXAM: PORTABLE CHEST 1 VIEW COMPARISON:  Chest radiograph from one day prior. FINDINGS: Stable right basilar pigtail chest tube. Fixation hardware in the left clavicle. Stable cardiomediastinal silhouette with normal heart size. No pneumothorax. Stable basilar right pleural thickening/small effusion. Stable basilar left pleural thickening/small effusion. Patchy opacities throughout the left greater than right lungs, slightly improved. IMPRESSION: 1. No pneumothorax. Stable right basilar pigtail chest tube. 2. Stable bilateral basilar pleural thickening/small effusions. 3. Patchy opacities in the left greater than right lungs, slightly improved. Electronically Signed   By: Ilona Sorrel M.D.   On: 02/07/2021 07:16   DG ESOPHAGUS W SINGLE CM (SOL OR THIN BA)  Result Date: 02/08/2021 CLINICAL DATA:  Difficulty swallowing food. EXAM: ESOPHAGUS/BARIUM SWALLOW/TABLET STUDY TECHNIQUE: Single contrast examination was performed using thin liquid barium. This exam was performed by Rushie Nyhan, and was supervised and interpreted by Lorin Picket, MD. FLUOROSCOPY TIME:  Radiation Exposure Index (as provided by the fluoroscopic device): If the device does not provide the exposure index: Fluoroscopy Time:  2 minutes 12 seconds. Number of Acquired Images:  None. COMPARISON:  NONE. FINDINGS: Swallowing: Appears  normal. No vestibular penetration or aspiration seen. Pharynx: Unremarkable. Esophagus: There may be mild esophageal fold thickening. Esophageal motility: Within normal limits. Hiatal Hernia: None. Gastroesophageal reflux: None. Ingested 18m barium tablet: A 13 mm barium tablet would not pass beyond the gastroesophageal junction, suggesting very minimal narrowing. No high-grade stricture. Other: None. IMPRESSION: 1. A 13 mm barium tablet would not pass beyond the gastroesophageal junction, suggesting very minimal narrowing. No high-grade stricture. 2. Probable mild esophageal fold thickening. Electronically Signed   By: MLorin PicketM.D.   On: 02/08/2021 10:24      Subjective: Cough is better, little restless.   Discharge Exam: Vitals:   03/07/21 1955 03/08/21 0342  BP: 97/72 122/69  Pulse: 79 88  Resp: 20 20  Temp: 99.7 F (37.6 C) 98 F (36.7 C)  SpO2: 99% 93%   Vitals:   03/07/21 0500 03/07/21 1430 03/07/21 1955 03/08/21 0342  BP: 98/63 (!) 107/51 97/72 122/69  Pulse: 90 83 79 88  Resp: 20 20 20 20   Temp: 98.3 F (36.8 C) 99.2 F (37.3 C) 99.7 F (37.6 C) 98 F (36.7 C)  TempSrc: Oral Oral Oral Oral  SpO2: 97% 98% 99% 93%  Weight:      Height:        General: Pt is alert, awake, not in acute distress Cardiovascular: RRR, S1/S2 +, no rubs, no gallops Respiratory: CTA bilaterally, no wheezing, no rhonchi Abdominal: Soft, NT, ND, bowel sounds + Extremities: no edema, no cyanosis  The results of significant diagnostics from this hospitalization (including imaging, microbiology, ancillary and laboratory) are listed below for reference.     Microbiology: Recent Results (from the past 240 hour(s))  Resp Panel by RT-PCR (Flu A&B, Covid) Nasopharyngeal Swab     Status: None   Collection Time: 03/03/21  1:45 PM   Specimen: Nasopharyngeal Swab; Nasopharyngeal(NP) swabs in vial transport medium  Result Value Ref Range Status   SARS Coronavirus 2 by RT PCR NEGATIVE  NEGATIVE Final    Comment: (NOTE) SARS-CoV-2 target nucleic acids are NOT DETECTED.  The SARS-CoV-2 RNA is generally detectable in upper respiratory specimens during the acute phase of infection. The lowest concentration of SARS-CoV-2 viral copies this assay can detect is 138 copies/mL. A negative result does not preclude SARS-Cov-2 infection and should not be used as the sole basis for treatment or other patient management decisions. A negative result may occur with  improper specimen collection/handling, submission of specimen other than nasopharyngeal swab, presence of viral mutation(s) within the areas targeted by this assay, and inadequate number of viral copies(<138 copies/mL). A negative result must be combined with clinical observations, patient history, and epidemiological information. The expected result is Negative.  Fact Sheet for Patients:  EntrepreneurPulse.com.au  Fact Sheet for Healthcare Providers:  IncredibleEmployment.be  This test is no t yet approved or cleared by the [place] FDA and  has been authorized for detection and/or diagnosis of SARS-CoV-2 by FDA under an Emergency Use Authorization (EUA). This EUA will remain  in effect (meaning this test can be used) for the duration of the COVID-19 declaration under Section 564(b)(1) of the Act, 21 U.S.C.section 360bbb-3(b)(1), unless the authorization is terminated  or revoked sooner.       Influenza A by PCR NEGATIVE NEGATIVE Final   Influenza B by PCR NEGATIVE NEGATIVE Final    Comment: (NOTE) The Xpert Xpress SARS-CoV-2/FLU/RSV plus assay is intended as an aid in the diagnosis of influenza from Nasopharyngeal swab specimens and should not be used as a sole basis for treatment. Nasal washings and aspirates are unacceptable for Xpert Xpress SARS-CoV-2/FLU/RSV testing.  Fact Sheet for Patients: EntrepreneurPulse.com.au  Fact Sheet for Healthcare  Providers: IncredibleEmployment.be  This test is not yet approved or cleared by the [place] FDA and has been authorized for detection and/or diagnosis of SARS-CoV-2 by FDA under an Emergency Use Authorization (EUA). This EUA will remain in effect (meaning this test can be used) for the duration of the COVID-19 declaration under Section 564(b)(1) of the Act, 21 U.S.C. section 360bbb-3(b)(1), unless the authorization is terminated or revoked.  Performed at [facility], [address] [place], [place] [address]   Culture, blood (Routine x 2)     Status: None   Collection Time: 03/03/21  2:33 PM   Specimen: BLOOD LEFT WRIST  Result Value Ref Range Status   Specimen Description BLOOD LEFT WRIST  Final   Special Requests   Final    BOTTLES DRAWN AEROBIC AND ANAEROBIC Blood Culture results may not be optimal due to an inadequate volume of blood received in culture bottles   Culture   Final    NO GROWTH 5 DAYS Performed at [facility], [address], [place], [place] [address]    Report Status 03/08/2021 FINAL  Final  Culture, blood (Routine x 2)     Status: None   Collection Time: 03/03/21  3:14 PM   Specimen: BLOOD LEFT ARM  Result Value Ref Range Status   Specimen Description BLOOD  LEFT ARM  Final   Special Requests   Final    BOTTLES DRAWN AEROBIC AND ANAEROBIC Blood Culture results may not be optimal due to an excessive volume of blood received in culture bottles   Culture   Final    NO GROWTH 5 DAYS Performed at [facility], [address], [place], [place] [address]    Report Status 03/08/2021 FINAL  Final  Urine Culture     Status: None   Collection Time: 03/04/21  4:00 AM   Specimen: Urine, Clean Catch  Result Value Ref Range Status   Specimen Description   Final    URINE, CLEAN CATCH Performed at [facility], [place] [address] [place], [place] [address]    Special Requests   Final    NONE Performed at [facility], [place] [address] [place], [place] [address]    Culture   Final    NO GROWTH Performed at [facility], [place] [address], [place], [place] [address]    Report Status 03/05/2021 FINAL  Final  MRSA Next Gen by PCR, Nasal     Status: None   Collection Time: 03/04/21 11:41 AM   Specimen: Nasal Mucosa; Nasal Swab  Result Value Ref Range Status   MRSA by PCR Next Gen NOT DETECTED NOT DETECTED Final    Comment: (NOTE) The GeneXpert MRSA Assay (FDA approved for NASAL specimens only), is one component of a comprehensive MRSA colonization surveillance program. It is not intended to diagnose MRSA infection nor to guide or monitor treatment for MRSA infections. Test performance is not FDA approved in patients less than 85 years old. Performed at [facility], [place] [address], [place], [place] [address]   Culture, blood (Routine X 2) w Reflex to ID Panel     Status: None (Preliminary result)   Collection Time: 03/06/21  4:07 PM   Specimen: BLOOD  Result Value Ref Range Status   Specimen Description   Final    BLOOD BLOOD RIGHT HAND Performed at [facility] [address] [place], [place] [address]    Special Requests   Final    BOTTLES DRAWN AEROBIC ONLY Blood Culture adequate volume Performed at [facility] [address] [place], [place] [address]    Culture   Final    NO GROWTH 2 DAYS Performed at [facility] [address], [place], [place] [address]    Report Status PENDING  Incomplete  Culture, blood (Routine X 2) w Reflex to ID Panel     Status: None (Preliminary result)   Collection Time: 03/06/21  4:07 PM   Specimen: BLOOD  Result Value Ref Range Status   Specimen Description   Final    BLOOD RIGHT ANTECUBITAL Performed at [facility] [address], [place], [place] [address]    Special Requests   Final    BOTTLES DRAWN AEROBIC ONLY Blood Culture adequate  volume Performed at [facility] [address] [place], [place] [address]    Culture   Final    NO GROWTH 2 DAYS Performed at [facility] [address], [place], [place] [address]    Report Status PENDING  Incomplete     Labs: BNP (last 3 results) Recent Labs    02/01/21 2146  BNP 32.2   Basic Metabolic Panel: Recent Labs  Lab 03/03/21 1435 03/04/21 0447 03/05/21 0541 03/06/21 0618 03/07/21 0541  NA 133* 128* 136 138 136  K 3.7 3.3* 3.6 3.2* 4.5  CL 101 101 105 103 105  CO2 23 20* 24 27 28   GLUCOSE 108* 140* 95 121* 84  BUN 12 10 7 7 9   CREATININE 0.48 0.38* 0.45 0.44 0.47  CALCIUM 8.4* 7.6* 7.9* 8.5* 8.8*  MG  --   --   --  1.8  --    Liver Function Tests: Recent Labs  Lab 03/03/21 1435  AST 17  ALT 16  ALKPHOS 87  BILITOT 0.5  PROT 6.4*  ALBUMIN 2.7*   No results for input(s): LIPASE, AMYLASE in the last 168 hours. No results for input(s): AMMONIA in the last 168 hours. CBC: Recent Labs  Lab 03/03/21 1435 03/04/21 0447 03/05/21 0541 03/06/21 0618 03/07/21 0541  WBC 24.8* 23.4* 21.1* 17.3* 15.1*  NEUTROABS 22.1*  --   --   --   --   HGB 9.1* 8.4* 8.7* 9.2* 8.6*  HCT 29.5* 26.7* 28.9* 30.3* 28.0*  MCV 88.1 86.7 87.3 85.8 84.8  PLT 280 238 281 318 335   Cardiac Enzymes: No results for input(s): CKTOTAL, CKMB, CKMBINDEX, TROPONINI in the last 168 hours. BNP: Invalid input(s): POCBNP CBG: No results for input(s): GLUCAP in the last 168 hours. D-Dimer No results for input(s): DDIMER in the last 72 hours. Hgb A1c No results for input(s): HGBA1C in the last 72 hours. Lipid Profile No results for input(s): CHOL, HDL, LDLCALC, TRIG, CHOLHDL, LDLDIRECT in the last 72 hours. Thyroid function studies No results for input(s): TSH, T4TOTAL, T3FREE, THYROIDAB in the last 72 hours.  Invalid input(s): FREET3 Anemia work up No results for input(s): VITAMINB12, FOLATE, FERRITIN, TIBC, IRON, RETICCTPCT in the last 72  hours. Urinalysis    Component Value Date/Time   COLORURINE YELLOW 03/04/2021 0400   APPEARANCEUR CLEAR 03/04/2021 0400   APPEARANCEUR Clear 06/30/2016 1013   LABSPEC 1.018 03/04/2021 0400   PHURINE 5.0 03/04/2021 0400   GLUCOSEU NEGATIVE 03/04/2021 0400   HGBUR NEGATIVE 03/04/2021 0400   BILIRUBINUR NEGATIVE 03/04/2021 0400   BILIRUBINUR Negative 06/30/2016 1013   KETONESUR 20 (A) 03/04/2021 0400   PROTEINUR NEGATIVE 03/04/2021 0400   UROBILINOGEN 0.2 04/13/2011 0330   NITRITE NEGATIVE 03/04/2021 0400   LEUKOCYTESUR LARGE (A) 03/04/2021 0400   Sepsis Labs Invalid input(s): PROCALCITONIN,  WBC,  LACTICIDVEN Microbiology Recent Results (from the past 240 hour(s))  Resp Panel by RT-PCR (Flu A&B, Covid) Nasopharyngeal Swab     Status: None   Collection Time: 03/03/21  1:45 PM   Specimen: Nasopharyngeal Swab; Nasopharyngeal(NP) swabs in vial transport medium  Result Value Ref Range Status   SARS Coronavirus 2 by RT PCR NEGATIVE NEGATIVE Final    Comment: (NOTE) SARS-CoV-2 target nucleic acids are NOT DETECTED.  The SARS-CoV-2 RNA is generally detectable in upper respiratory specimens during the acute phase of infection. The lowest concentration of SARS-CoV-2 viral copies this assay can detect is 138 copies/mL. A negative result does not preclude SARS-Cov-2 infection and should not be used as the sole basis for treatment or other patient management decisions. A negative result may occur with  improper specimen collection/handling, submission of specimen other than nasopharyngeal swab, presence of viral mutation(s) within the areas targeted by this assay, and inadequate number of viral copies(<138 copies/mL). A negative result must be combined with clinical observations, patient history, and epidemiological information. The expected result is Negative.  Fact Sheet for Patients:  EntrepreneurPulse.com.au  Fact Sheet for Healthcare Providers:   IncredibleEmployment.be  This test is no t yet approved or cleared by the [place] and  has been authorized for detection and/or diagnosis of SARS-CoV-2 by FDA under an Emergency Use Authorization (EUA). This EUA will remain  in effect (meaning this test can be used) for the duration of the COVID-19 declaration under Section 564(b)(1) of the Act, 21 U.S.C.section 360bbb-3(b)(1), unless the authorization is terminated  or revoked sooner.       Influenza A by PCR NEGATIVE NEGATIVE Final   Influenza B by PCR NEGATIVE NEGATIVE Final    Comment: (NOTE) The Xpert Xpress SARS-CoV-2/FLU/RSV plus assay is intended as an aid in the diagnosis of influenza from Nasopharyngeal swab specimens and should not be used as a sole basis for treatment. Nasal washings and aspirates are unacceptable for Xpert Xpress SARS-CoV-2/FLU/RSV testing.  Fact Sheet for Patients: EntrepreneurPulse.com.au  Fact Sheet for Healthcare Providers: IncredibleEmployment.be  This test is not yet approved or cleared by the [place] FDA and has been authorized for detection and/or diagnosis of SARS-CoV-2 by FDA under an Emergency Use Authorization (EUA). This EUA will remain in effect (meaning this test can be used) for the duration of the COVID-19 declaration under Section 564(b)(1) of the Act, 21 U.S.C. section 360bbb-3(b)(1), unless the authorization is terminated or revoked.  Performed at [facility], [address], [place], [place] [address]   Culture, blood (Routine x 2)     Status: None   Collection Time: 03/03/21  2:33 PM   Specimen: BLOOD LEFT WRIST  Result Value Ref Range Status   Specimen Description BLOOD LEFT WRIST  Final   Special Requests   Final    BOTTLES DRAWN AEROBIC AND ANAEROBIC Blood Culture results may not be optimal due to an inadequate volume of blood received in culture bottles   Culture   Final    NO GROWTH 5  DAYS Performed at [facility], [address] [place], [place] [address]    Report Status 03/08/2021 FINAL  Final  Culture, blood (Routine x 2)     Status: None   Collection Time: 03/03/21  3:14 PM   Specimen: BLOOD LEFT ARM  Result Value Ref Range Status   Specimen Description BLOOD LEFT ARM  Final   Special Requests   Final    BOTTLES DRAWN AEROBIC AND ANAEROBIC Blood Culture results may not be optimal due to an excessive volume of blood received in culture bottles   Culture   Final    NO GROWTH 5 DAYS Performed at [facility], [address], [place], [place] [address]    Report Status 03/08/2021 FINAL  Final  Urine Culture     Status: None   Collection Time: 03/04/21  4:00 AM   Specimen: Urine, Clean Catch  Result Value Ref Range Status   Specimen Description   Final    URINE, CLEAN CATCH Performed at [facility], [place] [address] [place], [place] [address]    Special Requests   Final    NONE Performed at [facility], [place] [address] [place], [place] [address]    Culture   Final    NO GROWTH Performed at [facility], [place] [address] [place],  [address]    Report Status 03/05/2021 FINAL  Final  MRSA Next Gen by PCR, Nasal     Status: None   Collection Time: 03/04/21 11:41 AM   Specimen: Nasal Mucosa; Nasal Swab  Result Value Ref Range Status   MRSA by  PCR Next Gen NOT DETECTED NOT DETECTED Final    Comment: (NOTE) The GeneXpert MRSA Assay (FDA approved for NASAL specimens only), is one component of a comprehensive MRSA colonization surveillance program. It is not intended to diagnose MRSA infection nor to guide or monitor treatment for MRSA infections. Test performance is not FDA approved in patients less than 28 years old. Performed at [facility], [place] [address], [place], [place] [address]   Culture, blood (Routine X 2) w Reflex to ID Panel     Status: None (Preliminary result)   Collection  Time: 03/06/21  4:07 PM   Specimen: BLOOD  Result Value Ref Range Status   Specimen Description   Final    BLOOD BLOOD RIGHT HAND Performed at [facility] [address] [place], [place] [address]    Special Requests   Final    BOTTLES DRAWN AEROBIC ONLY Blood Culture adequate volume Performed at [facility] [address] [place], [place] [address]    Culture   Final    NO GROWTH 2 DAYS Performed at [facility] [address], [place], [place] [address]    Report Status PENDING  Incomplete  Culture, blood (Routine X 2) w Reflex to ID Panel     Status: None (Preliminary result)   Collection Time: 03/06/21  4:07 PM   Specimen: BLOOD  Result Value Ref Range Status   Specimen Description   Final    BLOOD RIGHT ANTECUBITAL Performed at [facility] [address], [place], [place] [address]    Special Requests   Final    BOTTLES DRAWN AEROBIC ONLY Blood Culture adequate volume Performed at [facility] [address], [place], [place] [address]    Culture   Final    NO GROWTH 2 DAYS Performed at [facility] [address], [place], [place] [address]    Report Status PENDING  Incomplete     Time coordinating discharge: 39 minutes.   SIGNED:   Hosie Poisson, MD  [facility] 03/08/2021, 11:50 AM

## 2021-03-08 NOTE — NC FL2 (Signed)
[place] LEVEL OF CARE SCREENING TOOL     IDENTIFICATION  Patient Name: Margaret [name] Birthdate: 01/18/1971 Sex: female Admission Date (Current Location): 03/03/2021  [facility] and [place] Number:  Herbalist and Address:  [facility],  [place], [place]      Provider Number: [id number]  Attending Physician Name and Address:  Hosie Poisson, MD  Relative Name and Phone Number:  Maury Dus (Legal Guardian)   [phone]    Current Level of Care: Hospital Recommended Level of Care: [facility] Prior Approval Number:    Date Approved/Denied:   PASRR Number:    Discharge Plan: Domiciliary (Rest home) ([facility])    Current Diagnoses: Patient Active Problem List   Diagnosis Date Noted   Multifocal pneumonia 03/03/2021   Sepsis ([place]) 03/03/2021   Intellectual disability 03/03/2021   Right adrenal mass ([place]) 02/24/2021   Poor appetite 02/24/2021   Protein-calorie malnutrition, severe 02/10/2021   Acute respiratory failure with hypoxia ([place]) 02/01/2021   Centrilobular emphysema ([place]) 02/01/2021   Acute pulmonary edema ([place]) 02/01/2021   Community acquired pneumonia 01/30/2021   Pleural effusion 01/30/2021   Hypokalemia 01/30/2021   Psychiatric disorder 01/30/2021   Leukocytosis 01/30/2021   Complex ovarian cyst 01/25/2021   Multiple pulmonary nodules 01/25/2021   RLQ abdominal mass 01/05/2021   Vitamin D deficiency 11/30/2020   Weight loss 03/19/2019   Schizophrenia ([place])    Hepatitis B    Hyperlipidemia 12/18/2014   History of tuberculosis 11/17/2014   Orthostatic hypotension 11/17/2014   Right nasal polyps 11/17/2014   IUD (intrauterine device) in place 11/12/2014   Chronic hepatitis B ([place]) 11/10/2014    Orientation RESPIRATION BLADDER Height & Weight     Self, Place  Normal Continent Weight: 50.5 kg Height:  5' 4"  (162.6 cm)  BEHAVIORAL SYMPTOMS/MOOD NEUROLOGICAL BOWEL  NUTRITION STATUS      Continent Diet (regular)  AMBULATORY STATUS COMMUNICATION OF NEEDS Skin   Independent Verbally Normal                       Personal Care Assistance Level of Assistance  Bathing, Feeding, Dressing Bathing Assistance: Independent Feeding assistance: Independent Dressing Assistance: Independent     Functional Limitations Info  Sight, Hearing, Speech Sight Info: Impaired Hearing Info: Adequate Speech Info: Adequate    SPECIAL CARE FACTORS FREQUENCY                       Contractures Contractures Info: Not present    Additional Factors Info  Code Status, Allergies Code Status Info: full Allergies Info: NKA             Discharge Medications:  acetaminophen 650 MG CR tablet Commonly known as: TYLENOL Take 650 mg by mouth every 8 (eight) hours as needed for pain.          albuterol 108 (90 Base) MCG/ACT inhaler Commonly known as: Ventolin HFA INHALE 2 PUFFS EVERY 4 HOURS AS NEEDED FOR WHEEZING.          Amantadine HCl 100 MG tablet Take 100 mg by mouth every morning.         Icon medications to start taking   amoxicillin-clavulanate 875-125 MG tablet Commonly known as: AUGMENTIN Take 1 tablet by mouth every 12 (twelve) hours for 5 days.          cloZAPine 100 MG tablet Commonly known as: CLOZARIL Take 200 mg by  mouth at bedtime.          entecavir 0.5 MG tablet Commonly known as: BARACLUDE TAKE (1) TABLET BY MOUTH ONCE DAILY.          feeding supplement (ENSURE COMPLETE) Liqd Take 237 mLs by mouth 2 (two) times daily between meals.          Fish Oil + D3 1200-1000 MG-UNIT Caps TAKE 1 CAPSULE BY MOUTH TWICE DAILY.          FLUoxetine 20 MG capsule Commonly known as: PROZAC TAKE 3 CAPSULES BY MOUTH ONCE DAILY.          fluticasone 50 MCG/ACT nasal spray Commonly known as: FLONASE SPRAY 2 SPRAYS IN RIGHT NOSTRIL ONLY TWICE DAILY.          guaiFENesin 600 MG 12 hr tablet Commonly known as: Mucinex Take 1 tablet (600 mg total) by  mouth 2 (two) times daily.         Icon medications to start taking   hydrOXYzine 25 MG capsule Commonly known as: VISTARIL TAKE 1 CAPSULE BY MOUTH EVERY 8 HOURS AS NEEDED FOR ANXIETY          hydrOXYzine 25 MG tablet Commonly known as: ATARAX Take 25 mg by mouth every 8 (eight) hours.          levonorgestrel 20 MCG/24HR IUD Commonly known as: MIRENA 1 each by Intrauterine route once.          Minerin Creme Crea APPLY MODERATE AMOUNT TOPICALLY TO DRY SKIN ON SOLES & HEELS OF BOTH FEET AFTER SHOWER. (NURSING STAFF TO SUPERVISE APPLICATION)         Icon medications to change how you take   mirtazapine 15 MG tablet Commonly known as: REMERON Take 15 mg by mouth at bedtime. What changed: Another medication with the same name was removed. Continue taking this medication, and follow the directions you see here.          polyethylene glycol powder 17 GM/SCOOP powder Commonly known as: GLYCOLAX/MIRALAX Take 17 g by mouth daily.          senna-docusate 8.6-50 MG tablet Commonly known as: Senokot-S Take 1 tablet by mouth 2 (two) times daily between meals as needed for mild constipation.          Vitamin D3 50 MCG (2000 UT) capsule TAKE 1 CAPSULE BY MOUTH EVERY MORNING.          vitamin E 200 UNIT capsule Take 1 capsule (200 Units total) by mouth daily.           Relevant Imaging Results:  Relevant Lab Results:   Additional [place], LCSW

## 2021-03-08 NOTE — TOC Progression Note (Addendum)
Transition of Care [name]) - Progression Note    Patient Details  Name: Margaret [name] MRN: [id number] Date of Birth: October 23, 1970  Transition of Care [facility]) CM/SW [employer], [place] Phone Number: 03/08/2021, 11:20 AM  Clinical Narrative:   Spoke with Ms [place] at [facility], [phone], to let her know that patient is stable for d/c.  She requested FL2, d/c summary and stated they could pick up after 2PM. MD alerted. TOC will continue to follow during the course of hospitalization.  Addendum: Received c/b from Ms [place] asking that I call [name] at [phone]. [name] states that it is their policy to do face to face evaluation of patient. She cannot come until tomorrow AM first thing.  She would like FL2 and d/c summary today so that she knows if there are any new meds, and states she can transport patient home at that time if there are no further questions/concerns and if all is ready on our end.  When I pushed back on holding patient, she informed me it is a state requirement. MD informed.     Expected Discharge Plan: Group Home Barriers to Discharge: No Barriers Identified  Expected Discharge Plan and Services Expected Discharge Plan: Group Home   Discharge Planning Services: CM Consult   Living arrangements for the past 2 months: Group Home                                       Social Determinants of Health (SDOH) Interventions    Readmission Risk Interventions No flowsheet data found.  [place]

## 2021-03-09 MED ORDER — POLYETHYLENE GLYCOL 3350 17 G PO PACK
17.0000 g | PACK | Freq: Two times a day (BID) | ORAL | Status: DC
  Administered 2021-03-09: 17 g via ORAL
  Filled 2021-03-09: qty 1

## 2021-03-09 MED ORDER — SENNOSIDES-DOCUSATE SODIUM 8.6-50 MG PO TABS
2.0000 | ORAL_TABLET | Freq: Two times a day (BID) | ORAL | Status: DC
  Administered 2021-03-09: 2 via ORAL
  Filled 2021-03-09: qty 2

## 2021-03-09 NOTE — Progress Notes (Signed)
Legal guardian notified, via telephone. Notification unsuccessful. This RN left name and callback number on voicemail. AVS printed. Discharge summary printed. [facility] educated on AVS. PIV removed. No further questions at this time.

## 2021-03-09 NOTE — Care Management Important Message (Signed)
Important Message  Patient Details IM Letter placed in Patients room. Name: Margaret [name] MRN: [id number] Date of Birth: November 24, 1970   Medicare Important Message Given:  Yes     Kerin Salen 03/09/2021, 11:03 AM

## 2021-03-10 ENCOUNTER — Telehealth: Payer: Self-pay | Admitting: Family Medicine

## 2021-03-10 NOTE — Telephone Encounter (Addendum)
Transition Care Management Unsuccessful Follow-up Telephone Call  Date of discharge and from where:  03/09/21 - [facility] - sepsis due to pneumonia  Attempts:  1st Attempt  Reason for unsuccessful TCM follow-up call:  Voice mail full on [place]'s # - she called this morning to make the appt   Transition Care Management Unsuccessful Follow-up Telephone Call  Date of discharge and from where:  03/09/21 - [facility] - sepsis due to pneumonia  Attempts:  2nd Attempt  Reason for unsuccessful TCM follow-up call:  Left voice message     Transition Care Management Unsuccessful Follow-up Telephone Call  Date of discharge and from where:  [facility] 03/09/21 - sepsis due to pneumonia  Attempts:  3rd Attempt  Reason for unsuccessful TCM follow-up call:  Left voice message

## 2021-03-10 NOTE — Telephone Encounter (Signed)
Pt needs a hospital follow up. Please call back.

## 2021-03-11 ENCOUNTER — Ambulatory Visit: Payer: Medicare Other | Admitting: Pulmonary Disease

## 2021-03-11 LAB — CULTURE, BLOOD (ROUTINE X 2)
Culture: NO GROWTH
Culture: NO GROWTH
Special Requests: ADEQUATE
Special Requests: ADEQUATE

## 2021-03-15 ENCOUNTER — Ambulatory Visit: Payer: Medicare Other | Admitting: Gynecologic Oncology

## 2021-03-17 NOTE — Progress Notes (Signed)
[facility] [address], [place] [address]   CLINIC:  Medical Oncology/Hematology  CONSULT NOTE  Patient Care Team: Dettinger, Fransisca Kaufmann, MD as PCP - General (Family Medicine) Rogene Houston, MD as Consulting Physician (Gastroenterology) Derek Jack, MD as Medical Oncologist (Medical Oncology) Brien Mates, RN as Oncology Nurse Navigator (Oncology)  CHIEF COMPLAINTS/PURPOSE OF CONSULTATION:  Evaluation of pulmonary nodules  HISTORY OF PRESENTING ILLNESS:  [name] 50 y.o. female is here because of evaluation of pulmonary nodules, at the request of [facility].  Today she reports feeling well, and she is accompanied by the manager of her group home. She reports a chronic cough productive of green sputum. She was hospitalized with pneumonia in November. Her activity levels have been low since the time of her hospitalization in November. She has lost 30 pounds since April. She eats well and has a good appetite. She denies vomiting. She reports frequent BM recently, whereas previously she was frequently constipated. She reports occasionally feeling dizzy upon standing, and the manager of her group has notices she will occasional stumble and become off balance while walking. She denies hemoptysis, chest pain, fevers, ankle swellings, and night sweats.   She currently lives at a group home in [place], [place] for the past 7-8 years. Prior to living at the group home she resided at [facility]. She was adopted at the age of 30, and she is not aware of her biological family's medical history. She has no smoking history.   MEDICAL HISTORY:  Past Medical History:  Diagnosis Date   Hematuria 11/12/2014   Hepatitis B carrier (HCC)    Hypothyroidism    Impulse disorder    IUD (intrauterine device) in place 11/12/2014   Inserted 12/24/12 at [facility]   Mild intellectual disability    Schizophrenia ([place])    Schizophrenia ([place])    Tuberculosis    as a  child   Urinary frequency 11/12/2014    SURGICAL HISTORY: Past Surgical History:  Procedure Laterality Date   ARM WOUND REPAIR / CLOSURE     TRACHEAL SURGERY     TRACHEOSTOMY      SOCIAL HISTORY: Social History   Socioeconomic History   Marital status: Single    Spouse name: Not on file   Number of children: 0   Years of education: 12   Highest education level: High school graduate  Occupational History   Occupation: disabled    Comment: due to schizophrenia  Tobacco Use   Smoking status: Never   Smokeless tobacco: Never  Vaping Use   Vaping Use: Never used  Substance and Sexual Activity   Alcohol use: No    Alcohol/week: 0.0 standard drinks   Drug use: No   Sexual activity: Not Currently    Birth control/protection: I.U.D.  Other Topics Concern   Not on file  Social History Narrative   Patient was adopted. She does not have any children and has never been married. She lives in a group home and is disabled. Has a diagnosis of schizophrenia. She socially isolates herself, doesn't like to participate in group activities, sleeps a lot, has to be prompted to bathe, eat, dress, etc. -07/22/20   Social Determinants of Health   Financial Resource Strain: Low Risk    Difficulty of Paying Living Expenses: Not hard at all  Food Insecurity: No Food Insecurity   Worried About [place] in the Last Year: Never true   Ran Out of  Food in the Last Year: Never true  Transportation Needs: No Transportation Needs   Lack of Transportation (Medical): No   Lack of Transportation (Non-Medical): No  Physical Activity: Insufficiently Active   Days of Exercise per Week: 1 day   Minutes of Exercise per Session: 20 min  Stress: Stress Concern Present   Feeling of Stress : To some extent  Social Connections: Socially Isolated   Frequency of Communication with Friends and Family: More than three times a week   Frequency of Social Gatherings with Friends and Family: More than three  times a week   Attends Religious Services: Never   Marine scientist or Organizations: No   Attends Music therapist: Never   Marital Status: Never married  Human resources officer Violence: Not At Risk   Fear of Current or Ex-Partner: No   Emotionally Abused: No   Physically Abused: No   Sexually Abused: No    FAMILY HISTORY: Family History  Adopted: Yes  Family history unknown: Yes    ALLERGIES:  has No Known Allergies.  MEDICATIONS:  Current Outpatient Medications  Medication Sig Dispense Refill   acetaminophen (TYLENOL) 650 MG CR tablet Take 650 mg by mouth every 8 (eight) hours as needed for pain.     albuterol (VENTOLIN HFA) 108 (90 Base) MCG/ACT inhaler INHALE 2 PUFFS EVERY 4 HOURS AS NEEDED FOR WHEEZING. 36 g 2   Amantadine HCl 100 MG tablet Take 100 mg by mouth every morning.     Cholecalciferol (VITAMIN D3) 50 MCG (2000 UT) capsule TAKE 1 CAPSULE BY MOUTH EVERY MORNING. 30 capsule 11   cloZAPine (CLOZARIL) 100 MG tablet Take 200 mg by mouth at bedtime.     entecavir (BARACLUDE) 0.5 MG tablet TAKE (1) TABLET BY MOUTH ONCE DAILY. (Patient taking differently: Take 0.5 mg by mouth daily.) 30 tablet 11   feeding supplement, ENSURE COMPLETE, (ENSURE COMPLETE) LIQD Take 237 mLs by mouth 2 (two) times daily between meals. 60 mL 3   Fish Oil-Cholecalciferol (FISH OIL + D3) 1200-1000 MG-UNIT CAPS TAKE 1 CAPSULE BY MOUTH TWICE DAILY. 60 capsule 11   FLUoxetine (PROZAC) 20 MG capsule TAKE 3 CAPSULES BY MOUTH ONCE DAILY. 90 capsule 11   fluticasone (FLONASE) 50 MCG/ACT nasal spray SPRAY 2 SPRAYS IN RIGHT NOSTRIL ONLY TWICE DAILY. 16 g 11   guaiFENesin (MUCINEX) 600 MG 12 hr tablet Take 1 tablet (600 mg total) by mouth 2 (two) times daily. 60 tablet 2   hydrOXYzine (ATARAX) 25 MG tablet Take 25 mg by mouth every 8 (eight) hours.     hydrOXYzine (VISTARIL) 25 MG capsule TAKE 1 CAPSULE BY MOUTH EVERY 8 HOURS AS NEEDED FOR ANXIETY 90 capsule 11   levonorgestrel (MIRENA) 20  MCG/24HR IUD 1 each by Intrauterine route once.     mirtazapine (REMERON) 15 MG tablet Take 15 mg by mouth at bedtime.     polyethylene glycol powder (GLYCOLAX/MIRALAX) 17 GM/SCOOP powder Take 17 g by mouth daily. (Patient taking differently: Take 17 g by mouth daily as needed.) 507 g 11   senna-docusate (SENOKOT-S) 8.6-50 MG tablet Take 1 tablet by mouth 2 (two) times daily between meals as needed for mild constipation. 180 tablet 0   Skin Protectants, Misc. (MINERIN CREME) CREA APPLY MODERATE AMOUNT TOPICALLY TO DRY SKIN ON SOLES & HEELS OF BOTH FEET AFTER SHOWER. (NURSING STAFF TO SUPERVISE APPLICATION) (Patient taking differently: 1 application See admin instructions. Apply moderate amount topically to dry skin on soles and heels of  both feet after shower) 454 g PRN   vitamin E 200 UNIT capsule Take 1 capsule (200 Units total) by mouth daily. 30 capsule 11   No current facility-administered medications for this visit.    REVIEW OF SYSTEMS:   Review of Systems  Constitutional:  Positive for unexpected weight change (-30 lbs). Negative for appetite change and fatigue (50%).  Respiratory:  Positive for cough (chronic) and shortness of breath. Negative for hemoptysis.   Cardiovascular:  Positive for palpitations. Negative for chest pain and leg swelling.  Gastrointestinal:  Positive for diarrhea.  Musculoskeletal:  Positive for gait problem (off balance).  Neurological:  Positive for dizziness and gait problem (off balance).  Psychiatric/Behavioral:  Positive for depression. The patient is nervous/anxious.   All other systems reviewed and are negative.   PHYSICAL EXAMINATION: ECOG PERFORMANCE STATUS: 1 - Symptomatic but completely ambulatory  Vitals:   03/18/21 1343  BP: 116/69  Pulse: (!) 101  Resp: 19  Temp: 98 F (36.7 C)  SpO2: 97%   Filed Weights   03/18/21 1343  Weight: 95 lb 10.9 oz (43.4 kg)   Physical Exam Vitals reviewed.  Constitutional:      Appearance: Normal  appearance.  Cardiovascular:     Rate and Rhythm: Normal rate and regular rhythm.     Pulses: Normal pulses.     Heart sounds: Normal heart sounds.  Pulmonary:     Effort: Pulmonary effort is normal.     Breath sounds: Examination of the right-lower field reveals decreased breath sounds. Examination of the left-lower field reveals decreased breath sounds. Decreased breath sounds present.  Abdominal:     Palpations: Abdomen is soft. There is no hepatomegaly, splenomegaly or mass.     Tenderness: There is no abdominal tenderness.  Musculoskeletal:     Right lower leg: No edema.     Left lower leg: No edema.  Lymphadenopathy:     Cervical: No cervical adenopathy.     Right cervical: No superficial cervical adenopathy.    Left cervical: No superficial cervical adenopathy.     Upper Body:     Right upper body: No supraclavicular, axillary or pectoral adenopathy.     Left upper body: No supraclavicular, axillary or pectoral adenopathy.     Lower Body: No right inguinal adenopathy. No left inguinal adenopathy.  Neurological:     General: No focal deficit present.     Mental Status: She is alert and oriented to person, place, and time.  Psychiatric:        Mood and Affect: Mood normal.        Behavior: Behavior normal.     LABORATORY DATA:  I have reviewed the data as listed CBC Latest Ref Rng & Units 03/07/2021 03/06/2021 03/05/2021  WBC 4.0 - 10.5 K/uL 15.1(H) 17.3(H) 21.1(H)  Hemoglobin 12.0 - 15.0 g/dL 8.6(L) 9.2(L) 8.7(L)  Hematocrit 36.0 - 46.0 % 28.0(L) 30.3(L) 28.9(L)  Platelets 150 - 400 K/uL 335 318 281   CMP Latest Ref Rng & Units 03/07/2021 03/06/2021 03/05/2021  Glucose 70 - 99 mg/dL 84 121(H) 95  BUN 6 - 20 mg/dL 9 7 7   Creatinine 0.44 - 1.00 mg/dL 0.47 0.44 0.45  Sodium 135 - 145 mmol/L 136 138 136  Potassium 3.5 - 5.1 mmol/L 4.5 3.2(L) 3.6  Chloride 98 - 111 mmol/L 105 103 105  CO2 22 - 32 mmol/L 28 27 24   Calcium 8.9 - 10.3 mg/dL 8.8(L) 8.5(L) 7.9(L)  Total Protein  6.5 - 8.1 g/dL - - -  Total Bilirubin 0.3 - 1.2 mg/dL - - -  Alkaline Phos 38 - 126 U/L - - -  AST 15 - 41 U/L - - -  ALT 0 - 44 U/L - - -    RADIOGRAPHIC STUDIES: I have personally reviewed the radiological images as listed and agreed with the findings in the report. DG Chest 2 View  Result Date: 03/06/2021 CLINICAL DATA:  Fever EXAM: CHEST - 2 VIEW COMPARISON:  03/03/2021 FINDINGS: Persistent bilateral interstitial and alveolar opacities. There is improved aeration at the left lung base. Small right pleural effusion. Stable cardiomediastinal contours. IMPRESSION: Persistent bilateral opacities with improved aeration at the left lung base. Small right pleural effusion. Electronically Signed   By: Macy Mis M.D.   On: 03/06/2021 17:23   DG Chest 2 View  Result Date: 02/24/2021 CLINICAL DATA:  FU pleural effusion, asp pneumonia EXAM: CHEST - 2 VIEW COMPARISON:  February 13, 2021. FINDINGS: New patchy airspace opacities in the peripheral right midlung. Similar patchy opacities in the left lung. Small right pleural effusion. Emphysema. Cardiomediastinal silhouette is within normal limits and similar. Left clavicular fixation. IMPRESSION: 1. New patchy airspace opacities in the peripheral right midlung and similar opacities in the left lung, concerning for multifocal pneumonia. Recommend follow-up imaging to resolution. 2. Small right pleural effusion. 3. Emphysema. Electronically Signed   By: Margaretha Sheffield M.D.   On: 02/24/2021 12:53   CT CHEST W CONTRAST  Result Date: 03/04/2021 CLINICAL DATA:  Follow-up abnormal chest plain film findings. EXAM: CT CHEST WITH CONTRAST TECHNIQUE: Multidetector CT imaging of the chest was performed during intravenous contrast administration. CONTRAST:  82m OMNIPAQUE IOHEXOL 350 MG/ML SOLN COMPARISON:  February 06, 2023 FINDINGS: Cardiovascular: No significant vascular findings. Normal heart size with moderate to marked severity coronary artery calcification.  No pericardial effusion. Mediastinum/Nodes: Subcentimeter calcified subcarinal lymph nodes are seen. Thyroid gland, trachea, and esophagus demonstrate no significant findings. Lungs/Pleura: Moderate to marked severity areas of atelectasis and/or infiltrate are seen within the bilateral upper lobes and bilateral lower lobes. This is decreased in severity within the left upper lobe, and increased in severity within the right upper lobe and left lower lobe when compared to the prior study. Involvement of the right lower lobe is family stable. Small bilateral pleural effusions are seen, right slightly greater than left. No pneumothorax is identified. Upper Abdomen: No acute abnormality. Musculoskeletal: Multiple chronic left-sided rib fractures are seen. A metallic density fixation plate and screws are seen along the left clavicle. Mild, multilevel degenerative changes are noted throughout the thoracic spine. Other: The study is limited secondary to the presence of beam hardening artifact from a metallic density fixation plate and screws present within the mid to distal left humeral shaft. IMPRESSION: 1. Moderate to marked severity bilateral upper lobe and bilateral lower lobe atelectasis and/or infiltrate, as described above. 2. Small bilateral pleural effusions, right slightly greater than left. 3. Moderate to marked severity coronary artery calcification. Electronically Signed   By: TVirgina NorfolkM.D.   On: 03/04/2021 04:32   MR PELVIS W WO CONTRAST  Result Date: 03/08/2021 CLINICAL DATA:  Further evaluation of right ovarian cyst. EXAM: MRI PELVIS WITHOUT AND WITH CONTRAST TECHNIQUE: Multiplanar multisequence MR imaging of the pelvis was performed both before and after administration of intravenous contrast. CONTRAST:  5.527mGADAVIST GADOBUTROL 1 MMOL/ML IV SOLN COMPARISON:  CT January 20, 2021 FINDINGS: Examination is significantly degraded by patient motion and patient's inability to follow commands due to  altered mental status.  Within this context: Urinary Tract: Urinary bladder is grossly unremarkable. Bowel: Stool throughout the visualized colon likely reflects constipation. No evidence of obstruction. Vascular/Lymphatic: No pathologically enlarged lymph nodes or other significant abnormality. Reproductive: Uterus: Intrauterine device. Right ovary: The right ovary is not confidently identified in its entirety. Cystic 2.6 cm right adnexal lesion is incompletely evaluated on this study significantly limited by motion. Left ovary:  Not confidently identified. Other: Trace pelvic free fluid. Musculoskeletal:  No gross abnormality. IMPRESSION: Significantly limited study due to patient motion and patient's inability to follow commands due to altered mental status. Within this context: 1. Cystic 2.6 cm right adnexal lesion is identified without definite suspicious postcontrast enhancement but is poorly evaluated on this study significantly limited by motion. Recommend repeat pelvic MRI with and without contrast preferably as a outpatient, when patient is better able to tolerate the examination and follow commands such as breath hold. 2. Trace pelvic free fluid. 3. Stool throughout the visualized colon likely reflects constipation. Electronically Signed   By: Dahlia Bailiff M.D.   On: 03/08/2021 20:11   US PELVIS (TRANSABDOMINAL ONLY)  Result Date: 03/04/2021 CLINICAL DATA:  Adnexal mass seen on CT 01/21/2021 EXAM: TRANSABDOMINAL ULTRASOUND OF PELVIS TECHNIQUE: Transabdominal ultrasound examination of the pelvis was performed including evaluation of the uterus, ovaries, adnexal regions, and pelvic cul-de-sac. COMPARISON:  None. FINDINGS: Evaluation is markedly limited due to patient incontinence and unable to fill bladder, and inability to consent for transvaginal exam. Uterus Not identified. Endometrium Not identified. Right ovary Not identified. Left ovary Not identified. Other findings:  No definite free fluid was  seen. IMPRESSION: Limited exam due to patient incontinence and inability to fill bladder, and inability to consent for transvaginal exam. The uterus and bilateral ovaries could not be identified. Consider pelvic nonemergent MRI with and without contrast to better evaluate the right adnexal lesion seen on prior CT. Electronically Signed   By: Valetta Mole M.D.   On: 03/04/2021 14:20   DG Chest Portable 1 View  Result Date: 03/03/2021 CLINICAL DATA:  Fever, recent diagnosis pneumonia EXAM: PORTABLE CHEST 1 VIEW COMPARISON:  02/24/2021 FINDINGS: The heart size and mediastinal contours are within normal limits. Similar appearance of small bilateral pleural effusions. Unchanged heterogeneous airspace opacity throughout the lungs. Plate and screw fixation of the left clavicle. IMPRESSION: Unchanged heterogeneous airspace opacity throughout the lungs and small bilateral pleural effusions. No new airspace opacity. Electronically Signed   By: Delanna Ahmadi M.D.   On: 03/03/2021 14:15    ASSESSMENT:  Multiple lung nodules: - She was referred to our clinic for findings of multiple lung nodules based on CT scan of abdomen and pelvis from 01/20/2021. - She was recently hospitalized from 03/03/2021 through 03/09/2021 with multifocal pneumonia. - She has chronic cough as a child and has greenish preterm.  Denies any hemoptysis.  No chest pains reported. - She has about 30 pound weight loss since April 2022. - She is accompanied by the manager of the group home.  She is apparently eating 3 meals per day and snacks in between and reports good appetite.  No vomiting or other GI symptoms.  She reports decrease in energy levels.   Social/family history: - She has intellectual disability, schizophrenia.  She is residing at a group home in [place] for the last 8 years.  Prior to that she was in [facility] in [place] area. - She was adopted at age 63 from [place].  She is a non-smoker.   PLAN:  Bilateral lung nodules: - I have reviewed multiple CT scans of her lungs starting back from 2019. - Latest CT scan on 03/04/2021 showed bilateral upper lobe and lower lobe infiltrates with small bilateral pleural effusions, right slightly greater than left. - As she had recent pneumonia and recently completed antibiotics, I did not recommend any further testing at this time. - I have seen that her lung nodules are noted in 2019 CT scan of the abdomen and pelvis have improved in size on the subsequent CT scans.  I have recommended repeating a CT of the chest with contrast in 3 weeks.  We will reevaluate her at that time.  2.  Normocytic anemia: - Recent CBC on 03/07/2021 shows hemoglobin 8.6 with MCV 84.  Creatinine was normal. - Recommend anemia work-up including ferritin, J95, iron panel, folic acid, methylmalonic acid, copper, LDH and reticulocyte count.  3.  Weight loss: - She had 30 pound weight loss since April.  Part of the weight loss could be from infections. - She is eating well but continues to lose weight. - Recommend starting nutritional supplement boost 3 times daily. - Recommend dietary evaluation.   All questions were answered. The patient knows to call the clinic with any problems, questions or concerns.   Derek Jack, MD, 03/18/21 2:45 PM  [facility] [phone]   I, Thana Ates, am acting as a scribe for Dr. Derek Jack.  I, Derek Jack MD, have reviewed the above documentation for accuracy and completeness, and I agree with the above.

## 2021-03-18 ENCOUNTER — Other Ambulatory Visit: Payer: Self-pay

## 2021-03-18 ENCOUNTER — Inpatient Hospital Stay (HOSPITAL_COMMUNITY): Payer: Medicare Other | Attending: Hematology | Admitting: Hematology

## 2021-03-18 ENCOUNTER — Ambulatory Visit (HOSPITAL_COMMUNITY): Payer: Medicare Other | Admitting: Dietician

## 2021-03-18 VITALS — BP 116/69 | HR 101 | Temp 98.0°F | Resp 19 | Ht 63.0 in | Wt 95.7 lb

## 2021-03-18 DIAGNOSIS — D649 Anemia, unspecified: Secondary | ICD-10-CM | POA: Diagnosis not present

## 2021-03-18 DIAGNOSIS — J90 Pleural effusion, not elsewhere classified: Secondary | ICD-10-CM | POA: Insufficient documentation

## 2021-03-18 DIAGNOSIS — Z8701 Personal history of pneumonia (recurrent): Secondary | ICD-10-CM | POA: Diagnosis not present

## 2021-03-18 DIAGNOSIS — F209 Schizophrenia, unspecified: Secondary | ICD-10-CM | POA: Insufficient documentation

## 2021-03-18 DIAGNOSIS — D72824 Basophilia: Secondary | ICD-10-CM

## 2021-03-18 DIAGNOSIS — R918 Other nonspecific abnormal finding of lung field: Secondary | ICD-10-CM | POA: Diagnosis not present

## 2021-03-18 DIAGNOSIS — R634 Abnormal weight loss: Secondary | ICD-10-CM | POA: Diagnosis not present

## 2021-03-18 DIAGNOSIS — E43 Unspecified severe protein-calorie malnutrition: Secondary | ICD-10-CM

## 2021-03-18 DIAGNOSIS — E559 Vitamin D deficiency, unspecified: Secondary | ICD-10-CM

## 2021-03-18 MED ORDER — BOOST HIGH PROTEIN PO LIQD: 1.0000 | Freq: Three times a day (TID) | ORAL | 0 refills | Status: AC

## 2021-03-18 NOTE — Progress Notes (Signed)
Nutrition Assessment   Reason for Assessment: Provider request   ASSESSMENT: 50 year old female currently under work-up for multiple pulmonary nodules.   Past medical history includes schizophrenia/impulse disorder, intellectual disability, complex ovarian cyst, childhood TB -10/28-11/17 hospital admission for recurrent aspiration pneumonia with right empyema requiring chest tube and fibrinolytics -11/30-12/6 hospital admission for sepsis with multifocal pneumonia  Met with patient and guardian in clinic. Patient reports having a good appetite, eats 3 meals daily at group home. Patient recalls bowl of cheerios with 2% milk for breakfast. She eats sandwiches for lunch with piece of fruit, sometimes chips. Guardian recalls meat, starch and veggies for dinner. Patient will occasionally snack on dried fruit. She drinks water, sometimes they will have kool-aid or tea with meals (~1000 kcal, 40 grams protein/day). Patient denies nausea, vomiting, constipation. Guardian reports a few episodes of diarrhea recently due to antibiotics.    Nutrition Focused Physical Exam: deferred -recent NFPE on 02/10/21 by inpatient RD noted severe fat and muscle depletion to entire body. Patient diagnosed with severe malnutrition in the context of chronic illness.    Medications: Hydroxyzine, Amantadine, Remeron, Senokot-S, Prozac, D3, Fish oil, Vit E   Labs: 12/4 labs reviewed   Anthropometrics: Weights have decreased 30 lbs (24%) from usual weight in 8 months; significant  Height: 5'3" Weight: 95 lb 10.9 oz  UBW: 125 lb (07/30/20) BMI: 16.95 - underweight  Estimated Needs based on 30-35 g/kg/IBW (52.3 kg) to promote weight gain  Calories: 1570-1830 Protein: 78-89 Fluid: >1.5 L  NUTRITION DIAGNOSIS: Severe malnutrition in the context of chronic illness as evidenced by intake meeting <75% of estimated needs per dietary recall >/= 1 month, significant 24% decrease from usual weight in 8 months.     INTERVENTION:  Educated on ways to increase calories and protein (switching to whole milk, adding cheese to foods, using mayonnaise on sandwiches, cooking/adding butter)  Encouraged high calorie high protein snacks in between meals - handout with snack ideas provided Recommend pt drink 2-3 Boost Plus/equivalent daily - samples of Ensure Complete and Reason supplements given for pt to try  Contact information provided    MONITORING, EVALUATION, GOAL: Patient will tolerate increased calories and protein to promote weight gain   Next Visit: To be scheduled pending treatment plan

## 2021-03-18 NOTE — Patient Instructions (Addendum)
[place] at [facility] Discharge Instructions  You were seen and examined today by Dr. Delton Coombes. Dr. Delton Coombes is a medical oncologist, meaning that he specializes in the management of cancer diagnoses. Dr. Delton Coombes discussed your past medical history and the events that led to you being here today.  You were referred to Dr. Delton Coombes based on the results of a recent CT scan which revealed pulmonary (lung) nodules. Though this could be concerning for cancer it is also possible that it was related to your recent pneumonia infection. Dr. Delton Coombes has recommended a repeat chest CT in about a month to recheck.  Dr. Delton Coombes also recommends blood work at the time of the CT scan.  Please follow-up as scheduled.   Thank you for choosing [place] at [facility] to provide your oncology and hematology care.  To afford each patient quality time with our provider, please arrive at least 15 minutes before your scheduled appointment time.   If you have a lab appointment with the [place] please come in thru the Main Entrance and check in at the main information desk.  You need to re-schedule your appointment should you arrive 10 or more minutes late.  We strive to give you quality time with our providers, and arriving late affects you and other patients whose appointments are after yours.  Also, if you no show three or more times for appointments you may be dismissed from the clinic at the providers discretion.     Again, thank you for choosing [facility].  Our hope is that these requests will decrease the amount of time that you wait before being seen by our physicians.       _____________________________________________________________  Should you have questions after your visit to [facility], please contact our office at [phone] and follow the prompts.  Our office hours are 8:00 a.m. and 4:30 p.m. Monday -  Friday.  Please note that voicemails left after 4:00 p.m. may not be returned until the following business day.  We are closed weekends and major holidays.  You do have access to a nurse 24-7, just call the main number to the clinic [phone] and do not press any options, hold on the line and a nurse will answer the phone.    For prescription refill requests, have your pharmacy contact our office and allow 72 hours.    Due to Covid, you will need to wear a mask upon entering the hospital. If you do not have a mask, a mask will be given to you at the Main Entrance upon arrival. For doctor visits, patients may have 1 support person age 35 or older with them. For treatment visits, patients can not have anyone with them due to social distancing guidelines and our immunocompromised population.

## 2021-03-22 ENCOUNTER — Other Ambulatory Visit: Payer: Self-pay

## 2021-03-22 ENCOUNTER — Encounter: Payer: Self-pay | Admitting: Nurse Practitioner

## 2021-03-22 ENCOUNTER — Ambulatory Visit (INDEPENDENT_AMBULATORY_CARE_PROVIDER_SITE_OTHER): Payer: Medicare Other | Admitting: Nurse Practitioner

## 2021-03-22 ENCOUNTER — Ambulatory Visit (INDEPENDENT_AMBULATORY_CARE_PROVIDER_SITE_OTHER): Payer: Medicare Other

## 2021-03-22 VITALS — BP 98/54 | HR 93 | Temp 98.0°F | Ht 63.0 in | Wt 97.0 lb

## 2021-03-22 DIAGNOSIS — J189 Pneumonia, unspecified organism: Secondary | ICD-10-CM

## 2021-03-22 DIAGNOSIS — E43 Unspecified severe protein-calorie malnutrition: Secondary | ICD-10-CM

## 2021-03-22 DIAGNOSIS — Z09 Encounter for follow-up examination after completed treatment for conditions other than malignant neoplasm: Secondary | ICD-10-CM | POA: Diagnosis not present

## 2021-03-22 DIAGNOSIS — J90 Pleural effusion, not elsewhere classified: Secondary | ICD-10-CM

## 2021-03-22 LAB — BASIC METABOLIC PANEL
BUN: 16 mg/dL (ref 6–23)
CO2: 32 mEq/L (ref 19–32)
Calcium: 9.5 mg/dL (ref 8.4–10.5)
Chloride: 101 mEq/L (ref 96–112)
Creatinine, Ser: 0.73 mg/dL (ref 0.40–1.20)
GFR: 96.84 mL/min (ref >60.00)
Glucose, Bld: 81 mg/dL (ref 70–99)
Potassium: 4 mEq/L (ref 3.5–5.1)
Sodium: 140 mEq/L (ref 135–145)

## 2021-03-22 LAB — CBC WITH DIFFERENTIAL/PLATELET
Basophils Absolute: 0.2 10*3/uL — ABNORMAL HIGH (ref 0.0–0.1)
Basophils Relative: 2.2 % (ref 0.0–3.0)
Eosinophils Absolute: 0.2 10*3/uL (ref 0.0–0.7)
Eosinophils Relative: 2.5 % (ref 0.0–5.0)
HCT: 36.7 % (ref 36.0–46.0)
Hemoglobin: 11.5 g/dL — ABNORMAL LOW (ref 12.0–15.0)
Lymphocytes Relative: 31.4 % (ref 12.0–46.0)
Lymphs Abs: 2.3 10*3/uL (ref 0.7–4.0)
MCHC: 31.2 g/dL (ref 30.0–36.0)
MCV: 86.3 fl (ref 78.0–100.0)
Monocytes Absolute: 0.5 10*3/uL (ref 0.1–1.0)
Monocytes Relative: 6.8 % (ref 3.0–12.0)
Neutro Abs: 4.1 10*3/uL (ref 1.4–7.7)
Neutrophils Relative %: 57.1 % (ref 43.0–77.0)
Platelets: 366 10*3/uL (ref 150.0–400.0)
RBC: 4.26 Mil/uL (ref 3.87–5.11)
RDW: 21.7 % — ABNORMAL HIGH (ref 11.5–15.5)
WBC: 7.2 10*3/uL (ref 4.0–10.5)

## 2021-03-22 IMAGING — DX DG CHEST 2V
2 series · 2 of 2 positions shown · non-contrast
Comparison: [DATE] and CT chest [DATE].

CLINICAL DATA: Persistent multifocal pneumonia.

EXAM:
CHEST - 2 VIEW

[chest pa]
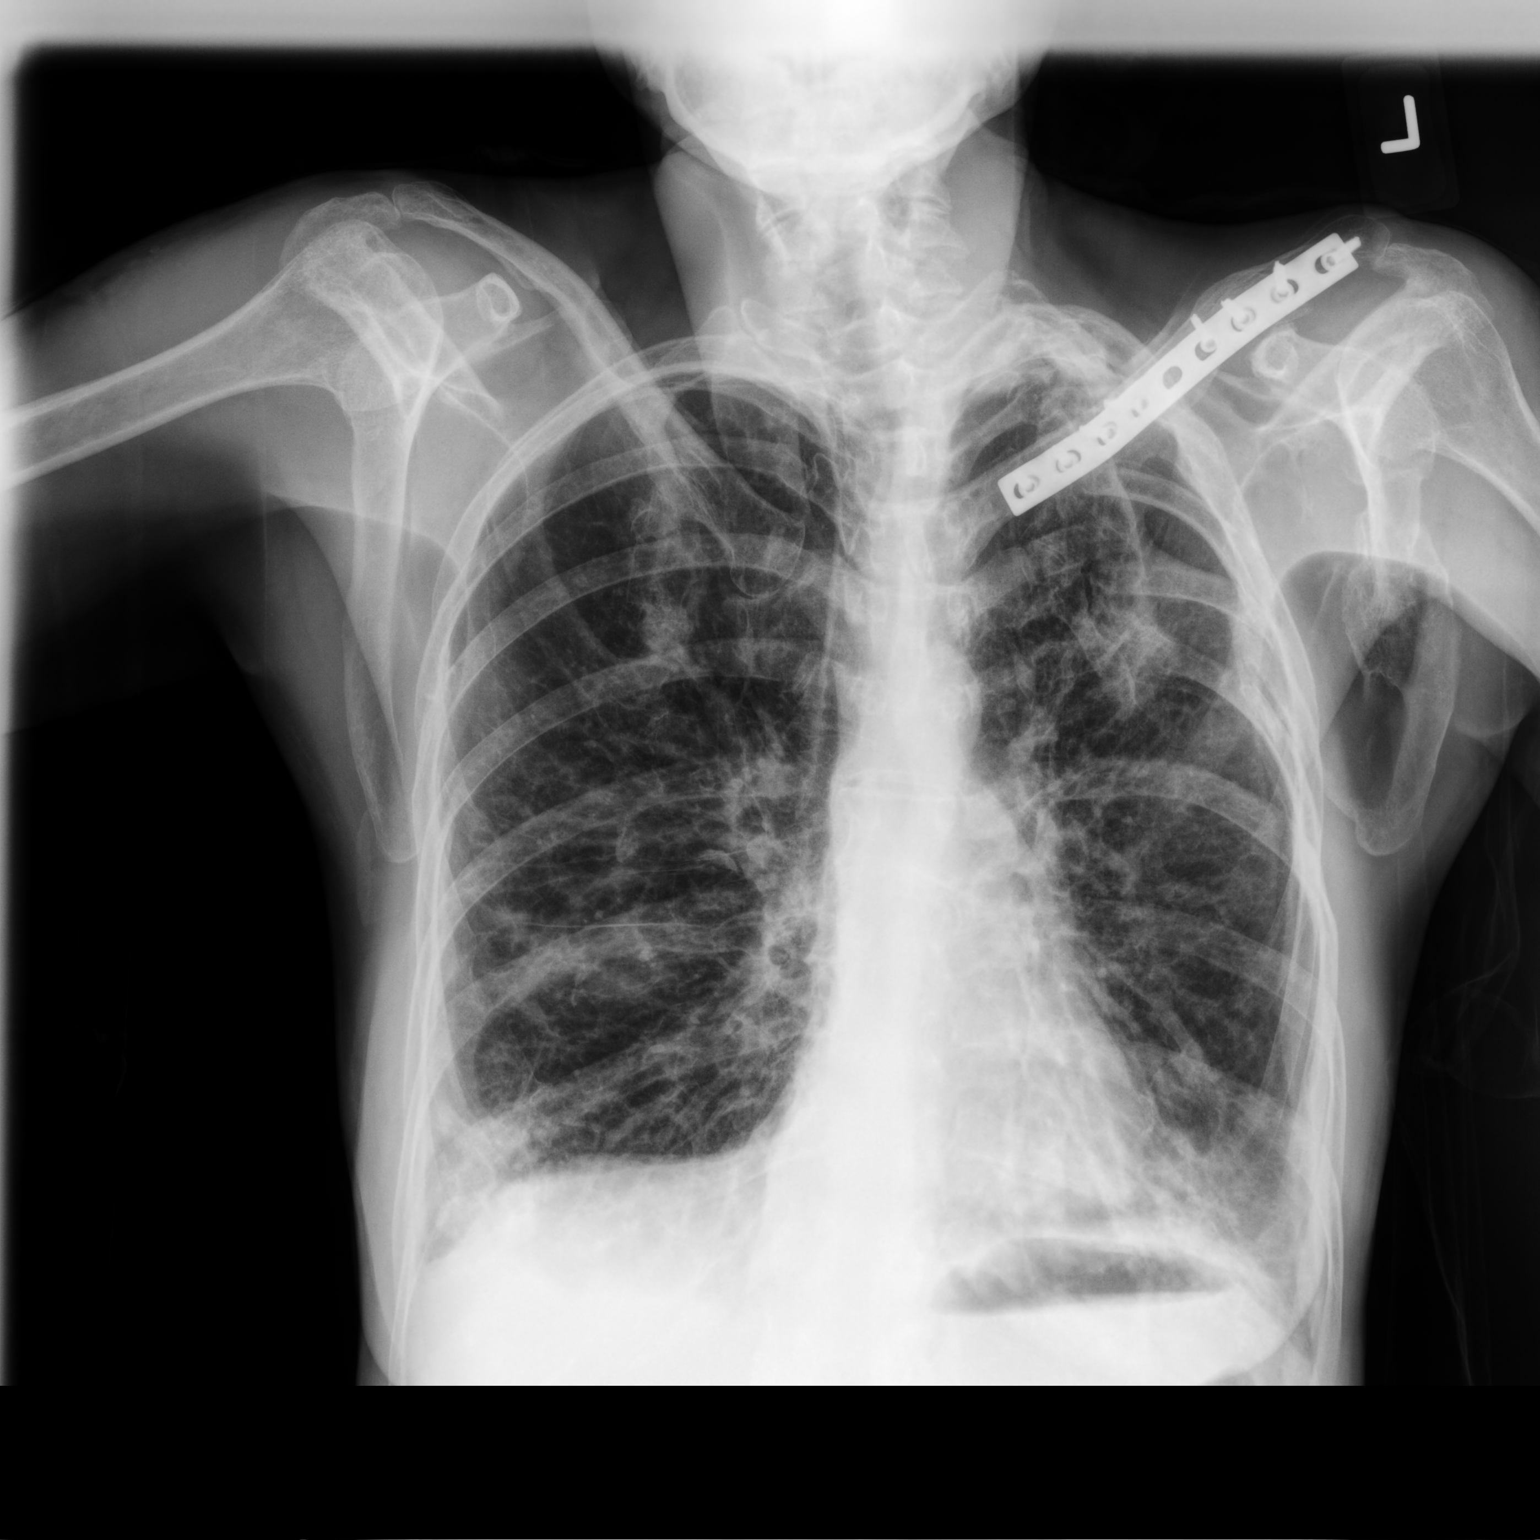

[chest lat]
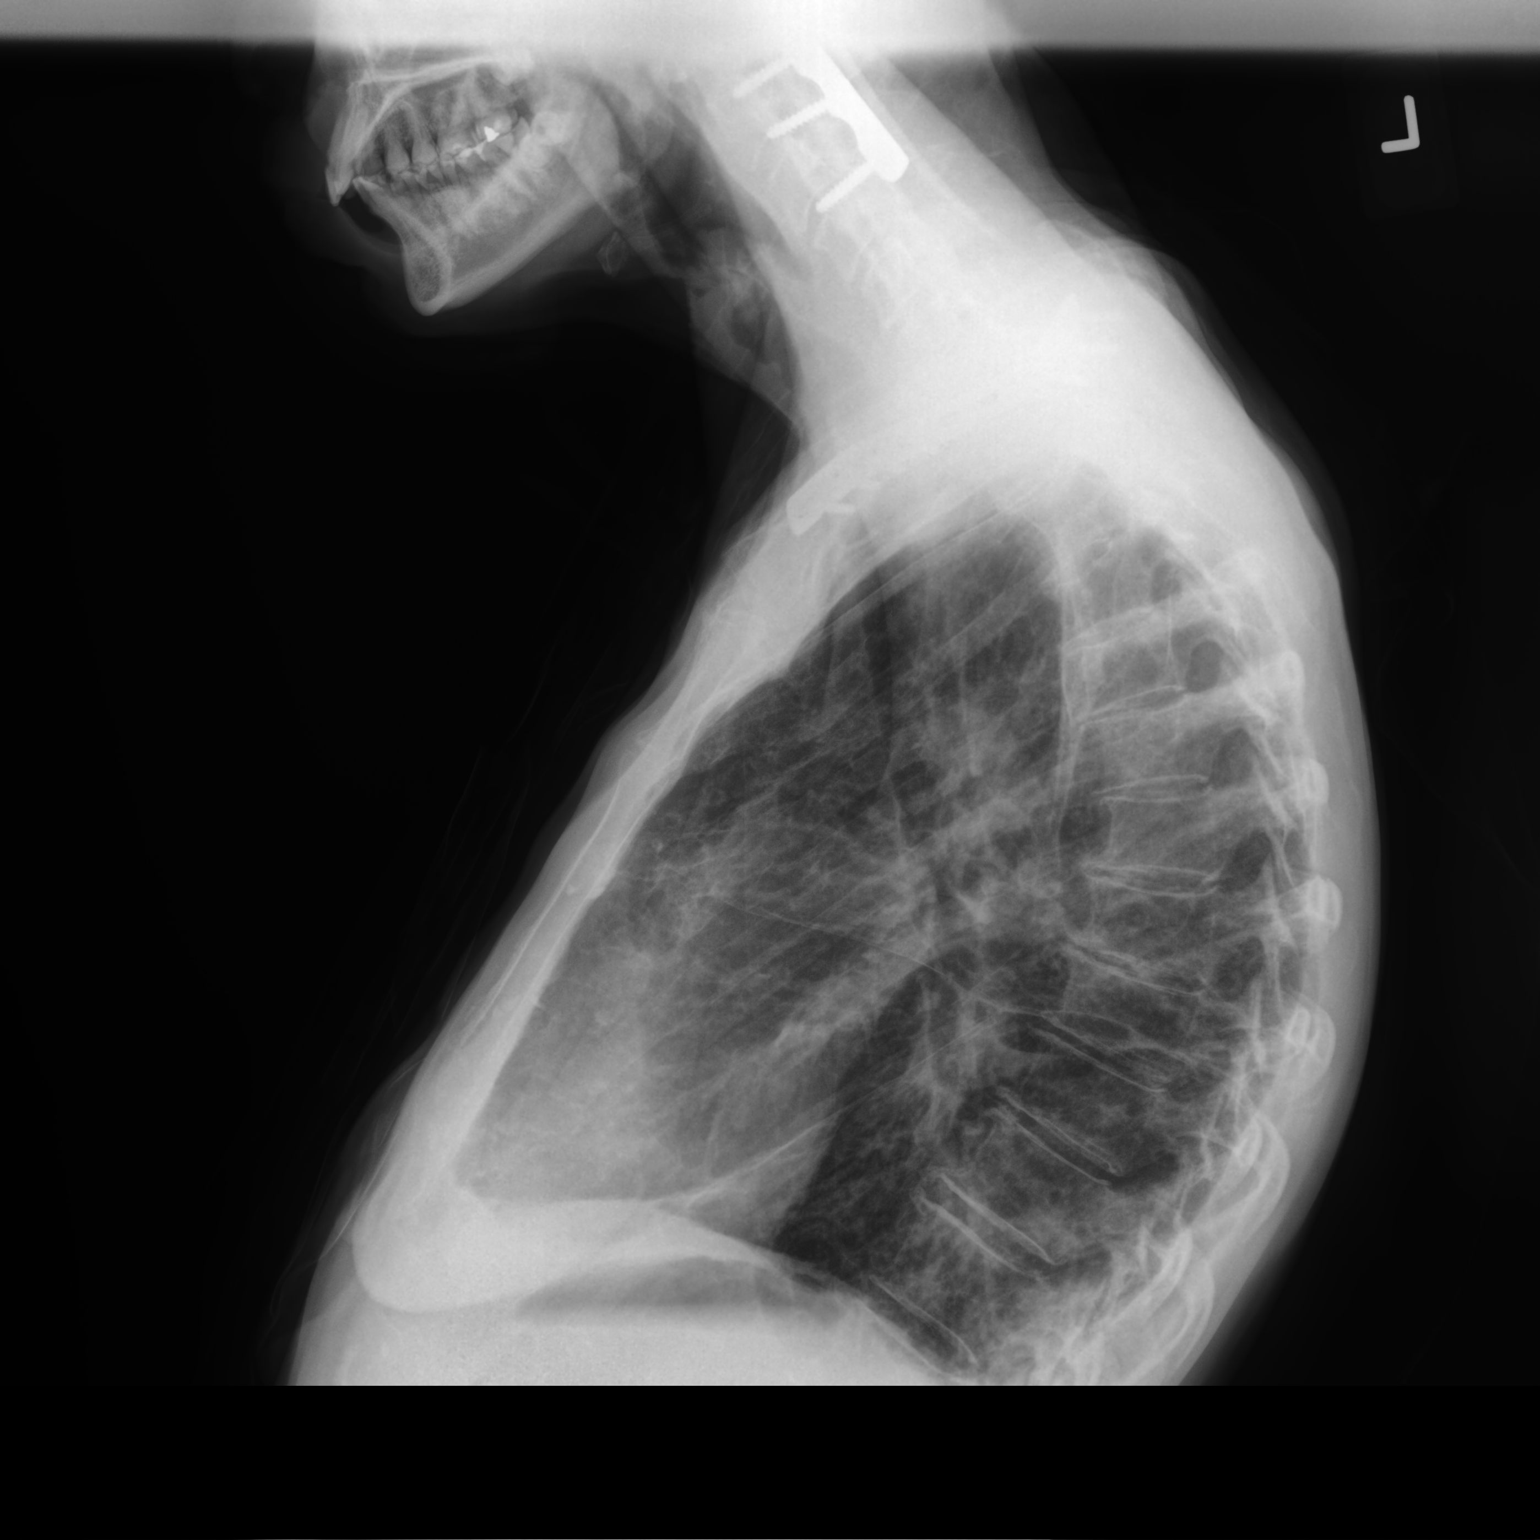

[2 of 2 positions shown; findings below may reference images not displayed]

FINDINGS: Patient is slightly rotated. Trachea is midline. Heart size normal.
Similar bibasilar airspace opacities. No definite pleural fluid. Old
left clavicle, left rib and left scapular fractures.
IMPRESSION: Similar bibasilar pneumonia.

## 2021-03-22 NOTE — Patient Instructions (Addendum)
-  Continue albuterol inhaler 2 puffs every 4 hours as needed for shortness of breath or wheezing -Continue Flonase nasal spray 2 sprays each nostril daily -Continue mucinex 600 mg Twice daily   Chest x ray today. We will notify you of results.  Labs today - will we notify you of any abnormal results.  Aspiration precautions discussed. Continue with diet as recommended by speech. Ensure supplements for protein deficiency.   Follow up with oncology regarding adrenal mass and GYN regarding ovarian cyst.   Flutter valve 10 times per hour while awake   Follow up in one month with Dr. Loanne Drilling or Dr. Halford Chessman. If symptoms do not improve or worsen, please contact office for sooner follow up or seek emergency care.

## 2021-03-22 NOTE — Assessment & Plan Note (Addendum)
Improved clinically. Previously suspected to be aspiration; Barium negative and after SLP eval, ordered regular diet. Breathing stable today with no new fevers, chills, or worsening cough. CBC with diff today to follow leukocytosis. CXR today showed similar bibasilar opacities from 12/3. Likely residual given stability and recent completion of 21 day augmentin followed by 10 day course; will monitor for now. Continue mucolytics and add flutter valve. Close follow up.   Patient Instructions  -Continue albuterol inhaler 2 puffs every 4 hours as needed for shortness of breath or wheezing -Continue Flonase nasal spray 2 sprays each nostril daily -Continue mucinex 600 mg Twice daily   Chest x ray today. We will notify you of results.  Labs today - will we notify you of any abnormal results.  Aspiration precautions discussed. Continue with diet as recommended by speech. Ensure supplements for protein deficiency.   Follow up with oncology regarding adrenal mass and GYN regarding ovarian cyst.   Flutter valve 10 times per hour while awake   Follow up in one month with Dr. Loanne Drilling or Dr. Halford Chessman. If symptoms do not improve or worsen, please contact office for sooner follow up or seek emergency care.

## 2021-03-22 NOTE — Progress Notes (Signed)
Labs unremarkable. Resolved leukocytosis (7.2 from 15.1). Anemia improving with hgb increased to 11.5 from 8.6. No change to current plan.

## 2021-03-22 NOTE — Assessment & Plan Note (Signed)
BMET for electrolyte and kidney function. See above plan.

## 2021-03-22 NOTE — Assessment & Plan Note (Signed)
Continue with Ensure supplements. See above plan.

## 2021-03-22 NOTE — Assessment & Plan Note (Addendum)
Repeat CXR today for further evaluation - no evidence of recurrence. No SOB or worsening respiratory status. See above.

## 2021-03-22 NOTE — Addendum Note (Signed)
Addended by: Clayton Bibles on: 03/22/2021 01:28 PM   Modules accepted: Orders

## 2021-03-22 NOTE — Progress Notes (Addendum)
@Patient  ID: Margaret [name], female    DOB: [birth year], 50 y.o.   MRN: [id number]  Chief Complaint  Patient presents with   Follow-up    Pt states that she is feeling good. She states that she still has problems breathing and that she still gets SOB     Referring provider: Dettinger, Fransisca Kaufmann, MD  HPI: 50 year old female, never smoker followed for centrilobular emphysema, acute respiratory failure, community acquired pneumonia, pleural effusion, multiple pulmonary nodules, history of childhood tuberculosis.  She was last seen in office by Volanda Napoleon NP on 02/24/2021.  She has had two recent hospitalizations related to multifocal pneumonia, most recently 03/03/2021 to 03/09/2021.  Past medical history significant for schizophrenia, complex ovarian cyst, intellectual disability, protein calorie malnutrition, hepatitis B, orthostatic hypotension.  TEST/EVENTS:  02/02/2021 CT chest with contrast: Atherosclerosis.  No lymphadenopathy.  Right-sided chest tube in position in the medial aspect of the mid right hemithorax.  Loculated right pleural effusion with diffuse pleural thickening and enhancement, suggestive of empyema.  Trace left pleural effusion.  Multifocal airspace consolidation and patchy groundglass attenuation noted in the lungs bilaterally with substantially worsened aeration when compared to the recent prior exam. 02/05/2021 CT chest without contrast: Interval decrease in the loculated effusion in the right mid lung fields.  Interval removal of right chest tube.  Moderate to large right pleural effusion.  Small left pleural effusion.  Interval worsening of groundglass infiltrates in the left upper lobe.  Decrease in infiltrates in the left lower lobe.  Infiltrates in the right lung have not changed.  1.3 cm nodular density in the left lower lobe with spiculated margins, may be part of resolving pneumonia or underlying parenchymal nodule such as neoplasm. 03/04/2021 CT chest with contrast:  Subcentimeter calcified subcarinal lymph nodes.  Moderate to marked severity areas of atelectasis and/or infiltrate are seen within the bilateral upper lobes and bilateral lower lobes.  Decreased in severity within the left upper lobe, increased in severity within the right upper lobe and left lower lobe when compared to prior.  Involvement of the right lower lobe is stable.  Small bilateral pleural effusions are seen.  Right slightly greater than left.  Multiple chronic left-sided rib fractures are seen. 03/06/2021 CXR 2 view: Persistent bilateral interstitial and alveolar opacities.  Improved aeration at the left lung base.  Small right pleural effusion.  01/29/2021-02/18/2021: Hospitalization related to aspiration pneumonia, right pleural effusion, empyema.  Chest tube was placed with fibrinolytics; however, required pigtail catheter placement with additional fibrinolytics due to enlarging residual posterior effusion.  21-day course of Augmentin.  Noted to have a right adrenal mass and cystic ovarian lesion with elevated CEA 125 -referral to oncology and gynecology.  SLP recommended regular diet.  02/24/2021: Ok Edwards with Volanda Napoleon NP.  Presented for hospital follow-up after prolonged hospitalization for aspiration pneumonia with right empyema requiring chest tube fibrinolytics and pigtail catheter.  Reported feeling well but still had cough with green mucus.  Decreased appetite.  SLP recommended regular diet.  She reported having 2 days left of Augmentin which was a 21-day course.  Sputum sample ordered but was never returned.  Mucinex Twice daily. Follow up with oncology for adrenal mass and GYN for ovarian cyst. CXR showed new patchy infiltrate.  Patient was advised to start Christopher on 11/29; however was then admitted on 11/30.   03/03/2021-03/09/2021: Hospitalization for sepsis related to multifocal pneumonia. COVID and flu negative. IV abx broad spectrum then transitioned to augmentin. Blood cultures negative.  No sputum culture  obtained. Discharged on 12/6 and completed Augmentin at home.   03/22/2021: Today - hospital follow up Patient presents today with caregiver for hospital follow up. She was admitted from 01/29/21-02/18/2021 for aspiration pneumonia and right empyema, and again from 03/03/2021-03/09/2021 for sepsis related to multifocal pneumonia. She reports a persistent cough with occasional green sputum, which is improving. She occasionally experiences dizziness, especially with position changes, which is unchanged from baseline. Her caregiver states that they plan to discuss this with her oncologist. She denies fevers, chills, or body aches. She denies shortness of breath, orthopnea, PND, chest pain or lower extremity swelling. She has not had to use her rescue inhaler recently. She continues on the mucinex Twice daily. Overall, she feels much better and offers no further complaints.   No Known Allergies  Immunization History  Administered Date(s) Administered   Influenza,inj,Quad PF,6+ Mos 12/30/2015, 12/30/2016, 03/14/2018, 02/20/2019   Influenza-Unspecified 01/12/2015, 01/07/2020, 01/09/2021   Moderna Covid-19 Vaccine Bivalent Booster 37yr & up 02/15/2021   Moderna Sars-Covid-2 Vaccination 06/10/2019, 07/09/2019, 02/11/2020    Past Medical History:  Diagnosis Date   Hematuria 11/12/2014   Hepatitis B carrier ([place]    Hypothyroidism    Impulse disorder    IUD (intrauterine device) in place 11/12/2014   [facility]  Mild intellectual disability    Schizophrenia ([place]    Schizophrenia ([place]    Tuberculosis    as a child   Urinary frequency 11/12/2014    Tobacco History: Social History   Tobacco Use  Smoking Status Never  Smokeless Tobacco Never   Counseling given: Not Answered   Outpatient Medications Prior to Visit  Medication Sig Dispense Refill   acetaminophen (TYLENOL) 650 MG CR tablet Take 650 mg by mouth every 8 (eight) hours as needed for pain.      albuterol (VENTOLIN HFA) 108 (90 Base) MCG/ACT inhaler INHALE 2 PUFFS EVERY 4 HOURS AS NEEDED FOR WHEEZING. 36 g 2   Amantadine HCl 100 MG tablet Take 100 mg by mouth every morning.     Cholecalciferol (VITAMIN D3) 50 MCG (2000 UT) capsule TAKE 1 CAPSULE BY MOUTH EVERY MORNING. 30 capsule 11   cloZAPine (CLOZARIL) 100 MG tablet Take 200 mg by mouth at bedtime.     entecavir (BARACLUDE) 0.5 MG tablet TAKE (1) TABLET BY MOUTH ONCE DAILY. (Patient taking differently: Take 0.5 mg by mouth daily.) 30 tablet 11   feeding supplement (BOOST HIGH PROTEIN) LIQD Take 237 mLs by mouth 3 (three) times daily between meals. 21330 mL 0   Fish Oil-Cholecalciferol (FISH OIL + D3) 1200-1000 MG-UNIT CAPS TAKE 1 CAPSULE BY MOUTH TWICE DAILY. 60 capsule 11   FLUoxetine (PROZAC) 20 MG capsule TAKE 3 CAPSULES BY MOUTH ONCE DAILY. 90 capsule 11   fluticasone (FLONASE) 50 MCG/ACT nasal spray SPRAY 2 SPRAYS IN RIGHT NOSTRIL ONLY TWICE DAILY. 16 g 11   guaiFENesin (MUCINEX) 600 MG 12 hr tablet Take 1 tablet (600 mg total) by mouth 2 (two) times daily. 60 tablet 2   hydrOXYzine (ATARAX) 25 MG tablet Take 25 mg by mouth every 8 (eight) hours.     hydrOXYzine (VISTARIL) 25 MG capsule TAKE 1 CAPSULE BY MOUTH EVERY 8 HOURS AS NEEDED FOR ANXIETY 90 capsule 11   levonorgestrel (MIRENA) 20 MCG/24HR IUD 1 each by Intrauterine route once.     mirtazapine (REMERON) 15 MG tablet Take 15 mg by mouth at bedtime.     polyethylene glycol powder (GLYCOLAX/MIRALAX) 17 GM/SCOOP powder Take 17 g by  mouth daily. (Patient taking differently: Take 17 g by mouth daily as needed.) 507 g 11   senna-docusate (SENOKOT-S) 8.6-50 MG tablet Take 1 tablet by mouth 2 (two) times daily between meals as needed for mild constipation. 180 tablet 0   Skin Protectants, Misc. (MINERIN CREME) CREA APPLY MODERATE AMOUNT TOPICALLY TO DRY SKIN ON SOLES & HEELS OF BOTH FEET AFTER SHOWER. (NURSING STAFF TO SUPERVISE APPLICATION) (Patient taking differently: 1 application  See admin instructions. Apply moderate amount topically to dry skin on soles and heels of both feet after shower) 454 g PRN   vitamin E 200 UNIT capsule Take 1 capsule (200 Units total) by mouth daily. 30 capsule 11   No facility-administered medications prior to visit.     Review of Systems:   Constitutional: No weight loss or gain, night sweats, fevers, chills, fatigue, or lassitude. HEENT: No headaches, difficulty swallowing, tooth/dental problems, or sore throat. No sneezing, itching, ear ache, nasal congestion, or post nasal drip CV:  +occasional dizziness (unchanged from baseline). No chest pain, orthopnea, PND, swelling in lower extremities, anasarca, palpitations, syncope Resp: +productive cough with occasional green sputum. No shortness of breath with exertion or at rest. No hemoptysis. No wheezing.  No chest wall deformity GI:  No heartburn, indigestion, abdominal pain, nausea, vomiting, diarrhea, change in bowel habits, loss of appetite, bloody stools.  GU: No dysuria, change in color of urine, urgency or frequency.  No flank pain, no hematuria  Skin: No rash, lesions, ulcerations MSK:  No joint pain or swelling.  No decreased range of motion.  No back pain. Neuro: +occasional dizziness with position change (unchanged from baseline; hx of orthostatic hypotension)   Psych: No depression or anxiety. Mood stable.     Physical Exam:  BP (!) 98/54 (BP Location: Left Arm, Patient Position: Sitting, Cuff Size: Normal)    Pulse 93    Temp 98 F (36.7 C) (Oral)    Ht 5' 3"  (1.6 m)    Wt 97 lb (44 kg)    SpO2 97%    BMI 17.18 kg/m   GEN: Pleasant, interactive, poorly-nourished; in no acute distress HEENT:  Normocephalic and atraumatic. EACs patent bilaterally. TM pearly gray with present light reflex bilaterally. PERRLA. Sclera white. Nasal turbinates pink, moist and patent bilaterally. No rhinorrhea present. Oropharynx pink and moist, without exudate or edema. No lesions, ulcerations,  or postnasal drip.  NECK:  Supple w/ fair ROM. No JVD present. Normal carotid impulses w/o bruits. Thyroid symmetrical with no goiter or nodules palpated. No lymphadenopathy.   CV: RRR, no m/r/g, no peripheral edema. Pulses intact, +2 bilaterally. No cyanosis, pallor or clubbing. PULMONARY:  Unlabored, regular breathing. Scattered rhonchi posterior bases; otherwise, clear bilaterally A&P w/o wheezes/rales/rhonchi. No accessory muscle use. No dullness to percussion. GI: BS present and normoactive. Soft, non-tender to palpation. No organomegaly or masses detected. No CVA tenderness. MSK: No erythema, warmth or tenderness. Cap refil <2 sec all extrem. No deformities or joint swelling noted.  Neuro: A/Ox3. No focal deficits noted.   Skin: Warm, no lesions or rashe Psych: Normal affect and behavior. Judgement and thought content appropriate.     Lab Results:  CBC    Component Value Date/Time   WBC 15.1 (H) 03/07/2021 0541   RBC 3.30 (L) 03/07/2021 0541   HGB 8.6 (L) 03/07/2021 0541   HGB 11.7 11/30/2020 1049   HCT 28.0 (L) 03/07/2021 0541   HCT 35.7 11/30/2020 1049   PLT 335 03/07/2021 0541   PLT 257  11/30/2020 1049   MCV 84.8 03/07/2021 0541   MCV 90 11/30/2020 1049   MCH 26.1 03/07/2021 0541   MCHC 30.7 03/07/2021 0541   RDW 16.8 (H) 03/07/2021 0541   RDW 11.2 (L) 11/30/2020 1049   LYMPHSABS 1.6 03/03/2021 1435   LYMPHSABS 1.5 11/30/2020 1049   MONOABS 1.0 03/03/2021 1435   EOSABS 0.0 03/03/2021 1435   EOSABS 0.1 11/30/2020 1049   BASOSABS 0.1 03/03/2021 1435   BASOSABS 0.1 11/30/2020 1049    BMET    Component Value Date/Time   NA 136 03/07/2021 0541   NA 144 11/30/2020 1049   K 4.5 03/07/2021 0541   CL 105 03/07/2021 0541   CO2 28 03/07/2021 0541   GLUCOSE 84 03/07/2021 0541   BUN 9 03/07/2021 0541   BUN 9 11/30/2020 1049   CREATININE 0.47 03/07/2021 0541   CREATININE 0.73 06/30/2020 1054   CALCIUM 8.8 (L) 03/07/2021 0541   GFRNONAA >60 03/07/2021 0541   GFRAA 102  01/08/2020 1045    BNP    Component Value Date/Time   BNP 70.2 02/01/2021 2146     Imaging:  03/22/2021: CXR today reviewed by me. Similar bibasilar airspace opacities. No definite pleural fluid.   DG Chest 2 View  Result Date: 03/22/2021 CLINICAL DATA:  Persistent multifocal pneumonia. EXAM: CHEST - 2 VIEW COMPARISON:  03/06/2021 and CT chest 03/04/2021. FINDINGS: Patient is slightly rotated. Trachea is midline. Heart size normal. Similar bibasilar airspace opacities. No definite pleural fluid. Old left clavicle, left rib and left scapular fractures. IMPRESSION: Similar bibasilar pneumonia. Electronically Signed   By: Lorin Picket M.D.   On: 03/22/2021 12:51   DG Chest 2 View  Result Date: 03/06/2021 CLINICAL DATA:  Fever EXAM: CHEST - 2 VIEW COMPARISON:  03/03/2021 FINDINGS: Persistent bilateral interstitial and alveolar opacities. There is improved aeration at the left lung base. Small right pleural effusion. Stable cardiomediastinal contours. IMPRESSION: Persistent bilateral opacities with improved aeration at the left lung base. Small right pleural effusion. Electronically Signed   By: Macy Mis M.D.   On: 03/06/2021 17:23   DG Chest 2 View  Result Date: 02/24/2021 CLINICAL DATA:  FU pleural effusion, asp pneumonia EXAM: CHEST - 2 VIEW COMPARISON:  February 13, 2021. FINDINGS: New patchy airspace opacities in the peripheral right midlung. Similar patchy opacities in the left lung. Small right pleural effusion. Emphysema. Cardiomediastinal silhouette is within normal limits and similar. Left clavicular fixation. IMPRESSION: 1. New patchy airspace opacities in the peripheral right midlung and similar opacities in the left lung, concerning for multifocal pneumonia. Recommend follow-up imaging to resolution. 2. Small right pleural effusion. 3. Emphysema. Electronically Signed   By: Margaretha Sheffield M.D.   On: 02/24/2021 12:53   CT CHEST W CONTRAST  Result Date:  03/04/2021 CLINICAL DATA:  Follow-up abnormal chest plain film findings. EXAM: CT CHEST WITH CONTRAST TECHNIQUE: Multidetector CT imaging of the chest was performed during intravenous contrast administration. CONTRAST:  51m OMNIPAQUE IOHEXOL 350 MG/ML SOLN COMPARISON:  February 06, 2023 FINDINGS: Cardiovascular: No significant vascular findings. Normal heart size with moderate to marked severity coronary artery calcification. No pericardial effusion. Mediastinum/Nodes: Subcentimeter calcified subcarinal lymph nodes are seen. Thyroid gland, trachea, and esophagus demonstrate no significant findings. Lungs/Pleura: Moderate to marked severity areas of atelectasis and/or infiltrate are seen within the bilateral upper lobes and bilateral lower lobes. This is decreased in severity within the left upper lobe, and increased in severity within the right upper lobe and left lower lobe when compared  to the prior study. Involvement of the right lower lobe is family stable. Small bilateral pleural effusions are seen, right slightly greater than left. No pneumothorax is identified. Upper Abdomen: No acute abnormality. Musculoskeletal: Multiple chronic left-sided rib fractures are seen. A metallic density fixation plate and screws are seen along the left clavicle. Mild, multilevel degenerative changes are noted throughout the thoracic spine. Other: The study is limited secondary to the presence of beam hardening artifact from a metallic density fixation plate and screws present within the mid to distal left humeral shaft. IMPRESSION: 1. Moderate to marked severity bilateral upper lobe and bilateral lower lobe atelectasis and/or infiltrate, as described above. 2. Small bilateral pleural effusions, right slightly greater than left. 3. Moderate to marked severity coronary artery calcification. Electronically Signed   By: Virgina Norfolk M.D.   On: 03/04/2021 04:32   MR PELVIS W WO CONTRAST  Result Date: 03/08/2021 CLINICAL  DATA:  Further evaluation of right ovarian cyst. EXAM: MRI PELVIS WITHOUT AND WITH CONTRAST TECHNIQUE: Multiplanar multisequence MR imaging of the pelvis was performed both before and after administration of intravenous contrast. CONTRAST:  5.27m GADAVIST GADOBUTROL 1 MMOL/ML IV SOLN COMPARISON:  CT January 20, 2021 FINDINGS: Examination is significantly degraded by patient motion and patient's inability to follow commands due to altered mental status. Within this context: Urinary Tract: Urinary bladder is grossly unremarkable. Bowel: Stool throughout the visualized colon likely reflects constipation. No evidence of obstruction. Vascular/Lymphatic: No pathologically enlarged lymph nodes or other significant abnormality. Reproductive: Uterus: Intrauterine device. Right ovary: The right ovary is not confidently identified in its entirety. Cystic 2.6 cm right adnexal lesion is incompletely evaluated on this study significantly limited by motion. Left ovary:  Not confidently identified. Other: Trace pelvic free fluid. Musculoskeletal:  No gross abnormality. IMPRESSION: Significantly limited study due to patient motion and patient's inability to follow commands due to altered mental status. Within this context: 1. Cystic 2.6 cm right adnexal lesion is identified without definite suspicious postcontrast enhancement but is poorly evaluated on this study significantly limited by motion. Recommend repeat pelvic MRI with and without contrast preferably as a outpatient, when patient is better able to tolerate the examination and follow commands such as breath hold. 2. Trace pelvic free fluid. 3. Stool throughout the visualized colon likely reflects constipation. Electronically Signed   By: JDahlia BailiffM.D.   On: 03/08/2021 20:11   [place]LVIS (TRANSABDOMINAL ONLY)  Result Date: 03/04/2021 CLINICAL DATA:  Adnexal mass seen on CT 01/21/2021 EXAM: TRANSABDOMINAL ULTRASOUND OF PELVIS TECHNIQUE: Transabdominal ultrasound  examination of the pelvis was performed including evaluation of the uterus, ovaries, adnexal regions, and pelvic cul-de-sac. COMPARISON:  None. FINDINGS: Evaluation is markedly limited due to patient incontinence and unable to fill bladder, and inability to consent for transvaginal exam. Uterus Not identified. Endometrium Not identified. Right ovary Not identified. Left ovary Not identified. Other findings:  No definite free fluid was seen. IMPRESSION: Limited exam due to patient incontinence and inability to fill bladder, and inability to consent for transvaginal exam. The uterus and bilateral ovaries could not be identified. Consider pelvic nonemergent MRI with and without contrast to better evaluate the right adnexal lesion seen on prior CT. Electronically Signed   By: PValetta MoleM.D.   On: 03/04/2021 14:20   DG Chest Portable 1 View  Result Date: 03/03/2021 CLINICAL DATA:  Fever, recent diagnosis pneumonia EXAM: PORTABLE CHEST 1 VIEW COMPARISON:  02/24/2021 FINDINGS: The heart size and mediastinal contours are within normal limits. Similar  appearance of small bilateral pleural effusions. Unchanged heterogeneous airspace opacity throughout the lungs. Plate and screw fixation of the left clavicle. IMPRESSION: Unchanged heterogeneous airspace opacity throughout the lungs and small bilateral pleural effusions. No new airspace opacity. Electronically Signed   By: Delanna Ahmadi M.D.   On: 03/03/2021 14:15      No flowsheet data found.  No results found for: NITRICOXIDE      Assessment & Plan:   Multifocal pneumonia Improved clinically. Previously suspected to be aspiration; Barium negative and after SLP eval, ordered regular diet. Breathing stable today with no new fevers, chills, or worsening cough. CBC with diff today to follow leukocytosis. CXR today showed similar bibasilar opacities from 12/3. Likely residual given stability and recent completion of 21 day augmentin followed by 10 day  course; will monitor for now. Continue mucolytics and add flutter valve. Close follow up.   Patient Instructions  -Continue albuterol inhaler 2 puffs every 4 hours as needed for shortness of breath or wheezing -Continue Flonase nasal spray 2 sprays each nostril daily -Continue mucinex 600 mg Twice daily   Chest x ray today. We will notify you of results.  Labs today - will we notify you of any abnormal results.  Aspiration precautions discussed. Continue with diet as recommended by speech. Ensure supplements for protein deficiency.   Follow up with oncology regarding adrenal mass and GYN regarding ovarian cyst.   Flutter valve 10 times per hour while awake   Follow up in one month with Dr. Loanne Drilling or Dr. Halford Chessman. If symptoms do not improve or worsen, please contact office for sooner follow up or seek emergency care.   Pleural effusion Repeat CXR today for further evaluation - no evidence of recurrence. No SOB or worsening respiratory status. See above.   Hospital discharge follow-up BMET for electrolyte and kidney function. See above plan.  Protein-calorie malnutrition, severe Continue with Ensure supplements. See above plan.     Clayton Bibles, NP 03/22/2021  Pt aware and understands NP's role.

## 2021-03-31 ENCOUNTER — Encounter: Payer: Self-pay | Admitting: Gynecologic Oncology

## 2021-03-31 ENCOUNTER — Telehealth: Payer: Self-pay | Admitting: Gynecologic Oncology

## 2021-03-31 ENCOUNTER — Inpatient Hospital Stay: Payer: Medicare Other | Attending: Gynecologic Oncology | Admitting: Gynecologic Oncology

## 2021-03-31 ENCOUNTER — Inpatient Hospital Stay: Payer: Medicare Other

## 2021-03-31 ENCOUNTER — Other Ambulatory Visit: Payer: Self-pay

## 2021-03-31 VITALS — BP 105/61 | HR 107 | Temp 98.3°F | Resp 18 | Ht 63.0 in | Wt 100.6 lb

## 2021-03-31 DIAGNOSIS — R971 Elevated cancer antigen 125 [CA 125]: Secondary | ICD-10-CM

## 2021-03-31 DIAGNOSIS — Z1151 Encounter for screening for human papillomavirus (HPV): Secondary | ICD-10-CM | POA: Diagnosis not present

## 2021-03-31 DIAGNOSIS — R42 Dizziness and giddiness: Secondary | ICD-10-CM | POA: Insufficient documentation

## 2021-03-31 DIAGNOSIS — Z79899 Other long term (current) drug therapy: Secondary | ICD-10-CM | POA: Diagnosis not present

## 2021-03-31 DIAGNOSIS — F209 Schizophrenia, unspecified: Secondary | ICD-10-CM | POA: Insufficient documentation

## 2021-03-31 DIAGNOSIS — K8020 Calculus of gallbladder without cholecystitis without obstruction: Secondary | ICD-10-CM | POA: Insufficient documentation

## 2021-03-31 DIAGNOSIS — F639 Impulse disorder, unspecified: Secondary | ICD-10-CM | POA: Diagnosis not present

## 2021-03-31 DIAGNOSIS — R918 Other nonspecific abnormal finding of lung field: Secondary | ICD-10-CM | POA: Insufficient documentation

## 2021-03-31 DIAGNOSIS — E039 Hypothyroidism, unspecified: Secondary | ICD-10-CM | POA: Diagnosis not present

## 2021-03-31 DIAGNOSIS — J439 Emphysema, unspecified: Secondary | ICD-10-CM | POA: Diagnosis not present

## 2021-03-31 DIAGNOSIS — N83201 Unspecified ovarian cyst, right side: Secondary | ICD-10-CM | POA: Diagnosis not present

## 2021-03-31 DIAGNOSIS — I2510 Atherosclerotic heart disease of native coronary artery without angina pectoris: Secondary | ICD-10-CM | POA: Diagnosis not present

## 2021-03-31 DIAGNOSIS — Z79890 Hormone replacement therapy (postmenopausal): Secondary | ICD-10-CM | POA: Insufficient documentation

## 2021-03-31 DIAGNOSIS — Z01419 Encounter for gynecological examination (general) (routine) without abnormal findings: Secondary | ICD-10-CM | POA: Diagnosis not present

## 2021-03-31 DIAGNOSIS — Z0000 Encounter for general adult medical examination without abnormal findings: Secondary | ICD-10-CM

## 2021-03-31 DIAGNOSIS — N83299 Other ovarian cyst, unspecified side: Secondary | ICD-10-CM

## 2021-03-31 DIAGNOSIS — Z975 Presence of (intrauterine) contraceptive device: Secondary | ICD-10-CM | POA: Diagnosis not present

## 2021-03-31 DIAGNOSIS — B181 Chronic viral hepatitis B without delta-agent: Secondary | ICD-10-CM | POA: Diagnosis not present

## 2021-03-31 NOTE — Patient Instructions (Addendum)
It was a pleasure meeting you today.  I will release your blood work from today in my chart.  We will have a phone visit a couple of days after your MRI.  Based on the imaging that you have had, I suspect that the small mass on or near your ovary is benign.  Once we get the repeat MRI images, we will make a plan whether any additional follow-up is needed.  Your Pap smear will be back in about a week, my office will call you with those results.  Your intrauterine device or IUD is due to be removed or removed and replaced.  I am happy to refer you to one of the gynecologist in town or you can discuss this with your primary care provider at your next visit.

## 2021-03-31 NOTE — Progress Notes (Signed)
GYNECOLOGIC ONCOLOGY NEW PATIENT CONSULTATION   Patient Name: Margaret [name]  Patient Age: 50 y.o. Date of Service: 03/03/21 Referring Provider: Dr. Wendee Beavers   Primary Care Provider: Dettinger, Fransisca Kaufmann, MD Consulting Provider: Jeral Pinch, MD   Assessment/Plan:  Likely perimenopausal patient with small cystic adnexal mass.  Discussed with the patient and visitor (who brought her from the group home) findings on multiple imaging studies that show a small, approximately 3 cm adnexal mass that appears likely cystic without concerning features. Unfortunately, during recent hospitalization, both the ultrasound and pelvic MRI were somewhat limited by patients movement. She had a CA-125 drawn during her initial hospitalization for pneumonia. This was mildly elevated, but I suspect that this was secondary to her acute infection. I have suggested we repeat this today.   I spoke with the patient about whether she would be able to be still for a repeat public MRI. She voices that she can do this. Will plan to order a repeat pelvic MRI . If this confirms overall low suspicion for malignancy given lack of complex features, I do not think that additional imaging or work-up is warranted.  The patient has a Loleta IUD in place and has been amenorrheic. In EPIC, I can see a note that she had an IUD placed in 2014. She does not remember having the IUD exchanged. I spoke to the patient that this would mean that she is overdue to have it removed and a new one replaced if she so desires. Will reach out to the patients primary care provider to see if they have any further documentation about this and to set her up for having the IUD removed or exchanged in the near future.  Patient is due for Pap smear screening in February 2023. Given proximity to this date, I offered to do Pap test today. Pap test and HPV collected. Will call the patient with the results.   A copy of this note was sent to the patient's  referring provider.   65 minutes of total time was spent for this patient encounter, including preparation, face-to-face counseling with the patient and coordination of care, and documentation of the encounter.   Jeral Pinch, MD  Division of Gynecologic Oncology  Department of Obstetrics and Gynecology  [facility]  ___________________________________________  Chief Complaint: Chief Complaint  Patient presents with   Complex ovarian cyst    History of Present Illness:  Margaret [name] is a 50 y.o. y.o. female who is seen in consultation at the request of Dr. Cyndia Skeeters for an evaluation of an adnexal mass.  Patient was recently hospitalized multiple times for pneumonia, ultimately required a chest tube and subsequently a pigtail catheter.  She then was rehospitalized with recurrent pneumonia.  Prior to her hospitalization, she had been evaluated by her gastroenterologist in October and a small firm mass had been palpated on her abdominal exam.  CT scan during her hospitalization noted a 3 cm complex mass.  Unfortunately, CA-125 was obtained during her acute infectious process and was elevated at 111.  A CEA was also obtained and normal at 1.9.  Plan had been for outpatient follow-up with GYN but the patient was referred to my clinic.  When she presented for her initial visit with me, she was febrile and tachycardic.  We recommended that she go immediately to urgent care or the emergency department.  She was scheduled to see her primary care provider for follow-up later that morning and was promptly sent to  the emergency department and readmitted.  During her second hospitalization, she underwent pelvic ultrasound and MRI of the pelvis.  Both show what appears to be a simple cystic mass without concerning features although imaging is somewhat limited due to patient's movement.  Patient presents today with someone from her group home, not her legal guardian.  She notes  that overall she is feeling much better.  Breathing is normal, still some lingering cough.  Denies any fevers or chills.  Endorses a good appetite without nausea or vomiting.  Denies any abdominal or pelvic pain/cramping.  Reports baseline normal bowel function, denies any urinary symptoms.  Has some dizziness and loses her balance sometimes if she stands up too quickly, this does not happen frequently but caretakers have to watch her closely.  PAST MEDICAL HISTORY:  Past Medical History:  Diagnosis Date   Hematuria 11/12/2014   Hepatitis B carrier ([place])    Hypothyroidism    Impulse disorder    IUD (intrauterine device) in place 11/12/2014   Inserted 12/24/12 at [facility]   Mild intellectual disability    Schizophrenia ([place])    Schizophrenia ([place])    Tuberculosis    as a child   Urinary frequency 11/12/2014     PAST SURGICAL HISTORY:  Past Surgical History:  Procedure Laterality Date   ARM WOUND REPAIR / CLOSURE     TRACHEAL SURGERY     TRACHEOSTOMY      OB/GYN HISTORY:  OB History  Gravida Para Term Preterm AB Living  0 0 0 0 0 0  SAB IAB Ectopic Multiple Live Births  0 0 0 0      No LMP recorded. (Menstrual status: IUD).  Age at menarche: 77 Age at menopause: Patient does not remember when menses stopped.  By chart history, she had an IUD put in in 2014 at [facility]. Hx of HRT: Not applicable Hx of STDs: Denies Last pap: 05/2018, no HPV testing performed History of abnormal pap smears: Patient is unsure  SCREENING STUDIES:  Last mammogram: 2022  Last colonoscopy: Has not had  MEDICATIONS: Outpatient Encounter Medications as of 03/31/2021  Medication Sig   acetaminophen (TYLENOL) 650 MG CR tablet Take 650 mg by mouth every 8 (eight) hours as needed for pain.   albuterol (VENTOLIN HFA) 108 (90 Base) MCG/ACT inhaler INHALE 2 PUFFS EVERY 4 HOURS AS NEEDED FOR WHEEZING.   Amantadine HCl 100 MG tablet Take 100 mg by mouth every morning.   Cholecalciferol (VITAMIN D3) 50 MCG (2000  UT) capsule TAKE 1 CAPSULE BY MOUTH EVERY MORNING.   cloZAPine (CLOZARIL) 100 MG tablet Take 200 mg by mouth at bedtime.   entecavir (BARACLUDE) 0.5 MG tablet TAKE (1) TABLET BY MOUTH ONCE DAILY. (Patient taking differently: Take 0.5 mg by mouth daily.)   feeding supplement (BOOST HIGH PROTEIN) LIQD Take 237 mLs by mouth 3 (three) times daily between meals.   Fish Oil-Cholecalciferol (FISH OIL + D3) 1200-1000 MG-UNIT CAPS TAKE 1 CAPSULE BY MOUTH TWICE DAILY.   FLUoxetine (PROZAC) 20 MG capsule TAKE 3 CAPSULES BY MOUTH ONCE DAILY.   fluticasone (FLONASE) 50 MCG/ACT nasal spray SPRAY 2 SPRAYS IN RIGHT NOSTRIL ONLY TWICE DAILY.   guaiFENesin (MUCINEX) 600 MG 12 hr tablet Take 1 tablet (600 mg total) by mouth 2 (two) times daily.   levonorgestrel (MIRENA) 20 MCG/24HR IUD 1 each by Intrauterine route once.   mirtazapine (REMERON) 15 MG tablet Take 15 mg by mouth at bedtime.   polyethylene glycol powder (GLYCOLAX/MIRALAX) 17 GM/SCOOP  powder Take 17 g by mouth daily. (Patient taking differently: Take 17 g by mouth daily as needed.)   senna-docusate (SENOKOT-S) 8.6-50 MG tablet Take 1 tablet by mouth 2 (two) times daily between meals as needed for mild constipation.   Skin Protectants, Misc. (MINERIN CREME) CREA APPLY MODERATE AMOUNT TOPICALLY TO DRY SKIN ON SOLES & HEELS OF BOTH FEET AFTER SHOWER. (NURSING STAFF TO SUPERVISE APPLICATION) (Patient taking differently: 1 application See admin instructions. Apply moderate amount topically to dry skin on soles and heels of both feet after shower)   vitamin E 200 UNIT capsule Take 1 capsule (200 Units total) by mouth daily.   hydrOXYzine (ATARAX) 25 MG tablet Take 25 mg by mouth every 8 (eight) hours.   hydrOXYzine (VISTARIL) 25 MG capsule TAKE 1 CAPSULE BY MOUTH EVERY 8 HOURS AS NEEDED FOR ANXIETY   No facility-administered encounter medications on file as of 03/31/2021.    ALLERGIES:  No Known Allergies   FAMILY HISTORY:  Family History  Adopted: Yes   Family history unknown: Yes     SOCIAL HISTORY:  Social Connections: Socially Isolated   Frequency of Communication with Friends and Family: More than three times a week   Frequency of Social Gatherings with Friends and Family: More than three times a week   Attends Religious Services: Never   Marine scientist or Organizations: No   Attends Music therapist: Never   Marital Status: Never married    REVIEW OF SYSTEMS:  Pertinent positives include increased appetite, occasional dizziness/loss of balance. Denies fevers, chills, fatigue, unexplained weight changes. Denies hearing loss, neck lumps or masses, mouth sores, ringing in ears or voice changes. Denies cough or wheezing.  Denies shortness of breath. Denies chest pain or palpitations. Denies leg swelling. Denies abdominal distention, pain, blood in stools, constipation, diarrhea, nausea, vomiting, or early satiety. Denies pain with intercourse, dysuria, frequency, hematuria or incontinence. Denies hot flashes, pelvic pain, vaginal bleeding or vaginal discharge.   Denies joint pain, back pain or muscle pain/cramps. Denies itching, rash, or wounds. Denies headaches, numbness or seizures. Denies swollen lymph nodes or glands, denies easy bruising or bleeding. Denies anxiety, depression, confusion, or decreased concentration.  Physical Exam:  Vital Signs for this encounter:  Blood pressure 105/61, pulse (!) 107, temperature 98.3 F (36.8 C), temperature source Tympanic, resp. rate 18, height 5' 3"  (1.6 m), weight 100 lb 9.6 oz (45.6 kg), SpO2 98 %. Body mass index is 17.82 kg/m. General: Alert, oriented, no acute distress.  HEENT: Normocephalic, atraumatic. Sclera anicteric.  Chest: Clear to auscultation bilaterally. No wheezes, rhonchi, or rales. Cardiovascular: Mildly tachycardic, regular rhythm, no murmurs, rubs, or gallops.  Abdomen: Normoactive bowel sounds. Soft, nondistended, nontender to palpation. No  masses or hepatosplenomegaly appreciated. No palpable fluid wave.  Extremities: Grossly normal range of motion. Warm, well perfused. No edema bilaterally.  Skin: No rashes or lesions.  Lymphatics: No cervical, supraclavicular, or inguinal adenopathy.  GU:  Normal external female genitalia. No lesions. No discharge or bleeding.             Bladder/urethra:  No lesions or masses, well supported bladder             Vagina: Well rugated, no lesions or masses.             Cervix: Normal appearing, no lesions.  Somewhat deviated to the left.  Mirena IUD strings seen approximately 5 cm in length.  Uterus: Small, mobile, no parametrial involvement or nodularity.             Adnexa: No masses appreciated.  Rectal: No nodularity or masses appreciated.  LABORATORY AND RADIOLOGIC DATA:  Outside medical records were reviewed to synthesize the above history, along with the history and physical obtained during the visit.   Lab Results  Component Value Date   WBC 7.2 03/22/2021   HGB 11.5 (L) 03/22/2021   HCT 36.7 03/22/2021   PLT 366.0 03/22/2021   GLUCOSE 81 03/22/2021   CHOL 133 11/30/2020   TRIG 67 11/30/2020   HDL 51 11/30/2020   LDLCALC 68 11/30/2020   ALT 16 03/03/2021   AST 17 03/03/2021   NA 140 03/22/2021   K 4.0 03/22/2021   CL 101 03/22/2021   CREATININE 0.73 03/22/2021   BUN 16 03/22/2021   CO2 32 03/22/2021   TSH 1.640 11/30/2020   INR 1.2 03/03/2021   Tumor markers on 10/30: CA-125: 111 CEA: 1.9   Pleural fluid cytology on 11/1: no malignant cells identified   CT A/P on 10/19: IMPRESSION: Multiple pulmonary nodules in both lung bases, highly suspicious for pulmonary metastases. Consider chest CT with contrast or PET-CT for further evaluation.   3.1 cm indeterminate cystic lesion in right adnexal region. Cystic ovarian neoplasm cannot be excluded. Pelvic MRI without and with contrast is recommended for further characterization.   CT chest on  10/29: IMPRESSION: Interval development of a large partially loculated right pleural effusion with near complete collapse of the right lung base in atelectasis of the right upper lobe and resultant mild mediastinal shift to the left.   Evolving infectious or inflammatory process within the aerated lungs with interval decrease in size of focal pulmonary nodules noted on prior examination, but progressive peripheral consolidation and ground-glass pulmonary infiltrate demonstrating a basilar predominance. Differential considerations are led by acute infection though pulmonary vasculitides or drug reaction could appear similarly. Diagnostic and therapeutic thoracentesis of the large right pleural effusion may be helpful for further management.   Extensive multi-vessel coronary artery calcification.   Mild emphysema   No evidence of abdominal or pelvic metastatic disease.   Marked diffuse colonic distension by stool.   Gallbladder sludge or gallstones. No radiographic evidence of cholecystitis or biliary ductal dilatation.   MRI Pelvic w and w/o contrast on 12/5: IMPRESSION: Significantly limited study due to patient motion and patient's inability to follow commands due to altered mental status. Within this context:   1. Cystic 2.6 cm right adnexal lesion is identified without definite suspicious postcontrast enhancement but is poorly evaluated on this study significantly limited by motion. Recommend repeat pelvic MRI with and without contrast preferably as a outpatient, when patient is better able to tolerate the examination and follow commands such as breath hold. 2. Trace pelvic free fluid. 3. Stool throughout the visualized colon likely reflects constipation.  Pelvic ultrasound on 12/1: IMPRESSION: Limited exam due to patient incontinence and inability to fill bladder, and inability to consent for transvaginal exam. The uterus and bilateral ovaries could not be  identified. Consider pelvic nonemergent MRI with and without contrast to better evaluate the right adnexal lesion seen on prior CT.

## 2021-03-31 NOTE — Telephone Encounter (Signed)
Received confirmation from [facility], where patient gets her primary care, that Mirena IUD was removed and replaced in February 2020.  Jeral Pinch MD Gynecologic Oncology

## 2021-04-01 ENCOUNTER — Telehealth: Payer: Self-pay

## 2021-04-01 LAB — CA 125: Cancer Antigen (CA) 125: 75.2 U/mL — ABNORMAL HIGH (ref 0.0–38.1)

## 2021-04-01 NOTE — Telephone Encounter (Signed)
Spoke with patients caregiver [place] Wilson this afternoon. Reviewed CA 125 lab results. Per Joylene John, NP CA 125 is decreasing and is now 75.2 (down from 111). We will still plan to proceed with MRI scheduled for 04/10/21 and to have a phone visit with Dr. Berline Lopes on 04/12/21. She verbalized understanding, instructed to call with any questions or concerns.

## 2021-04-10 ENCOUNTER — Ambulatory Visit (HOSPITAL_COMMUNITY)
Admission: RE | Admit: 2021-04-10 | Discharge: 2021-04-10 | Disposition: A | Discharge status: Home / Self Care | Payer: Medicare Other | Source: Ambulatory Visit | Attending: Gynecologic Oncology | Admitting: Gynecologic Oncology

## 2021-04-10 DIAGNOSIS — N83299 Other ovarian cyst, unspecified side: Secondary | ICD-10-CM

## 2021-04-10 DIAGNOSIS — K6389 Other specified diseases of intestine: Secondary | ICD-10-CM | POA: Diagnosis not present

## 2021-04-10 DIAGNOSIS — R971 Elevated cancer antigen 125 [CA 125]: Secondary | ICD-10-CM | POA: Diagnosis not present

## 2021-04-10 LAB — CYTOLOGY - PAP
Comment: NEGATIVE
Diagnosis: NEGATIVE
High risk HPV: NEGATIVE

## 2021-04-10 IMAGING — MR MR PELVIS WO/W CM
18 of 19 series · 47 of 48 positions shown · IV contrast (5ml GADAVIST)
Comparison: Previous MRI pelvis [DATE] and ultrasound
[DATE]

CLINICAL DATA: Evaluate right adnexal cyst seen on prior CT scan.

EXAM:
MRI PELVIS WITHOUT AND WITH CONTRAST
TECHNIQUE: Multiplanar multisequence MR imaging of the pelvis was performed
both before and after administration of intravenous contrast.
CONTRAST:  5mL GADAVIST GADOBUTROL 1 MMOL/ML IV SOLN

[Series 2: T2 · coronal · 6.0mm · 1.56mm/px · 1 of 24 slices shown (1 of 4)]
[im 1/24]
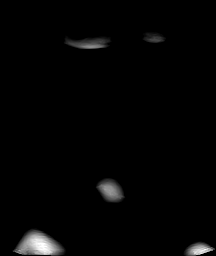

[Series 3: T2 · axial · 5.0mm · 0.51mm/px · z∈[-88,+110]mm · 2 of 34 slices shown (2 of 4)]
[im 1/34]
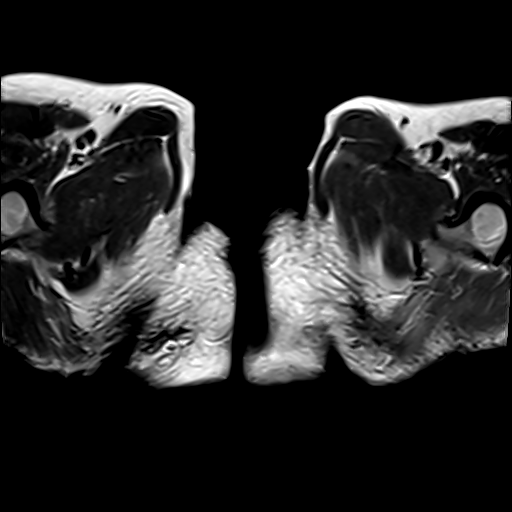
[im 34/34]
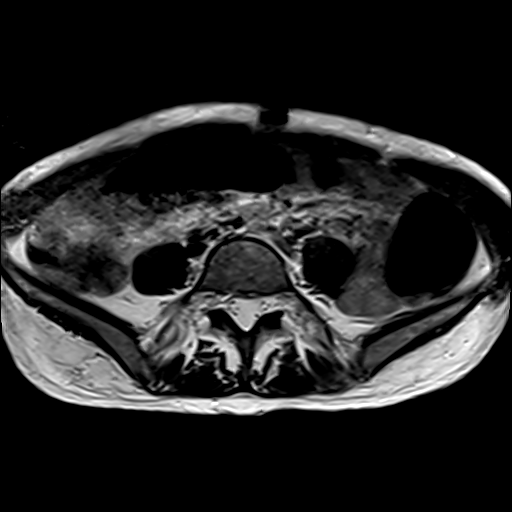

[Series 4: T2 · coronal · 4.0mm · 0.51mm/px · 2 of 34 slices shown (3 of 4)]
[im 1/34]
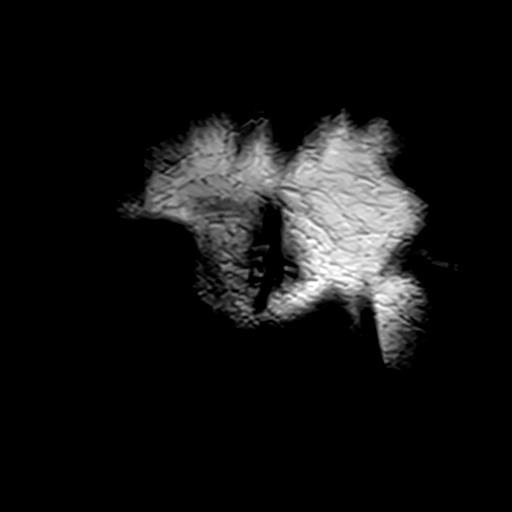
[im 34/34]
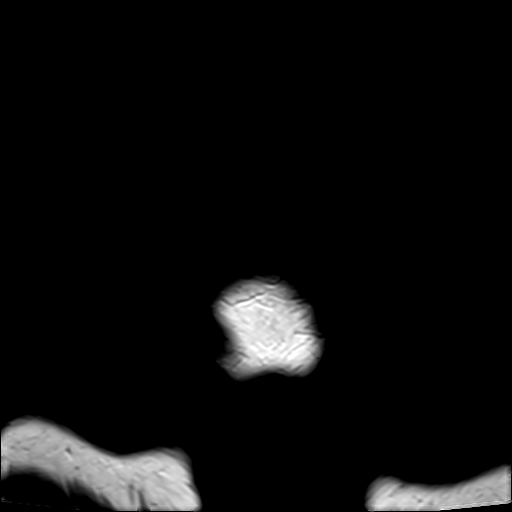

[Series 5: T2 fat-sat · axial · 5.0mm · 0.51mm/px · z∈[-88,+110]mm · 2 of 34 slices shown]
[im 1/34]
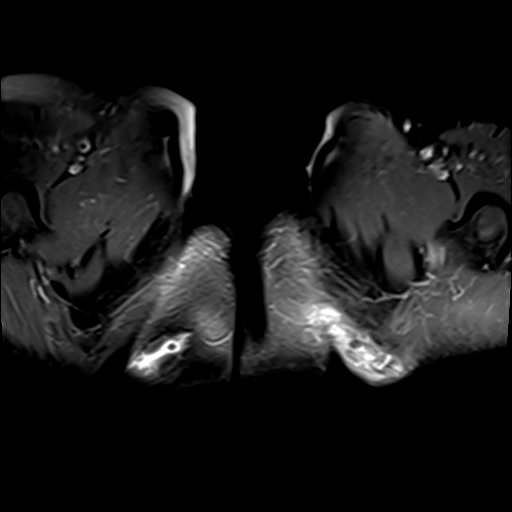
[im 34/34]
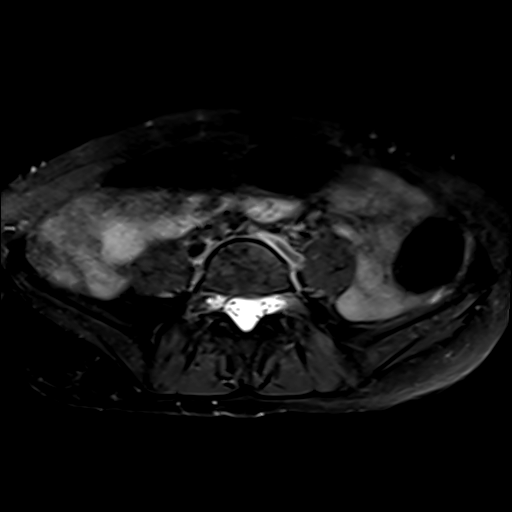

[Series 6: T2 · sagittal · 5.0mm · 0.55mm/px · 1 of 28 slices shown (4 of 4)]
[im 1/28]
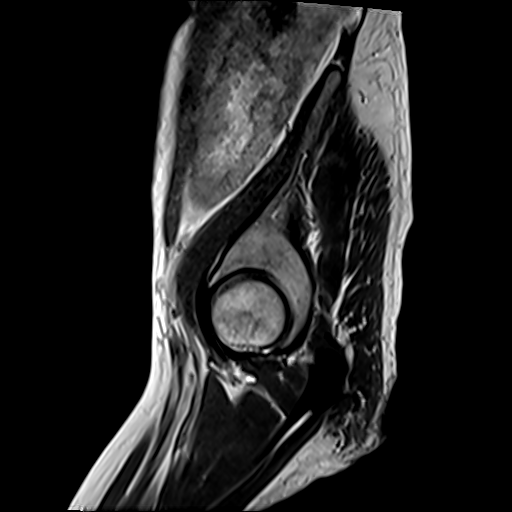

[Series 7: T1 · axial · 4.0mm · 0.84mm/px · z∈[-96,+108]mm · 3 of 52 slices shown (1 of 2)]
[im 1/52]
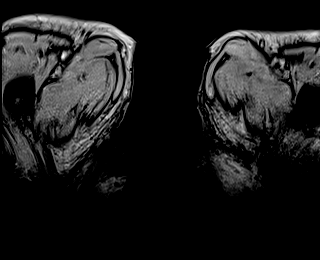
[im 26/52]
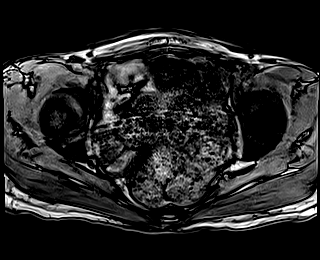
[im 52/52]
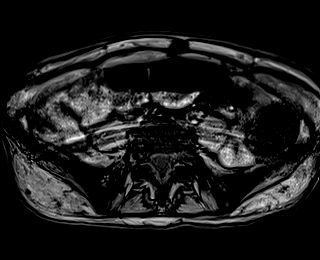

[Series 8: T1 · axial · 4.0mm · 0.84mm/px · z∈[-96,+108]mm · 3 of 52 slices shown (2 of 2)]
[im 1/52]
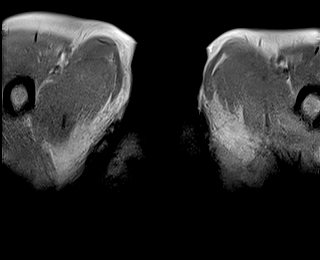
[im 26/52]
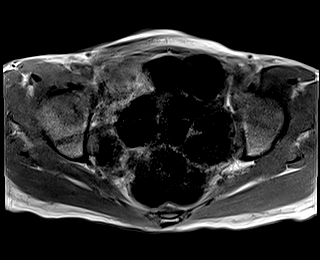
[im 52/52]
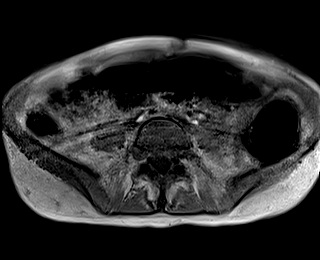

[Series 9: DWI · axial · 5.0mm · 2.80mm/px · z∈[-77,+78]mm · 4 of 87 slices shown (1 of 3)]
[im 1/87]
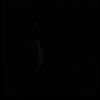
[im 29/87]
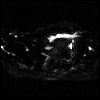
[im 58/87]
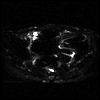
[im 87/87]
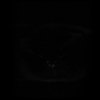

[Series 10: DWI · axial · 5.0mm · 2.80mm/px · z∈[-77,+78]mm · 2 of 32 slices shown (2 of 3)]
[im 1/32]
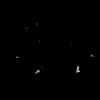
[im 32/32]
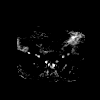

[Series 11: DWI · axial · 5.0mm · 2.80mm/px · z∈[-77,+78]mm · 2 of 32 slices shown (3 of 3)]
[im 1/32]
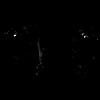
[im 32/32]
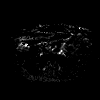

[Series 13: T1 dynamic · axial · 3.0mm · 0.91mm/px · z∈[-85,+104]mm · 3 of 64 slices shown (1 of 6)]
[im 1/64]
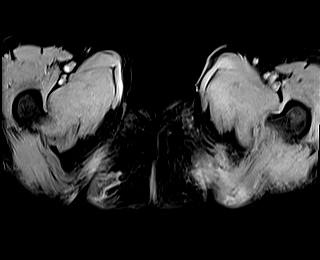
[im 32/64]
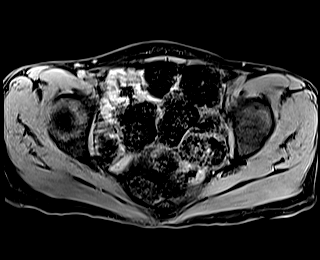
[im 64/64]
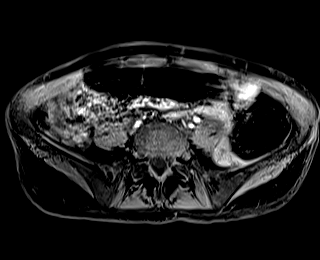

[Series 15: T1 dynamic · axial · 3.0mm · 0.91mm/px · z∈[-90,+99]mm · 3 of 64 slices shown (2 of 6)]
[im 1/64]
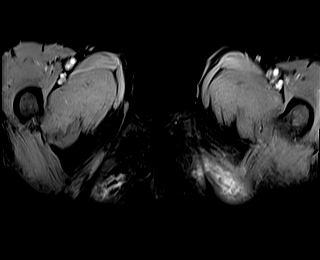
[im 32/64]
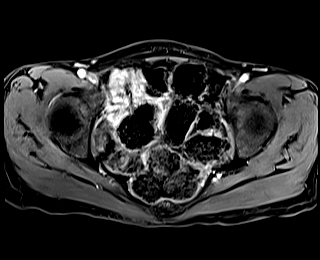
[im 64/64]
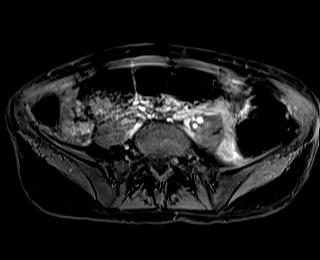

[Series 18: T1 dynamic · axial · 3.0mm · 0.91mm/px · z∈[-85,+104]mm · 3 of 64 slices shown (3 of 6)]
[im 1/64]
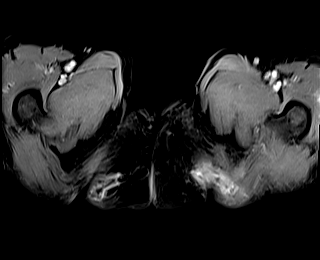
[im 32/64]
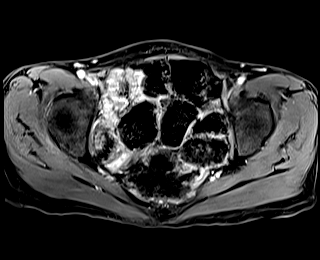
[im 64/64]
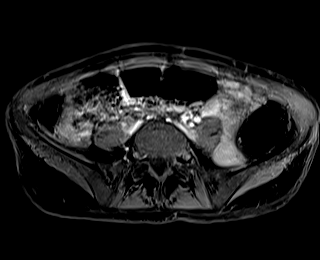

[Series 19: T1 dynamic · axial · 3.0mm · 0.91mm/px · z∈[-85,+104]mm · 3 of 64 slices shown (4 of 6)]
[im 1/64]
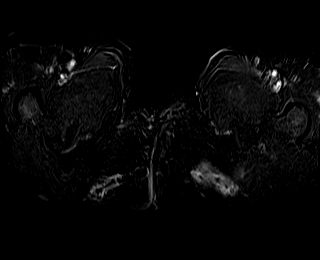
[im 32/64]
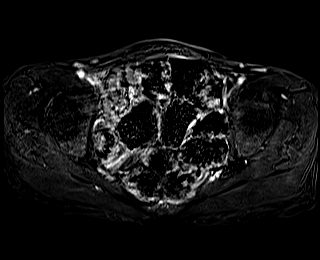
[im 64/64]
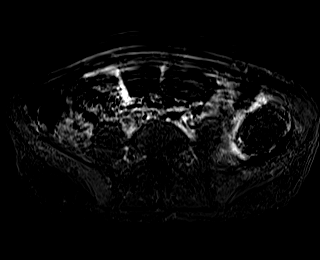

[Series 22: T1 dynamic · axial · 3.0mm · 0.91mm/px · z∈[-85,+104]mm · 3 of 64 slices shown (5 of 6)]
[im 1/64]
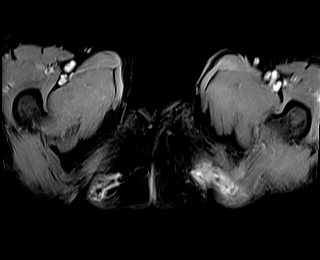
[im 32/64]
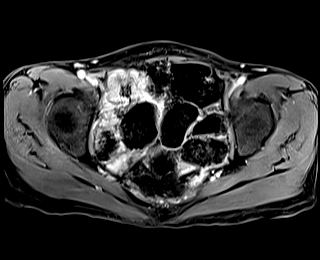
[im 64/64]
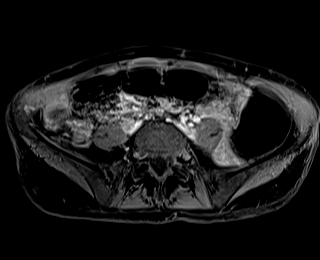

[Series 23: T1 dynamic · axial · 3.0mm · 0.91mm/px · z∈[-85,+104]mm · 3 of 64 slices shown (6 of 6)]
[im 1/64]
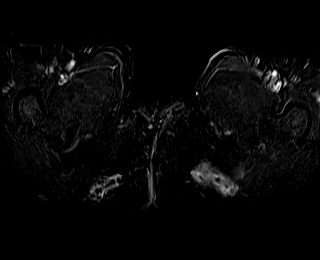
[im 32/64]
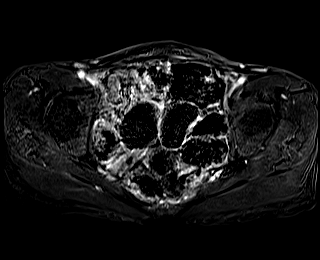
[im 64/64]
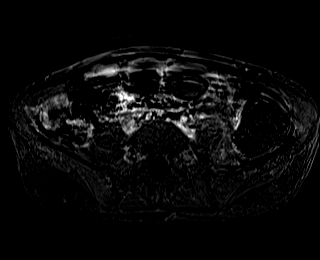

[Series 25: T1 dynamic post-contrast · axial · 3.0mm · 1.06mm/px · z∈[-96,+117]mm · 4 of 72 slices shown (1 of 2)]
[im 1/72]
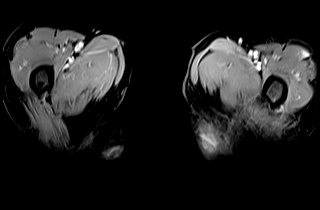
[im 24/72]
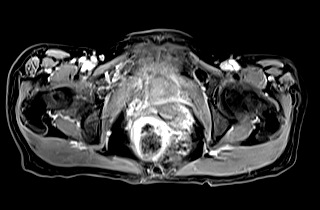
[im 48/72]
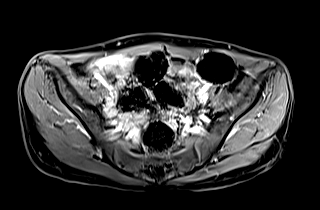
[im 72/72]
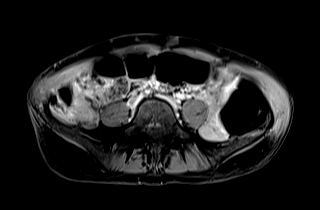

[Series 27: T1 dynamic post-contrast · sagittal · 3.0mm · 0.88mm/px · 3 of 64 slices shown (2 of 2)]
[im 1/64]
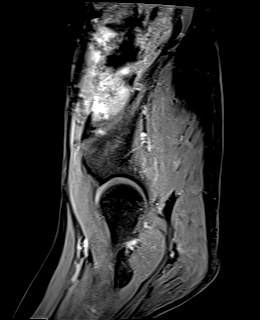
[im 32/64]
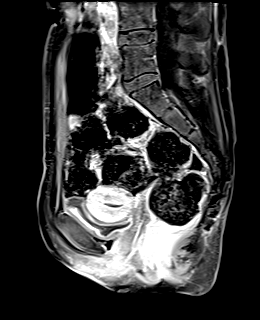
[im 64/64]
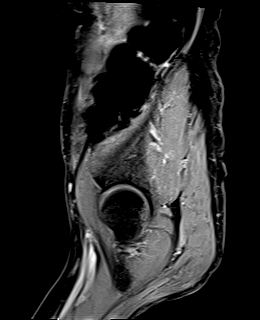

[47 of 48 positions shown; findings below may reference images not displayed]

FINDINGS: Urinary Tract: The bladder is largely decompressed/empty but no
obvious abnormalities.

Bowel: No rectal mass or inflammatory process. Significant colonic
distention with gas and stool suggesting constipation.

Vascular/Lymphatic: No significant findings.

Reproductive: An IUD is noted in the uterus. No myometrial or
endometrial abnormalities otherwise. The ovaries are unremarkable.
No ovarian mass or cyst is identified.

Other: No pelvic mass or free pelvic fluid collections. No inguinal
mass or hernia.

Musculoskeletal: No significant bony findings.
IMPRESSION: 1. No ovarian mass or cyst is identified.
2. Significant colonic distention with gas and stool suggesting
constipation.
3. IUD noted in the uterus.

## 2021-04-10 MED ORDER — GADOBUTROL 1 MMOL/ML IV SOLN
5.0000 mL | Freq: Once | INTRAVENOUS | Status: AC | PRN
  Administered 2021-04-10: 5 mL via INTRAVENOUS

## 2021-04-12 ENCOUNTER — Inpatient Hospital Stay: Payer: Medicare Other | Attending: Gynecologic Oncology | Admitting: Gynecologic Oncology

## 2021-04-12 ENCOUNTER — Encounter: Payer: Self-pay | Admitting: Gynecologic Oncology

## 2021-04-12 DIAGNOSIS — R971 Elevated cancer antigen 125 [CA 125]: Secondary | ICD-10-CM

## 2021-04-12 DIAGNOSIS — N83299 Other ovarian cyst, unspecified side: Secondary | ICD-10-CM | POA: Diagnosis not present

## 2021-04-12 NOTE — Progress Notes (Signed)
Gynecologic Oncology Telehealth Consult Note: Gyn-Onc  I connected with Margaret [name] on 04/12/21 at  4:20 PM EST by telephone and verified that I am speaking with the correct person using two identifiers.  I discussed the limitations, risks, security and privacy concerns of performing an evaluation and management service by telemedicine and the availability of in-person appointments. I also discussed with the patient that there may be a patient responsible charge related to this service. The patient expressed understanding and agreed to proceed.  Other persons participating in the visit and their role in the encounter:  [employer] - Nile Riggs - supervisor/caregiver at group home  Patient's location: Group home, [place] Provider's location:  [facility]  Reason for Visit: follow-up imaging  Treatment History: Oncology History   No history exists.    Interval History: Denies changes to how she is feeling since recent visit.  Past Medical/Surgical History: Past Medical History:  Diagnosis Date   Hematuria 11/12/2014   Hepatitis B carrier (HCC)    Hypothyroidism    Impulse disorder    IUD (intrauterine device) in place 11/12/2014   Inserted 12/24/12 at [facility]   Mild intellectual disability    Schizophrenia ([place])    Schizophrenia ([place])    Tuberculosis    as a child   Urinary frequency 11/12/2014    Past Surgical History:  Procedure Laterality Date   ARM WOUND REPAIR / CLOSURE     TRACHEAL SURGERY     TRACHEOSTOMY      Family History  Adopted: Yes  Family history unknown: Yes    Social History   Socioeconomic History   Marital status: Single    Spouse name: Not on file   Number of children: 0   Years of education: 12   Highest education level: High school graduate  Occupational History   Occupation: disabled    Comment: due to schizophrenia  Tobacco Use   Smoking status: Never   Smokeless tobacco: Never  Vaping Use   Vaping Use: Never used   Substance and Sexual Activity   Alcohol use: No    Alcohol/week: 0.0 standard drinks   Drug use: No   Sexual activity: Not Currently    Birth control/protection: I.U.D.  Other Topics Concern   Not on file  Social History Narrative   Patient was adopted. She does not have any children and has never been married. She lives in a group home and is disabled. Has a diagnosis of schizophrenia. She socially isolates herself, doesn't like to participate in group activities, sleeps a lot, has to be prompted to bathe, eat, dress, etc. -07/22/20   Social Determinants of Health   Financial Resource Strain: Low Risk    Difficulty of Paying Living Expenses: Not hard at all  Food Insecurity: No Food Insecurity   Worried About Charity fundraiser in the Last Year: Never true   [place] in the Last Year: Never true  Transportation Needs: No Transportation Needs   Lack of Transportation (Medical): No   Lack of Transportation (Non-Medical): No  Physical Activity: Insufficiently Active   Days of Exercise per Week: 1 day   Minutes of Exercise per Session: 20 min  Stress: Stress Concern Present   Feeling of Stress : To some extent  Social Connections: Socially Isolated   Frequency of Communication with Friends and Family: More than three times a week   Frequency of Social Gatherings with Friends and Family: More than three times a week  Attends Religious Services: Never   Active Member of Clubs or Organizations: No   Attends Archivist Meetings: Never   Marital Status: Never married    Current Medications:  Current Outpatient Medications:    acetaminophen (TYLENOL) 650 MG CR tablet, Take 650 mg by mouth every 8 (eight) hours as needed for pain., Disp: , Rfl:    albuterol (VENTOLIN HFA) 108 (90 Base) MCG/ACT inhaler, INHALE 2 PUFFS EVERY 4 HOURS AS NEEDED FOR WHEEZING., Disp: 36 g, Rfl: 2   Amantadine HCl 100 MG tablet, Take 100 mg by mouth every morning., Disp: , Rfl:     Cholecalciferol (VITAMIN D3) 50 MCG (2000 UT) capsule, TAKE 1 CAPSULE BY MOUTH EVERY MORNING., Disp: 30 capsule, Rfl: 11   cloZAPine (CLOZARIL) 100 MG tablet, Take 200 mg by mouth at bedtime., Disp: , Rfl:    entecavir (BARACLUDE) 0.5 MG tablet, TAKE (1) TABLET BY MOUTH ONCE DAILY. (Patient taking differently: Take 0.5 mg by mouth daily.), Disp: 30 tablet, Rfl: 11   feeding supplement (BOOST HIGH PROTEIN) LIQD, Take 237 mLs by mouth 3 (three) times daily between meals., Disp: 21330 mL, Rfl: 0   Fish Oil-Cholecalciferol (FISH OIL + D3) 1200-1000 MG-UNIT CAPS, TAKE 1 CAPSULE BY MOUTH TWICE DAILY., Disp: 60 capsule, Rfl: 11   FLUoxetine (PROZAC) 20 MG capsule, TAKE 3 CAPSULES BY MOUTH ONCE DAILY., Disp: 90 capsule, Rfl: 11   fluticasone (FLONASE) 50 MCG/ACT nasal spray, SPRAY 2 SPRAYS IN RIGHT NOSTRIL ONLY TWICE DAILY., Disp: 16 g, Rfl: 11   guaiFENesin (MUCINEX) 600 MG 12 hr tablet, Take 1 tablet (600 mg total) by mouth 2 (two) times daily., Disp: 60 tablet, Rfl: 2   hydrOXYzine (ATARAX) 25 MG tablet, Take 25 mg by mouth every 8 (eight) hours., Disp: , Rfl:    hydrOXYzine (VISTARIL) 25 MG capsule, TAKE 1 CAPSULE BY MOUTH EVERY 8 HOURS AS NEEDED FOR ANXIETY, Disp: 90 capsule, Rfl: 11   levonorgestrel (MIRENA) 20 MCG/24HR IUD, 1 each by Intrauterine route once., Disp: , Rfl:    mirtazapine (REMERON) 15 MG tablet, Take 15 mg by mouth at bedtime., Disp: , Rfl:    polyethylene glycol powder (GLYCOLAX/MIRALAX) 17 GM/SCOOP powder, Take 17 g by mouth daily. (Patient taking differently: Take 17 g by mouth daily as needed.), Disp: 507 g, Rfl: 11   senna-docusate (SENOKOT-S) 8.6-50 MG tablet, Take 1 tablet by mouth 2 (two) times daily between meals as needed for mild constipation., Disp: 180 tablet, Rfl: 0   Skin Protectants, Misc. (MINERIN CREME) CREA, APPLY MODERATE AMOUNT TOPICALLY TO DRY SKIN ON SOLES & HEELS OF BOTH FEET AFTER SHOWER. (NURSING STAFF TO SUPERVISE APPLICATION) (Patient taking differently: 1  application See admin instructions. Apply moderate amount topically to dry skin on soles and heels of both feet after shower), Disp: 454 g, Rfl: PRN   vitamin E 200 UNIT capsule, Take 1 capsule (200 Units total) by mouth daily., Disp: 30 capsule, Rfl: 11   Physical Exam: There were no vitals taken for this visit. Deferred given limitations of phone visit  Laboratory & Radiologic Studies: Component Ref Range & Units 12 d ago 2 mo ago  Cancer Antigen (CA) 125 0.0 - 38.1 U/mL 75.2 High   111.0 High    12/28 - Pap negative, HR HPV negative  04/10/21 MRI pelvis: IMPRESSION: 1. No ovarian mass or cyst is identified. 2. Significant colonic distention with gas and stool suggesting constipation. 3. IUD noted in the uterus.  Assessment & Plan: Margaret [name]  is a 51 y.o. woman with multiple recent admissions for pneumonia incidentally found to have an adnexal mass and mildly elevated CA-125.  Discussed repeat MRI findings which show resolution of adnexal mass. CA-125 has also decreased (was likely elevated in the setting of acute infection/inflammation).   I recommend regular follow-up with PCP. Reviewed that my office confirmed IUD was replaced in 2020.  I discussed the assessment and treatment plan with the patient. The patient was provided with an opportunity to ask questions and all were answered. The patient agreed with the plan and demonstrated an understanding of the instructions.   The patient was advised to call back or see an in-person evaluation if the symptoms worsen or if the condition fails to improve as anticipated.   14 minutes of total time was spent for this patient encounter, including preparation, face-to-face counseling with the patient and coordination of care, and documentation of the encounter.   Jeral Pinch, MD  Division of Gynecologic Oncology  Department of Obstetrics and Gynecology  [facility]

## 2021-04-14 ENCOUNTER — Other Ambulatory Visit (HOSPITAL_BASED_OUTPATIENT_CLINIC_OR_DEPARTMENT_OTHER): Payer: Medicare Other

## 2021-04-22 ENCOUNTER — Other Ambulatory Visit: Payer: Self-pay

## 2021-04-22 ENCOUNTER — Ambulatory Visit (INDEPENDENT_AMBULATORY_CARE_PROVIDER_SITE_OTHER): Payer: Medicare Other | Admitting: Pulmonary Disease

## 2021-04-22 ENCOUNTER — Encounter: Payer: Self-pay | Admitting: Pulmonary Disease

## 2021-04-22 VITALS — BP 108/66 | HR 97 | Temp 98.4°F | Ht 65.0 in | Wt 103.0 lb

## 2021-04-22 DIAGNOSIS — J869 Pyothorax without fistula: Secondary | ICD-10-CM | POA: Diagnosis not present

## 2021-04-22 DIAGNOSIS — L03113 Cellulitis of right upper limb: Secondary | ICD-10-CM

## 2021-04-22 MED ORDER — "STERILE GAUZE 3""X3"" PADS": 2.0000 | MEDICATED_PAD | Freq: Every day | 1 refills | Status: AC

## 2021-04-22 MED ORDER — "TEGADERM FILM 6""X8"" MISC": 1.0000 | Freq: Every day | 1 refills | Status: DC

## 2021-04-22 MED ORDER — CEPHALEXIN 500 MG PO CAPS: 500.0000 mg | ORAL_CAPSULE | Freq: Four times a day (QID) | ORAL | 0 refills | Status: AC

## 2021-04-22 NOTE — Progress Notes (Signed)
@Patient  ID: Margaret [name], female    DOB: 22-Apr-1970, 51 y.o.   MRN: [id number]  Chief Complaint  Patient presents with   Follow-up    Pneumonia     Referring provider: Dettinger, Fransisca Kaufmann, MD  HPI: 51 year old female never smoker with emphysema, hx childhood TB, schizophrenia, complex ovarian cyst, intellectual disability, protein calorie malnutrition, hepatitis B, orthostatic hypotension. who presents for follow-up. Her caregiver is present.  She has had two recent hospitalizations related to multifocal pneumonia in setting of aspiration. First hospitalization complicated by empyema requiring chest tube in early November followed by readmission for pneumonia. Barium negative and was placed on regular diet.  Since our last visit she reports feeling overall well. Denies fevers chills. No cough, wheezing or shortness of breath. However she is concerned about the site on right thorax where her prior chest tube was in early November that is tender to touch. She has not kept it dressed or protected.   Review of Systems  Constitutional:  Negative for chills, diaphoresis, fever, malaise/fatigue and weight loss.  HENT:  Negative for congestion.   Respiratory:  Negative for cough, hemoptysis, sputum production, shortness of breath and wheezing.   Cardiovascular:  Positive for chest pain. Negative for palpitations and leg swelling.   No Known Allergies  Immunization History  Administered Date(s) Administered   Influenza,inj,Quad PF,6+ Mos 12/30/2015, 12/30/2016, 03/14/2018, 02/20/2019   Influenza-Unspecified 01/12/2015, 01/07/2020, 01/09/2021   Moderna Covid-19 Vaccine Bivalent Booster 35yr & up 02/15/2021   Moderna Sars-Covid-2 Vaccination 06/10/2019, 07/09/2019, 02/11/2020    Past Medical History:  Diagnosis Date   Hematuria 11/12/2014   Hepatitis B carrier ([place]    Hypothyroidism    Impulse disorder    IUD (intrauterine device) in place 11/12/2014   [facility]   Mild intellectual disability    Schizophrenia ([place]    Schizophrenia ([place]    Tuberculosis    as a child   Urinary frequency 11/12/2014    Tobacco History: Social History   Tobacco Use  Smoking Status Never  Smokeless Tobacco Never   Counseling given: Not Answered   Outpatient Medications Prior to Visit  Medication Sig Dispense Refill   acetaminophen (TYLENOL) 650 MG CR tablet Take 650 mg by mouth every 8 (eight) hours as needed for pain.     albuterol (VENTOLIN HFA) 108 (90 Base) MCG/ACT inhaler INHALE 2 PUFFS EVERY 4 HOURS AS NEEDED FOR WHEEZING. 36 g 2   Amantadine HCl 100 MG tablet Take 100 mg by mouth every morning.     Cholecalciferol (VITAMIN D3) 50 MCG (2000 UT) capsule TAKE 1 CAPSULE BY MOUTH EVERY MORNING. 30 capsule 11   cloZAPine (CLOZARIL) 100 MG tablet Take 200 mg by mouth at bedtime.     entecavir (BARACLUDE) 0.5 MG tablet TAKE (1) TABLET BY MOUTH ONCE DAILY. (Patient taking differently: Take 0.5 mg by mouth daily.) 30 tablet 11   Fish Oil-Cholecalciferol (FISH OIL + D3) 1200-1000 MG-UNIT CAPS TAKE 1 CAPSULE BY MOUTH TWICE DAILY. 60 capsule 11   FLUoxetine (PROZAC) 20 MG capsule TAKE 3 CAPSULES BY MOUTH ONCE DAILY. 90 capsule 11   fluticasone (FLONASE) 50 MCG/ACT nasal spray SPRAY 2 SPRAYS IN RIGHT NOSTRIL ONLY TWICE DAILY. 16 g 11   guaiFENesin (MUCINEX) 600 MG 12 hr tablet Take 1 tablet (600 mg total) by mouth 2 (two) times daily. 60 tablet 2   hydrOXYzine (ATARAX) 25 MG tablet Take 25 mg by mouth every 8 (eight) hours.  hydrOXYzine (VISTARIL) 25 MG capsule TAKE 1 CAPSULE BY MOUTH EVERY 8 HOURS AS NEEDED FOR ANXIETY 90 capsule 11   levonorgestrel (MIRENA) 20 MCG/24HR IUD 1 each by Intrauterine route once.     mirtazapine (REMERON) 15 MG tablet Take 15 mg by mouth at bedtime.     polyethylene glycol powder (GLYCOLAX/MIRALAX) 17 GM/SCOOP powder Take 17 g by mouth daily. (Patient taking differently: Take 17 g by mouth daily as needed.) 507 g 11   senna-docusate  (SENOKOT-S) 8.6-50 MG tablet Take 1 tablet by mouth 2 (two) times daily between meals as needed for mild constipation. 180 tablet 0   Skin Protectants, Misc. (MINERIN CREME) CREA APPLY MODERATE AMOUNT TOPICALLY TO DRY SKIN ON SOLES & HEELS OF BOTH FEET AFTER SHOWER. (NURSING STAFF TO SUPERVISE APPLICATION) (Patient taking differently: 1 application See admin instructions. Apply moderate amount topically to dry skin on soles and heels of both feet after shower) 454 g PRN   vitamin E 200 UNIT capsule Take 1 capsule (200 Units total) by mouth daily. 30 capsule 11   No facility-administered medications prior to visit.      Physical Exam:  BP 108/66 (BP Location: Left Arm, Cuff Size: Small)    Pulse 97    Temp 98.4 F (36.9 C) (Oral)    Ht 5' 5"  (1.651 m)    Wt 46.7 kg    SpO2 97%    BMI 17.14 kg/m  Physical Exam: General: Well-appearing, no acute distress HENT: [place], AT Eyes: EOMI, no scleral icterus Respiratory: Diminished breath bilaterally with scattered rhonchi.  No wheezing  Cardiovascular: RRR, -M/R/G, no JVD Extremities:-Edema,-tenderness Neuro: AAO x4, CNII-XII grossly intact Psych: Normal mood, normal affect Skin: Prior right chest tube site with hypergranulation tissue with mild erythema and tenderness to palpation, indurated areas likely scar tissue with no pus expressed  Lab Results:  CBC    Component Value Date/Time   WBC 7.2 03/22/2021 1242   RBC 4.26 03/22/2021 1242   HGB 11.5 (L) 03/22/2021 1242   HGB 11.7 11/30/2020 1049   HCT 36.7 03/22/2021 1242   HCT 35.7 11/30/2020 1049   PLT 366.0 03/22/2021 1242   PLT 257 11/30/2020 1049   MCV 86.3 03/22/2021 1242   MCV 90 11/30/2020 1049   MCH 26.1 03/07/2021 0541   MCHC 31.2 03/22/2021 1242   RDW 21.7 (H) 03/22/2021 1242   RDW 11.2 (L) 11/30/2020 1049   LYMPHSABS 2.3 03/22/2021 1242   LYMPHSABS 1.5 11/30/2020 1049   MONOABS 0.5 03/22/2021 1242   EOSABS 0.2 03/22/2021 1242   EOSABS 0.1 11/30/2020 1049   BASOSABS 0.2  (H) 03/22/2021 1242   BASOSABS 0.1 11/30/2020 1049    BMET    Component Value Date/Time   NA 140 03/22/2021 1242   NA 144 11/30/2020 1049   K 4.0 03/22/2021 1242   CL 101 03/22/2021 1242   CO2 32 03/22/2021 1242   GLUCOSE 81 03/22/2021 1242   BUN 16 03/22/2021 1242   BUN 9 11/30/2020 1049   CREATININE 0.73 03/22/2021 1242   CREATININE 0.73 06/30/2020 1054   CALCIUM 9.5 03/22/2021 1242   GFRNONAA >60 03/07/2021 0541   GFRAA 102 01/08/2020 1045    BNP    Component Value Date/Time   BNP 70.2 02/01/2021 2146     Imaging: CT Chest 03/04/21 - Bilateral atelectasis and infiltrate, small bilateral pleural effusions CXR 03/22/22 - Bibasilar opacities, unchanged  MR Pelvis W Wo Contrast  Result Date: 04/11/2021 CLINICAL DATA:  Evaluate right adnexal  cyst seen on prior CT scan. EXAM: MRI PELVIS WITHOUT AND WITH CONTRAST TECHNIQUE: Multiplanar multisequence MR imaging of the pelvis was performed both before and after administration of intravenous contrast. CONTRAST:  69m GADAVIST GADOBUTROL 1 MMOL/ML IV SOLN COMPARISON:  Previous MRI pelvis 03/08/2021 and ultrasound 03/04/2021 FINDINGS: Urinary Tract: The bladder is largely decompressed/empty but no obvious abnormalities. Bowel: No rectal mass or inflammatory process. Significant colonic distention with gas and stool suggesting constipation. Vascular/Lymphatic: No significant findings. Reproductive: An IUD is noted in the uterus. No myometrial or endometrial abnormalities otherwise. The ovaries are unremarkable. No ovarian mass or cyst is identified. Other: No pelvic mass or free pelvic fluid collections. No inguinal mass or hernia. Musculoskeletal: No significant bony findings. IMPRESSION: 1. No ovarian mass or cyst is identified. 2. Significant colonic distention with gas and stool suggesting constipation. 3. IUD noted in the uterus. Electronically Signed   By: PMarijo SanesM.D.   On: 04/11/2021 13:29      No flowsheet data found.  No  results found for: NITRICOXIDE      Assessment & Plan:   Right thorax cellulitis History of empyema --START keflex 500 mg every 6 hours for five days --Change to dry dressing daily  --ORDER CT Chest with contrast. Order before appointment  Kanai Berrios JRodman Pickle MD 04/22/2021  Pt aware and understands NP's role.

## 2021-04-22 NOTE — Patient Instructions (Addendum)
Right thorax cellulitis History of empyema --START keflex 500 mg every 6 hours for five days --Change to dry dressing daily  --ORDER CT Chest with contrast. Order before appointment  Follow-up with me on 04/30/21 after CT scan

## 2021-04-23 ENCOUNTER — Inpatient Hospital Stay (HOSPITAL_COMMUNITY): Payer: Medicare Other | Attending: Hematology

## 2021-04-23 ENCOUNTER — Ambulatory Visit (HOSPITAL_COMMUNITY)
Admission: RE | Admit: 2021-04-23 | Discharge: 2021-04-23 | Disposition: A | Discharge status: Home / Self Care | Payer: Medicare Other | Source: Ambulatory Visit | Attending: Hematology | Admitting: Hematology

## 2021-04-23 ENCOUNTER — Encounter (HOSPITAL_COMMUNITY): Payer: Self-pay

## 2021-04-23 DIAGNOSIS — E559 Vitamin D deficiency, unspecified: Secondary | ICD-10-CM | POA: Diagnosis not present

## 2021-04-23 DIAGNOSIS — R058 Other specified cough: Secondary | ICD-10-CM | POA: Insufficient documentation

## 2021-04-23 DIAGNOSIS — D649 Anemia, unspecified: Secondary | ICD-10-CM | POA: Insufficient documentation

## 2021-04-23 DIAGNOSIS — D72824 Basophilia: Secondary | ICD-10-CM | POA: Diagnosis not present

## 2021-04-23 DIAGNOSIS — R918 Other nonspecific abnormal finding of lung field: Secondary | ICD-10-CM | POA: Diagnosis not present

## 2021-04-23 DIAGNOSIS — J90 Pleural effusion, not elsewhere classified: Secondary | ICD-10-CM | POA: Insufficient documentation

## 2021-04-23 DIAGNOSIS — E43 Unspecified severe protein-calorie malnutrition: Secondary | ICD-10-CM | POA: Diagnosis not present

## 2021-04-23 DIAGNOSIS — F209 Schizophrenia, unspecified: Secondary | ICD-10-CM | POA: Insufficient documentation

## 2021-04-23 DIAGNOSIS — R634 Abnormal weight loss: Secondary | ICD-10-CM | POA: Insufficient documentation

## 2021-04-23 DIAGNOSIS — I700 Atherosclerosis of aorta: Secondary | ICD-10-CM | POA: Diagnosis not present

## 2021-04-23 IMAGING — CT CT CHEST W/ CM
2 of 3 series · 15 of 36 positions shown, 18 images · IV contrast (agent unspecified)
Comparison: [DATE]

CLINICAL DATA: Recent pneumonia and empyema, persistent cough

EXAM:
CT CHEST WITH CONTRAST
TECHNIQUE: Multidetector CT imaging of the chest was performed during
intravenous contrast administration.

[Series 2: routine chest with · axial · 0.71mm/px · z∈[+869,+1137]mm · 12 of 158 slices shown, 15 images]
[im 12/158  mediastinal]
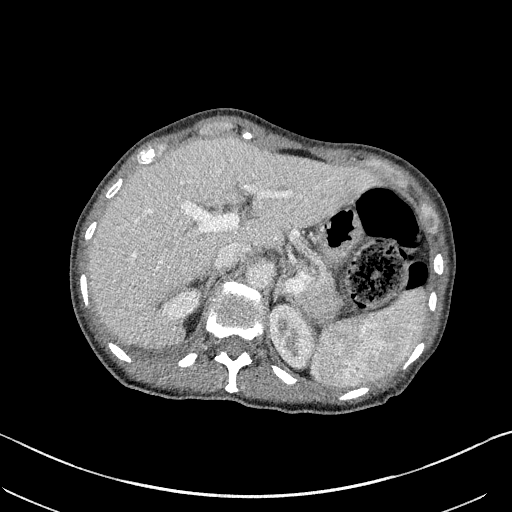
[im 12/158  lung]
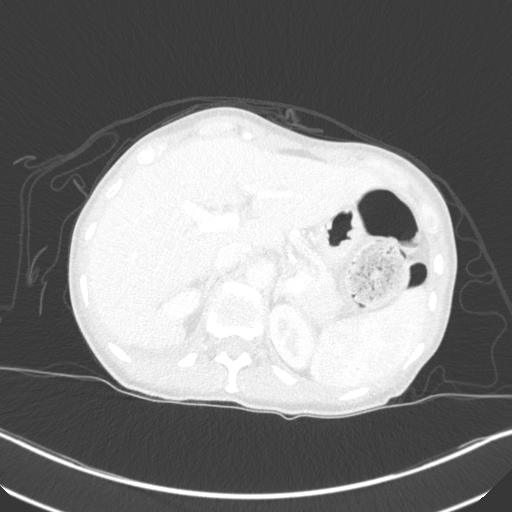
[im 24/158  lung]
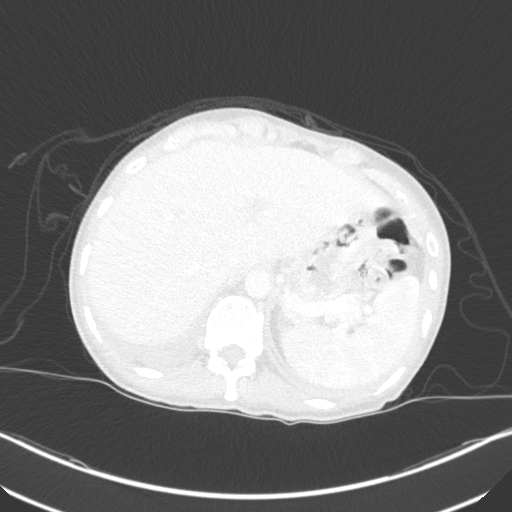
[im 35/158  lung]
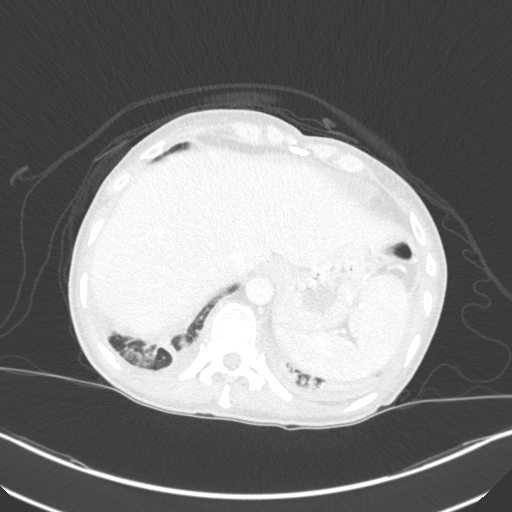
[im 47/158  lung]
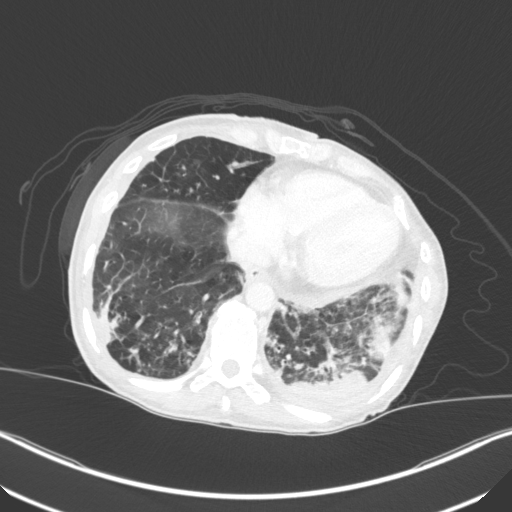
[im 59/158  mediastinal]
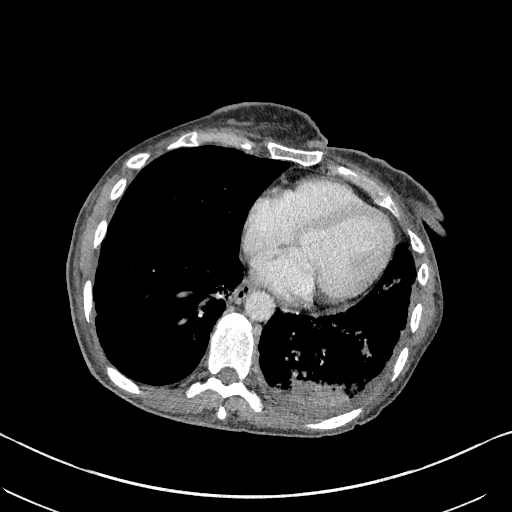
[im 59/158  lung]
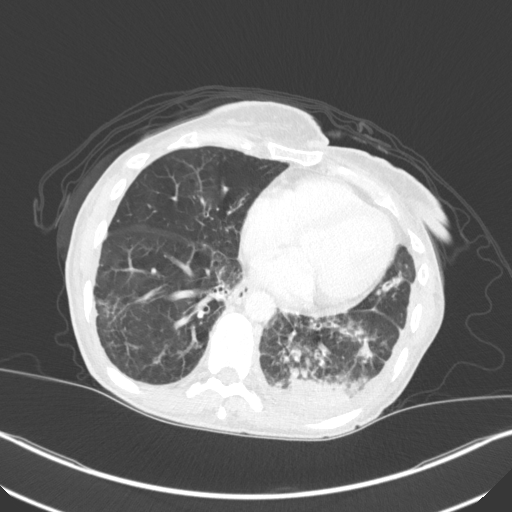
[im 70/158  lung]
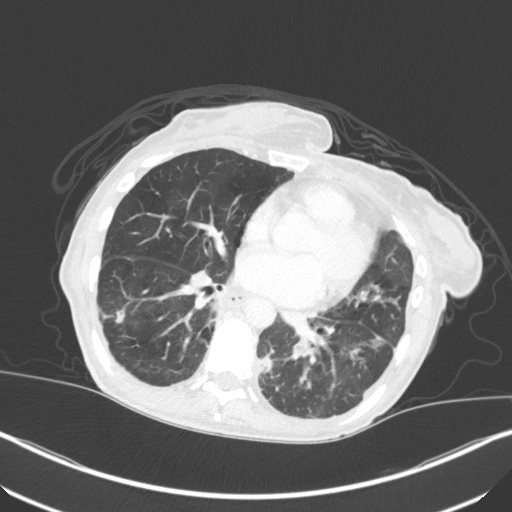
[im 88/158  lung]
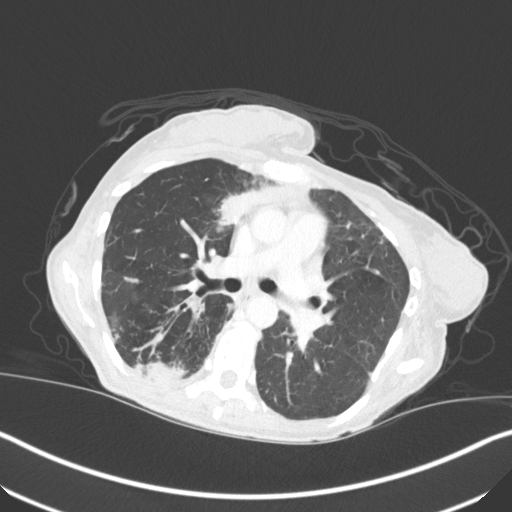
[im 99/158  lung]
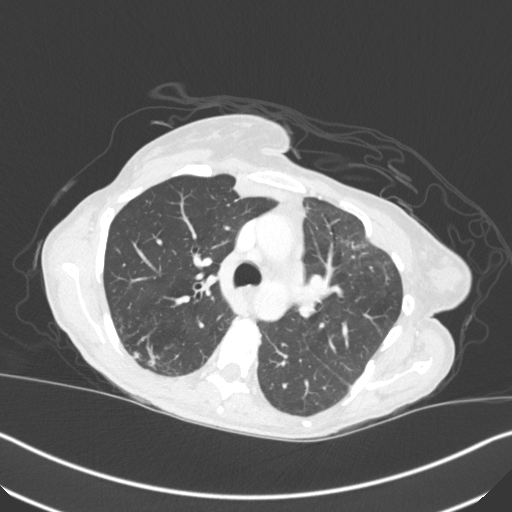
[im 111/158  mediastinal]
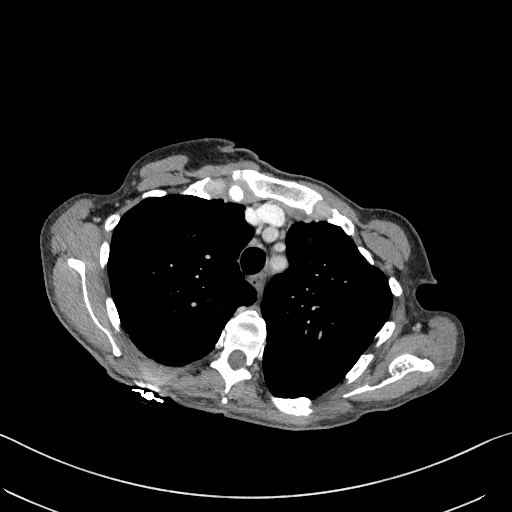
[im 111/158  lung]
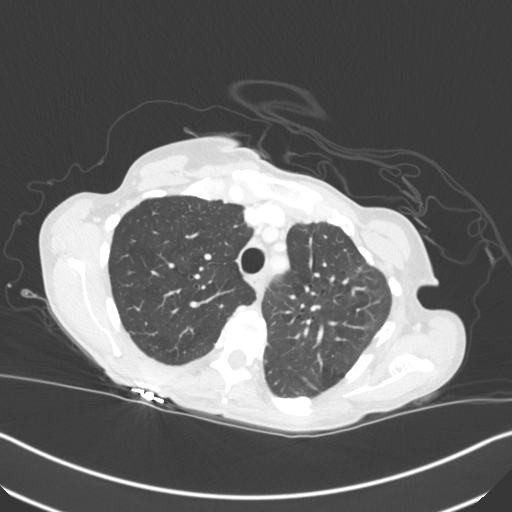
[im 123/158  lung]
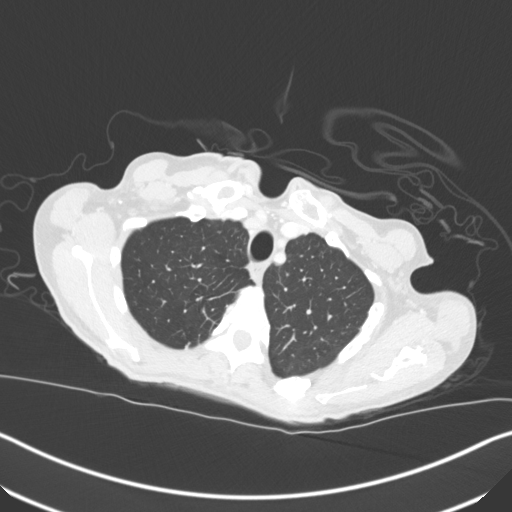
[im 134/158  lung]
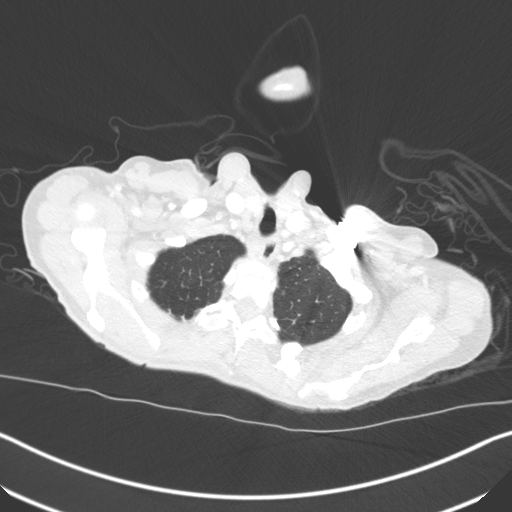
[im 146/158  lung]
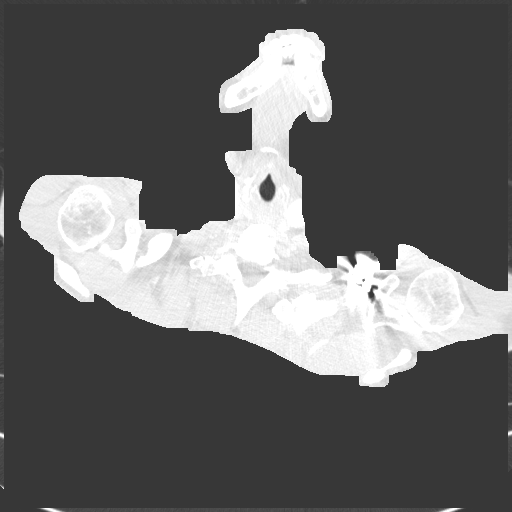

[Series 5: coronal · coronal · 0.61mm/px · 3 of 127 slices shown]
[im 26/127  lung]
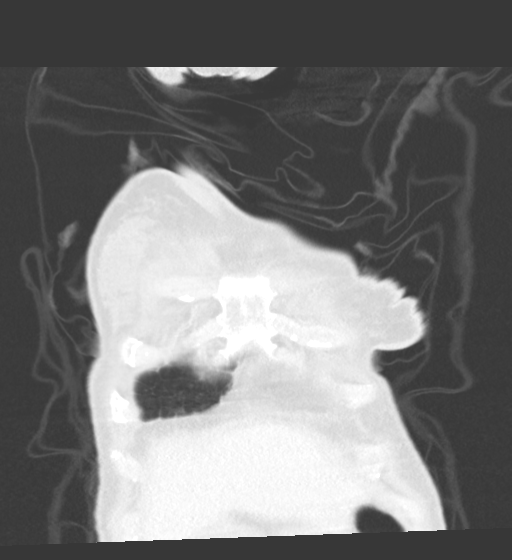
[im 51/127  lung]
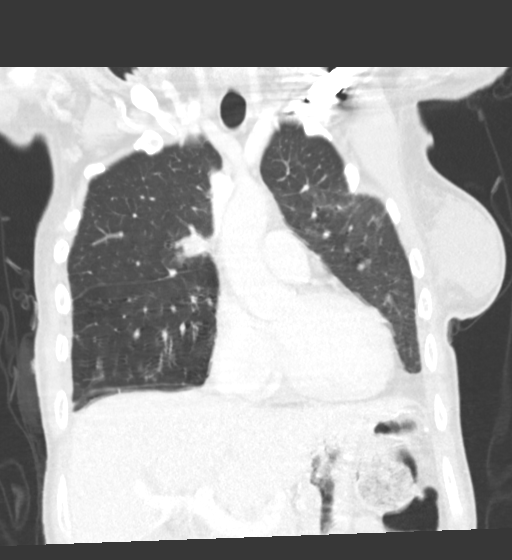
[im 76/127  lung]
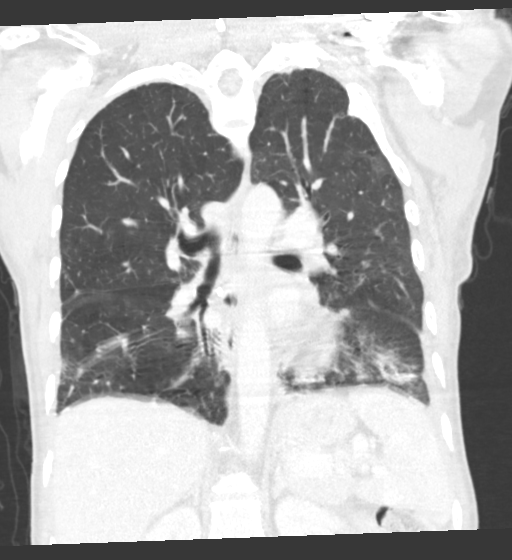

[15 of 36 positions shown; findings below may reference images not displayed]

RADIATION DOSE REDUCTION: This exam was performed according to the
departmental dose-optimization program which includes automated
exposure control, adjustment of the mA and/or kV according to
patient size and/or use of iterative reconstruction technique.

CONTRAST:  60mL OMNIPAQUE IOHEXOL 300 MG/ML  SOLN
FINDINGS: Cardiovascular: Aortic atherosclerosis. Normal heart size. Left
coronary artery calcifications. No pericardial effusion.

Mediastinum/Nodes: No enlarged mediastinal, hilar, or axillary lymph
nodes. Thyroid gland, trachea, and esophagus demonstrate no
significant findings.

Lungs/Pleura: Diffuse bilateral bronchial wall thickening. There is
extensive, fluctuant heterogeneous airspace opacity and
consolidation as well as associated clustered centrilobular and
tree-in-bud nodularity, primarily in the dependent bilateral lung
bases. Although the overall constellation of findings is improved,
there are multiple new areas of new and fluctuant airspace opacity,
for example in the medial right upper lobe (series 4, image 72).
Improved, small bilateral pleural effusions, which may be loculated.

Upper Abdomen: No acute abnormality.

Musculoskeletal: No chest wall abnormality. No suspicious osseous
lesions identified. Plate and screw fixation of the left clavicle.
Numerous chronic, callused fracture deformities of the left ribs.
IMPRESSION: 1. There is extensive, fluctuant heterogeneous airspace opacity and
consolidation as well as associated clustered centrilobular and
tree-in-bud nodularity, primarily in the dependent bilateral lung
bases although seen throughout the lungs. Although the overall
constellation of findings is improved, there are areas of new and
fluctuant airspace opacity. Findings remain consistent with
multifocal infection or aspiration.
2. Diffuse bilateral bronchial wall thickening, consistent with
infectious or inflammatory bronchitis.
3. Improved, small bilateral pleural effusions, which may be
loculated.
4. Coronary artery disease.

Aortic Atherosclerosis ([Z3]-[Z3]).

## 2021-04-23 MED ORDER — IOHEXOL 300 MG/ML  SOLN: 75.0000 mL | Freq: Once | INTRAMUSCULAR | Status: DC | PRN

## 2021-04-23 MED ORDER — IOHEXOL 300 MG/ML  SOLN
60.0000 mL | Freq: Once | INTRAMUSCULAR | Status: AC | PRN
  Administered 2021-04-23: 60 mL via INTRAVENOUS

## 2021-04-28 ENCOUNTER — Encounter: Payer: Self-pay | Admitting: Pulmonary Disease

## 2021-04-28 ENCOUNTER — Ambulatory Visit (HOSPITAL_COMMUNITY): Payer: Medicare Other | Admitting: Hematology

## 2021-04-30 ENCOUNTER — Ambulatory Visit: Payer: Medicare Other | Admitting: Pulmonary Disease

## 2021-05-04 ENCOUNTER — Other Ambulatory Visit: Payer: Self-pay

## 2021-05-04 ENCOUNTER — Inpatient Hospital Stay (HOSPITAL_BASED_OUTPATIENT_CLINIC_OR_DEPARTMENT_OTHER): Payer: Medicare Other | Admitting: Hematology

## 2021-05-04 VITALS — BP 105/70 | HR 100 | Temp 98.6°F | Resp 16 | Ht 65.0 in | Wt 106.0 lb

## 2021-05-04 DIAGNOSIS — R918 Other nonspecific abnormal finding of lung field: Secondary | ICD-10-CM | POA: Diagnosis not present

## 2021-05-04 DIAGNOSIS — R058 Other specified cough: Secondary | ICD-10-CM | POA: Diagnosis not present

## 2021-05-04 DIAGNOSIS — D649 Anemia, unspecified: Secondary | ICD-10-CM | POA: Diagnosis not present

## 2021-05-04 DIAGNOSIS — J90 Pleural effusion, not elsewhere classified: Secondary | ICD-10-CM | POA: Diagnosis not present

## 2021-05-04 DIAGNOSIS — R634 Abnormal weight loss: Secondary | ICD-10-CM | POA: Diagnosis not present

## 2021-05-04 DIAGNOSIS — F209 Schizophrenia, unspecified: Secondary | ICD-10-CM | POA: Diagnosis not present

## 2021-05-04 NOTE — Patient Instructions (Addendum)
[place] at [facility] Main Discharge Instructions   You were seen and examined today by Dr. Delton Coombes.  The lung lesions seen on scan were mostly infectious - follow up with your lung doctor.  Your anemia has improved - your most recent hemoglobin 11.5.  Your weight has improved after Boost supplements.  Return to clinic as needed. No follow up is required at this time.    Thank you for choosing [place] at [facility] to provide your oncology and hematology care.  To afford each patient quality time with our provider, please arrive at least 15 minutes before your scheduled appointment time.   If you have a lab appointment with the [place] please come in thru the Main Entrance and check in at the main information desk.  You need to re-schedule your appointment should you arrive 10 or more minutes late.  We strive to give you quality time with our providers, and arriving late affects you and other patients whose appointments are after yours.  Also, if you no show three or more times for appointments you may be dismissed from the clinic at the providers discretion.     Again, thank you for choosing [facility].  Our hope is that these requests will decrease the amount of time that you wait before being seen by our physicians.       _____________________________________________________________  Should you have questions after your visit to [facility], please contact our office at [phone] and follow the prompts.  Our office hours are 8:00 a.m. and 4:30 p.m. Monday - Friday.  Please note that voicemails left after 4:00 p.m. may not be returned until the following business day.  We are closed weekends and major holidays.  You do have access to a nurse 24-7, just call the main number to the clinic [phone] and do not press any options, hold on the line and a nurse will answer the phone.    For prescription  refill requests, have your pharmacy contact our office and allow 72 hours.    Due to Covid, you will need to wear a mask upon entering the hospital. If you do not have a mask, a mask will be given to you at the Main Entrance upon arrival. For doctor visits, patients may have 1 support person age 39 or older with them. For treatment visits, patients can not have anyone with them due to social distancing guidelines and our immunocompromised population.

## 2021-05-04 NOTE — Progress Notes (Signed)
Margaret [name], [place] [id number]   CLINIC:  Medical Oncology/Hematology  PCP:  Dettinger, Margaret Kaufmann, MD [place] / [name] Margaret [name] [phone]   REASON FOR VISIT:  Follow-up for pulmonary nodules  PRIOR THERAPY: none  NGS Results: not done  CURRENT THERAPY: under work-up  BRIEF ONCOLOGIC HISTORY:  Oncology History   No history exists.    CANCER STAGING:  Cancer Staging  No matching staging information was found for the patient.  INTERVAL HISTORY:  [name], a 51 y.o. female, returns for routine follow-up of her pulmonary nodules. Margaret [name] was last seen on 03/18/2021.   Today she reports feeling good. She continues to have productive cough. She is drinking Boost 3 times daily. She has gained 6 lbs since 03/31/2021.  REVIEW OF SYSTEMS:  Review of Systems  Constitutional:  Negative for appetite change, fatigue and unexpected weight change (+6 lbs).  Respiratory:  Positive for cough (productive).   All other systems reviewed and are negative.  PAST MEDICAL/SURGICAL HISTORY:  Past Medical History:  Diagnosis Date   Hematuria 11/12/2014   Hepatitis B carrier (HCC)    Hypothyroidism    Impulse disorder    IUD (intrauterine device) in place 11/12/2014   Inserted 12/24/12 at [facility]   Mild intellectual disability    Schizophrenia ([place])    Schizophrenia ([place])    Tuberculosis    as a child   Urinary frequency 11/12/2014   Past Surgical History:  Procedure Laterality Date   ARM WOUND REPAIR / CLOSURE     TRACHEAL SURGERY     TRACHEOSTOMY      SOCIAL HISTORY:  Social History   Socioeconomic History   Marital status: Single    Spouse name: Not on file   Number of children: 0   Years of education: 12   Highest education level: High school graduate  Occupational History   Occupation: disabled    Comment: due to schizophrenia  Tobacco Use   Smoking status: Never   Smokeless tobacco: Never  Vaping Use   Vaping  Use: Never used  Substance and Sexual Activity   Alcohol use: No    Alcohol/week: 0.0 standard drinks   Drug use: No   Sexual activity: Not Currently    Birth control/protection: I.U.D.  Other Topics Concern   Not on file  Social History Narrative   Patient was adopted. She does not have any children and has never been married. She lives in a group home and is disabled. Has a diagnosis of schizophrenia. She socially isolates herself, doesn't like to participate in group activities, sleeps a lot, has to be prompted to bathe, eat, dress, etc. -07/22/20   Social Determinants of Health   Financial Resource Strain: Low Risk    Difficulty of Paying Living Expenses: Not hard at all  Food Insecurity: No Food Insecurity   Worried About Charity fundraiser in the Last Year: Never true   [place] in the Last Year: Never true  Transportation Needs: No Transportation Needs   Lack of Transportation (Medical): No   Lack of Transportation (Non-Medical): No  Physical Activity: Insufficiently Active   Days of Exercise per Week: 1 day   Minutes of Exercise per Session: 20 min  Stress: Stress Concern Present   Feeling of Stress : To some extent  Social Connections: Socially Isolated   Frequency of Communication with Friends and Family: More than three times a week  Frequency of Social Gatherings with Friends and Family: More than three times a week   Attends Religious Services: Never   Marine scientist or Organizations: No   Attends Music therapist: Never   Marital Status: Never married  Human resources officer Violence: Not At Risk   Fear of Current or Ex-Partner: No   Emotionally Abused: No   Physically Abused: No   Sexually Abused: No    FAMILY HISTORY:  Family History  Adopted: Yes  Family history unknown: Yes    CURRENT MEDICATIONS:  Current Outpatient Medications  Medication Sig Dispense Refill   acetaminophen (TYLENOL) 650 MG CR tablet Take 650 mg by mouth  every 8 (eight) hours as needed for pain.     albuterol (VENTOLIN HFA) 108 (90 Base) MCG/ACT inhaler INHALE 2 PUFFS EVERY 4 HOURS AS NEEDED FOR WHEEZING. 36 g 2   Amantadine HCl 100 MG tablet Take 100 mg by mouth every morning.     Cholecalciferol (VITAMIN D3) 50 MCG (2000 UT) capsule TAKE 1 CAPSULE BY MOUTH EVERY MORNING. 30 capsule 11   cloZAPine (CLOZARIL) 100 MG tablet Take 200 mg by mouth at bedtime.     entecavir (BARACLUDE) 0.5 MG tablet TAKE (1) TABLET BY MOUTH ONCE DAILY. (Patient taking differently: Take 0.5 mg by mouth daily.) 30 tablet 11   Fish Oil-Cholecalciferol (FISH OIL + D3) 1200-1000 MG-UNIT CAPS TAKE 1 CAPSULE BY MOUTH TWICE DAILY. 60 capsule 11   FLUoxetine (PROZAC) 20 MG capsule TAKE 3 CAPSULES BY MOUTH ONCE DAILY. 90 capsule 11   fluticasone (FLONASE) 50 MCG/ACT nasal spray SPRAY 2 SPRAYS IN RIGHT NOSTRIL ONLY TWICE DAILY. 16 g 11   Gauze Pads & Dressings (STERILE GAUZE) 3"X3" PADS 2 packets by Does not apply route daily. 30 each 1   guaiFENesin (MUCINEX) 600 MG 12 hr tablet Take 1 tablet (600 mg total) by mouth 2 (two) times daily. 60 tablet 2   hydrOXYzine (ATARAX) 25 MG tablet Take 25 mg by mouth every 8 (eight) hours.     hydrOXYzine (VISTARIL) 25 MG capsule TAKE 1 CAPSULE BY MOUTH EVERY 8 HOURS AS NEEDED FOR ANXIETY 90 capsule 11   levonorgestrel (MIRENA) 20 MCG/24HR IUD 1 each by Intrauterine route once.     mirtazapine (REMERON) 15 MG tablet Take 15 mg by mouth at bedtime.     polyethylene glycol powder (GLYCOLAX/MIRALAX) 17 GM/SCOOP powder Take 17 g by mouth daily. (Patient taking differently: Take 17 g by mouth daily as needed.) 507 g 11   senna-docusate (SENOKOT-S) 8.6-50 MG tablet Take 1 tablet by mouth 2 (two) times daily between meals as needed for mild constipation. 180 tablet 0   Skin Protectants, Misc. (MINERIN CREME) CREA APPLY MODERATE AMOUNT TOPICALLY TO DRY SKIN ON SOLES & HEELS OF BOTH FEET AFTER SHOWER. (NURSING STAFF TO SUPERVISE APPLICATION) (Patient  taking differently: 1 application See admin instructions. Apply moderate amount topically to dry skin on soles and heels of both feet after shower) 454 g PRN   Transparent Dressings (TEGADERM FILM 6"X8") MISC 1 each by Does not apply route daily. 1 each 1   vitamin E 200 UNIT capsule Take 1 capsule (200 Units total) by mouth daily. 30 capsule 11   No current facility-administered medications for this visit.    ALLERGIES:  No Known Allergies  PHYSICAL EXAM:  Performance status (ECOG): 1 - Symptomatic but completely ambulatory  Vitals:   05/04/21 0920  BP: 105/70  Pulse: 100  Resp: 16  Temp: 98.6  F (37 C)   Wt Readings from Last 3 Encounters:  05/04/21 106 lb (48.1 kg)  04/22/21 103 lb (46.7 kg)  03/31/21 100 lb 9.6 oz (45.6 kg)   Physical Exam Vitals reviewed.  Constitutional:      Appearance: Normal appearance.  Cardiovascular:     Rate and Rhythm: Normal rate and regular rhythm.     Pulses: Normal pulses.     Heart sounds: Normal heart sounds.  Pulmonary:     Effort: Pulmonary effort is normal.     Breath sounds: Normal breath sounds.  Neurological:     General: No focal deficit present.     Mental Status: She is alert and oriented to person, place, and time.  Psychiatric:        Mood and Affect: Mood normal.        Behavior: Behavior normal.     LABORATORY DATA:  I have reviewed the labs as listed.  CBC Latest Ref Rng & Units 03/22/2021 03/07/2021 03/06/2021  WBC 4.0 - 10.5 K/uL 7.2 15.1(H) 17.3(H)  Hemoglobin 12.0 - 15.0 g/dL 11.5(L) 8.6(L) 9.2(L)  Hematocrit 36.0 - 46.0 % 36.7 28.0(L) 30.3(L)  Platelets 150.0 - 400.0 K/uL 366.0 335 318   CMP Latest Ref Rng & Units 03/22/2021 03/07/2021 03/06/2021  Glucose 70 - 99 mg/dL 81 84 121(H)  BUN 6 - 23 mg/dL 16 9 7   Creatinine 0.40 - 1.20 mg/dL 0.73 0.47 0.44  Sodium 135 - 145 mEq/L 140 136 138  Potassium 3.5 - 5.1 mEq/L 4.0 4.5 3.2(L)  Chloride 96 - 112 mEq/L 101 105 103  CO2 19 - 32 mEq/L 32 28 27  Calcium 8.4 -  10.5 mg/dL 9.5 8.8(L) 8.5(L)  Total Protein 6.5 - 8.1 g/dL - - -  Total Bilirubin 0.3 - 1.2 mg/dL - - -  Alkaline Phos 38 - 126 U/L - - -  AST 15 - 41 U/L - - -  ALT 0 - 44 U/L - - -    DIAGNOSTIC IMAGING:  I have independently reviewed the scans and discussed with the patient. CT Chest W Contrast  Result Date: 04/24/2021 CLINICAL DATA:  Recent pneumonia and empyema, persistent cough EXAM: CT CHEST WITH CONTRAST TECHNIQUE: Multidetector CT imaging of the chest was performed during intravenous contrast administration. RADIATION DOSE REDUCTION: This exam was performed according to the departmental dose-optimization program which includes automated exposure control, adjustment of the mA and/or kV according to patient size and/or use of iterative reconstruction technique. CONTRAST:  75m OMNIPAQUE IOHEXOL 300 MG/ML  SOLN COMPARISON:  03/04/2021 FINDINGS: Cardiovascular: Aortic atherosclerosis. Normal heart size. Left coronary artery calcifications. No pericardial effusion. Mediastinum/Nodes: No enlarged mediastinal, hilar, or axillary lymph nodes. Thyroid gland, trachea, and esophagus demonstrate no significant findings. Lungs/Pleura: Diffuse bilateral bronchial wall thickening. There is extensive, fluctuant heterogeneous airspace opacity and consolidation as well as associated clustered centrilobular and tree-in-bud nodularity, primarily in the dependent bilateral lung bases. Although the overall constellation of findings is improved, there are multiple new areas of new and fluctuant airspace opacity, for example in the medial right upper lobe (series 4, image 72). Improved, small bilateral pleural effusions, which may be loculated. Upper Abdomen: No acute abnormality. Musculoskeletal: No chest wall abnormality. No suspicious osseous lesions identified. Plate and screw fixation of the left clavicle. Numerous chronic, callused fracture deformities of the left ribs. IMPRESSION: 1. There is extensive,  fluctuant heterogeneous airspace opacity and consolidation as well as associated clustered centrilobular and tree-in-bud nodularity, primarily in the dependent bilateral lung  bases although seen throughout the lungs. Although the overall constellation of findings is improved, there are areas of new and fluctuant airspace opacity. Findings remain consistent with multifocal infection or aspiration. 2. Diffuse bilateral bronchial wall thickening, consistent with infectious or inflammatory bronchitis. 3. Improved, small bilateral pleural effusions, which may be loculated. 4. Coronary artery disease. Aortic Atherosclerosis (ICD10-I70.0). Electronically Signed   By: Delanna Ahmadi M.D.   On: 04/24/2021 19:40   MR Pelvis W Wo Contrast  Result Date: 04/11/2021 CLINICAL DATA:  Evaluate right adnexal cyst seen on prior CT scan. EXAM: MRI PELVIS WITHOUT AND WITH CONTRAST TECHNIQUE: Multiplanar multisequence MR imaging of the pelvis was performed both before and after administration of intravenous contrast. CONTRAST:  41m GADAVIST GADOBUTROL 1 MMOL/ML IV SOLN COMPARISON:  Previous MRI pelvis 03/08/2021 and ultrasound 03/04/2021 FINDINGS: Urinary Tract: The bladder is largely decompressed/empty but no obvious abnormalities. Bowel: No rectal mass or inflammatory process. Significant colonic distention with gas and stool suggesting constipation. Vascular/Lymphatic: No significant findings. Reproductive: An IUD is noted in the uterus. No myometrial or endometrial abnormalities otherwise. The ovaries are unremarkable. No ovarian mass or cyst is identified. Other: No pelvic mass or free pelvic fluid collections. No inguinal mass or hernia. Musculoskeletal: No significant bony findings. IMPRESSION: 1. No ovarian mass or cyst is identified. 2. Significant colonic distention with gas and stool suggesting constipation. 3. IUD noted in the uterus. Electronically Signed   By: PMarijo SanesM.D.   On: 04/11/2021 13:29     ASSESSMENT:   Multiple lung nodules: - She was referred to our clinic for findings of multiple lung nodules based on CT scan of abdomen and pelvis from 01/20/2021. - She was recently hospitalized from 03/03/2021 through 03/09/2021 with multifocal pneumonia. - She has chronic cough as a child and has greenish preterm.  Denies any hemoptysis.  No chest pains reported. - She has about 30 pound weight loss since April 2022. - She is accompanied by the manager of the group home.  She is apparently eating 3 meals per day and snacks in between and reports good appetite.  No vomiting or other GI symptoms.  She reports decrease in energy levels.    Social/family history: - She has intellectual disability, schizophrenia.  [place]  [facility] [place] - [place] [age][place]  She is a non-smoker.   PLAN:  Bilateral lung nodules: -I have reviewed CT of the chest from 04/24/2021.  There is extensive fluctuant heterogeneous airspace opacity and consolidation, seen throughout the lungs.  Overall findings have improved from prior scan from 03/04/2021.  There are some new areas of opacities.  This is mostly consistent with infection.  Improved small bilateral pleural effusions. - She was also evaluated by Dr. ELoanne Drillingfrom pulmonology and was started on Keflex for 5 days for right thorax cellulitis. - These lung lesions are unlikely malignancy.  Hence I have recommended her to follow-up with pulmonology.  I will be glad to see her on an as-needed basis.  2.  Normocytic anemia: -CBC on 03/07/2021 showed hemoglobin 8.6 with MCV 84. - Repeat CBC on 03/22/2021 shows hemoglobin 11.5 with MCV 86.  No further work-up needed.  3.  Weight loss: -She had a history of 30 pound weight loss since April 2022. - She was started on boost 3 times daily.  Her weight has  improved at this time.    Orders placed this  encounter:  No orders of the defined types were placed in this encounter.    Derek Jack, MD [place] [phone]   I, Thana Ates, am acting as a scribe for Dr. Derek Jack.  I, Derek Jack MD, have reviewed the above documentation for accuracy and completeness, and I agree with the above.

## 2021-05-20 ENCOUNTER — Ambulatory Visit (INDEPENDENT_AMBULATORY_CARE_PROVIDER_SITE_OTHER): Payer: Medicare Other | Admitting: Pulmonary Disease

## 2021-05-20 ENCOUNTER — Ambulatory Visit (INDEPENDENT_AMBULATORY_CARE_PROVIDER_SITE_OTHER): Payer: Medicare Other

## 2021-05-20 ENCOUNTER — Other Ambulatory Visit: Payer: Self-pay

## 2021-05-20 ENCOUNTER — Encounter: Payer: Self-pay | Admitting: Pulmonary Disease

## 2021-05-20 VITALS — BP 98/60 | HR 107 | Temp 98.3°F | Ht 65.0 in | Wt 107.4 lb

## 2021-05-20 DIAGNOSIS — R059 Cough, unspecified: Secondary | ICD-10-CM | POA: Diagnosis not present

## 2021-05-20 DIAGNOSIS — Z8709 Personal history of other diseases of the respiratory system: Secondary | ICD-10-CM | POA: Diagnosis not present

## 2021-05-20 DIAGNOSIS — R918 Other nonspecific abnormal finding of lung field: Secondary | ICD-10-CM | POA: Diagnosis not present

## 2021-05-20 DIAGNOSIS — J852 Abscess of lung without pneumonia: Secondary | ICD-10-CM

## 2021-05-20 IMAGING — DX DG CHEST 2V
2 series · 2 of 2 positions shown · non-contrast
Comparison: Chest radiograph dated [DATE], CT
examination dated [DATE]

CLINICAL DATA: Cough

EXAM:
CHEST - 2 VIEW

[chest pa]
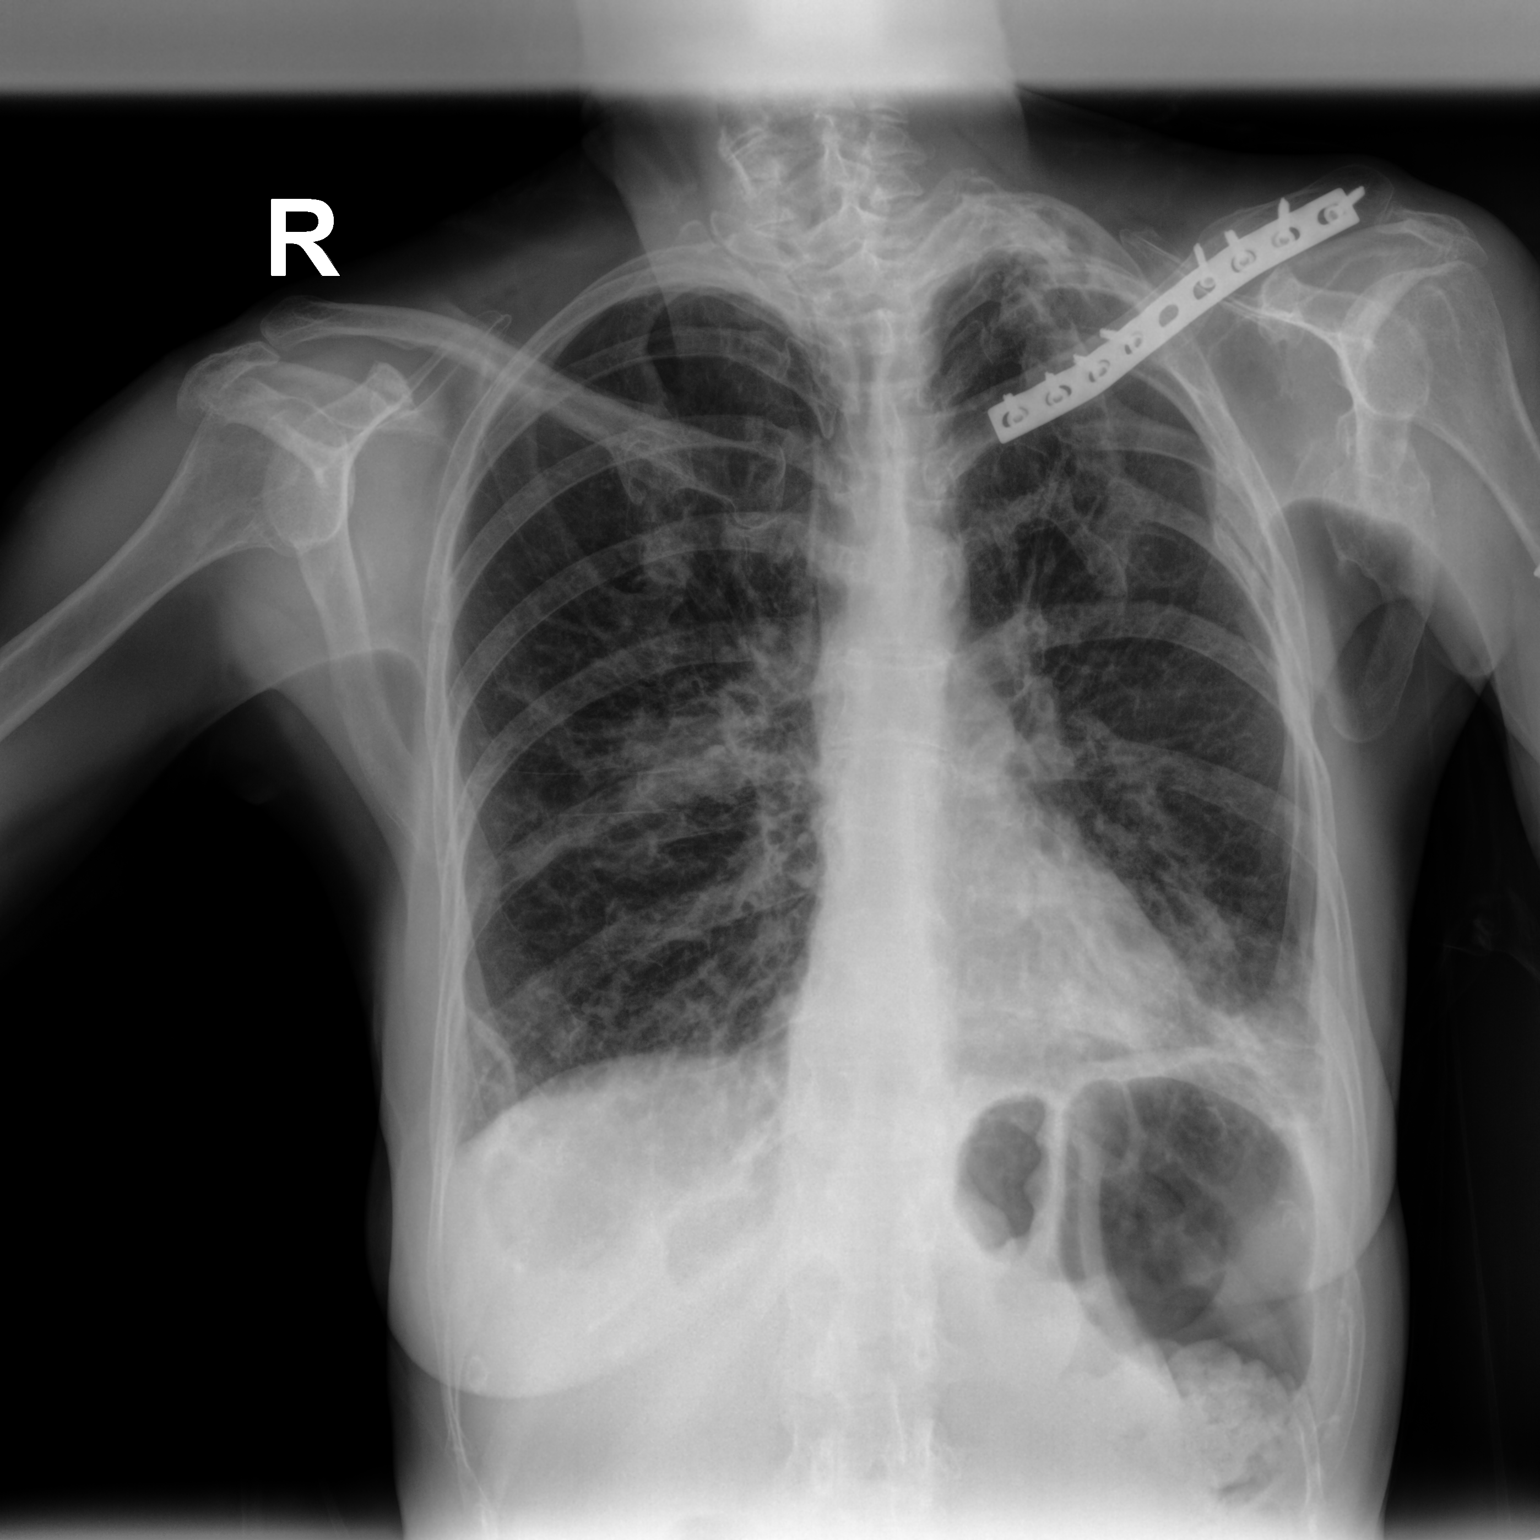

[chest lat]
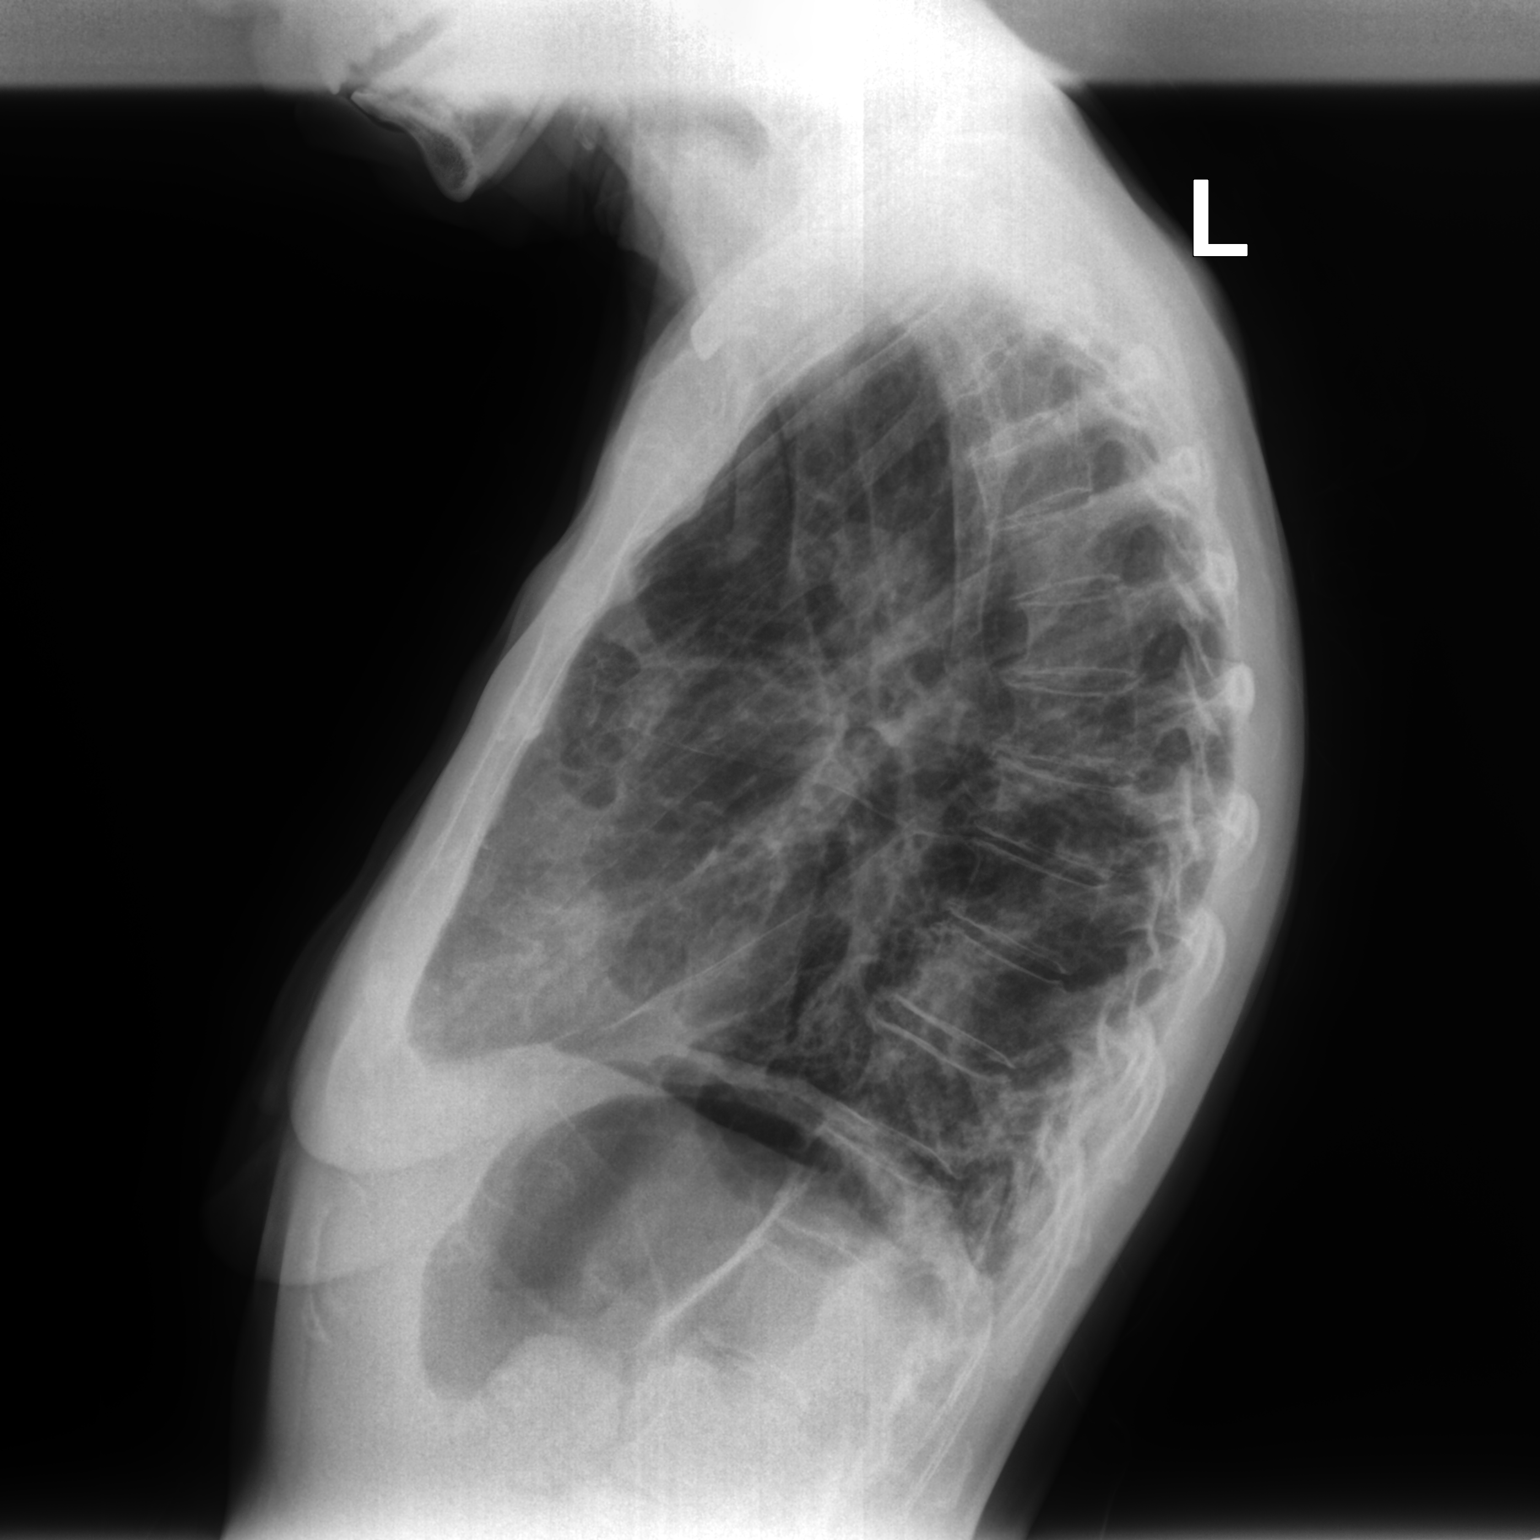

[2 of 2 positions shown; findings below may reference images not displayed]

FINDINGS: The heart size and mediastinal contours are within normal limits.
Left basilar opacity which may represent atelectasis or infiltrate,
not significantly changed. Bilateral lower lobe scarring/prominent
interstitial markings, unchanged. Left clavicular plate and screw
fixation.
IMPRESSION: 1. Left lower lobe opacity which may represent atelectasis or
infiltrate, unchanged.
2. 2. Scarring and reticulonodular opacities in bilateral lower
lungs, which may represent bronchitis/pneumonitis in the settings of
chronic fibrosis/interstitial lung disease.

## 2021-05-20 MED ORDER — AMOXICILLIN-POT CLAVULANATE 875-125 MG PO TABS: 1.0000 | ORAL_TABLET | Freq: Two times a day (BID) | ORAL | 0 refills | Status: AC

## 2021-05-20 NOTE — Patient Instructions (Addendum)
Right upper lobe opacity/lung abscess --START Augmentin x 14 days --Collect sputum for respiratory culture  Follow-up with me or NP in two weeks

## 2021-05-20 NOTE — Progress Notes (Signed)
@Patient  ID: Margaret [name], female    DOB: [birth year], 51 y.o.   MRN: [id number]  Chief Complaint  Patient presents with   Follow-up    Coughing up green stuff    Referring provider: Dettinger, Fransisca Kaufmann, MD  HPI: 51 year old female never smoker with emphysema, hx childhood TB, schizophrenia, complex ovarian cyst, intellectual disability, protein calorie malnutrition, hepatitis B, orthostatic hypotension. who presents for follow-up. Her caregiver is present.  Synopsis: She has had two recent hospitalizations related to multifocal pneumonia in setting of aspiration. First hospitalization complicated by empyema requiring chest tube in early November followed by readmission for pneumonia. Barium negative and was placed on regular diet.  04/22/21 Since our last visit she reports feeling overall well. Denies fevers chills. No cough, wheezing or shortness of breath. However she is concerned about the site on right thorax where her prior chest tube was in early November that is tender to touch. She has not kept it dressed or protected.   05/20/21 She presents with her caregiver. Since our last visit she has completed her antibiotics for cellulitis and removes resolved pain at prior chest tube site. She has productive cough that started 2 days ago. Amount of sputum significant with copious amount of thick yellow phlegm. Denies fevers, chills. Denies wheezing or shortness of breath.  Review of Systems  Constitutional:  Negative for chills, diaphoresis, fever, malaise/fatigue and weight loss.  HENT:  Negative for congestion.   Respiratory:  Positive for cough and shortness of breath. Negative for hemoptysis, sputum production and wheezing.   Cardiovascular:  Negative for chest pain, palpitations and leg swelling.   No Known Allergies  Immunization History  Administered Date(s) Administered   Influenza,inj,Quad PF,6+ Mos 12/30/2015, 12/30/2016, 03/14/2018, 02/20/2019   Influenza-Unspecified  01/12/2015, 01/07/2020, 01/09/2021   Moderna Covid-19 Vaccine Bivalent Booster 26yr & up 02/15/2021   Moderna Sars-Covid-2 Vaccination 06/10/2019, 07/09/2019, 02/11/2020    Past Medical History:  Diagnosis Date   Hematuria 11/12/2014   Hepatitis B carrier ([place]    Hypothyroidism    Impulse disorder    IUD (intrauterine device) in place 11/12/2014   [facility]  Mild intellectual disability    [place]ia [place]    [place]izophrenia ([place]    Tuberculosis    as a child   Urinary frequency 11/12/2014    Tobacco History: Social History   Tobacco Use  Smoking Status Never  Smokeless Tobacco Never   Counseling given: Not Answered   Outpatient Medications Prior to Visit  Medication Sig Dispense Refill   acetaminophen (TYLENOL) 650 MG CR tablet Take 650 mg by mouth every 8 (eight) hours as needed for pain.     albuterol (VENTOLIN HFA) 108 (90 Base) MCG/ACT inhaler INHALE 2 PUFFS EVERY 4 HOURS AS NEEDED FOR WHEEZING. 36 g 2   Amantadine HCl 100 MG tablet Take 100 mg by mouth every morning.     Cholecalciferol (VITAMIN D3) 50 MCG (2000 UT) capsule TAKE 1 CAPSULE BY MOUTH EVERY MORNING. 30 capsule 11   cloZAPine (CLOZARIL) 100 MG tablet Take 200 mg by mouth at bedtime.     entecavir (BARACLUDE) 0.5 MG tablet TAKE (1) TABLET BY MOUTH ONCE DAILY. (Patient taking differently: Take 0.5 mg by mouth daily.) 30 tablet 11   Fish Oil-Cholecalciferol (FISH OIL + D3) 1200-1000 MG-UNIT CAPS TAKE 1 CAPSULE BY MOUTH TWICE DAILY. 60 capsule 11   FLUoxetine (PROZAC) 20 MG capsule TAKE 3 CAPSULES BY MOUTH ONCE DAILY. 90 capsule 11  fluticasone (FLONASE) 50 MCG/ACT nasal spray SPRAY 2 SPRAYS IN RIGHT NOSTRIL ONLY TWICE DAILY. 16 g 11   Gauze Pads & Dressings (STERILE GAUZE) 3"X3" PADS 2 packets by Does not apply route daily. 30 each 1   guaiFENesin (MUCINEX) 600 MG 12 hr tablet Take 1 tablet (600 mg total) by mouth 2 (two) times daily. 60 tablet 2   hydrOXYzine (ATARAX) 25 MG tablet Take 25  mg by mouth every 8 (eight) hours.     hydrOXYzine (VISTARIL) 25 MG capsule TAKE 1 CAPSULE BY MOUTH EVERY 8 HOURS AS NEEDED FOR ANXIETY 90 capsule 11   levonorgestrel (MIRENA) 20 MCG/24HR IUD 1 each by Intrauterine route once.     mirtazapine (REMERON) 15 MG tablet Take 15 mg by mouth at bedtime.     polyethylene glycol powder (GLYCOLAX/MIRALAX) 17 GM/SCOOP powder Take 17 g by mouth daily. (Patient taking differently: Take 17 g by mouth daily as needed.) 507 g 11   senna-docusate (SENOKOT-S) 8.6-50 MG tablet Take 1 tablet by mouth 2 (two) times daily between meals as needed for mild constipation. 180 tablet 0   Skin Protectants, Misc. (MINERIN CREME) CREA APPLY MODERATE AMOUNT TOPICALLY TO DRY SKIN ON SOLES & HEELS OF BOTH FEET AFTER SHOWER. (NURSING STAFF TO SUPERVISE APPLICATION) (Patient taking differently: 1 application See admin instructions. Apply moderate amount topically to dry skin on soles and heels of both feet after shower) 454 g PRN   Transparent Dressings (TEGADERM FILM 6"X8") MISC 1 each by Does not apply route daily. 1 each 1   vitamin E 200 UNIT capsule Take 1 capsule (200 Units total) by mouth daily. 30 capsule 11   No facility-administered medications prior to visit.      Physical Exam:  BP 98/60 (BP Location: Left Arm, Cuff Size: Normal)    Pulse (!) 107    Temp 98.3 F (36.8 C) (Oral)    Ht 5' 5"  (1.651 m)    Wt 107 lb 6.4 oz (48.7 kg)    SpO2 96%    BMI 17.87 kg/m   Physical Exam: General: Well-appearing, no acute distress HENT: [place], AT Eyes: EOMI, no scleral icterus Respiratory: Clear to auscultation bilaterally.  No crackles, wheezing or rales Cardiovascular: RRR, -M/R/G, no JVD Extremities:-Edema,-tenderness Neuro: AAO x4, CNII-XII grossly intact Psych: Normal mood, normal affect Skin: Healed right chest tube site with minimal scar, no erythema or tenderness  Lab Results:  CBC    Component Value Date/Time   WBC 7.2 03/22/2021 1242   RBC 4.26 03/22/2021  1242   HGB 11.5 (L) 03/22/2021 1242   HGB 11.7 11/30/2020 1049   HCT 36.7 03/22/2021 1242   HCT 35.7 11/30/2020 1049   PLT 366.0 03/22/2021 1242   PLT 257 11/30/2020 1049   MCV 86.3 03/22/2021 1242   MCV 90 11/30/2020 1049   MCH 26.1 03/07/2021 0541   MCHC 31.2 03/22/2021 1242   RDW 21.7 (H) 03/22/2021 1242   RDW 11.2 (L) 11/30/2020 1049   LYMPHSABS 2.3 03/22/2021 1242   LYMPHSABS 1.5 11/30/2020 1049   MONOABS 0.5 03/22/2021 1242   EOSABS 0.2 03/22/2021 1242   EOSABS 0.1 11/30/2020 1049   BASOSABS 0.2 (H) 03/22/2021 1242   BASOSABS 0.1 11/30/2020 1049    BMET    Component Value Date/Time   NA 140 03/22/2021 1242   NA 144 11/30/2020 1049   K 4.0 03/22/2021 1242   CL 101 03/22/2021 1242   CO2 32 03/22/2021 1242   GLUCOSE 81 03/22/2021 1242  BUN 16 03/22/2021 1242   BUN 9 11/30/2020 1049   CREATININE 0.73 03/22/2021 1242   CREATININE 0.73 06/30/2020 1054   CALCIUM 9.5 03/22/2021 1242   GFRNONAA >60 03/07/2021 0541   GFRAA 102 01/08/2020 1045    BNP    Component Value Date/Time   BNP 70.2 02/01/2021 2146     Imaging: CT Chest 03/04/21 - Bilateral atelectasis and infiltrate, small bilateral pleural effusions CXR 03/22/22 - Bibasilar opacities, unchanged CT Chest 04/23/21 - Improved small bilateral pleural effusions with loculation. Overall improved bilateral airspace disease however interval RUL opacity CXR 05/20/21 - Opacities in bilateral lower lobes. LLL atelectasis.  CT Chest W Contrast  Result Date: 04/24/2021 CLINICAL DATA:  Recent pneumonia and empyema, persistent cough EXAM: CT CHEST WITH CONTRAST TECHNIQUE: Multidetector CT imaging of the chest was performed during intravenous contrast administration. RADIATION DOSE REDUCTION: This exam was performed according to the departmental dose-optimization program which includes automated exposure control, adjustment of the mA and/or kV according to patient size and/or use of iterative reconstruction technique.  CONTRAST:  26m OMNIPAQUE IOHEXOL 300 MG/ML  SOLN COMPARISON:  03/04/2021 FINDINGS: Cardiovascular: Aortic atherosclerosis. Normal heart size. Left coronary artery calcifications. No pericardial effusion. Mediastinum/Nodes: No enlarged mediastinal, hilar, or axillary lymph nodes. Thyroid gland, trachea, and esophagus demonstrate no significant findings. Lungs/Pleura: Diffuse bilateral bronchial wall thickening. There is extensive, fluctuant heterogeneous airspace opacity and consolidation as well as associated clustered centrilobular and tree-in-bud nodularity, primarily in the dependent bilateral lung bases. Although the overall constellation of findings is improved, there are multiple new areas of new and fluctuant airspace opacity, for example in the medial right upper lobe (series 4, image 72). Improved, small bilateral pleural effusions, which may be loculated. Upper Abdomen: No acute abnormality. Musculoskeletal: No chest wall abnormality. No suspicious osseous lesions identified. Plate and screw fixation of the left clavicle. Numerous chronic, callused fracture deformities of the left ribs. IMPRESSION: 1. There is extensive, fluctuant heterogeneous airspace opacity and consolidation as well as associated clustered centrilobular and tree-in-bud nodularity, primarily in the dependent bilateral lung bases although seen throughout the lungs. Although the overall constellation of findings is improved, there are areas of new and fluctuant airspace opacity. Findings remain consistent with multifocal infection or aspiration. 2. Diffuse bilateral bronchial wall thickening, consistent with infectious or inflammatory bronchitis. 3. Improved, small bilateral pleural effusions, which may be loculated. 4. Coronary artery disease. Aortic Atherosclerosis (ICD10-I70.0). Electronically Signed   By: ADelanna AhmadiM.D.   On: 04/24/2021 19:40      No flowsheet data found.  No results found for:  NITRICOXIDE      Assessment & Plan:  51year old female with never smoker with emphysema, hx childhood TB, schizophrenia, complex ovarian cyst, intellectual disability, protein calorie malnutrition, hepatitis B, orthostatic hypotension who presents for follow-up. Recent hospitalization for empyema with clinical improvement. Reviewed CT Chest on 04/23/21. Improved RLL effusion/infiltrate however interval development of RUL opacity. In setting of recent respiratory symptoms and prior lung abscess, will treat with prolonged course of antibiotics. CXR in clinic reviewed with RUL/RML infiltrate  Right thorax cellulitis - resolved after antibiotics  Right upper lobe opacity/lung abscess --START Augmentin x 14 days. If symptoms persistent will treat with additional one week and follow-up CT Chest --Collect sputum for respiratory culture  Follow-up with me or NP in 2 weeks  I have spent a total time of 35-minutes on the day of the appointment reviewing prior documentation, coordinating care and discussing medical diagnosis and plan with the  patient/family. Past medical history, allergies, medications were reviewed. Pertinent imaging, labs and tests included in this note have been reviewed and interpreted independently by me.  Woodley Petzold Rodman Pickle, MD 05/20/2021

## 2021-05-21 ENCOUNTER — Telehealth: Payer: Self-pay | Admitting: Family Medicine

## 2021-05-21 NOTE — Telephone Encounter (Signed)
We have still been helpful for symptoms, bring it by extremity and a copy what she has had previously.  as long as is what they want.

## 2021-05-21 NOTE — Telephone Encounter (Signed)
Seth Bake called from [facility] stating that they have an FL2 for patient that needs to be filled out by PCP today.   Tried explaining that patient needs an appt for FL2 and we don't have any openings today.  Wants to know if patient can be worked in or if PCP can fill FL2 out without appt.  Please advise and call Seth Bake at [phone]

## 2021-05-21 NOTE — Telephone Encounter (Signed)
Spoke with them - bringing it by for signature today

## 2021-05-22 ENCOUNTER — Encounter: Payer: Self-pay | Admitting: Pulmonary Disease

## 2021-05-23 LAB — RESPIRATORY CULTURE OR RESPIRATORY AND SPUTUM CULTURE
MICRO NUMBER:: 13018316
RESULT:: NORMAL
SPECIMEN QUALITY:: ADEQUATE

## 2021-06-04 ENCOUNTER — Other Ambulatory Visit: Payer: Self-pay

## 2021-06-04 ENCOUNTER — Encounter: Payer: Self-pay | Admitting: Nurse Practitioner

## 2021-06-04 ENCOUNTER — Ambulatory Visit (INDEPENDENT_AMBULATORY_CARE_PROVIDER_SITE_OTHER): Payer: Medicare Other | Admitting: Nurse Practitioner

## 2021-06-04 VITALS — BP 110/64 | HR 98 | Temp 98.2°F | Ht 63.0 in | Wt 105.2 lb

## 2021-06-04 DIAGNOSIS — J852 Abscess of lung without pneumonia: Secondary | ICD-10-CM

## 2021-06-04 DIAGNOSIS — J432 Centrilobular emphysema: Secondary | ICD-10-CM | POA: Diagnosis not present

## 2021-06-04 DIAGNOSIS — J189 Pneumonia, unspecified organism: Secondary | ICD-10-CM | POA: Diagnosis not present

## 2021-06-04 MED ORDER — PROBIOTIC 1-250 BILLION-MG PO CAPS: 1.0000 | ORAL_CAPSULE | Freq: Every morning | ORAL | 0 refills | Status: DC

## 2021-06-04 MED ORDER — AMOXICILLIN-POT CLAVULANATE 875-125 MG PO TABS: 1.0000 | ORAL_TABLET | Freq: Two times a day (BID) | ORAL | 0 refills | Status: DC

## 2021-06-04 NOTE — Patient Instructions (Addendum)
-  Continue albuterol inhaler 2 puffs every 4 hours as needed for shortness of breath or wheezing ?-Continue Flonase nasal spray 2 sprays each nostril daily ?-Continue mucinex 600 mg Twice daily  ?-Continue flutter valve 2-3 times per day ? ?Extend augmentin for another week. Take 1 capsule Twice daily for 7 days. Take with food.  ?Take daily probiotic 1 capsule daily in morning for 14 days.  ?  ?Aspiration precautions discussed. Continue with diet as recommended by speech. Ensure supplements for protein deficiency.  ?  ?Follow up in two weeks with Dr. Loanne Drilling. If symptoms do not improve or worsen, please contact office for sooner follow up or seek emergency care ?

## 2021-06-04 NOTE — Assessment & Plan Note (Signed)
Recent interval development of bilateral lower lobe opacities. Initiated on prolonged abx course with augmentin. Slowly improving; residual productive cough and minimal SOB. Given previous abscess and recurrence of pna, will extend augmentin course for another week. Repeat CT chest for further evaluation upon completion. Advised to notify of any worsening symptoms.  ? ?Patient Instructions  ?-Continue albuterol inhaler 2 puffs every 4 hours as needed for shortness of breath or wheezing ?-Continue Flonase nasal spray 2 sprays each nostril daily ?-Continue mucinex 600 mg Twice daily  ?-Continue flutter valve 2-3 times per day ? ?Extend augmentin for another week. Take 1 capsule Twice daily for 7 days. Take with food.  ?Take daily probiotic 1 capsule daily in morning for 14 days.  ?  ?Aspiration precautions discussed. Continue with diet as recommended by speech. Ensure supplements for protein deficiency.  ?  ?Follow up in two weeks with Dr. Loanne Drilling. If symptoms do not improve or worsen, please contact office for sooner follow up or seek emergency care ? ? ?

## 2021-06-04 NOTE — Assessment & Plan Note (Signed)
Evident on CT scan. Has not had formal PFTs. If SOB persists despite clearing of pna, will order PFTs for further evaluation.  ?

## 2021-06-04 NOTE — Addendum Note (Signed)
Addended by: Clayton Bibles on: 06/04/2021 03:26 PM ? ? Modules accepted: Orders ? ?

## 2021-06-04 NOTE — Progress Notes (Signed)
@Patient  ID: Margaret [name], female    DOB: 02-Jul-1970, 51 y.o.   MRN: [id number]  Chief Complaint  Patient presents with   Follow-up    2 week follow up. Pt states she has some issues noted with her breathing. But she states that she is doing better since last visit.     Referring provider: Dettinger, Fransisca Kaufmann, MD  HPI: 51 year old female, never smoker followed for emphysema, hx of childhood TB, multifocal pneumonia in setting of aspiration, pleural effusion, and empyema. She is a patient of Dr. Cordelia Pen and last seen in office 05/20/2021. Past medical history significant for schizophrenia, complex ovarian cyst, intellectual disability, protein calorie malnutrition, hepatitis B. She has had two recent hospitalizations related to multifocal pneumonia, most recently 03/03/2021 to 03/09/2021. First stay complicated by empyema requiring chest tube.  TEST/EVENTS:  03/04/2021 CT chest: b/l atelectasis and infiltrate, small b/l pleural effusions 03/22/2022 CXR: bibasilar opacities, unchanged  04/23/2021 CT chest: improved small b/l pleural effusions with loculation. Overall improved b/l airspace disease; however, interval RUL opacity noted 05/20/2021 CXR 2 view: opacities in b/l lower lobes. LLL atelectasis present.   05/20/2021: OV with Dr. Loanne Drilling. Previously seen and treated for cellulitis at chest tube site. At visit, noted to have a productive cough with significant sputum - CXR with interval development of opacities in b/l lower lobes. Tx with prolonged course of abx - 2 weeks augmentin. Sputum culture with normal respiratory flora.   06/04/2021: Today - follow up Patient presents today with caregiver for follow up. She reports feeling better; although she is still experiencing some mild shortness of breath with exertion and productive cough with yellow to green sputum. She completed her abx course yesterday and tolerated it well. She has not required use of her albuterol inhaler. She denies any  fevers, fatigue or weakness.   No Known Allergies  Immunization History  Administered Date(s) Administered   Influenza,inj,Quad PF,6+ Mos 12/30/2015, 12/30/2016, 03/14/2018, 02/20/2019   Influenza-Unspecified 01/12/2015, 01/07/2020, 01/09/2021   Moderna Covid-19 Vaccine Bivalent Booster 83yr & up 02/15/2021   Moderna Sars-Covid-2 Vaccination 06/10/2019, 07/09/2019, 02/11/2020    Past Medical History:  Diagnosis Date   Hematuria 11/12/2014   [place]    Hypothyroidism    Impulse disorder    IUD (intrauterine device) in place 11/12/2014   [facility]  Mild intellectual disability    [place]ia [place]    [place]    Tuberculosis    as a child   Urinary frequency 11/12/2014    Tobacco History: Social History   Tobacco Use  Smoking Status Never  Smokeless Tobacco Never   Counseling given: Not Answered   Outpatient Medications Prior to Visit  Medication Sig Dispense Refill   acetaminophen (TYLENOL) 650 MG CR tablet Take 650 mg by mouth every 8 (eight) hours as needed for pain.     albuterol (VENTOLIN HFA) 108 (90 Base) MCG/ACT inhaler INHALE 2 PUFFS EVERY 4 HOURS AS NEEDED FOR WHEEZING. 36 g 2   Amantadine HCl 100 MG tablet Take 100 mg by mouth every morning.     Cholecalciferol (VITAMIN D3) 50 MCG (2000 UT) capsule TAKE 1 CAPSULE BY MOUTH EVERY MORNING. 30 capsule 11   cloZAPine (CLOZARIL) 100 MG tablet Take 200 mg by mouth at bedtime.     entecavir (BARACLUDE) 0.5 MG tablet TAKE (1) TABLET BY MOUTH ONCE DAILY. (Patient taking differently: Take 0.5 mg by mouth daily.) 30 tablet 11   Fish Oil-Cholecalciferol (FISH  OIL + D3) 1200-1000 MG-UNIT CAPS TAKE 1 CAPSULE BY MOUTH TWICE DAILY. 60 capsule 11   FLUoxetine (PROZAC) 20 MG capsule TAKE 3 CAPSULES BY MOUTH ONCE DAILY. 90 capsule 11   fluticasone (FLONASE) 50 MCG/ACT nasal spray SPRAY 2 SPRAYS IN RIGHT NOSTRIL ONLY TWICE DAILY. 16 g 11   Gauze Pads & Dressings (STERILE GAUZE) 3"X3"  PADS 2 packets by Does not apply route daily. 30 each 1   guaiFENesin (MUCINEX) 600 MG 12 hr tablet Take 1 tablet (600 mg total) by mouth 2 (two) times daily. 60 tablet 2   hydrOXYzine (ATARAX) 25 MG tablet Take 25 mg by mouth every 8 (eight) hours.     hydrOXYzine (VISTARIL) 25 MG capsule TAKE 1 CAPSULE BY MOUTH EVERY 8 HOURS AS NEEDED FOR ANXIETY 90 capsule 11   levonorgestrel (MIRENA) 20 MCG/24HR IUD 1 each by Intrauterine route once.     mirtazapine (REMERON) 15 MG tablet Take 15 mg by mouth at bedtime.     polyethylene glycol powder (GLYCOLAX/MIRALAX) 17 GM/SCOOP powder Take 17 g by mouth daily. (Patient taking differently: Take 17 g by mouth daily as needed.) 507 g 11   senna-docusate (SENOKOT-S) 8.6-50 MG tablet Take 1 tablet by mouth 2 (two) times daily between meals as needed for mild constipation. 180 tablet 0   Skin Protectants, Misc. (MINERIN CREME) CREA APPLY MODERATE AMOUNT TOPICALLY TO DRY SKIN ON SOLES & HEELS OF BOTH FEET AFTER SHOWER. (NURSING STAFF TO SUPERVISE APPLICATION) (Patient taking differently: 1 application See admin instructions. Apply moderate amount topically to dry skin on soles and heels of both feet after shower) 454 g PRN   Transparent Dressings (TEGADERM FILM 6"X8") MISC 1 each by Does not apply route daily. 1 each 1   vitamin E 200 UNIT capsule Take 1 capsule (200 Units total) by mouth daily. 30 capsule 11   No facility-administered medications prior to visit.     Review of Systems:   Constitutional: No weight loss or gain, night sweats, fevers, chills, fatigue, or lassitude. HEENT: No headaches, difficulty swallowing, tooth/dental problems, or sore throat. No sneezing, itching, ear ache, nasal congestion, or post nasal drip CV:  No chest pain, orthopnea, PND, swelling in lower extremities, anasarca, dizziness, palpitations, syncope Resp: + minimal shortness of breath with exertion; productive cough (yellow to green; slightly improved).  No hemoptysis. No  wheezing.  No chest wall deformity GI:  No heartburn, indigestion, abdominal pain, nausea, vomiting, diarrhea, change in bowel habits, loss of appetite, bloody stools.  GU: No dysuria, change in color of urine, urgency or frequency.  No flank pain, no hematuria  Skin: No rash, lesions, ulcerations MSK:  No joint pain or swelling.  No decreased range of motion.  No back pain. Neuro: No dizziness or lightheadedness.  Psych: No depression or anxiety. Mood stable.     Physical Exam:  BP 110/64 (BP Location: Left Arm, Patient Position: Sitting, Cuff Size: Normal)    Pulse 98    Temp 98.2 F (36.8 C) (Oral)    Ht 5' 3"  (1.6 m)    Wt 105 lb 3.2 oz (47.7 kg)    SpO2 97%    BMI 18.64 kg/m   GEN: Pleasant, interactive, well-appearing; in no acute distress. HEENT:  Normocephalic and atraumatic. PERRLA. Sclera white. Nasal turbinates pink, moist and patent bilaterally. No rhinorrhea present. Oropharynx pink and moist, without exudate or edema. No lesions, ulcerations, or postnasal drip.  NECK:  Supple w/ fair ROM. No JVD present. Normal carotid  impulses w/o bruits. Thyroid symmetrical with no goiter or nodules palpated. No lymphadenopathy.   CV: RRR, no m/r/g, no peripheral edema. Pulses intact, +2 bilaterally. No cyanosis, pallor or clubbing. PULMONARY:  Unlabored, regular breathing. Clear bilaterally A&P w/o wheezes/rales/rhonchi. No accessory muscle use. No dullness to percussion. GI: BS present and normoactive. Soft, non-tender to palpation.  MSK: No erythema, warmth or tenderness. No deformities or joint swelling noted.  Neuro: A/Ox3. No focal deficits noted.   Skin: Warm, no lesions or rashe Psych: Normal affect and behavior. Judgement and thought content appropriate.     Lab Results:  CBC    Component Value Date/Time   WBC 7.2 03/22/2021 1242   RBC 4.26 03/22/2021 1242   HGB 11.5 (L) 03/22/2021 1242   HGB 11.7 11/30/2020 1049   HCT 36.7 03/22/2021 1242   HCT 35.7 11/30/2020 1049    PLT 366.0 03/22/2021 1242   PLT 257 11/30/2020 1049   MCV 86.3 03/22/2021 1242   MCV 90 11/30/2020 1049   MCH 26.1 03/07/2021 0541   MCHC 31.2 03/22/2021 1242   RDW 21.7 (H) 03/22/2021 1242   RDW 11.2 (L) 11/30/2020 1049   LYMPHSABS 2.3 03/22/2021 1242   LYMPHSABS 1.5 11/30/2020 1049   MONOABS 0.5 03/22/2021 1242   EOSABS 0.2 03/22/2021 1242   EOSABS 0.1 11/30/2020 1049   BASOSABS 0.2 (H) 03/22/2021 1242   BASOSABS 0.1 11/30/2020 1049    BMET    Component Value Date/Time   NA 140 03/22/2021 1242   NA 144 11/30/2020 1049   K 4.0 03/22/2021 1242   CL 101 03/22/2021 1242   CO2 32 03/22/2021 1242   GLUCOSE 81 03/22/2021 1242   BUN 16 03/22/2021 1242   BUN 9 11/30/2020 1049   CREATININE 0.73 03/22/2021 1242   CREATININE 0.73 06/30/2020 1054   CALCIUM 9.5 03/22/2021 1242   GFRNONAA >60 03/07/2021 0541   GFRAA 102 01/08/2020 1045    BNP    Component Value Date/Time   BNP 70.2 02/01/2021 2146     Imaging:  DG Chest 2 View  Result Date: 05/20/2021 CLINICAL DATA:  Cough EXAM: CHEST - 2 VIEW COMPARISON:  Chest radiograph dated March 22, 2021, CT examination dated April 23, 2021 FINDINGS: The heart size and mediastinal contours are within normal limits. Left basilar opacity which may represent atelectasis or infiltrate, not significantly changed. Bilateral lower lobe scarring/prominent interstitial markings, unchanged. Left clavicular plate and screw fixation. IMPRESSION: 1. Left lower lobe opacity which may represent atelectasis or infiltrate, unchanged. 2. 2. Scarring and reticulonodular opacities in bilateral lower lungs, which may represent bronchitis/pneumonitis in the settings of chronic fibrosis/interstitial lung disease. Electronically Signed   By: Keane Police D.O.   On: 05/20/2021 11:47      No flowsheet data found.  No results found for: NITRICOXIDE      Assessment & Plan:   Multifocal pneumonia Recent interval development of bilateral lower lobe  opacities. Initiated on prolonged abx course with augmentin. Slowly improving; residual productive cough and minimal SOB. Given previous abscess and recurrence of pna, will extend augmentin course for another week. Repeat CT chest for further evaluation upon completion. Advised to notify of any worsening symptoms.   Patient Instructions  -Continue albuterol inhaler 2 puffs every 4 hours as needed for shortness of breath or wheezing -Continue Flonase nasal spray 2 sprays each nostril daily -Continue mucinex 600 mg Twice daily  -Continue flutter valve 2-3 times per day  Extend augmentin for another week. Take 1 capsule  Twice daily for 7 days. Take with food.  Take daily probiotic 1 capsule daily in morning for 14 days.    Aspiration precautions discussed. Continue with diet as recommended by speech. Ensure supplements for protein deficiency.    Follow up in two weeks with Dr. Loanne Drilling. If symptoms do not improve or worsen, please contact office for sooner follow up or seek emergency care    Centrilobular emphysema ([place]) Evident on CT scan. Has not had formal PFTs. If SOB persists despite clearing of pna, will order PFTs for further evaluation.    I spent 35 minutes of dedicated to the care of this patient on the date of this encounter to include pre-visit review of records, face-to-face time with the patient discussing conditions above, post visit ordering of testing, clinical documentation with the electronic health record, making appropriate referrals as documented, and communicating necessary findings to members of the patients care team.  Clayton Bibles, NP 06/04/2021  Pt aware and understands NP's role.

## 2021-06-11 ENCOUNTER — Ambulatory Visit (HOSPITAL_BASED_OUTPATIENT_CLINIC_OR_DEPARTMENT_OTHER)
Admission: RE | Admit: 2021-06-11 | Discharge: 2021-06-11 | Disposition: A | Discharge status: Home / Self Care | Payer: Medicare Other | Source: Ambulatory Visit | Attending: Nurse Practitioner | Admitting: Nurse Practitioner

## 2021-06-11 ENCOUNTER — Other Ambulatory Visit: Payer: Self-pay

## 2021-06-11 DIAGNOSIS — J9809 Other diseases of bronchus, not elsewhere classified: Secondary | ICD-10-CM | POA: Diagnosis not present

## 2021-06-11 DIAGNOSIS — J189 Pneumonia, unspecified organism: Secondary | ICD-10-CM | POA: Insufficient documentation

## 2021-06-11 DIAGNOSIS — J439 Emphysema, unspecified: Secondary | ICD-10-CM | POA: Diagnosis not present

## 2021-06-11 DIAGNOSIS — R918 Other nonspecific abnormal finding of lung field: Secondary | ICD-10-CM | POA: Diagnosis not present

## 2021-06-11 IMAGING — CT CT CHEST W/O CM
2 of 4 series · 15 of 36 positions shown, 18 images · non-contrast
Comparison: Chest CT [DATE].

CLINICAL DATA: History of pneumonia and empyema. Cough. Follow-up
exam.



[Series 2: routine chest without · axial · non-contrast · 0.63mm/px · z∈[-272,-16]mm · 12 of 150 slices shown, 15 images]
[im 11/150  mediastinal]
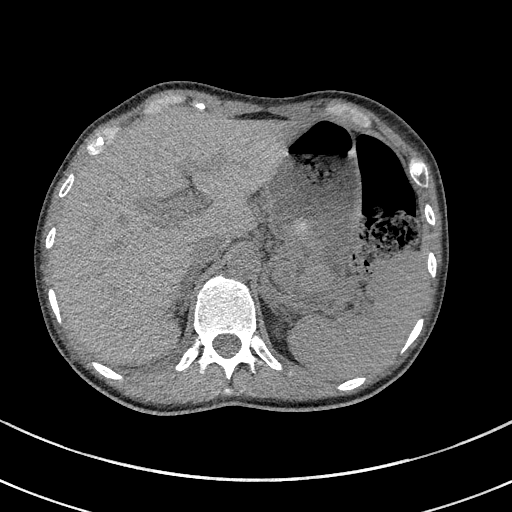
[im 11/150  lung]
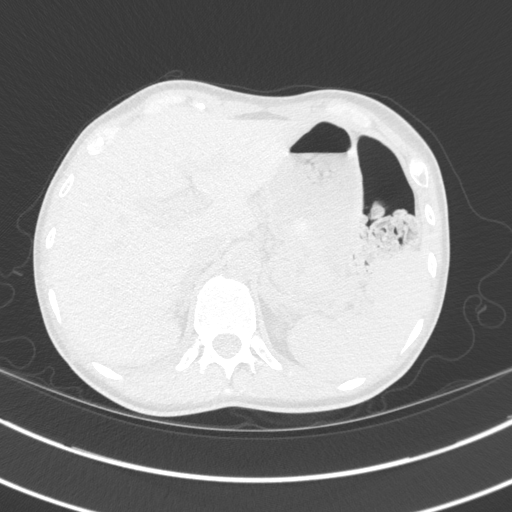
[im 22/150  lung]
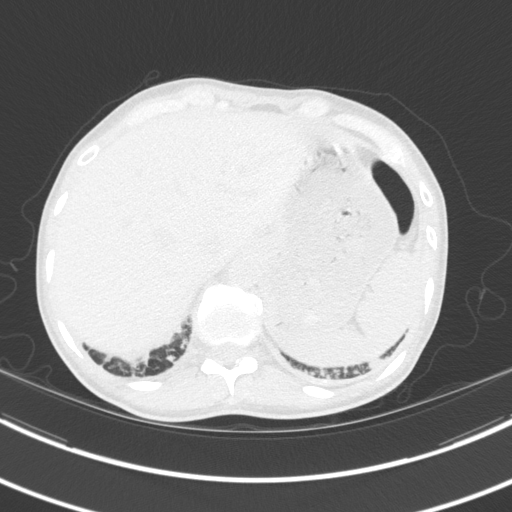
[im 32/150  lung]
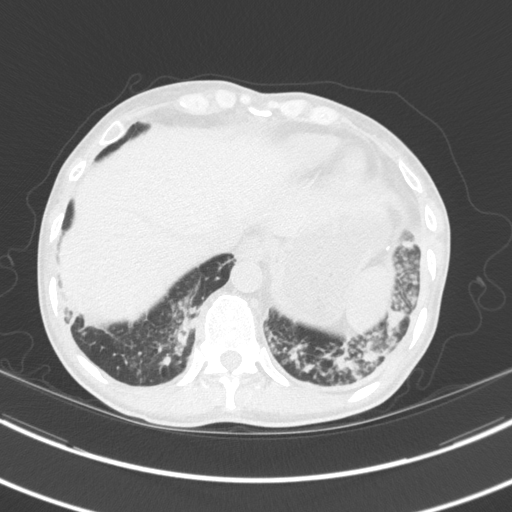
[im 43/150  lung]
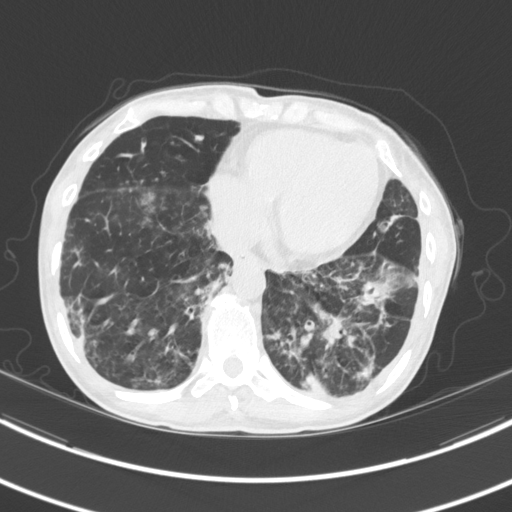
[im 54/150  mediastinal]
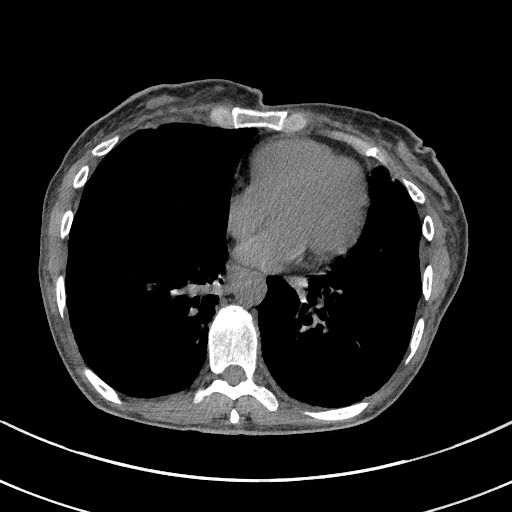
[im 54/150  lung]
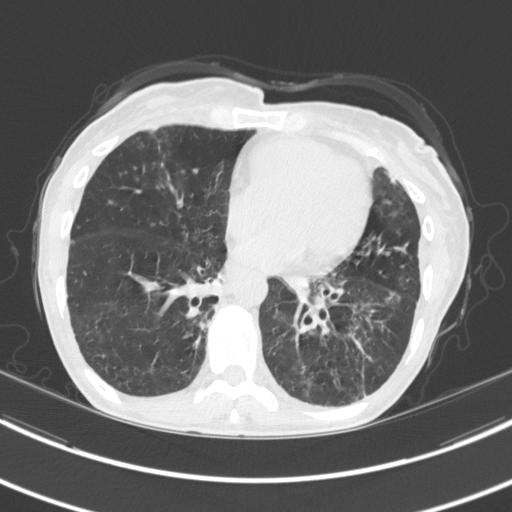
[im 64/150  lung]
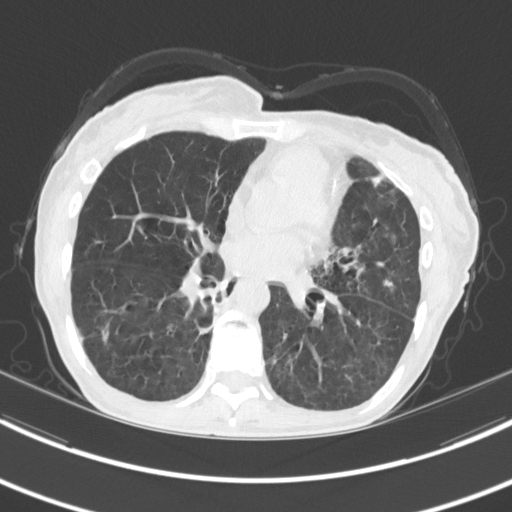
[im 86/150  lung]
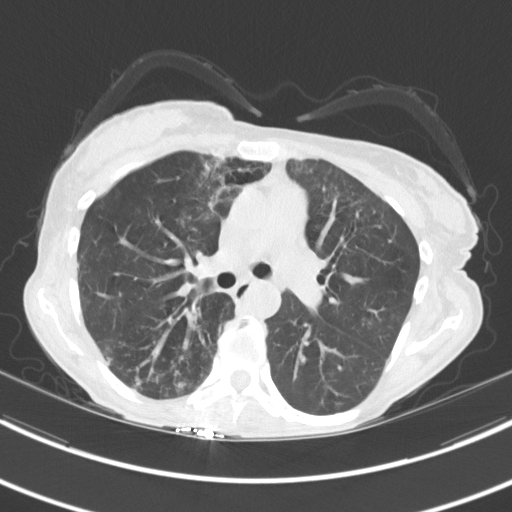
[im 96/150  lung]
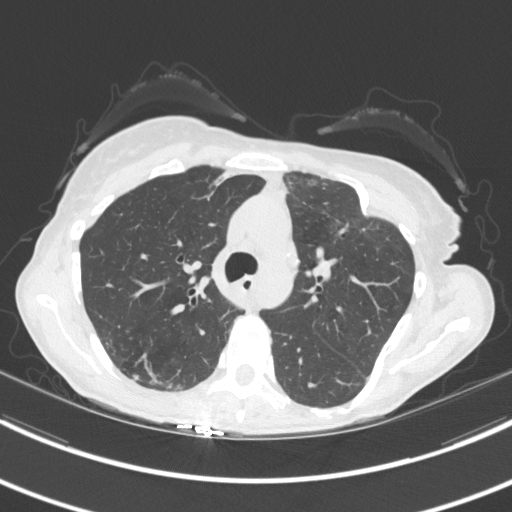
[im 107/150  mediastinal]
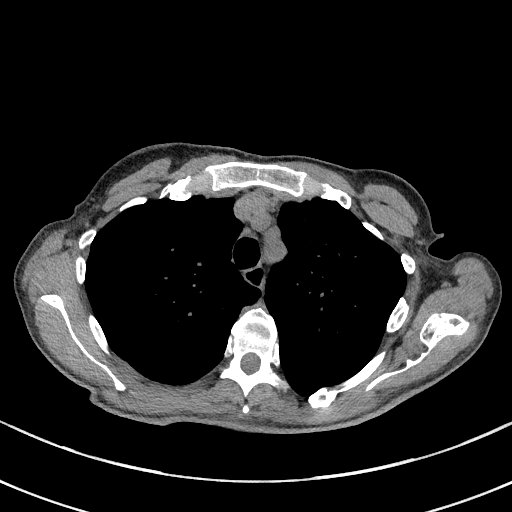
[im 107/150  lung]
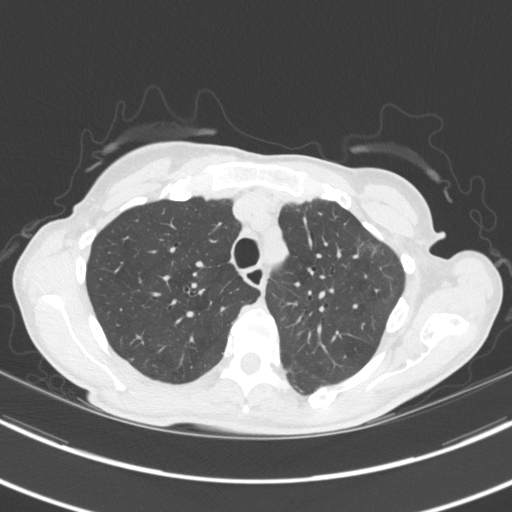
[im 118/150  lung]
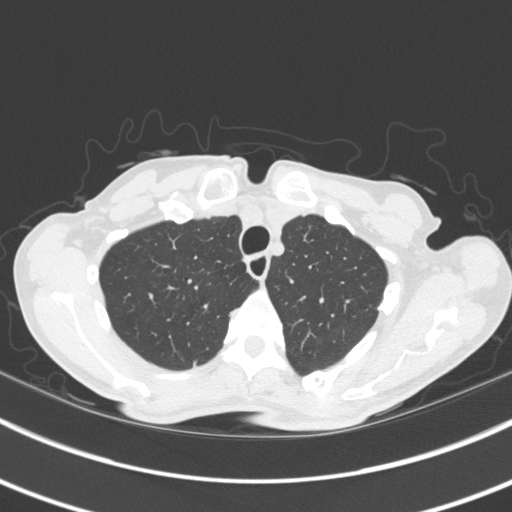
[im 128/150  lung]
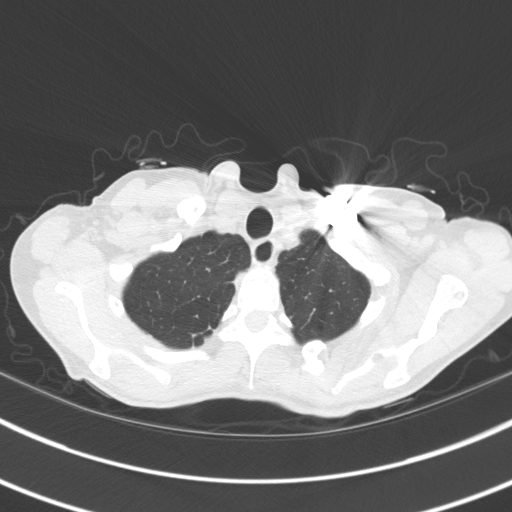
[im 139/150  lung]
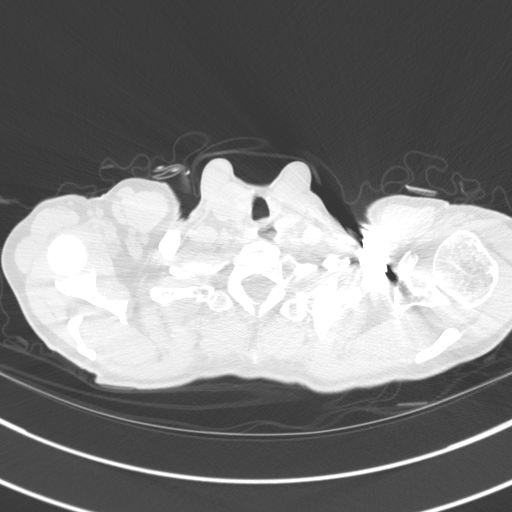

[Series 5: coronal · coronal · 0.64mm/px · 3 of 117 slices shown]
[im 24/117  lung]
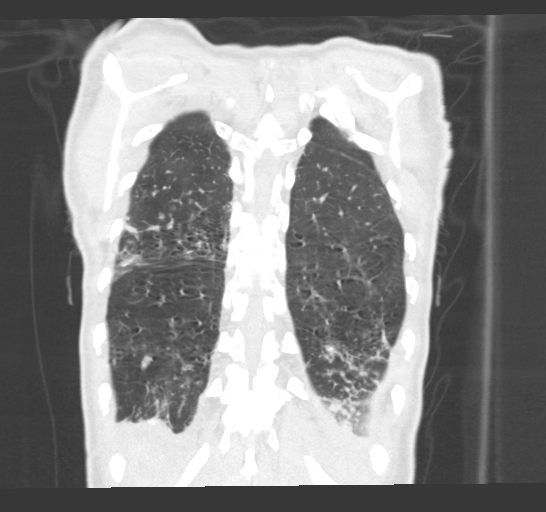
[im 47/117  lung]
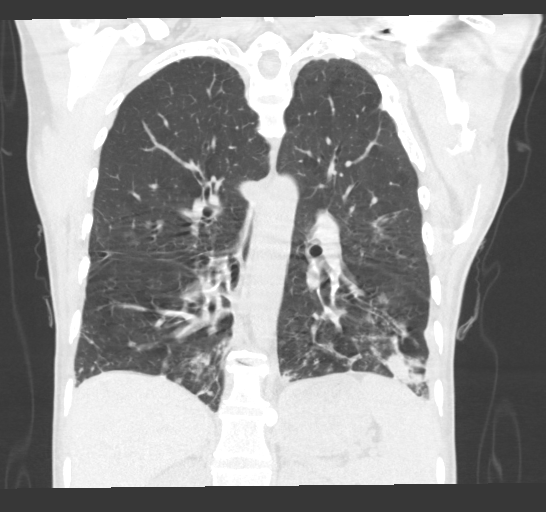
[im 70/117  lung]
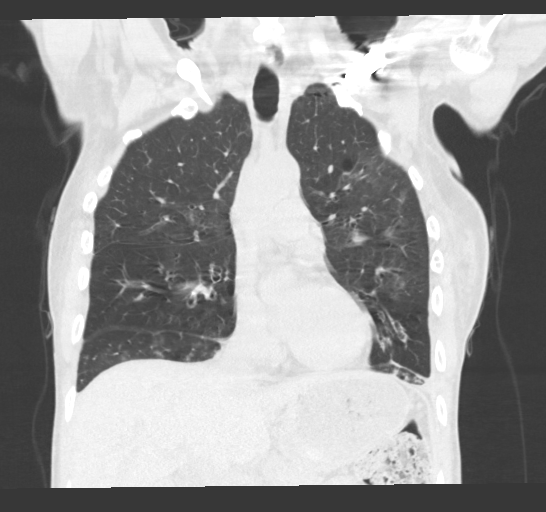

[15 of 36 positions shown; findings below may reference images not displayed]

FINDINGS: Cardiovascular: Coronary arterial and thoracic aortic vascular
calcifications. Normal heart size.

Mediastinum/Nodes: Normal appearance of the esophagus. No axillary,
mediastinal or hilar lymphadenopathy.

Lungs/Pleura: Redemonstrated bronchial wall thickening and mucous
plugging within the lower lobe bronchi. Interval improvement in
previously visualized large areas of consolidation within the right
upper lobe and left-greater-than-right lower lobes. There is however
residual fairly extensive patchy consolidative and tree-in-bud
ground-glass nodularity throughout the left-greater-than-right lower
lobes and posterior right upper lobe. Interval decrease in size of
trace bilateral pleural effusions. No pneumothorax. There are a few
new patchy areas of consolidation within the right lower lobe (image
18; series 5).

Upper Abdomen: No acute process.

Musculoskeletal: Thoracic spine degenerative changes. No aggressive
or acute appearing osseous lesions. Redemonstrated old deformities
of the left upper ribs.
IMPRESSION: 1. Extensive heterogeneous airspace opacities and patchy areas of
consolidation throughout the lungs bilaterally predominately within
the lower lobes/lung bases. Overall the findings appear mildly
improved when compared to prior exam however there are few new
additional areas of airspace opacity, predominately within the right
lower lobe. Findings concerning for multifocal infection and/or
aspiration.
2. Bronchial wall thickening compatible with infectious or
inflammatory bronchitis.
3. Improving small bilateral effusions.
4. Aortic Atherosclerosis ([IG]-[IG]) and Emphysema ([IG]-[IG]).

## 2021-06-17 ENCOUNTER — Other Ambulatory Visit: Payer: Medicaid Other

## 2021-06-22 ENCOUNTER — Encounter: Payer: Self-pay | Admitting: Pulmonary Disease

## 2021-06-22 ENCOUNTER — Other Ambulatory Visit: Payer: Self-pay

## 2021-06-22 ENCOUNTER — Ambulatory Visit (INDEPENDENT_AMBULATORY_CARE_PROVIDER_SITE_OTHER): Payer: Medicare Other | Admitting: Pulmonary Disease

## 2021-06-22 VITALS — BP 118/80 | HR 102 | Ht 63.0 in | Wt 106.0 lb

## 2021-06-22 DIAGNOSIS — J42 Unspecified chronic bronchitis: Secondary | ICD-10-CM

## 2021-06-22 MED ORDER — SPIRIVA RESPIMAT 1.25 MCG/ACT IN AERS: 2.0000 | INHALATION_SPRAY | Freq: Every day | RESPIRATORY_TRACT | 5 refills | Status: DC

## 2021-06-22 MED ORDER — LORATADINE 10 MG PO TABS: 10.0000 mg | ORAL_TABLET | Freq: Every day | ORAL | 3 refills | Status: DC

## 2021-06-22 MED ORDER — PREDNISONE 10 MG PO TABS: 20.0000 mg | ORAL_TABLET | Freq: Every day | ORAL | 0 refills | Status: AC

## 2021-06-22 NOTE — Patient Instructions (Addendum)
Pneumonia ?Completed antibiotics ? ?Chronic bronchitis ?--START Spiriva 1.25 mcg TWO puffs ONCE a day ?--CONTINUE Albuterol AS NEEDED for shortness of breath or wheezing ? ?Allergic Rhinitis ?--START loratadine 10 mg daily ? ?Follow-up with me in 3 months ? ?

## 2021-06-22 NOTE — Progress Notes (Signed)
? ?@Patient  ID: Margaret [name], female    DOB: 06-07-1970, 51 y.o.   MRN: [id number] ? ?Chief Complaint  ?Patient presents with  ? Follow-up  ?  Coughing green stuff  ? ? ?Referring provider: ?Dettinger, Fransisca Kaufmann, MD ? ?HPI: ?51 year old female never smoker with emphysema, hx childhood TB, schizophrenia, complex ovarian cyst, intellectual disability, protein calorie malnutrition, hepatitis B, orthostatic hypotension. who presents for follow-up. Her caregiver is present. ? ?Synopsis: ?She has had two recent hospitalizations related to multifocal pneumonia in setting of aspiration. First hospitalization complicated by empyema requiring chest tube in early November followed by readmission for pneumonia. Barium negative and was placed on regular diet. ? ?04/22/21 ?Since our last visit she reports feeling overall well. Denies fevers chills. No cough, wheezing or shortness of breath. However she is concerned about the site on right thorax where her prior chest tube was in early November that is tender to touch. She has not kept it dressed or protected.  ? ?05/20/21 ?She presents with her caregiver. Since our last visit she has completed her antibiotics for cellulitis and removes resolved pain at prior chest tube site. She has productive cough that started 2 days ago. Amount of sputum significant with copious amount of thick yellow phlegm. Denies fevers, chills. Denies wheezing or shortness of breath. ? ?06/22/21 ?Since our last visit, she has completed 3 weeks of antibiotics. However continues to have productive cough with green sputum. Denies shortness of breath. No fevers or chills. ? ?Review of Systems  ?Constitutional:  Negative for chills, diaphoresis, fever, malaise/fatigue and weight loss.  ?HENT:  Negative for congestion.   ?Respiratory:  Positive for cough and sputum production. Negative for hemoptysis, shortness of breath and wheezing.   ?Cardiovascular:  Negative for chest pain, palpitations, leg swelling and PND.   ? ?No Known Allergies ? ?Immunization History  ?Administered Date(s) Administered  ? Influenza,inj,Quad PF,6+ Mos 12/30/2015, 12/30/2016, 03/14/2018, 02/20/2019  ? Influenza-Unspecified 01/12/2015, 01/07/2020, 01/09/2021  ? Moderna Covid-19 Vaccine Bivalent Booster 65yr & up 02/15/2021  ? Moderna Sars-Covid-2 Vaccination 06/10/2019, 07/09/2019, 02/11/2020  ? ? ?Past Medical History:  ?Diagnosis Date  ? Hematuria 11/12/2014  ? Hepatitis [place] carrier ([place]   ? Hypothyroidism   ? Impulse disorder   ? IUD (intrauterine device) in place 11/12/2014  ? [facility] ? Mild intellectual disability   ? [place]   ? [place]ia [place]   ? Tuberculosis   ? as a child  ? Urinary frequency 11/12/2014  ? ? ?Tobacco History: ?Social History  ? ?Tobacco Use  ?Smoking Status Never  ?Smokeless Tobacco Never  ? ?Counseling given: Not Answered ? ? ?Outpatient Medications Prior to Visit  ?Medication Sig Dispense Refill  ? acetaminophen (TYLENOL) 650 MG CR tablet Take 650 mg by mouth every 8 (eight) hours as needed for pain.    ? albuterol (VENTOLIN HFA) 108 (90 Base) MCG/ACT inhaler INHALE 2 PUFFS EVERY 4 HOURS AS NEEDED FOR WHEEZING. 36 g 2  ? Amantadine HCl 100 MG tablet Take 100 mg by mouth every morning.    ? amoxicillin-clavulanate (AUGMENTIN) 875-125 MG tablet Take 1 tablet by mouth 2 (two) times daily. 14 tablet 0  ? Bacillus Coagulans-Inulin (PROBIOTIC) 1-250 BILLION-MG CAPS Take 1 capsule by mouth in the morning. 14 capsule 0  ? Cholecalciferol (VITAMIN D3) 50 MCG (2000 UT) capsule TAKE 1 CAPSULE BY MOUTH EVERY MORNING. 30 capsule 11  ? cloZAPine (CLOZARIL) 100 MG tablet Take 200 mg by mouth at bedtime.    ?  entecavir (BARACLUDE) 0.5 MG tablet TAKE (1) TABLET BY MOUTH ONCE DAILY. (Patient taking differently: Take 0.5 mg by mouth daily.) 30 tablet 11  ? Fish Oil-Cholecalciferol (FISH OIL + D3) 1200-1000 MG-UNIT CAPS TAKE 1 CAPSULE BY MOUTH TWICE DAILY. 60 capsule 11  ? FLUoxetine (PROZAC) 20 MG capsule TAKE 3  CAPSULES BY MOUTH ONCE DAILY. 90 capsule 11  ? fluticasone (FLONASE) 50 MCG/ACT nasal spray SPRAY 2 SPRAYS IN RIGHT NOSTRIL ONLY TWICE DAILY. 16 g 11  ? Gauze Pads & Dressings (STERILE GAUZE) 3"X3" PADS 2 packets by Does not apply route daily. 30 each 1  ? guaiFENesin (MUCINEX) 600 MG 12 hr tablet Take 1 tablet (600 mg total) by mouth 2 (two) times daily. 60 tablet 2  ? hydrOXYzine (ATARAX) 25 MG tablet Take 25 mg by mouth every 8 (eight) hours.    ? hydrOXYzine (VISTARIL) 25 MG capsule TAKE 1 CAPSULE BY MOUTH EVERY 8 HOURS AS NEEDED FOR ANXIETY 90 capsule 11  ? levonorgestrel (MIRENA) 20 MCG/24HR IUD 1 each by Intrauterine route once.    ? mirtazapine (REMERON) 15 MG tablet Take 15 mg by mouth at bedtime.    ? polyethylene glycol powder (GLYCOLAX/MIRALAX) 17 GM/SCOOP powder Take 17 g by mouth daily. (Patient taking differently: Take 17 g by mouth daily as needed.) 507 g 11  ? senna-docusate (SENOKOT-S) 8.6-50 MG tablet Take 1 tablet by mouth 2 (two) times daily between meals as needed for mild constipation. 180 tablet 0  ? Skin Protectants, Misc. (MINERIN CREME) CREA APPLY MODERATE AMOUNT TOPICALLY TO DRY SKIN ON SOLES & HEELS OF BOTH FEET AFTER SHOWER. (NURSING STAFF TO SUPERVISE APPLICATION) (Patient taking differently: 1 application. See admin instructions. Apply moderate amount topically to dry skin on soles and heels of both feet after shower) 454 g PRN  ? Transparent Dressings (TEGADERM FILM 6"X8") MISC 1 each by Does not apply route daily. 1 each 1  ? vitamin E 200 UNIT capsule Take 1 capsule (200 Units total) by mouth daily. 30 capsule 11  ? ?No facility-administered medications prior to visit.  ? ? ? ? ?Objective: ? ?BP 118/80 (BP Location: Left Arm, Cuff Size: Normal)   Pulse (!) 102   Ht 5' 3"  (1.6 m)   Wt 106 lb (48.1 kg)   SpO2 95%   BMI 18.78 kg/m?  ? ?Physical Exam: ?General: Well-appearing, no acute distress ?HENT: [place], AT ?Eyes: EOMI, no scleral icterus ?Respiratory: Clear to auscultation  bilaterally.  No crackles, wheezing or rales ?Cardiovascular: RRR, -M/R/G, no JVD ?Extremities:-Edema,-tenderness ?Neuro: AAO x4, CNII-XII grossly intact ?Psych: Normal mood, normal affect ? ?Lab Results: ? ?CBC ?   ?Component Value Date/Time  ? WBC 7.2 03/22/2021 1242  ? RBC 4.26 03/22/2021 1242  ? HGB 11.5 (L) 03/22/2021 1242  ? HGB 11.7 11/30/2020 1049  ? HCT 36.7 03/22/2021 1242  ? HCT 35.7 11/30/2020 1049  ? PLT 366.0 03/22/2021 1242  ? PLT 257 11/30/2020 1049  ? MCV 86.3 03/22/2021 1242  ? MCV 90 11/30/2020 1049  ? MCH 26.1 03/07/2021 0541  ? MCHC 31.2 03/22/2021 1242  ? RDW 21.7 (H) 03/22/2021 1242  ? RDW 11.2 (L) 11/30/2020 1049  ? LYMPHSABS 2.3 03/22/2021 1242  ? LYMPHSABS 1.5 11/30/2020 1049  ? MONOABS 0.5 03/22/2021 1242  ? EOSABS 0.2 03/22/2021 1242  ? EOSABS 0.1 11/30/2020 1049  ? BASOSABS 0.2 (H) 03/22/2021 1242  ? BASOSABS 0.1 11/30/2020 1049  ? ? ?BMET ?   ?Component Value Date/Time  ? NA  140 03/22/2021 1242  ? NA 144 11/30/2020 1049  ? K 4.0 03/22/2021 1242  ? CL 101 03/22/2021 1242  ? CO2 32 03/22/2021 1242  ? GLUCOSE 81 03/22/2021 1242  ? BUN 16 03/22/2021 1242  ? BUN 9 11/30/2020 1049  ? CREATININE 0.73 03/22/2021 1242  ? CREATININE 0.73 06/30/2020 1054  ? CALCIUM 9.5 03/22/2021 1242  ? GFRNONAA >60 03/07/2021 0541  ? GFRAA 102 01/08/2020 1045  ? ? ?BNP ?   ?Component Value Date/Time  ? BNP 70.2 02/01/2021 2146  ? ? ? ?Imaging: ?CT Chest 03/04/21 - Bilateral atelectasis and infiltrate, small bilateral pleural effusions ?CXR 03/22/22 - Bibasilar opacities, unchanged ?CT Chest 04/23/21 - Improved small bilateral pleural effusions with loculation. Overall improved bilateral airspace disease however interval RUL opacity ?CXR 05/20/21 - Opacities in bilateral lower lobes. LLL atelectasis. ?CT Chest 06/11/21 - Improved prior opacities with patchy consolidation bilaterally, improved small bilateral effusions ? ?CT Chest Wo Contrast ? ?Result Date: 06/12/2021 ?CLINICAL DATA:  History of pneumonia and empyema.  Cough. Follow-up exam. EXAM: CT CHEST WITHOUT CONTRAST TECHNIQUE: Multidetector CT imaging of the chest was performed following the standard protocol without IV contrast. RADIATION DOSE REDUCTION: This exam wa

## 2021-06-23 ENCOUNTER — Encounter: Payer: Self-pay | Admitting: Pulmonary Disease

## 2021-06-23 DIAGNOSIS — J42 Unspecified chronic bronchitis: Secondary | ICD-10-CM | POA: Insufficient documentation

## 2021-06-29 ENCOUNTER — Encounter: Payer: Self-pay | Admitting: Family Medicine

## 2021-07-02 ENCOUNTER — Ambulatory Visit (INDEPENDENT_AMBULATORY_CARE_PROVIDER_SITE_OTHER): Payer: Medicare Other | Admitting: Family Medicine

## 2021-07-02 ENCOUNTER — Encounter: Payer: Self-pay | Admitting: Family Medicine

## 2021-07-02 VITALS — BP 119/72 | HR 104 | Ht 63.0 in | Wt 106.2 lb

## 2021-07-02 DIAGNOSIS — N83299 Other ovarian cyst, unspecified side: Secondary | ICD-10-CM | POA: Diagnosis not present

## 2021-07-02 DIAGNOSIS — E782 Mixed hyperlipidemia: Secondary | ICD-10-CM

## 2021-07-02 DIAGNOSIS — F201 Disorganized schizophrenia: Secondary | ICD-10-CM

## 2021-07-02 DIAGNOSIS — J432 Centrilobular emphysema: Secondary | ICD-10-CM

## 2021-07-02 DIAGNOSIS — R918 Other nonspecific abnormal finding of lung field: Secondary | ICD-10-CM

## 2021-07-02 NOTE — Progress Notes (Signed)
? ?BP 119/72   Pulse (!) 104   Ht 5' 3"  (1.6 m)   Wt 106 lb 4 oz (48.2 kg)   SpO2 93%   BMI 18.82 kg/m?   ? ?Subjective:  ? ?[name], female    DOB: 1970-08-09, 51 y.o.   MRN: [id number] ? ?HPI: ?Margaret [name] is a 51 y.o. female presenting on 07/02/2021 for No chief complaint on file. ? ? ?HPI ?Hyperlipidemia ?Patient is coming in for recheck of his hyperlipidemia. The patient is currently taking fish oil. They deny any issues with myalgias or history of liver damage from it. They deny any focal numbness or weakness or chest pain.  ? ?Emphysema ?Patient sees pulmonology for emphysema and recovering from multifocal pneumonia per ? ?Patient sees gynecology for cyst looks like last MRI showed no cyst and that she is doing good. ? ?Patient sees psychiatry for Schizophrenia ? ?Relevant past medical, surgical, family and social history reviewed and updated as indicated. Interim medical history since our last visit reviewed. ?Allergies and medications reviewed and updated. ? ?Review of Systems  ?Constitutional:  Negative for chills and fever.  ?HENT:  Negative for congestion, ear discharge and ear pain.   ?Eyes:  Negative for redness and visual disturbance.  ?Respiratory:  Negative for chest tightness and shortness of breath.   ?Cardiovascular:  Negative for chest pain and leg swelling.  ?Genitourinary:  Negative for difficulty urinating and dysuria.  ?Musculoskeletal:  Negative for back pain and gait problem.  ?Skin:  Negative for rash.  ?Neurological:  Negative for dizziness, light-headedness and headaches.  ?Psychiatric/Behavioral:  Negative for agitation and behavioral problems.   ?All other systems reviewed and are negative. ? ?Per HPI unless specifically indicated above ? ? ?Allergies as of 07/02/2021   ?No Known Allergies ?  ? ?  ?Medication List  ?  ? ?  ? Accurate as of July 02, 2021  4:21 PM. If you have any questions, ask your nurse or doctor.  ?  ?  ? ?  ? ?acetaminophen 650 MG CR  tablet ?Commonly known as: TYLENOL ?Take 650 mg by mouth every 8 (eight) hours as needed for pain. ?  ?albuterol 108 (90 Base) MCG/ACT inhaler ?Commonly known as: Ventolin HFA ?INHALE 2 PUFFS EVERY 4 HOURS AS NEEDED FOR WHEEZING. ?  ?Amantadine HCl 100 MG tablet ?Take 100 mg by mouth every morning. ?  ?cloZAPine 100 MG tablet ?Commonly known as: CLOZARIL ?Take 200 mg by mouth at bedtime. ?  ?entecavir 0.5 MG tablet ?Commonly known as: BARACLUDE ?TAKE (1) TABLET BY MOUTH ONCE DAILY. ?What changed: See the new instructions. ?  ?Fish Oil + D3 1200-1000 MG-UNIT Caps ?TAKE 1 CAPSULE BY MOUTH TWICE DAILY. ?  ?FLUoxetine 20 MG capsule ?Commonly known as: PROZAC ?TAKE 3 CAPSULES BY MOUTH ONCE DAILY. ?  ?fluticasone 50 MCG/ACT nasal spray ?Commonly known as: FLONASE ?SPRAY 2 SPRAYS IN RIGHT NOSTRIL ONLY TWICE DAILY. ?  ?guaiFENesin 600 MG 12 hr tablet ?Commonly known as: Mucinex ?Take 1 tablet (600 mg total) by mouth 2 (two) times daily. ?  ?hydrOXYzine 25 MG capsule ?Commonly known as: VISTARIL ?TAKE 1 CAPSULE BY MOUTH EVERY 8 HOURS AS NEEDED FOR ANXIETY ?  ?hydrOXYzine 25 MG tablet ?Commonly known as: ATARAX ?Take 25 mg by mouth every 8 (eight) hours. ?  ?levonorgestrel 20 MCG/24HR IUD ?Commonly known as: MIRENA ?1 each by Intrauterine route once. ?  ?loratadine 10 MG tablet ?Commonly known as: Claritin ?Take 1 tablet (10 mg total)  by mouth daily. ?  ?Minerin Creme Crea ?APPLY MODERATE AMOUNT TOPICALLY TO DRY SKIN ON SOLES & HEELS OF BOTH FEET AFTER SHOWER. (NURSING STAFF TO SUPERVISE APPLICATION) ?What changed: See the new instructions. ?  ?mirtazapine 15 MG tablet ?Commonly known as: REMERON ?Take 15 mg by mouth at bedtime. ?  ?polyethylene glycol powder 17 GM/SCOOP powder ?Commonly known as: GLYCOLAX/MIRALAX ?Take 17 g by mouth daily. ?What changed:  ?when to take this ?reasons to take this ?  ?Probiotic 1-250 BILLION-MG Caps ?Take 1 capsule by mouth in the morning. ?  ?senna-docusate 8.6-50 MG tablet ?Commonly known  as: Senokot-S ?Take 1 tablet by mouth 2 (two) times daily between meals as needed for mild constipation. ?  ?Spiriva Respimat 1.25 MCG/ACT Aers ?Generic drug: Tiotropium Bromide Monohydrate ?Inhale 2 puffs into the lungs daily. ?  ?Sterile Gauze 3"X3" Pads ?2 packets by Does not apply route daily. ?  ?Tegaderm Film 6"x8" Misc ?1 each by Does not apply route daily. ?  ?Vitamin D3 50 MCG (2000 UT) capsule ?TAKE 1 CAPSULE BY MOUTH EVERY MORNING. ?  ?vitamin E 200 UNIT capsule ?Take 1 capsule (200 Units total) by mouth daily. ?  ? ?  ? ? ? ?Objective:  ? ?BP 119/72   Pulse (!) 104   Ht 5' 3"  (1.6 m)   Wt 106 lb 4 oz (48.2 kg)   SpO2 93%   BMI 18.82 kg/m?   ?Wt Readings from Last 3 Encounters:  ?07/02/21 106 lb 4 oz (48.2 kg)  ?06/22/21 106 lb (48.1 kg)  ?06/04/21 105 lb 3.2 oz (47.7 kg)  ?  ?Physical Exam ?Vitals and nursing note reviewed.  ?Constitutional:   ?   General: She is not in acute distress. ?   Appearance: She is well-developed. She is not diaphoretic.  ?Eyes:  ?   Conjunctiva/sclera: Conjunctivae normal.  ?   Pupils: Pupils are equal, round, and reactive to light.  ?Cardiovascular:  ?   Rate and Rhythm: Normal rate and regular rhythm.  ?   Heart sounds: Normal heart sounds. No murmur heard. ?Pulmonary:  ?   Effort: Pulmonary effort is normal. No respiratory distress.  ?   Breath sounds: Normal breath sounds. No wheezing.  ?Musculoskeletal:     ?   General: No tenderness. Normal range of motion.  ?Skin: ?   General: Skin is warm and dry.  ?   Findings: No rash.  ?Neurological:  ?   Mental Status: She is alert and oriented to person, place, and time.  ?   Coordination: Coordination normal.  ?Psychiatric:     ?   Behavior: Behavior normal.  ? ? ? ? ?Assessment & Plan:  ? ?Problem List Items Addressed This Visit   ? ?  ? Respiratory  ? Multiple pulmonary nodules  ? Relevant Orders  ? CBC with Differential/Platelet  ? Centrilobular emphysema ([place])  ? Relevant Orders  ? CBC with Differential/Platelet  ?  ?  Endocrine  ? Complex ovarian cyst  ?  ? Other  ? Hyperlipidemia - Primary  ? Relevant Orders  ? CBC with Differential/Platelet  ? CMP14+EGFR  ? Lipid panel  ? Schizophrenia ([place])  ?  ?Continue current medicine ? ? ?Follow up plan: ?Return in about 6 months (around 01/01/2022), or if symptoms worsen or fail to improve, for Hyperlipidemia recheck. ? ?Counseling provided for all of the vaccine components ?Orders Placed This Encounter  ?Procedures  ? CBC with Differential/Platelet  ? CMP14+EGFR  ? Lipid panel  ? ? ?  Caryl Pina, MD ?[place] ?07/02/2021, 4:21 PM ? ? ? ? ?

## 2021-07-03 LAB — CMP14+EGFR
ALT: 11 IU/L (ref 0–32)
AST: 8 IU/L (ref 0–40)
Albumin/Globulin Ratio: 1.3 (ref 1.2–2.2)
Albumin: 3.7 g/dL — ABNORMAL LOW (ref 3.8–4.8)
Alkaline Phosphatase: 110 IU/L (ref 44–121)
BUN/Creatinine Ratio: 27 — ABNORMAL HIGH (ref 9–23)
BUN: 15 mg/dL (ref 6–24)
Bilirubin Total: <0.2 mg/dL (ref 0.0–1.2)
CO2: 22 mmol/L (ref 20–29)
Calcium: 8.7 mg/dL (ref 8.7–10.2)
Chloride: 103 mmol/L (ref 96–106)
Creatinine, Ser: 0.56 mg/dL — ABNORMAL LOW (ref 0.57–1.00)
Globulin, Total: 2.8 g/dL (ref 1.5–4.5)
Glucose: 96 mg/dL (ref 70–99)
Potassium: 4 mmol/L (ref 3.5–5.2)
Sodium: 140 mmol/L (ref 134–144)
Total Protein: 6.5 g/dL (ref 6.0–8.5)
eGFR: 112 mL/min/{1.73_m2} (ref >59)

## 2021-07-03 LAB — CBC WITH DIFFERENTIAL/PLATELET
Basophils Absolute: 0.1 10*3/uL (ref 0.0–0.2)
Basos: 1 %
EOS (ABSOLUTE): 0.2 10*3/uL (ref 0.0–0.4)
Eos: 1 %
Hematocrit: 32.9 % — ABNORMAL LOW (ref 34.0–46.6)
Hemoglobin: 10.8 g/dL — ABNORMAL LOW (ref 11.1–15.9)
Immature Grans (Abs): 0.1 10*3/uL (ref 0.0–0.1)
Immature Granulocytes: 1 %
Lymphocytes Absolute: 2.8 10*3/uL (ref 0.7–3.1)
Lymphs: 15 %
MCH: 27.1 pg (ref 26.6–33.0)
MCHC: 32.8 g/dL (ref 31.5–35.7)
MCV: 83 fL (ref 79–97)
Monocytes Absolute: 1 10*3/uL — ABNORMAL HIGH (ref 0.1–0.9)
Monocytes: 5 %
Neutrophils Absolute: 14.2 10*3/uL — ABNORMAL HIGH (ref 1.4–7.0)
Neutrophils: 77 %
Platelets: 392 10*3/uL (ref 150–450)
RBC: 3.99 x10E6/uL (ref 3.77–5.28)
RDW: 14.2 % (ref 11.7–15.4)
WBC: 18.4 10*3/uL — ABNORMAL HIGH (ref 3.4–10.8)

## 2021-07-03 LAB — LIPID PANEL
Chol/HDL Ratio: 2.5 ratio (ref 0.0–4.4)
Cholesterol, Total: 124 mg/dL (ref 100–199)
HDL: 49 mg/dL (ref >39)
LDL Chol Calc (NIH): 58 mg/dL (ref 0–99)
Triglycerides: 87 mg/dL (ref 0–149)
VLDL Cholesterol Cal: 17 mg/dL (ref 5–40)

## 2021-07-06 ENCOUNTER — Ambulatory Visit (INDEPENDENT_AMBULATORY_CARE_PROVIDER_SITE_OTHER): Payer: Medicare Other | Admitting: Internal Medicine

## 2021-07-06 ENCOUNTER — Other Ambulatory Visit: Payer: Self-pay | Admitting: Family Medicine

## 2021-07-06 ENCOUNTER — Encounter (INDEPENDENT_AMBULATORY_CARE_PROVIDER_SITE_OTHER): Payer: Self-pay | Admitting: Internal Medicine

## 2021-07-06 VITALS — BP 107/64 | HR 103 | Temp 100.0°F | Ht 63.0 in | Wt 102.3 lb

## 2021-07-06 DIAGNOSIS — B181 Chronic viral hepatitis B without delta-agent: Secondary | ICD-10-CM

## 2021-07-06 DIAGNOSIS — K5900 Constipation, unspecified: Secondary | ICD-10-CM | POA: Diagnosis not present

## 2021-07-06 DIAGNOSIS — R634 Abnormal weight loss: Secondary | ICD-10-CM | POA: Diagnosis not present

## 2021-07-06 MED ORDER — POLYETHYLENE GLYCOL 3350 17 GM/SCOOP PO POWD: 8.5000 g | Freq: Every day | ORAL | 11 refills | Status: DC

## 2021-07-06 NOTE — Progress Notes (Signed)
Presenting complaint; ? ?Follow-up for chronic hepatitis B. ? ?Database and subjective: ? ?Patient is 51 year old female who is here for scheduled visit accompanied by Ms. [place] Wilson who manages the facility where patient states.  She was last seen on 01/05/2021.  On that visit she was noted to have mass in right lower quadrant of her abdomen which turned out to be pseudomass due to constipation on CT abdomen and pelvis which revealed 3.1 cm cystic lesion in right adnexal region.  On follow-up studies no cystic mass was found. ?She was hospitalized on 01/29/2021 pneumonia and right empyema requiring chest tube placement.  She was discharged on 02/18/2021 and readmitted 2 weeks later with multifocal pneumonia and sepsis and once again treated with antibiotics and improved.  Chest CT in December 2022 revealed multiple pulmonary nodules felt not to be due to metastatic disease and she is being followed by Dr. Delton Coombes. ? ?Patient has no complaints.  She has lost 4 pounds since her last visit.  According to Ms. [place] Pulling she eats 3 meals a day along with a snack.  She used to walk all the time but not anymore.  She is using polyethylene glycol and Senokot on as-needed basis.  She denies nausea vomiting heartburn or dysphagia.  No melena or rectal bleeding.  She has not had a Cologuard testing as recommended. ? ?Current Medications: ?Outpatient Encounter Medications as of 07/06/2021  ?Medication Sig  ? acetaminophen (TYLENOL) 650 MG CR tablet Take 650 mg by mouth every 8 (eight) hours as needed for pain.  ? albuterol (VENTOLIN HFA) 108 (90 Base) MCG/ACT inhaler INHALE 2 PUFFS EVERY 4 HOURS AS NEEDED FOR WHEEZING.  ? Amantadine HCl 100 MG tablet Take 100 mg by mouth every morning.  ? Bacillus Coagulans-Inulin (PROBIOTIC) 1-250 BILLION-MG CAPS Take 1 capsule by mouth in the morning.  ? Cholecalciferol (VITAMIN D3) 50 MCG (2000 UT) capsule TAKE 1 CAPSULE BY MOUTH EVERY MORNING.  ? cloZAPine (CLOZARIL) 100 MG tablet Take  200 mg by mouth at bedtime.  ? entecavir (BARACLUDE) 0.5 MG tablet TAKE (1) TABLET BY MOUTH ONCE DAILY. (Patient taking differently: Take 0.5 mg by mouth daily.)  ? Fish Oil-Cholecalciferol (FISH OIL + D3) 1200-1000 MG-UNIT CAPS TAKE 1 CAPSULE BY MOUTH TWICE DAILY.  ? FLUoxetine (PROZAC) 20 MG capsule TAKE 3 CAPSULES BY MOUTH ONCE DAILY.  ? fluticasone (FLONASE) 50 MCG/ACT nasal spray SPRAY 2 SPRAYS IN RIGHT NOSTRIL ONLY TWICE DAILY.  ? Gauze Pads & Dressings (STERILE GAUZE) 3"X3" PADS 2 packets by Does not apply route daily.  ? guaiFENesin (MUCINEX) 600 MG 12 hr tablet Take 1 tablet (600 mg total) by mouth 2 (two) times daily.  ? hydrOXYzine (VISTARIL) 25 MG capsule TAKE 1 CAPSULE BY MOUTH EVERY 8 HOURS AS NEEDED FOR ANXIETY  ? levonorgestrel (MIRENA) 20 MCG/24HR IUD 1 each by Intrauterine route once.  ? loratadine (CLARITIN) 10 MG tablet Take 1 tablet (10 mg total) by mouth daily.  ? mirtazapine (REMERON) 15 MG tablet Take 15 mg by mouth at bedtime.  ? polyethylene glycol powder (GLYCOLAX/MIRALAX) 17 GM/SCOOP powder Take 17 g by mouth daily. (Patient taking differently: Take 17 g by mouth daily as needed.)  ? senna-docusate (SENOKOT-S) 8.6-50 MG tablet Take 1 tablet by mouth 2 (two) times daily between meals as needed for mild constipation.  ? Skin Protectants, Misc. (MINERIN CREME) CREA APPLY MODERATE AMOUNT TOPICALLY TO DRY SKIN ON SOLES & HEELS OF BOTH FEET AFTER SHOWER. (NURSING STAFF TO SUPERVISE APPLICATION) (Patient taking differently:  1 application. See admin instructions. Apply moderate amount topically to dry skin on soles and heels of both feet after shower)  ? Tiotropium Bromide Monohydrate (SPIRIVA RESPIMAT) 1.25 MCG/ACT AERS Inhale 2 puffs into the lungs daily.  ? Transparent Dressings (TEGADERM FILM 6"X8") MISC 1 each by Does not apply route daily.  ? vitamin E 200 UNIT capsule Take 1 capsule (200 Units total) by mouth daily.  ? [DISCONTINUED] hydrOXYzine (ATARAX) 25 MG tablet Take 25 mg by mouth  every 8 (eight) hours.  ? ?No facility-administered encounter medications on file as of 07/06/2021.  ? ? ? ?Objective: ?Blood pressure 107/64, pulse (!) 103, temperature 100 ?F (37.8 ?C), temperature source Oral, height _0  (1.6 m), weight 102 lb 4.8 oz (46.4 kg). ?Patient is alert and in no acute distress. ?Conjunctiva is pink. Sclera is nonicteric ?Oropharyngeal mucosa is normal. ?No neck masses or thyromegaly noted. ?Cardiac exam with regular rhythm normal S1 and S2. No murmur or gallop noted. ?Lungs are clear to auscultation. ?Abdomen is flat soft and nontender with organomegaly or masses. ?No LE edema or clubbing noted. ? ?Labs/studies Results: ? ? ? ?  Latest Ref Rng & Units 07/02/2021  ?  4:29 PM 03/22/2021  ? 12:42 PM 03/07/2021  ?  5:41 AM  ?CBC  ?WBC 3.4 - 10.8 x10E3/uL 18.4   7.2   15.1    ?Hemoglobin 11.1 - 15.9 g/dL 10.8   11.5   8.6    ?Hematocrit 34.0 - 46.6 % 32.9   36.7   28.0    ?Platelets 150 - 450 x10E3/uL 392   366.0   335    ?  ? ?  Latest Ref Rng & Units 07/02/2021  ?  4:29 PM 03/22/2021  ? 12:42 PM 03/07/2021  ?  5:41 AM  ?CMP  ?Glucose 70 - 99 mg/dL 96   81   84    ?BUN 6 - 24 mg/dL _1 ?Creatinine 0.57 - 1.00 mg/dL 0.56   0.73   0.47    ?Sodium 134 - 144 mmol/L 140   140   136    ?Potassium 3.5 - 5.2 mmol/L 4.0   4.0   4.5    ?Chloride 96 - 106 mmol/L 103   101   105    ?CO2 20 - 29 mmol/L 22   32   28    ?Calcium 8.7 - 10.2 mg/dL 8.7   9.5   8.8    ?Total Protein 6.0 - 8.5 g/dL 6.5      ?Total Bilirubin 0.0 - 1.2 mg/dL <0.2      ?Alkaline Phos 44 - 121 IU/L 110      ?AST 0 - 40 IU/L 8      ?ALT 0 - 32 IU/L 11      ?  ? ?  Latest Ref Rng & Units 07/02/2021  ?  4:29 PM 03/03/2021  ?  2:35 PM 02/18/2021  ?  1:29 AM  ?Hepatic Function  ?Total Protein 6.0 - 8.5 g/dL 6.5   6.4     ?Albumin 3.8 - 4.8 g/dL 3.7   2.7   2.3    ?AST 0 - 40 IU/L 8   17     ?ALT 0 - 32 IU/L 11   16     ?Alk Phosphatase 44 - 121 IU/L 110   87     ?Total Bilirubin 0.0 - 1.2 mg/dL <0.2  0.5     ?   ? ? ?Assessment: ? ?#1.  Chronic hepatitis B.  Elastography in 2016 revealed F2/F3 disease but liver biopsy in December 2016 revealed chronic B hepatitis with fibrosis around central veins.  She has been on Entecavir since January 2017.  Ultrasound in February 21 revealed no abnormalities to liver.  CT abdomen and pelvis in October 2022 revealed no abnormality to liver. ?Patient did not have viral studies following her last visit because of acute illness.   ?If she seroconverts i.e. hepatitis B surface antigen is negative antiviral therapy will be stopped after 1 year otherwise may consider continuing DNA polymerase inhibitor. ? ?#2.  Weight loss.  Recent weight loss would appear to be due to complicated illness with pneumonia requiring 2 hospitalizations. ? ?#3.  Constipation.  This was discovered back in October 2022.  She is doing well with therapy. ? ? ?Plan: ? ?Continue Entecavir at current dose of 0.5 mg by mouth daily. ?Patient will go to the lab for hepatitis B surface antigen and HBV DNA by PCR quantitative. ?Office visit in 6 months. ? ? ? ? ?

## 2021-07-06 NOTE — Patient Instructions (Signed)
Physician will call with results of blood test when completed. ?

## 2021-07-08 ENCOUNTER — Encounter: Payer: Self-pay | Admitting: Family Medicine

## 2021-07-08 ENCOUNTER — Ambulatory Visit (INDEPENDENT_AMBULATORY_CARE_PROVIDER_SITE_OTHER): Payer: Medicare Other | Admitting: Family Medicine

## 2021-07-08 ENCOUNTER — Ambulatory Visit (INDEPENDENT_AMBULATORY_CARE_PROVIDER_SITE_OTHER): Payer: Medicare Other

## 2021-07-08 VITALS — BP 91/59 | HR 107 | Temp 97.0°F | Ht 63.0 in | Wt 102.0 lb

## 2021-07-08 DIAGNOSIS — R1030 Lower abdominal pain, unspecified: Secondary | ICD-10-CM

## 2021-07-08 DIAGNOSIS — R109 Unspecified abdominal pain: Secondary | ICD-10-CM | POA: Diagnosis not present

## 2021-07-08 DIAGNOSIS — R350 Frequency of micturition: Secondary | ICD-10-CM | POA: Diagnosis not present

## 2021-07-08 DIAGNOSIS — K5900 Constipation, unspecified: Secondary | ICD-10-CM | POA: Diagnosis not present

## 2021-07-08 DIAGNOSIS — K5909 Other constipation: Secondary | ICD-10-CM | POA: Diagnosis not present

## 2021-07-08 LAB — URINALYSIS, ROUTINE W REFLEX MICROSCOPIC
Bilirubin, UA: NEGATIVE
Glucose, UA: NEGATIVE
Nitrite, UA: NEGATIVE
RBC, UA: NEGATIVE
Specific Gravity, UA: >1.03 — ABNORMAL HIGH (ref 1.005–1.030)
Urobilinogen, Ur: 1 mg/dL (ref 0.2–1.0)
pH, UA: 5.5 (ref 5.0–7.5)

## 2021-07-08 LAB — MICROSCOPIC EXAMINATION
Bacteria, UA: NONE SEEN
RBC, Urine: NONE SEEN /hpf (ref 0–2)
Renal Epithel, UA: NONE SEEN /hpf

## 2021-07-08 IMAGING — DX DG ABDOMEN 1V
2 series · 2 of 2 positions shown · non-contrast
Comparison: None.

CLINICAL DATA: Abdominal pain

EXAM:
ABDOMEN - 1 VIEW

[abdomen kub (1 of 2)]
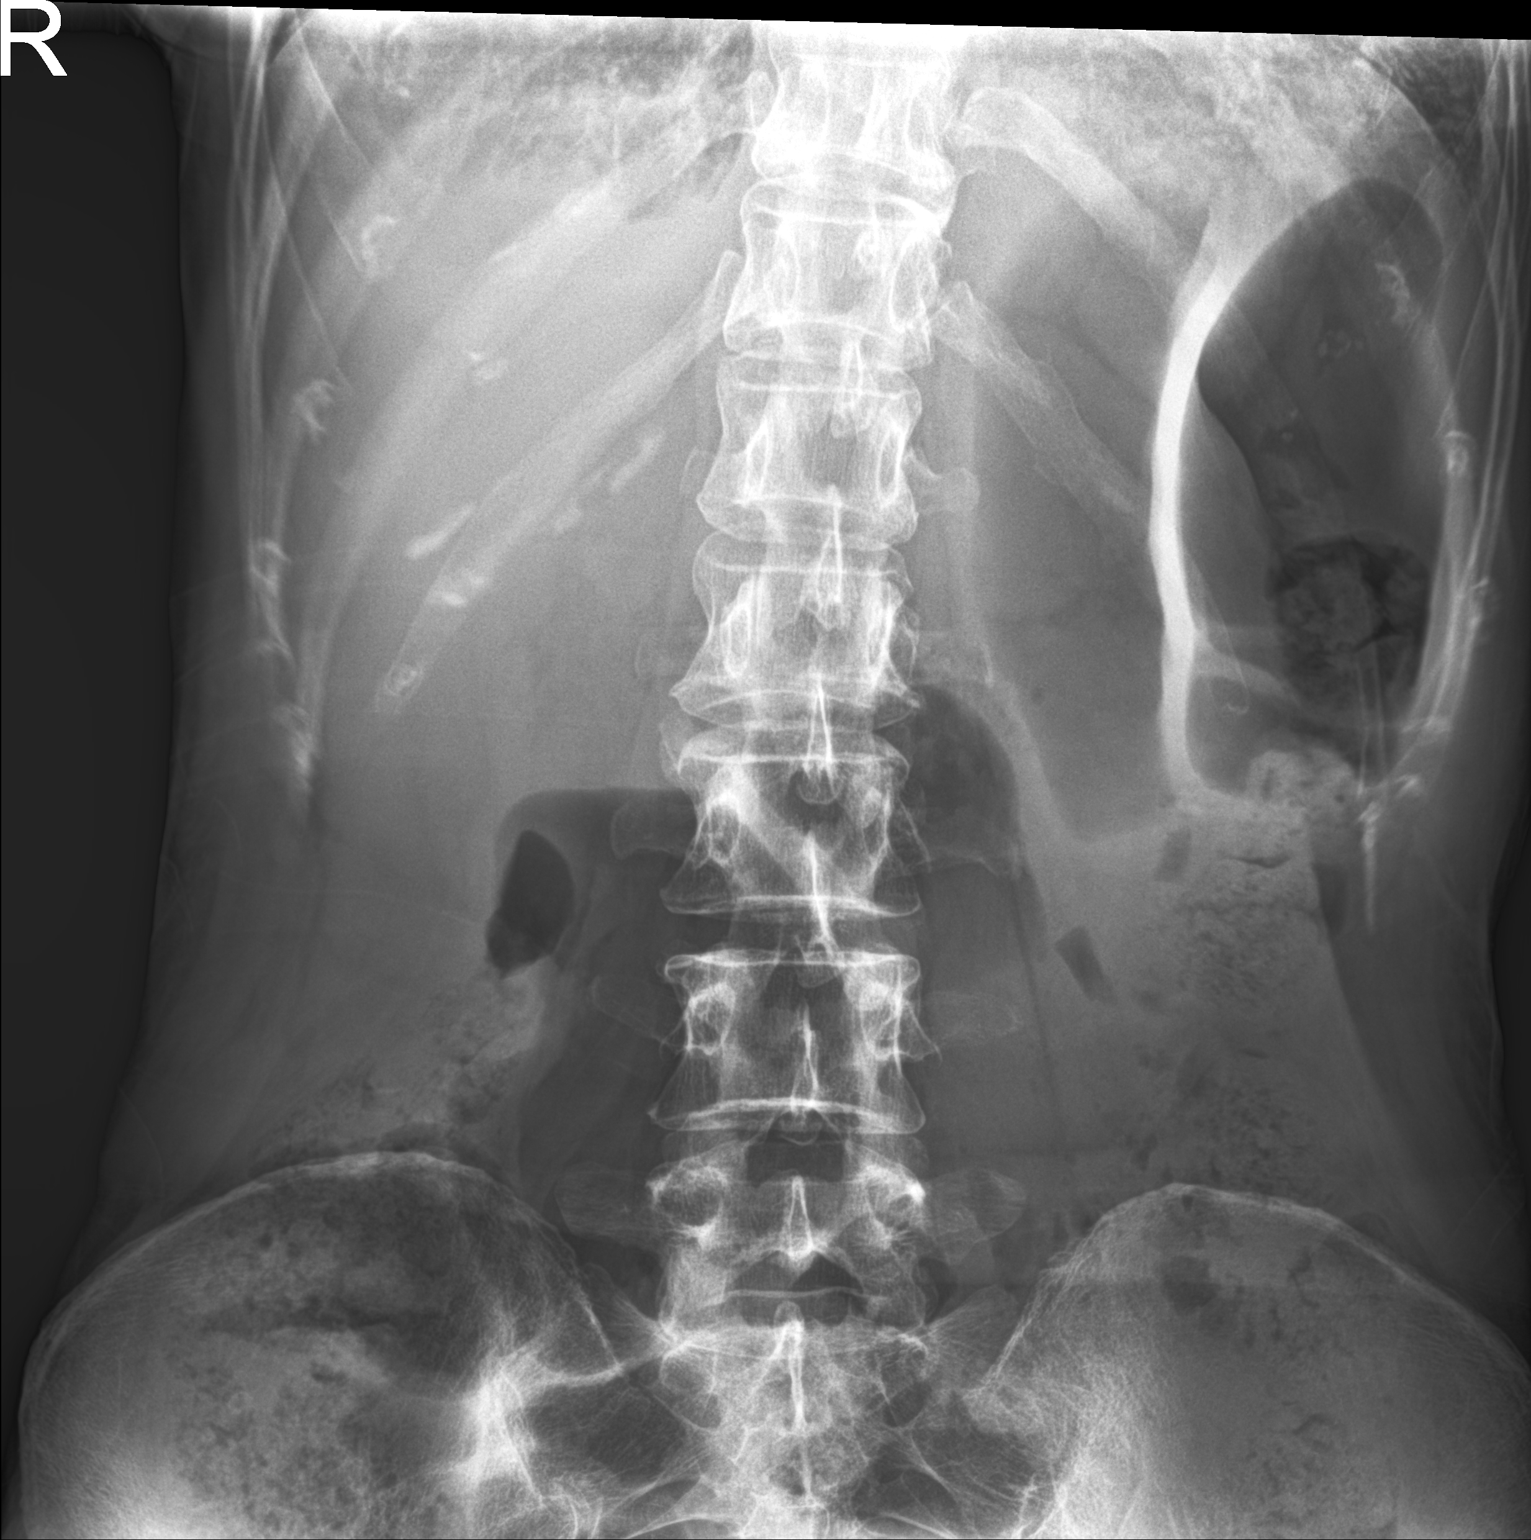

[abdomen kub (2 of 2)]
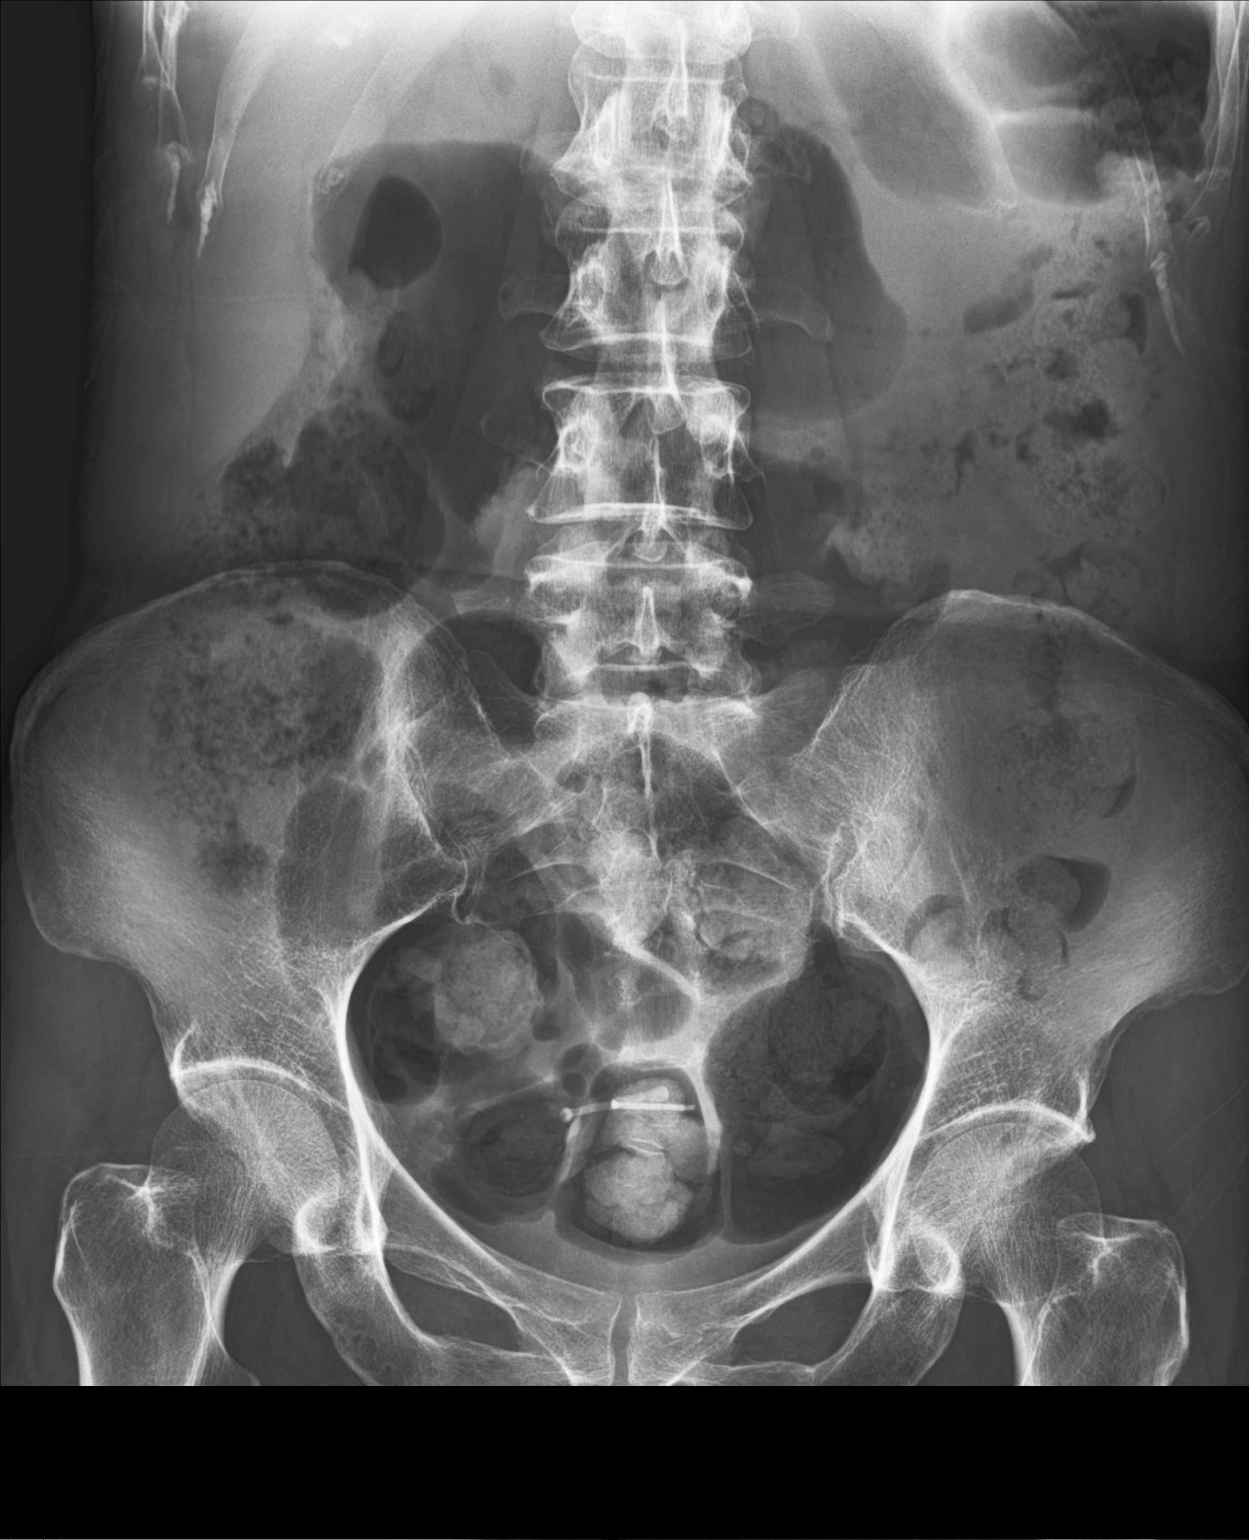

[2 of 2 positions shown; findings below may reference images not displayed]

FINDINGS: Scattered large and small bowel gas is noted. Fecal material is
noted within the colon consistent with a mild degree of
constipation. IUD is noted in place. No free air is seen. No acute
bony abnormality is noted.
IMPRESSION: Mild colonic constipation.

## 2021-07-08 MED ORDER — POLYETHYLENE GLYCOL 3350 17 GM/SCOOP PO POWD: 17.0000 g | Freq: Two times a day (BID) | ORAL | 1 refills | Status: DC | PRN

## 2021-07-08 MED ORDER — ENEMA READY-TO-USE 7-19 GM/118ML RE ENEM: 1.0000 | ENEMA | Freq: Once | RECTAL | 0 refills | Status: AC

## 2021-07-08 NOTE — Progress Notes (Signed)
?  ? ?Subjective:  ?[name], female    DOB: 1970-08-05, 51 y.o.   MRN: [id number] ? ?Patient Care Team: ?Dettinger, Fransisca Kaufmann, MD as PCP - General (Family Medicine) ?Rogene Houston, MD as Consulting Physician (Gastroenterology) ?Derek Jack, MD as Medical Oncologist (Medical Oncology) ?Brien Mates, RN as Oncology Nurse Navigator (Oncology)  ? ?Chief Complaint:  Abdominal Pain ? ? ?HPI: ?Margaret [name] is a 51 y.o. female presenting on 07/08/2021 for Abdominal Pain ? ? ?Pt presents today with caregiver with complaints of lower abdominal pain and urinary frequency. She is a resident at the [facility]. Caregiver states she has not been eating as much over the last few days. Denies fever, chills, nausea, or vomiting. ? ? ?Relevant past medical, surgical, family, and social history reviewed and updated as indicated.  ?Allergies and medications reviewed and updated. Data reviewed: Chart in Epic. ? ? ?Past Medical History:  ?Diagnosis Date  ? Hematuria 11/12/2014  ? Hepatitis B carrier ([place])   ? Hypothyroidism   ? Impulse disorder   ? IUD (intrauterine device) in place 11/12/2014  ? Inserted 12/[facility]/12 at [facility]  ? Mild intellectual disability   ? Schizophrenia ([place])   ? Schizophrenia ([place])   ? Tuberculosis   ? as a child  ? Urinary frequency 11/12/2014  ? ? ?Past Surgical History:  ?Procedure Laterality Date  ? ARM WOUND REPAIR / CLOSURE    ? TRACHEAL SURGERY    ? TRACHEOSTOMY    ? ? ?Social History  ? ?Socioeconomic History  ? Marital status: Single  ?  Spouse name: Not on file  ? Number of children: 0  ? Years of education: 92  ? Highest education level: High school graduate  ?Occupational History  ? Occupation: disabled  ?  Comment: due to schizophrenia  ?Tobacco Use  ? Smoking status: Never  ?  Passive exposure: Never  ? Smokeless tobacco: Never  ?Vaping Use  ? Vaping Use: Never used  ?Substance and Sexual Activity  ? Alcohol use: No  ?  Alcohol/week: 0.0 standard drinks  ? Drug use: No  ?  Sexual activity: Not Currently  ?  Birth control/protection: I.U.D.  ?Other Topics Concern  ? Not on file  ?Social History Narrative  ? Patient was adopted. She does not have any children and has never been married. She lives in a group home and is disabled. Has a diagnosis of schizophrenia. She socially isolates herself, doesn't like to participate in group activities, sleeps a lot, has to be prompted to bathe, eat, dress, etc. -07/22/20  ? ?Social Determinants of Health  ? ?Financial Resource Strain: Low Risk   ? Difficulty of Paying Living Expenses: Not hard at all  ?Food Insecurity: No Food Insecurity  ? Worried About Charity fundraiser in the Last Year: Never true  ? Ran Out of Food in the Last Year: Never true  ?Transportation Needs: No Transportation Needs  ? Lack of Transportation (Medical): No  ? Lack of Transportation (Non-Medical): No  ?Physical Activity: Insufficiently Active  ? Days of Exercise per Week: 1 day  ? Minutes of Exercise per Session: 20 min  ?Stress: Stress Concern Present  ? Feeling of Stress : To some extent  ?Social Connections: Socially Isolated  ? Frequency of Communication with Friends and Family: More than three times a week  ? Frequency of Social Gatherings with Friends and Family: More than three times a week  ? Attends Religious Services: Never  ?  Active Member of Clubs or Organizations: No  ? Attends Archivist Meetings: Never  ? Marital Status: Never married  ?Intimate Partner Violence: Not At Risk  ? Fear of Current or Ex-Partner: No  ? Emotionally Abused: No  ? Physically Abused: No  ? Sexually Abused: No  ? ? ?Outpatient Encounter Medications as of 07/08/2021  ?Medication Sig  ? acetaminophen (TYLENOL) 650 MG CR tablet Take 650 mg by mouth every 8 (eight) hours as needed for pain.  ? albuterol (VENTOLIN HFA) 108 (90 Base) MCG/ACT inhaler INHALE 2 PUFFS EVERY 4 HOURS AS NEEDED FOR WHEEZING.  ? Amantadine HCl 100 MG tablet Take 100 mg by mouth every morning.  ? Bacillus  Coagulans-Inulin (PROBIOTIC) 1-250 BILLION-MG CAPS Take 1 capsule by mouth in the morning.  ? Cholecalciferol (VITAMIN D3) 50 MCG (2000 UT) capsule TAKE 1 CAPSULE BY MOUTH EVERY DAY  ? cloZAPine (CLOZARIL) 100 MG tablet Take 200 mg by mouth at bedtime.  ? entecavir (BARACLUDE) 0.5 MG tablet TAKE (1) TABLET BY MOUTH ONCE DAILY. (Patient taking differently: Take 0.5 mg by mouth daily.)  ? Fish Oil-Cholecalciferol (FISH OIL-VITAMIN D) 1200-1000 MG-UNIT CAPS TAKE 1 CAPSULE BY MOUTH TWICE DAILY.  ? FLUoxetine (PROZAC) 20 MG capsule TAKE 3 CAPSULES BY MOUTH ONCE DAILY.  ? fluticasone (FLONASE) 50 MCG/ACT nasal spray SPRAY 2 SPRAYS IN RIGHT NOSTRIL ONLY TWICE DAILY.  ? Gauze Pads & Dressings (STERILE GAUZE) 3"X3" PADS 2 packets by Does not apply route daily.  ? guaiFENesin (MUCINEX) 600 MG 12 hr tablet Take 1 tablet (600 mg total) by mouth 2 (two) times daily.  ? hydrOXYzine (VISTARIL) 25 MG capsule TAKE 1 CAPSULE BY MOUTH EVERY 8 HOURS AS NEEDED FOR ANXIETY  ? levonorgestrel (MIRENA) 20 MCG/24HR IUD 1 each by Intrauterine route once.  ? loratadine (CLARITIN) 10 MG tablet Take 1 tablet (10 mg total) by mouth daily.  ? mirtazapine (REMERON) 15 MG tablet Take 15 mg by mouth at bedtime.  ? polyethylene glycol powder (GLYCOLAX/MIRALAX) 17 GM/SCOOP powder Take 8.5 g by mouth daily.  ? polyethylene glycol powder (GLYCOLAX/MIRALAX) 17 GM/SCOOP powder Take 17 g by mouth 2 (two) times daily as needed.  ? senna-docusate (SENOKOT-S) 8.6-50 MG tablet Take 1 tablet by mouth 2 (two) times daily between meals as needed for mild constipation.  ? Skin Protectants, Misc. (MINERIN CREME) CREA APPLY MODERATE AMOUNT TOPICALLY TO DRY SKIN ON SOLES & HEELS OF BOTH FEET AFTER SHOWER. (NURSING STAFF TO SUPERVISE APPLICATION) (Patient taking differently: 1 application. See admin instructions. Apply moderate amount topically to dry skin on soles and heels of both feet after shower)  ? Sodium Phosphates (ENEMA READY-TO-USE) 7-19 GM/118ML ENEM Place 1  enema rectally once for 1 dose.  ? Tiotropium Bromide Monohydrate (SPIRIVA RESPIMAT) 1.25 MCG/ACT AERS Inhale 2 puffs into the lungs daily.  ? Transparent Dressings (TEGADERM FILM 6"X8") MISC 1 each by Does not apply route daily.  ? vitamin E 200 UNIT capsule Take 1 capsule (200 Units total) by mouth daily.  ? ?No facility-administered encounter medications on file as of 07/08/2021.  ? ? ?No Known Allergies ? ?Review of Systems  ?Unable to perform ROS: Psychiatric disorder (ROS per caregiver)  ?Constitutional:  Positive for activity change and appetite change. Negative for chills, diaphoresis, fatigue, fever and unexpected weight change.  ?Respiratory:  Negative for cough and shortness of breath.   ?Cardiovascular:  Negative for chest pain, palpitations and leg swelling.  ?Gastrointestinal:  Positive for abdominal pain. Negative for abdominal distention,  anal bleeding, blood in stool, constipation, diarrhea, nausea, rectal pain and vomiting.  ?Genitourinary:  Positive for frequency. Negative for decreased urine volume, difficulty urinating, dysuria, enuresis, flank pain, genital sores, hematuria, menstrual problem, pelvic pain, urgency, vaginal bleeding, vaginal discharge and vaginal pain.  ?Neurological:  Negative for dizziness, weakness and headaches.  ?Psychiatric/Behavioral:  Negative for confusion.   ?All other systems reviewed and are negative. ? ?   ? ?Objective:  ?BP (!) 91/59   Pulse (!) 107   Temp (!) 97 ?F (36.1 ?C)   Ht 5' 3"  (1.6 m)   Wt 102 lb (46.3 kg)   SpO2 94%   BMI 18.07 kg/m?   ? ?Wt Readings from Last 3 Encounters:  ?07/08/21 102 lb (46.3 kg)  ?07/06/21 102 lb 4.8 oz (46.4 kg)  ?07/02/21 106 lb 4 oz (48.2 kg)  ? ? ?Physical Exam ?Vitals and nursing note reviewed.  ?Constitutional:   ?   General: She is not in acute distress. ?   Appearance: She is normal weight. She is not ill-appearing, toxic-appearing or diaphoretic.  ?HENT:  ?   Head: Normocephalic and atraumatic.  ?Eyes:  ?   Pupils:  Pupils are equal, round, and reactive to light.  ?Cardiovascular:  ?   Rate and Rhythm: Normal rate and regular rhythm.  ?   Heart sounds: Normal heart sounds.  ?Pulmonary:  ?   Effort: Pulmonary effort is normal.

## 2021-07-08 NOTE — Patient Instructions (Signed)
Thank you for coming in to clinic today. ? ?1. Your symptoms are consistent with Constipation, likely cause of your General Abdominal Pain / Cramping. ?2. Start with Miralax, prescription was sent to pharmacy. First dose 68g (4 capfuls) in 32oz water over 1 to 2 hours for clean out. Next day start 17g or 1 capful daily, may adjust dose up or down by half a capful every few days. Recommend to take this medicine daily for next 1-2 weeks, you may need to use it longer if needed. ?- Goal is to have soft regular bowel movement 1-3x daily, if too runny or diarrhea, then reduce dose of the medicine to every other day. ? ?Improve water intake, hydration will help ?Also recommend increased vegetables, fruits, fiber intake ?Can try daily Metamucil or Fiber supplement at pharmacy over the counter ? ?Follow-up if symptoms are not improving with bowel movements, or if pain worsens, develop fevers, nausea, vomiting. ? ?Please schedule a follow-up appointment with Margaret Pouch, FNP, in 1 month to follow-up Constipation ? ?If you have any other questions or concerns, please feel free to call the clinic to contact me. You may also schedule an earlier appointment if necessary. ? ?However, if your symptoms get significantly worse, please go to the Emergency Department to seek immediate medical attention. ? ?

## 2021-07-10 LAB — URINE CULTURE

## 2021-07-14 ENCOUNTER — Other Ambulatory Visit: Payer: Self-pay | Admitting: *Deleted

## 2021-07-14 MED ORDER — VITAMIN E 200 UNITS PO CAPS
200.0000 [IU] | ORAL_CAPSULE | Freq: Every day | ORAL | 11 refills | Status: DC
Start: 2021-07-14 — End: 2023-11-28

## 2021-07-23 ENCOUNTER — Ambulatory Visit (INDEPENDENT_AMBULATORY_CARE_PROVIDER_SITE_OTHER): Payer: Medicare Other

## 2021-07-23 VITALS — Wt 104.0 lb

## 2021-07-23 DIAGNOSIS — Z0000 Encounter for general adult medical examination without abnormal findings: Secondary | ICD-10-CM | POA: Diagnosis not present

## 2021-07-23 NOTE — Progress Notes (Signed)
? ?Subjective:  ? Margaret [name] is a 51 y.o. female who presents for Medicare Annual (Subsequent) preventive examination. ? ?Virtual Visit via Telephone Note ? ?I connected with  Margaret [name] on 07/23/21 at  2:45 PM EDT by telephone and verified that I am speaking with the correct person using two identifiers. ? ?Location: ?Patient: Home (group home) ?Provider: [facility] ?Persons participating in the virtual visit: patient, [facility] [facility] - staff [facility] Health Advisor ?  ?I discussed the limitations, risks, security and privacy concerns of performing an evaluation and management service by telephone and the availability of in person appointments. The patient expressed understanding and agreed to proceed. ? ?Interactive audio and video telecommunications were attempted between this nurse and patient, however failed, due to patient having technical difficulties OR patient did not have access to video capability.  We continued and completed visit with audio only. ? ?Some vital signs may be absent or patient reported.  ? ?Margaret Ohm Dionne Ano, LPN  ? ?Review of Systems    ? ?Cardiac Risk Factors include: sedentary lifestyle;Other (see comment);dyslipidemia, Risk factor comments: malnutrition, centriplobular emphysema, right adrenal mass, chronic Hepatitis B ? ?   ?Objective:  ?  ?Today's Vitals  ? 07/23/21 1545  ?Weight: 104 lb (47.2 kg)  ? ?Body mass index is 18.42 kg/m?. ? ? ?  07/23/2021  ?  3:58 PM 05/04/2021  ?  9:28 AM 03/18/2021  ?  1:51 PM 03/04/2021  ?  3:30 AM 03/03/2021  ?  1:11 PM 01/30/2021  ?  7:33 PM 01/29/2021  ? 11:08 PM  ?Advanced Directives  ?Does Patient Have a Medical Advance Directive? Unable to assess, patient is non-responsive or altered mental status No No No No  No  ?Would patient like information on creating a medical advance directive?  No - Patient declined No - Patient declined No - Patient declined No - Patient declined No - Guardian declined   ? ? ?Current  Medications (verified) ?Outpatient Encounter Medications as of 07/23/2021  ?Medication Sig  ? acetaminophen (TYLENOL) 650 MG CR tablet Take 650 mg by mouth every 8 (eight) hours as needed for pain.  ? albuterol (VENTOLIN HFA) 108 (90 Base) MCG/ACT inhaler INHALE 2 PUFFS EVERY 4 HOURS AS NEEDED FOR WHEEZING.  ? Amantadine HCl 100 MG tablet Take 100 mg by mouth every morning.  ? Bacillus Coagulans-Inulin (PROBIOTIC) 1-250 BILLION-MG CAPS Take 1 capsule by mouth in the morning.  ? Cholecalciferol (VITAMIN D3) 50 MCG (2000 UT) capsule TAKE 1 CAPSULE BY MOUTH EVERY DAY  ? cloZAPine (CLOZARIL) 100 MG tablet Take 200 mg by mouth at bedtime.  ? entecavir (BARACLUDE) 0.5 MG tablet TAKE (1) TABLET BY MOUTH ONCE DAILY. (Patient taking differently: Take 0.5 mg by mouth daily.)  ? Fish Oil-Cholecalciferol (FISH OIL-VITAMIN D) 1200-1000 MG-UNIT CAPS TAKE 1 CAPSULE BY MOUTH TWICE DAILY.  ? FLUoxetine (PROZAC) 20 MG capsule TAKE 3 CAPSULES BY MOUTH ONCE DAILY.  ? fluticasone (FLONASE) 50 MCG/ACT nasal spray SPRAY 2 SPRAYS IN RIGHT NOSTRIL ONLY TWICE DAILY.  ? Gauze Pads & Dressings (STERILE GAUZE) 3"X3" PADS 2 packets by Does not apply route daily.  ? guaiFENesin (MUCINEX) 600 MG 12 hr tablet Take 1 tablet (600 mg total) by mouth 2 (two) times daily.  ? hydrOXYzine (VISTARIL) 25 MG capsule TAKE 1 CAPSULE BY MOUTH EVERY 8 HOURS AS NEEDED FOR ANXIETY  ? levonorgestrel (MIRENA) 20 MCG/24HR IUD 1 each by Intrauterine route once.  ? loratadine (CLARITIN) 10 MG  tablet Take 1 tablet (10 mg total) by mouth daily.  ? mirtazapine (REMERON) 15 MG tablet Take 15 mg by mouth at bedtime.  ? polyethylene glycol powder (GLYCOLAX/MIRALAX) 17 GM/SCOOP powder Take 17 g by mouth 2 (two) times daily as needed.  ? senna-docusate (SENOKOT-S) 8.6-50 MG tablet Take 1 tablet by mouth 2 (two) times daily between meals as needed for mild constipation.  ? Skin Protectants, Misc. (MINERIN CREME) CREA APPLY MODERATE AMOUNT TOPICALLY TO DRY SKIN ON SOLES & HEELS  OF BOTH FEET AFTER SHOWER. (NURSING STAFF TO SUPERVISE APPLICATION) (Patient taking differently: 1 application. See admin instructions. Apply moderate amount topically to dry skin on soles and heels of both feet after shower)  ? Tiotropium Bromide Monohydrate (SPIRIVA RESPIMAT) 1.25 MCG/ACT AERS Inhale 2 puffs into the lungs daily.  ? Transparent Dressings (TEGADERM FILM 6"X8") MISC 1 each by Does not apply route daily.  ? vitamin E 200 UNIT capsule Take 1 capsule (200 Units total) by mouth daily.  ? [DISCONTINUED] polyethylene glycol powder (GLYCOLAX/MIRALAX) 17 GM/SCOOP powder Take 8.5 g by mouth daily.  ? ?No facility-administered encounter medications on file as of 07/23/2021.  ? ? ?Allergies (verified) ?Lactose; Olanzapine; Tuberculin, ppd; and Lac bovis  ? ?History: ?Past Medical History:  ?Diagnosis Date  ? Hematuria 11/12/2014  ? Hepatitis B carrier ([place])   ? Hypothyroidism   ? Impulse disorder   ? IUD (intrauterine device) in place 11/12/2014  ? Inserted 12/24/12 at [facility]  ? Mild intellectual disability   ? Schizophrenia ([place])   ? Schizophrenia ([place])   ? Tuberculosis   ? as a child  ? Urinary frequency 11/12/2014  ? ?Past Surgical History:  ?Procedure Laterality Date  ? ARM WOUND REPAIR / CLOSURE    ? TRACHEAL SURGERY    ? TRACHEOSTOMY    ? ?Family History  ?Adopted: Yes  ?Family history unknown: Yes  ? ?Social History  ? ?Socioeconomic History  ? Marital status: Single  ?  Spouse name: Not on file  ? Number of children: 0  ? Years of education: 63  ? Highest education level: High school graduate  ?Occupational History  ? Occupation: disabled  ?  Comment: due to schizophrenia  ?Tobacco Use  ? Smoking status: Never  ?  Passive exposure: Never  ? Smokeless tobacco: Never  ?Vaping Use  ? Vaping Use: Never used  ?Substance and Sexual Activity  ? Alcohol use: No  ?  Alcohol/week: 0.0 standard drinks  ? Drug use: No  ? Sexual activity: Not Currently  ?  Birth control/protection: I.U.D.  ?Other Topics Concern  ? Not on  file  ?Social History Narrative  ? Patient was adopted. She does not have any children and has never been married. She lives in a group home and is disabled. Has a diagnosis of schizophrenia. She socially isolates herself, doesn't like to participate in group activities, sleeps a lot, has to be prompted to bathe, eat, dress, etc. -07/22/20  ? ?Social Determinants of Health  ? ?Financial Resource Strain: Low Risk   ? Difficulty of Paying Living Expenses: Not hard at all  ?Food Insecurity: No Food Insecurity  ? Worried About Charity fundraiser in the Last Year: Never true  ? Ran Out of Food in the Last Year: Never true  ?Transportation Needs: No Transportation Needs  ? Lack of Transportation (Medical): No  ? Lack of Transportation (Non-Medical): No  ?Physical Activity: Insufficiently Active  ? Days of Exercise per Week: 7 days  ?  Minutes of Exercise per Session: 20 min  ?Stress: No Stress Concern Present  ? Feeling of Stress : Only a little  ?Social Connections: Socially Isolated  ? Frequency of Communication with Friends and Family: More than three times a week  ? Frequency of Social Gatherings with Friends and Family: More than three times a week  ? Attends Religious Services: Never  ? Active Member of Clubs or Organizations: No  ? Attends Archivist Meetings: Never  ? Marital Status: Never married  ? ? ?Tobacco Counseling ?Counseling given: Not Answered ? ? ?Clinical Intake: ? ?Pre-visit preparation completed: Yes ? ?Pain : No/denies pain ? ?  ? ?BMI - recorded: 18.42 ?Nutritional Status: BMI <19  Underweight ?Nutritional Risks: None ?Diabetes: No ? ?How often do you need to have someone help you when you read instructions, pamphlets, or other written materials from your doctor or pharmacy?: 1 - Never ? ?Diabetic? no ? ?Interpreter Needed?: No ? ?Information entered by :: Jerl Munyan, LPN ? ? ?Activities of Daily Living ? ?  07/23/2021  ?  3:56 PM 03/04/2021  ?  3:30 AM  ?In your present state of health, do  you have any difficulty performing the following activities:  ?Hearing? 0 0  ?Vision? 0 0  ?Difficulty concentrating or making decisions? 1 1  ?Walking or climbing stairs? 0 1  ?Dressing or bathing? 1 1  ?Com

## 2021-07-23 NOTE — Patient Instructions (Signed)
Ms. Vickers , ?Thank you for taking time to come for your Medicare Wellness Visit. I appreciate your ongoing commitment to your health goals. Please review the following plan we discussed and let me know if I can assist you in the future.  ? ?Screening recommendations/referrals: ?Colonoscopy: Due at age 50 ?Mammogram: Done 12/02/2020 - Repeat annually  ?Bone Density: Due at age 54 ?Recommended yearly ophthalmology/optometry visit for glaucoma screening and checkup ?Recommended yearly dental visit for hygiene and checkup ? ?Vaccinations: ?Influenza vaccine: Done 01/09/2021 - Repeat annually ?Pneumococcal vaccine: Due after age 66 ?Tdap vaccine: Done 06/29/2016 - Repeat in 10 years ?Shingles vaccine: Due after age 42  ?Covid-19: Done 06/10/2019, 07/09/2019, 02/11/2020, & 02/15/2021 ? ?Advanced directives: Please bring a copy of your health care power of attorney and living will to the office to be added to your chart at your convenience.  ? ?Conditions/risks identified: Aim for 30 minutes of exercise or brisk walking, 6-8 glasses of water, and 5 servings of fruits and vegetables each day.  ? ?Next appointment: Follow up in one year for your annual wellness visit.  ? ?Preventive Care 40-64 Years, Female ?Preventive care refers to lifestyle choices and visits with your health care provider that can promote health and wellness. ?What does preventive care include? ?A yearly physical exam. This is also called an annual well check. ?Dental exams once or twice a year. ?Routine eye exams. Ask your health care provider how often you should have your eyes checked. ?Personal lifestyle choices, including: ?Daily care of your teeth and gums. ?Regular physical activity. ?Eating a healthy diet. ?Avoiding tobacco and drug use. ?Limiting alcohol use. ?Practicing safe sex. ?Taking low-dose aspirin daily starting at age 47. ?Taking vitamin and mineral supplements as recommended by your health care provider. ?What happens during an annual well  check? ?The services and screenings done by your health care provider during your annual well check will depend on your age, overall health, lifestyle risk factors, and family history of disease. ?Counseling  ?Your health care provider may ask you questions about your: ?Alcohol use. ?Tobacco use. ?Drug use. ?Emotional well-being. ?Home and relationship well-being. ?Sexual activity. ?Eating habits. ?Work and work Statistician. ?Method of birth control. ?Menstrual cycle. ?Pregnancy history. ?Screening  ?You may have the following tests or measurements: ?Height, weight, and BMI. ?Blood pressure. ?Lipid and cholesterol levels. These may be checked every 5 years, or more frequently if you are over 47 years old. ?Skin check. ?Lung cancer screening. You may have this screening every year starting at age 50 if you have a 30-pack-year history of smoking and currently smoke or have quit within the past 15 years. ?Fecal occult blood test (FOBT) of the stool. You may have this test every year starting at age 62. ?Flexible sigmoidoscopy or colonoscopy. You may have a sigmoidoscopy every 5 years or a colonoscopy every 10 years starting at age 74. ?Hepatitis C blood test. ?Hepatitis B blood test. ?Sexually transmitted disease (STD) testing. ?Diabetes screening. This is done by checking your blood sugar (glucose) after you have not eaten for a while (fasting). You may have this done every 1-3 years. ?Mammogram. This may be done every 1-2 years. Talk to your health care provider about when you should start having regular mammograms. This may depend on whether you have a family history of breast cancer. ?BRCA-related cancer screening. This may be done if you have a family history of breast, ovarian, tubal, or peritoneal cancers. ?Pelvic exam and Pap test. This may be done  every 3 years starting at age 27. Starting at age 55, this may be done every 5 years if you have a Pap test in combination with an HPV test. ?Bone density scan. This  is done to screen for osteoporosis. You may have this scan if you are at high risk for osteoporosis. ?Discuss your test results, treatment options, and if necessary, the need for more tests with your health care provider. ?Vaccines  ?Your health care provider may recommend certain vaccines, such as: ?Influenza vaccine. This is recommended every year. ?Tetanus, diphtheria, and acellular pertussis (Tdap, Td) vaccine. You may need a Td booster every 10 years. ?Zoster vaccine. You may need this after age 43. ?Pneumococcal 13-valent conjugate (PCV13) vaccine. You may need this if you have certain conditions and were not previously vaccinated. ?Pneumococcal polysaccharide (PPSV23) vaccine. You may need one or two doses if you smoke cigarettes or if you have certain conditions. ?Talk to your health care provider about which screenings and vaccines you need and how often you need them. ?This information is not intended to replace advice given to you by your health care provider. Make sure you discuss any questions you have with your health care provider. ?Document Released: 04/17/2015 Document Revised: 12/09/2015 Document Reviewed: 01/20/2015 ?Elsevier Interactive Patient Education ? 2017 Else[employer] ? ? ? ?Fall Prevention in the Home ?Falls can cause injuries. They can happen to people of all ages. There are many things you can do to make your home safe and to help prevent falls. ?What can I do on the outside of my home? ?Regularly fix the edges of walkways and driveways and fix any cracks. ?Remove anything that might make you trip as you walk through a door, such as a raised step or threshold. ?Trim any bushes or trees on the path to your home. ?Use bright outdoor lighting. ?Clear any walking paths of anything that might make someone trip, such as rocks or tools. ?Regularly check to see if handrails are loose or broken. Make sure that both sides of any steps have handrails. ?Any raised decks and porches should have  guardrails on the edges. ?Have any leaves, snow, or ice cleared regularly. ?Use sand or salt on walking paths during winter. ?Clean up any spills in your garage right away. This includes oil or grease spills. ?What can I do in the bathroom? ?Use night lights. ?Install grab bars by the toilet and in the tub and shower. Do not use towel bars as grab bars. ?Use non-skid mats or decals in the tub or shower. ?If you need to sit down in the shower, use a plastic, non-slip stool. ?Keep the floor dry. Clean up any water that spills on the floor as soon as it happens. ?Remove soap buildup in the tub or shower regularly. ?Attach bath mats securely with double-sided non-slip rug tape. ?Do not have throw rugs and other things on the floor that can make you trip. ?What can I do in the bedroom? ?Use night lights. ?Make sure that you have a light by your bed that is easy to reach. ?Do not use any sheets or blankets that are too big for your bed. They should not hang down onto the floor. ?Have a firm chair that has side arms. You can use this for support while you get dressed. ?Do not have throw rugs and other things on the floor that can make you trip. ?What can I do in the kitchen? ?Clean up any spills right away. ?Avoid walking on  wet floors. ?Keep items that you use a lot in easy-to-reach places. ?If you need to reach something above you, use a strong step stool that has a grab bar. ?Keep electrical cords out of the way. ?Do not use floor polish or wax that makes floors slippery. If you must use wax, use non-skid floor wax. ?Do not have throw rugs and other things on the floor that can make you trip. ?What can I do with my stairs? ?Do not leave any items on the stairs. ?Make sure that there are handrails on both sides of the stairs and use them. Fix handrails that are broken or loose. Make sure that handrails are as long as the stairways. ?Check any carpeting to make sure that it is firmly attached to the stairs. Fix any carpet  that is loose or worn. ?Avoid having throw rugs at the top or bottom of the stairs. If you do have throw rugs, attach them to the floor with carpet tape. ?Make sure that you have a light switch at the to

## 2021-07-27 ENCOUNTER — Other Ambulatory Visit: Payer: Self-pay | Admitting: Family Medicine

## 2021-09-01 ENCOUNTER — Other Ambulatory Visit (INDEPENDENT_AMBULATORY_CARE_PROVIDER_SITE_OTHER): Payer: Self-pay | Admitting: Gastroenterology

## 2021-09-01 DIAGNOSIS — B1910 Unspecified viral hepatitis B without hepatic coma: Secondary | ICD-10-CM

## 2021-09-02 ENCOUNTER — Telehealth (INDEPENDENT_AMBULATORY_CARE_PROVIDER_SITE_OTHER): Payer: Self-pay | Admitting: *Deleted

## 2021-09-02 NOTE — Telephone Encounter (Signed)
Seen 07/06/21

## 2021-09-02 NOTE — Telephone Encounter (Signed)
Refill request from pharm. Ok to refill per dr Lucianne Muss - Per dr Laural Golden - pt needs to have bw done. Tried to call care giver [place] wilson and no answer.

## 2021-09-06 ENCOUNTER — Other Ambulatory Visit: Payer: Self-pay | Admitting: Family Medicine

## 2021-09-06 ENCOUNTER — Other Ambulatory Visit (INDEPENDENT_AMBULATORY_CARE_PROVIDER_SITE_OTHER): Payer: Self-pay | Admitting: Internal Medicine

## 2021-09-06 DIAGNOSIS — K5909 Other constipation: Secondary | ICD-10-CM

## 2021-09-06 DIAGNOSIS — R1030 Lower abdominal pain, unspecified: Secondary | ICD-10-CM

## 2021-09-06 NOTE — Telephone Encounter (Signed)
Last seen 07/06/2021 by Dr. Laural Golden.

## 2021-09-06 NOTE — Telephone Encounter (Signed)
Next appointment 01/06/2022.

## 2021-09-08 NOTE — Telephone Encounter (Signed)
Tried to call [place] wilson from group home and no answer.

## 2021-09-27 ENCOUNTER — Other Ambulatory Visit: Payer: Self-pay | Admitting: Family Medicine

## 2021-09-27 ENCOUNTER — Ambulatory Visit (INDEPENDENT_AMBULATORY_CARE_PROVIDER_SITE_OTHER): Payer: Medicare Other | Admitting: Pulmonary Disease

## 2021-09-27 ENCOUNTER — Encounter: Payer: Self-pay | Admitting: Pulmonary Disease

## 2021-09-27 VITALS — BP 118/72 | HR 98 | Temp 97.8°F | Ht 63.0 in | Wt 104.6 lb

## 2021-09-27 DIAGNOSIS — J42 Unspecified chronic bronchitis: Secondary | ICD-10-CM

## 2021-09-27 MED ORDER — FLUTICASONE-SALMETEROL 115-21 MCG/ACT IN AERO: 2.0000 | INHALATION_SPRAY | Freq: Two times a day (BID) | RESPIRATORY_TRACT | 5 refills | Status: DC

## 2021-09-27 NOTE — Progress Notes (Signed)
@Patient  ID: Margaret [name], female    DOB: 01-03-1971, 51 y.o.   MRN: [id number]  Chief Complaint  Patient presents with   Follow-up    Pt states she has been doing okay since last visit and denies any complaints.    Referring provider: Dettinger, Elige Radon, MD  HPI: 51 year old female never smoker with emphysema, hx childhood TB, schizophrenia, complex ovarian cyst, intellectual disability, protein calorie malnutrition, hepatitis B, orthostatic hypotension. who presents for follow-up. Her caregiver is present.  Synopsis: She has had two recent hospitalizations related to multifocal pneumonia in setting of aspiration. First hospitalization complicated by empyema requiring chest tube in early November followed by readmission for pneumonia. Barium negative and was placed on regular diet.  04/22/21 Since our last visit she reports feeling overall well. Denies fevers chills. No cough, wheezing or shortness of breath. However she is concerned about the site on right thorax where her prior chest tube was in early November that is tender to touch. She has not kept it dressed or protected.   05/20/21 She presents with her caregiver. Since our last visit she has completed her antibiotics for cellulitis and removes resolved pain at prior chest tube site. She has productive cough that started 2 days ago. Amount of sputum significant with copious amount of thick yellow phlegm. Denies fevers, chills. Denies wheezing or shortness of breath.  06/22/21 Since our last visit, she has completed 3 weeks of antibiotics. However continues to have productive cough with green sputum. Denies shortness of breath. No fevers or chills.  09/27/21 She presents with her caregiver. Since our last visit she continues to have productive cough with green sputum. No wheezing or shortnes of breath. Compliant with Spiriva without changes.  Review of Systems  Constitutional:  Negative for chills, diaphoresis, fever,  malaise/fatigue and weight loss.  HENT:  Negative for congestion.   Respiratory:  Positive for cough and sputum production. Negative for hemoptysis, shortness of breath and wheezing.   Cardiovascular:  Negative for chest pain, palpitations and leg swelling.    Allergies  Allergen Reactions   Lactose Nausea And Vomiting    GI upset   Olanzapine     Other reaction(s): UNKNOWN   Tuberculin, Ppd     Other reaction(s): UNKNOWN   Lac Bovis Nausea And Vomiting    Other reaction(s): GI upset    Immunization History  Administered Date(s) Administered   Influenza,inj,Quad PF,6+ Mos 12/30/2015, 12/30/2016, 03/14/2018, 02/20/2019   Influenza-Unspecified 01/12/2015, 01/07/2020, 01/09/2021   Moderna Covid-19 Vaccine Bivalent Booster 12yrs & up 02/15/2021   Moderna Sars-Covid-2 Vaccination 06/10/2019, 07/09/2019, 02/11/2020    Past Medical History:  Diagnosis Date   Hematuria 11/12/2014   Hepatitis B carrier (HCC)    Hypothyroidism    Impulse disorder    IUD (intrauterine device) in place 11/12/2014   Inserted 12/24/12 at [facility]   Mild intellectual disability    Schizophrenia (HCC)    Schizophrenia (HCC)    Tuberculosis    as a child   Urinary frequency 11/12/2014    Tobacco History: Social History   Tobacco Use  Smoking Status Never   Passive exposure: Never  Smokeless Tobacco Never   Counseling given: Not Answered   Outpatient Medications Prior to Visit  Medication Sig Dispense Refill   acetaminophen (TYLENOL) 650 MG CR tablet Take 650 mg by mouth every 8 (eight) hours as needed for pain.     albuterol (VENTOLIN HFA) 108 (90 Base) MCG/ACT inhaler INHALE 2 PUFFS EVERY 4  HOURS AS NEEDED FOR WHEEZING. 36 g 2   Amantadine HCl 100 MG tablet Take 100 mg by mouth every morning.     Cholecalciferol (VITAMIN D3) 50 MCG (2000 UT) capsule TAKE 1 CAPSULE BY MOUTH EVERY DAY 30 capsule PRN   cloZAPine (CLOZARIL) 100 MG tablet Take 200 mg by mouth at bedtime.     entecavir (BARACLUDE) 0.5 MG  tablet TAKE (1) TABLET BY MOUTH ONCE DAILY. 30 tablet 5   Fish Oil-Cholecalciferol (FISH OIL-VITAMIN D) 1200-1000 MG-UNIT CAPS TAKE 1 CAPSULE BY MOUTH TWICE DAILY. 60 capsule PRN   FLUoxetine (PROZAC) 20 MG capsule TAKE 3 CAPSULES BY MOUTH ONCE DAILY. 90 capsule 11   fluticasone (FLONASE) 50 MCG/ACT nasal spray SPRAY 2 SPRAYS IN RIGHT NOSTRIL ONLY TWICE DAILY. 16 g 0   Gauze Pads & Dressings (STERILE GAUZE) 3"X3" PADS 2 packets by Does not apply route daily. 30 each 1   GOODSENSE CLEARLAX 17 GM/SCOOP powder TAKE 17G (1 CAPFUL) TWICE A DAY 510 g 2   guaiFENesin (MUCINEX) 600 MG 12 hr tablet Take 1 tablet (600 mg total) by mouth 2 (two) times daily. 60 tablet 2   levonorgestrel (MIRENA) 20 MCG/24HR IUD 1 each by Intrauterine route once.     loratadine (CLARITIN) 10 MG tablet Take 1 tablet (10 mg total) by mouth daily. 90 tablet 3   mirtazapine (REMERON) 15 MG tablet Take 15 mg by mouth at bedtime.     Skin Protectants, Misc. (MINERIN CREME) CREA APPLY MODERATE AMOUNT TOPICALLY TO DRY SKIN ON SOLES & HEELS OF BOTH FEET AFTER SHOWER. (NURSING STAFF TO SUPERVISE APPLICATION) (Patient taking differently: 1 application  See admin instructions. Apply moderate amount topically to dry skin on soles and heels of both feet after shower) 454 g PRN   Tiotropium Bromide Monohydrate (SPIRIVA RESPIMAT) 1.25 MCG/ACT AERS Inhale 2 puffs into the lungs daily. 4 g 5   Transparent Dressings (TEGADERM FILM 6"X8") MISC 1 each by Does not apply route daily. 1 each 1   vitamin E 200 UNIT capsule Take 1 capsule (200 Units total) by mouth daily. 30 capsule 11   Bacillus Coagulans-Inulin (PROBIOTIC) 1-250 BILLION-MG CAPS Take 1 capsule by mouth in the morning. 14 capsule 0   hydrOXYzine (VISTARIL) 25 MG capsule TAKE 1 CAPSULE BY MOUTH EVERY 8 HOURS AS NEEDED FOR ANXIETY 90 capsule 11   senna-docusate (SENOKOT-S) 8.6-50 MG tablet Take 1 tablet by mouth 2 (two) times daily between meals as needed for mild constipation. 180 tablet  0   No facility-administered medications prior to visit.      Objective:  BP 118/72 (BP Location: Left Arm, Patient Position: Sitting, Cuff Size: Normal)   Pulse 98   Temp 97.8 F (36.6 C) (Oral)   Ht 5\' 3"  (1.6 m)   Wt 104 lb 9.6 oz (47.4 kg)   SpO2 98% Comment: RA  BMI 18.53 kg/m   Physical Exam: General: Well-appearing, no acute distress HENT: Success, AT Eyes: EOMI, no scleral icterus Respiratory: Diminished breath sounds to auscultation bilaterally.  No crackles, wheezing or rales Cardiovascular: RRR, -M/R/G, no JVD Extremities:-Edema,-tenderness Neuro: AAO x4, CNII-XII grossly intact Psych: Normal mood, normal affect   Lab Results:  CBC    Component Value Date/Time   WBC 18.4 (H) 07/02/2021 1629   WBC 7.2 03/22/2021 1242   RBC 3.99 07/02/2021 1629   RBC 4.26 03/22/2021 1242   HGB 10.8 (L) 07/02/2021 1629   HCT 32.9 (L) 07/02/2021 1629   PLT 392 07/02/2021 1629  MCV 83 07/02/2021 1629   MCH 27.1 07/02/2021 1629   MCH 26.1 03/07/2021 0541   MCHC 32.8 07/02/2021 1629   MCHC 31.2 03/22/2021 1242   RDW 14.2 07/02/2021 1629   LYMPHSABS 2.8 07/02/2021 1629   MONOABS 0.5 03/22/2021 1242   EOSABS 0.2 07/02/2021 1629   BASOSABS 0.1 07/02/2021 1629    BMET    Component Value Date/Time   NA 140 07/02/2021 1629   K 4.0 07/02/2021 1629   CL 103 07/02/2021 1629   CO2 22 07/02/2021 1629   GLUCOSE 96 07/02/2021 1629   GLUCOSE 81 03/22/2021 1242   BUN 15 07/02/2021 1629   CREATININE 0.56 (L) 07/02/2021 1629   CREATININE 0.73 06/30/2020 1054   CALCIUM 8.7 07/02/2021 1629   GFRNONAA >60 03/07/2021 0541   GFRAA 102 01/08/2020 1045    BNP    Component Value Date/Time   BNP 70.2 02/01/2021 2146     Imaging: CT Chest 03/04/21 - Bilateral atelectasis and infiltrate, small bilateral pleural effusions CXR 03/22/22 - Bibasilar opacities, unchanged CT Chest 04/23/21 - Improved small bilateral pleural effusions with loculation. Overall improved bilateral airspace  disease however interval RUL opacity CXR 05/20/21 - Opacities in bilateral lower lobes. LLL atelectasis. CT Chest 06/11/21 - Improved prior opacities with patchy consolidation bilaterally, improved small bilateral effusions  No results found.        No data to display          No results found for: "NITRICOXIDE"      Assessment & Plan:  51 year old female never smoker with emphysema, hx childhood TB, schizophrenia, complex ovarian cyst, intellectual disability, hepatitis B who presents for follow-up. Has chronic bronchitis not controlled on LAMA.  Chronic bronchitis - persistent symptoms --START Advair 115-21 mcg TWO puffs TWICE a day --CONTINUE Spiriva 1.25 mcg TWO puffs ONCE a day --CONTINUE Albuterol AS NEEDED for shortness of breath or wheezing --Change Mucinex to twice a day AS NEEDED for chest congestions  Allergic Rhinitis --CONTINUE loratadine 10 mg daily  Follow-up with me in 3 months  I have spent a total time of 32-minutes on the day of the appointment including chart review, data review, collecting history, coordinating care and discussing medical diagnosis and plan with the patient/family. Past medical history, allergies, medications were reviewed. Pertinent imaging, labs and tests included in this note have been reviewed and interpreted independently by me.  Makaleigh Reinard Mechele Collin, MD 09/27/2021

## 2021-10-06 ENCOUNTER — Other Ambulatory Visit (HOSPITAL_COMMUNITY): Payer: Self-pay

## 2021-10-07 ENCOUNTER — Other Ambulatory Visit: Payer: Self-pay

## 2021-10-07 ENCOUNTER — Encounter (HOSPITAL_COMMUNITY): Payer: Self-pay | Admitting: Emergency Medicine

## 2021-10-07 ENCOUNTER — Inpatient Hospital Stay (HOSPITAL_COMMUNITY)
Admission: EM | Admit: 2021-10-07 | Discharge: 2021-10-09 | DRG: 392 | Disposition: A | Discharge status: Home / Self Care | Payer: Medicare Other | Attending: Family Medicine | Admitting: Family Medicine

## 2021-10-07 ENCOUNTER — Emergency Department (HOSPITAL_COMMUNITY): Payer: Medicare Other

## 2021-10-07 DIAGNOSIS — Z79899 Other long term (current) drug therapy: Secondary | ICD-10-CM | POA: Diagnosis not present

## 2021-10-07 DIAGNOSIS — E039 Hypothyroidism, unspecified: Secondary | ICD-10-CM | POA: Diagnosis present

## 2021-10-07 DIAGNOSIS — R1110 Vomiting, unspecified: Secondary | ICD-10-CM | POA: Diagnosis not present

## 2021-10-07 DIAGNOSIS — D72829 Elevated white blood cell count, unspecified: Secondary | ICD-10-CM

## 2021-10-07 DIAGNOSIS — J439 Emphysema, unspecified: Secondary | ICD-10-CM | POA: Diagnosis not present

## 2021-10-07 DIAGNOSIS — J189 Pneumonia, unspecified organism: Secondary | ICD-10-CM

## 2021-10-07 DIAGNOSIS — R1010 Upper abdominal pain, unspecified: Secondary | ICD-10-CM

## 2021-10-07 DIAGNOSIS — R112 Nausea with vomiting, unspecified: Secondary | ICD-10-CM

## 2021-10-07 DIAGNOSIS — R109 Unspecified abdominal pain: Secondary | ICD-10-CM

## 2021-10-07 DIAGNOSIS — K5909 Other constipation: Secondary | ICD-10-CM

## 2021-10-07 DIAGNOSIS — R1033 Periumbilical pain: Secondary | ICD-10-CM | POA: Diagnosis present

## 2021-10-07 DIAGNOSIS — R1030 Lower abdominal pain, unspecified: Secondary | ICD-10-CM

## 2021-10-07 DIAGNOSIS — R9431 Abnormal electrocardiogram [ECG] [EKG]: Secondary | ICD-10-CM | POA: Diagnosis not present

## 2021-10-07 DIAGNOSIS — Z8611 Personal history of tuberculosis: Secondary | ICD-10-CM

## 2021-10-07 DIAGNOSIS — Z888 Allergy status to other drugs, medicaments and biological substances status: Secondary | ICD-10-CM | POA: Diagnosis not present

## 2021-10-07 DIAGNOSIS — R6250 Unspecified lack of expected normal physiological development in childhood: Secondary | ICD-10-CM | POA: Diagnosis present

## 2021-10-07 DIAGNOSIS — Z887 Allergy status to serum and vaccine status: Secondary | ICD-10-CM

## 2021-10-07 DIAGNOSIS — J449 Chronic obstructive pulmonary disease, unspecified: Secondary | ICD-10-CM | POA: Diagnosis not present

## 2021-10-07 DIAGNOSIS — K5000 Crohn's disease of small intestine without complications: Secondary | ICD-10-CM | POA: Diagnosis not present

## 2021-10-07 DIAGNOSIS — F209 Schizophrenia, unspecified: Secondary | ICD-10-CM | POA: Diagnosis present

## 2021-10-07 DIAGNOSIS — F70 Mild intellectual disabilities: Secondary | ICD-10-CM | POA: Diagnosis present

## 2021-10-07 DIAGNOSIS — R1084 Generalized abdominal pain: Secondary | ICD-10-CM | POA: Diagnosis not present

## 2021-10-07 DIAGNOSIS — F639 Impulse disorder, unspecified: Secondary | ICD-10-CM | POA: Diagnosis present

## 2021-10-07 DIAGNOSIS — D75839 Thrombocytosis, unspecified: Secondary | ICD-10-CM

## 2021-10-07 DIAGNOSIS — D649 Anemia, unspecified: Secondary | ICD-10-CM

## 2021-10-07 DIAGNOSIS — J690 Pneumonitis due to inhalation of food and vomit: Secondary | ICD-10-CM | POA: Diagnosis not present

## 2021-10-07 DIAGNOSIS — J984 Other disorders of lung: Secondary | ICD-10-CM

## 2021-10-07 DIAGNOSIS — R079 Chest pain, unspecified: Secondary | ICD-10-CM | POA: Diagnosis not present

## 2021-10-07 DIAGNOSIS — D75838 Other thrombocytosis: Secondary | ICD-10-CM | POA: Diagnosis present

## 2021-10-07 DIAGNOSIS — R748 Abnormal levels of other serum enzymes: Secondary | ICD-10-CM | POA: Diagnosis not present

## 2021-10-07 DIAGNOSIS — Z91011 Allergy to milk products: Secondary | ICD-10-CM | POA: Diagnosis not present

## 2021-10-07 DIAGNOSIS — K529 Noninfective gastroenteritis and colitis, unspecified: Secondary | ICD-10-CM | POA: Diagnosis not present

## 2021-10-07 LAB — CBC WITH DIFFERENTIAL/PLATELET
Abs Immature Granulocytes: 0.11 10*3/uL — ABNORMAL HIGH (ref 0.00–0.07)
Basophils Absolute: 0.1 10*3/uL (ref 0.0–0.1)
Basophils Relative: 1 %
Eosinophils Absolute: 0.2 10*3/uL (ref 0.0–0.5)
Eosinophils Relative: 1 %
HCT: 35.3 % — ABNORMAL LOW (ref 36.0–46.0)
Hemoglobin: 10.8 g/dL — ABNORMAL LOW (ref 12.0–15.0)
Immature Granulocytes: 1 %
Lymphocytes Relative: 14 %
Lymphs Abs: 3 10*3/uL (ref 0.7–4.0)
MCH: 26 pg (ref 26.0–34.0)
MCHC: 30.6 g/dL (ref 30.0–36.0)
MCV: 85.1 fL (ref 80.0–100.0)
Monocytes Absolute: 1.1 10*3/uL — ABNORMAL HIGH (ref 0.1–1.0)
Monocytes Relative: 5 %
Neutro Abs: 16.5 10*3/uL — ABNORMAL HIGH (ref 1.7–7.7)
Neutrophils Relative %: 78 %
Platelets: 453 10*3/uL — ABNORMAL HIGH (ref 150–400)
RBC: 4.15 MIL/uL (ref 3.87–5.11)
RDW: 14.6 % (ref 11.5–15.5)
WBC: 20.9 10*3/uL — ABNORMAL HIGH (ref 4.0–10.5)
nRBC: 0 % (ref 0.0–0.2)

## 2021-10-07 LAB — COMPREHENSIVE METABOLIC PANEL
ALT: 14 U/L (ref 0–44)
AST: 20 U/L (ref 15–41)
Albumin: 3.1 g/dL — ABNORMAL LOW (ref 3.5–5.0)
Alkaline Phosphatase: 104 U/L (ref 38–126)
Anion gap: 12 (ref 5–15)
BUN: 17 mg/dL (ref 6–20)
CO2: 24 mmol/L (ref 22–32)
Calcium: 8.9 mg/dL (ref 8.9–10.3)
Chloride: 99 mmol/L (ref 98–111)
Creatinine, Ser: 0.62 mg/dL (ref 0.44–1.00)
GFR, Estimated: >60 mL/min (ref >60)
Glucose, Bld: 104 mg/dL — ABNORMAL HIGH (ref 70–99)
Potassium: 4.5 mmol/L (ref 3.5–5.1)
Sodium: 135 mmol/L (ref 135–145)
Total Bilirubin: 0.3 mg/dL (ref 0.3–1.2)
Total Protein: 7.8 g/dL (ref 6.5–8.1)

## 2021-10-07 LAB — TROPONIN I (HIGH SENSITIVITY)
Troponin I (High Sensitivity): 3 ng/L (ref <18)
Troponin I (High Sensitivity): <2 ng/L (ref <18)

## 2021-10-07 LAB — CBC
HCT: 30.7 % — ABNORMAL LOW (ref 36.0–46.0)
Hemoglobin: 9.7 g/dL — ABNORMAL LOW (ref 12.0–15.0)
MCH: 26.1 pg (ref 26.0–34.0)
MCHC: 31.6 g/dL (ref 30.0–36.0)
MCV: 82.7 fL (ref 80.0–100.0)
Platelets: 351 10*3/uL (ref 150–400)
RBC: 3.71 MIL/uL — ABNORMAL LOW (ref 3.87–5.11)
RDW: 14.8 % (ref 11.5–15.5)
WBC: 20.7 10*3/uL — ABNORMAL HIGH (ref 4.0–10.5)
nRBC: 0 % (ref 0.0–0.2)

## 2021-10-07 LAB — LACTIC ACID, PLASMA: Lactic Acid, Venous: 1.1 mmol/L (ref 0.5–1.9)

## 2021-10-07 LAB — CREATININE, SERUM
Creatinine, Ser: 0.52 mg/dL (ref 0.44–1.00)
GFR, Estimated: >60 mL/min (ref >60)

## 2021-10-07 LAB — LIPASE, BLOOD: Lipase: 52 U/L — ABNORMAL HIGH (ref 11–51)

## 2021-10-07 MED ORDER — ONDANSETRON HCL 4 MG/2ML IJ SOLN
4.0000 mg | Freq: Once | INTRAMUSCULAR | Status: AC
  Administered 2021-10-07: 4 mg via INTRAVENOUS

## 2021-10-07 MED ORDER — ONDANSETRON HCL 4 MG/2ML IJ SOLN
INTRAMUSCULAR | Status: AC
  Filled 2021-10-07: qty 2

## 2021-10-07 MED ORDER — ONDANSETRON HCL 4 MG PO TABS: 4.0000 mg | ORAL_TABLET | Freq: Four times a day (QID) | ORAL | Status: DC | PRN

## 2021-10-07 MED ORDER — ENOXAPARIN SODIUM 40 MG/0.4ML IJ SOSY
40.0000 mg | PREFILLED_SYRINGE | INTRAMUSCULAR | Status: DC
  Administered 2021-10-07 – 2021-10-08 (×2): 40 mg via SUBCUTANEOUS
  Filled 2021-10-07 (×2): qty 0.4

## 2021-10-07 MED ORDER — FLUOXETINE HCL 20 MG PO CAPS
60.0000 mg | ORAL_CAPSULE | Freq: Every day | ORAL | Status: DC
  Administered 2021-10-08 – 2021-10-09 (×2): 60 mg via ORAL
  Filled 2021-10-07 (×2): qty 3

## 2021-10-07 MED ORDER — IOHEXOL 300 MG/ML  SOLN
75.0000 mL | Freq: Once | INTRAMUSCULAR | Status: AC | PRN
  Administered 2021-10-07: 75 mL via INTRAVENOUS

## 2021-10-07 MED ORDER — SODIUM CHLORIDE 0.9 % IV SOLN
3.0000 g | Freq: Four times a day (QID) | INTRAVENOUS | Status: DC
  Administered 2021-10-08 – 2021-10-09 (×5): 3 g via INTRAVENOUS
  Filled 2021-10-07: qty 8

## 2021-10-07 MED ORDER — SODIUM CHLORIDE 0.9 % IV SOLN
500.0000 mg | INTRAVENOUS | Status: DC
  Administered 2021-10-07: 500 mg via INTRAVENOUS
  Filled 2021-10-07: qty 5

## 2021-10-07 MED ORDER — DM-GUAIFENESIN ER 30-600 MG PO TB12
1.0000 | ORAL_TABLET | Freq: Two times a day (BID) | ORAL | Status: DC
  Administered 2021-10-07 – 2021-10-09 (×4): 1 via ORAL
  Filled 2021-10-07 (×4): qty 1

## 2021-10-07 MED ORDER — ACETAMINOPHEN 325 MG PO TABS
650.0000 mg | ORAL_TABLET | Freq: Four times a day (QID) | ORAL | Status: DC | PRN
  Administered 2021-10-07: 650 mg via ORAL
  Filled 2021-10-07: qty 2

## 2021-10-07 MED ORDER — SODIUM CHLORIDE 0.9 % IV BOLUS
500.0000 mL | Freq: Once | INTRAVENOUS | Status: AC
  Administered 2021-10-07: 500 mL via INTRAVENOUS

## 2021-10-07 MED ORDER — ONDANSETRON HCL 4 MG/2ML IJ SOLN: 4.0000 mg | Freq: Four times a day (QID) | INTRAMUSCULAR | Status: DC | PRN

## 2021-10-07 MED ORDER — ACETAMINOPHEN 650 MG RE SUPP: 650.0000 mg | Freq: Four times a day (QID) | RECTAL | Status: DC | PRN

## 2021-10-07 MED ORDER — SODIUM CHLORIDE 0.9 % IV SOLN
3.0000 g | Freq: Once | INTRAVENOUS | Status: AC
  Administered 2021-10-07: 3 g via INTRAVENOUS
  Filled 2021-10-07: qty 8

## 2021-10-07 MED ORDER — SODIUM CHLORIDE 0.9 % IV SOLN: INTRAVENOUS | Status: DC

## 2021-10-07 MED ORDER — CLOZAPINE 100 MG PO TABS
400.0000 mg | ORAL_TABLET | Freq: Every day | ORAL | Status: DC
  Administered 2021-10-08 (×2): 400 mg via ORAL
  Filled 2021-10-07 (×5): qty 4

## 2021-10-07 MED ORDER — PANTOPRAZOLE SODIUM 40 MG IV SOLR
40.0000 mg | INTRAVENOUS | Status: DC
  Administered 2021-10-07: 40 mg via INTRAVENOUS
  Filled 2021-10-07: qty 10

## 2021-10-07 NOTE — Progress Notes (Signed)
PHARMACY - CLOZAPINE  PHARMACIST - PHYSICIAN ORDER COMMUNICATION  Margaret [name] is a 50 y.o. year old female with a history of schizophrenia/impulse disorder/intellectual disability on Clozapine PTA. Continuing this medication order as an inpatient requires that monitoring parameters per REMS requirements must be met.   Clozapine REMS Dispense Authorization was obtained, and will dispense inpatient.  RDA code [id number].  Verified Clozapine dose: 400mg  qhs  Last ANC value and date reported on the Clozapine REMS website: 10/07/2021 ANC monitoring frequency: every week Next ANC reporting is due on (date) 10/14/2021.  ,  Trefz, PharmD 10/07/2021, 11:42 PM   

## 2021-10-07 NOTE — ED Triage Notes (Signed)
Pt BIB [facility] from [facility] with reports of nausea and vomiting. When asking patient is she has any pain she replies "I don't know."

## 2021-10-07 NOTE — H&P (Addendum)
History and Physical    Patient: Margaret [name] OZH:[id number] DOB: [birth year] DOA: 10/07/2021 DOS: the patient was seen and examined on 10/07/2021 PCP: Dettinger, Fransisca Kaufmann, MD  Patient coming from: SNF  Chief Complaint:  Chief Complaint  Patient presents with   Emesis   HPI: Margaret [name] is a 51 y.o. female with medical history significant of schizophrenia/impulse disorder,/intellectual disability, history of tuberculosis as a child who is a resident of  [facility] who presents to the emergency department via EMS due to abdominal pain, chest pain, nausea and vomiting.  Patient was unable to provide a history at bedside possibly due to underlying disability.  History was obtained from ED PA and ED medical record.  Per report, patient was noted to have several episodes of vomiting around noon today and she complained of right abdominal pain and periumbilical pain as well as pain going into her chest.  At baseline, patient takes Mucinex due to spitting regularly.  Since patient was diagnosed of pneumonia the last time she had similar presentation, EMS was activated and patient was taken to the ED for further evaluation and management.  ED Course:  In the emergency department, WBC was elevated (chronic), thrombocytosis, normocytic anemia.  Troponin x2 was negative, lipase 52, lactic acid 1.1, blood culture pending. CT abdomen and pelvis with contrast 1. New centrally cavitary consolidation in the right middle lobe measuring 5.7 cm is suspicious for cavitary pneumonia. 2. Additional multifocal nodular consolidations in the bilateral lung bases with bronchial wall thickening and mucous plugging appears slightly improved from prior chest CT. 3. Prominent fluid-filled loops of small bowel without abrupt transition may reflect enteritis. 4. Small volume of pelvic free fluid is favored reactive or physiologic. Chest x-ray showed COPD changes with new right middle lobe pneumonia Pulmonary/critical  care Dr. Melvyn Novas was consulted and recommended admitting the patient here at [facility] and to start Unasyn and that he will consult on patient tomorrow.  Patient was treated with Unasyn, Zofran was, IV hydration was provided.  Hospitalist was asked to admit patient for further evaluation and management.  Review of Systems: Review of systems as noted in the HPI. All other systems reviewed and are negative.   Past Medical History:  Diagnosis Date   Hematuria 11/12/2014   Hepatitis B carrier [facility])    Hypothyroidism    Impulse disorder    IUD (intrauterine device) in place 11/12/2014   [facility] 12/[facility]/12 [facility]   Mild intellectual disability    Schizophrenia ([place])    Schizophrenia ([place])    Tuberculosis    as a child   Urinary frequency 11/12/2014   Past Surgical History:  Procedure Laterality Date   ARM WOUND REPAIR / CLOSURE     TRACHEAL SURGERY     TRACHEOSTOMY      Social History:  reports that she has never smoked. She has never been exposed to tobacco smoke. She has never used smokeless tobacco. She reports that she does not drink alcohol and does not use drugs.   Allergies  Allergen Reactions   Lactose Nausea And Vomiting    GI upset   Olanzapine     Other reaction(s): UNKNOWN   Tuberculin, Ppd     Other reaction(s): UNKNOWN   Lac Bovis Nausea And Vomiting    Other reaction(s): GI upset    Family History  Adopted: Yes  Family history unknown: Yes     Prior to Admission medications   Medication Sig Start Date End Date Taking? Authorizing Provider  Amantadine HCl 100 MG tablet Take 100 mg by mouth every morning. 02/22/21  Yes [provider]  Cholecalciferol (VITAMIN D3) 50 MCG (2000 UT) capsule TAKE 1 CAPSULE BY MOUTH EVERY DAY 07/07/21  Yes Dettinger, Fransisca Kaufmann, MD  clozapine (CLOZARIL) 200 MG tablet Take 400 mg by mouth at bedtime. 10/07/21  Yes [provider]  entecavir (BARACLUDE) 0.5 MG tablet TAKE (1) TABLET BY MOUTH ONCE DAILY. Patient taking differently:  Take 0.5 mg by mouth daily. 09/02/21  Yes Rehman, Mechele Dawley, MD  Fish Oil-Cholecalciferol (FISH OIL-VITAMIN D) 1200-1000 MG-UNIT CAPS TAKE 1 CAPSULE BY MOUTH TWICE DAILY. 09/28/21  Yes Dettinger, Fransisca Kaufmann, MD  FLUoxetine (PROZAC) 20 MG capsule TAKE 3 CAPSULES BY MOUTH ONCE DAILY. 12/03/20  Yes Dettinger, Fransisca Kaufmann, MD  fluticasone (FLONASE) 50 MCG/ACT nasal spray SPRAY 2 SPRAYS IN RIGHT NOSTRIL ONLY TWICE DAILY. Patient taking differently: 2 sprays See admin instructions. 2 sprays into right nostril only twice daily 09/06/21  Yes Dettinger, Fransisca Kaufmann, MD  GOODSENSE CLEARLAX 17 GM/SCOOP powder TAKE 17G (1 CAPFUL) TWICE A DAY Patient taking differently: Take 0.5 Containers by mouth 2 (two) times daily. 09/07/21  Yes Rehman, Mechele Dawley, MD  loratadine (CLARITIN) 10 MG tablet Take 1 tablet (10 mg total) by mouth daily. 06/22/21  Yes Margaretha Seeds, MD  mirtazapine (REMERON) 15 MG tablet Take 15 mg by mouth at bedtime. 02/22/21  Yes [provider]  Skin Protectants, Misc. (MINERIN CREME) CREA APPLY MODERATE AMOUNT TOPICALLY TO DRY SKIN ON SOLES & HEELS OF BOTH FEET AFTER SHOWER. (NURSING STAFF TO SUPERVISE APPLICATION) Patient taking differently: 1 application  See admin instructions. Apply moderate amount topically to dry skin on soles and heels of both feet after shower 01/14/21  Yes Dettinger, Fransisca Kaufmann, MD  Tiotropium Bromide Monohydrate (SPIRIVA RESPIMAT) 1.25 MCG/ACT AERS Inhale 2 puffs into the lungs daily. 06/22/21  Yes Margaretha Seeds, MD  vitamin E 200 UNIT capsule Take 1 capsule (200 Units total) by mouth daily. 07/14/21  Yes Dettinger, Fransisca Kaufmann, MD  fluticasone-salmeterol (ADVAIR HFA) 622-29 MCG/ACT inhaler Inhale 2 puffs into the lungs 2 (two) times daily. Patient not taking: Reported on 10/07/2021 09/27/21   Margaretha Seeds, MD  Gauze Pads & Dressings (STERILE GAUZE) 3"X3" PADS 2 packets by Does not apply route daily. 04/22/21   Margaretha Seeds, MD  guaiFENesin (MUCINEX) 600 MG 12 hr tablet  Take 1 tablet (600 mg total) by mouth 2 (two) times daily. 03/01/21 03/01/22  Martyn Ehrich, NP  levonorgestrel (MIRENA) 20 MCG/24HR IUD 1 each by Intrauterine route once.    [provider]    Physical Exam: BP 94/62 (BP Location: Right Arm)   Pulse 67   Temp 98.3 F (36.8 C) (Oral)   Resp 18   Ht 5' 3"  (1.6 m)   Wt 48.3 kg   SpO2 99%   BMI 18.87 kg/m   General: 51 y.o. year-old female well developed ill appearing, but in no acute distress.   HEENT: NCAT, EOMI Neck: Supple, trachea medial Cardiovascular: Regular rate and rhythm with no rubs or gallops.  No thyromegaly or JVD noted.  No lower extremity edema. 2/4 pulses in all 4 extremities. Respiratory: Rhonchi in right middle and lower lobes on auscultation. No wheezes  Abdomen: Soft, tender to palpation in the epigastric area.  Nondistended with normal bowel sounds x4 quadrants. Muskuloskeletal: No cyanosis, clubbing or edema noted bilaterally Neuro: strength 5/5 x 4, sensation, reflexes intact Skin: No ulcerative lesions  noted or rashes Psychiatry:  Mood is appropriate for condition and setting          Labs on Admission:  Basic Metabolic Panel: Recent Labs  Lab 10/07/21 1505  NA 135  K 4.5  CL 99  CO2 24  GLUCOSE 104*  BUN 17  CREATININE 0.62  CALCIUM 8.9   Liver Function Tests: Recent Labs  Lab 10/07/21 1505  AST 20  ALT 14  ALKPHOS 104  BILITOT 0.3  PROT 7.8  ALBUMIN 3.1*   Recent Labs  Lab 10/07/21 1505  LIPASE 52*   No results for input(s): "AMMONIA" in the last 168 hours. CBC: Recent Labs  Lab 10/07/21 1505 10/07/21 2245  WBC 20.9* 20.7*  NEUTROABS 16.5*  --   HGB 10.8* 9.7*  HCT 35.3* 30.7*  MCV 85.1 82.7  PLT 453* 351   Cardiac Enzymes: No results for input(s): "CKTOTAL", "CKMB", "CKMBINDEX", "TROPONINI" in the last 168 hours.  BNP (last 3 results) Recent Labs    02/01/21 2146  BNP 70.2    ProBNP (last 3 results) No results for input(s): "PROBNP" in the last  8760 hours.  CBG: No results for input(s): "GLUCAP" in the last 168 hours.  Radiological Exams on Admission: CT ABDOMEN PELVIS W CONTRAST  Result Date: 10/07/2021 CLINICAL DATA:  Acute abdominal pain with nausea and vomiting. EXAM: CT ABDOMEN AND PELVIS WITH CONTRAST TECHNIQUE: Multidetector CT imaging of the abdomen and pelvis was performed using the standard protocol following bolus administration of intravenous contrast. RADIATION DOSE REDUCTION: This exam was performed according to the departmental dose-optimization program which includes automated exposure control, adjustment of the mA and/or kV according to patient size and/or use of iterative reconstruction technique. CONTRAST:  23m OMNIPAQUE IOHEXOL 300 MG/ML  SOLN COMPARISON:  Chest CT June 11, 2021, MRI pelvis April 10, 2020, and CT abdomen pelvis January 20, 2021 FINDINGS: Despite efforts by the technologist and patient, motion artifact is present on today's exam and could not be eliminated. This reduces exam sensitivity and specificity. Lower chest: New centrally cavitary consolidation in the right middle lobe measuring 5.7 cm on image 2/5. Additional multifocal nodular consolidations in the bilateral lung bases with bronchial wall thickening and mucous plugging appears slightly improved from prior chest CT. Hepatobiliary: No suspicious hepatic lesion. Gallbladder is dilated without wall thickening or pericholecystic fluid. No biliary ductal dilation. Pancreas: No pancreatic ductal dilation or evidence of acute inflammation. Spleen: No splenomegaly or focal splenic lesion. Adrenals/Urinary Tract: No masses identified. No hydronephrosis. Urinary bladder is unremarkable for degree of distension. Stomach/Bowel: No radiopaque enteric contrast material was administered. Stomach is unremarkable for degree of distension. Prominent fluid-filled loops of small bowel without abrupt transition. Moderate volume of formed stool throughout the colon  suggestive of constipation. Vascular/Lymphatic: Normal caliber abdominal aorta. No pathologically enlarged abdominal or pelvic lymph nodes. Reproductive: Intrauterine device in place. No suspicious adnexal mass. Other: Small volume of pelvic free fluid Musculoskeletal: No acute osseous abnormality. IMPRESSION: 1. New centrally cavitary consolidation in the right middle lobe measuring 5.7 cm is suspicious for cavitary pneumonia. 2. Additional multifocal nodular consolidations in the bilateral lung bases with bronchial wall thickening and mucous plugging appears slightly improved from prior chest CT. 3. Prominent fluid-filled loops of small bowel without abrupt transition may reflect enteritis. 4. Small volume of pelvic free fluid is favored reactive or physiologic. Electronically Signed   By: JDahlia BailiffM.D.   On: 10/07/2021 17:30   DG Chest 2 View  Result Date: 10/07/2021 CLINICAL  DATA:  Chest pain with nausea and vomiting EXAM: CHEST - 2 VIEW COMPARISON:  05/20/2021 FINDINGS: Normal heart size, mediastinal contours, and pulmonary vascularity. Emphysematous and bronchitic changes consistent with COPD. Extensive parenchymal scarring LEFT apex. Chronic interstitial prominence with new infiltrate RIGHT middle lobe consistent with pneumonia. No pleural effusion or pneumothorax. Osseous demineralization with multiple old LEFT rib fractures and prior LEFT clavicular ORIF. IMPRESSION: COPD changes with new RIGHT middle lobe pneumonia. Followup PA and lateral chest X-ray is recommended in 3-4 weeks following trial of antibiotic therapy to ensure resolution and exclude underlying malignancy. Emphysema (ICD10-J43.9). Electronically Signed   By: Lavonia Dana M.D.   On: 10/07/2021 17:16    EKG: I independently viewed the EKG done and my findings are as followed: Normal sinus rhythm at a rate of 76 bpm  Assessment/Plan Present on Admission:  Schizophrenia ([place])  Principal Problem:   Cavitary pneumonia Active  Problems:   Schizophrenia ([place])   Leukocytosis   Aspiration pneumonia ([facility])   Thrombocytosis   Abdominal pain   Elevated lipase   Normocytic anemia   Nausea and vomiting  Cavitary pneumonia possibly due to aspiration Patient was started on Unasyn and azithromycin, we shall continue same at this time with plan to de-escalate/discontinue based on blood culture, sputum culture, urine Legionella, strep pneumo and procalcitonin Continue Tylenol as needed Continue Mucinex, incentive spirometry, flutter valve  Patient will be placed n.p.o. pending swallow eval being done in the morning Pulmonology was consulted and will see patient in the morning per ED DA  Abdominal pain (?GERD), nausea and vomiting Continue protonix Continue Zofran as needed  Chronic leukocytosis WBC 20.9, continue to monitor WBC with morning labs  Thrombocytosis possibly reactive Platelets 453, continue to monitor platelet levels with morning labs  Normocytic anemia Hemoglobin at 10.8, hemoglobin is within baseline range Continue to monitor H/H with morning labs  Elevated lipase possibly reactive Lipase 52, this does not appear to be pancreatitis Continue to treat as described for abdominal pain  Schizophrenia,  intellectual disability Patient is a group home resident.   She presented with persistent arhythmic movement of her left upper extremity, this appears to be chronic in nature  Continue clozapine, Prozac   DVT prophylaxis: Lovenox  Code Status: Full code  Consults: Pulmonology/critical care (by ED PA)  Family Communication: None at bedside  Severity of Illness: The appropriate patient status for this patient is INPATIENT. Inpatient status is judged to be reasonable and necessary in order to provide the required intensity of service to ensure the patient's safety. The patient's presenting symptoms, physical exam findings, and initial radiographic and laboratory data in the context of their chronic  comorbidities is felt to place them at high risk for further clinical deterioration. Furthermore, it is not anticipated that the patient will be medically stable for discharge from the hospital within 2 midnights of admission.   * I certify that at the point of admission it is my clinical judgment that the patient will require inpatient hospital care spanning beyond 2 midnights from the point of admission due to high intensity of service, high risk for further deterioration and high frequency of surveillance required.*  Author: Bernadette Hoit, DO 10/07/2021 11:11 PM  For on call review [email or web address].

## 2021-10-07 NOTE — ED Notes (Signed)
Lab called to check if they had second troponin, they did not. Will collect.

## 2021-10-07 NOTE — ED Notes (Signed)
Pt in ct 

## 2021-10-07 NOTE — Progress Notes (Signed)
Pharmacy Antibiotic Note  Margaret [name] is a 51 y.o. female admitted on 10/07/2021 with aspiration pneumonia.  Pharmacy has been consulted for Unasyn dosing.  Plan: Unasyn 3g IV Q6H.  Height: 5' 3"  (160 cm) Weight: 48.3 kg (106 lb 8 oz) IBW/kg (Calculated) : 52.4  Temp (24hrs), Avg:98.4 F (36.9 C), Min:97.9 F (36.6 C), Max:98.9 F (37.2 C)  Recent Labs  Lab 10/07/21 1505 10/07/21 1844  WBC 20.9*  --   CREATININE 0.62  --   LATICACIDVEN  --  1.1    Estimated Creatinine Clearance: 64.9 mL/min (by C-G formula based on SCr of 0.62 mg/dL).    Allergies  Allergen Reactions   Lactose Nausea And Vomiting    GI upset   Olanzapine     Other reaction(s): UNKNOWN   Tuberculin, Ppd     Other reaction(s): UNKNOWN   Lac Bovis Nausea And Vomiting    Other reaction(s): GI upset    Thank you for allowing pharmacy to be a part of this patient's care.  Wynona Neat, PharmD, BCPS  10/07/2021 10:50 PM

## 2021-10-07 NOTE — ED Provider Notes (Signed)
[facility] Provider Note   CSN: [id number] Arrival date & time: 10/07/21  1435     History  Chief Complaint  Patient presents with   Emesis    Margaret [name] is a 51 y.o. female.  The history is provided by the patient and a caregiver (group home manager, Ralph Dowdy). The history is limited by a developmental delay. No language interpreter was used.  Emesis Associated symptoms: abdominal pain         Margaret [name] is a 51 y.o. female past medical history of chronic hepatitis B, schizophrenia, emphysema and intellectual disability, she currently resides at [facility], who presents to the Emergency Department via EMS for evaluation of nausea vomiting, chest and abdominal pain.    I spoke with group home manager, Ralph Dowdy who provides most of patient's history.  States that around noon today patient was noted to have few episodes of vomiting and complained of right abdominal pain and periumbilical pain with pain into her chest.  Per group home manager, patient has baseline of constant spitting typically takes Mucinex for this but has not had medication in some time.  She is also noted to have some "lung issues" and is also followed by GI.  She states that when she had symptoms similar to this sometime ago she was diagnosed with pneumonia.  Patient does report some pain of her upper abdomen, but does not give any other additional history.    Home Medications Prior to Admission medications   Medication Sig Start Date End Date Taking? Authorizing Provider  acetaminophen (TYLENOL) 650 MG CR tablet Take 650 mg by mouth every 8 (eight) hours as needed for pain.    [provider]  albuterol (VENTOLIN HFA) 108 (90 Base) MCG/ACT inhaler INHALE 2 PUFFS EVERY 4 HOURS AS NEEDED FOR WHEEZING. 07/30/20   Dettinger, Fransisca Kaufmann, MD  Amantadine HCl 100 MG tablet Take 100 mg by mouth every morning. 02/22/21   [provider]   Cholecalciferol (VITAMIN D3) 50 MCG (2000 UT) capsule TAKE 1 CAPSULE BY MOUTH EVERY DAY 07/07/21   Dettinger, Fransisca Kaufmann, MD  cloZAPine (CLOZARIL) 100 MG tablet Take 200 mg by mouth at bedtime.    [provider]  entecavir (BARACLUDE) 0.5 MG tablet TAKE (1) TABLET BY MOUTH ONCE DAILY. 09/02/21   Rehman, Mechele Dawley, MD  Fish Oil-Cholecalciferol (FISH OIL-VITAMIN D) 1200-1000 MG-UNIT CAPS TAKE 1 CAPSULE BY MOUTH TWICE DAILY. 09/28/21   Dettinger, Fransisca Kaufmann, MD  FLUoxetine (PROZAC) 20 MG capsule TAKE 3 CAPSULES BY MOUTH ONCE DAILY. 12/03/20   Dettinger, Fransisca Kaufmann, MD  fluticasone (FLONASE) 50 MCG/ACT nasal spray SPRAY 2 SPRAYS IN RIGHT NOSTRIL ONLY TWICE DAILY. 09/06/21   Dettinger, Fransisca Kaufmann, MD  fluticasone-salmeterol (ADVAIR HFA) 841-66 MCG/ACT inhaler Inhale 2 puffs into the lungs 2 (two) times daily. 09/27/21   Margaretha Seeds, MD  Gauze Pads & Dressings (STERILE GAUZE) [phone]" PADS 2 packets by Does not apply route daily. 04/22/21   Margaretha Seeds, MD  GOODSENSE CLEARLAX 17 GM/SCOOP powder TAKE 17G (1 CAPFUL) TWICE A DAY 09/07/21   Rehman, Mechele Dawley, MD  guaiFENesin (MUCINEX) 600 MG 12 hr tablet Take 1 tablet (600 mg total) by mouth 2 (two) times daily. 03/01/21 03/01/22  Martyn Ehrich, NP  levonorgestrel (MIRENA) 20 MCG/24HR IUD 1 each by Intrauterine route once.    [provider]  loratadine (CLARITIN) 10 MG tablet Take 1 tablet (10 mg total) by mouth daily. 06/22/21  Margaretha Seeds, MD  mirtazapine (REMERON) 15 MG tablet Take 15 mg by mouth at bedtime. 02/22/21   [provider]  Skin Protectants, Misc. (MINERIN CREME) CREA APPLY MODERATE AMOUNT TOPICALLY TO DRY SKIN ON SOLES & HEELS OF BOTH FEET AFTER SHOWER. (NURSING STAFF TO SUPERVISE APPLICATION) Patient taking differently: 1 application  See admin instructions. Apply moderate amount topically to dry skin on soles and heels of both feet after shower 01/14/21   Dettinger, Fransisca Kaufmann, MD  Tiotropium Bromide Monohydrate  (SPIRIVA RESPIMAT) 1.25 MCG/ACT AERS Inhale 2 puffs into the lungs daily. 06/22/21   Margaretha Seeds, MD  Transparent Dressings (TEGADERM FILM [phone] MISC 1 each by Does not apply route daily. 04/22/21   Margaretha Seeds, MD  vitamin E 200 UNIT capsule Take 1 capsule (200 Units total) by mouth daily. 07/14/21   Dettinger, Fransisca Kaufmann, MD      Allergies    Lactose; Olanzapine; Tuberculin, ppd; and Lac bovis    Review of Systems   Review of Systems  Unable to perform ROS: Psychiatric disorder  Gastrointestinal:  Positive for abdominal pain and vomiting.    Physical Exam Updated Vital Signs BP (!) 141/100 (BP Location: Right Arm)   Pulse 92   Resp 15   SpO2 99%  Physical Exam Vitals and nursing note reviewed.  Constitutional:      Comments: Pt moving around on stretcher, actively spitting saliva into emesis bag  HENT:     Mouth/Throat:     Mouth: Mucous membranes are moist.  Cardiovascular:     Rate and Rhythm: Normal rate and regular rhythm.     Pulses: Normal pulses.  Pulmonary:     Effort: Pulmonary effort is normal. No respiratory distress.     Breath sounds: No wheezing or rales.  Abdominal:     General: There is no distension.     Palpations: Abdomen is soft. There is no mass.     Tenderness: There is abdominal tenderness.     Comments: Some tenderness to palpation of the epigastric area.  Abdomen is otherwise soft without distention or guarding.  Musculoskeletal:     Right lower leg: No edema.     Left lower leg: No edema.  Skin:    General: Skin is warm.     Capillary Refill: Capillary refill takes less than 2 seconds.  Neurological:     General: No focal deficit present.     Mental Status: She is alert.     Sensory: No sensory deficit.     Motor: No weakness.     ED Results / Procedures / Treatments   Labs (all labs ordered are listed, but only abnormal results are displayed) Labs Reviewed  CBC WITH DIFFERENTIAL/PLATELET - Abnormal; Notable for the following  components:      Result Value   WBC 20.9 (*)    Hemoglobin 10.8 (*)    HCT 35.3 (*)    Platelets 453 (*)    Neutro Abs 16.5 (*)    Monocytes Absolute 1.1 (*)    Abs Immature Granulocytes 0.11 (*)    All other components within normal limits  LIPASE, BLOOD - Abnormal; Notable for the following components:   Lipase 52 (*)    All other components within normal limits  COMPREHENSIVE METABOLIC PANEL - Abnormal; Notable for the following components:   Glucose, Bld 104 (*)    Albumin 3.1 (*)    All other components within normal limits  URINALYSIS, ROUTINE W REFLEX  MICROSCOPIC  LACTIC ACID, PLASMA  LACTIC ACID, PLASMA  TROPONIN I (HIGH SENSITIVITY)  TROPONIN I (HIGH SENSITIVITY)    EKG None  Radiology CT ABDOMEN PELVIS W CONTRAST  Result Date: 10/07/2021 CLINICAL DATA:  Acute abdominal pain with nausea and vomiting. EXAM: CT ABDOMEN AND PELVIS WITH CONTRAST TECHNIQUE: Multidetector CT imaging of the abdomen and pelvis was performed using the standard protocol following bolus administration of intravenous contrast. RADIATION DOSE REDUCTION: This exam was performed according to the departmental dose-optimization program which includes automated exposure control, adjustment of the mA and/or kV according to patient size and/or use of iterative reconstruction technique. CONTRAST:  45m OMNIPAQUE IOHEXOL 300 MG/ML  SOLN COMPARISON:  Chest CT June 11, 2021, MRI pelvis April 10, 2020, and CT abdomen pelvis January 20, 2021 FINDINGS: Despite efforts by the technologist and patient, motion artifact is present on today's exam and could not be eliminated. This reduces exam sensitivity and specificity. Lower chest: New centrally cavitary consolidation in the right middle lobe measuring 5.7 cm on image 2/5. Additional multifocal nodular consolidations in the bilateral lung bases with bronchial wall thickening and mucous plugging appears slightly improved from prior chest CT. Hepatobiliary: No suspicious  hepatic lesion. Gallbladder is dilated without wall thickening or pericholecystic fluid. No biliary ductal dilation. Pancreas: No pancreatic ductal dilation or evidence of acute inflammation. Spleen: No splenomegaly or focal splenic lesion. Adrenals/Urinary Tract: No masses identified. No hydronephrosis. Urinary bladder is unremarkable for degree of distension. Stomach/Bowel: No radiopaque enteric contrast material was administered. Stomach is unremarkable for degree of distension. Prominent fluid-filled loops of small bowel without abrupt transition. Moderate volume of formed stool throughout the colon suggestive of constipation. Vascular/Lymphatic: Normal caliber abdominal aorta. No pathologically enlarged abdominal or pelvic lymph nodes. Reproductive: Intrauterine device in place. No suspicious adnexal mass. Other: Small volume of pelvic free fluid Musculoskeletal: No acute osseous abnormality. IMPRESSION: 1. New centrally cavitary consolidation in the right middle lobe measuring 5.7 cm is suspicious for cavitary pneumonia. 2. Additional multifocal nodular consolidations in the bilateral lung bases with bronchial wall thickening and mucous plugging appears slightly improved from prior chest CT. 3. Prominent fluid-filled loops of small bowel without abrupt transition may reflect enteritis. 4. Small volume of pelvic free fluid is favored reactive or physiologic. Electronically Signed   By: JDahlia BailiffM.D.   On: 10/07/2021 17:30   DG Chest 2 View  Result Date: 10/07/2021 CLINICAL DATA:  Chest pain with nausea and vomiting EXAM: CHEST - 2 VIEW COMPARISON:  05/20/2021 FINDINGS: Normal heart size, mediastinal contours, and pulmonary vascularity. Emphysematous and bronchitic changes consistent with COPD. Extensive parenchymal scarring LEFT apex. Chronic interstitial prominence with new infiltrate RIGHT middle lobe consistent with pneumonia. No pleural effusion or pneumothorax. Osseous demineralization with  multiple old LEFT rib fractures and prior LEFT clavicular ORIF. IMPRESSION: COPD changes with new RIGHT middle lobe pneumonia. Followup PA and lateral chest X-ray is recommended in 3-4 weeks following trial of antibiotic therapy to ensure resolution and exclude underlying malignancy. Emphysema (ICD10-J43.9). Electronically Signed   By: MLavonia DanaM.D.   On: 10/07/2021 17:16    Procedures Procedures    Medications Ordered in ED Medications  sodium chloride 0.9 % bolus 500 mL (has no administration in time range)    ED Course/ Medical Decision Making/ A&P                           Medical Decision Making Patient here from group home,  unable to provide history given history of intellectual disability and schizophrenia.  History provided by group home manager.  Patient witnessed vomiting around noon today and complained of periumbilical pain.  Group home manager notes that she has "lung issues and is followed by pulmonology and by GI.  Frequent spitting at her baseline.  On arrival, patient only able to tell me that her stomach hurts "a little bit" spitting saliva into an emesis bag.  Vitals reassuring.  No significant abdominal pain on my exam.  No guarding or rebound.  Differential would include acute abdominal process, viral illness, gastritis, SBO.  Given history of lung concerns we will also obtain chest x-ray.  Amount and/or Complexity of Data Reviewed Labs: ordered.    Details: Labs interpreted by me, she has elevated white count of 20,000, hemoglobin 10.8 this is near baseline.  Lipase slightly elevated at 52.  Chemistries unremarkable.  Troponin 3 urine pending Radiology: ordered.    Details: Chest x-ray shows COPD changes with a right middle lobe pneumonia that is new.  CT abdomen and pelvis was also ordered for evaluation of nausea vomiting.  CT abdomen shows new centrally cavitary consolidation of the right middle lobe that is 5.7 cm and suspicious for cavitary pneumonia.  Prominent  fluid-filled loops of the small bowel without abrupt transition likely reflect enteritis ECG/medicine tests: ordered.    Details: EKG shows normal sinus rhythm Discussion of management or test interpretation with external provider(s): Discussed findings with pulmonary/critical care, Dr. Melvyn Novas who recommends patient be admitted here, start Unasyn and he will consult tomorrow.  Discussed findings with [facility] hospitalist, Dr. Josephine Cables who is agreeable to admit.           Final Clinical Impression(s) / ED Diagnoses Final diagnoses:  Cavitary pneumonia    Rx / DC Orders ED Discharge Orders     None         Bufford Lope 10/07/21 1929    Milton Ferguson, MD 10/08/21 1758

## 2021-10-08 DIAGNOSIS — J189 Pneumonia, unspecified organism: Secondary | ICD-10-CM | POA: Diagnosis not present

## 2021-10-08 DIAGNOSIS — J690 Pneumonitis due to inhalation of food and vomit: Secondary | ICD-10-CM | POA: Diagnosis not present

## 2021-10-08 DIAGNOSIS — R1010 Upper abdominal pain, unspecified: Secondary | ICD-10-CM | POA: Diagnosis not present

## 2021-10-08 DIAGNOSIS — D72829 Elevated white blood cell count, unspecified: Secondary | ICD-10-CM | POA: Diagnosis not present

## 2021-10-08 LAB — URINALYSIS, ROUTINE W REFLEX MICROSCOPIC
Bilirubin Urine: NEGATIVE
Glucose, UA: NEGATIVE mg/dL
Hgb urine dipstick: NEGATIVE
Ketones, ur: NEGATIVE mg/dL
Leukocytes,Ua: NEGATIVE
Nitrite: NEGATIVE
Protein, ur: NEGATIVE mg/dL
Specific Gravity, Urine: 1.035 — ABNORMAL HIGH (ref 1.005–1.030)
pH: 6 (ref 5.0–8.0)

## 2021-10-08 LAB — COMPREHENSIVE METABOLIC PANEL
ALT: 12 U/L (ref 0–44)
AST: 14 U/L — ABNORMAL LOW (ref 15–41)
Albumin: 2.5 g/dL — ABNORMAL LOW (ref 3.5–5.0)
Alkaline Phosphatase: 89 U/L (ref 38–126)
Anion gap: 6 (ref 5–15)
BUN: 11 mg/dL (ref 6–20)
CO2: 23 mmol/L (ref 22–32)
Calcium: 8.1 mg/dL — ABNORMAL LOW (ref 8.9–10.3)
Chloride: 111 mmol/L (ref 98–111)
Creatinine, Ser: 0.53 mg/dL (ref 0.44–1.00)
GFR, Estimated: >60 mL/min (ref >60)
Glucose, Bld: 77 mg/dL (ref 70–99)
Potassium: 3.5 mmol/L (ref 3.5–5.1)
Sodium: 140 mmol/L (ref 135–145)
Total Bilirubin: 0.4 mg/dL (ref 0.3–1.2)
Total Protein: 6.2 g/dL — ABNORMAL LOW (ref 6.5–8.1)

## 2021-10-08 LAB — PROCALCITONIN: Procalcitonin: <0.1 ng/mL

## 2021-10-08 LAB — CBC
HCT: 36.1 % (ref 36.0–46.0)
Hemoglobin: 10.6 g/dL — ABNORMAL LOW (ref 12.0–15.0)
MCH: 25.7 pg — ABNORMAL LOW (ref 26.0–34.0)
MCHC: 29.4 g/dL — ABNORMAL LOW (ref 30.0–36.0)
MCV: 87.6 fL (ref 80.0–100.0)
Platelets: 303 10*3/uL (ref 150–400)
RBC: 4.12 MIL/uL (ref 3.87–5.11)
RDW: 14.9 % (ref 11.5–15.5)
WBC: 15 10*3/uL — ABNORMAL HIGH (ref 4.0–10.5)
nRBC: 0 % (ref 0.0–0.2)

## 2021-10-08 LAB — STREP PNEUMONIAE URINARY ANTIGEN: Strep Pneumo Urinary Antigen: NEGATIVE

## 2021-10-08 LAB — MAGNESIUM: Magnesium: 2.1 mg/dL (ref 1.7–2.4)

## 2021-10-08 LAB — PHOSPHORUS: Phosphorus: 3.5 mg/dL (ref 2.5–4.6)

## 2021-10-08 LAB — APTT: aPTT: 37 seconds — ABNORMAL HIGH (ref 24–36)

## 2021-10-08 MED ORDER — SODIUM CHLORIDE 0.9 % IV BOLUS
500.0000 mL | Freq: Once | INTRAVENOUS | Status: AC
  Administered 2021-10-08: 500 mL via INTRAVENOUS

## 2021-10-08 MED ORDER — PANTOPRAZOLE SODIUM 40 MG IV SOLR
40.0000 mg | Freq: Every day | INTRAVENOUS | Status: DC
  Administered 2021-10-08: 40 mg via INTRAVENOUS
  Filled 2021-10-08: qty 10

## 2021-10-08 NOTE — Progress Notes (Signed)
10/08/21 0137 10/08/21 0148 10/08/21 0153  Assess: MEWS Score  Temp 97.8 F (36.6 C)  --   --   BP (!) 81/53 (!) 82/45 (!) 80/50  MAP (mmHg) (!) 62 (!) 57  --   Pulse Rate 75 73  --   Resp 18  --   --   Level of Consciousness  --   --   --   SpO2 98 %  --   --   O2 Device Room Air  --   --   Assess: MEWS Score  MEWS Temp 0 0 0  MEWS Systolic 1 1 2   MEWS Pulse 0 0 0  MEWS RR 0 0 0  MEWS LOC 0 0 0  MEWS Score 1 1 2   MEWS Score Color Ailene Ards Yellow  Notify: Provider  Provider Name/Title  --  Dr. Orlin Hilding Dr. Orlin Hilding  Date Provider Notified  --  10/08/21 10/08/21  Time Provider Notified  --  [phone]  Method of Notification  --   (amion)  (amion)  Notification Reason  --  Other (Comment) (BP low) Other (Comment) (Bp manual 80/50)  Provider response  --  Other (Comment) (request manual)  --   Date of Provider Response  --  10/08/21  --   Time of Provider Response  --  0149  --   Assess: SIRS CRITERIA  SIRS Temperature  0 0  --   SIRS Pulse 0 0  --   SIRS Respirations  0 0  --   SIRS WBC 0 1  --   SIRS Score Sum  0 1  --     10/08/21 0200 10/08/21 0205 10/08/21 0217  Assess: MEWS Score  Temp  --   --   --   BP (!) 84/50 (!) 82/48 (!) 86/52  MAP (mmHg)  --  (!) 59 (!) 63  Pulse Rate  --  69 72  Resp  --   --   --   Level of Consciousness Alert Alert Alert  SpO2  --   --   --   O2 Device  --   --   --   Assess: MEWS Score  MEWS Temp 0 0 0  MEWS Systolic 1 1 1   MEWS Pulse 0 0 0  MEWS RR 0 0 0  MEWS LOC 0 0 0  MEWS Score 1 1 1   MEWS Score Color Rica Mast  Notify: Provider  Provider Name/Title  --   --   --   Date Provider Notified  --   --   --   Time Provider Notified  --   --   --   Method of Notification  --   --   --   Notification Reason  --   --   --   Provider response  --   --   --   Date of Provider Response  --   --   --   Time of Provider Response  --   --   --   Assess: SIRS CRITERIA  SIRS Temperature   --  0 0  SIRS Pulse  --  0 0   SIRS Respirations   --  0 0  SIRS WBC  --  0 0  SIRS Score Sum   --  0 0    10/08/21 0231 10/08/21 0234  Assess: MEWS Score  Temp  --   --   BP (!) 83/50 (!) 85/54  MAP (mmHg) (!) 61 65  Pulse Rate 67 67  Resp  --   --   Level of Consciousness  --   --   SpO2  --   --   O2 Device  --   --   Assess: MEWS Score  MEWS Temp 0 0  MEWS Systolic 1 1  MEWS Pulse 0 0  MEWS RR 0 0  MEWS LOC 0 0  MEWS Score 1 1  MEWS Score Color Ailene Ards  Notify: Provider  Provider Name/Title  --   --   Date Provider Notified  --   --   Time Provider Notified  --   --   Method of Notification  --   --   Notification Reason  --   --   Provider response  --   --   Date of Provider Response  --   --   Time of Provider Response  --   --   Assess: SIRS CRITERIA  SIRS Temperature  0 0  SIRS Pulse 0 0  SIRS Respirations  0 0  SIRS WBC 0 0  SIRS Score Sum  0 0

## 2021-10-08 NOTE — Plan of Care (Signed)
  Problem: Acute Rehab PT Goals(only PT should resolve) Goal: Patient Will Transfer Sit To/From Stand Outcome: Progressing Flowsheets (Taken 10/08/2021 1436) Patient will transfer sit to/from stand: Independently Goal: Pt Will Transfer Bed To Chair/Chair To Bed Outcome: Progressing Flowsheets (Taken 10/08/2021 1436) Pt will Transfer Bed to Chair/Chair to Bed: Independently Goal: Pt Will Ambulate Outcome: Progressing Flowsheets (Taken 10/08/2021 1436) Pt will Ambulate:  > 125 feet  Independently Goal: Pt/caregiver will Perform Home Exercise Program Outcome: Progressing Flowsheets (Taken 10/08/2021 1436) Pt/caregiver will Perform Home Exercise Program:  For increased strengthening  For improved balance  Independently  2:37 PM, 10/08/21 Mearl Latin PT, DPT Physical Therapist at [facility]

## 2021-10-08 NOTE — Plan of Care (Signed)
Pt alert to self. Alertness improved since arrival to unit. Pt using complete sentences at times to questions. Pt became hypotensive and bolus given. Bp improved at this time Sbp in the 90's at this time.  Problem: Education: Goal: Knowledge of General Education information will improve Description: Including pain rating scale, medication(s)/side effects and non-pharmacologic comfort measures Outcome: Progressing   Problem: Health Behavior/Discharge Planning: Goal: Ability to manage health-related needs will improve Outcome: Progressing   Problem: Clinical Measurements: Goal: Ability to maintain clinical measurements within normal limits will improve Outcome: Progressing Goal: Will remain free from infection Outcome: Progressing Goal: Diagnostic test results will improve Outcome: Progressing Goal: Respiratory complications will improve Outcome: Progressing Goal: Cardiovascular complication will be avoided Outcome: Progressing   Problem: Activity: Goal: Risk for activity intolerance will decrease Outcome: Progressing   Problem: Nutrition: Goal: Adequate nutrition will be maintained Outcome: Progressing   Problem: Coping: Goal: Level of anxiety will decrease Outcome: Progressing   Problem: Elimination: Goal: Will not experience complications related to bowel motility Outcome: Progressing Goal: Will not experience complications related to urinary retention Outcome: Progressing   Problem: Pain Managment: Goal: General experience of comfort will improve Outcome: Progressing   Problem: Safety: Goal: Ability to remain free from injury will improve Outcome: Progressing   Problem: Skin Integrity: Goal: Risk for impaired skin integrity will decrease Outcome: Progressing

## 2021-10-08 NOTE — TOC Initial Note (Signed)
Transition of Care [facility]) - Initial/Assessment Note    Patient Details  Name: Margaret [name] MRN: [id number] Date of Birth: 10-02-1970  Transition of [facility]) CM/SW Contact:    Iona Beard, [place] Phone Number: 10/08/2021, 11:31 AM  Clinical Narrative:                 TOC updated that pt is from [place]. CSW spoke with pts legal guardian who states that plan will be for return to prior living at D/C.   CSW spoke with Erlene Senters from the [facility] who states that pt will need to be assessed by her staff prior to return. CSW explained that MD plans for possible D/C Sunday. Facility understands and will send someone out to assessed pt over the weekend. CSW provided weekend CSW phone number so they can update once they have completed assessment. CSW updated MD and RN of this. RN and PT will work with pt to mobilize. TOC to follow.    Expected Discharge Plan: Group Home Barriers to Discharge: Continued Medical Work up   Patient Goals and CMS Choice Patient states their goals for this hospitalization and ongoing recovery are:: return home CMS Medicare.gov Compare Post Acute Care list provided to:: Legal Guardian Choice offered to / list presented to : [place] / Guardian  Expected Discharge Plan and Services Expected Discharge Plan: Group Home In-house Referral: Clinical Social Work     Living arrangements for the past 2 months: Group Home                                      Prior Living Arrangements/Services Living arrangements for the past 2 months: Group Home Lives with:: Facility Resident Patient language and need for interpreter reviewed:: Yes Do you feel safe going back to the place where you live?: Yes      Need for Family Participation in Patient Care: Yes (Comment) Care giver support system in place?: Yes (comment)   Criminal Activity/Legal Involvement Pertinent to Current Situation/Hospitalization: No - Comment as needed  Activities of Daily  Living      Permission Sought/Granted                  Emotional Assessment Appearance:: Appears stated age       Alcohol / Substance Use: Not Applicable Psych Involvement: No (comment)  Admission diagnosis:  Cavitary pneumonia [J18.9, J98.4] Patient Active Problem List   Diagnosis Date Noted   Cavitary pneumonia 10/07/2021   Thrombocytosis 10/07/2021   Abdominal pain 10/07/2021   Elevated lipase 10/07/2021   Normocytic anemia 10/07/2021   Nausea and vomiting 10/07/2021   Constipation 07/06/2021   Aspiration pneumonia ([place]) 03/03/2021   Intellectual disability 03/03/2021   Right adrenal mass ([place]) 02/24/2021   Protein-calorie malnutrition, severe 02/10/2021   Centrilobular emphysema ([place]) 02/01/2021   Leukocytosis 01/30/2021   Complex ovarian cyst 01/25/2021   Multiple pulmonary nodules 01/25/2021   Vitamin D deficiency 11/30/2020   Weight loss 03/19/2019   Schizophrenia ([place])    Hyperlipidemia 12/18/2014   History of tuberculosis 11/17/2014   Orthostatic hypotension 11/17/2014   IUD (intrauterine device) in place 11/12/2014   Chronic hepatitis B ([place]) 11/10/2014   PCP:  Dettinger, Fransisca Kaufmann, MD Pharmacy:   [facility], [place] [address] Phone: [phone] Fax: [phone]  Social Determinants of Health (SDOH) Interventions    Readmission Risk Interventions    10/08/2021   11:26 AM  Readmission Risk Prevention Plan  Transportation Screening Complete  Home Care Screening Complete  Medication Review (RN CM) Complete

## 2021-10-08 NOTE — Evaluation (Signed)
Physical Therapy Evaluation Patient Details Name: Margaret [name] MRN: [id number] DOB: 1971-02-26 Today's Date: 10/08/2021  History of Present Illness  Lorri Fukuhara is a 51 y.o. female with medical history significant of schizophrenia/impulse disorder,/intellectual disability, history of tuberculosis as a child who is a resident of  [facility] who presents to the emergency department via EMS due to abdominal pain, chest pain, nausea and vomiting.  Patient was unable to provide a history at bedside possibly due to underlying disability.  History was obtained from ED PA and ED medical record.  Per report, patient was noted to have several episodes of vomiting around noon today and she complained of right abdominal pain and periumbilical pain as well as pain going into her chest.  At baseline, patient takes Mucinex due to spitting regularly.  Since patient was diagnosed of pneumonia the last time she had similar presentation, EMS was activated and patient was taken to the ED for further evaluation and management.   Clinical Impression  Patient with slight c/o dizziness upon initial transition to seated which improves some with increased sitting duration. Patient given min g with transfer to standing for safety/balance without loss of balance and no increase in dizziness noted. Patient ambulates initially with unilateral HHA due to apprehension and slight unsteadiness but is able to ambulate in hallways independently without loss of balance for long distance. Patient will benefit from continued skilled physical therapy in hospital and recommended venue below to increase strength, balance, endurance for safe ADLs and gait.        Recommendations for follow up therapy are one component of a multi-disciplinary discharge planning process, led by the attending physician.  Recommendations may be updated based on patient status, additional functional criteria and insurance authorization.  Follow Up  Recommendations No PT follow up      Assistance Recommended at Discharge PRN  Patient can return home with the following       Equipment Recommendations None recommended by PT  Recommendations for Other Services       Functional Status Assessment Patient has had a recent decline in their functional status and demonstrates the ability to make significant improvements in function in a reasonable and predictable amount of time.     Precautions / Restrictions Precautions Precautions: Fall Restrictions Weight Bearing Restrictions: No      Mobility  Bed Mobility Overal bed mobility: Independent                  Transfers Overall transfer level: Independent Equipment used: None               General transfer comment: min guard due to stated dizziness upon initial transition to seated but without change upon transfer to standing    Ambulation/Gait Ambulation/Gait assistance: Modified independent (Device/Increase time), Min guard Gait Distance (Feet): 900 Feet Assistive device: 1 person hand held assist, None Gait Pattern/deviations: Step-through pattern, Decreased stride length       General Gait Details: patient with intermittent unsteadiness initially and utilizes HHA due to apprehension. able to ambulate without AD or assist when asked to try. Ambulates 2 labs around all halls of unit  Stairs            Wheelchair Mobility    Modified Rankin (Stroke Patients Only)       Balance Overall balance assessment: Modified Independent  Pertinent Vitals/Pain Pain Assessment Pain Assessment: Faces Faces Pain Scale: Hurts a little bit Pain Location: stomach Pain Descriptors / Indicators: Aching Pain Intervention(s): Limited activity within patient's tolerance, Monitored during session, Repositioned    Home Living Family/patient expects to be discharged to:: Group home                         Prior Function Prior Level of Function : Independent/Modified Independent             Mobility Comments: Pt states she did everything for herself.       Hand Dominance        Extremity/Trunk Assessment   Upper Extremity Assessment Upper Extremity Assessment: Overall WFL for tasks assessed    Lower Extremity Assessment Lower Extremity Assessment: Overall WFL for tasks assessed    Cervical / Trunk Assessment Cervical / Trunk Assessment: Normal  Communication   Communication: No difficulties  Cognition Arousal/Alertness: Awake/alert Behavior During Therapy: WFL for tasks assessed/performed Overall Cognitive Status: Within Functional Limits for tasks assessed                                          General Comments      Exercises     Assessment/Plan    PT Assessment Patient needs continued PT services  PT Problem List Decreased mobility;Decreased activity tolerance;Decreased balance       PT Treatment Interventions Therapeutic activities;DME instruction;Gait training;Therapeutic exercise;Patient/family education;Stair training;Balance training;Functional mobility training;Neuromuscular re-education    PT Goals (Current goals can be found in the Care Plan section)  Acute Rehab PT Goals Patient Stated Goal: return home PT Goal Formulation: With patient Time For Goal Achievement: 10/11/21 Potential to Achieve Goals: Good    Frequency Min 2X/week     Co-evaluation               AM-PAC PT "6 Clicks" Mobility  Outcome Measure Help needed turning from your back to your side while in a flat bed without using bedrails?: None Help needed moving from lying on your back to sitting on the side of a flat bed without using bedrails?: None Help needed moving to and from a bed to a chair (including a wheelchair)?: None Help needed standing up from a chair using your arms (e.g., wheelchair or bedside chair)?: None Help needed to walk  in hospital room?: None Help needed climbing 3-5 steps with a railing? : None 6 Click Score: 24    End of Session   Activity Tolerance: Patient tolerated treatment well Patient left: in bed;with call bell/phone within reach;with bed alarm set Nurse Communication: Mobility status PT Visit Diagnosis: Other abnormalities of gait and mobility (R26.89)    Time: 6701-4103 PT Time Calculation (min) (ACUTE ONLY): 19 min   Charges:   PT Evaluation $PT Eval Low Complexity: 1 Low PT Treatments $Therapeutic Activity: 8-22 mins        2:36 PM, 10/08/21 Mearl Latin PT, DPT Physical Therapist at [facility]

## 2021-10-08 NOTE — Progress Notes (Signed)
PROGRESS NOTE   Margaret [name]  DJT:[id number] DOB: April 17, 1970 DOA: 10/07/2021 PCP: Dettinger, Fransisca Kaufmann, MD   Chief Complaint  Patient presents with   Emesis   Level of care: Telemetry  Brief Admission History:  51 y.o. female with medical history significant of schizophrenia/impulse disorder,/intellectual disability, history of tuberculosis as a child who is a resident of  [facility] who presents to the emergency department via EMS due to abdominal pain, chest pain, nausea and vomiting.  Patient was unable to provide a history at bedside possibly due to underlying disability.  History was obtained from ED PA and ED medical record.  Per report, patient was noted to have several episodes of vomiting around noon today and she complained of right abdominal pain and periumbilical pain as well as pain going into her chest.  At baseline, patient takes Mucinex due to spitting regularly.  Since patient was diagnosed of pneumonia the last time she had similar presentation, EMS was activated and patient was taken to the ED for further evaluation and management.   ED Course:  In the emergency department, WBC was elevated (chronic), thrombocytosis, normocytic anemia.  Troponin x2 was negative, lipase 52, lactic acid 1.1, blood culture pending. CT abdomen and pelvis with contrast 1. New centrally cavitary consolidation in the right middle lobe measuring 5.7 cm is suspicious for cavitary pneumonia. 2. Additional multifocal nodular consolidations in the bilateral lung bases with bronchial wall thickening and mucous plugging appears slightly improved from prior chest CT. 3. Prominent fluid-filled loops of small bowel without abrupt transition may reflect enteritis. 4. Small volume of pelvic free fluid is favored reactive or physiologic. Chest x-ray showed COPD changes with new right middle lobe pneumonia Pulmonary/critical care Dr. Melvyn Novas was consulted and recommended admitting the patient here at [facility] and  to start Unasyn and that he will consult on patient tomorrow.  Patient was treated with Unasyn, Zofran was, IV hydration was provided.  Hospitalist was asked to admit patient for further evaluation and management.     Assessment and Plan:  Cavitary pneumonia possibly due to aspiration Patient was started on Unasyn and azithromycin, we shall continue same at this time with plan to de-escalate/discontinue based on blood culture, sputum culture, urine Legionella, strep pneumo and procalcitonin Continue Tylenol as needed Continue Mucinex, incentive spirometry, flutter valve  SLP evaluation completed 7/7: reg, thin recommended   Abdominal pain (?GERD), nausea and vomiting Continue protonix Continue Zofran as needed   Chronic leukocytosis WBC trended down to 15 with treatments  Thrombocytosis REACTIVE Platelets 453>>303  Normocytic anemia Hemoglobin at 10.8, hemoglobin is within baseline range Continue to monitor H/H with morning labs  Elevated lipase possibly reactive Lipase 52, this does not appear to be pancreatitis Continue to treat as described for abdominal pain   Schizophrenia,  intellectual disability Patient is a group home resident.   She presented with persistent arhythmic movement of her left upper extremity, this appears to be chronic in nature  Continue clozapine, Prozac     DVT prophylaxis: Lovenox   Code Status: Full code  Family Communication: TOC reached out to group home  Disposition: Status is: Inpatient Remains inpatient appropriate because: IV antibiotics required    Consultants:  SLP  Procedures:   Antimicrobials:  Unasyn 7/6>> Azithromycin 7/6-7/7  Subjective: Pt voices no specific complaints.  Objective: Vitals:   10/08/21 0234 10/08/21 0346 10/08/21 0457 10/08/21 1441  BP: (!) 85/54 (!) 94/52 (!) 90/58 126/79  Pulse: 67 72 82 80  Resp:  16 19 18   Temp:   97.7 F (36.5 C) 98.2 F (36.8 C)  TempSrc:   Oral   SpO2:  100% 99% 100%   Weight:      Height:        Intake/Output Summary (Last 24 hours) at 10/08/2021 1653 Last data filed at 10/08/2021 1342 Gross per 24 hour  Intake 1036.06 ml  Output 510 ml  Net 526.06 ml   Filed Weights   10/07/21 2054  Weight: 48.3 kg   Examination:  General exam: Appears calm and comfortable  Respiratory system: shallow breath sounds today. Respiratory effort normal. Cardiovascular system: normal S1 & S2 heard. No JVD, murmurs, rubs, gallops or clicks. No pedal edema. Gastrointestinal system: Abdomen is nondistended, soft and nontender. No organomegaly or masses felt. Normal bowel sounds heard. Central nervous system: Alert and oriented. No focal neurological deficits. Extremities: Symmetric 5 x 5 power. Skin: No rashes, lesions or ulcers. Psychiatry: Judgement and insight appear diminished. Mood & affect appropriate.   Data Reviewed: I have personally reviewed following labs and imaging studies  CBC: Recent Labs  Lab 10/07/21 1505 10/07/21 2245 10/08/21 0546  WBC 20.9* 20.7* 15.0*  NEUTROABS 16.5*  --   --   HGB 10.8* 9.7* 10.6*  HCT 35.3* 30.7* 36.1  MCV 85.1 82.7 87.6  PLT 453* 351 973    Basic Metabolic Panel: Recent Labs  Lab 10/07/21 1505 10/07/21 2245 10/08/21 0546  NA 135  --  140  K 4.5  --  3.5  CL 99  --  111  CO2 24  --  23  GLUCOSE 104*  --  77  BUN 17  --  11  CREATININE 0.62 0.52 0.53  CALCIUM 8.9  --  8.1*  MG  --   --  2.1  PHOS  --   --  3.5    CBG: No results for input(s): "GLUCAP" in the last 168 hours.  Recent Results (from the past 240 hour(s))  Blood culture (routine x 2)     Status: None (Preliminary result)   Collection Time: 10/07/21  6:44 PM   Specimen: BLOOD RIGHT ARM  Result Value Ref Range Status   Specimen Description BLOOD RIGHT ARM  Final   Special Requests   Final    BOTTLES DRAWN AEROBIC AND ANAEROBIC Blood Culture adequate volume   Culture   Final    NO GROWTH < 24 HOURS Performed at [facility], [address], [place], [place] [address]    Report Status PENDING  Incomplete  Blood culture (routine x 2)     Status: None (Preliminary result)   Collection Time: 10/07/21  6:44 PM   Specimen: BLOOD LEFT ARM  Result Value Ref Range Status   Specimen Description BLOOD LEFT ARM  Final   Special Requests   Final    BOTTLES DRAWN AEROBIC AND ANAEROBIC Blood Culture adequate volume   Culture   Final    NO GROWTH < 24 HOURS Performed at [facility], [address], [place], [place] [address]    Report Status PENDING  Incomplete     Radiology Studies: CT ABDOMEN PELVIS W CONTRAST  Result Date: 10/07/2021 CLINICAL DATA:  Acute abdominal pain with nausea and vomiting. EXAM: CT ABDOMEN AND PELVIS WITH CONTRAST TECHNIQUE: Multidetector CT imaging of the abdomen and pelvis was performed using the standard protocol following bolus administration of intravenous contrast. RADIATION DOSE REDUCTION: This exam was performed according to the departmental dose-optimization program which includes automated  exposure control, adjustment of the mA and/or kV according to patient size and/or use of iterative reconstruction technique. CONTRAST:  72m OMNIPAQUE IOHEXOL 300 MG/ML  SOLN COMPARISON:  Chest CT June 11, 2021, MRI pelvis April 10, 2020, and CT abdomen pelvis January 20, 2021 FINDINGS: Despite efforts by the technologist and patient, motion artifact is present on today's exam and could not be eliminated. This reduces exam sensitivity and specificity. Lower chest: New centrally cavitary consolidation in the right middle lobe measuring 5.7 cm on image 2/5. Additional multifocal nodular consolidations in the bilateral lung bases with bronchial wall thickening and mucous plugging appears slightly improved from prior chest CT. Hepatobiliary: No suspicious hepatic lesion. Gallbladder is dilated without wall thickening or pericholecystic fluid. No biliary ductal dilation. Pancreas: No pancreatic ductal dilation or evidence of  acute inflammation. Spleen: No splenomegaly or focal splenic lesion. Adrenals/Urinary Tract: No masses identified. No hydronephrosis. Urinary bladder is unremarkable for degree of distension. Stomach/Bowel: No radiopaque enteric contrast material was administered. Stomach is unremarkable for degree of distension. Prominent fluid-filled loops of small bowel without abrupt transition. Moderate volume of formed stool throughout the colon suggestive of constipation. Vascular/Lymphatic: Normal caliber abdominal aorta. No pathologically enlarged abdominal or pelvic lymph nodes. Reproductive: Intrauterine device in place. No suspicious adnexal mass. Other: Small volume of pelvic free fluid Musculoskeletal: No acute osseous abnormality. IMPRESSION: 1. New centrally cavitary consolidation in the right middle lobe measuring 5.7 cm is suspicious for cavitary pneumonia. 2. Additional multifocal nodular consolidations in the bilateral lung bases with bronchial wall thickening and mucous plugging appears slightly improved from prior chest CT. 3. Prominent fluid-filled loops of small bowel without abrupt transition may reflect enteritis. 4. Small volume of pelvic free fluid is favored reactive or physiologic. Electronically Signed   By: JDahlia BailiffM.D.   On: 10/07/2021 17:30   DG Chest 2 View  Result Date: 10/07/2021 CLINICAL DATA:  Chest pain with nausea and vomiting EXAM: CHEST - 2 VIEW COMPARISON:  05/20/2021 FINDINGS: Normal heart size, mediastinal contours, and pulmonary vascularity. Emphysematous and bronchitic changes consistent with COPD. Extensive parenchymal scarring LEFT apex. Chronic interstitial prominence with new infiltrate RIGHT middle lobe consistent with pneumonia. No pleural effusion or pneumothorax. Osseous demineralization with multiple old LEFT rib fractures and prior LEFT clavicular ORIF. IMPRESSION: COPD changes with new RIGHT middle lobe pneumonia. Followup PA and lateral chest X-ray is recommended  in 3-4 weeks following trial of antibiotic therapy to ensure resolution and exclude underlying malignancy. Emphysema (ICD10-J43.9). Electronically Signed   By: MLavonia DanaM.D.   On: 10/07/2021 17:16    Scheduled Meds:  clozapine  400 mg Oral QHS   dextromethorphan-guaiFENesin  1 tablet Oral BID   enoxaparin (LOVENOX) injection  40 mg Subcutaneous Q24H   FLUoxetine  60 mg Oral Daily   pantoprazole (PROTONIX) IV  40 mg Intravenous Q24H   Continuous Infusions:  sodium chloride 50 mL/hr at 10/07/21 2259   ampicillin-sulbactam (UNASYN) IV 3 g (10/08/21 1642)   azithromycin 500 mg (10/07/21 2301)     LOS: 1 day   Time spent: 36 mins  Tredarius Cobern JWynetta Emery MD How to contact the TG[facility]oscopyAttending or Consulting provider [place] covering provider during after hours [place] for this patient?  Check the care team in [facility]and look for a) attending/consulting TR[facility] provider listed and b) the [facility]team listed Log into [email or web address] and use [place]'s universal password to access. If you do not have the password, please contact the hospital  operator. Locate the [facility] provider you are looking for under [facility] and page to a number that you can be directly reached. If you still have difficulty reaching the provider, please page the [facility] (Director on Call) for the Hospitalists listed on amion for assistance.  10/08/2021, 4:53 PM

## 2021-10-08 NOTE — Evaluation (Signed)
Clinical/Bedside Swallow Evaluation Patient Details  Name: Margaret [name] MRN: [id number] Date of Birth: 19-Feb-1971  Today's Date: 10/08/2021 Time: SLP Start Time (ACUTE ONLY): 1093 SLP Stop Time (ACUTE ONLY): 1536 SLP Time Calculation (min) (ACUTE ONLY): 22 min  Past Medical History:  Past Medical History:  Diagnosis Date   Hematuria 11/12/2014   Hepatitis B carrier ([place])    Hypothyroidism    Impulse disorder    IUD (intrauterine device) in place 11/12/2014   Inserted 12/24/12 at [facility]   Mild intellectual disability    Schizophrenia ([place])    Schizophrenia ([place])    Tuberculosis    as a child   Urinary frequency 11/12/2014   Past Surgical History:  Past Surgical History:  Procedure Laterality Date   ARM WOUND REPAIR / CLOSURE     TRACHEAL SURGERY     TRACHEOSTOMY     HPI:  Margaret [name] is a 51 y.o. female with medical history significant of schizophrenia/impulse disorder,/intellectual disability, history of tuberculosis as a child who is a resident of  [facility] who presents to the emergency department via EMS due to abdominal pain, chest pain, nausea and vomiting.  Patient was unable to provide a history at bedside possibly due to underlying disability.  History was obtained from ED PA and ED medical record.  Per report, patient was noted to have several episodes of vomiting around noon today and she complained of right abdominal pain and periumbilical pain as well as pain going into her chest.  At baseline, patient takes Mucinex due to spitting regularly.  Since patient was diagnosed of pneumonia the last time she had similar presentation, EMS was activated and patient was taken to the ED for further evaluation and management.    Assessment / Plan / Recommendation  Clinical Impression  Clinical swallowing evaluation completed while Pt was sitting upright in bed; Pt consumed regular, puree and thin trials without s/sx of oropharyngeal dysphagia however, note multiple blue bags  full of tissues and phelgm/ expectorated material. Clinical presentation seems similar today to BSE completed in 2021 where no immediate s/sx of aspiration were seen at bedside with trials, however, as questioned on that BSE this SLP questions post prandial aspiration or GERD/ esophageal dysphagia. Note she is being followed by GI, Barium Pill Esophagram completed 02/2021 revealed minimal narrowing at the gastroesophageal junction. Defer to GI for further diagnosis and treatment of eosphageal dysphagia. There are no further ST needs noted at this time. Our service will sign off. Thank you, SLP Visit Diagnosis: Dysphagia, unspecified (R13.10)    Aspiration Risk  Mild aspiration risk;Moderate aspiration risk    Diet Recommendation Regular;Thin liquid   Liquid Administration via: Cup;Straw Medication Administration: Whole meds with liquid Compensations: Minimize environmental distractions;Slow rate;Small sips/bites Postural Changes: Remain upright for at least 30 minutes after po intake;Seated upright at 90 degrees    Other  Recommendations Recommended Consults: Consider GI evaluation Oral Care Recommendations: Oral care BID    Recommendations for follow up therapy are one component of a multi-disciplinary discharge planning process, led by the attending physician.  Recommendations may be updated based on patient status, additional functional criteria and insurance authorization.  Follow up Recommendations No SLP follow up        [place] Date of Onset: 10/07/21 HPI: Margaret [name] is a 51 y.o. female with medical history significant of schizophrenia/impulse disorder,/intellectual disability, history of tuberculosis as a child who is a resident of  [facility] who presents to  the emergency department via EMS due to abdominal pain, chest pain, nausea and vomiting.  Patient was unable to provide a history at bedside possibly due to underlying disability.  History was  obtained from ED PA and ED medical record.  Per report, patient was noted to have several episodes of vomiting around noon today and she complained of right abdominal pain and periumbilical pain as well as pain going into her chest.  At baseline, patient takes Mucinex due to spitting regularly.  Since patient was diagnosed of pneumonia the last time she had similar presentation, EMS was activated and patient was taken to the ED for further evaluation and management. Type of Study: Bedside Swallow Evaluation Previous Swallow Assessment: BSE 2022 Diet Prior to this Study: Regular;Thin liquids;Dysphagia 1 (puree) Temperature Spikes Noted: No Respiratory Status: Room air History of Recent Intubation: No Behavior/Cognition: Alert;Cooperative;Pleasant mood Oral Cavity Assessment: Within Functional Limits Oral Cavity - Dentition: Adequate natural dentition Vision: Functional for self-feeding Self-Feeding Abilities: Able to feed self Patient Positioning: Upright in bed Baseline Vocal Quality: Normal Volitional Cough: Strong Volitional Swallow: Able to elicit    Oral/Motor/Sensory Function Overall Oral Motor/Sensory Function: Within functional limits   Ice Chips Ice chips: Within functional limits   Thin Liquid Thin Liquid: Within functional limits    Nectar Thick Nectar Thick Liquid: Not tested   Honey Thick Honey Thick Liquid: Not tested   Puree Puree: Within functional limits   Solid     Solid: Within functional limits     Margaret Dedman H. Roddie Mc, CCC-SLP Speech Language Pathologist  Margaret [name] 10/08/2021,3:39 PM

## 2021-10-08 NOTE — Progress Notes (Signed)
500 ml NS bolus given. Bp rechecked and MD updated.   10/08/21 0346  Assess: MEWS Score  BP (!) 94/52  MAP (mmHg) 65  Pulse Rate 72  Resp 16  SpO2 100 %  O2 Device Room Air  Assess: MEWS Score  MEWS Temp 0  MEWS Systolic 1  MEWS Pulse 0  MEWS RR 0  MEWS LOC 0  MEWS Score 1  MEWS Score Color Green  Notify: Provider  Provider Name/Title Dr. Orlin Hilding  Date Provider Notified 10/08/21  Time Provider Notified [phone]  Method of Notification  (amion)  Notification Reason Other (Comment) (update bp after bolus)  Provider response No new orders  Date of Provider Response 10/08/21  Time of Provider Response 0347  Assess: SIRS CRITERIA  SIRS Temperature  0  SIRS Pulse 0  SIRS Respirations  0  SIRS WBC 0  SIRS Score Sum  0

## 2021-10-09 DIAGNOSIS — K5000 Crohn's disease of small intestine without complications: Secondary | ICD-10-CM | POA: Diagnosis not present

## 2021-10-09 DIAGNOSIS — J189 Pneumonia, unspecified organism: Secondary | ICD-10-CM | POA: Diagnosis not present

## 2021-10-09 DIAGNOSIS — R1010 Upper abdominal pain, unspecified: Secondary | ICD-10-CM | POA: Diagnosis not present

## 2021-10-09 DIAGNOSIS — D72829 Elevated white blood cell count, unspecified: Secondary | ICD-10-CM | POA: Diagnosis not present

## 2021-10-09 MED ORDER — POLYETHYLENE GLYCOL 3350 17 GM/SCOOP PO POWD: 0.5000 | Freq: Two times a day (BID) | ORAL | Status: AC

## 2021-10-09 MED ORDER — AMOXICILLIN-POT CLAVULANATE 875-125 MG PO TABS: 1.0000 | ORAL_TABLET | Freq: Two times a day (BID) | ORAL | 0 refills | Status: AC

## 2021-10-09 MED ORDER — MINERIN CREME EX CREA: 1.0000 | TOPICAL_CREAM | CUTANEOUS | Status: AC

## 2021-10-09 MED ORDER — AMOXICILLIN-POT CLAVULANATE 875-125 MG PO TABS: 1.0000 | ORAL_TABLET | Freq: Two times a day (BID) | ORAL | 0 refills | Status: DC

## 2021-10-09 NOTE — Progress Notes (Signed)
Physical Therapy Treatment Patient Details Name: Margaret [name] MRN: [id number] DOB: Aug 02, 1970 Today's Date: 10/09/2021   History of Present Illness Margaret [name] is a 51 y.o. female with medical history significant of schizophrenia/impulse disorder,/intellectual disability, history of tuberculosis as a child who is a resident of  [facility] who presents to the emergency department via EMS due to abdominal pain, chest pain, nausea and vomiting.  Patient was unable to provide a history at bedside possibly due to underlying disability.  History was obtained from ED PA and ED medical record.  Per report, patient was noted to have several episodes of vomiting around noon today and she complained of right abdominal pain and periumbilical pain as well as pain going into her chest.  At baseline, patient takes Mucinex due to spitting regularly.  Since patient was diagnosed of pneumonia the last time she had similar presentation, EMS was activated and patient was taken to the ED for further evaluation and management.    PT Comments    Patient independent with all mobility today. She does demonstrate slight unsteadiness initially with ambulation which improves after several steps. Patient does not require additional PT services at this time. Patient discharged to care of nursing for ambulation daily as tolerated for length of stay.   Recommendations for follow up therapy are one component of a multi-disciplinary discharge planning process, led by the attending physician.  Recommendations may be updated based on patient status, additional functional criteria and insurance authorization.  Follow Up Recommendations  No PT follow up     Assistance Recommended at Discharge PRN  Patient can return home with the following     Equipment Recommendations  None recommended by PT    Recommendations for Other Services       Precautions / Restrictions Precautions Precautions: None Restrictions Weight  Bearing Restrictions: No     Mobility  Bed Mobility Overal bed mobility: Independent                  Transfers Overall transfer level: Independent Equipment used: None               General transfer comment: min guard due to stated dizziness upon initial transition to seated but without change upon transfer to standing    Ambulation/Gait Ambulation/Gait assistance: Independent Gait Distance (Feet): 900 Feet Assistive device: None Gait Pattern/deviations: Step-through pattern, Decreased stride length       General Gait Details: patient with intermittent unsteadiness initially which improves, ambualtes without AD   Stairs             Wheelchair Mobility    Modified Rankin (Stroke Patients Only)       Balance Overall balance assessment: Mild deficits observed, not formally tested                                          Cognition Arousal/Alertness: Awake/alert Behavior During Therapy: WFL for tasks assessed/performed Overall Cognitive Status: Within Functional Limits for tasks assessed                                          Exercises      General Comments        Pertinent Vitals/Pain Pain Assessment Pain Assessment: No/denies pain    Home Living  Prior Function            PT Goals (current goals can now be found in the care plan section) Acute Rehab PT Goals Patient Stated Goal: return home PT Goal Formulation: With patient Time For Goal Achievement: 10/11/21 Potential to Achieve Goals: Good Progress towards PT goals: Progressing toward goals    Frequency    Min 2X/week      PT Plan Current plan remains appropriate    Co-evaluation              AM-PAC PT "6 Clicks" Mobility   Outcome Measure  Help needed turning from your back to your side while in a flat bed without using bedrails?: None Help needed moving from lying on your back to  sitting on the side of a flat bed without using bedrails?: None Help needed moving to and from a bed to a chair (including a wheelchair)?: None Help needed standing up from a chair using your arms (e.g., wheelchair or bedside chair)?: None Help needed to walk in hospital room?: None Help needed climbing 3-5 steps with a railing? : None 6 Click Score: 24    End of Session   Activity Tolerance: Patient tolerated treatment well Patient left: in bed;with call bell/phone within reach;with nursing/sitter in room Nurse Communication: Mobility status PT Visit Diagnosis: Other abnormalities of gait and mobility (R26.89)     Time: 1046-1100 PT Time Calculation (min) (ACUTE ONLY): 14 min  Charges:  $Therapeutic Activity: 8-22 mins                     11:06 AM, 10/09/21 Mearl Latin PT, DPT Physical Therapist at [facility]

## 2021-10-09 NOTE — Plan of Care (Signed)
  Problem: Clinical Measurements: Goal: Ability to maintain clinical measurements within normal limits will improve Outcome: Progressing   

## 2021-10-09 NOTE — Plan of Care (Signed)

## 2021-10-09 NOTE — Discharge Instructions (Signed)
Please have a repeat chest xray done in 3-4 weeks to ensure resolution of pneumonia.     IMPORTANT INFORMATION: PAY CLOSE ATTENTION   PHYSICIAN DISCHARGE INSTRUCTIONS  Follow with Primary care provider  Dettinger, Fransisca Kaufmann, MD  and other consultants as instructed by your Hospitalist Physician  [place] IF SYMPTOMS COME BACK, WORSEN OR NEW PROBLEM DEVELOPS   Please note: You were cared for by a hospitalist during your hospital stay. Every effort will be made to forward records to your primary care provider.  You can request that your primary care provider send for your hospital records if they have not received them.  Once you are discharged, your primary care physician will handle any further medical issues. Please note that NO REFILLS for any discharge medications will be authorized once you are discharged, as it is imperative that you return to your primary care physician (or establish a relationship with a primary care physician if you do not have one) for your post hospital discharge needs so that they can reassess your need for medications and monitor your lab values.  Please get a complete blood count and chemistry panel checked by your Primary MD at your next visit, and again as instructed by your Primary MD.  Get Medicines reviewed and adjusted: Please take all your medications with you for your next visit with your Primary MD  Laboratory/radiological data: Please request your Primary MD to go over all hospital tests and procedure/radiological results at the follow up, please ask your primary care provider to get all Hospital records sent to his/her office.  In some cases, they will be blood work, cultures and biopsy results pending at the time of your discharge. Please request that your primary care provider follow up on these results.  If you are diabetic, please bring your blood sugar readings with you to your follow up appointment with primary  care.    Please call and make your follow up appointments as soon as possible.    Also Note the following: If you experience worsening of your admission symptoms, develop shortness of breath, life threatening emergency, suicidal or homicidal thoughts you must seek medical attention immediately by calling 911 or calling your MD immediately  if symptoms less severe.  You must read complete instructions/literature along with all the possible adverse reactions/side effects for all the Medicines you take and that have been prescribed to you. Take any new Medicines after you have completely understood and accpet all the possible adverse reactions/side effects.   Do not drive when taking Pain medications or sleeping medications (Benzodiazepines)  Do not take more than prescribed Pain, Sleep and Anxiety Medications. It is not advisable to combine anxiety,sleep and pain medications without talking with your primary care practitioner  Special Instructions: If you have smoked or chewed Tobacco  in the last 2 yrs please stop smoking, stop any regular Alcohol  and or any Recreational drug use.  Wear Seat belts while driving.  Do not drive if taking any narcotic, mind altering or controlled substances or recreational drugs or alcohol.

## 2021-10-09 NOTE — Progress Notes (Signed)
CSW spoke with Margaret [name] who states a staff member from the group home will come pick up the patient within two hours. CSW will e-mail discharge summary and AVS to Hilda Blades once it is available.  Madilyn Fireman, MSW, LCSW Transitions of Care  Clinical Social Worker II [phone]

## 2021-10-09 NOTE — Progress Notes (Signed)
IV removed and discharge instructions reviewed with Ralph Dowdy from [facility] who is to drive patient.  Transported by WC to main entrance.

## 2021-10-09 NOTE — Discharge Summary (Signed)
Physician Discharge Summary  Margaret [name] DUK:[id number] DOB: [birth year] DOA: 10/07/2021  PCP: Dettinger, Fransisca Kaufmann, MD  Admit date: 10/07/2021 Discharge date: 10/09/2021  Admitted From:  Group Home  Disposition:  Same   Recommendations for Outpatient Follow-up:  Follow up with PCP in 1-2 weeks Please have repeat Chest xray done in 3-4 weeks to ensure resolution of pneumonia  Discharge Condition: Stable   CODE STATUS: Full  DIET: resume prior home diet  Brief Hospitalization Summary: Please see all hospital notes, images, labs for full details of the hospitalization. Brief Admission History:  51 y.o. female with medical history significant of schizophrenia/impulse disorder,/intellectual disability, history of tuberculosis as a child who is a resident of  [facility] who presents to the emergency department via EMS due to abdominal pain, chest pain, nausea and vomiting.  Patient was unable to provide a history at bedside possibly due to underlying disability.  History was obtained from ED PA and ED medical record.  Per report, patient was noted to have several episodes of vomiting around noon today and she complained of right abdominal pain and periumbilical pain as well as pain going into her chest.  At baseline, patient takes Mucinex due to spitting regularly.  Since patient was diagnosed of pneumonia the last time she had similar presentation, EMS was activated and patient was taken to the ED for further evaluation and management.   ED Course:  In the emergency department, WBC was elevated (chronic), thrombocytosis, normocytic anemia.  Troponin x2 was negative, lipase 52, lactic acid 1.1, blood culture pending. CT abdomen and pelvis with contrast 1. New centrally cavitary consolidation in the right middle lobe measuring 5.7 cm is suspicious for cavitary pneumonia. 2. Additional multifocal nodular consolidations in the bilateral lung bases with bronchial wall thickening and mucous  plugging appears slightly improved from prior chest CT. 3. Prominent fluid-filled loops of small bowel without abrupt transition may reflect enteritis. 4. Small volume of pelvic free fluid is favored reactive or physiologic. Chest x-ray showed COPD changes with new right middle lobe pneumonia  Patient was treated with Unasyn, Zofran was, IV hydration was provided.  Hospitalist was asked to admit patient for further evaluation and management.    Hospital Course by problem:   Cavitary pneumonia -rule out aspiration Patient was treated with IV Unasyn with good results Her WBC count is trending down, she is weaned to room air oxygen.  She is feeling better.  Pt will DC home on oral augmentin to complete full 5 day course PLEASE HAVE REPEAT CHEST XRAY DONE IN 3-4 WEEKS TO ENSURE RESOLUTION OF  PNEUMONIA SLP evaluation completed 7/7: reg, thin recommended   Abdominal pain nausea and vomiting Secondary to Mild case of small bowel enteritis  Pt responded well to antibiotics and symptoms resolved now No diarrhea or abdominal pain at this time Tolerating diet  Treated with protonix in hospital   Chronic leukocytosis WBC trending down with treatment  Thrombocytosis REACTIVE Platelets 453>>303  Normocytic anemia Hemoglobin at 10.8, hemoglobin is within baseline range Continue to monitor H/H with morning labs  Elevated lipase possibly reactive Lipase 52, this does not appear to be pancreatitis Continue to treat as described for abdominal pain   Schizophrenia, intellectual disability Patient is a group home resident.   She presented with persistent arhythmic movement of her left upper extremity, this appears to be chronic in nature  Continue clozapine, Prozac    DVT prophylaxis: Lovenox   Code Status: Full code   Discharge Diagnoses:  Principal Problem:   Cavitary pneumonia Active Problems:   Schizophrenia ([place])   Leukocytosis   Aspiration pneumonia (HCC)   Thrombocytosis    Abdominal pain   Elevated lipase   Normocytic anemia   Nausea and vomiting   Discharge Instructions:  Allergies as of 10/09/2021       Reactions   Lactose Nausea And Vomiting   GI upset   Olanzapine    Other reaction(s): UNKNOWN   Tuberculin, Ppd    Other reaction(s): UNKNOWN   Lac Bovis Nausea And Vomiting   Other reaction(s): GI upset        Medication List     STOP taking these medications    fluticasone-salmeterol 115-21 MCG/ACT inhaler Commonly known as: Advair HFA   guaiFENesin 600 MG 12 hr tablet Commonly known as: Mucinex       TAKE these medications    Amantadine HCl 100 MG tablet Take 100 mg by mouth every morning.   amoxicillin-clavulanate 875-125 MG tablet Commonly known as: AUGMENTIN Take 1 tablet by mouth 2 (two) times daily for 4 days.   clozapine 200 MG tablet Commonly known as: CLOZARIL Take 400 mg by mouth at bedtime.   entecavir 0.5 MG tablet Commonly known as: BARACLUDE TAKE (1) TABLET BY MOUTH ONCE DAILY. What changed: See the new instructions.   Fish Oil-Vitamin D 1200-1000 MG-UNIT Caps TAKE 1 CAPSULE BY MOUTH TWICE DAILY.   FLUoxetine 20 MG capsule Commonly known as: PROZAC TAKE 3 CAPSULES BY MOUTH ONCE DAILY.   fluticasone 50 MCG/ACT nasal spray Commonly known as: FLONASE SPRAY 2 SPRAYS IN RIGHT NOSTRIL ONLY TWICE DAILY. What changed: See the new instructions.   levonorgestrel 20 MCG/24HR IUD Commonly known as: MIRENA 1 each by Intrauterine route once.   loratadine 10 MG tablet Commonly known as: Claritin Take 1 tablet (10 mg total) by mouth daily.   Minerin Creme Crea Apply 1 Application topically See admin instructions. Apply moderate amount topically to dry skin on soles and heels of both feet after shower What changed: See the new instructions.   mirtazapine 15 MG tablet Commonly known as: REMERON Take 15 mg by mouth at bedtime.   polyethylene glycol powder 17 GM/SCOOP powder Commonly known as: GoodSense  ClearLax Take 127.5 g by mouth 2 (two) times daily. What changed: See the new instructions.   Spiriva Respimat 1.25 MCG/ACT Aers Generic drug: Tiotropium Bromide Monohydrate Inhale 2 puffs into the lungs daily.   Sterile Gauze 3"X3" Pads 2 packets by Does not apply route daily.   Vitamin D3 50 MCG (2000 UT) capsule TAKE 1 CAPSULE BY MOUTH EVERY DAY   vitamin E 200 UNIT capsule Take 1 capsule (200 Units total) by mouth daily.        Follow-up Information     Dettinger, Fransisca Kaufmann, MD. Schedule an appointment as soon as possible for a visit in 10 day(s).   Specialties: Family Medicine, Cardiology Why: Hospital Follow Up Contact information: [place] [address] [phone]                Allergies  Allergen Reactions   Lactose Nausea And Vomiting    GI upset   Olanzapine     Other reaction(s): UNKNOWN   Tuberculin, Ppd     Other reaction(s): UNKNOWN   Lac Bovis Nausea And Vomiting    Other reaction(s): GI upset   Allergies as of 10/09/2021       Reactions   Lactose Nausea And Vomiting  GI upset   Olanzapine    Other reaction(s): UNKNOWN   Tuberculin, Ppd    Other reaction(s): UNKNOWN   Lac Bovis Nausea And Vomiting   Other reaction(s): GI upset        Medication List     STOP taking these medications    fluticasone-salmeterol 115-21 MCG/ACT inhaler Commonly known as: Advair HFA   guaiFENesin 600 MG 12 hr tablet Commonly known as: Mucinex       TAKE these medications    Amantadine HCl 100 MG tablet Take 100 mg by mouth every morning.   amoxicillin-clavulanate 875-125 MG tablet Commonly known as: AUGMENTIN Take 1 tablet by mouth 2 (two) times daily for 4 days.   clozapine 200 MG tablet Commonly known as: CLOZARIL Take 400 mg by mouth at bedtime.   entecavir 0.5 MG tablet Commonly known as: BARACLUDE TAKE (1) TABLET BY MOUTH ONCE DAILY. What changed: See the new instructions.   Fish Oil-Vitamin D 1200-1000 MG-UNIT  Caps TAKE 1 CAPSULE BY MOUTH TWICE DAILY.   FLUoxetine 20 MG capsule Commonly known as: PROZAC TAKE 3 CAPSULES BY MOUTH ONCE DAILY.   fluticasone 50 MCG/ACT nasal spray Commonly known as: FLONASE SPRAY 2 SPRAYS IN RIGHT NOSTRIL ONLY TWICE DAILY. What changed: See the new instructions.   levonorgestrel 20 MCG/24HR IUD Commonly known as: MIRENA 1 each by Intrauterine route once.   loratadine 10 MG tablet Commonly known as: Claritin Take 1 tablet (10 mg total) by mouth daily.   Minerin Creme Crea Apply 1 Application topically See admin instructions. Apply moderate amount topically to dry skin on soles and heels of both feet after shower What changed: See the new instructions.   mirtazapine 15 MG tablet Commonly known as: REMERON Take 15 mg by mouth at bedtime.   polyethylene glycol powder 17 GM/SCOOP powder Commonly known as: GoodSense ClearLax Take 127.5 g by mouth 2 (two) times daily. What changed: See the new instructions.   Spiriva Respimat 1.25 MCG/ACT Aers Generic drug: Tiotropium Bromide Monohydrate Inhale 2 puffs into the lungs daily.   Sterile Gauze 3"X3" Pads 2 packets by Does not apply route daily.   Vitamin D3 50 MCG (2000 UT) capsule TAKE 1 CAPSULE BY MOUTH EVERY DAY   vitamin E 200 UNIT capsule Take 1 capsule (200 Units total) by mouth daily.        Procedures/Studies: CT ABDOMEN PELVIS W CONTRAST  Result Date: 10/07/2021 CLINICAL DATA:  Acute abdominal pain with nausea and vomiting. EXAM: CT ABDOMEN AND PELVIS WITH CONTRAST TECHNIQUE: Multidetector CT imaging of the abdomen and pelvis was performed using the standard protocol following bolus administration of intravenous contrast. RADIATION DOSE REDUCTION: This exam was performed according to the departmental dose-optimization program which includes automated exposure control, adjustment of the mA and/or kV according to patient size and/or use of iterative reconstruction technique. CONTRAST:  61m  OMNIPAQUE IOHEXOL 300 MG/ML  SOLN COMPARISON:  Chest CT June 11, 2021, MRI pelvis April 10, 2020, and CT abdomen pelvis January 20, 2021 FINDINGS: Despite efforts by the technologist and patient, motion artifact is present on today's exam and could not be eliminated. This reduces exam sensitivity and specificity. Lower chest: New centrally cavitary consolidation in the right middle lobe measuring 5.7 cm on image 2/5. Additional multifocal nodular consolidations in the bilateral lung bases with bronchial wall thickening and mucous plugging appears slightly improved from prior chest CT. Hepatobiliary: No suspicious hepatic lesion. Gallbladder is dilated without wall thickening or pericholecystic fluid. No biliary ductal  dilation. Pancreas: No pancreatic ductal dilation or evidence of acute inflammation. Spleen: No splenomegaly or focal splenic lesion. Adrenals/Urinary Tract: No masses identified. No hydronephrosis. Urinary bladder is unremarkable for degree of distension. Stomach/Bowel: No radiopaque enteric contrast material was administered. Stomach is unremarkable for degree of distension. Prominent fluid-filled loops of small bowel without abrupt transition. Moderate volume of formed stool throughout the colon suggestive of constipation. Vascular/Lymphatic: Normal caliber abdominal aorta. No pathologically enlarged abdominal or pelvic lymph nodes. Reproductive: Intrauterine device in place. No suspicious adnexal mass. Other: Small volume of pelvic free fluid Musculoskeletal: No acute osseous abnormality. IMPRESSION: 1. New centrally cavitary consolidation in the right middle lobe measuring 5.7 cm is suspicious for cavitary pneumonia. 2. Additional multifocal nodular consolidations in the bilateral lung bases with bronchial wall thickening and mucous plugging appears slightly improved from prior chest CT. 3. Prominent fluid-filled loops of small bowel without abrupt transition may reflect enteritis. 4. Small  volume of pelvic free fluid is favored reactive or physiologic. Electronically Signed   By: Dahlia Bailiff M.D.   On: 10/07/2021 17:30   DG Chest 2 View  Result Date: 10/07/2021 CLINICAL DATA:  Chest pain with nausea and vomiting EXAM: CHEST - 2 VIEW COMPARISON:  05/20/2021 FINDINGS: Normal heart size, mediastinal contours, and pulmonary vascularity. Emphysematous and bronchitic changes consistent with COPD. Extensive parenchymal scarring LEFT apex. Chronic interstitial prominence with new infiltrate RIGHT middle lobe consistent with pneumonia. No pleural effusion or pneumothorax. Osseous demineralization with multiple old LEFT rib fractures and prior LEFT clavicular ORIF. IMPRESSION: COPD changes with new RIGHT middle lobe pneumonia. Followup PA and lateral chest X-ray is recommended in 3-4 weeks following trial of antibiotic therapy to ensure resolution and exclude underlying malignancy. Emphysema (ICD10-J43.9). Electronically Signed   By: Lavonia Dana M.D.   On: 10/07/2021 17:16     Subjective: Pt reports feeling better, no abdominal pain, no CP, no SOB.  No fever or chills.  Tolerating diet with no problems.   Discharge Exam: Vitals:   10/08/21 2100 10/09/21 0626  BP: 91/62 (!) 89/55  Pulse: 90 81  Resp: 18   Temp: 97.7 F (36.5 C) 98.3 F (36.8 C)  SpO2: 100% 99%   Vitals:   10/08/21 0457 10/08/21 1441 10/08/21 2100 10/09/21 0626  BP: (!) 90/58 126/79 91/62 (!) 89/55  Pulse: 82 80 90 81  Resp: 19 18 18    Temp: 97.7 F (36.5 C) 98.2 F (36.8 C) 97.7 F (36.5 C) 98.3 F (36.8 C)  TempSrc: Oral     SpO2: 99% 100% 100% 99%  Weight:      Height:       General: Pt is alert, awake, not in acute distress Cardiovascular: RRR, S1/S2 +, no rubs, no gallops Respiratory: CTA bilaterally, no wheezing, no rhonchi Abdominal: Soft, NT, ND, bowel sounds + Extremities: no edema, no cyanosis   The results of significant diagnostics from this hospitalization (including imaging, microbiology,  ancillary and laboratory) are listed below for reference.     Microbiology: Recent Results (from the past 240 hour(s))  Blood culture (routine x 2)     Status: None (Preliminary result)   Collection Time: 10/07/21  6:44 PM   Specimen: BLOOD RIGHT ARM  Result Value Ref Range Status   Specimen Description BLOOD RIGHT ARM  Final   Special Requests   Final    BOTTLES DRAWN AEROBIC AND ANAEROBIC Blood Culture adequate volume   Culture   Final    NO GROWTH 2 DAYS Performed at  [facility], [address], [place], [place] [address]    Report Status PENDING  Incomplete  Blood culture (routine x 2)     Status: None (Preliminary result)   Collection Time: 10/07/21  6:44 PM   Specimen: BLOOD LEFT ARM  Result Value Ref Range Status   Specimen Description BLOOD LEFT ARM  Final   Special Requests   Final    BOTTLES DRAWN AEROBIC AND ANAEROBIC Blood Culture adequate volume   Culture   Final    NO GROWTH 2 DAYS Performed at [facility], [address], [place], [place] [address]    Report Status PENDING  Incomplete     Labs: BNP (last 3 results) Recent Labs    02/01/21 2146  BNP 07.3   Basic Metabolic Panel: Recent Labs  Lab 10/07/21 1505 10/07/21 2245 10/08/21 0546  NA 135  --  140  K 4.5  --  3.5  CL 99  --  111  CO2 24  --  23  GLUCOSE 104*  --  77  BUN 17  --  11  CREATININE 0.62 0.52 0.53  CALCIUM 8.9  --  8.1*  MG  --   --  2.1  PHOS  --   --  3.5   Liver Function Tests: Recent Labs  Lab 10/07/21 1505 10/08/21 0546  AST 20 14*  ALT 14 12  ALKPHOS 104 89  BILITOT 0.3 0.4  PROT 7.8 6.2*  ALBUMIN 3.1* 2.5*   Recent Labs  Lab 10/07/21 1505  LIPASE 52*   No results for input(s): "AMMONIA" in the last 168 hours. CBC: Recent Labs  Lab 10/07/21 1505 10/07/21 2245 10/08/21 0546  WBC 20.9* 20.7* 15.0*  NEUTROABS 16.5*  --   --   HGB 10.8* 9.7* 10.6*  HCT 35.3* 30.7* 36.1  MCV 85.1 82.7 87.6  PLT 453* 351 303   Cardiac Enzymes: No results for  input(s): "CKTOTAL", "CKMB", "CKMBINDEX", "TROPONINI" in the last 168 hours. BNP: Invalid input(s): "POCBNP" CBG: No results for input(s): "GLUCAP" in the last 168 hours. D-Dimer No results for input(s): "DDIMER" in the last 72 hours. Hgb A1c No results for input(s): "HGBA1C" in the last 72 hours. Lipid Profile No results for input(s): "CHOL", "HDL", "LDLCALC", "TRIG", "CHOLHDL", "LDLDIRECT" in the last 72 hours. Thyroid function studies No results for input(s): "TSH", "T4TOTAL", "T3FREE", "THYROIDAB" in the last 72 hours.  Invalid input(s): "FREET3" Anemia work up No results for input(s): "VITAMINB12", "FOLATE", "FERRITIN", "TIBC", "IRON", "RETICCTPCT" in the last 72 hours. Urinalysis    Component Value Date/Time   COLORURINE YELLOW 10/08/2021 0247   APPEARANCEUR CLEAR 10/08/2021 0247   APPEARANCEUR Clear 07/08/2021 1502   LABSPEC 1.035 (H) 10/08/2021 0247   PHURINE 6.0 10/08/2021 0247   GLUCOSEU NEGATIVE 10/08/2021 0247   HGBUR NEGATIVE 10/08/2021 0247   BILIRUBINUR NEGATIVE 10/08/2021 0247   BILIRUBINUR Negative 07/08/2021 1502   KETONESUR NEGATIVE 10/08/2021 0247   PROTEINUR NEGATIVE 10/08/2021 0247   UROBILINOGEN 0.2 04/13/2011 0330   NITRITE NEGATIVE 10/08/2021 0247   LEUKOCYTESUR NEGATIVE 10/08/2021 0247   Sepsis Labs Recent Labs  Lab 10/07/21 1505 10/07/21 2245 10/08/21 0546  WBC 20.9* 20.7* 15.0*   Microbiology Recent Results (from the past 240 hour(s))  Blood culture (routine x 2)     Status: None (Preliminary result)   Collection Time: 10/07/21  6:44 PM   Specimen: BLOOD RIGHT ARM  Result Value Ref Range Status   Specimen Description BLOOD RIGHT ARM  Final   Special  Requests   Final    BOTTLES DRAWN AEROBIC AND ANAEROBIC Blood Culture adequate volume   Culture   Final    NO GROWTH 2 DAYS Performed at [facility], [address], [place], [place] [address]    Report Status PENDING  Incomplete  Blood culture (routine x 2)     Status: None (Preliminary  result)   Collection Time: 10/07/21  6:44 PM   Specimen: BLOOD LEFT ARM  Result Value Ref Range Status   Specimen Description BLOOD LEFT ARM  Final   Special Requests   Final    BOTTLES DRAWN AEROBIC AND ANAEROBIC Blood Culture adequate volume   Culture   Final    NO GROWTH 2 DAYS Performed at [facility], [address], [place], [place] [address]    Report Status PENDING  Incomplete   Time coordinating discharge: 38 mins   SIGNED:  Irwin Brakeman, MD  [facility] 10/09/2021, 10:49 AM How to contact the [facility] Attending or Consulting provider [place] or covering provider during after hours [place], for this patient?  Check the care team in [facility] and look for a) attending/consulting [facility] provider listed and b) the [facility] team listed Log into [email or web address] and use [place]'s universal password to access. If you do not have the password, please contact the hospital operator. Locate the [facility] provider you are looking for under [facility] and page to a number that you can be directly reached. If you still have difficulty reaching the provider, please page the [facility] (Director on Call) for the Hospitalists listed on amion for assistance.

## 2021-10-11 ENCOUNTER — Telehealth: Payer: Self-pay

## 2021-10-11 LAB — LEGIONELLA PNEUMOPHILA SEROGP 1 UR AG: L. pneumophila Serogp 1 Ur Ag: NEGATIVE

## 2021-10-11 NOTE — Telephone Encounter (Signed)
Transition Care Management Follow-up Telephone Call Date of discharge and from where: 10/09/21 [facility] - cavitary pneumonia How have you been since you were released from the hospital? Stable, seems better Any questions or concerns? No  Items Reviewed: Did the pt receive and understand the discharge instructions provided? Yes  Medications obtained and verified? Yes  Other? No  Any new allergies since your discharge? No  Dietary orders reviewed? Yes Do you have support at home? Yes   Home Care and Equipment/Supplies: Were home health services ordered? no  Were any new equipment or medical supplies ordered?  No  Functional Questionnaire: (I = Independent and D = Dependent) ADLs: I - with assistance  Bathing/Dressing- I - with assistance  Meal Prep- D  Eating- D for now - sometimes she eats fine on her own  Maintaining continence- D  Transferring/Ambulation- I  Managing Meds- D  Follow up appointments reviewed:  PCP Hospital f/u appt confirmed? Yes  Scheduled to see Dettinger on 10/13/21 @ 3:55. [facility] f/u appt confirmed? Yes  Scheduled to see Loanne Drilling, Pulmonologist on 10/20/21 @ 1:45. Are transportation arrangements needed? No  If their condition worsens, is the pt aware to call PCP or go to the Emergency Dept.? Yes Was the patient provided with contact information for the PCP's office or ED? Yes Was to pt encouraged to call back with questions or concerns? Yes

## 2021-10-12 LAB — CULTURE, BLOOD (ROUTINE X 2)
Culture: NO GROWTH
Culture: NO GROWTH
Special Requests: ADEQUATE
Special Requests: ADEQUATE

## 2021-10-13 ENCOUNTER — Inpatient Hospital Stay: Payer: Medicaid Other | Admitting: Family Medicine

## 2021-10-13 ENCOUNTER — Telehealth: Payer: Self-pay | Admitting: Family Medicine

## 2021-10-13 NOTE — Telephone Encounter (Signed)
Appointment scheduled for 10/22/21

## 2021-10-14 ENCOUNTER — Encounter: Payer: Self-pay | Admitting: Family Medicine

## 2021-10-19 ENCOUNTER — Other Ambulatory Visit: Payer: Self-pay | Admitting: Family Medicine

## 2021-10-19 ENCOUNTER — Other Ambulatory Visit: Payer: Medicaid Other

## 2021-10-19 DIAGNOSIS — Z79899 Other long term (current) drug therapy: Secondary | ICD-10-CM | POA: Diagnosis not present

## 2021-10-19 MED ORDER — CLOZAPINE 200 MG PO TABS
400.0000 mg | ORAL_TABLET | Freq: Every day | ORAL | 0 refills | Status: DC
Start: 2021-10-19 — End: 2021-11-19

## 2021-10-19 NOTE — Telephone Encounter (Signed)
  Prescription Request  10/19/2021  Is this a "Controlled Substance" medicine? NO  Have you seen your PCP in the last 2 weeks? No pt has appt on 10/22/2021 with DR. D.  If YES, route message to pool  -  If NO, patient needs to be scheduled for appointment.  What is the name of the medication or equipment? clozapine (CLOZARIL) 200 MG tablet  Have you contacted your pharmacy to request a refill? Yes   Which pharmacy would you like this sent to? [facility] [place], was told by pharmacy that she can not fill this until lab work is back? Not sure what they are referring to but pt is out of this medication    Patient notified that their request is being sent to the clinical staff for review and that they should receive a response within 2 business days.

## 2021-10-20 ENCOUNTER — Encounter: Payer: Self-pay | Admitting: Pulmonary Disease

## 2021-10-20 ENCOUNTER — Ambulatory Visit (INDEPENDENT_AMBULATORY_CARE_PROVIDER_SITE_OTHER): Payer: Medicare Other | Admitting: Pulmonary Disease

## 2021-10-20 VITALS — BP 96/63 | HR 83 | Ht 65.5 in | Wt 108.0 lb

## 2021-10-20 DIAGNOSIS — J189 Pneumonia, unspecified organism: Secondary | ICD-10-CM | POA: Diagnosis not present

## 2021-10-20 DIAGNOSIS — J984 Other disorders of lung: Secondary | ICD-10-CM | POA: Diagnosis not present

## 2021-10-20 DIAGNOSIS — R918 Other nonspecific abnormal finding of lung field: Secondary | ICD-10-CM

## 2021-10-20 MED ORDER — GUAIFENESIN ER 600 MG PO TB12: 600.0000 mg | ORAL_TABLET | Freq: Two times a day (BID) | ORAL | 2 refills | Status: DC | PRN

## 2021-10-20 MED ORDER — AMOXICILLIN-POT CLAVULANATE 875-125 MG PO TABS: 1.0000 | ORAL_TABLET | Freq: Two times a day (BID) | ORAL | 0 refills | Status: AC

## 2021-10-20 NOTE — Progress Notes (Signed)
@Patient  ID: Margaret [name], female    DOB: Mar 18, 1971, 51 y.o.   MRN: [id number]  Chief Complaint  Patient presents with   [facility] follow-up Cavitary pneumonia     Referring provider: Dettinger, Fransisca Kaufmann, MD  HPI: 51 year old female never smoker with emphysema, hx childhood TB, schizophrenia, complex ovarian cyst, intellectual disability, protein calorie malnutrition, hepatitis B, orthostatic hypotension. who presents for follow-up. Her caregiver is present.  Synopsis: She has had two recent hospitalizations related to multifocal pneumonia in setting of aspiration. First hospitalization complicated by empyema requiring chest tube in early November followed by readmission for pneumonia. Barium negative and was placed on regular diet.  04/22/21 Since our last visit she reports feeling overall well. Denies fevers chills. No cough, wheezing or shortness of breath. However she is concerned about the site on right thorax where her prior chest tube was in early November that is tender to touch. She has not kept it dressed or protected.   05/20/21 She presents with her caregiver. Since our last visit she has completed her antibiotics for cellulitis and removes resolved pain at prior chest tube site. She has productive cough that started 2 days ago. Amount of sputum significant with copious amount of thick yellow phlegm. Denies fevers, chills. Denies wheezing or shortness of breath.  06/22/21 Since our last visit, she has completed 3 weeks of antibiotics. However continues to have productive cough with green sputum. Denies shortness of breath. No fevers or chills.  09/27/21 She presents with her caregiver. Since our last visit she continues to have productive cough with green sputum. No wheezing or shortnes of breath. Compliant with Spiriva without changes.  10/20/21 [facility] in-office. Chart reviewed. She presents for hospital follow-up from 7/6-10/09/21. She presented for multiple  episodes of emesis and abdominal pain. She had imaging demonstrating new RML cavitary pneumonia measuring 5.7 cm with multifocal nodular opacities. She was treated with unasyn, zofran and IVF. Discharged on total 7 day course of antibiotics. She continues to have productive cough. Denies chest pain, shortness of breath or wheezing. No fevers, chills. Did not start the Advair since last visit.  Review of Systems  Constitutional:  Negative for chills, diaphoresis, fever, malaise/fatigue and weight loss.  HENT:  Negative for congestion.   Respiratory:  Positive for cough. Negative for hemoptysis, sputum production, shortness of breath and wheezing.   Cardiovascular:  Negative for chest pain, palpitations and leg swelling.    Allergies  Allergen Reactions   Lactose Nausea And Vomiting    GI upset   Olanzapine     Other reaction(s): UNKNOWN   Tuberculin, Ppd     Other reaction(s): UNKNOWN   Milk (Cow) Nausea And Vomiting    Other reaction(s): GI upset    Immunization History  Administered Date(s) Administered   Influenza,inj,Quad PF,6+ Mos 12/30/2015, 12/30/2016, 03/14/2018, 02/20/2019   Influenza-Unspecified 01/12/2015, 01/07/2020, 01/09/2021   Moderna Covid-19 Vaccine Bivalent Booster 31yr & up 02/15/2021   Moderna Sars-Covid-2 Vaccination 06/10/2019, 07/09/2019, 02/11/2020    Past Medical History:  Diagnosis Date   Hematuria 11/12/2014   [place]atitis [place]    Hypothyroidism    Impulse disorder    IUD (intrauterine device) in place 11/12/2014   [facility]  Mild intellectual disability    Schizophrenia ([facility]    Schizophrenia ([facility]    Tuberculosis    as a child   Urinary frequency 11/12/2014    Tobacco History: Social History   Tobacco Use  Smoking Status Never   Passive exposure: Never  Smokeless Tobacco Never   Counseling given: Not Answered   Outpatient Medications Prior to Visit  Medication Sig Dispense Refill   Amantadine HCl 100 MG tablet  Take 100 mg by mouth every morning.     Cholecalciferol (VITAMIN D3) 50 MCG (2000 UT) capsule TAKE 1 CAPSULE BY MOUTH EVERY DAY 30 capsule PRN   clozapine (CLOZARIL) 200 MG tablet Take 2 tablets (400 mg total) by mouth at bedtime. 90 tablet 0   entecavir (BARACLUDE) 0.5 MG tablet TAKE (1) TABLET BY MOUTH ONCE DAILY. (Patient taking differently: Take 0.5 mg by mouth daily.) 30 tablet 5   Fish Oil-Cholecalciferol (FISH OIL-VITAMIN D) 1200-1000 MG-UNIT CAPS TAKE 1 CAPSULE BY MOUTH TWICE DAILY. 60 capsule 11   FLUoxetine (PROZAC) 20 MG capsule TAKE 3 CAPSULES BY MOUTH ONCE DAILY. 90 capsule 11   fluticasone (FLONASE) 50 MCG/ACT nasal spray SPRAY 2 SPRAYS IN RIGHT NOSTRIL ONLY TWICE DAILY. (Patient taking differently: 2 sprays See admin instructions. 2 sprays into right nostril only twice daily) 16 g 0   Gauze Pads & Dressings (STERILE GAUZE) 3"X3" PADS 2 packets by Does not apply route daily. 30 each 1   levonorgestrel (MIRENA) 20 MCG/24HR IUD 1 each by Intrauterine route once.     loratadine (CLARITIN) 10 MG tablet Take 1 tablet (10 mg total) by mouth daily. 90 tablet 3   mirtazapine (REMERON) 15 MG tablet Take 15 mg by mouth at bedtime.     polyethylene glycol powder (GOODSENSE CLEARLAX) 17 GM/SCOOP powder Take 127.5 g by mouth 2 (two) times daily.     Skin Protectants, Misc. (MINERIN CREME) CREA Apply 1 Application topically See admin instructions. Apply moderate amount topically to dry skin on soles and heels of both feet after shower     Tiotropium Bromide Monohydrate (SPIRIVA RESPIMAT) 1.25 MCG/ACT AERS Inhale 2 puffs into the lungs daily. 4 g 5   vitamin E 200 UNIT capsule Take 1 capsule (200 Units total) by mouth daily. 30 capsule 11   No facility-administered medications prior to visit.      Objective:  BP 96/63   Pulse 83   Ht 5' 5.5" (1.664 m)   Wt 108 lb (49 kg)   SpO2 95%   BMI 17.70 kg/m   Physical Exam: General: Well-appearing, no acute distress HENT: [place], AT Eyes: EOMI,  no scleral icterus Respiratory: Clear to auscultation bilaterally.  No crackles, wheezing or rales Cardiovascular: RRR, -M/R/G, no JVD Extremities:-Edema,-tenderness Neuro: AAO x4, CNII-XII grossly intact Psych: Normal mood, flat affect  Lab Results:  CBC    Component Value Date/Time   WBC 16.2 (H) 10/22/2021 1329   WBC 15.0 (H) 10/08/2021 0546   RBC 4.35 10/22/2021 1329   RBC 4.12 10/08/2021 0546   HGB 11.5 10/22/2021 1329   HCT 35.1 10/22/2021 1329   PLT 352 10/22/2021 1329   MCV 81 10/22/2021 1329   MCH 26.4 (L) 10/22/2021 1329   MCH 25.7 (L) 10/08/2021 0546   MCHC 32.8 10/22/2021 1329   MCHC 29.4 (L) 10/08/2021 0546   RDW 15.3 10/22/2021 1329   LYMPHSABS 2.5 10/22/2021 1329   MONOABS 1.1 (H) 10/07/2021 1505   EOSABS 0.2 10/22/2021 1329   BASOSABS 0.1 10/22/2021 1329    BMET    Component Value Date/Time   NA 140 10/22/2021 1329   K 4.0 10/22/2021 1329   CL 102 10/22/2021 1329   CO2 26 10/22/2021 1329   GLUCOSE 152 (H) 10/22/2021 1329  GLUCOSE 77 10/08/2021 0546   BUN 11 10/22/2021 1329   CREATININE 0.73 10/22/2021 1329   CREATININE 0.73 06/30/2020 1054   CALCIUM 9.5 10/22/2021 1329   GFRNONAA >60 10/08/2021 0546   GFRAA 102 01/08/2020 1045    BNP    Component Value Date/Time   BNP 70.2 02/01/2021 2146     Imaging: CT Chest 03/04/21 - Bilateral atelectasis and infiltrate, small bilateral pleural effusions CXR 03/22/22 - Bibasilar opacities, unchanged CT Chest 04/23/21 - Improved small bilateral pleural effusions with loculation. Overall improved bilateral airspace disease however interval RUL opacity CXR 05/20/21 - Opacities in bilateral lower lobes. LLL atelectasis. CT Chest 06/11/21 - Improved prior opacities with patchy consolidation bilaterally, improved small bilateral effusions CXR 10/07/21 RML pneumonia CT A/P 10/07/21 RML cavitary pneumonia with mutifocal nodular consdolidations bilaterally  CT ABDOMEN PELVIS W CONTRAST  Result Date:  10/07/2021 CLINICAL DATA:  Acute abdominal pain with nausea and vomiting. EXAM: CT ABDOMEN AND PELVIS WITH CONTRAST TECHNIQUE: Multidetector CT imaging of the abdomen and pelvis was performed using the standard protocol following bolus administration of intravenous contrast. RADIATION DOSE REDUCTION: This exam was performed according to the departmental dose-optimization program which includes automated exposure control, adjustment of the mA and/or kV according to patient size and/or use of iterative reconstruction technique. CONTRAST:  40m OMNIPAQUE IOHEXOL 300 MG/ML  SOLN COMPARISON:  Chest CT June 11, 2021, MRI pelvis April 10, 2020, and CT abdomen pelvis January 20, 2021 FINDINGS: Despite efforts by the technologist and patient, motion artifact is present on today's exam and could not be eliminated. This reduces exam sensitivity and specificity. Lower chest: New centrally cavitary consolidation in the right middle lobe measuring 5.7 cm on image 2/5. Additional multifocal nodular consolidations in the bilateral lung bases with bronchial wall thickening and mucous plugging appears slightly improved from prior chest CT. Hepatobiliary: No suspicious hepatic lesion. Gallbladder is dilated without wall thickening or pericholecystic fluid. No biliary ductal dilation. Pancreas: No pancreatic ductal dilation or evidence of acute inflammation. Spleen: No splenomegaly or focal splenic lesion. Adrenals/Urinary Tract: No masses identified. No hydronephrosis. Urinary bladder is unremarkable for degree of distension. Stomach/Bowel: No radiopaque enteric contrast material was administered. Stomach is unremarkable for degree of distension. Prominent fluid-filled loops of small bowel without abrupt transition. Moderate volume of formed stool throughout the colon suggestive of constipation. Vascular/Lymphatic: Normal caliber abdominal aorta. No pathologically enlarged abdominal or pelvic lymph nodes. Reproductive: Intrauterine  device in place. No suspicious adnexal mass. Other: Small volume of pelvic free fluid Musculoskeletal: No acute osseous abnormality. IMPRESSION: 1. New centrally cavitary consolidation in the right middle lobe measuring 5.7 cm is suspicious for cavitary pneumonia. 2. Additional multifocal nodular consolidations in the bilateral lung bases with bronchial wall thickening and mucous plugging appears slightly improved from prior chest CT. 3. Prominent fluid-filled loops of small bowel without abrupt transition may reflect enteritis. 4. Small volume of pelvic free fluid is favored reactive or physiologic. Electronically Signed   By: JDahlia BailiffM.D.   On: 10/07/2021 17:30   DG Chest 2 View  Result Date: 10/07/2021 CLINICAL DATA:  Chest pain with nausea and vomiting EXAM: CHEST - 2 VIEW COMPARISON:  05/20/2021 FINDINGS: Normal heart size, mediastinal contours, and pulmonary vascularity. Emphysematous and bronchitic changes consistent with COPD. Extensive parenchymal scarring LEFT apex. Chronic interstitial prominence with new infiltrate RIGHT middle lobe consistent with pneumonia. No pleural effusion or pneumothorax. Osseous demineralization with multiple old LEFT rib fractures and prior LEFT clavicular ORIF. IMPRESSION: COPD changes with  new RIGHT middle lobe pneumonia. Followup PA and lateral chest X-ray is recommended in 3-4 weeks following trial of antibiotic therapy to ensure resolution and exclude underlying malignancy. Emphysema (ICD10-J43.9). Electronically Signed   By: Lavonia Dana M.D.   On: 10/07/2021 17:16          No data to display          No results found for: "NITRICOXIDE"      Assessment & Plan:  51 year old female never smoker with emphysema, hx childhood TB, schizophrenia/intellectual disability, complex ovarian cyst, hepatitis B who presents for follow-up. CT Chest reviewed with new RML cavitary pneumonia/lung abscess. Productive cough.  Cavitary pneumonia --Augmentin for  additional two weeks. --CT Chest without contrast in 1 month  Chronic bronchitis - symptomatic --CONTINUE Spiriva 1.25 mcg TWO puffs ONCE a day --CONTINUE Albuterol AS NEEDED for shortness of breath or wheezing --CONTINUE Mucinex to twice a day AS NEEDED for chest congestion  Allergic Rhinitis --CONTINUE loratadine 10 mg daily  Follow-up with me in 1 month  I have spent a total time of 31-minutes on the day of the appointment including chart review, data review, collecting history, coordinating care and discussing medical diagnosis and plan with the patient/family. Past medical history, allergies, medications were reviewed. Pertinent imaging, labs and tests included in this note have been reviewed and interpreted independently by me.  Chavie Kolinski Rodman Pickle, MD 10/24/2021

## 2021-10-20 NOTE — Patient Instructions (Addendum)
Cavitary pneumonia --Augmentin for additional two weeks. --CT Chest without contrast in 1 month  Chronic bronchitis - symptomatic --CONTINUE Spiriva 1.25 mcg TWO puffs ONCE a day --CONTINUE Albuterol AS NEEDED for shortness of breath or wheezing --CONTINUE Mucinex to twice a day AS NEEDED for chest congestions  Follow-up with me in 1 month

## 2021-10-22 ENCOUNTER — Encounter: Payer: Self-pay | Admitting: Family Medicine

## 2021-10-22 ENCOUNTER — Ambulatory Visit (INDEPENDENT_AMBULATORY_CARE_PROVIDER_SITE_OTHER): Payer: Medicare Other | Admitting: Family Medicine

## 2021-10-22 VITALS — BP 91/63 | HR 109 | Temp 97.4°F | Ht 65.5 in | Wt 102.0 lb

## 2021-10-22 DIAGNOSIS — J984 Other disorders of lung: Secondary | ICD-10-CM

## 2021-10-22 DIAGNOSIS — J189 Pneumonia, unspecified organism: Secondary | ICD-10-CM

## 2021-10-22 NOTE — Progress Notes (Signed)
BP 91/63   Pulse (!) 109   Temp (!) 97.4 F (36.3 C)   Ht 5' 5.5" (1.664 m)   Wt 102 lb (46.3 kg)   SpO2 94%   BMI 16.72 kg/m    Subjective:   [name], female    DOB: March 22, 1971, 51 y.o.   MRN: [id number]  HPI: Margaret [name] is a 51 y.o. female presenting on 10/22/2021 for Hosital follow up and Dizziness   HPI Cavitary pneumonia Hospital follow-up Patient was admitted on 10/07/2021 and discharged on 10/09/2021 for cavitary pneumonia and found to have pulmonary nodules.  She is a patient who currently lives in a group home.  Since leaving the hospital she has seen pulmonology who extended the course of antibiotics and also recommended a follow-up CT scan in 1 month.  They are concerned about possible aspiration and was treated initially with Unasyn and then discharged home with Augmentin.  She is using inhalers and they feel like her breathing is doing better although her psychiatric meds were off so she was not getting up and wanted to move around now that she is starting to this past week they feel like she is breathing okay and have not noticed that being the major issue.  She still has mild cough that is sometimes productive  Relevant past medical, surgical, family and social history reviewed and updated as indicated. Interim medical history since our last visit reviewed. Allergies and medications reviewed and updated.  Review of Systems  Constitutional:  Negative for chills and fever.  HENT:  Positive for congestion. Negative for ear discharge and ear pain.   Eyes:  Negative for redness and visual disturbance.  Respiratory:  Positive for cough. Negative for chest tightness, shortness of breath and wheezing.   Cardiovascular:  Negative for chest pain and leg swelling.  Musculoskeletal:  Negative for back pain and gait problem.  Skin:  Negative for rash.  Neurological:  Negative for dizziness, light-headedness and headaches.  Psychiatric/Behavioral:  Negative for  agitation and behavioral problems.   All other systems reviewed and are negative.   Per HPI unless specifically indicated above   Allergies as of 10/22/2021       Reactions   Lactose Nausea And Vomiting   GI upset   Olanzapine    Other reaction(s): UNKNOWN   Tuberculin, Ppd    Other reaction(s): UNKNOWN   Milk (cow) Nausea And Vomiting   Other reaction(s): GI upset        Medication List        Accurate as of October 22, 2021  1:46 PM. If you have any questions, ask your nurse or doctor.          Amantadine HCl 100 MG tablet Take 100 mg by mouth every morning.   amoxicillin-clavulanate 875-125 MG tablet Commonly known as: AUGMENTIN Take 1 tablet by mouth 2 (two) times daily for 14 days.   clozapine 200 MG tablet Commonly known as: CLOZARIL Take 2 tablets (400 mg total) by mouth at bedtime.   entecavir 0.5 MG tablet Commonly known as: BARACLUDE TAKE (1) TABLET BY MOUTH ONCE DAILY. What changed: See the new instructions.   Fish Oil-Vitamin D 1200-1000 MG-UNIT Caps TAKE 1 CAPSULE BY MOUTH TWICE DAILY.   FLUoxetine 20 MG capsule Commonly known as: PROZAC TAKE 3 CAPSULES BY MOUTH ONCE DAILY.   fluticasone 50 MCG/ACT nasal spray Commonly known as: FLONASE SPRAY 2 SPRAYS IN RIGHT NOSTRIL ONLY TWICE DAILY. What changed: See the new  instructions.   guaiFENesin 600 MG 12 hr tablet Commonly known as: Mucinex Take 1 tablet (600 mg total) by mouth 2 (two) times daily as needed for cough or to loosen phlegm.   levonorgestrel 20 MCG/24HR IUD Commonly known as: MIRENA 1 each by Intrauterine route once.   loratadine 10 MG tablet Commonly known as: Claritin Take 1 tablet (10 mg total) by mouth daily.   Minerin Creme Crea Apply 1 Application topically See admin instructions. Apply moderate amount topically to dry skin on soles and heels of both feet after shower   mirtazapine 15 MG tablet Commonly known as: REMERON Take 15 mg by mouth at bedtime.   polyethylene  glycol powder 17 GM/SCOOP powder Commonly known as: GoodSense ClearLax Take 127.5 g by mouth 2 (two) times daily.   Spiriva Respimat 1.25 MCG/ACT Aers Generic drug: Tiotropium Bromide Monohydrate Inhale 2 puffs into the lungs daily.   Sterile Gauze 3"X3" Pads 2 packets by Does not apply route daily.   Vitamin D3 50 MCG (2000 UT) capsule TAKE 1 CAPSULE BY MOUTH EVERY DAY   vitamin E 200 UNIT capsule Take 1 capsule (200 Units total) by mouth daily.         Objective:   BP 91/63   Pulse (!) 109   Temp (!) 97.4 F (36.3 C)   Ht 5' 5.5" (1.664 m)   Wt 102 lb (46.3 kg)   SpO2 94%   BMI 16.72 kg/m   Wt Readings from Last 3 Encounters:  10/22/21 102 lb (46.3 kg)  10/20/21 108 lb (49 kg)  10/07/21 106 lb 8 oz (48.3 kg)    Physical Exam Vitals and nursing note reviewed.  Constitutional:      General: She is not in acute distress.    Appearance: She is well-developed. She is not diaphoretic.  Eyes:     Conjunctiva/sclera: Conjunctivae normal.  Cardiovascular:     Rate and Rhythm: Normal rate and regular rhythm.     Heart sounds: Normal heart sounds. No murmur heard. Pulmonary:     Effort: Pulmonary effort is normal. No respiratory distress.     Breath sounds: Normal breath sounds. No wheezing, rhonchi or rales.  Musculoskeletal:        General: No tenderness. Normal range of motion.  Skin:    General: Skin is warm and dry.     Findings: No rash.  Neurological:     Mental Status: She is alert.     Coordination: Coordination normal.  Psychiatric:        Mood and Affect: Affect is flat.        Behavior: Behavior normal.       Assessment & Plan:   Problem List Items Addressed This Visit       Respiratory   Cavitary pneumonia - Primary   Relevant Orders   CBC with Differential/Platelet   CMP14+EGFR    We will check blood work and continue to follow-up with pulmonology. Follow up plan: Return if symptoms worsen or fail to improve.  Counseling provided  for all of the vaccine components Orders Placed This Encounter  Procedures   CBC with Differential/Platelet   Waynoka Mate Alegria, MD [facility] 10/22/2021, 1:46 PM

## 2021-10-23 LAB — CBC WITH DIFFERENTIAL/PLATELET
Basophils Absolute: 0.1 10*3/uL (ref 0.0–0.2)
Basos: 1 %
EOS (ABSOLUTE): 0.2 10*3/uL (ref 0.0–0.4)
Eos: 1 %
Hematocrit: 35.1 % (ref 34.0–46.6)
Hemoglobin: 11.5 g/dL (ref 11.1–15.9)
Immature Grans (Abs): 0 10*3/uL (ref 0.0–0.1)
Immature Granulocytes: 0 %
Lymphocytes Absolute: 2.5 10*3/uL (ref 0.7–3.1)
Lymphs: 16 %
MCH: 26.4 pg — ABNORMAL LOW (ref 26.6–33.0)
MCHC: 32.8 g/dL (ref 31.5–35.7)
MCV: 81 fL (ref 79–97)
Monocytes Absolute: 0.7 10*3/uL (ref 0.1–0.9)
Monocytes: 4 %
Neutrophils Absolute: 12.6 10*3/uL — ABNORMAL HIGH (ref 1.4–7.0)
Neutrophils: 78 %
Platelets: 352 10*3/uL (ref 150–450)
RBC: 4.35 x10E6/uL (ref 3.77–5.28)
RDW: 15.3 % (ref 11.7–15.4)
WBC: 16.2 10*3/uL — ABNORMAL HIGH (ref 3.4–10.8)

## 2021-10-23 LAB — CMP14+EGFR
ALT: 20 IU/L (ref 0–32)
AST: 25 IU/L (ref 0–40)
Albumin/Globulin Ratio: 1.4 (ref 1.2–2.2)
Albumin: 4 g/dL (ref 3.9–4.9)
Alkaline Phosphatase: 137 IU/L — ABNORMAL HIGH (ref 44–121)
BUN/Creatinine Ratio: 15 (ref 9–23)
BUN: 11 mg/dL (ref 6–24)
Bilirubin Total: 0.5 mg/dL (ref 0.0–1.2)
CO2: 26 mmol/L (ref 20–29)
Calcium: 9.5 mg/dL (ref 8.7–10.2)
Chloride: 102 mmol/L (ref 96–106)
Creatinine, Ser: 0.73 mg/dL (ref 0.57–1.00)
Globulin, Total: 2.9 g/dL (ref 1.5–4.5)
Glucose: 152 mg/dL — ABNORMAL HIGH (ref 70–99)
Potassium: 4 mmol/L (ref 3.5–5.2)
Sodium: 140 mmol/L (ref 134–144)
Total Protein: 6.9 g/dL (ref 6.0–8.5)
eGFR: 101 mL/min/{1.73_m2} (ref >59)

## 2021-10-24 ENCOUNTER — Encounter: Payer: Self-pay | Admitting: Pulmonary Disease

## 2021-11-04 ENCOUNTER — Other Ambulatory Visit: Payer: Self-pay

## 2021-11-04 DIAGNOSIS — D72829 Elevated white blood cell count, unspecified: Secondary | ICD-10-CM

## 2021-11-09 ENCOUNTER — Ambulatory Visit (HOSPITAL_COMMUNITY): Admission: RE | Admit: 2021-11-09 | Payer: Medicare Other | Source: Ambulatory Visit

## 2021-11-15 ENCOUNTER — Other Ambulatory Visit: Payer: Self-pay | Admitting: Family Medicine

## 2021-11-19 ENCOUNTER — Other Ambulatory Visit: Payer: Medicaid Other

## 2021-11-19 ENCOUNTER — Other Ambulatory Visit: Payer: Self-pay | Admitting: Family Medicine

## 2021-11-19 DIAGNOSIS — Z79899 Other long term (current) drug therapy: Secondary | ICD-10-CM | POA: Diagnosis not present

## 2021-12-28 ENCOUNTER — Ambulatory Visit (INDEPENDENT_AMBULATORY_CARE_PROVIDER_SITE_OTHER): Payer: Medicare Other | Admitting: Pulmonary Disease

## 2021-12-28 ENCOUNTER — Encounter: Payer: Self-pay | Admitting: Pulmonary Disease

## 2021-12-28 VITALS — BP 102/68 | HR 101 | Ht 66.0 in | Wt 107.2 lb

## 2021-12-28 DIAGNOSIS — J440 Chronic obstructive pulmonary disease with acute lower respiratory infection: Secondary | ICD-10-CM | POA: Diagnosis not present

## 2021-12-28 DIAGNOSIS — J984 Other disorders of lung: Secondary | ICD-10-CM | POA: Diagnosis not present

## 2021-12-28 DIAGNOSIS — J209 Acute bronchitis, unspecified: Secondary | ICD-10-CM | POA: Diagnosis not present

## 2021-12-28 DIAGNOSIS — J189 Pneumonia, unspecified organism: Secondary | ICD-10-CM

## 2021-12-28 DIAGNOSIS — Z23 Encounter for immunization: Secondary | ICD-10-CM | POA: Diagnosis not present

## 2021-12-28 MED ORDER — FLUTICASONE-SALMETEROL 115-21 MCG/ACT IN AERO: 2.0000 | INHALATION_SPRAY | Freq: Two times a day (BID) | RESPIRATORY_TRACT | 5 refills | Status: DC

## 2021-12-28 MED ORDER — PREDNISONE 20 MG PO TABS: 40.0000 mg | ORAL_TABLET | Freq: Every day | ORAL | 0 refills | Status: DC

## 2021-12-28 NOTE — Progress Notes (Unsigned)
@Patient  ID: Margaret [name], female    DOB: Mar 05, 1971, 51 y.o.   MRN: [id number]  Chief Complaint  Patient presents with   Follow-up    Coughing up mucous     Referring provider: Dettinger, Fransisca Kaufmann, MD  HPI: 51 year old female never smoker with emphysema, hx childhood TB, schizophrenia, complex ovarian cyst, intellectual disability, protein calorie malnutrition, hepatitis B, orthostatic hypotension. who presents for follow-up. Her caregiver is present.  Synopsis: She has had two recent hospitalizations related to multifocal pneumonia in setting of aspiration. First hospitalization complicated by empyema requiring chest tube in early November followed by readmission for pneumonia. Barium negative and was placed on regular diet.  04/22/21 Since our last visit she reports feeling overall well. Denies fevers chills. No cough, wheezing or shortness of breath. However she is concerned about the site on right thorax where her prior chest tube was in early November that is tender to touch. She has not kept it dressed or protected.   05/20/21 She presents with her caregiver. Since our last visit she has completed her antibiotics for cellulitis and removes resolved pain at prior chest tube site. She has productive cough that started 2 days ago. Amount of sputum significant with copious amount of thick yellow phlegm. Denies fevers, chills. Denies wheezing or shortness of breath.  06/22/21 Since our last visit, she has completed 3 weeks of antibiotics. However continues to have productive cough with green sputum. Denies shortness of breath. No fevers or chills.  09/27/21 She presents with her caregiver. Since our last visit she continues to have productive cough with green sputum. No wheezing or shortnes of breath. Compliant with Spiriva without changes.  10/20/21 [facility] in-office. Chart reviewed. She presents for hospital follow-up from 7/6-10/09/21. She presented for multiple episodes of emesis  and abdominal pain. She had imaging demonstrating new RML cavitary pneumonia measuring 5.7 cm with multifocal nodular opacities. She was treated with unasyn, zofran and IVF. Discharged on total 7 day course of antibiotics. She continues to have productive cough. Denies chest pain, shortness of breath or wheezing. No fevers, chills. Did not start the Advair since last visit.  12/28/21 Since our last visit she continues to have cough. Previously when she took antibiotics her cough improved but had persistent cough. Cough has become more productive in the last few days. Denies fevers or chills.   ROS  Allergies  Allergen Reactions   Lactose Nausea And Vomiting    GI upset   Olanzapine     Other reaction(s): UNKNOWN   Tuberculin, Ppd     Other reaction(s): UNKNOWN   Milk (Cow) Nausea And Vomiting    Other reaction(s): GI upset    Immunization History  Administered Date(s) Administered   Influenza,inj,Quad PF,6+ Mos 12/30/2015, 12/30/2016, 03/14/2018, 02/20/2019   Influenza-Unspecified 01/12/2015, 01/07/2020, 01/09/2021   Moderna Covid-19 Vaccine Bivalent Booster 67yr & up 02/15/2021   Moderna Sars-Covid-2 Vaccination 06/10/2019, 07/09/2019, 02/11/2020    Past Medical History:  Diagnosis Date   Hematuria 11/12/2014   [place] ([place]    Hypothyroidism    Impulse disorder    IUD (intrauterine device) in place 11/12/2014   [facility]  Mild intellectual disability    Schizophrenia ([place]    Schizophrenia ([place]    Tuberculosis    as a child   Urinary frequency 11/12/2014    Tobacco History: Social History   Tobacco Use  Smoking Status Never   Passive exposure: Never  Smokeless Tobacco Never  Counseling given: Not Answered   Outpatient Medications Prior to Visit  Medication Sig Dispense Refill   Amantadine HCl 100 MG tablet Take 100 mg by mouth every morning.     Cholecalciferol (VITAMIN D3) 50 MCG (2000 UT) capsule TAKE 1 CAPSULE BY MOUTH EVERY DAY  30 capsule PRN   clozapine (CLOZARIL) 200 MG tablet TAKE 2 TABLETS BY MOUTH AT BEDTIME. 60 tablet 1   entecavir (BARACLUDE) 0.5 MG tablet TAKE (1) TABLET BY MOUTH ONCE DAILY. (Patient taking differently: Take 0.5 mg by mouth daily.) 30 tablet 5   Fish Oil-Cholecalciferol (FISH OIL-VITAMIN D) 1200-1000 MG-UNIT CAPS TAKE 1 CAPSULE BY MOUTH TWICE DAILY. 60 capsule 11   FLUoxetine (PROZAC) 20 MG capsule TAKE 3 CAPSULES BY MOUTH ONCE DAILY. 90 capsule 11   fluticasone (FLONASE) 50 MCG/ACT nasal spray SPRAY 2 SPRAYS IN RIGHT NOSTRIL ONLY TWICE DAILY. 16 g 2   Gauze Pads & Dressings (STERILE GAUZE) 3"X3" PADS 2 packets by Does not apply route daily. 30 each 1   guaiFENesin (MUCINEX) 600 MG 12 hr tablet Take 1 tablet (600 mg total) by mouth 2 (two) times daily as needed for cough or to loosen phlegm. 60 tablet 2   levonorgestrel (MIRENA) 20 MCG/24HR IUD 1 each by Intrauterine route once.     loratadine (CLARITIN) 10 MG tablet Take 1 tablet (10 mg total) by mouth daily. 90 tablet 3   mirtazapine (REMERON) 15 MG tablet Take 15 mg by mouth at bedtime.     polyethylene glycol powder (GOODSENSE CLEARLAX) 17 GM/SCOOP powder Take 127.5 g by mouth 2 (two) times daily.     Skin Protectants, Misc. (MINERIN CREME) CREA Apply 1 Application topically See admin instructions. Apply moderate amount topically to dry skin on soles and heels of both feet after shower     Tiotropium Bromide Monohydrate (SPIRIVA RESPIMAT) 1.25 MCG/ACT AERS Inhale 2 puffs into the lungs daily. 4 g 5   vitamin E 200 UNIT capsule Take 1 capsule (200 Units total) by mouth daily. 30 capsule 11   No facility-administered medications prior to visit.      Objective:  BP 102/68 (BP Location: Right Arm, Cuff Size: Normal)   Pulse (!) 101   Ht 5' 6"  (1.676 m)   Wt 107 lb 3.2 oz (48.6 kg)   SpO2 98%   BMI 17.30 kg/m   Physical Exam: General: Well-appearing, no acute distress HENT: [place], AT Eyes: EOMI, no scleral icterus Respiratory: Clear  to auscultation bilaterally.  No crackles, wheezing or rales Cardiovascular: RRR, -M/R/G, no JVD Extremities:-Edema,-tenderness Neuro: AAO x4, CNII-XII grossly intact Psych: Normal mood, flat affect  Lab Results:  CBC    Component Value Date/Time   WBC 16.2 (H) 10/22/2021 1329   WBC 15.0 (H) 10/08/2021 0546   RBC 4.35 10/22/2021 1329   RBC 4.12 10/08/2021 0546   HGB 11.5 10/22/2021 1329   HCT 35.1 10/22/2021 1329   PLT 352 10/22/2021 1329   MCV 81 10/22/2021 1329   MCH 26.4 (L) 10/22/2021 1329   MCH 25.7 (L) 10/08/2021 0546   MCHC 32.8 10/22/2021 1329   MCHC 29.4 (L) 10/08/2021 0546   RDW 15.3 10/22/2021 1329   LYMPHSABS 2.5 10/22/2021 1329   MONOABS 1.1 (H) 10/07/2021 1505   EOSABS 0.2 10/22/2021 1329   BASOSABS 0.1 10/22/2021 1329    BMET    Component Value Date/Time   NA 140 10/22/2021 1329   K 4.0 10/22/2021 1329   CL 102 10/22/2021 1329   CO2 26  10/22/2021 1329   GLUCOSE 152 (H) 10/22/2021 1329   GLUCOSE 77 10/08/2021 0546   BUN 11 10/22/2021 1329   CREATININE 0.73 10/22/2021 1329   CREATININE 0.73 06/30/2020 1054   CALCIUM 9.5 10/22/2021 1329   GFRNONAA >60 10/08/2021 0546   GFRAA 102 01/08/2020 1045    BNP    Component Value Date/Time   BNP 70.2 02/01/2021 2146     Imaging: CT Chest 03/04/21 - Bilateral atelectasis and infiltrate, small bilateral pleural effusions CXR 03/22/22 - Bibasilar opacities, unchanged CT Chest 04/23/21 - Improved small bilateral pleural effusions with loculation. Overall improved bilateral airspace disease however interval RUL opacity CXR 05/20/21 - Opacities in bilateral lower lobes. LLL atelectasis. CT Chest 06/11/21 - Improved prior opacities with patchy consolidation bilaterally, improved small bilateral effusions CXR 10/07/21 RML pneumonia CT A/P 10/07/21 RML cavitary pneumonia with mutifocal nodular consdolidations bilaterally  No results found.        No data to display           No results found for:  "NITRICOXIDE"      Assessment & Plan:  51 year old female never smoker with emphysema, hx childhood TB, schizophrenia/intellectual disability, complex ovarian cyst, hepatitis B who presents for follow-up. CT Chest reviewed with new RML cavitary pneumonia/lung abscess. Productive cough.  Cavitary pneumonia --SCHEDULE CT Chest without contrast as soon as available  Recurrent respiratory infections Acute on Chronic bronchitis - symptomatic --START prednisone 40 mg x 5 days --START Advair HFA 115-21 TWO puffs in morning and evening. Rinse mouth out after use --CONTINUE Spiriva 1.25 mcg TWO puffs ONCE a day --CONTINUE Albuterol AS NEEDED for shortness of breath or wheezing --CONTINUE Mucinex to twice a day AS NEEDED for chest congestion  Allergic Rhinitis --CONTINUE loratadine 10 mg daily  I have spent a total time of***-minutes on the day of the appointment including chart review, data review, collecting history, coordinating care and discussing medical diagnosis and plan with the patient/family. Past medical history, allergies, medications were reviewed. Pertinent imaging, labs and tests included in this note have been reviewed and interpreted independently by me.  Denielle Bayard Rodman Pickle, MD 12/28/2021

## 2021-12-28 NOTE — Patient Instructions (Addendum)
Cavitary pneumonia --SCHEDULE CT Chest without contrast as soon as available  Recurrent respiratory infections Acute on Chronic bronchitis - symptomatic --START prednisone 40 mg x 5 days --START Advair HFA 115-21 TWO puffs in morning and evening. Rinse mouth out after use --CONTINUE Spiriva 1.25 mcg TWO puffs ONCE a day --CONTINUE Albuterol AS NEEDED for shortness of breath or wheezing --CONTINUE Mucinex to twice a day AS NEEDED for chest congestion  Follow-up with NP or available physician in two weeks after CT

## 2021-12-29 ENCOUNTER — Other Ambulatory Visit: Payer: Self-pay | Admitting: Family Medicine

## 2022-01-02 DIAGNOSIS — J189 Pneumonia, unspecified organism: Secondary | ICD-10-CM

## 2022-01-02 DIAGNOSIS — R918 Other nonspecific abnormal finding of lung field: Secondary | ICD-10-CM

## 2022-01-02 HISTORY — DX: Other nonspecific abnormal finding of lung field: R91.8

## 2022-01-02 HISTORY — DX: Pneumonia, unspecified organism: J18.9

## 2022-01-03 ENCOUNTER — Ambulatory Visit: Payer: Medicaid Other | Admitting: Family Medicine

## 2022-01-05 ENCOUNTER — Other Ambulatory Visit: Payer: Self-pay | Admitting: Family Medicine

## 2022-01-05 DIAGNOSIS — Z1231 Encounter for screening mammogram for malignant neoplasm of breast: Secondary | ICD-10-CM

## 2022-01-06 ENCOUNTER — Encounter (INDEPENDENT_AMBULATORY_CARE_PROVIDER_SITE_OTHER): Payer: Self-pay | Admitting: Gastroenterology

## 2022-01-06 ENCOUNTER — Ambulatory Visit (INDEPENDENT_AMBULATORY_CARE_PROVIDER_SITE_OTHER): Payer: Medicare Other | Admitting: Gastroenterology

## 2022-01-06 VITALS — BP 114/79 | HR 98 | Temp 98.1°F | Ht 66.0 in | Wt 105.9 lb

## 2022-01-06 DIAGNOSIS — B181 Chronic viral hepatitis B without delta-agent: Secondary | ICD-10-CM | POA: Diagnosis not present

## 2022-01-06 DIAGNOSIS — K7400 Hepatic fibrosis, unspecified: Secondary | ICD-10-CM | POA: Diagnosis not present

## 2022-01-06 NOTE — Progress Notes (Addendum)
Referring Provider: Dettinger, Fransisca Kaufmann, MD Primary Care Physician:  Dettinger, Fransisca Kaufmann, MD Primary GI Physician: previously rehman   Chief Complaint  Patient presents with   Follow-up    Patient here today for a follow up on her chronic Hep B. Per caregiver patient has not had any current Gi complaints.   HPI:   Margaret [name] is a 51 y.o. female with past medical history of Hep B, hypothyroidism, impulse disorder, mild intellectual disability, schizophrenia.   Patient presenting today for chronic hepatitis B.  History:  Last seen in April 2023, doing well at that time.   Liver biopsy in dec 2016 with chronic hepatitis B and fibrosis around central veins, last elastography in 2016 with F2/F3 disease, [place] in Feb 2021 with no liver abnormalities. Maintained on Entecavir 0.5 mg daily, Hep B surf antigen and HBV DNA ordered at last visit were not completed. Previous HBV DNA undetectable in March 2022 though no seroconversion as Hep B surface Ag was still reactive.    Last LFTs in July with alk phos 137, AST 25, ALT 20, CT A/P with contrast July 2023 with no focal liver abnormalities.   Present: Patient arrives with her caregiver Margaret [name], they state she is doing well overall. She is eating well, eats most anything they provide to her at the group home. She denies any abdominal pain, nausea or vomiting. Denies rectal bleeding or melena. Had a recent ED visit in July for pneumonia. Caregiver reports that they thought she may have aspiration pna and has follow up CT soon in regards to this. She is taking entecavir 0.72m daily compliantly.   No red flag symptoms. Patient denies melena, hematochezia, nausea, vomiting, diarrhea, constipation, dysphagia, odyonophagia, early satiety or weight loss.    Last Colonoscopy: never  Last Endoscopy: never  Recommendations:    Past Medical History:  Diagnosis Date   Hematuria 11/12/2014   Hepatitis B carrier (HCC)    Hypothyroidism    Impulse disorder     IUD (intrauterine device) in place 11/12/2014   [facility]  Mild intellectual disability    Schizophrenia ([place]    Schizophrenia ([place]    Tuberculosis    as a child   Urinary frequency 11/12/2014    Past Surgical History:  Procedure Laterality Date   ARM WOUND REPAIR / CLOSURE     TRACHEAL SURGERY     TRACHEOSTOMY      Current Outpatient Medications  Medication Sig Dispense Refill   Amantadine HCl 100 MG tablet Take 100 mg by mouth every morning.     Cholecalciferol (VITAMIN D3) 50 MCG (2000 UT) capsule TAKE 1 CAPSULE BY MOUTH EVERY DAY 30 capsule PRN   clozapine (CLOZARIL) 200 MG tablet TAKE 2 TABLETS BY MOUTH AT BEDTIME. 60 tablet 1   entecavir (BARACLUDE) 0.5 MG tablet TAKE (1) TABLET BY MOUTH ONCE DAILY. (Patient taking differently: Take 0.5 mg by mouth daily.) 30 tablet 5   feeding supplement (BOOST HIGH PROTEIN) LIQD Take 1 Container by mouth 3 (three) times daily between meals. 237 mls Tid between meals.     Fish Oil-Cholecalciferol (FISH OIL-VITAMIN D) 1200-1000 MG-UNIT CAPS TAKE 1 CAPSULE BY MOUTH TWICE DAILY. 60 capsule 11   FLUoxetine (PROZAC) 20 MG capsule TAKE 3 CAPSULES BY MOUTH ONCE DAILY. 90 capsule 0   fluticasone (FLONASE) 50 MCG/ACT nasal spray SPRAY 2 SPRAYS IN RIGHT NOSTRIL ONLY TWICE DAILY. 16 g 2   fluticasone-salmeterol (ADVAIR HFA) 115-21 MCG/ACT inhaler Inhale 2 puffs  into the lungs 2 (two) times daily. 1 each 5   Gauze Pads & Dressings (STERILE GAUZE) 3"X3" PADS 2 packets by Does not apply route daily. 30 each 1   guaiFENesin (MUCINEX) 600 MG 12 hr tablet Take 1 tablet (600 mg total) by mouth 2 (two) times daily as needed for cough or to loosen phlegm. 60 tablet 2   levonorgestrel (MIRENA) 20 MCG/24HR IUD 1 each by Intrauterine route once.     loratadine (CLARITIN) 10 MG tablet Take 1 tablet (10 mg total) by mouth daily. 90 tablet 3   mirtazapine (REMERON) 15 MG tablet Take 15 mg by mouth at bedtime.     polyethylene glycol powder (GOODSENSE  CLEARLAX) 17 GM/SCOOP powder Take 127.5 g by mouth 2 (two) times daily. (Patient taking differently: Take 1 Container by mouth 2 (two) times daily. 17 grams Bid)     Skin Protectants, Misc. (MINERIN CREME) CREA Apply 1 Application topically See admin instructions. Apply moderate amount topically to dry skin on soles and heels of both feet after shower     Tiotropium Bromide Monohydrate (SPIRIVA RESPIMAT) 1.25 MCG/ACT AERS Inhale 2 puffs into the lungs daily. 4 g 5   vitamin E 200 UNIT capsule Take 1 capsule (200 Units total) by mouth daily. 30 capsule 11   No current facility-administered medications for this visit.    Allergies as of 01/06/2022 - Review Complete 01/06/2022  Allergen Reaction Noted   Lactose Nausea And Vomiting 08/06/2012   Olanzapine  08/06/2012   Tuberculin, ppd  08/06/2012   Milk (cow) Nausea And Vomiting 08/06/2012    Family History  Adopted: Yes  Family history unknown: Yes    Social History   Socioeconomic History   Marital status: Single    Spouse name: Not on file   Number of children: 0   Years of education: 12   Highest education level: High school graduate  Occupational History   Occupation: disabled    Comment: due to schizophrenia  Tobacco Use   Smoking status: Never    Passive exposure: Never   Smokeless tobacco: Never  Vaping Use   Vaping Use: Never used  Substance and Sexual Activity   Alcohol use: No    Alcohol/week: 0.0 standard drinks of alcohol   Drug use: No   Sexual activity: Not Currently    Birth control/protection: I.U.D.  Other Topics Concern   Not on file  Social History Narrative   Patient was adopted. She does not have any children and has never been married. She lives in a group home and is disabled. Has a diagnosis of schizophrenia. She socially isolates herself, doesn't like to participate in group activities, sleeps a lot, has to be prompted to bathe, eat, dress, etc. -07/22/20   Social Determinants of Health    Financial Resource Strain: Low Risk  (07/23/2021)   Overall Financial Resource Strain (CARDIA)    Difficulty of Paying Living Expenses: Not hard at all  Food Insecurity: No Food Insecurity (07/23/2021)   Hunger Vital Sign    Worried About Running Out of Food in the Last Year: Never true    [place] in the Last Year: Never true  Transportation Needs: No Transportation Needs (07/23/2021)   PRAPARE - Hydrologist (Medical): No    Lack of Transportation (Non-Medical): No  Physical Activity: Insufficiently Active (07/23/2021)   Exercise Vital Sign    Days of Exercise per Week: 7 days    Minutes  of Exercise per Session: 20 min  Stress: No Stress Concern Present (07/23/2021)   [place]    Feeling of Stress : Only a little  Social Connections: Socially Isolated (07/23/2021)   Social Connection and Isolation Panel [NHANES]    Frequency of Communication with Friends and Family: More than three times a week    Frequency of Social Gatherings with Friends and Family: More than three times a week    Attends Religious Services: Never    Marine scientist or Organizations: No    Attends Music therapist: Never    Marital Status: Never married   Review of systems General: negative for malaise, night sweats, fever, chills, weight loss Neck: Negative for lumps, goiter, pain and significant neck swelling Resp: Negative for cough, wheezing, dyspnea at rest CV: Negative for chest pain, leg swelling, palpitations, orthopnea GI: denies melena, hematochezia, nausea, vomiting, diarrhea, constipation, dysphagia, odyonophagia, early satiety or unintentional weight loss.  MSK: Negative for joint pain or swelling, back pain, and muscle pain. Derm: Negative for itching or rash Psych: Denies depression, anxiety, memory loss, confusion. No homicidal or suicidal ideation.  Heme: Negative for  prolonged bleeding, bruising easily, and swollen nodes. Endocrine: Negative for cold or heat intolerance, polyuria, polydipsia and goiter. Neuro: negative for tremor, gait imbalance, syncope and seizures. The remainder of the review of systems is noncontributory.  Physical Exam: BP 114/79 (BP Location: Right Arm, Patient Position: Sitting, Cuff Size: Small)   Pulse 98   Temp 98.1 F (36.7 C) (Oral)   Ht 5' 6" (1.676 m)   Wt 105 lb 14.4 oz (48 kg)   BMI 17.09 kg/m  General:   Alert and oriented. No distress noted. Pleasant and cooperative.  Head:  Normocephalic and atraumatic. Eyes:  Conjuctiva clear without scleral icterus. Mouth:  Oral mucosa pink and moist. Good dentition. No lesions. Heart: Normal rate and rhythm, s1 and s2 heart sounds present.  Lungs: Clear lung sounds in all lobes. Respirations equal and unlabored. Abdomen:  +BS, soft, non-tender and non-distended. No rebound or guarding. No HSM or masses noted. Derm: No palmar erythema or jaundice Msk:  Symmetrical without gross deformities. Normal posture. Extremities:  Without edema. Neurologic:  Alert and  oriented x4 Psych:  Alert and cooperative. Normal mood and affect.  Invalid input(s): "6 MONTHS"   ASSESSMENT: Margaret [name] is a 51 y.o. female presenting today for follow up of Chronic Hepatitis B.  Last Hep B DNA in march 2022 was undectable though Hep B Surface Ag was remained reactive, she continues on entecavir 0.57m daily, was to have updated Hep B serologies in April 2023 but was lost to follow up for this. Will update these serologies today. Last LFTs in July with mildly elevated Alk PHos of 137, though this was during acute pneumonia, previously LFTs all WNL earlier that month. Notably last liver specific imaging with [facility] in 2021 with no focal abnormalities, though liver biopsy in 2016 with F2/F3 fibrosis, will proceed with [facility] elast[facility] to evaluate for potential progression of fibrosis to cirrhosis.    As patient has never had CRC screening, did discuss importance of colonoscopy or even cologuard with patient and caregiver present. They will have further discussion with patient's legal guardian regarding how to proceed with this.    PLAN:  Hep B surface Ag and Hep B DNA  2. Will make me aware of decision on colonoscopy  3. [place]  All questions were answered, patient verbalized understanding and is in agreement with plan as outlined above.    Follow Up: 6 months    L. Alver Sorrow, MSN, APRN, AGNP-C Adult-Gerontology Nurse Practitioner [facility]  I have reviewed the note and agree with the APP's assessment as described in this progress note.  Will assess the degree of fibrosis at this time.  Notably she is an [place] descendant, so after age 70 would be considered a high risk for [facility], and will require US of the liver and AFP every 6 months at that age.  Will continue Baraclude at current dosage, indefinitely.  Maylon Peppers, MD Gastroenterology and Hepatology [facility]

## 2022-01-06 NOTE — Patient Instructions (Addendum)
It was nice to meet you! We will continue entecavir 0.33m daily for now We will update Hepatitis B monitoring labs Please discuss colonoscopy/cologuard for colon cancer screening, you can [place] what you decide We will also update a special liver ultrasound as you have not had liver specific imaging in a few years   Follow up 6 months

## 2022-01-10 ENCOUNTER — Ambulatory Visit
Admission: RE | Admit: 2022-01-10 | Discharge: 2022-01-10 | Disposition: A | Discharge status: Home / Self Care | Payer: Medicare Other | Source: Ambulatory Visit | Attending: Family Medicine | Admitting: Family Medicine

## 2022-01-10 DIAGNOSIS — Z1231 Encounter for screening mammogram for malignant neoplasm of breast: Secondary | ICD-10-CM | POA: Diagnosis not present

## 2022-01-13 ENCOUNTER — Ambulatory Visit (HOSPITAL_COMMUNITY): Payer: Medicare Other

## 2022-01-14 ENCOUNTER — Encounter: Payer: Self-pay | Admitting: Family Medicine

## 2022-01-14 ENCOUNTER — Ambulatory Visit (INDEPENDENT_AMBULATORY_CARE_PROVIDER_SITE_OTHER): Payer: Medicare Other | Admitting: Family Medicine

## 2022-01-14 VITALS — BP 115/74 | HR 120 | Temp 97.2°F | Ht 66.0 in | Wt 107.0 lb

## 2022-01-14 DIAGNOSIS — E782 Mixed hyperlipidemia: Secondary | ICD-10-CM

## 2022-01-14 DIAGNOSIS — J432 Centrilobular emphysema: Secondary | ICD-10-CM

## 2022-01-14 DIAGNOSIS — E559 Vitamin D deficiency, unspecified: Secondary | ICD-10-CM

## 2022-01-14 DIAGNOSIS — F79 Unspecified intellectual disabilities: Secondary | ICD-10-CM

## 2022-01-14 DIAGNOSIS — F209 Schizophrenia, unspecified: Secondary | ICD-10-CM

## 2022-01-14 MED ORDER — VITAMIN D3 50 MCG (2000 UT) PO CAPS: 2000.0000 [IU] | ORAL_CAPSULE | Freq: Every day | ORAL | 99 refills | Status: DC

## 2022-01-14 MED ORDER — ACETAMINOPHEN 500 MG PO TABS: 500.0000 mg | ORAL_TABLET | Freq: Four times a day (QID) | ORAL | 1 refills | Status: AC | PRN

## 2022-01-14 NOTE — Progress Notes (Signed)
BP 115/74   Pulse (!) 120   Temp (!) 97.2 F (36.2 C)   Ht _0  (1.676 m)   Wt 107 lb (48.5 kg)   SpO2 95%   BMI 17.27 kg/m    Subjective:   [name], female    DOB: 01-Jul-1970, 51 y.o.   MRN: [id number]  HPI: Margaret [name] is a 51 y.o. female presenting on 01/14/2022 for Medical Management of Chronic Issues and Hyperlipidemia   HPI Hyperlipidemia Patient is coming in for recheck of his hyperlipidemia. The patient is currently taking fish oils. They deny any issues with myalgias or history of liver damage from it. They deny any focal numbness or weakness or chest pain.   COPD Patient is coming in for COPD recheck today.  He is currently on Spiriva and albuterol as needed and Flonase.  He has a mild chronic cough but denies any major coughing spells or wheezing spells.  He has 0 nighttime symptoms per week and 0 daytime symptoms per week currently.   Vitamin D deficiency recheck today  Patient has intellectual disability and schizophrenia and sees psychiatry to help manage this.  Relevant past medical, surgical, family and social history reviewed and updated as indicated. Interim medical history since our last visit reviewed. Allergies and medications reviewed and updated.  Review of Systems  Constitutional:  Negative for chills and fever.  HENT:  Negative for congestion, ear discharge and ear pain.   Eyes:  Negative for redness and visual disturbance.  Respiratory:  Negative for chest tightness and shortness of breath.   Cardiovascular:  Negative for chest pain and leg swelling.  Genitourinary:  Negative for difficulty urinating and dysuria.  Musculoskeletal:  Negative for back pain and gait problem.  Skin:  Negative for rash.  Neurological:  Negative for light-headedness and headaches.  Psychiatric/Behavioral:  Positive for confusion, decreased concentration and dysphoric mood. Negative for agitation, behavioral problems, self-injury, sleep disturbance  and suicidal ideas. The patient is nervous/anxious.   All other systems reviewed and are negative.   Per HPI unless specifically indicated above   Allergies as of 01/14/2022       Reactions   Lactose Nausea And Vomiting   GI upset   Olanzapine    Other reaction(s): UNKNOWN   Tuberculin, Ppd    Other reaction(s): UNKNOWN   Milk (cow) Nausea And Vomiting   Other reaction(s): GI upset        Medication List        Accurate as of January 14, 2022  2:52 PM. If you have any questions, ask your nurse or doctor.          acetaminophen 500 MG tablet Commonly known as: TYLENOL Take 1 tablet (500 mg total) by mouth every 6 (six) hours as needed for moderate pain, fever or headache. Started by: Fransisca Kaufmann Laya Letendre, MD   Amantadine HCl 100 MG tablet Take 100 mg by mouth every morning.   clozapine 200 MG tablet Commonly known as: CLOZARIL TAKE 2 TABLETS BY MOUTH AT BEDTIME.   entecavir 0.5 MG tablet Commonly known as: BARACLUDE TAKE (1) TABLET BY MOUTH ONCE DAILY. What changed: See the new instructions.   feeding supplement Liqd Take 1 Container by mouth 3 (three) times daily between meals. 237 mls Tid between meals.   Fish Oil-Vitamin D 1200-1000 MG-UNIT Caps TAKE 1 CAPSULE BY MOUTH TWICE DAILY.   FLUoxetine 20 MG capsule Commonly known as: PROZAC TAKE 3 CAPSULES BY MOUTH ONCE DAILY.  fluticasone 50 MCG/ACT nasal spray Commonly known as: FLONASE SPRAY 2 SPRAYS IN RIGHT NOSTRIL ONLY TWICE DAILY.   fluticasone-salmeterol 115-21 MCG/ACT inhaler Commonly known as: Advair HFA Inhale 2 puffs into the lungs 2 (two) times daily.   guaiFENesin 600 MG 12 hr tablet Commonly known as: Mucinex Take 1 tablet (600 mg total) by mouth 2 (two) times daily as needed for cough or to loosen phlegm.   levonorgestrel 20 MCG/24HR IUD Commonly known as: MIRENA 1 each by Intrauterine route once.   loratadine 10 MG tablet Commonly known as: Claritin Take 1 tablet (10 mg total) by  mouth daily.   Minerin Creme Crea Apply 1 Application topically See admin instructions. Apply moderate amount topically to dry skin on soles and heels of both feet after shower   mirtazapine 15 MG tablet Commonly known as: REMERON Take 15 mg by mouth at bedtime.   polyethylene glycol powder 17 GM/SCOOP powder Commonly known as: GoodSense ClearLax Take 127.5 g by mouth 2 (two) times daily. What changed:  how much to take additional instructions   Spiriva Respimat 1.25 MCG/ACT Aers Generic drug: Tiotropium Bromide Monohydrate Inhale 2 puffs into the lungs daily.   Sterile Gauze 3"X3" Pads 2 packets by Does not apply route daily.   Vitamin D3 50 MCG (2000 UT) capsule Take 1 capsule (2,000 Units total) by mouth daily.   vitamin E 200 UNIT capsule Take 1 capsule (200 Units total) by mouth daily.         Objective:   BP 115/74   Pulse (!) 120   Temp (!) 97.2 F (36.2 C)   Ht _0  (1.676 m)   Wt 107 lb (48.5 kg)   SpO2 95%   BMI 17.27 kg/m   Wt Readings from Last 3 Encounters:  01/14/22 107 lb (48.5 kg)  01/06/22 105 lb 14.4 oz (48 kg)  12/28/21 107 lb 3.2 oz (48.6 kg)    Physical Exam Vitals and nursing note reviewed.  Constitutional:      General: She is not in acute distress.    Appearance: She is well-developed. She is not diaphoretic.  Eyes:     Conjunctiva/sclera: Conjunctivae normal.  Cardiovascular:     Rate and Rhythm: Normal rate and regular rhythm.     Heart sounds: Normal heart sounds. No murmur heard. Pulmonary:     Effort: Pulmonary effort is normal. No respiratory distress.     Breath sounds: Normal breath sounds. No wheezing.  Musculoskeletal:        General: No tenderness. Normal range of motion.  Skin:    General: Skin is warm and dry.     Findings: No rash.  Neurological:     Mental Status: She is alert and oriented to person, place, and time.     Coordination: Coordination normal.  Psychiatric:        Behavior: Behavior is  hyperactive. Behavior is cooperative.       Assessment & Plan:   Problem List Items Addressed This Visit       Respiratory   Centrilobular emphysema ([place])   Relevant Orders   CBC with Differential/Platelet   CMP14+EGFR   Lipid panel     Other   Hyperlipidemia - Primary   Relevant Orders   CBC with Differential/Platelet   CMP14+EGFR   Lipid panel   Schizophrenia ([place])   Relevant Orders   CBC with Differential/Platelet   CMP14+EGFR   Lipid panel   Vitamin D deficiency   Intellectual disability  Continue current medicine, no changes, will do blood work today.  Follow up plan: Return if symptoms worsen or fail to improve, for Needs physical and 32-monthappointment, recheck for cholesterol..  Counseling provided for all of the vaccine components Orders Placed This Encounter  Procedures   CBC with Differential/Platelet   CMP14+EGFR   Lipid panel    JCaryl Pina MD [facility] 01/14/2022, 2:52 PM

## 2022-01-15 LAB — CBC WITH DIFFERENTIAL/PLATELET
Basophils Absolute: 0.1 10*3/uL (ref 0.0–0.2)
Basos: 1 %
EOS (ABSOLUTE): 0.1 10*3/uL (ref 0.0–0.4)
Eos: 1 %
Hematocrit: 34.9 % (ref 34.0–46.6)
Hemoglobin: 11.3 g/dL (ref 11.1–15.9)
Immature Grans (Abs): 0 10*3/uL (ref 0.0–0.1)
Immature Granulocytes: 0 %
Lymphocytes Absolute: 2.1 10*3/uL (ref 0.7–3.1)
Lymphs: 13 %
MCH: 27.6 pg (ref 26.6–33.0)
MCHC: 32.4 g/dL (ref 31.5–35.7)
MCV: 85 fL (ref 79–97)
Monocytes Absolute: 0.8 10*3/uL (ref 0.1–0.9)
Monocytes: 5 %
Neutrophils Absolute: 13.4 10*3/uL — ABNORMAL HIGH (ref 1.4–7.0)
Neutrophils: 80 %
Platelets: 349 10*3/uL (ref 150–450)
RBC: 4.09 x10E6/uL (ref 3.77–5.28)
RDW: 13.5 % (ref 11.7–15.4)
WBC: 16.7 10*3/uL — ABNORMAL HIGH (ref 3.4–10.8)

## 2022-01-15 LAB — CMP14+EGFR
ALT: 17 IU/L (ref 0–32)
AST: 18 IU/L (ref 0–40)
Albumin/Globulin Ratio: 1.2 (ref 1.2–2.2)
Albumin: 3.6 g/dL — ABNORMAL LOW (ref 3.9–4.9)
Alkaline Phosphatase: 177 IU/L — ABNORMAL HIGH (ref 44–121)
BUN/Creatinine Ratio: 23 (ref 9–23)
BUN: 17 mg/dL (ref 6–24)
Bilirubin Total: <0.2 mg/dL (ref 0.0–1.2)
CO2: 26 mmol/L (ref 20–29)
Calcium: 9 mg/dL (ref 8.7–10.2)
Chloride: 102 mmol/L (ref 96–106)
Creatinine, Ser: 0.73 mg/dL (ref 0.57–1.00)
Globulin, Total: 3.1 g/dL (ref 1.5–4.5)
Glucose: 129 mg/dL — ABNORMAL HIGH (ref 70–99)
Potassium: 3.9 mmol/L (ref 3.5–5.2)
Sodium: 141 mmol/L (ref 134–144)
Total Protein: 6.7 g/dL (ref 6.0–8.5)
eGFR: 101 mL/min/{1.73_m2} (ref >59)

## 2022-01-15 LAB — LIPID PANEL
Chol/HDL Ratio: 2.7 ratio (ref 0.0–4.4)
Cholesterol, Total: 144 mg/dL (ref 100–199)
HDL: 53 mg/dL (ref >39)
LDL Chol Calc (NIH): 74 mg/dL (ref 0–99)
Triglycerides: 91 mg/dL (ref 0–149)
VLDL Cholesterol Cal: 17 mg/dL (ref 5–40)

## 2022-01-18 ENCOUNTER — Telehealth: Payer: Self-pay | Admitting: Family Medicine

## 2022-01-18 NOTE — Telephone Encounter (Signed)
Routed lab tests results to Dr. Tamera Punt. Mickel Baas with group home made aware.

## 2022-01-20 LAB — HEPATITIS B DNA, ULTRAQUANTITATIVE, PCR: HBV DNA SERPL PCR-ACNC: NOT DETECTED IU/mL

## 2022-01-20 LAB — HEPATITIS B SURFACE ANTIGEN

## 2022-01-24 ENCOUNTER — Other Ambulatory Visit (INDEPENDENT_AMBULATORY_CARE_PROVIDER_SITE_OTHER): Payer: Self-pay | Admitting: Gastroenterology

## 2022-01-24 DIAGNOSIS — B181 Chronic viral hepatitis B without delta-agent: Secondary | ICD-10-CM

## 2022-01-25 ENCOUNTER — Ambulatory Visit (HOSPITAL_COMMUNITY)
Admission: RE | Admit: 2022-01-25 | Discharge: 2022-01-25 | Disposition: A | Discharge status: Home / Self Care | Payer: Medicare Other | Source: Ambulatory Visit | Attending: Pulmonary Disease | Admitting: Pulmonary Disease

## 2022-01-25 ENCOUNTER — Other Ambulatory Visit (INDEPENDENT_AMBULATORY_CARE_PROVIDER_SITE_OTHER): Payer: Self-pay

## 2022-01-25 DIAGNOSIS — J984 Other disorders of lung: Secondary | ICD-10-CM | POA: Diagnosis not present

## 2022-01-25 DIAGNOSIS — J189 Pneumonia, unspecified organism: Secondary | ICD-10-CM | POA: Diagnosis present

## 2022-01-25 DIAGNOSIS — J209 Acute bronchitis, unspecified: Secondary | ICD-10-CM | POA: Insufficient documentation

## 2022-01-25 DIAGNOSIS — K7400 Hepatic fibrosis, unspecified: Secondary | ICD-10-CM

## 2022-01-25 DIAGNOSIS — J479 Bronchiectasis, uncomplicated: Secondary | ICD-10-CM | POA: Diagnosis not present

## 2022-01-25 DIAGNOSIS — J440 Chronic obstructive pulmonary disease with acute lower respiratory infection: Secondary | ICD-10-CM | POA: Diagnosis not present

## 2022-01-25 DIAGNOSIS — B181 Chronic viral hepatitis B without delta-agent: Secondary | ICD-10-CM

## 2022-01-25 DIAGNOSIS — J449 Chronic obstructive pulmonary disease, unspecified: Secondary | ICD-10-CM | POA: Diagnosis not present

## 2022-01-26 ENCOUNTER — Encounter: Payer: Self-pay | Admitting: Nurse Practitioner

## 2022-01-26 ENCOUNTER — Telehealth: Payer: Self-pay | Admitting: Nurse Practitioner

## 2022-01-26 ENCOUNTER — Other Ambulatory Visit: Payer: Self-pay | Admitting: Nurse Practitioner

## 2022-01-26 ENCOUNTER — Ambulatory Visit (INDEPENDENT_AMBULATORY_CARE_PROVIDER_SITE_OTHER): Payer: Medicare Other | Admitting: Nurse Practitioner

## 2022-01-26 VITALS — BP 102/64 | HR 94 | Ht 62.0 in | Wt 109.6 lb

## 2022-01-26 DIAGNOSIS — J984 Other disorders of lung: Secondary | ICD-10-CM

## 2022-01-26 DIAGNOSIS — J189 Pneumonia, unspecified organism: Secondary | ICD-10-CM | POA: Diagnosis not present

## 2022-01-26 DIAGNOSIS — J432 Centrilobular emphysema: Secondary | ICD-10-CM

## 2022-01-26 MED ORDER — AMOXICILLIN-POT CLAVULANATE 875-125 MG PO TABS: 1.0000 | ORAL_TABLET | Freq: Two times a day (BID) | ORAL | 0 refills | Status: DC

## 2022-01-26 MED ORDER — PROBIOTIC 1-250 BILLION-MG PO CAPS: 1.0000 | ORAL_CAPSULE | Freq: Every day | ORAL | 0 refills | Status: DC

## 2022-01-26 MED ORDER — PROBIOTIC 1-250 BILLION-MG PO CAPS: 1.0000 | ORAL_CAPSULE | Freq: Every day | ORAL | 0 refills | Status: AC

## 2022-01-26 MED ORDER — ALBUTEROL SULFATE (2.5 MG/3ML) 0.083% IN NEBU: 2.5000 mg | INHALATION_SOLUTION | Freq: Four times a day (QID) | RESPIRATORY_TRACT | 5 refills | Status: AC | PRN

## 2022-01-26 MED ORDER — GUAIFENESIN ER 600 MG PO TB12: 1200.0000 mg | ORAL_TABLET | Freq: Two times a day (BID) | ORAL | 2 refills | Status: DC

## 2022-01-26 NOTE — Progress Notes (Signed)
I called the pharmacy per Roxan Diesel recommendations and let them know she would be taking it for 28 days and not 14. The pharmacy took the message.

## 2022-01-26 NOTE — Assessment & Plan Note (Signed)
Initially hospitalized in October 2022 with multifocal pneumonia; first stay complicated by empyema on the right requiring CT. Re-admission from 03/03/2021-03/09/2021.  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 Treated with unasyn and 3 weeks of augumentin. She presents today with worsening productive cough. CT chest from yesterday, 10/26 with increasing RML cavitary mass when compared to previous; awaiting final read. She will need extended abx course with augmentin x 4 weeks. Sputum culture collected today; although, previous have been no growth. She will be set up for outpatient bronchoscopy for further evaluation. Referral placed to ID. Discussed case with Dr. Loanne Drilling. Advised on mucociliary clearance therapies.   Patient Instructions  Continue Albuterol inhaler 2 puffs or 3 mL neb every 6 hours as needed for shortness of breath or wheezing. Notify if symptoms persist despite rescue inhaler/neb use. Use nebulizer at least twice daily until seen back Continue Spiriva 2 puffs daily Continue Advair 2 puffs Twice daily. Brush tongue and rinse mouth afterwards Continue flonase nasal spray 2 sprays each nostril daily Continue loratadine 10 mg daily for allergies  Mucinex 1200 mg Twice daily for chest congestion/cough Augmentin 1 tab Twice daily for 28 days. Take with food.  Daily probiotic once a day while on antibiotics  Flutter valve 2-3 times a day following nebulizer   We will call you about your sputum culture results.  Schedule for outpatient bronchoscopy - someone will contact you for scheduling  Referral to infectious disease placed today   Follow up after bronchoscopy with Dr. Loanne Drilling or [place]. If symptoms do not improve or worsen, please contact office for sooner follow up or seek emergency care.

## 2022-01-26 NOTE — Progress Notes (Signed)
@Patient  ID: Margaret [name], female    DOB: 11/23/1970, 51 y.o.   MRN: [id number]  Chief Complaint  Patient presents with   Follow-up    Pt f/u to discuss CT results. Breathing is okay, caregiver reports she is still coughing w/ phlegm.     Referring provider: Dettinger, Fransisca Kaufmann, MD  HPI: 51 year old female, never smoker followed for emphysema, hx of childhood TB, multifocal pneumonia in setting of aspiration, pleural effusion, and empyema. She is a patient of Dr. Cordelia Pen and last seen in office 12/28/2021. Past medical history significant for schizophrenia, complex ovarian cyst, intellectual disability, protein calorie malnutrition, hepatitis B. She has had two recent hospitalizations related to multifocal pneumonia, most recently 03/03/2021 to 03/09/2021. First stay complicated by empyema requiring chest tube.  She was again hospitalized 10/07/2021-10/09/2021 for multiple episodes of emesis and abdominal pain. She was found to have a new RML cavitary pneumonia measuring 5.7 cm with multifocal nodular opacities. She was treated with unasyn and discharged on 7 day course of abx. She was seen for follow up with Dr. Loanne Drilling on 7/19 with reports of persistent, productive cough. Antibiotic regimen was extended for an additional 14 days with augmentin.   TEST/EVENTS:  03/04/2021 CT chest: b/l atelectasis and infiltrate, small b/l pleural effusions 03/22/2022 CXR: bibasilar opacities, unchanged  04/23/2021 CT chest: improved small b/l pleural effusions with loculation. Overall improved b/l airspace disease; however, interval RUL opacity noted 05/20/2021 CXR 2 view: opacities in b/l lower lobes. LLL atelectasis present.  06/11/2021 CT chest wo contrast: interval improvement of consolidation in the RUL and left greater than right lower lobes. Extensive patchy consolidative and tree-in-bud ground-glass nodularity throughout the left greater than right lower lobes and RUL. Decreased b/l pleural effusions. Few  new patchy areas of consolidation in the RLL.  10/07/2021 CT abd/pelvis: new centrally cavitary consolidation in the RML measuring 5.7 cm; additional multifocal nodular consolidations in the b/l lung bases with bronchial wall thickening and mucous plugging. Evidence of enteritis   01/25/2022 CT chest: worsening consolidative and reticulonodular opacities, throughout the lung bases. Interval cavitary features in the RUL. Airway thickening and cylindrical btx with mucoid impaction.   12/28/2021: OV with Dr. Loanne Drilling. Cough improved with previous abx course. Now with persistent cough; more productive over the past few days. Denies fevers, chills or SOB. Never had CT - reordered today. Prednisone burst for possible acute on chronic bronchitis. Add advair to regimen. Continue spiriva.   01/26/2022: Today - follow up Patient presents today with caregiver for her follow up to discuss CT scan results. RML consolidation has increased in size. She has a persistent, productive cough that has become more frequent over the past few weeks. Occasional wheezing and shortness of breath with strenuous activity. Denies any fevers, chills, night sweats, hemoptysis. No increased fatigue or lethargy. She has been eating well; eats too fast per her caregiver. Her caregiver does note that she doesn't seem to be drinking as many fluids as normal; they've been trying to ensure she stays hydrated. She's using Spiriva and Advair. Hasn't been using mucinex as it was changed to as needed. She's on claritin for allergies and flonase for postnasal drainage control.   Allergies  Allergen Reactions   Lactose Nausea And Vomiting    GI upset   Olanzapine     Other reaction(s): UNKNOWN   Tuberculin, Ppd     Other reaction(s): UNKNOWN   Milk (Cow) Nausea And Vomiting    Other reaction(s): GI upset  Immunization History  Administered Date(s) Administered   Influenza,inj,Quad PF,6+ Mos 12/30/2015, 12/30/2016, 03/14/2018, 02/20/2019,  12/28/2021   Influenza-Unspecified 01/12/2015, 01/07/2020, 01/09/2021   Moderna Covid-19 Vaccine Bivalent Booster 33yr & up 02/15/2021   Moderna Sars-Covid-2 Vaccination 06/10/2019, 07/09/2019, 02/11/2020    Past Medical History:  Diagnosis Date   Hematuria 11/12/2014   Hepatitis B carrier ([place]    Hypothyroidism    Impulse disorder    IUD (intrauterine device) in place 11/12/2014   [facility]  Mild intellectual disability    [place]    [place]    Tuberculosis    as a child   Urinary frequency 11/12/2014    Tobacco History: Social History   Tobacco Use  Smoking Status Never   Passive exposure: Never  Smokeless Tobacco Never   Counseling given: Not Answered   Outpatient Medications Prior to Visit  Medication Sig Dispense Refill   acetaminophen (TYLENOL) 500 MG tablet Take 1 tablet (500 mg total) by mouth every 6 (six) hours as needed for moderate pain, fever or headache. 90 tablet 1   Amantadine HCl 100 MG tablet Take 100 mg by mouth every morning.     Cholecalciferol (VITAMIN D3) 50 MCG (2000 UT) capsule Take 1 capsule (2,000 Units total) by mouth daily. 30 capsule PRN   clozapine (CLOZARIL) 200 MG tablet TAKE 2 TABLETS BY MOUTH AT BEDTIME. 60 tablet 1   entecavir (BARACLUDE) 0.5 MG tablet TAKE (1) TABLET BY MOUTH ONCE DAILY. (Patient taking differently: Take 0.5 mg by mouth daily.) 30 tablet 5   feeding supplement (BOOST HIGH PROTEIN) LIQD Take 1 Container by mouth 3 (three) times daily between meals. 237 mls Tid between meals.     Fish Oil-Cholecalciferol (FISH OIL-VITAMIN D) 1200-1000 MG-UNIT CAPS TAKE 1 CAPSULE BY MOUTH TWICE DAILY. 60 capsule 11   FLUoxetine (PROZAC) 20 MG capsule TAKE 3 CAPSULES BY MOUTH ONCE DAILY. 90 capsule 0   fluticasone (FLONASE) 50 MCG/ACT nasal spray SPRAY 2 SPRAYS IN RIGHT NOSTRIL ONLY TWICE DAILY. 16 g 2   fluticasone-salmeterol (ADVAIR HFA) 115-21 MCG/ACT inhaler Inhale 2 puffs into the lungs 2 (two)  times daily. 1 each 5   Gauze Pads & Dressings (STERILE GAUZE) 3"X3" PADS 2 packets by Does not apply route daily. 30 each 1   levonorgestrel (MIRENA) 20 MCG/24HR IUD 1 each by Intrauterine route once.     loratadine (CLARITIN) 10 MG tablet Take 1 tablet (10 mg total) by mouth daily. 90 tablet 3   mirtazapine (REMERON) 15 MG tablet Take 15 mg by mouth at bedtime.     polyethylene glycol powder (GOODSENSE CLEARLAX) 17 GM/SCOOP powder Take 127.5 g by mouth 2 (two) times daily. (Patient taking differently: Take 1 Container by mouth 2 (two) times daily. 17 grams Bid)     Skin Protectants, Misc. (MINERIN CREME) CREA Apply 1 Application topically See admin instructions. Apply moderate amount topically to dry skin on soles and heels of both feet after shower     Tiotropium Bromide Monohydrate (SPIRIVA RESPIMAT) 1.25 MCG/ACT AERS Inhale 2 puffs into the lungs daily. 4 g 5   vitamin E 200 UNIT capsule Take 1 capsule (200 Units total) by mouth daily. 30 capsule 11   guaiFENesin (MUCINEX) 600 MG 12 hr tablet Take 1 tablet (600 mg total) by mouth 2 (two) times daily as needed for cough or to loosen phlegm. 60 tablet 2   No facility-administered medications prior to visit.     Review of Systems:  Constitutional: No weight loss or gain, night sweats, fevers, chills, fatigue, or lassitude. HEENT: No headaches, difficulty swallowing, tooth/dental problems, or sore throat. No sneezing, itching, ear ache, nasal congestion, or post nasal drip CV:  No chest pain, orthopnea, PND, swelling in lower extremities, anasarca, dizziness, palpitations, syncope Resp: + minimal shortness of breath with exertion; productive cough (cream to yellow).  No hemoptysis. No wheezing.  No chest wall deformity GI:  No heartburn, indigestion, abdominal pain, nausea, vomiting, diarrhea, change in bowel habits, loss of appetite, bloody stools.  Neuro: No dizziness or lightheadedness.  Psych: No depression or anxiety. Mood stable.      Physical Exam:  BP 102/64   Pulse 94   Ht 5' 2"  (1.575 m)   Wt 109 lb 9.6 oz (49.7 kg)   SpO2 99%   BMI 20.05 kg/m   GEN: Pleasant, interactive, well-appearing; in no acute distress. HEENT:  Normocephalic and atraumatic. PERRLA. Sclera white. Nasal turbinates pink, moist and patent bilaterally. No rhinorrhea present. Oropharynx pink and moist, without exudate or edema. No lesions, ulcerations, or postnasal drip.  NECK:  Supple w/ fair ROM. No JVD present. Normal carotid impulses w/o bruits. Thyroid symmetrical with no goiter or nodules palpated. No lymphadenopathy.   CV: RRR, no m/r/g, no peripheral edema. Pulses intact, +2 bilaterally. No cyanosis, pallor or clubbing. PULMONARY:  Unlabored, regular breathing. Rhonchi with end expiratory wheeze RLL; diminished LLL otherwise clear. No accessory muscle use. No dullness to percussion. GI: BS present and normoactive. Soft, non-tender to palpation.  MSK: No erythema, warmth or tenderness. No deformities or joint swelling noted.  Neuro: A/Ox3. No focal deficits noted.   Skin: Warm, no lesions or rashe Psych: Normal affect and behavior. Judgement and thought content appropriate.     Lab Results:  CBC    Component Value Date/Time   WBC 14.6 (H) 01/26/2022 1403   WBC 15.0 (H) 10/08/2021 0546   RBC 4.24 01/26/2022 1403   RBC 4.12 10/08/2021 0546   HGB 11.7 01/26/2022 1403   HCT 35.6 01/26/2022 1403   PLT 353 01/26/2022 1403   MCV 84 01/26/2022 1403   MCH 27.6 01/26/2022 1403   MCH 25.7 (L) 10/08/2021 0546   MCHC 32.9 01/26/2022 1403   MCHC 29.4 (L) 10/08/2021 0546   RDW 12.5 01/26/2022 1403   LYMPHSABS 2.3 01/26/2022 1403   MONOABS 1.1 (H) 10/07/2021 1505   EOSABS 0.2 01/26/2022 1403   BASOSABS 0.1 01/26/2022 1403    BMET    Component Value Date/Time   NA 139 01/26/2022 1403   K 4.2 01/26/2022 1403   CL 96 01/26/2022 1403   CO2 25 01/26/2022 1403   GLUCOSE 194 (H) 01/26/2022 1403   GLUCOSE 77 10/08/2021 0546    BUN 9 01/26/2022 1403   CREATININE 0.67 01/26/2022 1403   CREATININE 0.73 06/30/2020 1054   CALCIUM 8.8 01/26/2022 1403   GFRNONAA >60 10/08/2021 0546   GFRAA 102 01/08/2020 1045    BNP    Component Value Date/Time   BNP 70.2 02/01/2021 2146     Imaging:  CT Chest Wo Contrast  Result Date: 01/27/2022 CLINICAL DATA:  Cavitary pneumonia.  Acute bronchitis with COPD EXAM: CT CHEST WITHOUT CONTRAST TECHNIQUE: Multidetector CT imaging of the chest was performed following the standard protocol without IV contrast. RADIATION DOSE REDUCTION: This exam was performed according to the departmental dose-optimization program which includes automated exposure control, adjustment of the mA and/or kV according to patient size and/or use of iterative reconstruction technique. COMPARISON:  06/11/2021 FINDINGS: Cardiovascular: Normal heart size. No pericardial effusion. No significant vascular finding without contrast Mediastinum/Nodes: Negative for mass or adenopathy. Calcified right paratracheal lymph node. Lungs/Pleura: Consultative and reticulonodular opacities throughout all lobes, greatest at the bases. There is airway thickening and patchy cylindrical bronchiectasis with mucoid impaction of especially lower lobe airways. Some of the opacity, especially at the bases, was present on prior and is suspicious for atypical infection, with interval cavitary features in the posterior aspect of the right upper lobe. No effusion or pneumothorax. Upper Abdomen: No acute finding Musculoskeletal: Remote rib fractures. Remote left clavicle fracture with fixation. Remote left scapular fracture. Motion artifact, reportedly due to coughing. IMPRESSION: 1. Multifocal pneumonia which is much more extensive than 06/11/2021, presumably acute on chronic infection. 2. The chronic opacities are associated with mucoid impaction, airway thickening, and cylindrical bronchiectasis. Chronic atypical infection such as with mycobacterium  is a strong consideration. Additionally, there was a notable basal predilection on prior and chronic aspiration is also possible. Two areas of cavitation or seen in the posterior aspect of the right upper lobe. Electronically Signed   By: Jorje Guild M.D.   On: 01/27/2022 18:31   [place] ELASTOGRAPHY LIVER  Result Date: 01/27/2022 CLINICAL DATA:  Chronic hepatitis-B, history of F2/F3 fibrosis in 2016 EXAM: [place] ELASTOGRAPHY HEPATIC TECHNIQUE: Sonography of the liver was performed. In addition, ultrasound elastography evaluation of the liver was performed. A region of interest was placed within the right lobe of the liver. Following application of a compressive sonographic pulse, tissue compressibility was assessed. Multiple assessments were performed at the selected site. Median tissue compressibility was determined. Previously, hepatic stiffness was assessed by shear wave velocity. Based on recently published Society of Radiologists in Ultrasound consensus article, reporting is now recommended to be performed in the SI units of pressure (kiloPascals) representing hepatic stiffness/elasticity. The obtained result is compared to the published reference standards. (cACLD = compensated Advanced Chronic Liver Disease) COMPARISON:  CT abdomen and pelvis 10/07/2021 FINDINGS: Liver: Normal echogenicity without mass or nodularity. No intrahepatic biliary dilatation. Portal vein is patent on color Doppler imaging with normal direction of blood flow towards the liver. ULTRASOUND HEPATIC ELASTOGRAPHY Device: Siemens Helix VTQ Patient position: Supine Transducer: DAX Number of measurements: 10 Hepatic segment:  8 Median kPa: 3.3 IQR: 1.0 IQR/Median kPa ratio: 0.30 Data quality:  Good Diagnostic category:  < or = 5 kPa: high probability of being normal The use of hepatic elastography is applicable to patients with viral hepatitis and non-alcoholic fatty liver disease. At this time, there is insufficient data for the referenced  cut-off values and use in other causes of liver disease, including alcoholic liver disease. Patients, however, may be assessed by elastography and serve as their own reference standard/baseline. In patients with non-alcoholic liver disease, the values suggesting compensated advanced chronic liver disease (cACLD) may be lower, and patients may need additional testing with elasticity results of 7-9 kPa. Please note that abnormal hepatic elasticity and shear wave velocities may also be identified in clinical settings other than with hepatic fibrosis, such as: acute hepatitis, elevated right heart and central venous pressures including use of beta blockers, veno-occlusive disease (Budd-Chiari), infiltrative processes such as mastocytosis/amyloidosis/infiltrative tumor/lymphoma, extrahepatic cholestasis, with hyperemia in the post-prandial state, and with liver transplantation. Correlation with patient history, laboratory data, and clinical condition recommended. Diagnostic Categories: < or =5 kPa: high probability of being normal < or =9 kPa: in the absence of other known clinical signs, rules out cACLD >9 kPa and ?13 kPa:  suggestive of cACLD, but needs further testing >13 kPa: highly suggestive of cACLD > or =17 kPa: highly suggestive of cACLD with an increased probability of clinically significant portal hypertension IMPRESSION: ULTRASOUND LIVER: No hepatic sonographic abnormalities. ULTRASOUND HEPATIC ELASTOGRAPHY: Median kPa:  3.3 Diagnostic category:  < or = 5 kPa: high probability of being normal Electronically Signed   By: Lavonia Dana M.D.   On: 01/27/2022 10:52   MM 3D SCREEN BREAST BILATERAL  Result Date: 01/11/2022 CLINICAL DATA:  Screening. EXAM: DIGITAL SCREENING BILATERAL MAMMOGRAM WITH TOMOSYNTHESIS AND CAD TECHNIQUE: Bilateral screening digital craniocaudal and mediolateral oblique mammograms were obtained. Bilateral screening digital breast tomosynthesis was performed. The images were evaluated with  computer-aided detection. COMPARISON:  Previous exam(s). ACR Breast Density Category d: The breast tissue is extremely dense, which lowers the sensitivity of mammography FINDINGS: There are no findings suspicious for malignancy. IMPRESSION: No mammographic evidence of malignancy. A result letter of this screening mammogram will be mailed directly to the patient. RECOMMENDATION: Screening mammogram in one year. (Code:SM-B-01Y) BI-RADS CATEGORY  1: Negative. Electronically Signed   By: Margarette Canada M.D.   On: 01/11/2022 15:06          No data to display          No results found for: "NITRICOXIDE"      Assessment & Plan:   Cavitary pneumonia Initially hospitalized in October 2022 with multifocal pneumonia; first stay complicated by empyema on the right requiring CT. Re-admission from 03/03/2021-03/09/2021.  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 Treated with unasyn and 3 weeks of augumentin. She presents today with worsening productive cough. CT chest from yesterday, 10/26 with increasing RML cavitary mass when compared to previous; awaiting final read. She will need extended abx course with augmentin x 4 weeks. Sputum culture collected today; although, previous have been no growth. She will be set up for outpatient bronchoscopy for further evaluation. Referral placed to ID. Discussed case with Dr. Loanne Drilling. Advised on mucociliary clearance therapies.   Patient Instructions  Continue Albuterol inhaler 2 puffs or 3 mL neb every 6 hours as needed for shortness of breath or wheezing. Notify if symptoms persist despite rescue inhaler/neb use. Use nebulizer at least twice daily until seen back Continue Spiriva 2 puffs daily Continue Advair 2 puffs Twice daily. Brush tongue and rinse mouth afterwards Continue flonase nasal spray 2 sprays each nostril daily Continue loratadine 10 mg daily for allergies  Mucinex 1200 mg  Twice daily for chest congestion/cough Augmentin 1 tab Twice daily for 28 days. Take with food.  Daily probiotic once a day while on antibiotics  Flutter valve 2-3 times a day following nebulizer   We will call you about your sputum culture results.  Schedule for outpatient bronchoscopy - someone will contact you for scheduling  Referral to infectious disease placed today   Follow up after bronchoscopy with Dr. Loanne Drilling or [place]. If symptoms do not improve or worsen, please contact office for sooner follow up or seek emergency care.     Centrilobular emphysema (HCC) No formal PFTs; emphysematous changes on imaging. She's on triple therapy regimen with Advair and Spiriva. Encouraged to use nebs twice daily to assist with mucociliary clearance. See above.    I spent 45 minutes of dedicated to the care of this patient on the date of this encounter to include pre-visit review of records, face-to-face time with the patient discussing conditions  above, post visit ordering of testing, clinical documentation with the electronic health record, making appropriate referrals as documented, and communicating necessary findings to members of the patients care team.  Margaret Bibles, NP 01/28/2022  Pt aware and understands NP's role.

## 2022-01-26 NOTE — Patient Instructions (Addendum)
Continue Albuterol inhaler 2 puffs or 3 mL neb every 6 hours as needed for shortness of breath or wheezing. Notify if symptoms persist despite rescue inhaler/neb use. Use nebulizer at least twice daily until seen back Continue Spiriva 2 puffs daily Continue Advair 2 puffs Twice daily. Brush tongue and rinse mouth afterwards Continue flonase nasal spray 2 sprays each nostril daily Continue loratadine 10 mg daily for allergies  Mucinex 1200 mg Twice daily for chest congestion/cough Augmentin 1 tab Twice daily for 28 days. Take with food.  Daily probiotic once a day while on antibiotics  Flutter valve 2-3 times a day following nebulizer   We will call you about your sputum culture results.  Schedule for outpatient bronchoscopy - someone will contact you for scheduling  Referral to infectious disease placed today   Follow up after bronchoscopy with Dr. Loanne Drilling or [place]. If symptoms do not improve or worsen, please contact office for sooner follow up or seek emergency care.

## 2022-01-27 ENCOUNTER — Ambulatory Visit (HOSPITAL_COMMUNITY)
Admission: RE | Admit: 2022-01-27 | Discharge: 2022-01-27 | Disposition: A | Discharge status: Home / Self Care | Payer: Medicare Other | Source: Ambulatory Visit | Attending: Gastroenterology | Admitting: Gastroenterology

## 2022-01-27 DIAGNOSIS — B181 Chronic viral hepatitis B without delta-agent: Secondary | ICD-10-CM | POA: Insufficient documentation

## 2022-01-27 DIAGNOSIS — K7400 Hepatic fibrosis, unspecified: Secondary | ICD-10-CM | POA: Diagnosis not present

## 2022-01-27 DIAGNOSIS — J432 Centrilobular emphysema: Secondary | ICD-10-CM | POA: Diagnosis not present

## 2022-01-27 LAB — CBC WITH DIFFERENTIAL/PLATELET
Basophils Absolute: 0.1 10*3/uL (ref 0.0–0.2)
Basos: 1 %
EOS (ABSOLUTE): 0.2 10*3/uL (ref 0.0–0.4)
Eos: 1 %
Hematocrit: 35.6 % (ref 34.0–46.6)
Hemoglobin: 11.7 g/dL (ref 11.1–15.9)
Immature Grans (Abs): 0 10*3/uL (ref 0.0–0.1)
Immature Granulocytes: 0 %
Lymphocytes Absolute: 2.3 10*3/uL (ref 0.7–3.1)
Lymphs: 15 %
MCH: 27.6 pg (ref 26.6–33.0)
MCHC: 32.9 g/dL (ref 31.5–35.7)
MCV: 84 fL (ref 79–97)
Monocytes Absolute: 0.7 10*3/uL (ref 0.1–0.9)
Monocytes: 5 %
Neutrophils Absolute: 11.4 10*3/uL — ABNORMAL HIGH (ref 1.4–7.0)
Neutrophils: 78 %
Platelets: 353 10*3/uL (ref 150–450)
RBC: 4.24 x10E6/uL (ref 3.77–5.28)
RDW: 12.5 % (ref 11.7–15.4)
WBC: 14.6 10*3/uL — ABNORMAL HIGH (ref 3.4–10.8)

## 2022-01-27 LAB — BASIC METABOLIC PANEL
BUN/Creatinine Ratio: 13 (ref 9–23)
BUN: 9 mg/dL (ref 6–24)
CO2: 25 mmol/L (ref 20–29)
Calcium: 8.8 mg/dL (ref 8.7–10.2)
Chloride: 96 mmol/L (ref 96–106)
Creatinine, Ser: 0.67 mg/dL (ref 0.57–1.00)
Glucose: 194 mg/dL — ABNORMAL HIGH (ref 70–99)
Potassium: 4.2 mmol/L (ref 3.5–5.2)
Sodium: 139 mmol/L (ref 134–144)
eGFR: 107 mL/min/{1.73_m2} (ref >59)

## 2022-01-27 NOTE — Telephone Encounter (Signed)
Spoke to caregiver & provided appt into for 02/02/22 @ 9:00 am - to check in by 6:30 AM.  NPO after midnight - small sips of water ok & not medication info.    Per Katie's request, I did contact Dr. Henreitta Leber office to let them know that pt will need to r/s their appt if it should run over.  Provided Dr. Henreitta Leber phone number to caregive as well.

## 2022-01-28 ENCOUNTER — Other Ambulatory Visit (INDEPENDENT_AMBULATORY_CARE_PROVIDER_SITE_OTHER): Payer: Self-pay

## 2022-01-28 ENCOUNTER — Encounter: Payer: Self-pay | Admitting: Nurse Practitioner

## 2022-01-28 NOTE — Assessment & Plan Note (Signed)
No formal PFTs; emphysematous changes on imaging. She's on triple therapy regimen with Advair and Spiriva. Encouraged to use nebs twice daily to assist with mucociliary clearance. See above.

## 2022-01-29 LAB — RESPIRATORY CULTURE OR RESPIRATORY AND SPUTUM CULTURE
MICRO NUMBER:: 14099425
SPECIMEN QUALITY:: ADEQUATE

## 2022-01-31 ENCOUNTER — Other Ambulatory Visit: Payer: Self-pay

## 2022-01-31 ENCOUNTER — Other Ambulatory Visit: Payer: Self-pay | Admitting: Nurse Practitioner

## 2022-01-31 DIAGNOSIS — A488 Other specified bacterial diseases: Secondary | ICD-10-CM

## 2022-01-31 DIAGNOSIS — J189 Pneumonia, unspecified organism: Secondary | ICD-10-CM

## 2022-01-31 MED ORDER — SULFAMETHOXAZOLE-TRIMETHOPRIM 800-160 MG PO TABS: 1.0000 | ORAL_TABLET | Freq: Two times a day (BID) | ORAL | 0 refills | Status: AC

## 2022-01-31 NOTE — Progress Notes (Signed)
Please notify that her sputum culture grew out a bacterial infection called serratia marcescens. It is resistant to augmentin which means that it's not going to respond to the antibiotics we have had her on. I am changing her to bactrim DS 1 tab Twice daily for at least 4 weeks. We will see what her bronchoscopy reveals and recommendations from ID. Continue the daily probiotic. Make sure she has follow up end of next week. Thanks.

## 2022-01-31 NOTE — H&P (View-Only) (Signed)
Please notify that her sputum culture grew out a bacterial infection called serratia marcescens. It is resistant to augmentin which means that it's not going to respond to the antibiotics we have had her on. I am changing her to bactrim DS 1 tab Twice daily for at least 4 weeks. We will see what her bronchoscopy reveals and recommendations from ID. Continue the daily probiotic. Make sure she has follow up end of next week. Thanks.

## 2022-02-01 ENCOUNTER — Encounter (HOSPITAL_COMMUNITY): Payer: Self-pay | Admitting: Internal Medicine

## 2022-02-01 NOTE — Progress Notes (Signed)
PCP - Dr Vonna Kotyk Dettinger Cardiologist - n/a Pulmonary - Dr Rayann Heman  Gastroenterology - Dr Hildred [facility] HEM-[facility] - Dr Derek Jack  CT Chest x-ray - 01/25/22 EKG - 10/07/21 Stress Test - n/a ECHO - n/a Cardiac Cath - n/a  ICD Pacemaker/Loop - n/a  Sleep Study -  n/a CPAP - none  Anesthesia review: Yes  STOP now taking any Aspirin (unless otherwise instructed by your surgeon), Aleve, Naproxen, Ibuprofen, Motrin, Advil, Goody's, BC's, all herbal medications, fish oil, and all vitamins.   Coronavirus Screening Does the patient have any of the following symptoms:  Cough Yes Fever (>100.83F)  yes/no: No Runny nose yes/no: No Sore throat yes/no: No Difficulty breathing/shortness of breath  yes/no: No  Has the patient traveled in the last 14 days and where? yes/no: No  Ms. LaToya Pettie at [facility] verbalized understanding of instructions that were given via phone.  Instructions were faxed to [phone].

## 2022-02-01 NOTE — Anesthesia Preprocedure Evaluation (Signed)
Anesthesia Evaluation Anesthesia Physical Anesthesia Plan  ASA:   Anesthesia Plan:    Post-op Pain Management:    Induction:   PONV Risk Score and Plan:   Airway Management Planned:   Additional Equipment:   Intra-op Plan:   Post-operative Plan:   Informed Consent:   Plan Discussed with:   Anesthesia Plan Comments: (PAT note written 02/01/2022 by Myra Gianotti, PA-C. )        Anesthesia Quick Evaluation

## 2022-02-01 NOTE — Progress Notes (Signed)
Anesthesia Chart Review:  Case: [id number] Date/Time: 02/02/22 0900   Procedure: VIDEO BRONCHOSCOPY WITH FLUORO   Anesthesia type: General   Pre-op diagnosis: CT changes   Location: [facility] ROOM [facility] / [facility]   Surgeons: Candee Furbish, MD       DISCUSSION: Patient is a 51 year old female scheduled for the above procedure.  History includes never smoker, hypothyroidism, childhood TB, hepatitis B, intellectual disability ("mild"), impulse disorder, tracheostomy (history of). She is adopted at age 27. Notes suggest that she lives in a [place].   Summary of pulmonary history as outlined by Marland Kitchen, NP, "Initially hospitalized in October 2022 with multifocal pneumonia; first stay complicated by empyema on the right requiring CT. Re-admission from 03/03/2021-03/09/2021.  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 Treated with unasyn and 3 weeks of augumentin. She presents today with worsening productive cough. CT chest from yesterday, 10/26 with increasing RML cavitary mass when compared to previous; awaiting final read. She will need extended abx course with augmentin x 4 weeks. Sputum culture collected today; although, previous have been no growth. She will be set up for outpatient bronchoscopy for further evaluation. Referral placed to ID. Discussed case with Dr. Loanne Drilling. Advised on mucociliary clearance therapies."  01/26/22 Sputum culture: + Serratia marcescens, so her antibiotic was changed from Augmentin to Bactrim DS 1 tab BID for at least 4 weeks.  Summary of GI/Hepatitis B history as outlined by Scherrie Gerlach, NP on 01/06/22, "Last Hep B DNA in march 2022 was undectable though Hep B Surface Ag was remained reactive, she continues on entecavir 0.52m daily, was to have updated Hep B serologies in April 2023 but was lost to follow up for this. Will update these serologies today. Last LFTs in July with mildly  elevated Alk PHos of 137, though this was during acute pneumonia, previously LFTs all WNL earlier that month. Notably last liver specific imaging with [facility] in 2021 with no focal abnormalities, though liver biopsy in 2016 with F2/F3 fibrosis, will proceed with [facility] elastography to evaluate for potential progression of fibrosis to cirrhosis." Orders then includes Hep B surface Ag, Hep B DNA, [facility] electrography, and also recommended future screening colonoscopy or Cologuard. Six month follow-up planned.    She is a same day work-up. She had labs on 01/14/22 and 01/26/22. Additional labs as indicated on arrival. She has an IUD. Anesthesia team to evaluate on the day of surgery.    VS:  BP Readings from Last 3 Encounters:  01/26/22 102/64  01/14/22 115/74  01/06/22 114/79   Pulse Readings from Last 3 Encounters:  01/26/22 94  01/14/22 (!) 120  01/06/22 98     PROVIDERS: Dettinger, JFransisca Kaufmann MD is PCP  EMargaretha Seeds MD is pulmonologist RHildred Laser MD is GI CScharlene Gloss MD is ID (upcoming new patient appointment on 02/07/22)  KDerek Jack MD is HEM-ONC. Last seen on 05/04/21 for follow-up pulmonary nodules. Malignancy felt unlikely at that point, so ongoing follow-up with pulmonology recommended.    [facility]: [facility] Results  Component Value Date   WBC 14.6 (H) 01/26/2022   HGB 11.7 01/26/2022   HCT 35.6 01/26/2022   PLT 353 01/26/2022   GLUCOSE 194 (H) 01/26/2022   ALT 17 01/14/2022   AST 18 01/14/2022   NA 139 01/26/2022   K 4.2 01/26/2022   CL  96 01/26/2022   CREATININE 0.67 01/26/2022   BUN 9 01/26/2022   CO2 25 01/26/2022     IMAGES: US Liver/Elastography 01/27/22: IMPRESSION: ULTRASOUND LIVER: No hepatic sonographic abnormalities.  ULTRASOUND HEPATIC ELASTOGRAPHY: Median kPa:  3.3 Diagnostic category:  < or = 5 kPa: high probability of being normal  CT Chest 01/25/22: IMPRESSION: 1. Multifocal pneumonia which  is much more extensive than 06/11/2021, presumably acute on chronic infection. 2. The chronic opacities are associated with mucoid impaction, airway thickening, and cylindrical bronchiectasis. Chronic atypical infection such as with mycobacterium is a strong consideration. Additionally, there was a notable basal predilection on prior and chronic aspiration is also possible. Two areas of cavitation or seen in the posterior aspect of the right upper lobe.    EKG: 10/07/21: NSR   CV: N/A  Past Medical History:  Diagnosis Date   Hematuria 11/12/2014   Hepatitis B carrier ([place])    Hypothyroidism    Impulse disorder    IUD (intrauterine device) in place 11/12/2014   [facility]12 [facility]   Mild intellectual disability    Schizophrenia ([place])    Tuberculosis    as a child   Urinary frequency 11/12/2014    Past Surgical History:  Procedure Laterality Date   ARM WOUND REPAIR / CLOSURE     TRACHEAL SURGERY     TRACHEOSTOMY      MEDICATIONS: No current facility-administered medications for this encounter.    acetaminophen (TYLENOL) 500 MG tablet   albuterol (PROVENTIL) (2.5 MG/3ML) 0.083% nebulizer solution   Amantadine HCl 100 MG tablet   Bacillus Coagulans-Inulin (PROBIOTIC) 1-250 BILLION-MG CAPS   Cholecalciferol (VITAMIN D3) 50 MCG (2000 UT) capsule   clozapine (CLOZARIL) 200 MG tablet   entecavir (BARACLUDE) 0.5 MG tablet   feeding supplement (BOOST HIGH PROTEIN) LIQD   Fish Oil-Cholecalciferol (FISH OIL-VITAMIN D) 1200-1000 MG-UNIT CAPS   FLUoxetine (PROZAC) 20 MG capsule   fluticasone (FLONASE) 50 MCG/ACT nasal spray   fluticasone-salmeterol (ADVAIR HFA) 115-21 MCG/ACT inhaler   Gauze Pads & Dressings (STERILE GAUZE) 3"X3" PADS   guaiFENesin (MUCINEX) 600 MG 12 hr tablet   levonorgestrel (MIRENA) 20 MCG/24HR IUD   loratadine (CLARITIN) 10 MG tablet   mirtazapine (REMERON) 15 MG tablet   polyethylene glycol powder (GOODSENSE CLEARLAX) 17 GM/SCOOP powder   Skin  Protectants, Misc. (MINERIN CREME) CREA   sulfamethoxazole-trimethoprim (BACTRIM DS) 800-160 MG tablet   Tiotropium Bromide Monohydrate (SPIRIVA RESPIMAT) 1.25 MCG/ACT AERS   vitamin E 200 UNIT capsule    Myra Gianotti, PA-C Surgical Short Stay/Anesthesiology [facility] Phone [phone] [facility] Phone [phone] 02/01/2022 11:21 AM

## 2022-02-01 NOTE — Progress Notes (Signed)
Surgical Instructions for Margaret [name] Day of Surgery - Wednesday, 02/02/22    Your procedure is scheduled on Wed., 02/02/22.  Report to [facility] Main Entrance "A" at 6 A.M., then check in with the Admitting office.  Call this number if you have problems the morning of surgery:  [phone]   If you have any questions prior to your surgery date call [phone]: Open Monday-Friday 8am-4pm If you experience any cold or flu symptoms such as cough, fever, chills, shortness of breath, etc. between now and your scheduled surgery, please notify us at the above number     Remember:  Do not eat or drink after midnight tonight.      Take these medicines the morning of surgery with A SIP OF WATER:  Albuterol inhaler if needed Spiriva Inhaler if needed Advair Inhaler  Amantadiine Entecavir Prozac Flonase Nasal Spray Mucinex Claritin Bactrim  As of today, STOP taking any Aspirin (unless otherwise instructed by your surgeon) Aleve, Naproxen, Ibuprofen, Motrin, Advil, Goody's, BC's, all herbal medications, fish oil, and all vitamins.           Do not wear jewelry or makeup. Do not wear lotions, powders, perfumes or deodorant. Do not shave 48 hours prior to surgery.   Do not bring valuables to the hospital. Do not wear nail polish, gel polish, artificial nails, or any other type of covering on natural nails (fingers and toes) If you have artificial nails or gel coating that need to be removed by a nail salon, please have this removed prior to surgery. Artificial nails or gel coating may interfere with anesthesia's ability to adequately monitor your vital signs.  [facility] is not responsible for any belongings or valuables.     Contacts, glasses, hearing aids, dentures or partials may not be worn into surgery, please bring cases for these belongings   Patients discharged the day of surgery will not be allowed to drive home, and someone needs to stay with them for 24 hours.  SURGICAL  WAITING ROOM VISITATION Patients having surgery or a procedure may have no more than 2 support people in the waiting area - these visitors may rotate.   Children under the age of 47 must have an adult with them who is not the patient. If the patient needs to stay at the hospital during part of their recovery, the visitor guidelines for inpatient rooms apply. Pre-op nurse will coordinate an appropriate time for 1 support person to accompany patient in pre-op.  This support person may not rotate.   Please refer to [email or web address] for the visitor guidelines for Inpatients (after your surgery is over and you are in a regular room).    Special instructions:    Oral Hygiene is also important to reduce your risk of infection.  Remember - BRUSH YOUR TEETH THE MORNING OF SURGERY WITH YOUR REGULAR TOOTHPASTE

## 2022-02-02 ENCOUNTER — Ambulatory Visit (HOSPITAL_COMMUNITY)
Admission: RE | Admit: 2022-02-02 | Discharge: 2022-02-02 | Disposition: A | Discharge status: Home / Self Care | Payer: Medicare Other | Attending: Internal Medicine | Admitting: Internal Medicine

## 2022-02-02 ENCOUNTER — Ambulatory Visit (HOSPITAL_COMMUNITY): Payer: Medicare Other | Admitting: Vascular Surgery

## 2022-02-02 ENCOUNTER — Encounter (HOSPITAL_COMMUNITY): Payer: Self-pay | Admitting: Internal Medicine

## 2022-02-02 ENCOUNTER — Other Ambulatory Visit: Payer: Self-pay

## 2022-02-02 ENCOUNTER — Ambulatory Visit: Payer: Medicare Other | Admitting: Internal Medicine

## 2022-02-02 ENCOUNTER — Encounter (HOSPITAL_COMMUNITY)
Admission: RE | Disposition: A | Discharge status: Home / Self Care | Payer: Self-pay | Source: Home / Self Care | Attending: Internal Medicine

## 2022-02-02 ENCOUNTER — Ambulatory Visit (HOSPITAL_BASED_OUTPATIENT_CLINIC_OR_DEPARTMENT_OTHER): Payer: Medicare Other | Admitting: Vascular Surgery

## 2022-02-02 ENCOUNTER — Ambulatory Visit (HOSPITAL_COMMUNITY): Payer: Medicare Other

## 2022-02-02 DIAGNOSIS — F419 Anxiety disorder, unspecified: Secondary | ICD-10-CM | POA: Insufficient documentation

## 2022-02-02 DIAGNOSIS — F639 Impulse disorder, unspecified: Secondary | ICD-10-CM | POA: Insufficient documentation

## 2022-02-02 DIAGNOSIS — J449 Chronic obstructive pulmonary disease, unspecified: Secondary | ICD-10-CM | POA: Insufficient documentation

## 2022-02-02 DIAGNOSIS — F209 Schizophrenia, unspecified: Secondary | ICD-10-CM

## 2022-02-02 DIAGNOSIS — Z1152 Encounter for screening for COVID-19: Secondary | ICD-10-CM | POA: Diagnosis not present

## 2022-02-02 DIAGNOSIS — J189 Pneumonia, unspecified organism: Secondary | ICD-10-CM | POA: Diagnosis present

## 2022-02-02 DIAGNOSIS — F70 Mild intellectual disabilities: Secondary | ICD-10-CM | POA: Diagnosis not present

## 2022-02-02 DIAGNOSIS — F418 Other specified anxiety disorders: Secondary | ICD-10-CM | POA: Diagnosis not present

## 2022-02-02 DIAGNOSIS — E039 Hypothyroidism, unspecified: Secondary | ICD-10-CM

## 2022-02-02 DIAGNOSIS — B9689 Other specified bacterial agents as the cause of diseases classified elsewhere: Secondary | ICD-10-CM | POA: Insufficient documentation

## 2022-02-02 DIAGNOSIS — B1910 Unspecified viral hepatitis B without hepatic coma: Secondary | ICD-10-CM | POA: Insufficient documentation

## 2022-02-02 DIAGNOSIS — F32A Depression, unspecified: Secondary | ICD-10-CM | POA: Diagnosis not present

## 2022-02-02 DIAGNOSIS — R918 Other nonspecific abnormal finding of lung field: Secondary | ICD-10-CM | POA: Diagnosis not present

## 2022-02-02 HISTORY — DX: Inflammatory liver disease, unspecified: K75.9

## 2022-02-02 HISTORY — DX: Anxiety disorder, unspecified: F41.9

## 2022-02-02 HISTORY — PX: BRONCHIAL BRUSHINGS: SHX5108

## 2022-02-02 HISTORY — PX: VIDEO BRONCHOSCOPY: SHX5072

## 2022-02-02 HISTORY — DX: Hepatic fibrosis, unspecified: K74.00

## 2022-02-02 HISTORY — DX: Depression, unspecified: F32.A

## 2022-02-02 HISTORY — PX: BRONCHIAL WASHINGS: SHX5105

## 2022-02-02 LAB — BODY FLUID CELL COUNT WITH DIFFERENTIAL
Eos, Fluid: 0 %
Lymphs, Fluid: 2 %
Monocyte-Macrophage-Serous Fluid: 16 % — ABNORMAL LOW (ref 50–90)
Neutrophil Count, Fluid: 84 % — ABNORMAL HIGH (ref 0–25)
Total Nucleated Cell Count, Fluid: 850 cu mm (ref 0–1000)

## 2022-02-02 LAB — SARS CORONAVIRUS 2 BY RT PCR: SARS Coronavirus 2 by RT PCR: NEGATIVE

## 2022-02-02 SURGERY — BRONCHOSCOPY, WITH FLUOROSCOPY
Anesthesia: General

## 2022-02-02 MED ORDER — LACTATED RINGERS IV SOLN: INTRAVENOUS | Status: DC

## 2022-02-02 MED ORDER — KETOROLAC TROMETHAMINE 30 MG/ML IJ SOLN: 15.0000 mg | Freq: Once | INTRAMUSCULAR | Status: DC

## 2022-02-02 MED ORDER — MIDAZOLAM HCL 2 MG/2ML IJ SOLN
INTRAMUSCULAR | Status: DC | PRN
  Administered 2022-02-02: 2 mg via INTRAVENOUS

## 2022-02-02 MED ORDER — SUGAMMADEX SODIUM 200 MG/2ML IV SOLN
INTRAVENOUS | Status: DC | PRN
  Administered 2022-02-02: 150 mg via INTRAVENOUS

## 2022-02-02 MED ORDER — OXYCODONE HCL 5 MG PO TABS: 5.0000 mg | ORAL_TABLET | Freq: Once | ORAL | Status: DC | PRN

## 2022-02-02 MED ORDER — PROMETHAZINE HCL 25 MG/ML IJ SOLN: 6.2500 mg | INTRAMUSCULAR | Status: DC | PRN

## 2022-02-02 MED ORDER — AMISULPRIDE (ANTIEMETIC) 5 MG/2ML IV SOLN: 10.0000 mg | Freq: Once | INTRAVENOUS | Status: DC | PRN

## 2022-02-02 MED ORDER — PROPOFOL 10 MG/ML IV BOLUS
INTRAVENOUS | Status: DC | PRN
  Administered 2022-02-02: 100 mg via INTRAVENOUS

## 2022-02-02 MED ORDER — OXYCODONE HCL 5 MG/5ML PO SOLN: 5.0000 mg | Freq: Once | ORAL | Status: DC | PRN

## 2022-02-02 MED ORDER — CHLORHEXIDINE GLUCONATE 0.12 % MT SOLN
15.0000 mL | Freq: Once | OROMUCOSAL | Status: AC
  Filled 2022-02-02: qty 15

## 2022-02-02 MED ORDER — CHLORHEXIDINE GLUCONATE 0.12 % MT SOLN
OROMUCOSAL | Status: AC
  Administered 2022-02-02: 15 mL via OROMUCOSAL
  Filled 2022-02-02: qty 15

## 2022-02-02 MED ORDER — ROCURONIUM BROMIDE 10 MG/ML (PF) SYRINGE
PREFILLED_SYRINGE | INTRAVENOUS | Status: DC | PRN
  Administered 2022-02-02: 40 mg via INTRAVENOUS

## 2022-02-02 MED ORDER — FENTANYL CITRATE (PF) 250 MCG/5ML IJ SOLN
INTRAMUSCULAR | Status: DC | PRN
  Administered 2022-02-02 (×2): 50 ug via INTRAVENOUS

## 2022-02-02 MED ORDER — DEXAMETHASONE SODIUM PHOSPHATE 10 MG/ML IJ SOLN
INTRAMUSCULAR | Status: DC | PRN
  Administered 2022-02-02: 4 mg via INTRAVENOUS

## 2022-02-02 MED ORDER — LIDOCAINE 2% (20 MG/ML) 5 ML SYRINGE
INTRAMUSCULAR | Status: DC | PRN
  Administered 2022-02-02: 60 mg via INTRAVENOUS

## 2022-02-02 MED ORDER — ACETAMINOPHEN 10 MG/ML IV SOLN: 750.0000 mg | Freq: Once | INTRAVENOUS | Status: DC | PRN

## 2022-02-02 MED ORDER — FENTANYL CITRATE (PF) 100 MCG/2ML IJ SOLN: 25.0000 ug | INTRAMUSCULAR | Status: DC | PRN

## 2022-02-02 NOTE — Op Note (Signed)
Bronchoscopy Procedure Note  Margerite Impastato  [id number]  07-Aug-1970  Date:02/02/22  Time:9:46 AM   Provider Performing:Chieko Neises B Jalen Daluz   Procedure(s):  Flexible Bronchoscopy [phone]), Flexible bronchoscopy with brushing [phone]), and Flexible bronchoscopy with bronchial alveolar lavage ([id number])  Indication(s) Pneumonia  Consent Risks of the procedure as well as the alternatives and risks of each were explained to the patient and/or caregiver.  Consent for the procedure was obtained and is signed in the bedside chart  Anesthesia General   Time Out Verified patient identification, verified procedure, site/side was marked, verified correct patient position, special equipment/implants available, medications/allergies/relevant history reviewed, required imaging and test results available.   Sterile Technique Usual hand hygiene, masks, gowns, and gloves were used   Procedure Description Bronchoscope advanced through endotracheal tube and into airway.  Airways were examined down to subsegmental level with findings noted below.   Following diagnostic evaluation, BAL(s) performed in RML with normal saline and return of 79m fluid and Brushing(s) performed in RML and left bronchial wash performed.  Findings:  - Thick mucous secretions noted throghout bilateral bronchial trees - Once secretions suctioned to clarity, normal appearing airways   Complications/Tolerance None; patient tolerated the procedure well. Chest X-ray is needed post procedure.   EBL Minimal   Specimen(s) - RML BAL for cultures and cytology - RML Brush for cultures - Left Bronchial Washings for culture

## 2022-02-02 NOTE — Progress Notes (Signed)
Cassandra massenburg called and consent obtained via telephone with two RN's ([place] and [facility])

## 2022-02-02 NOTE — Transfer of Care (Signed)
Immediate Anesthesia Transfer of Care Note  Patient: Shaianne Nucci  Procedure(s) Performed: VIDEO BRONCHOSCOPY WITH FLUORO BRONCHIAL BRUSHINGS BRONCHIAL WASHINGS  Patient Location: PACU  Anesthesia Type:General  Level of Consciousness: awake, alert , and oriented  Airway & Oxygen Therapy: Patient Spontanous Breathing and Patient connected to nasal cannula oxygen  Post-op Assessment: Report given to RN and Post -op Vital signs reviewed and stable  Post vital signs: Reviewed and stable  Last Vitals:  Vitals Value Taken Time  BP 135/107 02/02/22 0957  Temp    Pulse 95 02/02/22 0959  Resp 18 02/02/22 0959  SpO2 96 % 02/02/22 0959  Vitals shown include unvalidated device data.  Last Pain:  Vitals:   02/02/22 0725  TempSrc:   PainSc: 0-No pain         Complications: No notable events documented.

## 2022-02-02 NOTE — Interval H&P Note (Signed)
History and Physical Interval Note:  02/02/2022 9:00 AM  Margaret [name]  has presented today for surgery, with the diagnosis of CT changes.  The various methods of treatment have been discussed with the patient and family. After consideration of risks, benefits and other options for treatment, the patient has consented to  Procedure(s): VIDEO BRONCHOSCOPY WITH FLUORO (N/A) as a surgical intervention.  The patient's history has been reviewed, patient examined, no change in status, stable for surgery.  I have reviewed the patient's chart and labs.  Questions were answered to the patient's satisfaction.     Freddi Starr

## 2022-02-02 NOTE — Anesthesia Postprocedure Evaluation (Signed)
Anesthesia Post Note  Patient: Margaret [name]  Procedure(s) Performed: VIDEO BRONCHOSCOPY WITH FLUORO BRONCHIAL BRUSHINGS BRONCHIAL WASHINGS     Patient location during evaluation: PACU Anesthesia Type: General Level of consciousness: awake Pain management: pain level controlled Vital Signs Assessment: post-procedure vital signs reviewed and stable Respiratory status: spontaneous breathing, nonlabored ventilation, respiratory function stable and patient connected to nasal cannula oxygen Cardiovascular status: blood pressure returned to baseline and stable Postop Assessment: no apparent nausea or vomiting Anesthetic complications: no   No notable events documented.  Last Vitals:  Vitals:   02/02/22 1015 02/02/22 1030  BP: (!) 129/112 98/65  Pulse: 98 94  Resp: 16 17  Temp:  36.4 C  SpO2: 94% 98%    Last Pain:  Vitals:   02/02/22 1030  TempSrc:   PainSc: 0-No pain                 Shakir Petrosino P Adeleigh Barletta

## 2022-02-02 NOTE — Anesthesia Procedure Notes (Addendum)
Procedure Name: Intubation Date/Time: 02/02/2022 9:15 AM  Performed by: Anastasio Auerbach, CRNAPre-anesthesia Checklist: Patient identified, Emergency Drugs available, Suction available and Patient being monitored Patient Re-evaluated:Patient Re-evaluated prior to induction Oxygen Delivery Method: Circle system utilized Preoxygenation: Pre-oxygenation with 100% oxygen Induction Type: IV induction Ventilation: Mask ventilation without difficulty Laryngoscope Size: Mac and 3 Grade View: Grade I Tube type: Oral Tube size: 8.0 mm Number of attempts: 1 Airway Equipment and Method: Stylet and Oral airway Placement Confirmation: ETT inserted through vocal cords under direct vision, positive ETCO2 and breath sounds checked- equal and bilateral Secured at: 22 cm Tube secured with: Tape Dental Injury: Teeth and Oropharynx as per pre-operative assessment

## 2022-02-03 LAB — HEPATITIS B SURFACE ANTIGEN

## 2022-02-04 LAB — ACID FAST SMEAR (AFB, MYCOBACTERIA)
Acid Fast Smear: NEGATIVE
Acid Fast Smear: NEGATIVE
Acid Fast Smear: NEGATIVE

## 2022-02-04 LAB — CULTURE, RESPIRATORY W GRAM STAIN

## 2022-02-04 LAB — CYTOLOGY - NON PAP

## 2022-02-05 LAB — CULTURE, RESPIRATORY W GRAM STAIN

## 2022-02-05 LAB — CARBAPENEM RESISTANCE PANEL
Carba Resistance IMP Gene: NOT DETECTED
Carba Resistance KPC Gene: NOT DETECTED
Carba Resistance NDM Gene: NOT DETECTED
Carba Resistance OXA48 Gene: NOT DETECTED
Carba Resistance VIM Gene: NOT DETECTED

## 2022-02-06 ENCOUNTER — Encounter (HOSPITAL_COMMUNITY): Payer: Self-pay | Admitting: Pulmonary Disease

## 2022-02-07 ENCOUNTER — Encounter: Payer: Self-pay | Admitting: Internal Medicine

## 2022-02-07 ENCOUNTER — Ambulatory Visit (INDEPENDENT_AMBULATORY_CARE_PROVIDER_SITE_OTHER): Payer: Medicare Other | Admitting: Internal Medicine

## 2022-02-07 ENCOUNTER — Other Ambulatory Visit: Payer: Self-pay

## 2022-02-07 VITALS — BP 108/67 | HR 99 | Temp 97.8°F | Ht 64.0 in | Wt 109.0 lb

## 2022-02-07 DIAGNOSIS — J189 Pneumonia, unspecified organism: Secondary | ICD-10-CM

## 2022-02-07 DIAGNOSIS — J984 Other disorders of lung: Secondary | ICD-10-CM

## 2022-02-07 LAB — BASIC METABOLIC PANEL
BUN: 19 mg/dL (ref 7–25)
CO2: 30 mmol/L (ref 20–32)
Calcium: 9 mg/dL (ref 8.6–10.2)
Chloride: 101 mmol/L (ref 98–110)
Creat: 0.79 mg/dL (ref 0.50–0.99)
Glucose, Bld: 65 mg/dL (ref 65–99)
Potassium: 4.3 mmol/L (ref 3.5–5.3)
Sodium: 137 mmol/L (ref 135–146)

## 2022-02-07 LAB — AEROBIC/ANAEROBIC CULTURE W GRAM STAIN (SURGICAL/DEEP WOUND)
Culture: NO GROWTH
Gram Stain: NONE SEEN

## 2022-02-07 LAB — POCT PREGNANCY, URINE: Preg Test, Ur: NEGATIVE

## 2022-02-07 NOTE — Patient Instructions (Addendum)
Please continue with the antibiotic for treatment of your pneumonia with Serratia marcescens

## 2022-02-07 NOTE — Progress Notes (Signed)
[place] [facility]      Reason for Consult: cavitary pneumonia    Referring Physician: Dr. Loanne Drilling    [name], female    DOB: [birth year], 51 y.o.   MRN: [id number]  HPI:   She is here for evaluation of a cavitary pneumonia She is followed by pulmonary with a history of emphysema with no smoking history, history of childhood tuberculosis and on pulmonary treatments and had been hospitalized this year with pneumonia, empyema and required a chest tube.  She continued to have a ocugh and CT scan notable for cavitary pneumonia.  No fever or chills.  Sputum culture positive for Serratia marcescens and underwent BAL which also grew Serratia.  She was changed from empiric augmentin to TMP/SMX and tolerating well.  Here with her representative guardian and from the facility.  She is in a group home.    Past Medical History:  Diagnosis Date   Anxiety    Depression    Hematuria 11/12/2014   Hepatitis    Hepatitis B carrier ([employer])    Hypothyroidism    Impulse disorder    IUD (intrauterine device) in place 11/12/2014   Inserted 12/24/12 at [facility]   Liver fibrosis    Mild intellectual disability    Pneumonia 01/2022   Pulmonary nodules 01/2022   Schizophrenia ([place])    Tuberculosis    as a child   Urinary frequency 11/12/2014    Prior to Admission medications   Medication Sig Start Date End Date Taking? Authorizing Provider  acetaminophen (TYLENOL) 500 MG tablet Take 1 tablet (500 mg total) by mouth every 6 (six) hours as needed for moderate pain, fever or headache. 01/14/22   Dettinger, Fransisca Kaufmann, MD  albuterol (PROVENTIL) (2.5 MG/3ML) 0.083% nebulizer solution Take 3 mLs (2.5 mg total) by nebulization every 6 (six) hours as needed for wheezing or shortness of breath. 01/26/22   Cobb, Karie Schwalbe, NP  Amantadine HCl 100 MG tablet Take 100 mg by mouth every morning. 02/22/21   [provider]  Bacillus Coagulans-Inulin (PROBIOTIC) 1-250  BILLION-MG CAPS Take 1 capsule by mouth daily for 14 days. 01/26/22 02/09/22  Cobb, Karie Schwalbe, NP  Cholecalciferol (VITAMIN D3) 50 MCG (2000 UT) capsule Take 1 capsule (2,000 Units total) by mouth daily. 01/14/22   Dettinger, Fransisca Kaufmann, MD  clozapine (CLOZARIL) 200 MG tablet TAKE 2 TABLETS BY MOUTH AT BEDTIME. 11/19/21   Dettinger, Fransisca Kaufmann, MD  entecavir (BARACLUDE) 0.5 MG tablet TAKE (1) TABLET BY MOUTH ONCE DAILY. Patient taking differently: Take 0.5 mg by mouth daily. 09/02/21   Rogene Houston, MD  feeding supplement (BOOST HIGH PROTEIN) LIQD Take 1 Container by mouth 3 (three) times daily between meals. 237 mls Tid between meals.    [provider]  Fish Oil-Cholecalciferol (FISH OIL-VITAMIN D) 1200-1000 MG-UNIT CAPS TAKE 1 CAPSULE BY MOUTH TWICE DAILY. 09/28/21   Dettinger, Fransisca Kaufmann, MD  FLUoxetine (PROZAC) 20 MG capsule TAKE 3 CAPSULES BY MOUTH ONCE DAILY. 12/29/21   Dettinger, Fransisca Kaufmann, MD  fluticasone (FLONASE) 50 MCG/ACT nasal spray SPRAY 2 SPRAYS IN RIGHT NOSTRIL ONLY TWICE DAILY. 11/15/21   Dettinger, Fransisca Kaufmann, MD  fluticasone-salmeterol (ADVAIR HFA) 027-74 MCG/ACT inhaler Inhale 2 puffs into the lungs 2 (two) times daily. 12/28/21   Margaretha Seeds, MD  Gauze Pads & Dressings (STERILE GAUZE) [phone]" PADS 2 packets by Does not apply route daily. 04/22/21   Margaretha Seeds, MD  guaiFENesin ([place]) 600 MG 12  hr tablet Take 2 tablets (1,200 mg total) by mouth 2 (two) times daily. 01/26/22   Cobb, Karie Schwalbe, NP  levonorgestrel (MIRENA) 20 MCG/24HR IUD 1 each by Intrauterine route once.    [provider]  loratadine (CLARITIN) 10 MG tablet Take 1 tablet (10 mg total) by mouth daily. 06/22/21   Margaretha Seeds, MD  mirtazapine (REMERON) 15 MG tablet Take 15 mg by mouth at bedtime. 02/22/21   [provider]  polyethylene glycol powder (GOODSENSE CLEARLAX) 17 GM/SCOOP powder Take 127.5 g by mouth 2 (two) times daily. Patient taking differently: Take 1 Container by  mouth 2 (two) times daily. 17 grams Bid 10/09/21   Murlean Iba, MD  Skin Protectants, Misc. (MINERIN CREME) CREA Apply 1 Application topically See admin instructions. Apply moderate amount topically to dry skin on soles and heels of both feet after shower 10/09/21   Johnson, Clanford L, MD  sulfamethoxazole-trimethoprim (BACTRIM DS) 800-160 MG tablet Take 1 tablet by mouth 2 (two) times daily for 28 days. 01/31/22 02/28/22  Cobb, Karie Schwalbe, NP  Tiotropium Bromide Monohydrate (SPIRIVA RESPIMAT) 1.25 MCG/ACT AERS Inhale 2 puffs into the lungs daily. 06/22/21   Margaretha Seeds, MD  vitamin E 200 UNIT capsule Take 1 capsule (200 Units total) by mouth daily. 07/14/21   Dettinger, Fransisca Kaufmann, MD    Allergies  Allergen Reactions   Lactose Nausea And Vomiting    GI upset   Olanzapine     Other reaction(s): UNKNOWN   Tuberculin, Ppd     Other reaction(s): UNKNOWN   Milk ([place]) Nausea And Vomiting    Other reaction(s): GI upset    Social History   Tobacco Use   Smoking status: Never    Passive exposure: Never   Smokeless tobacco: Never  Vaping Use   Vaping Use: Never used  Substance Use Topics   Alcohol use: No    Alcohol/week: 0.0 standard drinks of alcohol   Drug use: No    Family History  Adopted: Yes  Family history unknown: Yes    Review of Systems  Constitutional: negative for fevers and chills All other systems reviewed and are negative    Respiratory: normal respiratory effort Musculoskeletal: no edema Skin: no rash  Labs: Lab Results  Component Value Date   WBC 14.6 (H) 01/26/2022   HGB 11.7 01/26/2022   HCT 35.6 01/26/2022   MCV 84 01/26/2022   PLT 353 01/26/2022    Lab Results  Component Value Date   CREATININE 0.67 01/26/2022   BUN 9 01/26/2022   NA 139 01/26/2022   K 4.2 01/26/2022   CL 96 01/26/2022   CO2 25 01/26/2022    Lab Results  Component Value Date   ALT 17 01/14/2022   AST 18 01/14/2022   ALKPHOS 177 (H) 01/14/2022   BILITOT <0.2  01/14/2022   INR 1.2 03/03/2021     Assessment: she has cavitary pneumonia with Serratia marcescens and on appropriate treatment.  Will plan for about 4 weeks of TMP/SMX with a follow up cxr in about 2-3 weeks.    Plan: 1)  continue TMP/SMX 2) bmp today Follow up in 2-3 weeks

## 2022-02-08 ENCOUNTER — Other Ambulatory Visit (INDEPENDENT_AMBULATORY_CARE_PROVIDER_SITE_OTHER): Payer: Self-pay

## 2022-02-08 DIAGNOSIS — B181 Chronic viral hepatitis B without delta-agent: Secondary | ICD-10-CM

## 2022-02-09 ENCOUNTER — Other Ambulatory Visit: Payer: Medicaid Other

## 2022-02-09 DIAGNOSIS — Z79899 Other long term (current) drug therapy: Secondary | ICD-10-CM | POA: Diagnosis not present

## 2022-02-12 LAB — HEPATITIS B SURFACE AG, CONFIRM: HBsAG Confirmation: POSITIVE — AB

## 2022-02-12 LAB — HEPATITIS B SURFACE ANTIGEN

## 2022-02-15 ENCOUNTER — Other Ambulatory Visit (INDEPENDENT_AMBULATORY_CARE_PROVIDER_SITE_OTHER): Payer: Self-pay | Admitting: Internal Medicine

## 2022-02-15 DIAGNOSIS — B1910 Unspecified viral hepatitis B without hepatic coma: Secondary | ICD-10-CM

## 2022-02-22 ENCOUNTER — Ambulatory Visit (INDEPENDENT_AMBULATORY_CARE_PROVIDER_SITE_OTHER): Payer: Medicare Other | Admitting: Internal Medicine

## 2022-02-22 ENCOUNTER — Encounter: Payer: Self-pay | Admitting: Internal Medicine

## 2022-02-22 ENCOUNTER — Other Ambulatory Visit: Payer: Self-pay

## 2022-02-22 ENCOUNTER — Ambulatory Visit
Admission: RE | Admit: 2022-02-22 | Discharge: 2022-02-22 | Disposition: A | Discharge status: Home / Self Care | Payer: Medicare Other | Source: Ambulatory Visit | Attending: Internal Medicine | Admitting: Internal Medicine

## 2022-02-22 VITALS — BP 100/73 | HR 93 | Temp 97.4°F | Resp 16 | Ht 64.0 in | Wt 108.0 lb

## 2022-02-22 DIAGNOSIS — J984 Other disorders of lung: Secondary | ICD-10-CM | POA: Diagnosis not present

## 2022-02-22 DIAGNOSIS — B181 Chronic viral hepatitis B without delta-agent: Secondary | ICD-10-CM | POA: Diagnosis not present

## 2022-02-22 DIAGNOSIS — J189 Pneumonia, unspecified organism: Secondary | ICD-10-CM | POA: Diagnosis not present

## 2022-02-22 DIAGNOSIS — Z1211 Encounter for screening for malignant neoplasm of colon: Secondary | ICD-10-CM | POA: Insufficient documentation

## 2022-02-22 DIAGNOSIS — R918 Other nonspecific abnormal finding of lung field: Secondary | ICD-10-CM | POA: Diagnosis not present

## 2022-02-22 DIAGNOSIS — Z5181 Encounter for therapeutic drug level monitoring: Secondary | ICD-10-CM

## 2022-02-22 NOTE — Assessment & Plan Note (Signed)
Will continue bactrim another week and check a cxr today.  If there are perstent findings, will consider extending her Bactrim by 2 weeks.  Will go ahead and arrange follow up in about 3 weeks but can cancel if everything looks resolved.

## 2022-02-22 NOTE — Assessment & Plan Note (Signed)
She continues on Entecavir and followed by Dr. Laural Golden

## 2022-02-22 NOTE — Assessment & Plan Note (Signed)
Will check her BMP today

## 2022-02-22 NOTE — Progress Notes (Signed)
   Subjective:    [name], female    DOB: Aug 11, 1970, 51 y.o.   MRN: [id number]  HPI Margaret [name] is here for follow up of cavitary pneumonia She has a history of non-tobacco related emphysema and recently with a cavitary pneumonia with Serratia in culture from BAL.  She has been on Bactrim and improving. Still with a persistent cough.     Review of Systems  Constitutional:  Negative for fatigue and fever.  Respiratory:  Positive for cough. Negative for shortness of breath and wheezing.   Gastrointestinal:  Negative for diarrhea.       Objective:   Physical Exam Eyes:     General: No scleral icterus. Pulmonary:     Effort: Pulmonary effort is normal.  Skin:    Findings: No rash.  Neurological:     General: No focal deficit present.     Mental Status: She is alert.   SH: no tobacco        Assessment & Plan:

## 2022-02-23 LAB — BASIC METABOLIC PANEL
BUN: 16 mg/dL (ref 7–25)
CO2: 32 mmol/L (ref 20–32)
Calcium: 8.9 mg/dL (ref 8.6–10.2)
Chloride: 105 mmol/L (ref 98–110)
Creat: 0.67 mg/dL (ref 0.50–0.99)
Glucose, Bld: 106 mg/dL — ABNORMAL HIGH (ref 65–99)
Potassium: 4 mmol/L (ref 3.5–5.3)
Sodium: 142 mmol/L (ref 135–146)

## 2022-03-02 ENCOUNTER — Telehealth: Payer: Self-pay

## 2022-03-02 NOTE — Telephone Encounter (Signed)
I attempted to call patient guardian listed on the chart Margaret [name] to give results and treatment plan. I left her a voicemail to call our office back.  Margaret [name] Margaret [name]

## 2022-03-02 NOTE — Telephone Encounter (Signed)
-----   Message from Thayer Headings, MD sent at 03/02/2022  2:55 PM EST ----- Please have her continue with Bactrim and extra 2 weeks.  Her CXR was nearly resolved but not quite.  thanks

## 2022-03-03 NOTE — Telephone Encounter (Signed)
Staff was able to conncet with patient to relay CXR results and Dr. Henreitta Leber recommendation to continue Bactrim for an additional two weeks. Patient verbalized understanding. Staff also reminded patient of upcoming appointment on 12/20.  Margaret [name]

## 2022-03-04 LAB — FUNGUS CULTURE WITH STAIN

## 2022-03-04 LAB — FUNGAL ORGANISM REFLEX

## 2022-03-04 LAB — FUNGUS CULTURE RESULT

## 2022-03-15 ENCOUNTER — Other Ambulatory Visit: Payer: Self-pay | Admitting: Family Medicine

## 2022-03-18 LAB — ACID FAST CULTURE WITH REFLEXED SENSITIVITIES (MYCOBACTERIA)
Acid Fast Culture: NEGATIVE
Acid Fast Culture: NEGATIVE

## 2022-03-23 ENCOUNTER — Ambulatory Visit (INDEPENDENT_AMBULATORY_CARE_PROVIDER_SITE_OTHER): Payer: Medicare Other | Admitting: Internal Medicine

## 2022-03-23 ENCOUNTER — Encounter: Payer: Self-pay | Admitting: Internal Medicine

## 2022-03-23 ENCOUNTER — Other Ambulatory Visit: Payer: Self-pay

## 2022-03-23 VITALS — BP 98/66 | HR 94 | Temp 98.1°F | Wt 106.0 lb

## 2022-03-23 DIAGNOSIS — J189 Pneumonia, unspecified organism: Secondary | ICD-10-CM

## 2022-03-23 DIAGNOSIS — J984 Other disorders of lung: Secondary | ICD-10-CM

## 2022-03-23 NOTE — Assessment & Plan Note (Signed)
Improved but with some persistent cough so will recheck the CXR for resolution.  I do anticipate some residual findings regardless.  If ok, she will stop the Bactrim.  She will be called if there are any concerns and need to continue with antibiotics.    I have personally spent 30 minutes involved in face-to-face and non-face-to-face activities for this patient on the day of the visit. Professional time spent includes the following activities: Preparing to see the patient (review of tests), Obtaining and/or reviewing separately obtained history (admission/discharge record), Performing a medically appropriate examination and/or evaluation , Ordering medications/tests/procedures, referring and communicating with other health care professionals, Documenting clinical information in the EMR, Independently interpreting results (not separately reported), Communicating results to the patient/family/caregiver, Counseling and educating the patient/family/caregiver and Care coordination (not separately reported).

## 2022-03-23 NOTE — Progress Notes (Signed)
   Subjective:    [name], female    DOB: 1970-04-18, 51 y.o.   MRN: [id number]  HPI Margaret [name] is here for follow up of cavitary pneumonia She has a history of non-tobacco related emphysema and recently with a cavitary pneumonia with Serratia in culture from BAL.  She has been on Bactrim and improving.  She has a persistent cough but maybe some improvement.  Some intermittent productive sputum.  No fever, no chills.     Review of Systems  Constitutional:  Negative for chills and fever.  Respiratory:  Positive for cough. Negative for shortness of breath.   Gastrointestinal:  Negative for diarrhea.       Objective:   Physical Exam Eyes:     General: No scleral icterus. Pulmonary:     Effort: Pulmonary effort is normal.  Skin:    Findings: No rash.  Neurological:     General: No focal deficit present.     Mental Status: She is alert.    SH: no tobacco        Assessment & Plan:

## 2022-03-24 LAB — BASIC METABOLIC PANEL
BUN: 18 mg/dL (ref 7–25)
CO2: 31 mmol/L (ref 20–32)
Calcium: 9.3 mg/dL (ref 8.6–10.4)
Chloride: 102 mmol/L (ref 98–110)
Creat: 0.78 mg/dL (ref 0.50–1.03)
Glucose, Bld: 72 mg/dL (ref 65–99)
Potassium: 4 mmol/L (ref 3.5–5.3)
Sodium: 140 mmol/L (ref 135–146)

## 2022-03-25 ENCOUNTER — Ambulatory Visit
Admission: RE | Admit: 2022-03-25 | Discharge: 2022-03-25 | Disposition: A | Discharge status: Home / Self Care | Payer: Medicare Other | Source: Ambulatory Visit | Attending: Internal Medicine | Admitting: Internal Medicine

## 2022-03-25 DIAGNOSIS — J189 Pneumonia, unspecified organism: Secondary | ICD-10-CM

## 2022-03-25 DIAGNOSIS — J811 Chronic pulmonary edema: Secondary | ICD-10-CM | POA: Diagnosis not present

## 2022-03-29 ENCOUNTER — Other Ambulatory Visit: Payer: Self-pay | Admitting: Family Medicine

## 2022-04-01 ENCOUNTER — Other Ambulatory Visit: Payer: Self-pay | Admitting: Family Medicine

## 2022-04-06 ENCOUNTER — Other Ambulatory Visit: Payer: Self-pay | Admitting: Family Medicine

## 2022-04-06 ENCOUNTER — Telehealth: Payer: Self-pay

## 2022-04-06 NOTE — Telephone Encounter (Signed)
-----   Message from Thayer Headings, MD sent at 04/06/2022  1:39 PM EST ----- Please let her and her caregivers know that her chest xray is good and resolved so she can continue off of the antibiotic.  No follow up with me needed. thanks

## 2022-04-06 NOTE — Telephone Encounter (Signed)
Spoke with emergency contact, Ralph Dowdy (DPR) at patient's group home to relay that per Dr. Linus Salmons, xray looks good and patient can continue off antibiotics with no follow up needed. She requested we let her legal guardian know as well.   Relayed the above information to [place] with [employer] [employer] [facility] ([place]).   Beryle Flock, RN

## 2022-04-07 LAB — ACID FAST CULTURE WITH REFLEXED SENSITIVITIES (MYCOBACTERIA): Acid Fast Culture: NEGATIVE

## 2022-04-13 ENCOUNTER — Other Ambulatory Visit: Payer: 59

## 2022-04-13 DIAGNOSIS — Z79899 Other long term (current) drug therapy: Secondary | ICD-10-CM | POA: Diagnosis not present

## 2022-05-26 ENCOUNTER — Encounter (INDEPENDENT_AMBULATORY_CARE_PROVIDER_SITE_OTHER): Payer: Self-pay | Admitting: Gastroenterology

## 2022-06-09 ENCOUNTER — Other Ambulatory Visit (INDEPENDENT_AMBULATORY_CARE_PROVIDER_SITE_OTHER): Payer: Self-pay

## 2022-06-09 ENCOUNTER — Other Ambulatory Visit: Payer: 59

## 2022-06-09 ENCOUNTER — Encounter (INDEPENDENT_AMBULATORY_CARE_PROVIDER_SITE_OTHER): Payer: Self-pay

## 2022-06-09 DIAGNOSIS — K7400 Hepatic fibrosis, unspecified: Secondary | ICD-10-CM

## 2022-06-09 DIAGNOSIS — Z79899 Other long term (current) drug therapy: Secondary | ICD-10-CM | POA: Diagnosis not present

## 2022-06-09 DIAGNOSIS — R634 Abnormal weight loss: Secondary | ICD-10-CM

## 2022-06-09 DIAGNOSIS — B181 Chronic viral hepatitis B without delta-agent: Secondary | ICD-10-CM

## 2022-06-21 ENCOUNTER — Encounter (INDEPENDENT_AMBULATORY_CARE_PROVIDER_SITE_OTHER): Payer: Self-pay | Admitting: *Deleted

## 2022-07-07 ENCOUNTER — Other Ambulatory Visit: Payer: 59

## 2022-07-07 DIAGNOSIS — K7400 Hepatic fibrosis, unspecified: Secondary | ICD-10-CM | POA: Diagnosis not present

## 2022-07-07 DIAGNOSIS — R634 Abnormal weight loss: Secondary | ICD-10-CM | POA: Diagnosis not present

## 2022-07-08 LAB — AFP TUMOR MARKER: AFP, Serum, Tumor Marker: 2.2 ng/mL (ref 0.0–6.4)

## 2022-07-11 ENCOUNTER — Ambulatory Visit (INDEPENDENT_AMBULATORY_CARE_PROVIDER_SITE_OTHER): Payer: 59 | Admitting: Gastroenterology

## 2022-07-11 ENCOUNTER — Encounter (INDEPENDENT_AMBULATORY_CARE_PROVIDER_SITE_OTHER): Payer: Self-pay | Admitting: Gastroenterology

## 2022-07-11 ENCOUNTER — Other Ambulatory Visit: Payer: 59

## 2022-07-11 ENCOUNTER — Ambulatory Visit (INDEPENDENT_AMBULATORY_CARE_PROVIDER_SITE_OTHER): Payer: Medicare Other | Admitting: Gastroenterology

## 2022-07-11 DIAGNOSIS — Z79899 Other long term (current) drug therapy: Secondary | ICD-10-CM | POA: Diagnosis not present

## 2022-07-12 ENCOUNTER — Other Ambulatory Visit: Payer: Self-pay | Admitting: Family Medicine

## 2022-07-18 ENCOUNTER — Encounter: Payer: Self-pay | Admitting: Family Medicine

## 2022-07-18 ENCOUNTER — Encounter: Payer: 59 | Admitting: Family Medicine

## 2022-07-19 ENCOUNTER — Encounter (INDEPENDENT_AMBULATORY_CARE_PROVIDER_SITE_OTHER): Payer: Self-pay | Admitting: Gastroenterology

## 2022-07-25 ENCOUNTER — Ambulatory Visit (INDEPENDENT_AMBULATORY_CARE_PROVIDER_SITE_OTHER): Payer: 59

## 2022-07-25 VITALS — Ht 64.0 in | Wt 106.0 lb

## 2022-07-25 DIAGNOSIS — Z0000 Encounter for general adult medical examination without abnormal findings: Secondary | ICD-10-CM

## 2022-07-25 NOTE — Patient Instructions (Signed)
Margaret [name] , Thank you for taking time to come for your Medicare Wellness Visit. I appreciate your ongoing commitment to your health goals. Please review the following plan we discussed and let me know if I can assist you in the future.   These are the goals we discussed:  Goals       DIET - INCREASE WATER INTAKE      Try to drink 6-8 glasses of water daily      Patient Stated (pt-stated)      Increase social interaction with friends at the group home.         This is a list of the screening recommended for you and due dates:  Health Maintenance  Topic Date Due   DTaP/Tdap/Td vaccine (1 - Tdap) Never done   Zoster (Shingles) Vaccine (1 of 2) Never done   COVID-19 Vaccine (5 - 2023-24 season) 12/03/2021   Colon Cancer Screening  01/15/2023*   Flu Shot  11/03/2022   Mammogram  01/11/2023   Medicare Annual Wellness Visit  07/25/2023   Pap Smear  03/31/2024   Hepatitis C Screening: USPSTF Recommendation to screen - Ages 18-79 yo.  Completed   HIV Screening  Completed   HPV Vaccine  Aged Out  *Topic was postponed. The date shown is not the original due date.    Advanced directives: Please bring a copy of your health care power of attorney and living will to the office to be added to your chart at your convenience.  Conditions/risks identified: Aim for 30 minutes of exercise or brisk walking, 6-8 glasses of water, and 5 servings of fruits and vegetables each day.  Next appointment: Follow up in one year for your annual wellness visit.   Preventive Care 40-64 Years, Female Preventive care refers to lifestyle choices and visits with your health care provider that can promote health and wellness. What does preventive care include? A yearly physical exam. This is also called an annual well check. Dental exams once or twice a year. Routine eye exams. Ask your health care provider how often you should have your eyes checked. Personal lifestyle choices, including: Daily care of your  teeth and gums. Regular physical activity. Eating a healthy diet. Avoiding tobacco and drug use. Limiting alcohol use. Practicing safe sex. Taking low-dose aspirin daily starting at age 68. Taking vitamin and mineral supplements as recommended by your health care provider. What happens during an annual well check? The services and screenings done by your health care provider during your annual well check will depend on your age, overall health, lifestyle risk factors, and family history of disease. Counseling  Your health care provider may ask you questions about your: Alcohol use. Tobacco use. Drug use. Emotional well-being. Home and relationship well-being. Sexual activity. Eating habits. Work and work Astron[employer]. Method of birth control. Menstrual cycle. Pregnancy history. Screening  You may have the following tests or measurements: Height, weight, and BMI. Blood pressure. Lipid and cholesterol levels. These may be checked every 5 years, or more frequently if you are over 37 years old. Skin check. Lung cancer screening. You may have this screening every year starting at age 65 if you have a 30-pack-year history of smoking and currently smoke or have quit within the past 15 years. Fecal occult blood test (FOBT) of the stool. You may have this test every year starting at age 43. Flexible sigmoidoscopy or colonoscopy. You may have a sigmoidoscopy every 5 years or a colonoscopy every 10 years starting  at age 25. Hepatitis C blood test. Hepatitis B blood test. Sexually transmitted disease (STD) testing. Diabetes screening. This is done by checking your blood sugar (glucose) after you have not eaten for a while (fasting). You may have this done every 1-3 years. Mammogram. This may be done every 1-2 years. Talk to your health care provider about when you should start having regular mammograms. This may depend on whether you have a family history of breast cancer. BRCA-related cancer  screening. This may be done if you have a family history of breast, ovarian, tubal, or peritoneal cancers. Pelvic exam and Pap test. This may be done every 3 years starting at age 18. Starting at age 46, this may be done every 5 years if you have a Pap test in combination with an HPV test. Bone density scan. This is done to screen for osteoporosis. You may have this scan if you are at high risk for osteoporosis. Discuss your test results, treatment options, and if necessary, the need for more tests with your health care provider. Vaccines  Your health care provider may recommend certain vaccines, such as: Influenza vaccine. This is recommended every year. Tetanus, diphtheria, and acellular pertussis (Tdap, Td) vaccine. You may need a Td booster every 10 years. Zoster vaccine. You may need this after age 55. Pneumococcal 13-valent conjugate (PCV13) vaccine. You may need this if you have certain conditions and were not previously vaccinated. Pneumococcal polysaccharide (PPSV23) vaccine. You may need one or two doses if you smoke cigarettes or if you have certain conditions. Talk to your health care provider about which screenings and vaccines you need and how often you need them. This information is not intended to replace advice given to you by your health care provider. Make sure you discuss any questions you have with your health care provider. Document Released: 04/17/2015 Document Revised: 12/09/2015 Document Reviewed: 01/20/2015 Elsevier Interactive Patient Education  2017 [employer].    Fall Prevention in the Home Falls can cause injuries. They can happen to people of all ages. There are many things you can do to make your home safe and to help prevent falls. What can I do on the outside of my home? Regularly fix the edges of walkways and driveways and fix any cracks. Remove anything that might make you trip as you walk through a door, such as a raised step or threshold. Trim any  bushes or trees on the path to your home. Use bright outdoor lighting. Clear any walking paths of anything that might make someone trip, such as rocks or tools. Regularly check to see if handrails are loose or broken. Make sure that both sides of any steps have handrails. Any raised decks and porches should have guardrails on the edges. Have any leaves, snow, or ice cleared regularly. Use sand or salt on walking paths during winter. Clean up any spills in your garage right away. This includes oil or grease spills. What can I do in the bathroom? Use night lights. Install grab bars by the toilet and in the tub and shower. Do not use towel bars as grab bars. Use non-skid mats or decals in the tub or shower. If you need to sit down in the shower, use a plastic, non-slip stool. Keep the floor dry. Clean up any water that spills on the floor as soon as it happens. Remove soap buildup in the tub or shower regularly. Attach bath mats securely with double-sided non-slip rug tape. Do not have throw rugs  and other things on the floor that can make you trip. What can I do in the bedroom? Use night lights. Make sure that you have a light by your bed that is easy to reach. Do not use any sheets or blankets that are too big for your bed. They should not hang down onto the floor. Have a firm chair that has side arms. You can use this for support while you get dressed. Do not have throw rugs and other things on the floor that can make you trip. What can I do in the kitchen? Clean up any spills right away. Avoid walking on wet floors. Keep items that you use a lot in easy-to-reach places. If you need to reach something above you, use a strong step stool that has a grab bar. Keep electrical cords out of the way. Do not use floor polish or wax that makes floors slippery. If you must use wax, use non-skid floor wax. Do not have throw rugs and other things on the floor that can make you trip. What can I do  with my stairs? Do not leave any items on the stairs. Make sure that there are handrails on both sides of the stairs and use them. Fix handrails that are broken or loose. Make sure that handrails are as long as the stairways. Check any carpeting to make sure that it is firmly attached to the stairs. Fix any carpet that is loose or worn. Avoid having throw rugs at the top or bottom of the stairs. If you do have throw rugs, attach them to the floor with carpet tape. Make sure that you have a light switch at the top of the stairs and the bottom of the stairs. If you do not have them, ask someone to add them for you. What else can I do to help prevent falls? Wear shoes that: Do not have high heels. Have rubber bottoms. Are comfortable and fit you well. Are closed at the toe. Do not wear sandals. If you use a stepladder: Make sure that it is fully opened. Do not climb a closed stepladder. Make sure that both sides of the stepladder are locked into place. Ask someone to hold it for you, if possible. Clearly mark and make sure that you can see: Any grab bars or handrails. First and last steps. Where the edge of each step is. Use tools that help you move around (mobility aids) if they are needed. These include: Canes. Walkers. Scooters. Crutches. Turn on the lights when you go into a dark area. Replace any light bulbs as soon as they burn out. Set up your furniture so you have a clear path. Avoid moving your furniture around. If any of your floors are uneven, fix them. If there are any pets around you, be aware of where they are. Review your medicines with your doctor. Some medicines can make you feel dizzy. This can increase your chance of falling. Ask your doctor what other things that you can do to help prevent falls. This information is not intended to replace advice given to you by your health care provider. Make sure you discuss any questions you have with your health care  provider. Document Released: 01/15/2009 Document Revised: 08/27/2015 Document Reviewed: 04/25/2014 Elsevier Interactive Patient Education  2017 [employer].

## 2022-07-25 NOTE — Progress Notes (Signed)
Subjective:   Margaret [name] is a 52 y.o. female who presents for Medicare Annual (Subsequent) preventive examination.  I connected with  Trudi Morgenthaler on 07/25/22 by a audio enabled telemedicine application and verified that I am speaking with the correct person using two identifiers.  Patient Location: Home  Provider Location: Home Office  I discussed the limitations of evaluation and management by telemedicine. The patient expressed understanding and agreed to proceed.  Review of Systems     Cardiac Risk Factors include: sedentary lifestyle;dyslipidemia     Objective:    Today's Vitals   07/25/22 1515  Weight: 106 lb (48.1 kg)  Height: 5\' 4"  (1.626 m)   Body mass index is 18.19 kg/m.     07/25/2022    3:28 PM 02/02/2022    7:32 AM 10/07/2021    2:54 PM 07/23/2021    3:58 PM 05/04/2021    9:28 AM 03/18/2021    1:51 PM 03/04/2021    3:30 AM  Advanced Directives  Does Patient Have a Medical Advance Directive? No No No Unable to assess, patient is non-responsive or altered mental status No No No  Would patient like information on creating a medical advance directive? No - Patient declined No - Patient declined   No - Patient declined No - Patient declined No - Patient declined    Current Medications (verified) Outpatient Encounter Medications as of 07/25/2022  Medication Sig   acetaminophen (TYLENOL) 500 MG tablet Take 1 tablet (500 mg total) by mouth every 6 (six) hours as needed for moderate pain, fever or headache.   albuterol (PROVENTIL) (2.5 MG/3ML) 0.083% nebulizer solution Take 3 mLs (2.5 mg total) by nebulization every 6 (six) hours as needed for wheezing or shortness of breath.   Amantadine HCl 100 MG tablet Take 100 mg by mouth every morning.   Cholecalciferol (VITAMIN D3) 50 MCG (2000 UT) capsule Take 1 capsule (2,000 Units total) by mouth daily.   clozapine (CLOZARIL) 200 MG tablet TAKE 2 TABLETS BY MOUTH AT BEDTIME.   entecavir (BARACLUDE) 0.5 MG tablet TAKE (1)  TABLET BY MOUTH ONCE DAILY.   feeding supplement (BOOST HIGH PROTEIN) LIQD Take 1 Container by mouth 3 (three) times daily between meals. 237 mls Tid between meals.   Fish Oil-Cholecalciferol (FISH OIL-VITAMIN D) 1200-1000 MG-UNIT CAPS TAKE 1 CAPSULE BY MOUTH TWICE DAILY.   FLUoxetine (PROZAC) 20 MG capsule TAKE 3 CAPSULES BY MOUTH ONCE DAILY.   fluticasone (FLONASE) 50 MCG/ACT nasal spray SPRAY 2 SPRAYS IN RIGHT NOSTRIL ONLY TWICE DAILY.   fluticasone-salmeterol (ADVAIR HFA) 115-21 MCG/ACT inhaler Inhale 2 puffs into the lungs 2 (two) times daily.   Gauze Pads & Dressings (STERILE GAUZE) 3"X3" PADS 2 packets by Does not apply route daily.   guaiFENesin (MUCINEX) 600 MG 12 hr tablet Take 2 tablets (1,200 mg total) by mouth 2 (two) times daily.   hydrOXYzine (VISTARIL) 25 MG capsule TAKE 1 CAPSULE BY MOUTH EVERY 8 HOURS AS NEEDED FOR ANXIETY   levonorgestrel (MIRENA) 20 MCG/24HR IUD 1 each by Intrauterine route once.   loratadine (CLARITIN) 10 MG tablet Take 1 tablet (10 mg total) by mouth daily.   mirtazapine (REMERON) 15 MG tablet Take 15 mg by mouth at bedtime.   polyethylene glycol powder (GOODSENSE CLEARLAX) 17 GM/SCOOP powder Take 127.5 g by mouth 2 (two) times daily. (Patient taking differently: Take 1 Container by mouth 2 (two) times daily. 17 grams Bid)   Skin Protectants, Misc. (MINERIN CREME) CREA Apply 1 Application topically See  admin instructions. Apply moderate amount topically to dry skin on soles and heels of both feet after shower   Tiotropium Bromide Monohydrate (SPIRIVA RESPIMAT) 1.25 MCG/ACT AERS Inhale 2 puffs into the lungs daily.   vitamin E 200 UNIT capsule Take 1 capsule (200 Units total) by mouth daily.   No facility-administered encounter medications on file as of 07/25/2022.    Allergies (verified) Lactose; Olanzapine; Tuberculin, ppd; and Milk (cow)   History: Past Medical History:  Diagnosis Date   Anxiety    Depression    Hematuria 11/12/2014   Hepatitis     Hepatitis B carrier    Hypothyroidism    Impulse disorder    IUD (intrauterine device) in place 11/12/2014   Inserted 12/24/12 at [facility]   Liver fibrosis    Mild intellectual disability    Pneumonia 01/2022   Pulmonary nodules 01/2022   Schizophrenia    Tuberculosis    as a child   Urinary frequency 11/12/2014   Past Surgical History:  Procedure Laterality Date   ARM WOUND REPAIR / CLOSURE     BRONCHIAL BRUSHINGS  02/02/2022   Procedure: BRONCHIAL BRUSHINGS;  Surgeon: Martina Sinner, MD;  Location: [facility] ENDOSCOPY;  Service: Pulmonary;;   BRONCHIAL WASHINGS  02/02/2022   Procedure: BRONCHIAL WASHINGS;  Surgeon: Martina Sinner, MD;  Location: [facility] ENDOSCOPY;  Service: Pulmonary;;   TRACHEAL SURGERY     TRACHEOSTOMY     removed   VIDEO BRONCHOSCOPY N/A 02/02/2022   Procedure: VIDEO BRONCHOSCOPY WITH FLUORO;  Surgeon: Martina Sinner, MD;  Location: [facility] ENDOSCOPY;  Service: Pulmonary;  Laterality: N/A;   Family History  Adopted: Yes  Family history unknown: Yes   Social History   Socioeconomic History   Marital status: Single    Spouse name: Not on file   Number of children: 0   Years of education: 12   Highest education level: High school graduate  Occupational History   Occupation: disabled    Comment: due to schizophrenia  Tobacco Use   Smoking status: Never    Passive exposure: Never   Smokeless tobacco: Never  Vaping Use   Vaping Use: Never used  Substance and Sexual Activity   Alcohol use: No    Alcohol/week: 0.0 standard drinks of alcohol   Drug use: No   Sexual activity: Not Currently    Birth control/protection: I.U.D.    Comment: Mirena IUD  Other Topics Concern   Not on file  Social History Narrative   Patient was adopted. She does not have any children and has never been married. She lives in a group home and is disabled. Has a diagnosis of schizophrenia. She socially isolates herself, doesn't like to participate in group activities, sleeps a lot, has  to be prompted to bathe, eat, dress, etc. -07/22/20   Social Determinants of Health   Financial Resource Strain: Low Risk  (07/25/2022)   Overall Financial Resource Strain (CARDIA)    Difficulty of Paying Living Expenses: Not hard at all  Food Insecurity: No Food Insecurity (07/25/2022)   Hunger Vital Sign    Worried About Running Out of Food in the Last Year: Never true    Ran Out of Food in the Last Year: Never true  Transportation Needs: No Transportation Needs (07/25/2022)   PRAPARE - Administrator, Civil Service (Medical): No    Lack of Transportation (Non-Medical): No  Physical Activity: Insufficiently Active (07/25/2022)   Exercise Vital Sign    Days of Exercise  per Week: 3 days    Minutes of Exercise per Session: 30 min  Stress: No Stress Concern Present (07/25/2022)   [employer] of Occupational Health - Occupational Stress Questionnaire    Feeling of Stress : Only a little  Social Connections: Socially Isolated (07/25/2022)   Social Connection and Isolation Panel [NHANES]    Frequency of Communication with Friends and Family: Never    Frequency of Social Gatherings with Friends and Family: More than three times a week    Attends Religious Services: Never    Database administrator or Organizations: No    Attends Engineer, structural: Never    Marital Status: Never married    Tobacco Counseling Counseling given: Not Answered   Clinical Intake:  Pre-visit preparation completed: Yes  Pain : No/denies pain  Diabetes: No  How often do you need to have someone help you when you read instructions, pamphlets, or other written materials from your doctor or pharmacy?: 4 - Often  Diabetic?No   Interpreter Needed?: No  Comments: Assitsted with visit by group home staff Information entered by :: Kandis Fantasia LPN   Activities of Daily Living    07/25/2022    3:26 PM  In your present state of health, do you have any difficulty performing the  following activities:  Hearing? 0  Vision? 0  Difficulty concentrating or making decisions? 1  Walking or climbing stairs? 0  Dressing or bathing? 0  Doing errands, shopping? 1  Preparing Food and eating ? N  Using the Toilet? N  In the past six months, have you accidently leaked urine? N  Do you have problems with loss of bowel control? N  Managing your Medications? N  Managing your Finances? N  Housekeeping or managing your Housekeeping? N    Patient Care Team: Dettinger, Elige Radon, MD as PCP - General (Family Medicine) Malissa Hippo, MD as Consulting Physician (Gastroenterology) Doreatha Massed, MD as Medical Oncologist (Medical Oncology) Therese Bani, RN as Oncology Nurse Navigator (Oncology) Luciano Cutter, MD as Consulting Physician (Pulmonary Disease) Carver Fila, MD as Consulting Physician (Gynecologic Oncology) Antonietta Breach, MD as Referring Physician (Neurology) Comer, Belia Heman, MD as Consulting Physician (Infectious Diseases)  Indicate any recent Medical Services you may have received from other than [facility] providers in the past year (date may be approximate).     Assessment:   This is a routine wellness examination for Margaret [name].  Hearing/Vision screen Hearing Screening - Comments:: Denies hearing difficulties   Vision Screening - Comments:: No vision problems; will schedule routine eye exam soon    Dietary issues and exercise activities discussed: Current Exercise Habits: The patient does not participate in regular exercise at present   Goals Addressed   None   Depression Screen    07/25/2022    3:31 PM 02/22/2022    3:02 PM 02/07/2022    2:04 PM 01/14/2022    2:13 PM 10/22/2021    1:10 PM 07/23/2021    3:52 PM 07/08/2021    2:37 PM  PHQ 2/9 Scores  PHQ - 2 Score 0 0 PHQ- 9 Score     Exception Documentation    Patient refusal       Fall Risk    07/25/2022    3:25 PM 02/22/2022    3:02 PM 02/07/2022    2:04 PM  10/22/2021    1:10 PM 07/23/2021    3:49  PM  Fall Risk   Falls in the past year? 0 0 0 0 0  Number falls in past yr: 0 0 0  0  Injury with Fall? 0 0 0  0  Risk for fall due to : No Fall Risks  No Fall Risks  No Fall Risks  Follow up Falls prevention discussed;Education provided;Falls evaluation completed  Falls evaluation completed  Falls prevention discussed    FALL RISK PREVENTION PERTAINING TO THE HOME:  Any stairs in or around the home? No  If so, are there any without handrails? No  Home free of loose throw rugs in walkways, pet beds, electrical cords, etc? Yes  Adequate lighting in your home to reduce risk of falls? Yes   ASSISTIVE DEVICES UTILIZED TO PREVENT FALLS:  Life alert? No  Use of a cane, walker or w/c? No  Grab bars in the bathroom? Yes  Shower chair or bench in shower? No  Elevated toilet seat or a handicapped toilet? Yes   TIMED UP AND GO:  Was the test performed? No . Telephonic visit   Cognitive Function:        07/25/2022    3:27 PM 07/23/2021    4:00 PM 04/26/2018   11:36 AM  6CIT Screen  What Year? 0 points 0 points 0 points  What month? 0 points 0 points 0 points  What time? 0 points 0 points 0 points  Count back from 20 0 points 0 points 0 points  Months in reverse 4 points 4 points 2 points  Repeat phrase 6 points 8 points 10 points  Total Score 10 points 12 points 12 points    Immunizations Immunization History  Administered Date(s) Administered   Influenza,inj,Quad PF,6+ Mos 12/30/2015, 12/30/2016, 03/14/2018, 02/20/2019, 12/28/2021   Influenza-Unspecified 01/12/2015, 01/07/2020, 01/09/2021   Moderna Covid-19 Vaccine Bivalent Booster 41yrs & up 02/15/2021   Moderna Sars-Covid-2 Vaccination 06/10/2019, 07/09/2019, 02/11/2020    TDAP status: Due, Education has been provided regarding the importance of this vaccine. Advised may receive this vaccine at local pharmacy or Health Dept. Aware to provide a copy of the vaccination record if  obtained from local pharmacy or Health Dept. Verbalized acceptance and understanding.  Flu Vaccine status: Up to date  Pneumococcal vaccine status: Up to date  Covid-19 vaccine status: Information provided on how to obtain vaccines.   Qualifies for Shingles Vaccine? Yes   Zostavax completed No   Shingrix Completed?: No.    Education has been provided regarding the importance of this vaccine. Patient has been advised to call insurance company to determine out of pocket expense if they have not yet received this vaccine. Advised may also receive vaccine at local pharmacy or Health Dept. Verbalized acceptance and understanding.  Screening Tests Health Maintenance  Topic Date Due   DTaP/Tdap/Td (1 - Tdap) Never done   Zoster Vaccines- Shingrix (1 of 2) Never done   COVID-19 Vaccine (5 - 2023-24 season) 12/03/2021   COLONOSCOPY (Pts 45-60yrs Insurance coverage will need to be confirmed)  01/15/2023 (Originally 03/13/2017)   INFLUENZA VACCINE  11/03/2022   MAMMOGRAM  01/11/2023   Medicare Annual Wellness (AWV)  07/25/2023   PAP SMEAR-Modifier  03/31/2024   Hepatitis C Screening  Completed   HIV Screening  Completed   HPV VACCINES  Aged Out    Health Maintenance  Health Maintenance Due  Topic Date Due   DTaP/Tdap/Td (1 - Tdap) Never done   Zoster Vaccines- Shingrix (1 of 2) Never done   COVID-19  Vaccine (5 - 2023-24 season) 12/03/2021    Colorectal cancer status: Declines at this time   Mammogram status: Completed 01/10/22. Repeat every year  Lung Cancer Screening: (Low Dose CT Chest recommended if Age 36-80 years, 30 pack-year currently smoking OR have quit w/in 15years.) does not qualify.   Lung Cancer Screening Referral: n/a  Additional Screening:  Hepatitis C Screening: does qualify; Completed 09/19/17  Vision Screening: Recommended annual ophthalmology exams for early detection of glaucoma and other disorders of the eye. Is the patient up to date with their annual eye  exam?  No  Who is the provider or what is the name of the office in which the patient attends annual eye exams? none If pt is not established with a provider, would they like to be referred to a provider to establish care? No .   Dental Screening: Recommended annual dental exams for proper oral hygiene  Community Resource Referral / Chronic Care Management: CRR required this visit?  No   CCM required this visit?  No      Plan:     I have personally reviewed and noted the following in the patient's chart:   Medical and social history Use of alcohol, tobacco or illicit drugs  Current medications and supplements including opioid prescriptions. Patient is not currently taking opioid prescriptions. Functional ability and status Nutritional status Physical activity Advanced directives List of other physicians Hospitalizations, surgeries, and ER visits in previous 12 months Vitals Screenings to include cognitive, depression, and falls Referrals and appointments  In addition, I have reviewed and discussed with patient certain preventive protocols, quality metrics, and best practice recommendations. A written personalized care plan for preventive services as well as general preventive health recommendations were provided to patient.     Durwin Nora, [place]   1/61/0960   Due to this being a virtual visit, the after visit summary with patients personalized plan was offered to patient via mail or my-chart. Patient preferred to pick up at office at next visit  Nurse Notes: No concerns

## 2022-08-08 ENCOUNTER — Other Ambulatory Visit: Payer: 59

## 2022-08-08 DIAGNOSIS — Z79899 Other long term (current) drug therapy: Secondary | ICD-10-CM | POA: Diagnosis not present

## 2022-09-05 ENCOUNTER — Ambulatory Visit (INDEPENDENT_AMBULATORY_CARE_PROVIDER_SITE_OTHER): Payer: Medicare Other | Admitting: Gastroenterology

## 2022-09-08 ENCOUNTER — Other Ambulatory Visit: Payer: 59

## 2022-09-08 DIAGNOSIS — Z79899 Other long term (current) drug therapy: Secondary | ICD-10-CM | POA: Diagnosis not present

## 2022-09-29 ENCOUNTER — Ambulatory Visit (INDEPENDENT_AMBULATORY_CARE_PROVIDER_SITE_OTHER): Payer: 59 | Admitting: Family Medicine

## 2022-09-29 ENCOUNTER — Encounter: Payer: Self-pay | Admitting: Family Medicine

## 2022-09-29 VITALS — BP 115/68 | HR 102 | Ht 64.0 in | Wt 111.0 lb

## 2022-09-29 DIAGNOSIS — F209 Schizophrenia, unspecified: Secondary | ICD-10-CM

## 2022-09-29 DIAGNOSIS — Z0001 Encounter for general adult medical examination with abnormal findings: Secondary | ICD-10-CM

## 2022-09-29 DIAGNOSIS — E782 Mixed hyperlipidemia: Secondary | ICD-10-CM | POA: Diagnosis not present

## 2022-09-29 DIAGNOSIS — Z0000 Encounter for general adult medical examination without abnormal findings: Secondary | ICD-10-CM

## 2022-09-29 NOTE — Progress Notes (Signed)
BP 115/68   Pulse (!) 102   Ht 5\' 4"  (1.626 m)   Wt 111 lb (50.3 kg)   SpO2 95%   BMI 19.05 kg/m    Subjective:   [name], female    DOB: 09/21/1970, 52 y.o.   MRN: [id number]  HPI: Margaret [name] is a 52 y.o. female presenting on 09/29/2022 for Medical Management of Chronic Issues (CPE, no pap)   HPI Physical Exam Patient denies any chest pain, shortness of breath, headaches or vision issues, abdominal complaints, diarrhea, nausea, vomiting, or joint issues.   Hyperlipidemia Patient is coming in for recheck of his hyperlipidemia. The patient is currently taking fish oil. They deny any issues with myalgias or history of liver damage from it. They deny any focal numbness or weakness or chest pain.   Relevant past medical, surgical, family and social history reviewed and updated as indicated. Interim medical history since our last visit reviewed. Allergies and medications reviewed and updated.  Review of Systems  Constitutional:  Negative for chills and fever.  HENT:  Negative for congestion, ear discharge and ear pain.   Eyes:  Negative for redness and visual disturbance.  Respiratory:  Negative for chest tightness and shortness of breath.   Cardiovascular:  Negative for chest pain and leg swelling.  Genitourinary:  Negative for difficulty urinating and dysuria.  Musculoskeletal:  Negative for back pain and gait problem.  Skin:  Negative for rash.  Neurological:  Negative for light-headedness and headaches.  Psychiatric/Behavioral:  Negative for agitation and behavioral problems.   All other systems reviewed and are negative.   Per HPI unless specifically indicated above   Allergies as of 09/29/2022       Reactions   Lactose Nausea And Vomiting   GI upset   Olanzapine    Other reaction(s): UNKNOWN   Tuberculin, Ppd    Other reaction(s): UNKNOWN   Milk (cow) Nausea And Vomiting   Other reaction(s): GI upset        Medication List         Accurate as of September 29, 2022  1:20 PM. If you have any questions, ask your nurse or doctor.          acetaminophen 500 MG tablet Commonly known as: TYLENOL Take 1 tablet (500 mg total) by mouth every 6 (six) hours as needed for moderate pain, fever or headache.   albuterol (2.5 MG/3ML) 0.083% nebulizer solution Commonly known as: PROVENTIL Take 3 mLs (2.5 mg total) by nebulization every 6 (six) hours as needed for wheezing or shortness of breath.   Amantadine HCl 100 MG tablet Take 100 mg by mouth every morning.   clozapine 200 MG tablet Commonly known as: CLOZARIL TAKE 2 TABLETS BY MOUTH AT BEDTIME.   entecavir 0.5 MG tablet Commonly known as: BARACLUDE TAKE (1) TABLET BY MOUTH ONCE DAILY.   feeding supplement Liqd Take 1 Container by mouth 3 (three) times daily between meals. 237 mls Tid between meals.   Fish Oil-Vitamin D 1200-1000 MG-UNIT Caps TAKE 1 CAPSULE BY MOUTH TWICE DAILY.   FLUoxetine 20 MG capsule Commonly known as: PROZAC TAKE 3 CAPSULES BY MOUTH ONCE DAILY.   fluticasone 50 MCG/ACT nasal spray Commonly known as: FLONASE SPRAY 2 SPRAYS IN RIGHT NOSTRIL ONLY TWICE DAILY.   fluticasone-salmeterol 115-21 MCG/ACT inhaler Commonly known as: Advair HFA Inhale 2 puffs into the lungs 2 (two) times daily.   guaiFENesin 600 MG 12 hr tablet Commonly known as: Mucinex Take 2  tablets (1,200 mg total) by mouth 2 (two) times daily.   hydrOXYzine 25 MG capsule Commonly known as: VISTARIL TAKE 1 CAPSULE BY MOUTH EVERY 8 HOURS AS NEEDED FOR ANXIETY   levonorgestrel 20 MCG/24HR IUD Commonly known as: MIRENA 1 each by Intrauterine route once.   loratadine 10 MG tablet Commonly known as: Claritin Take 1 tablet (10 mg total) by mouth daily.   Minerin Creme Crea Apply 1 Application topically See admin instructions. Apply moderate amount topically to dry skin on soles and heels of both feet after shower   mirtazapine 15 MG tablet Commonly known as: REMERON Take  15 mg by mouth at bedtime.   polyethylene glycol powder 17 GM/SCOOP powder Commonly known as: GoodSense ClearLax Take 127.5 g by mouth 2 (two) times daily. What changed:  how much to take additional instructions   Spiriva Respimat 1.25 MCG/ACT Aers Generic drug: Tiotropium Bromide Monohydrate Inhale 2 puffs into the lungs daily.   Sterile Gauze 3"X3" Pads 2 packets by Does not apply route daily.   Vitamin D3 50 MCG (2000 UT) capsule Take 1 capsule (2,000 Units total) by mouth daily.   vitamin E 200 UNIT capsule Take 1 capsule (200 Units total) by mouth daily.         Objective:   BP 115/68   Pulse (!) 102   Ht 5\' 4"  (1.626 m)   Wt 111 lb (50.3 kg)   SpO2 95%   BMI 19.05 kg/m   Wt Readings from Last 3 Encounters:  09/29/22 111 lb (50.3 kg)  07/25/22 106 lb (48.1 kg)  03/23/22 106 lb (48.1 kg)    Physical Exam Vitals and nursing note reviewed.  Constitutional:      General: She is not in acute distress.    Appearance: She is well-developed. She is not diaphoretic.  Eyes:     Conjunctiva/sclera: Conjunctivae normal.     Pupils: Pupils are equal, round, and reactive to light.  Cardiovascular:     Rate and Rhythm: Normal rate and regular rhythm.     Heart sounds: Normal heart sounds. No murmur heard. Pulmonary:     Effort: Pulmonary effort is normal. No respiratory distress.     Breath sounds: Normal breath sounds. No wheezing.  Musculoskeletal:        General: No tenderness. Normal range of motion.  Skin:    General: Skin is warm and dry.     Findings: No rash.  Neurological:     Mental Status: She is alert and oriented to person, place, and time.     Coordination: Coordination normal.  Psychiatric:        Behavior: Behavior normal.       Assessment & Plan:   Problem List Items Addressed This Visit       Other   Hyperlipidemia   Relevant Orders   CBC with Differential/Platelet   CMP14+EGFR   Lipid panel   TSH   Schizophrenia (HCC)    Relevant Orders   CBC with Differential/Platelet   CMP14+EGFR   Lipid panel   TSH   Other Visit Diagnoses     Physical exam    -  Primary   Relevant Orders   CBC with Differential/Platelet   CMP14+EGFR   Lipid panel   TSH       Continue current treatment, seems to be doing well.  Will check blood work today and see back in 6 months. Follow up plan: Return in about 6 months (around 03/31/2023), or if  symptoms worsen or fail to improve, for Hyperlipidemia.  Counseling provided for all of the vaccine components Orders Placed This Encounter  Procedures   CBC with Differential/Platelet   CMP14+EGFR   Lipid panel   TSH    Arville Care, MD [place] 09/29/2022, 1:20 PM

## 2022-09-30 LAB — CMP14+EGFR
ALT: 12 IU/L (ref 0–32)
AST: 15 IU/L (ref 0–40)
Albumin: 3.8 g/dL — ABNORMAL LOW (ref 3.9–4.9)
Alkaline Phosphatase: 120 IU/L (ref 44–121)
BUN/Creatinine Ratio: 18 (ref 9–23)
BUN: 10 mg/dL (ref 6–24)
Bilirubin Total: 0.2 mg/dL (ref 0.0–1.2)
CO2: 23 mmol/L (ref 20–29)
Calcium: 8.7 mg/dL (ref 8.7–10.2)
Chloride: 103 mmol/L (ref 96–106)
Creatinine, Ser: 0.57 mg/dL (ref 0.57–1.00)
Globulin, Total: 3.2 g/dL (ref 1.5–4.5)
Glucose: 83 mg/dL (ref 70–99)
Potassium: 4 mmol/L (ref 3.5–5.2)
Sodium: 140 mmol/L (ref 134–144)
Total Protein: 7 g/dL (ref 6.0–8.5)
eGFR: 111 mL/min/{1.73_m2} (ref >59)

## 2022-09-30 LAB — CBC WITH DIFFERENTIAL/PLATELET
Basophils Absolute: 0.1 10*3/uL (ref 0.0–0.2)
Basos: 1 %
EOS (ABSOLUTE): 0.1 10*3/uL (ref 0.0–0.4)
Eos: 1 %
Hematocrit: 32.7 % — ABNORMAL LOW (ref 34.0–46.6)
Hemoglobin: 10.3 g/dL — ABNORMAL LOW (ref 11.1–15.9)
Immature Grans (Abs): 0 10*3/uL (ref 0.0–0.1)
Immature Granulocytes: 0 %
Lymphocytes Absolute: 1.7 10*3/uL (ref 0.7–3.1)
Lymphs: 11 %
MCH: 26.5 pg — ABNORMAL LOW (ref 26.6–33.0)
MCHC: 31.5 g/dL (ref 31.5–35.7)
MCV: 84 fL (ref 79–97)
Monocytes Absolute: 0.7 10*3/uL (ref 0.1–0.9)
Monocytes: 5 %
Neutrophils Absolute: 12.5 10*3/uL — ABNORMAL HIGH (ref 1.4–7.0)
Neutrophils: 82 %
Platelets: 333 10*3/uL (ref 150–450)
RBC: 3.89 x10E6/uL (ref 3.77–5.28)
RDW: 14.4 % (ref 11.7–15.4)
WBC: 15.2 10*3/uL — ABNORMAL HIGH (ref 3.4–10.8)

## 2022-09-30 LAB — LIPID PANEL
Chol/HDL Ratio: 2.4 ratio (ref 0.0–4.4)
Cholesterol, Total: 133 mg/dL (ref 100–199)
HDL: 55 mg/dL (ref >39)
LDL Chol Calc (NIH): 63 mg/dL (ref 0–99)
Triglycerides: 77 mg/dL (ref 0–149)
VLDL Cholesterol Cal: 15 mg/dL (ref 5–40)

## 2022-09-30 LAB — TSH: TSH: 2.25 u[IU]/mL (ref 0.450–4.500)

## 2022-10-07 ENCOUNTER — Other Ambulatory Visit: Payer: 59

## 2022-10-07 DIAGNOSIS — Z79899 Other long term (current) drug therapy: Secondary | ICD-10-CM | POA: Diagnosis not present

## 2022-11-01 ENCOUNTER — Ambulatory Visit (INDEPENDENT_AMBULATORY_CARE_PROVIDER_SITE_OTHER): Payer: 59 | Admitting: Gastroenterology

## 2022-11-07 ENCOUNTER — Other Ambulatory Visit: Payer: 59

## 2022-11-07 DIAGNOSIS — Z79899 Other long term (current) drug therapy: Secondary | ICD-10-CM | POA: Diagnosis not present

## 2022-11-10 ENCOUNTER — Ambulatory Visit (INDEPENDENT_AMBULATORY_CARE_PROVIDER_SITE_OTHER): Payer: 59 | Admitting: Gastroenterology

## 2022-11-16 ENCOUNTER — Telehealth: Payer: Self-pay | Admitting: Gastroenterology

## 2022-11-16 ENCOUNTER — Encounter: Payer: Self-pay | Admitting: Gastroenterology

## 2022-11-16 ENCOUNTER — Ambulatory Visit (INDEPENDENT_AMBULATORY_CARE_PROVIDER_SITE_OTHER): Payer: 59 | Admitting: Gastroenterology

## 2022-11-16 VITALS — BP 101/66 | HR 89 | Temp 97.9°F | Ht 64.0 in | Wt 114.0 lb

## 2022-11-16 DIAGNOSIS — B181 Chronic viral hepatitis B without delta-agent: Secondary | ICD-10-CM

## 2022-11-16 NOTE — H&P (View-Only) (Signed)
 GI Office Note    Referring Provider: Dettinger, Fonda LABOR, MD Primary Care Physician:  Dettinger, Fonda LABOR, MD Primary Gastroenterologist: Toribio Rubins, MD   Date:  11/16/2022  ID:  Margaret [name], DOB Jan 18, 1971, MRN [id number]   Chief Complaint   Chief Complaint  Patient presents with   Follow-up    Follow up Hep B, colonoscopy (pt of [facility])   History of Present Illness  Margaret [name] is a 52 y.o. female with a history of hepatitis B, hypothyroidism, impulse disorder, mild intellectual disability, schizophrenia presenting today for follow-up of chronic hepatitis B.  History of pneumonia and right empyema requiring chest tube in October 2022.  Appears she has had recurrent episodes of pneumonia periodically since then.  Previously followed by Dr. Katragadda in regards to multiple pulmonary nodules.   Liver biopsy December 2016 and chronic hepatitis B and fibrosis around central veins.  Elastography in 2016 with F2/F3 disease, ultrasound in February 2021 with no liver abnormalities.  Maintained on Entecavir  0.5 mg daily, hep B surface antigen and HBV DNA ordered in April 2023 that was not completed.  Her previous HBV DNA was undetectable in March 2022 without seroconversion given hep B surface antigen was still reactive.  Last office visit 01/06/2022.  Patient accompanied with her caregiver Margaret [name] who reported she was doing well overall.  Eating well and mostly eating anything they provide to her at the group home.  She denied any abdominal pain, nausea, vomiting, rectal bleeding, melena.  Had had a recent ED visit in July for pneumonia possibly secondary to aspiration.  She had been compliant with Entecavir  0.5 mg daily.  No red flags symptoms were present.  Never had EGD or colonoscopy.  HBV DNA and hep B surface antigen ordered and elastography ordered.  Discussed importance of colonoscopy and at minimum Cologuard they advised that they would speak to patient's legal guardian  regarding how to proceed.  Advised follow-up in 6 months.  Ultrasound elastography October 2023: -kPa 3.3 -No liver mass or hepato-biliary ductal dilation noted  Labs in November 2023 with positive hep B surface antigen. Advised to continue entecavir  0.5 mg daily.   Normal AFP and April 2024  Recent labs 09/29/2022: Albumin 3.8, AST 15, ALT 12, alk phos 120, T. bili 0.2.  WBC 15.2, hemoglobin 10.3.  Normal lipid panel and TSH.  Today: Margaret [name] is present with her today from group home and helps to provide history. She denies any issues or complaints of N/V with dairy products.   Has a BM everyday and is soft and normal, occasional smearing in underwear. No abdominal pain, jaundice, melena, brbpr, fevers, lack of appetite weight loss, reflux, dyspahgia. Sleeps well mos of the time.  Denies any extremity erythema or any jaundice.  She does wear depends and is incontinent of urine mostly.   Does report a cough but has history of aspiration pneumonia. Happens more during the fall and winter time or season change. No recent issues.   Current Outpatient Medications  Medication Sig Dispense Refill   acetaminophen  (TYLENOL ) 500 MG tablet Take 1 tablet (500 mg total) by mouth every 6 (six) hours as needed for moderate pain, fever or headache. 90 tablet 1   albuterol  (PROVENTIL ) (2.5 MG/3ML) 0.083% nebulizer solution Take 3 mLs (2.5 mg total) by nebulization every 6 (six) hours as needed for wheezing or shortness of breath. 75 mL 5   Amantadine  HCl 100 MG tablet Take 100 mg by mouth every morning.     Cholecalciferol (  VITAMIN D3) 50 MCG (2000 UT) capsule Take 1 capsule (2,000 Units total) by mouth daily. 30 capsule PRN   clozapine  (CLOZARIL ) 200 MG tablet TAKE 2 TABLETS BY MOUTH AT BEDTIME. 60 tablet 1   entecavir  (BARACLUDE ) 0.5 MG tablet TAKE (1) TABLET BY MOUTH ONCE DAILY. 30 tablet 11   feeding supplement (BOOST HIGH PROTEIN) LIQD Take 1 Container by mouth 3 (three) times daily between meals. 237  mls Tid between meals.     Fish Oil -Cholecalciferol (FISH OIL -VITAMIN D ) 1200-1000 MG-UNIT CAPS TAKE 1 CAPSULE BY MOUTH TWICE DAILY. 60 capsule 11   FLUoxetine  (PROZAC ) 20 MG capsule TAKE 3 CAPSULES BY MOUTH ONCE DAILY. 90 capsule 1   fluticasone  (FLONASE ) 50 MCG/ACT nasal spray SPRAY 2 SPRAYS IN RIGHT NOSTRIL ONLY TWICE DAILY. 16 g 3   fluticasone -salmeterol (ADVAIR HFA) 115-21 MCG/ACT inhaler Inhale 2 puffs into the lungs 2 (two) times daily. 1 each 5   guaiFENesin  (MUCINEX ) 600 MG 12 hr tablet Take 2 tablets (1,200 mg total) by mouth 2 (two) times daily. 120 tablet 2   hydrOXYzine  (VISTARIL ) 25 MG capsule TAKE 1 CAPSULE BY MOUTH EVERY 8 HOURS AS NEEDED FOR ANXIETY 90 capsule 11   levonorgestrel  (MIRENA ) 20 MCG/24HR IUD 1 each by Intrauterine route once.     loratadine  (CLARITIN ) 10 MG tablet Take 1 tablet (10 mg total) by mouth daily. 90 tablet 3   mirtazapine  (REMERON ) 15 MG tablet Take 15 mg by mouth at bedtime.     polyethylene glycol powder (GOODSENSE CLEARLAX) 17 GM/SCOOP powder Take 127.5 g by mouth 2 (two) times daily. (Patient taking differently: Take 1 Container by mouth 2 (two) times daily. 17 grams Bid)     Skin Protectants, Misc. (MINERIN CREME) CREA Apply 1 Application topically See admin instructions. Apply moderate amount topically to dry skin on soles and heels of both feet after shower     Tiotropium Bromide Monohydrate  (SPIRIVA  RESPIMAT) 1.25 MCG/ACT AERS Inhale 2 puffs into the lungs daily. 4 g 5   vitamin E  200 UNIT capsule Take 1 capsule (200 Units total) by mouth daily. 30 capsule 11   Gauze Pads & Dressings (STERILE GAUZE) 3"X3" PADS 2 packets by Does not apply route daily. (Patient not taking: Reported on 11/16/2022) 30 each 1   No current facility-administered medications for this visit.    Past Medical History:  Diagnosis Date   Anxiety    Depression    Hematuria 11/12/2014   Hepatitis    Hepatitis B carrier (HCC)    Hypothyroidism    Impulse disorder    IUD  (intrauterine device) in place 11/12/2014   Inserted 12/24/12 at [facility]   Liver fibrosis    Mild intellectual disability    Pneumonia 01/2022   Pulmonary nodules 01/2022   Schizophrenia (HCC)    Tuberculosis    as a child   Urinary frequency 11/12/2014    Past Surgical History:  Procedure Laterality Date   ARM WOUND REPAIR / CLOSURE     BRONCHIAL BRUSHINGS  02/02/2022   Procedure: BRONCHIAL BRUSHINGS;  Surgeon: Kara Dorn NOVAK, MD;  Location[facility] ENDOSCOPY;  Service: Pulmonary;;   BRONCHIAL WASHINGS  02/02/2022   Procedure: BRONCHIAL WASHINGS;  Surgeon: Kara Dorn NOVAK, MD;  Location: [facility] ENDOSCOPY;  Service: Pulmonary;;   TRACHEAL SURGERY     TRACHEOSTOMY     removed   VIDEO BRONCHOSCOPY N/A 02/02/2022   Procedure: VIDEO BRONCHOSCOPY WITH FLUORO;  Surgeon: Kara Dorn NOVAK, MD;  Location: [facility] ENDOSCOPY;  Service: Pulmonary;  Laterality: N/A;    Family History  Adopted: Yes  Family history unknown: Yes    Allergies as of 11/16/2022 - Review Complete 11/16/2022  Allergen Reaction Noted   Lactose Nausea And Vomiting 08/06/2012   Olanzapine  08/06/2012   Tuberculin, ppd  08/06/2012   Milk (cow) Nausea And Vomiting 08/06/2012    Social History   Socioeconomic History   Marital status: Single    Spouse name: Not on file   Number of children: 0   Years of education: 12   Highest education level: High school graduate  Occupational History   Occupation: disabled    Comment: due to schizophrenia  Tobacco Use   Smoking status: Never    Passive exposure: Never   Smokeless tobacco: Never  Vaping Use   Vaping status: Never Used  Substance and Sexual Activity   Alcohol use: No    Alcohol/week: 0.0 standard drinks of alcohol   Drug use: No   Sexual activity: Not Currently    Birth control/protection: I.U.D.    Comment: Mirena  IUD  Other Topics Concern   Not on file  Social History Narrative   Patient was adopted. She does not have any children and has never been  married. She lives in a group home and is disabled. Has a diagnosis of schizophrenia. She socially isolates herself, doesn't like to participate in group activities, sleeps a lot, has to be prompted to bathe, eat, dress, etc. -07/22/20   Social Determinants of Health   Financial Resource Strain: Low Risk  (07/25/2022)   Overall Financial Resource Strain (CARDIA)    Difficulty of Paying Living Expenses: Not hard at all  Food Insecurity: No Food Insecurity (07/25/2022)   Hunger Vital Sign    Worried About Running Out of Food in the Last Year: Never true    Ran Out of Food in the Last Year: Never true  Transportation Needs: No Transportation Needs (07/25/2022)   PRAPARE - Administrator, Civil Service (Medical): No    Lack of Transportation (Non-Medical): No  Physical Activity: Insufficiently Active (07/25/2022)   Exercise Vital Sign    Days of Exercise per Week: 3 days    Minutes of Exercise per Session: 30 min  Stress: No Stress Concern Present (07/25/2022)   [employer] of Occupational Health - Occupational Stress Questionnaire    Feeling of Stress : Only a little  Social Connections: Socially Isolated (07/25/2022)   Social Connection and Isolation Panel [NHANES]    Frequency of Communication with Friends and Family: Never    Frequency of Social Gatherings with Friends and Family: More than three times a week    Attends Religious Services: Never    Database administrator or Organizations: No    Attends Banker Meetings: Never    Marital Status: Never married     Review of Systems   Gen: Denies fever, chills, anorexia. Denies fatigue, weakness, weight loss.  CV: Denies chest pain, palpitations, syncope, peripheral edema, and claudication. Resp: Denies dyspnea at rest, cough, wheezing, coughing up blood, and pleurisy. GI: See HPI Derm: Denies rash, itching, dry skin Psych: Denies depression, anxiety, memory loss, confusion. No homicidal or suicidal  ideation.  Heme: Denies bruising, bleeding, and enlarged lymph nodes.  Physical Exam   BP 101/66   Pulse 89   Temp 97.9 F (36.6 C)   Ht 5\' 4"  (1.626 m)   Wt 114 lb (51.7 kg)   BMI 19.57 kg/m   General:  No distress noted. Pleasant and cooperative.  No jaundice Head:  Normocephalic and atraumatic. Eyes:  Conjuctiva clear without scleral icterus. Mouth:  Oral mucosa pink and moist. Good dentition. No lesions. Lungs:  Clear to auscultation bilaterally. No wheezes, rales, or rhonchi. No distress.  Heart:  S1, S2 present without murmurs appreciated.  Abdomen:  +BS, soft, non-tender and non-distended. No rebound or guarding. No HSM or masses noted.  Rectal: deferred Msk:  Symmetrical without gross deformities. Normal posture. Extremities:  Without edema.  No evidence of erythema or jaundice.  Neurologic:  Alert and  oriented x4 Psych:  Alert and cooperative. Normal mood and affect.  Assessment  Lianni Kanaan is a 53 y.o. female with a history of hepatitis B, hypothyroidism, impulse disorder, mild intellectual disability, schizophrenia presenting today for follow-up.  Chronic hepatitis B: Diagnosed via liver biopsy in 2016.  Hepatitis B DNA level undetectable in March 2022. She still has not had seroconversion given hep B surface antigen still reactive as of November 2023. Ultrasound with elastography in October 2023 with low kPa (minimal fibrosis) and no evidence of liver lesion or hepatobiliary ductal dilation.  She has been compliant with Entecavir  0.5 mg daily and LFTs remain within normal limits based on her last lab work from June.  AFP within normal limits in April 2024.  Given patient's age and and is not she is at higher risk for hepatocellular carcinoma in the setting of her chronic hepatitis B therefore she is in need of ultrasound and AFP every 6 months.  When I will plan to perform prior quadrant ultrasound and obtain hep B DNA and surface antigen labs.  Will plan to continue  Entecavir  0.5 mg daily, if evidence of increased viral load may need to consider resistance testing.  Screening for colon cancer: No alarm symptoms present.  No weight loss. Spoke with Margaret [name] her caregiver from the group home who feels as though they would have better luck with colonoscopy versus Cologuard given patient likely would not notify staff when she needs to go to the bathroom to be able to collect sample.  Spoke with Margaret [name] with [employer] regarding the procedure and importance of colon cancer screening with colonoscopy versus Cologuard and she has agreed to proceed with colonoscopy therefore we will work on getting patient scheduled for first-ever screening.  PLAN   HBV DNA and Hep B surface Ag and AFP RUQ US  now RUQ US  and AFP every 6 months.  Continue Entecavir  0.5 mg daily Proceed with colonoscopy with propofol  by Dr. Eartha in near future: the risks, benefits, and alternatives have been discussed with the patient in detail. The patient states understanding and desires to proceed. ASA 3 UPT Follow up in 6 months.     Charmaine Melia, MSN, FNP-BC, AGACNP-BC [facility]  I have reviewed the note and agree with the APP's assessment as described in this progress note  Patient with evidence of F2-F3 fibrosis on previous investigations.  Given ethnicity, it is reasonable to proceed with ultrasound and AFP every 6 months.  She will continue with Entecavir  daily.  No need to check CMP as she had this lab drawn less than 2 months ago which showed normal creatinine and liver function.  Toribio Eartha, MD Gastroenterology and Hepatology [facility]

## 2022-11-16 NOTE — Telephone Encounter (Signed)
Will call pt to schedule once we start scheduling for Dr. Levon Hedger future schedule

## 2022-11-16 NOTE — Progress Notes (Addendum)
GI Office Note    Referring Provider: Dettinger, Elige Radon, MD Primary Care Physician:  Dettinger, Elige Radon, MD Primary Gastroenterologist: Dolores Frame, MD   Date:  11/16/2022  ID:  Margaret [name], DOB 1970-09-06, MRN [id number]   Chief Complaint   Chief Complaint  Patient presents with   Follow-up    Follow up Hep B, colonoscopy ([facility] of [facility])   History of Present Illness  Margaret [name] is a 51 y.o. female with a history of hepatitis B, hypothyroidism, impulse disorder, mild intellectual disability, schizophrenia presenting today for follow-up of chronic hepatitis B.  History of pneumonia and right empyema requiring chest tube in October 2022.  Appears she has had recurrent episodes of pneumonia periodically since then.  Previously followed by Dr. Ellin Saba in regards to multiple pulmonary nodules.   Liver biopsy December 2016 and chronic hepatitis B and fibrosis around central veins.  Elastography in 2016 with F2/F3 disease, ultrasound in February 2021 with no liver abnormalities.  Maintained on Entecavir 0.5 mg daily, hep B surface antigen and HBV DNA ordered in April 2023 that was not completed.  Her previous HBV DNA was undetectable in March 2022 without seroconversion given hep B surface antigen was still reactive.  Last office visit 01/06/2022.  Patient accompanied with her caregiver Margaret [name] who reported she was doing well overall.  Eating well and mostly eating anything they provide to her at the group home.  She denied any abdominal pain, nausea, vomiting, rectal bleeding, melena.  Had had a recent ED visit in July for pneumonia possibly secondary to aspiration.  She had been compliant with Entecavir 0.5 mg daily.  No red flags symptoms were present.  Never had EGD or colonoscopy.  HBV DNA and hep B surface antigen ordered and elastography ordered.  Discussed importance of colonoscopy and at minimum Cologuard they advised that they would speak to patient's legal guardian  regarding how to proceed.  Advised follow-up in 6 months.  Ultrasound elastography October 2023: -kPa 3.3 -No liver mass or hepato-biliary ductal dilation noted  Labs in November 2023 with positive hep B surface antigen. Advised to continue entecavir 0.5 mg daily.   Normal AFP and April 2024  Recent labs 09/29/2022: Albumin 3.8, AST 15, ALT 12, alk phos 120, T. bili 0.2.  WBC 15.2, hemoglobin 10.3.  Normal lipid panel and TSH.  Today: Margaret [name] is present with her today from group home and helps to provide history. She denies any issues or complaints of N/V with dairy products.   Has a BM everyday and is soft and normal, occasional smearing in underwear. No abdominal pain, jaundice, melena, brbpr, fevers, lack of appetite weight loss, reflux, dyspahgia. Sleeps well mos of the time.  Denies any extremity erythema or any jaundice.  She does wear depends and is incontinent of urine mostly.   Does report a cough but has history of aspiration pneumonia. Happens more during the fall and winter time or season change. No recent issues.   Current Outpatient Medications  Medication Sig Dispense Refill   acetaminophen (TYLENOL) 500 MG tablet Take 1 tablet (500 mg total) by mouth every 6 (six) hours as needed for moderate pain, fever or headache. 90 tablet 1   albuterol (PROVENTIL) (2.5 MG/3ML) 0.083% nebulizer solution Take 3 mLs (2.5 mg total) by nebulization every 6 (six) hours as needed for wheezing or shortness of breath. 75 mL 5   Amantadine HCl 100 MG tablet Take 100 mg by mouth every morning.     Cholecalciferol (  VITAMIN D3) 50 MCG (2000 UT) capsule Take 1 capsule (2,000 Units total) by mouth daily. 30 capsule PRN   clozapine (CLOZARIL) 200 MG tablet TAKE 2 TABLETS BY MOUTH AT BEDTIME. 60 tablet 1   entecavir (BARACLUDE) 0.5 MG tablet TAKE (1) TABLET BY MOUTH ONCE DAILY. 30 tablet 11   feeding supplement (BOOST HIGH PROTEIN) LIQD Take 1 Container by mouth 3 (three) times daily between meals. 237  mls Tid between meals.     Fish Oil-Cholecalciferol (FISH OIL-VITAMIN D) 1200-1000 MG-UNIT CAPS TAKE 1 CAPSULE BY MOUTH TWICE DAILY. 60 capsule 11   FLUoxetine (PROZAC) 20 MG capsule TAKE 3 CAPSULES BY MOUTH ONCE DAILY. 90 capsule 1   fluticasone (FLONASE) 50 MCG/ACT nasal spray SPRAY 2 SPRAYS IN RIGHT NOSTRIL ONLY TWICE DAILY. 16 g 3   fluticasone-salmeterol (ADVAIR HFA) 115-21 MCG/ACT inhaler Inhale 2 puffs into the lungs 2 (two) times daily. 1 each 5   guaiFENesin (MUCINEX) 600 MG 12 hr tablet Take 2 tablets (1,200 mg total) by mouth 2 (two) times daily. 120 tablet 2   hydrOXYzine (VISTARIL) 25 MG capsule TAKE 1 CAPSULE BY MOUTH EVERY 8 HOURS AS NEEDED FOR ANXIETY 90 capsule 11   levonorgestrel (MIRENA) 20 MCG/24HR IUD 1 each by Intrauterine route once.     loratadine (CLARITIN) 10 MG tablet Take 1 tablet (10 mg total) by mouth daily. 90 tablet 3   mirtazapine (REMERON) 15 MG tablet Take 15 mg by mouth at bedtime.     polyethylene glycol powder (GOODSENSE CLEARLAX) 17 GM/SCOOP powder Take 127.5 g by mouth 2 (two) times daily. (Patient taking differently: Take 1 Container by mouth 2 (two) times daily. 17 grams Bid)     Skin Protectants, Misc. (MINERIN CREME) CREA Apply 1 Application topically See admin instructions. Apply moderate amount topically to dry skin on soles and heels of both feet after shower     Tiotropium Bromide Monohydrate (SPIRIVA RESPIMAT) 1.25 MCG/ACT AERS Inhale 2 puffs into the lungs daily. 4 g 5   vitamin E 200 UNIT capsule Take 1 capsule (200 Units total) by mouth daily. 30 capsule 11   Gauze Pads & Dressings (STERILE GAUZE) 3"X3" PADS 2 packets by Does not apply route daily. (Patient not taking: Reported on 11/16/2022) 30 each 1   No current facility-administered medications for this visit.    Past Medical History:  Diagnosis Date   Anxiety    Depression    Hematuria 11/12/2014   Hepatitis    Hepatitis B carrier (HCC)    Hypothyroidism    Impulse disorder    IUD  (intrauterine device) in place 11/12/2014   [facility] 12/24/12 at [facility]   Liver fibrosis    Mild intellectual disability    Pneumonia 01/2022   Pulmonary nodules 01/2022   Schizophrenia (HCC)    Tuberculosis    as a child   Urinary frequency 11/12/2014    Past Surgical History:  Procedure Laterality Date   ARM WOUND REPAIR / CLOSURE     BRONCHIAL BRUSHINGS  02/02/2022   Procedure: BRONCHIAL BRUSHINGS;  Surgeon: Martina Sinner, MD;  Location: [facility] ENDOSCOPY;  Service: Pulmonary;;   BRONCHIAL WASHINGS  02/02/2022   Procedure: BRONCHIAL WASHINGS;  Surgeon: Martina Sinner, MD;  Location: [facility] ENDOSCOPY;  Service: Pulmonary;;   TRACHEAL SURGERY     TRACHEOSTOMY     removed   VIDEO BRONCHOSCOPY N/A 02/02/2022   Procedure: VIDEO BRONCHOSCOPY WITH FLUORO;  Surgeon: Martina Sinner, MD;  Location: [facility] ENDOSCOPY;  Service: Pulmonary;  Laterality: N/A;    Family History  Adopted: Yes  Family history unknown: Yes    Allergies as of 11/16/2022 - Review Complete 11/16/2022  Allergen Reaction Noted   Lactose Nausea And Vomiting 08/06/2012   Olanzapine  08/06/2012   Tuberculin, ppd  08/06/2012   Milk (cow) Nausea And Vomiting 08/06/2012    Social History   Socioeconomic History   Marital status: Single    Spouse name: Not on file   Number of children: 0   Years of education: 12   Highest education level: High school graduate  Occupational History   Occupation: disabled    Comment: due to schizophrenia  Tobacco Use   Smoking status: Never    Passive exposure: Never   Smokeless tobacco: Never  Vaping Use   Vaping status: Never Used  Substance and Sexual Activity   Alcohol use: No    Alcohol/week: 0.0 standard drinks of alcohol   Drug use: No   Sexual activity: Not Currently    Birth control/protection: I.U.D.    Comment: Mirena IUD  Other Topics Concern   Not on file  Social History Narrative   Patient was adopted. She does not have any children and has never been  married. She lives in a group home and is disabled. Has a diagnosis of schizophrenia. She socially isolates herself, doesn't like to participate in group activities, sleeps a lot, has to be prompted to bathe, eat, dress, etc. -07/22/20   Social Determinants of Health   Financial Resource Strain: Low Risk  (07/25/2022)   Overall Financial Resource Strain (CARDIA)    Difficulty of Paying Living Expenses: Not hard at all  Food Insecurity: No Food Insecurity (07/25/2022)   Hunger Vital Sign    Worried About Running Out of Food in the Last Year: Never true    Ran Out of Food in the Last Year: Never true  Transportation Needs: No Transportation Needs (07/25/2022)   PRAPARE - Administrator, Civil Service (Medical): No    Lack of Transportation (Non-Medical): No  Physical Activity: Insufficiently Active (07/25/2022)   Exercise Vital Sign    Days of Exercise per Week: 3 days    Minutes of Exercise per Session: 30 min  Stress: No Stress Concern Present (07/25/2022)   [employer] of Occupational Health - Occupational Stress Questionnaire    Feeling of Stress : Only a little  Social Connections: Socially Isolated (07/25/2022)   Social Connection and Isolation Panel [NHANES]    Frequency of Communication with Friends and Family: Never    Frequency of Social Gatherings with Friends and Family: More than three times a week    Attends Religious Services: Never    Database administrator or Organizations: No    Attends Banker Meetings: Never    Marital Status: Never married     Review of Systems   Gen: Denies fever, chills, anorexia. Denies fatigue, weakness, weight loss.  CV: Denies chest pain, palpitations, syncope, peripheral edema, and claudication. Resp: Denies dyspnea at rest, cough, wheezing, coughing up blood, and pleurisy. GI: See HPI Derm: Denies rash, itching, dry skin Psych: Denies depression, anxiety, memory loss, confusion. No homicidal or suicidal  ideation.  Heme: Denies bruising, bleeding, and enlarged lymph nodes.  Physical Exam   BP 101/66   Pulse 89   Temp 97.9 F (36.6 C)   Ht 5\' 4"  (1.626 m)   Wt 114 lb (51.7 kg)   BMI 19.57 kg/m   General:  No distress noted. Pleasant and cooperative.  No jaundice Head:  Normocephalic and atraumatic. Eyes:  Conjuctiva clear without scleral icterus. Mouth:  Oral mucosa pink and moist. Good dentition. No lesions. Lungs:  Clear to auscultation bilaterally. No wheezes, rales, or rhonchi. No distress.  Heart:  S1, S2 present without murmurs appreciated.  Abdomen:  +BS, soft, non-tender and non-distended. No rebound or guarding. No HSM or masses noted.  Rectal: deferred Msk:  Symmetrical without gross deformities. Normal posture. Extremities:  Without edema.  No evidence of erythema or jaundice.  Neurologic:  Alert and  oriented x4 Psych:  Alert and cooperative. Normal mood and affect.  Assessment  Margaret [name] is a 52 y.o. female with a history of hepatitis B, hypothyroidism, impulse disorder, mild intellectual disability, schizophrenia presenting today for follow-up.  Chronic hepatitis B: Diagnosed via liver biopsy in 2016.  Hepatitis B DNA level undetectable in March 2022. She still has not had seroconversion given hep B surface antigen still reactive as of November 2023. Ultrasound with elastography in October 2023 with low kPa (minimal fibrosis) and no evidence of liver lesion or hepatobiliary ductal dilation.  She has been compliant with Entecavir 0.5 mg daily and LFTs remain within normal limits based on her last lab work from June.  AFP within normal limits in April 2024.  Given patient's age and and is not she is at higher risk for hepatocellular carcinoma in the setting of her chronic hepatitis B therefore she is in need of ultrasound and AFP every 6 months.  When I will plan to perform prior quadrant ultrasound and obtain hep B DNA and surface antigen labs.  Will plan to continue  Entecavir 0.5 mg daily, if evidence of increased viral load may need to consider resistance testing.  Screening for colon cancer: No alarm symptoms present.  No weight loss. Spoke with Margaret [name] her caregiver from the group home who feels as though they would have better luck with colonoscopy versus Cologuard given patient likely would not notify staff when she needs to go to the bathroom to be able to collect sample.  Spoke with Lauris Poag with [employer] regarding the procedure and importance of colon cancer screening with colonoscopy versus Cologuard and she has agreed to proceed with colonoscopy therefore we will work on getting patient scheduled for first-ever screening.  PLAN   HBV DNA and Hep B surface Ag and AFP RUQ [place] now RUQ [place] and AFP every 6 months.  Continue Entecavir 0.5 mg daily Proceed with colonoscopy with propofol by Dr. Levon Hedger in near future: the risks, benefits, and alternatives have been discussed with the patient in detail. The patient states understanding and desires to proceed. ASA 3 UPT Follow up in 6 months.     Brooke Bonito, MSN, FNP-BC, AGACNP-BC [facility]  I have reviewed the note and agree with the APP's assessment as described in this progress note  Patient with evidence of F2-F3 fibrosis on previous investigations.  Given ethnicity, it is reasonable to proceed with ultrasound and AFP every 6 months.  She will continue with Entecavir daily.  No need to check CMP as she had this lab drawn less than 2 months ago which showed normal creatinine and liver function.  Katrinka Blazing, MD Gastroenterology and Hepatology [facility]

## 2022-11-16 NOTE — Telephone Encounter (Signed)
Patient seen today in the office.  After her visit I followed up with patient's legal guardian Amillia with [employer] who has agreed for patient to undergo colonoscopy.  This was discussed with caregiver at [facility] group home Vernona Rieger) during office visit.  Please schedule her for a colonoscopy in the near future Dr. Levon Hedger.  ASA 3 Appears she still has IUD in place therefore she will need urine pregnancy test prior to procedure.  No medication adjustments needed.   Brooke Bonito, MSN, APRN, FNP-BC, AGACNP-BC [facility]

## 2022-11-16 NOTE — Patient Instructions (Signed)
Continue taking Entecavir 0.5 mg daily.  Please have blood work completed at [facility].  We will call you with results once they have been received. Please allow 3-5 business days for review. 2 locations for [facility] in [place]:              1. [address], [place]              2. [address] [place]   Will also be scheduled for right upper quadrant ultrasound to further evaluate your liver.  We will discuss with legal guardian about performing colonoscopy.  We will plan to follow-up in 6 months, sooner if needed.  It was a pleasure to see you today. I want to create trusting relationships with patients. If you receive a survey regarding your visit,  I greatly appreciate you taking time to fill this out on paper or through your MyChart. I value your feedback.  Brooke Bonito, MSN, FNP-BC, AGACNP-BC [facility]

## 2022-11-18 LAB — HEPATITIS B DNA, ULTRAQUANTITATIVE, PCR: HBV DNA SERPL PCR-ACNC: NOT DETECTED [IU]/mL

## 2022-11-18 LAB — HEPATITIS B SURFACE ANTIGEN

## 2022-11-18 LAB — HEPATITIS B SURFACE AG, CONFIRM: HBsAG Confirmation: POSITIVE — AB

## 2022-11-18 LAB — AFP TUMOR MARKER: AFP, Serum, Tumor Marker: 2.4 ng/mL (ref 0.0–6.4)

## 2022-11-22 MED ORDER — PEG 3350-KCL-NA BICARB-NACL 420 G PO SOLR: 4000.0000 mL | Freq: Once | ORAL | 0 refills | Status: AC

## 2022-11-22 NOTE — Telephone Encounter (Signed)
Spoke with Vernona Rieger and [facility]. Pt scheduled for 9/10. She needs an arrival time since they do have to provide transportation. Also requested rx for prep to be sent to [facility] in [place]. Instructions also sent.

## 2022-11-22 NOTE — Addendum Note (Signed)
Addended by: Armstead Peaks on: 11/22/2022 08:03 AM   Modules accepted: Orders

## 2022-11-23 ENCOUNTER — Encounter: Payer: Self-pay | Admitting: *Deleted

## 2022-12-02 ENCOUNTER — Encounter: Payer: Self-pay | Admitting: *Deleted

## 2022-12-08 ENCOUNTER — Other Ambulatory Visit: Payer: 59

## 2022-12-08 NOTE — Patient Instructions (Addendum)
Margaret [name]  12/08/2022     @PREFPERIOPPHARMACY @   Your procedure is scheduled on  12/13/2022.   Report to [facility] at  0825  A.M.   Call this number if you have problems the morning of surgery:  [phone]  If you experience any cold or flu symptoms such as cough, fever, chills, shortness of breath, etc. between now and your scheduled surgery, please notify us at the above number.   Remember:  Follow the diet and prep instructions given to to by the office.      Use your nebulizer and your inhaler before you come and bring your rescue inhaler with you.     Take these medicines the morning of surgery with A SIP OF WATER                              entecavir, prozac, hydroxyzine     Do not wear jewelry, make-up or nail polish, including gel polish,  artificial nails, or any other type of covering on natural nails (fingers and  toes).  Do not wear lotions, powders, or perfumes, or deodorant.  Do not shave 48 hours prior to surgery.  Men may shave face and neck.  Do not bring valuables to the hospital.  [facility] is not responsible for any belongings or valuables.  Contacts, dentures or bridgework may not be worn into surgery.  Leave your suitcase in the car.  After surgery it may be brought to your room.  For patients admitted to the hospital, discharge time will be determined by your treatment team.  Patients discharged the day of surgery will not be allowed to drive home and must have someone with them for 24 hours.     Special instructions:   DO NOT smoke tobacco or vape for 24 hours before your procedure.  Please read over the following fact sheets that you were given. Anesthesia Post-op Instructions and Care and Recovery After Surgery       Colonoscopy, Adult, Care After The following information offers guidance on how to care for yourself after your procedure. Your health care provider may also give you more specific instructions. If you have  problems or questions, contact your health care provider. What can I expect after the procedure? After the procedure, it is common to have: A small amount of blood in your stool for 24 hours after the procedure. Some gas. Mild cramping or bloating of your abdomen. Follow these instructions at home: Eating and drinking  Drink enough fluid to keep your urine pale yellow. Follow instructions from your health care provider about eating or drinking restrictions. Resume your normal diet as told by your health care provider. Avoid heavy or fried foods that are hard to digest. Activity Rest as told by your health care provider. Avoid sitting for a long time without moving. Get up to take short walks every 1-2 hours. This is important to improve blood flow and breathing. Ask for help if you feel weak or unsteady. Return to your normal activities as told by your health care provider. Ask your health care provider what activities are safe for you. Managing cramping and bloating  Try walking around when you have cramps or feel bloated. If directed, apply heat to your abdomen as told by your health care provider. Use the heat source that your health care provider recommends, such as a moist heat pack or a  heating pad. Place a towel between your skin and the heat source. Leave the heat on for 20-30 minutes. Remove the heat if your skin turns bright red. This is especially important if you are unable to feel pain, heat, or cold. You have a greater risk of getting burned. General instructions If you were given a sedative during the procedure, it can affect you for several hours. Do not drive or operate machinery until your health care provider says that it is safe. For the first 24 hours after the procedure: Do not sign important documents. Do not drink alcohol. Do your regular daily activities at a slower pace than normal. Eat soft foods that are easy to digest. Take over-the-counter and prescription  medicines only as told by your health care provider. Keep all follow-up visits. This is important. Contact a health care provider if: You have blood in your stool 2-3 days after the procedure. Get help right away if: You have more than a small spotting of blood in your stool. You have large blood clots in your stool. You have swelling of your abdomen. You have nausea or vomiting. You have a fever. You have increasing pain in your abdomen that is not relieved with medicine. These symptoms may be an emergency. Get help right away. Call 911. Do not wait to see if the symptoms will go away. Do not drive yourself to the hospital. Summary After the procedure, it is common to have a small amount of blood in your stool. You may also have mild cramping and bloating of your abdomen. If you were given a sedative during the procedure, it can affect you for several hours. Do not drive or operate machinery until your health care provider says that it is safe. Get help right away if you have a lot of blood in your stool, nausea or vomiting, a fever, or increased pain in your abdomen. This information is not intended to replace advice given to you by your health care provider. Make sure you discuss any questions you have with your health care provider. Document Revised: 05/03/2022 Document Reviewed: 11/11/2020 Elsevier Patient Education  2024 Elsevier Inc. Monitored Anesthesia Care, Care After The following information offers guidance on how to care for yourself after your procedure. Your health care provider may also give you more specific instructions. If you have problems or questions, contact your health care provider. What can I expect after the procedure? After the procedure, it is common to have: Tiredness. Little or no memory about what happened during or after the procedure. Impaired judgment when it comes to making decisions. Nausea or vomiting. Some trouble with balance. Follow these  instructions at home: For the time period you were told by your health care provider:  Rest. Do not participate in activities where you could fall or become injured. Do not drive or use machinery. Do not drink alcohol. Do not take sleeping pills or medicines that cause drowsiness. Do not make important decisions or sign legal documents. Do not take care of children on your own. Medicines Take over-the-counter and prescription medicines only as told by your health care provider. If you were prescribed antibiotics, take them as told by your health care provider. Do not stop using the antibiotic even if you start to feel better. Eating and drinking Follow instructions from your health care provider about what you may eat and drink. Drink enough fluid to keep your urine pale yellow. If you vomit: Drink clear fluids slowly and in small amounts  as you are able. Clear fluids include water, ice chips, low-calorie sports drinks, and fruit juice that has water added to it (diluted fruit juice). Eat light and bland foods in small amounts as you are able. These foods include bananas, applesauce, rice, lean meats, toast, and crackers. General instructions  Have a responsible adult stay with you for the time you are told. It is important to have someone help care for you until you are awake and alert. If you have sleep apnea, surgery and some medicines can increase your risk for breathing problems. Follow instructions from your health care provider about wearing your sleep device: When you are sleeping. This includes during daytime naps. While taking prescription pain medicines, sleeping medicines, or medicines that make you drowsy. Do not use any products that contain nicotine or tobacco. These products include cigarettes, chewing tobacco, and vaping devices, such as e-cigarettes. If you need help quitting, ask your health care provider. Contact a health care provider if: You feel nauseous or vomit  every time you eat or drink. You feel light-headed. You are still sleepy or having trouble with balance after 24 hours. You get a rash. You have a fever. You have redness or swelling around the IV site. Get help right away if: You have trouble breathing. You have new confusion after you get home. These symptoms may be an emergency. Get help right away. Call 911. Do not wait to see if the symptoms will go away. Do not drive yourself to the hospital. This information is not intended to replace advice given to you by your health care provider. Make sure you discuss any questions you have with your health care provider. Document Revised: 08/16/2021 Document Reviewed: 08/16/2021 Elsevier Patient Education  2024 [employer].

## 2022-12-09 ENCOUNTER — Encounter (HOSPITAL_COMMUNITY): Payer: Self-pay

## 2022-12-09 ENCOUNTER — Encounter (HOSPITAL_COMMUNITY)
Admission: RE | Admit: 2022-12-09 | Discharge: 2022-12-09 | Disposition: A | Discharge status: Home / Self Care | Payer: 59 | Source: Ambulatory Visit | Attending: Gastroenterology | Admitting: Gastroenterology

## 2022-12-09 DIAGNOSIS — Z01812 Encounter for preprocedural laboratory examination: Secondary | ICD-10-CM | POA: Diagnosis not present

## 2022-12-09 DIAGNOSIS — Z01818 Encounter for other preprocedural examination: Secondary | ICD-10-CM | POA: Diagnosis present

## 2022-12-09 LAB — POCT PREGNANCY, URINE: Preg Test, Ur: NEGATIVE

## 2022-12-13 ENCOUNTER — Ambulatory Visit (HOSPITAL_COMMUNITY)
Admission: RE | Admit: 2022-12-13 | Discharge: 2022-12-13 | Disposition: A | Discharge status: Home / Self Care | Payer: 59 | Attending: Gastroenterology | Admitting: Gastroenterology

## 2022-12-13 ENCOUNTER — Encounter (HOSPITAL_COMMUNITY): Payer: Self-pay | Admitting: Gastroenterology

## 2022-12-13 ENCOUNTER — Ambulatory Visit (HOSPITAL_COMMUNITY): Payer: 59 | Admitting: Certified Registered"

## 2022-12-13 ENCOUNTER — Encounter (HOSPITAL_COMMUNITY)
Admission: RE | Disposition: A | Discharge status: Home / Self Care | Payer: Self-pay | Source: Home / Self Care | Attending: Gastroenterology

## 2022-12-13 DIAGNOSIS — D128 Benign neoplasm of rectum: Secondary | ICD-10-CM

## 2022-12-13 DIAGNOSIS — F419 Anxiety disorder, unspecified: Secondary | ICD-10-CM | POA: Diagnosis not present

## 2022-12-13 DIAGNOSIS — J449 Chronic obstructive pulmonary disease, unspecified: Secondary | ICD-10-CM

## 2022-12-13 DIAGNOSIS — Z8701 Personal history of pneumonia (recurrent): Secondary | ICD-10-CM | POA: Diagnosis not present

## 2022-12-13 DIAGNOSIS — D125 Benign neoplasm of sigmoid colon: Secondary | ICD-10-CM | POA: Diagnosis not present

## 2022-12-13 DIAGNOSIS — Z1211 Encounter for screening for malignant neoplasm of colon: Secondary | ICD-10-CM

## 2022-12-13 DIAGNOSIS — K6289 Other specified diseases of anus and rectum: Secondary | ICD-10-CM | POA: Insufficient documentation

## 2022-12-13 DIAGNOSIS — F32A Depression, unspecified: Secondary | ICD-10-CM | POA: Insufficient documentation

## 2022-12-13 DIAGNOSIS — K5140 Inflammatory polyps of colon without complications: Secondary | ICD-10-CM | POA: Diagnosis not present

## 2022-12-13 DIAGNOSIS — K635 Polyp of colon: Secondary | ICD-10-CM

## 2022-12-13 DIAGNOSIS — D126 Benign neoplasm of colon, unspecified: Secondary | ICD-10-CM

## 2022-12-13 DIAGNOSIS — B181 Chronic viral hepatitis B without delta-agent: Secondary | ICD-10-CM | POA: Insufficient documentation

## 2022-12-13 DIAGNOSIS — D122 Benign neoplasm of ascending colon: Secondary | ICD-10-CM | POA: Diagnosis not present

## 2022-12-13 DIAGNOSIS — R32 Unspecified urinary incontinence: Secondary | ICD-10-CM | POA: Diagnosis not present

## 2022-12-13 DIAGNOSIS — F209 Schizophrenia, unspecified: Secondary | ICD-10-CM | POA: Insufficient documentation

## 2022-12-13 HISTORY — PX: HEMOSTASIS CLIP PLACEMENT: SHX6857

## 2022-12-13 HISTORY — PX: COLONOSCOPY WITH PROPOFOL: SHX5780

## 2022-12-13 HISTORY — PX: POLYPECTOMY: SHX5525

## 2022-12-13 HISTORY — PX: SUBMUCOSAL LIFTING INJECTION: SHX6855

## 2022-12-13 HISTORY — PX: BIOPSY: SHX5522

## 2022-12-13 SURGERY — COLONOSCOPY WITH PROPOFOL
Anesthesia: General

## 2022-12-13 MED ORDER — PROPOFOL 10 MG/ML IV BOLUS
INTRAVENOUS | Status: DC | PRN
  Administered 2022-12-13 (×2): 20 mg via INTRAVENOUS
  Administered 2022-12-13: 10 mg via INTRAVENOUS
  Administered 2022-12-13 (×5): 20 mg via INTRAVENOUS
  Administered 2022-12-13: 10 mg via INTRAVENOUS
  Administered 2022-12-13 (×2): 20 mg via INTRAVENOUS
  Administered 2022-12-13: 50 mg via INTRAVENOUS
  Administered 2022-12-13 (×2): 20 mg via INTRAVENOUS
  Administered 2022-12-13: 50 mg via INTRAVENOUS
  Administered 2022-12-13 (×3): 20 mg via INTRAVENOUS

## 2022-12-13 MED ORDER — STERILE WATER FOR IRRIGATION IR SOLN
Status: DC | PRN
  Administered 2022-12-13: 180 mL

## 2022-12-13 MED ORDER — LIDOCAINE HCL (CARDIAC) PF 100 MG/5ML IV SOSY
PREFILLED_SYRINGE | INTRAVENOUS | Status: DC | PRN
  Administered 2022-12-13: 60 mg via INTRAVENOUS

## 2022-12-13 MED ORDER — LACTATED RINGERS IV SOLN: INTRAVENOUS | Status: DC | PRN

## 2022-12-13 NOTE — Transfer of Care (Signed)
Immediate Anesthesia Transfer of Care Note  Patient: Margaret [name]  Procedure(s) Performed: COLONOSCOPY WITH PROPOFOL POLYPECTOMY HEMOSTASIS CLIP PLACEMENT SUBMUCOSAL LIFTING INJECTION BIOPSY  Patient Location: PACU  Anesthesia Type:General  Level of Consciousness: awake, drowsy, and patient cooperative  Airway & Oxygen Therapy: Patient Spontanous Breathing and Patient connected to nasal cannula oxygen  Post-op Assessment: Report given to RN, Post -op Vital signs reviewed and stable, and Patient moving all extremities X 4  Post vital signs: Reviewed and stable  Last Vitals:  Vitals Value Taken Time  BP    Temp 36.4 C 12/13/22 1030  Pulse    Resp    SpO2     VSS.  Last Pain:  Vitals:   12/13/22 0924  PainSc: 0-No pain         Complications: No notable events documented.

## 2022-12-13 NOTE — Op Note (Signed)
[facility] Patient Name: Margaret [name] Procedure Date: 12/13/2022 9:12 AM MRN: [id number] Date of Birth: 1970-06-02 Attending MD: Katrinka Blazing , , [id number] CSN: [id number] Age: 52 Admit Type: Outpatient Procedure:                Colonoscopy Indications:              Screening for colorectal malignant neoplasm Providers:                Katrinka Blazing, Sheran Fava, Kristine L.                            Jessee Avers, Technician Referring MD:              Medicines:                Monitored Anesthesia Care Complications:            No immediate complications. Estimated Blood Loss:     Estimated blood loss: none. Procedure:                Pre-Anesthesia Assessment:                           - Prior to the procedure, a History and Physical                            was performed, and patient medications, allergies                            and sensitivities were reviewed. The patient's                            tolerance of previous anesthesia was reviewed.                           - The risks and benefits of the procedure and the                            sedation options and risks were discussed with the                            patient. All questions were answered and informed                            consent was obtained.                           - ASA Grade Assessment: II - A patient with mild                            systemic disease.                           After obtaining informed consent, the colonoscope                            was passed under direct vision. Throughout the  procedure, the patient's blood pressure, pulse, and                            oxygen saturations were monitored continuously. The                            [id number]-HQ190L ([id number]) scope was introduced through                            the anus and advanced to the the cecum, identified                            by appendiceal orifice and ileocecal  valve. The                            colonoscopy was performed without difficulty. The                            patient tolerated the procedure well. The quality                            of the bowel preparation was good. Scope In: 9:31:19 AM Scope Out: 10:24:45 AM Scope Withdrawal Time: 0 hours 38 minutes 22 seconds  Total Procedure Duration: 0 hours 53 minutes 26 seconds  Findings:      The perianal and digital rectal examinations were normal.      A 15 to 18 mm polyp was found in the ascending colon. The polyp was       semi-sessile. Area was successfully injected with 10 mL Eleview for a       lift polypectomy. Imaging was performed using white light and narrow       band imaging to visualize the mucosa and demarcate the polyp site after       injection for EMR purposes. The polyp was removed with a hot snare.       Resection and retrieval were complete. To prevent bleeding after the       polypectomy, two hemostatic clips were successfully placed. Clip       manufacturer: [employer]. There was no bleeding at the end of the       procedure.      A 4 mm polyp was found in the sigmoid colon. The polyp was sessile. The       polyp was removed with a cold snare. Resection and retrieval were       complete.      A 1 mm polyp was found in the rectum. The polyp was sessile. The polyp       was removed with a cold biopsy forceps. Resection and retrieval were       complete.      A 10 mm scar was found in the distal rectum. There as some polypoid       tissue around the scar, unclear if this was reactive tissue as there       were no adenomatous features. Biopsies were taken with a cold forceps       for histology. Notably, she has never had a colonoscopy in the past.      The retroflexed view of the  distal rectum and anal verge was normal and       showed no anal or rectal abnormalities. Impression:               - One 15 to 18 mm polyp in the ascending colon,                             removed with a hot snare. Resected and retrieved                            via EMR. Clips were placed. Clip manufacturer:                            [employer].                           - One 4 mm polyp in the sigmoid colon, removed with                            a cold snare. Resected and retrieved.                           - One 1 mm polyp in the rectum, removed with a cold                            biopsy forceps. Resected and retrieved.                           - Scar in the distal rectum. Biopsied.                           - The distal rectum and anal verge are normal on                            retroflexion view. Moderate Sedation:      Per Anesthesia Care Recommendation:           - Discharge patient to home (ambulatory).                           - Resume previous diet.                           - Await pathology results.                           - Repeat colonoscopy for surveillance based on                            pathology results. Possibly in 3 years if no                            abnormalities in rectal biopsies. Procedure Code(s):        --- Professional ---  16109, 59, Colonoscopy, flexible; with removal of                            tumor(s), polyp(s), or other lesion(s) by snare                            technique                           45380, 59, Colonoscopy, flexible; with biopsy,                            single or multiple                           45381, Colonoscopy, flexible; with directed                            submucosal injection(s), any substance Diagnosis Code(s):        --- Professional ---                           Z12.11, Encounter for screening for malignant                            neoplasm of colon                           D12.2, Benign neoplasm of ascending colon                           D12.5, Benign neoplasm of sigmoid colon                           D12.8, Benign neoplasm of  rectum                           K62.89, Other specified diseases of anus and rectum CPT copyright 2022 American Medical Association. All rights reserved. The codes documented in this report are preliminary and upon coder review may  be revised to meet current compliance requirements. Katrinka Blazing, MD Katrinka Blazing,  12/13/2022 10:35:40 AM This report has been signed electronically. Number of Addenda: 0

## 2022-12-13 NOTE — Discharge Instructions (Signed)
You are being discharged to home.  Resume your previous diet.  We are waiting for your pathology results.  Your physician has recommended a repeat colonoscopy for surveillance based on pathology results.  

## 2022-12-13 NOTE — Interval H&P Note (Signed)
History and Physical Interval Note:  12/13/2022 9:17 AM  Margaret [name]  has presented today for surgery, with the diagnosis of CA Screen.  The various methods of treatment have been discussed with the patient and family. After consideration of risks, benefits and other options for treatment, the patient has consented to  Procedure(s) with comments: COLONOSCOPY WITH PROPOFOL (N/A) - 10:15am, asa 3, [facility] as a surgical intervention.  The patient's history has been reviewed, patient examined, no change in status, stable for surgery.  I have reviewed the patient's chart and labs.  Questions were answered to the patient's satisfaction.     Katrinka Blazing Mayorga

## 2022-12-13 NOTE — Anesthesia Preprocedure Evaluation (Signed)
Anesthesia Evaluation  Patient identified by MRN, date of birth, ID band Patient awake    Reviewed: Allergy & Precautions, H&P , NPO status , Patient's Chart, lab work & pertinent test results, reviewed documented beta blocker date and time   Airway Mallampati: II  TM Distance: >3 FB Neck ROM: full    Dental no notable dental hx.    Pulmonary neg pulmonary ROS, pneumonia, COPD   Pulmonary exam normal breath sounds clear to auscultation       Cardiovascular Exercise Tolerance: Good negative cardio ROS  Rhythm:regular Rate:Normal     Neuro/Psych  PSYCHIATRIC DISORDERS Anxiety Depression  Schizophrenia  negative neurological ROS  negative psych ROS   GI/Hepatic negative GI ROS, Neg liver ROS,,,(+) Hepatitis -  Endo/Other  negative endocrine ROSHypothyroidism    Renal/GU negative Renal ROS  negative genitourinary   Musculoskeletal   Abdominal   Peds  Hematology negative hematology ROS (+) Blood dyscrasia, anemia   Anesthesia Other Findings   Reproductive/Obstetrics negative OB ROS                             Anesthesia Physical Anesthesia Plan  ASA: 3  Anesthesia Plan: General   Post-op Pain Management:    Induction:   PONV Risk Score and Plan: Propofol infusion  Airway Management Planned:   Additional Equipment:   Intra-op Plan:   Post-operative Plan:   Informed Consent: I have reviewed the patients History and Physical, chart, labs and discussed the procedure including the risks, benefits and alternatives for the proposed anesthesia with the patient or authorized representative who has indicated his/her understanding and acceptance.     Dental Advisory Given  Plan Discussed with: CRNA  Anesthesia Plan Comments:        Anesthesia Quick Evaluation

## 2022-12-14 LAB — SURGICAL PATHOLOGY

## 2022-12-14 NOTE — Anesthesia Postprocedure Evaluation (Signed)
Anesthesia Post Note  Patient: Margaret [name]  Procedure(s) Performed: COLONOSCOPY WITH PROPOFOL POLYPECTOMY HEMOSTASIS CLIP PLACEMENT SUBMUCOSAL LIFTING INJECTION BIOPSY  Patient location during evaluation: Phase II Anesthesia Type: General Level of consciousness: awake Pain management: pain level controlled Vital Signs Assessment: post-procedure vital signs reviewed and stable Respiratory status: spontaneous breathing and respiratory function stable Cardiovascular status: blood pressure returned to baseline and stable Postop Assessment: no headache and no apparent nausea or vomiting Anesthetic complications: no Comments: Late entry   No notable events documented.   Last Vitals:  Vitals:   12/13/22 0900 12/13/22 1030  BP: 121/71 97/67  Pulse: 77 68  Resp: (!) 25 (!) 22  Temp:  (!) 36.4 C  SpO2: 100% 100%    Last Pain:  Vitals:   12/13/22 1030  TempSrc: Axillary  PainSc: 0-No pain                 Windell Norfolk

## 2022-12-15 ENCOUNTER — Encounter (INDEPENDENT_AMBULATORY_CARE_PROVIDER_SITE_OTHER): Payer: Self-pay | Admitting: *Deleted

## 2022-12-22 ENCOUNTER — Encounter (HOSPITAL_COMMUNITY): Payer: Self-pay | Admitting: Gastroenterology

## 2022-12-23 ENCOUNTER — Telehealth: Payer: Self-pay | Admitting: Family Medicine

## 2022-12-23 NOTE — Telephone Encounter (Signed)
Same as the other patients, Mucinex and Flonase and Benadryl in the evening can help with the congestion and if anything worsens then please let us know.

## 2022-12-23 NOTE — Telephone Encounter (Signed)
Caregiver called to report to Dr Dettinger that pt tested positive for covid about an hour ago. Not showing any signs of symptoms other than a little congestion. Wants to know what Dr Dettinger advises for pt to take OTC.

## 2022-12-23 NOTE — Telephone Encounter (Signed)
Caregiver has been notified.

## 2023-01-06 DIAGNOSIS — Z79899 Other long term (current) drug therapy: Secondary | ICD-10-CM | POA: Diagnosis not present

## 2023-02-10 ENCOUNTER — Telehealth: Payer: Self-pay | Admitting: Family Medicine

## 2023-02-10 NOTE — Telephone Encounter (Unsigned)
Copied from CRM [id number]. Topic: Clinical - Medication Refill >> Feb 10, 2023  3:17 PM Tiffany H wrote: Most Recent Primary Care Visit:  Provider: [facility]  Department: [facility]  Visit Type: LAB  Date: 11/07/2022  Medication: Advair 115-21 MCG/ACT 2 puffs/2x per day  Iotropium Bromide Monohydrate (SPIRIVA RESPIMAT) 1.25 MCG/ACT AERS 2 puffs 4g   Has the patient contacted their pharmacy? Yes (Agent: If no, request that the patient contact the pharmacy for the refill. If patient does not wish to contact the pharmacy document the reason why and proceed with request.) (Agent: If yes, when and what did the pharmacy advise?) Contact physician.   Is this the correct pharmacy for this prescription? Yes If no, delete pharmacy and type the correct one.  This is the patient's preferred pharmacy:  [facility] - [place], [place] - [address] [place] [address] Phone: [phone] Fax: [phone]  [facility] [address] [place] - [address] [place] [address] Phone: [phone] Fax: [phone]   Has the prescription been filled recently? No  Is the patient out of the medication? No  Has the patient been seen for an appointment in the last year OR does the patient have an upcoming appointment? Yes  Can we respond through MyChart? No  Agent: Please be advised that Rx refills may take up to 3 business days. We ask that you follow-up with your pharmacy.

## 2023-02-13 MED ORDER — SPIRIVA RESPIMAT 1.25 MCG/ACT IN AERS: 2.0000 | INHALATION_SPRAY | Freq: Every day | RESPIRATORY_TRACT | 5 refills | Status: DC

## 2023-02-13 NOTE — Telephone Encounter (Signed)
Refills sent. Pt seen in June and has a follow up in Dec with Dr. Louanne Skye.

## 2023-03-17 DIAGNOSIS — Z79899 Other long term (current) drug therapy: Secondary | ICD-10-CM | POA: Diagnosis not present

## 2023-04-03 ENCOUNTER — Ambulatory Visit: Payer: 59 | Admitting: Family Medicine

## 2023-04-03 ENCOUNTER — Encounter: Payer: Self-pay | Admitting: Family Medicine

## 2023-04-03 VITALS — BP 100/65 | HR 100 | Temp 98.9°F | Ht 64.0 in | Wt 124.0 lb

## 2023-04-03 DIAGNOSIS — E782 Mixed hyperlipidemia: Secondary | ICD-10-CM | POA: Diagnosis not present

## 2023-04-03 DIAGNOSIS — F209 Schizophrenia, unspecified: Secondary | ICD-10-CM

## 2023-04-03 DIAGNOSIS — F79 Unspecified intellectual disabilities: Secondary | ICD-10-CM | POA: Diagnosis not present

## 2023-04-03 DIAGNOSIS — I951 Orthostatic hypotension: Secondary | ICD-10-CM | POA: Diagnosis not present

## 2023-04-03 LAB — LIPID PANEL

## 2023-04-03 MED ORDER — FISH OIL-VITAMIN D 1200-1000 MG-UNIT PO CAPS: 1.0000 | ORAL_CAPSULE | Freq: Two times a day (BID) | ORAL | 11 refills | Status: DC

## 2023-04-03 MED ORDER — VITAMIN D3 50 MCG (2000 UT) PO CAPS: 2000.0000 [IU] | ORAL_CAPSULE | Freq: Every day | ORAL | 99 refills | Status: DC

## 2023-04-03 NOTE — Progress Notes (Signed)
BP 100/65   Pulse 100   Temp 98.9 F (37.2 C)   Ht 5\' 4"  (1.626 m)   Wt 124 lb (56.2 kg)   SpO2 94%   BMI 21.28 kg/m    Subjective:   [name], female    DOB: 08/03/1970, 52 y.o.   MRN: [id number]  HPI: Margaret [name] is a 52 y.o. female presenting on 04/03/2023 for Medical Management of Chronic Issues   HPI Patient lives at [name] and has a long-term intellectual disability  Hyperlipidemia Patient is coming in for recheck of his hyperlipidemia. The patient is currently taking fish oils. They deny any issues with myalgias or history of liver damage from it. They deny any focal numbness or weakness or chest pain.   Orthostatic hypotension Patient has had issues with orthostatic hypotension.  Her blood pressure does run on the lower side.  They deny her having any issues currently with lightheadedness or dizziness and think she is doing well and are encouraging increased hydration.  Schizophrenia Patient sees psychiatry for schizophrenia  Relevant past medical, surgical, family and social history reviewed and updated as indicated. Interim medical history since our last visit reviewed. Allergies and medications reviewed and updated.  Review of Systems  Constitutional:  Negative for chills and fever.  HENT:  Negative for congestion, ear discharge and ear pain.   Eyes:  Negative for visual disturbance.  Respiratory:  Negative for chest tightness and shortness of breath.   Cardiovascular:  Negative for chest pain and leg swelling.  Genitourinary:  Negative for difficulty urinating and dysuria.  Musculoskeletal:  Negative for back pain and gait problem.  Skin:  Negative for rash.  Neurological:  Negative for dizziness, light-headedness and headaches.  Psychiatric/Behavioral:  Negative for agitation and behavioral problems.   All other systems reviewed and are negative.   Per HPI unless specifically indicated above   Allergies as of 04/03/2023        Reactions   Lactose Nausea And Vomiting   GI upset   Olanzapine    Other reaction(s): UNKNOWN   Tuberculin, Ppd    Other reaction(s): UNKNOWN   Milk (cow) Nausea And Vomiting   Other reaction(s): GI upset        Medication List        Accurate as of April 03, 2023  2:39 PM. If you have any questions, ask your nurse or doctor.          acetaminophen 500 MG tablet Commonly known as: TYLENOL Take 1 tablet (500 mg total) by mouth every 6 (six) hours as needed for moderate pain, fever or headache.   albuterol (2.5 MG/3ML) 0.083% nebulizer solution Commonly known as: PROVENTIL Take 3 mLs (2.5 mg total) by nebulization every 6 (six) hours as needed for wheezing or shortness of breath.   Amantadine HCl 100 MG tablet Take 100 mg by mouth every morning.   clozapine 200 MG tablet Commonly known as: CLOZARIL TAKE 2 TABLETS BY MOUTH AT BEDTIME.   entecavir 0.5 MG tablet Commonly known as: BARACLUDE TAKE (1) TABLET BY MOUTH ONCE DAILY.   feeding supplement Liqd Take 1 Container by mouth 3 (three) times daily between meals. 237 mls Tid between meals.   Fish Oil-Vitamin D 1200-1000 MG-UNIT Caps Take 1 capsule by mouth 2 (two) times daily.   fluticasone 50 MCG/ACT nasal spray Commonly known as: FLONASE SPRAY 2 SPRAYS IN RIGHT NOSTRIL ONLY TWICE DAILY.   fluticasone-salmeterol 115-21 MCG/ACT inhaler Commonly known as:  Advair HFA Inhale 2 puffs into the lungs 2 (two) times daily.   guaiFENesin 600 MG 12 hr tablet Commonly known as: Mucinex Take 2 tablets (1,200 mg total) by mouth 2 (two) times daily.   hydrOXYzine 25 MG capsule Commonly known as: VISTARIL TAKE 1 CAPSULE BY MOUTH EVERY 8 HOURS AS NEEDED FOR ANXIETY   levonorgestrel 20 MCG/24HR IUD Commonly known as: MIRENA 1 each by Intrauterine route once.   loratadine 10 MG tablet Commonly known as: Claritin Take 1 tablet (10 mg total) by mouth daily.   Minerin Creme Crea Apply 1 Application topically  See admin instructions. Apply moderate amount topically to dry skin on soles and heels of both feet after shower   mirtazapine 15 MG tablet Commonly known as: REMERON Take 15 mg by mouth at bedtime.   polyethylene glycol powder 17 GM/SCOOP powder Commonly known as: GoodSense ClearLax Take 127.5 g by mouth 2 (two) times daily. What changed:  how much to take additional instructions   PROzac 40 MG capsule Generic drug: FLUoxetine Take by mouth. What changed: Another medication with the same name was removed. Continue taking this medication, and follow the directions you see here. Changed by: Elige Radon Bitha Fauteux   Spiriva Respimat 1.25 MCG/ACT Aers Generic drug: Tiotropium Bromide Monohydrate Inhale 2 puffs into the lungs daily.   Sterile Gauze 3"X3" Pads 2 packets by Does not apply route daily.   Vitamin D3 50 MCG (2000 UT) capsule Take 1 capsule (2,000 Units total) by mouth daily.   vitamin E 200 UNIT capsule Take 1 capsule (200 Units total) by mouth daily.         Objective:   BP 100/65   Pulse 100   Temp 98.9 F (37.2 C)   Ht 5\' 4"  (1.626 m)   Wt 124 lb (56.2 kg)   SpO2 94%   BMI 21.28 kg/m   Wt Readings from Last 3 Encounters:  04/03/23 124 lb (56.2 kg)  12/09/22 (P) 113 lb 15.7 oz (51.7 kg)  11/16/22 114 lb (51.7 kg)    Physical Exam Vitals and nursing note reviewed.  Constitutional:      General: She is not in acute distress.    Appearance: She is well-developed. She is not diaphoretic.  Eyes:     Conjunctiva/sclera: Conjunctivae normal.  Cardiovascular:     Rate and Rhythm: Normal rate and regular rhythm.     Heart sounds: Normal heart sounds. No murmur heard. Pulmonary:     Effort: Pulmonary effort is normal. No respiratory distress.     Breath sounds: Normal breath sounds. No wheezing.  Musculoskeletal:        General: No tenderness. Normal range of motion.  Skin:    General: Skin is warm and dry.     Findings: No rash.  Neurological:      Mental Status: She is alert and oriented to person, place, and time.     Coordination: Coordination normal.  Psychiatric:        Behavior: Behavior normal.       Assessment & Plan:   Problem List Items Addressed This Visit       Cardiovascular and Mediastinum   Orthostatic hypotension   Relevant Orders   TSH     Other   Hyperlipidemia - Primary   Relevant Orders   CBC with Differential/Platelet   CMP14+EGFR   Lipid panel   TSH   Schizophrenia (HCC)   Relevant Orders   TSH   Intellectual disability  Continue current medicine, seems to be doing well, sent refills as needed and will do blood work today. Follow up plan: Return in about 6 months (around 10/02/2023), or if symptoms worsen or fail to improve, for Physical exam and hyperlipidemia.  Counseling provided for all of the vaccine components Orders Placed This Encounter  Procedures   CBC with Differential/Platelet   CMP14+EGFR   Lipid panel   TSH    Arville Care, MD [facility] 04/03/2023, 2:39 PM

## 2023-04-04 LAB — CMP14+EGFR
ALT: 11 IU/L (ref 0–32)
AST: 14 IU/L (ref 0–40)
Albumin: 4 g/dL (ref 3.8–4.9)
Alkaline Phosphatase: 132 IU/L — ABNORMAL HIGH (ref 44–121)
BUN/Creatinine Ratio: 19 (ref 9–23)
BUN: 14 mg/dL (ref 6–24)
CO2: 22 mmol/L (ref 20–29)
Calcium: 9.1 mg/dL (ref 8.7–10.2)
Chloride: 106 mmol/L (ref 96–106)
Creatinine, Ser: 0.73 mg/dL (ref 0.57–1.00)
Globulin, Total: 3.1 g/dL (ref 1.5–4.5)
Glucose: 68 mg/dL — ABNORMAL LOW (ref 70–99)
Potassium: 4.2 mmol/L (ref 3.5–5.2)
Sodium: 143 mmol/L (ref 134–144)
Total Protein: 7.1 g/dL (ref 6.0–8.5)
eGFR: 100 mL/min/{1.73_m2} (ref >59)

## 2023-04-04 LAB — CBC WITH DIFFERENTIAL/PLATELET
Basophils Absolute: 0.1 10*3/uL (ref 0.0–0.2)
Basos: 1 %
EOS (ABSOLUTE): 0.3 10*3/uL (ref 0.0–0.4)
Eos: 2 %
Hematocrit: 35.5 % (ref 34.0–46.6)
Hemoglobin: 11.9 g/dL (ref 11.1–15.9)
Immature Grans (Abs): 0 10*3/uL (ref 0.0–0.1)
Immature Granulocytes: 0 %
Lymphocytes Absolute: 2.1 10*3/uL (ref 0.7–3.1)
Lymphs: 15 %
MCH: 28.8 pg (ref 26.6–33.0)
MCHC: 33.5 g/dL (ref 31.5–35.7)
MCV: 86 fL (ref 79–97)
Monocytes Absolute: 0.6 10*3/uL (ref 0.1–0.9)
Monocytes: 5 %
Neutrophils Absolute: 11.1 10*3/uL — ABNORMAL HIGH (ref 1.4–7.0)
Neutrophils: 77 %
Platelets: 321 10*3/uL (ref 150–450)
RBC: 4.13 x10E6/uL (ref 3.77–5.28)
RDW: 11.9 % (ref 11.7–15.4)
WBC: 14.3 10*3/uL — ABNORMAL HIGH (ref 3.4–10.8)

## 2023-04-04 LAB — LIPID PANEL
Cholesterol, Total: 134 mg/dL (ref 100–199)
HDL: 49 mg/dL (ref >39)
LDL CALC COMMENT:: 2.7 ratio (ref 0.0–4.4)
LDL Chol Calc (NIH): 60 mg/dL (ref 0–99)
Triglycerides: 147 mg/dL (ref 0–149)
VLDL Cholesterol Cal: 25 mg/dL (ref 5–40)

## 2023-04-04 LAB — TSH: TSH: 1.41 u[IU]/mL (ref 0.450–4.500)

## 2023-04-13 DIAGNOSIS — Z79899 Other long term (current) drug therapy: Secondary | ICD-10-CM | POA: Diagnosis not present

## 2023-04-20 ENCOUNTER — Other Ambulatory Visit: Payer: Self-pay | Admitting: Family Medicine

## 2023-04-20 ENCOUNTER — Telehealth: Payer: Self-pay

## 2023-04-20 ENCOUNTER — Other Ambulatory Visit (HOSPITAL_COMMUNITY): Payer: Self-pay

## 2023-04-20 DIAGNOSIS — J432 Centrilobular emphysema: Secondary | ICD-10-CM

## 2023-04-20 MED ORDER — FLUTICASONE-SALMETEROL 250-50 MCG/ACT IN AEPB: 1.0000 | INHALATION_SPRAY | Freq: Two times a day (BID) | RESPIRATORY_TRACT | 3 refills | Status: DC

## 2023-04-20 NOTE — Progress Notes (Unsigned)
Sent Wixela in for the patient because the Advair was not covered

## 2023-04-20 NOTE — Telephone Encounter (Signed)
Pharmacy Patient Advocate Encounter  Received notification from [facility] that Prior Authorization for Advair [facility] 115/21 has been DENIED.  See denial reason below. No denial letter attached in CMM. Will attach denial letter to Media tab once received.   *The requested drug is not on the patients formulary

## 2023-04-21 ENCOUNTER — Encounter: Payer: Self-pay | Admitting: *Deleted

## 2023-05-08 ENCOUNTER — Other Ambulatory Visit: Payer: Self-pay | Admitting: Family Medicine

## 2023-05-08 ENCOUNTER — Telehealth: Payer: Self-pay | Admitting: Family Medicine

## 2023-05-08 NOTE — Telephone Encounter (Signed)
Left message making Vernona Rieger aware that they will have to purchase the medications OTC. They may ask the pharmacist on the correct strength since the pt takes Vit D and Fish oil combined.

## 2023-05-09 NOTE — Telephone Encounter (Signed)
Pt was on a combo Omega 3 and Vit D, but now needs a script for separate products. D/T living in a facility these have to have a script, can not be OTC meds.

## 2023-05-10 MED ORDER — VITAMIN D3 50 MCG (2000 UT) PO CAPS: 2000.0000 [IU] | ORAL_CAPSULE | Freq: Every day | ORAL | 5 refills | Status: DC

## 2023-05-11 DIAGNOSIS — Z79899 Other long term (current) drug therapy: Secondary | ICD-10-CM | POA: Diagnosis not present

## 2023-05-26 ENCOUNTER — Telehealth: Payer: Self-pay | Admitting: Family Medicine

## 2023-05-26 NOTE — Telephone Encounter (Signed)
Margaret [name] from [facility] came by the office to let Dr Dettinger know that per Link Snuffer at [facility], Dr Dettinger has to d/c the fish oil and vitamin d combo since they can't get that anymore, that way they can fill the separate Rxs for fish oil and vitamin d3.   Margaret [name] also says that her FL2 will be expiring soon and needs Dr Dettinger to fill out another one for her. Says she usually just lets Dr Dettinger know about this and drops off paperwork if needed and he fills it out.   Please advise on both of these issues and call Margaret [name].  [phone]

## 2023-05-26 NOTE — Telephone Encounter (Signed)
FYI Dettinger nurse is aware

## 2023-05-26 NOTE — Telephone Encounter (Signed)
Copied from CRM [id number]. Topic: General - Other >> May 26, 2023  1:12 PM Dennison Nancy wrote: Reason for CRM: Vernona Rieger from [facility]  called regarding patient will be stopping at office , to speak to [facility] Dettinger's nurse

## 2023-05-31 MED ORDER — VITAMIN D3 50 MCG (2000 UT) PO CAPS: 2000.0000 [IU] | ORAL_CAPSULE | Freq: Every day | ORAL | 5 refills | Status: DC

## 2023-05-31 MED ORDER — FISH OIL 1200 MG PO CAPS: 1200.0000 mg | ORAL_CAPSULE | Freq: Two times a day (BID) | ORAL | 3 refills | Status: AC

## 2023-05-31 NOTE — Telephone Encounter (Signed)
 I have already signed the paperwork and that should be back on its way.  I have sent in a prescription for separate fish oils and vitamin D for the patient

## 2023-06-01 NOTE — Telephone Encounter (Signed)
 Vernona Rieger will bring these forms back to [place] with corrections to be resigned, will need to [facility] the prescription dose on Vit D keeping the 2000 u Vit D only.

## 2023-06-02 ENCOUNTER — Telehealth: Payer: Self-pay

## 2023-06-02 NOTE — Telephone Encounter (Signed)
 The FL2s of all been signed, I signed them sometime last week, so they should be available, I am assuming that Olegario Messier or somebody upfront must have him

## 2023-06-02 NOTE — Telephone Encounter (Addendum)
 [facility] page ready, will fax to [facility] at [facility] at [phone], original at front desk

## 2023-06-02 NOTE — Telephone Encounter (Signed)
 Copied from CRM [id number]. Topic: Medical Record Request - Other >> Jun 02, 2023  1:45 PM Gery Pray wrote: Reason for CRM: Mayra Reel. From [facility] calling in regards to [facility] forms. She is wanting confirm if provider has sined them and if they are ready for pick up. Callback number [phone]

## 2023-06-13 DIAGNOSIS — Z79899 Other long term (current) drug therapy: Secondary | ICD-10-CM | POA: Diagnosis not present

## 2023-07-10 DIAGNOSIS — Z79899 Other long term (current) drug therapy: Secondary | ICD-10-CM | POA: Diagnosis not present

## 2023-08-08 DIAGNOSIS — Z79899 Other long term (current) drug therapy: Secondary | ICD-10-CM | POA: Diagnosis not present

## 2023-08-14 ENCOUNTER — Telehealth: Payer: Self-pay | Admitting: *Deleted

## 2023-08-14 MED ORDER — BUDESONIDE-FORMOTEROL FUMARATE 160-4.5 MCG/ACT IN AERO: 2.0000 | INHALATION_SPRAY | Freq: Two times a day (BID) | RESPIRATORY_TRACT | 3 refills | Status: DC

## 2023-08-14 NOTE — Telephone Encounter (Signed)
 Ax from [facility] RE: Advair Alternative requested Product not covered by brand or generic Will cover Symbicort, Breo or Dulera Please advise

## 2023-08-14 NOTE — Telephone Encounter (Signed)
 I sent Symbicort for the patient because her insurance says the Advair is not covered.

## 2023-08-14 NOTE — Addendum Note (Signed)
 Addended by: Jolyne Needs on: 08/14/2023 02:07 PM   Modules accepted: Orders

## 2023-08-15 NOTE — Telephone Encounter (Signed)
 Caregiver, Rice Chamorro made aware. Will come by and pick up documentation that Advair has been changed to Symbicort due to insurance no longer covering Advair.

## 2023-09-12 DIAGNOSIS — Z79899 Other long term (current) drug therapy: Secondary | ICD-10-CM | POA: Diagnosis not present

## 2023-09-18 ENCOUNTER — Other Ambulatory Visit: Payer: Self-pay | Admitting: Family Medicine

## 2023-09-18 DIAGNOSIS — Z1231 Encounter for screening mammogram for malignant neoplasm of breast: Secondary | ICD-10-CM

## 2023-09-21 ENCOUNTER — Ambulatory Visit
Admission: RE | Admit: 2023-09-21 | Discharge: 2023-09-21 | Disposition: A | Discharge status: Home / Self Care | Source: Ambulatory Visit | Attending: Family Medicine | Admitting: Family Medicine

## 2023-09-21 DIAGNOSIS — Z1231 Encounter for screening mammogram for malignant neoplasm of breast: Secondary | ICD-10-CM | POA: Diagnosis not present

## 2023-09-22 ENCOUNTER — Telehealth: Payer: Self-pay

## 2023-09-22 NOTE — Telephone Encounter (Signed)
 Patient is almost 3 years out from her last Pap smear so she would be due for a Pap smear after December 28 of this year.  As for the Mirena , they have extended the length that they are good for to 8 years and so she is good until December 2028 unless she starts having breakthrough bleeding before then, then we would leave it in until that time

## 2023-09-22 NOTE — Telephone Encounter (Signed)
 Copied from CRM [id number]. Topic: Clinical - Medical Advice >> Sep 22, 2023 10:39 AM Elle L wrote: Reason for CRM: Rice Chamorro with [facility] called on behalf of the patient to see when it is time to have the patient's Mirena  taken out and when it is time for her pap smear. Their call back number is [phone].

## 2023-09-25 NOTE — Telephone Encounter (Signed)
 [name] made aware. CPE with Pap scheduled 12/28 at 9:40am.

## 2023-09-29 ENCOUNTER — Other Ambulatory Visit: Payer: Self-pay | Admitting: Family Medicine

## 2023-10-02 ENCOUNTER — Encounter: Payer: 59 | Admitting: Family Medicine

## 2023-10-04 ENCOUNTER — Encounter: Payer: 59 | Admitting: Family Medicine

## 2023-10-04 DIAGNOSIS — Z79899 Other long term (current) drug therapy: Secondary | ICD-10-CM | POA: Diagnosis not present

## 2023-10-05 ENCOUNTER — Encounter: Payer: Self-pay | Admitting: Family Medicine

## 2023-10-12 ENCOUNTER — Ambulatory Visit (INDEPENDENT_AMBULATORY_CARE_PROVIDER_SITE_OTHER): Admitting: Family Medicine

## 2023-10-12 ENCOUNTER — Encounter: Payer: Self-pay | Admitting: Family Medicine

## 2023-10-12 VITALS — BP 105/70 | HR 92 | Temp 97.7°F | Ht 64.0 in | Wt 123.0 lb

## 2023-10-12 DIAGNOSIS — Z0001 Encounter for general adult medical examination with abnormal findings: Secondary | ICD-10-CM | POA: Diagnosis not present

## 2023-10-12 DIAGNOSIS — Z0000 Encounter for general adult medical examination without abnormal findings: Secondary | ICD-10-CM

## 2023-10-12 DIAGNOSIS — F209 Schizophrenia, unspecified: Secondary | ICD-10-CM | POA: Diagnosis not present

## 2023-10-12 DIAGNOSIS — E782 Mixed hyperlipidemia: Secondary | ICD-10-CM | POA: Diagnosis not present

## 2023-10-12 DIAGNOSIS — J432 Centrilobular emphysema: Secondary | ICD-10-CM | POA: Diagnosis not present

## 2023-10-12 NOTE — Progress Notes (Signed)
 BP 105/70   Pulse 92   Temp 97.7 F (36.5 C)   Ht 5' 4 (1.626 m)   Wt 123 lb (55.8 kg)   SpO2 94%   BMI 21.11 kg/m    Subjective:   [name], female    DOB: 1970/10/13, 53 y.o.   MRN: [id number]  HPI: Margaret [name] is a 53 y.o. female presenting on 10/12/2023 for Medical Management of Chronic Issues and Hyperlipidemia   HPI Physical exam Patient denies any chest pain, shortness of breath, headaches or vision issues, abdominal complaints, diarrhea, nausea, vomiting, or joint issues.   Patient has schizophrenia and sees psychiatry and things have been stable there.  Hyperlipidemia Patient is coming in for recheck of his hyperlipidemia. The patient is currently taking fish oils. They deny any issues with myalgias or history of liver damage from it. They deny any focal numbness or weakness or chest pain.   COPD Patient is coming in for COPD recheck today.  He is currently on Symbicort  and albuterol .  He has a mild chronic cough but denies any major coughing spells or wheezing spells.  He has 0 nighttime symptoms per week and 0 daytime symptoms per week currently.   Relevant past medical, surgical, family and social history reviewed and updated as indicated. Interim medical history since our last visit reviewed. Allergies and medications reviewed and updated.  Review of Systems  Constitutional:  Negative for chills and fever.  HENT:  Negative for congestion, ear discharge, ear pain and tinnitus.   Eyes:  Negative for pain, redness and visual disturbance.  Respiratory:  Negative for cough, chest tightness, shortness of breath and wheezing.   Cardiovascular:  Negative for chest pain, palpitations and leg swelling.  Gastrointestinal:  Negative for abdominal pain, blood in stool, constipation and diarrhea.  Genitourinary:  Negative for difficulty urinating, dysuria and hematuria.  Musculoskeletal:  Negative for back pain, gait problem and myalgias.  Skin:  Negative  for rash.  Neurological:  Negative for dizziness, weakness, light-headedness and headaches.  Psychiatric/Behavioral:  Negative for agitation, behavioral problems and suicidal ideas.   All other systems reviewed and are negative.   Per HPI unless specifically indicated above   Allergies as of 10/12/2023       Reactions   Lactose Nausea And Vomiting   GI upset   Olanzapine    Other reaction(s): UNKNOWN   Tuberculin, Ppd    Other reaction(s): UNKNOWN   Milk (cow) Nausea And Vomiting   Other reaction(s): GI upset        Medication List        Accurate as of October 12, 2023  3:41 PM. If you have any questions, ask your nurse or doctor.          acetaminophen  500 MG tablet Commonly known as: TYLENOL  Take 1 tablet (500 mg total) by mouth every 6 (six) hours as needed for moderate pain, fever or headache.   albuterol  (2.5 MG/3ML) 0.083% nebulizer solution Commonly known as: PROVENTIL  Take 3 mLs (2.5 mg total) by nebulization every 6 (six) hours as needed for wheezing or shortness of breath.   Amantadine  HCl 100 MG tablet Take 100 mg by mouth every morning.   budesonide -formoterol  160-4.5 MCG/ACT inhaler Commonly known as: SYMBICORT  Inhale 2 puffs into the lungs 2 (two) times daily.   clozapine  200 MG tablet Commonly known as: CLOZARIL  TAKE 2 TABLETS BY MOUTH AT BEDTIME.   entecavir  0.5 MG tablet Commonly known as: BARACLUDE  TAKE (1) TABLET  BY MOUTH ONCE DAILY.   feeding supplement Liqd Take 1 Container by mouth 3 (three) times daily between meals. 237 mls Tid between meals.   Fish Oil  1200 MG Caps Take 1 capsule (1,200 mg total) by mouth 2 (two) times daily.   fluticasone  50 MCG/ACT nasal spray Commonly known as: FLONASE  SPRAY 2 SPRAYS IN RIGHT NOSTRIL ONLY TWICE DAILY.   guaiFENesin  600 MG 12 hr tablet Commonly known as: Mucinex  Take 2 tablets (1,200 mg total) by mouth 2 (two) times daily.   hydrOXYzine  25 MG capsule Commonly known as: VISTARIL  TAKE 1  CAPSULE BY MOUTH EVERY 8 HOURS AS NEEDED FOR ANXIETY   levonorgestrel  20 MCG/24HR IUD Commonly known as: MIRENA  1 each by Intrauterine route once.   loratadine  10 MG tablet Commonly known as: Claritin  Take 1 tablet (10 mg total) by mouth daily.   Minerin Creme Crea Apply 1 Application topically See admin instructions. Apply moderate amount topically to dry skin on soles and heels of both feet after shower   mirtazapine  15 MG tablet Commonly known as: REMERON  Take 15 mg by mouth at bedtime.   polyethylene glycol powder 17 GM/SCOOP powder Commonly known as: GoodSense ClearLax Take 127.5 g by mouth 2 (two) times daily. What changed:  how much to take additional instructions   PROzac  40 MG capsule Generic drug: FLUoxetine  Take by mouth.   Spiriva  Respimat 1.25 MCG/ACT Aers Generic drug: Tiotropium Bromide Monohydrate  USE 2 PUFFS BY MOUTH ONCE DAILY   Sterile Gauze 3X3 Pads 2 packets by Does not apply route daily.   Vitamin D3 50 MCG (2000 UT) capsule Take 1 capsule (2,000 Units total) by mouth daily.   vitamin E  200 UNIT capsule Take 1 capsule (200 Units total) by mouth daily.         Objective:   BP 105/70   Pulse 92   Temp 97.7 F (36.5 C)   Ht 5' 4 (1.626 m)   Wt 123 lb (55.8 kg)   SpO2 94%   BMI 21.11 kg/m   Wt Readings from Last 3 Encounters:  10/12/23 123 lb (55.8 kg)  04/03/23 124 lb (56.2 kg)  12/09/22 (P) 113 lb 15.7 oz (51.7 kg)    Physical Exam Vitals and nursing note reviewed.  Constitutional:      General: She is not in acute distress.    Appearance: She is well-developed. She is not diaphoretic.  HENT:     Right Ear: Tympanic membrane and ear canal normal. There is no impacted cerumen.     Left Ear: Tympanic membrane and ear canal normal. There is no impacted cerumen.  Eyes:     Conjunctiva/sclera: Conjunctivae normal.  Cardiovascular:     Rate and Rhythm: Normal rate and regular rhythm.     Heart sounds: Normal heart sounds. No  murmur heard. Pulmonary:     Effort: Pulmonary effort is normal. No respiratory distress.     Breath sounds: Normal breath sounds. No wheezing.  Abdominal:     General: Abdomen is flat. Bowel sounds are normal. There is no distension.     Palpations: Abdomen is soft.     Tenderness: There is no abdominal tenderness. There is no guarding or rebound.  Musculoskeletal:        General: No swelling or tenderness. Normal range of motion.  Skin:    General: Skin is warm and dry.     Findings: No rash.  Neurological:     Mental Status: She is alert and oriented to  person, place, and time.     Coordination: Coordination normal.  Psychiatric:        Behavior: Behavior normal.       Assessment & Plan:   Problem List Items Addressed This Visit       Respiratory   Centrilobular emphysema (HCC)   Relevant Orders   CBC with Differential/Platelet   CMP14+EGFR   Lipid panel     Other   Hyperlipidemia - Primary   Relevant Orders   CBC with Differential/Platelet   CMP14+EGFR   Lipid panel   Schizophrenia (HCC)   Relevant Orders   CBC with Differential/Platelet   CMP14+EGFR   Lipid panel   Other Visit Diagnoses       Physical exam         Seems to be doing well, will check blood work today.  No changes in medication.  Follow up plan: Return in about 7 months (around 05/14/2024), or if symptoms worsen or fail to improve, for Recheck cholesterol and COPD.  Counseling provided for all of the vaccine components Orders Placed This Encounter  Procedures   CBC with Differential/Platelet   CMP14+EGFR   Lipid panel    Fonda Levins, MD [facility] 10/12/2023, 3:41 PM

## 2023-10-13 LAB — CBC WITH DIFFERENTIAL/PLATELET
Basophils Absolute: 0.1 x10E3/uL (ref 0.0–0.2)
Basos: 1 %
EOS (ABSOLUTE): 0.3 x10E3/uL (ref 0.0–0.4)
Eos: 2 %
Hematocrit: 37.3 % (ref 34.0–46.6)
Hemoglobin: 12 g/dL (ref 11.1–15.9)
Immature Grans (Abs): 0 x10E3/uL (ref 0.0–0.1)
Immature Granulocytes: 0 %
Lymphocytes Absolute: 2.5 x10E3/uL (ref 0.7–3.1)
Lymphs: 21 %
MCH: 28 pg (ref 26.6–33.0)
MCHC: 32.2 g/dL (ref 31.5–35.7)
MCV: 87 fL (ref 79–97)
Monocytes Absolute: 0.7 x10E3/uL (ref 0.1–0.9)
Monocytes: 6 %
Neutrophils Absolute: 8.4 x10E3/uL — ABNORMAL HIGH (ref 1.4–7.0)
Neutrophils: 70 %
Platelets: 314 x10E3/uL (ref 150–450)
RBC: 4.29 x10E6/uL (ref 3.77–5.28)
RDW: 12.8 % (ref 11.7–15.4)
WBC: 12 x10E3/uL — ABNORMAL HIGH (ref 3.4–10.8)

## 2023-10-13 LAB — CMP14+EGFR
ALT: 12 IU/L (ref 0–32)
AST: 16 IU/L (ref 0–40)
Albumin: 4 g/dL (ref 3.8–4.9)
Alkaline Phosphatase: 119 IU/L (ref 44–121)
BUN/Creatinine Ratio: 23 (ref 9–23)
BUN: 15 mg/dL (ref 6–24)
Bilirubin Total: <0.2 mg/dL (ref 0.0–1.2)
CO2: 23 mmol/L (ref 20–29)
Calcium: 8.9 mg/dL (ref 8.7–10.2)
Chloride: 103 mmol/L (ref 96–106)
Creatinine, Ser: 0.66 mg/dL (ref 0.57–1.00)
Globulin, Total: 3.2 g/dL (ref 1.5–4.5)
Glucose: 84 mg/dL (ref 70–99)
Potassium: 4.4 mmol/L (ref 3.5–5.2)
Sodium: 140 mmol/L (ref 134–144)
Total Protein: 7.2 g/dL (ref 6.0–8.5)
eGFR: 106 mL/min/1.73 (ref >59)

## 2023-10-13 LAB — LIPID PANEL
Chol/HDL Ratio: 2.5 ratio (ref 0.0–4.4)
Cholesterol, Total: 147 mg/dL (ref 100–199)
HDL: 59 mg/dL (ref >39)
LDL Chol Calc (NIH): 72 mg/dL (ref 0–99)
Triglycerides: 83 mg/dL (ref 0–149)
VLDL Cholesterol Cal: 16 mg/dL (ref 5–40)

## 2023-10-16 NOTE — Progress Notes (Unsigned)
 GI Office Note    Referring Provider: Dettinger, Fonda LABOR, MD Primary Care Physician:  Dettinger, Fonda LABOR, MD Primary Gastroenterologist: Toribio Rubins, MD  Date:  10/18/2023  ID:  Teliyah Royal, DOB 12-Oct-1970, MRN [id number]  Chief Complaint   Chief Complaint  Patient presents with   Follow-up    Pt arrives for follow up. Pt doing well. No issues at this time.    History of Present Illness  Anecia Nusbaum is a 53 y.o. female with a history of chronic otitis media, hypothyroidism, impulse disorder, mild intellectual disability, and schizophrenia , chronic hepatitis B e Ag + with F2/F3 fibrosis, presenting today for hepatitis B follow up.  History of pneumonia and right empyema requiring chest tube in October 2022.  Appears she has had recurrent episodes of pneumonia periodically since then.  Previously followed by Dr. Katragadda in regards to multiple pulmonary nodules.    Liver biopsy December 2016 and chronic hepatitis B and fibrosis around central veins.  Elastography in 2016 with F2/F3 disease, ultrasound in February 2021 with no liver abnormalities.  Maintained on Entecavir  0.5 mg daily, hep B surface antigen and HBV DNA ordered in April 2023 that was not completed.  Her previous HBV DNA was undetectable in March 2022 without seroconversion given hep B surface antigen was still reactive.   OV 01/06/2022.  Patient accompanied with her caregiver Katheryn who reported she was doing well overall.  Eating well and mostly eating anything they provide to her at the group home.  She denied any abdominal pain, nausea, vomiting, rectal bleeding, melena.  Had had a recent ED visit in July for pneumonia possibly secondary to aspiration.  She had been compliant with Entecavir  0.5 mg daily.  No red flags symptoms were present.  Never had EGD or colonoscopy.  HBV DNA and hep B surface antigen ordered and elastography ordered.  Discussed importance of colonoscopy and at minimum Cologuard they  advised that they would speak to patient's legal guardian regarding how to proceed.  Advised follow-up in 6 months.   Ultrasound Elastography October 2023: -kPa 3.3 -No liver mass or hepato-biliary ductal dilation noted  Last office visit 11/16/22.  Caregiver from group home present.  No nausea or vomiting.  Had daily bowel movement which is also normal.  Has occasional smearing in her underwear but otherwise stable.  No abdominal pain, jaundice, melena, BRBPR, fevers, lack of appetite, weight loss, reflux, dysphagia.  Sleeps well most days.  Has history of aspiration pneumonia and reported a cough that usually happens during the fall wintertime.  No recent issues.  Advised to recheck HBV DNA as well as surface antigen and AFP and order ultrasound.  Advised continue Entecavir  0.5 mg daily.  Scheduled for colonoscopy. Per Dr. Eartha given F2-F3 fibrosis and her ethnicity it was reasonable to proceed with ultrasound and AFP every 6 months.  Labs 11/16/2022: AFP 2.4.  Hep B surface antigen positive.  HBV DNA not detected.  Advise continue Entecavir .  Repeat AFP and HBV surface antigen in 6 months.  Colonoscopy 12/13/22: - One 15 to 18 mm polyp in the ascending colon, removed with a hot snare. Resected and retrieved via EMR. Clips were placed (sessile serrated versus hyperplastic) - One 4 mm polyp in the sigmoid colon (tubular adenoma without high-grade dysplasia, inflammatory) - One 1 mm polyp in the rectum (tubular adenoma without high-grade dysplasia, inflammatory) - Scar in the distal rectum. Biopsied. (Nonspecific architectural changes) - The distal rectum and anal verge are normal on  retroflexion view. - Advised repeat in 3 years  Ultrasound ordered last year was not performed.  Labs 10/12/2023: Normal LFTs.  CMP normal.  CBC normal other than leukocytosis with WBC 12 although improved from prior.  Today:   History provided by caregiver as well as patient.  Patient denies any chest pain,  shortness of breath, nausea, vomiting, trouble swallowing, abdominal pain, lack of appetite.  Caregiver also states that she eats well, usually eats too fast which is likely psychologically induced given possible prior trauma.  She also states that her stools have been more firm/solid but not overtly hard.  They do not observe her straining at all.  No presence of any melena or BRBPR.  Weights have been stable.  No concerns for worsening confusion/encephalopathy, jaundice, or pruritus.  No abdominal swelling or peripheral swelling noted.  She does have a chronic cough but has been doing well with her inhalers and has had no recent pneumonia or upper respiratory infections.   Wt Readings from Last 5 Encounters:  10/18/23 123 lb 11.2 oz (56.1 kg)  10/12/23 123 lb (55.8 kg)  04/03/23 124 lb (56.2 kg)  12/09/22 (P) 113 lb 15.7 oz (51.7 kg)  11/16/22 114 lb (51.7 kg)    Current Outpatient Medications  Medication Sig Dispense Refill   acetaminophen  (TYLENOL ) 500 MG tablet Take 1 tablet (500 mg total) by mouth every 6 (six) hours as needed for moderate pain, fever or headache. 90 tablet 1   albuterol  (PROVENTIL ) (2.5 MG/3ML) 0.083% nebulizer solution Take 3 mLs (2.5 mg total) by nebulization every 6 (six) hours as needed for wheezing or shortness of breath. 75 mL 5   Amantadine  HCl 100 MG tablet Take 100 mg by mouth every morning.     budesonide -formoterol  (SYMBICORT ) 160-4.5 MCG/ACT inhaler Inhale 2 puffs into the lungs 2 (two) times daily. 1 each 3   Cholecalciferol (VITAMIN D3) 50 MCG (2000 UT) capsule Take 1 capsule (2,000 Units total) by mouth daily. 30 capsule 5   clozapine  (CLOZARIL ) 200 MG tablet TAKE 2 TABLETS BY MOUTH AT BEDTIME. 60 tablet 1   entecavir  (BARACLUDE ) 0.5 MG tablet TAKE (1) TABLET BY MOUTH ONCE DAILY. 30 tablet 11   feeding supplement (BOOST HIGH PROTEIN) LIQD Take 1 Container by mouth 3 (three) times daily between meals. 237 mls Tid between meals.     fluticasone  (FLONASE ) 50  MCG/ACT nasal spray SPRAY 2 SPRAYS IN RIGHT NOSTRIL ONLY TWICE DAILY. 16 g 3   Gauze Pads & Dressings (STERILE GAUZE) 3X3 PADS 2 packets by Does not apply route daily. 30 each 1   guaiFENesin  (MUCINEX ) 600 MG 12 hr tablet Take 2 tablets (1,200 mg total) by mouth 2 (two) times daily. 120 tablet 2   hydrOXYzine  (VISTARIL ) 25 MG capsule TAKE 1 CAPSULE BY MOUTH EVERY 8 HOURS AS NEEDED FOR ANXIETY 90 capsule 11   levonorgestrel  (MIRENA ) 20 MCG/24HR IUD 1 each by Intrauterine route once.     loratadine  (CLARITIN ) 10 MG tablet Take 1 tablet (10 mg total) by mouth daily. 90 tablet 3   mirtazapine  (REMERON ) 15 MG tablet Take 15 mg by mouth at bedtime.     Omega-3 Fatty Acids (FISH OIL ) 1200 MG CAPS Take 1 capsule (1,200 mg total) by mouth 2 (two) times daily. 180 capsule 3   polyethylene glycol powder (GOODSENSE CLEARLAX) 17 GM/SCOOP powder Take 127.5 g by mouth 2 (two) times daily. (Patient taking differently: Take 1 Container by mouth 2 (two) times daily. 17 grams Bid)  PROZAC  40 MG capsule Take by mouth.     Skin Protectants, Misc. (MINERIN CREME) CREA Apply 1 Application topically See admin instructions. Apply moderate amount topically to dry skin on soles and heels of both feet after shower     Tiotropium Bromide Monohydrate  (SPIRIVA  RESPIMAT) 1.25 MCG/ACT AERS USE 2 PUFFS BY MOUTH ONCE DAILY 4 g 0   vitamin E  200 UNIT capsule Take 1 capsule (200 Units total) by mouth daily. 30 capsule 11   No current facility-administered medications for this visit.    Past Medical History:  Diagnosis Date   Anxiety    Depression    Hematuria 11/12/2014   Hepatitis    Hepatitis B carrier (HCC)    Hypothyroidism    Impulse disorder    IUD (intrauterine device) in place 11/12/2014   Inserted 12/24/12 at [facility]   Liver fibrosis    Mild intellectual disability    Pneumonia 01/2022   Pulmonary nodules 01/2022   Schizophrenia (HCC)    Tuberculosis    as a child   Urinary frequency 11/12/2014    Past  Surgical History:  Procedure Laterality Date   ARM WOUND REPAIR / CLOSURE     BIOPSY  12/13/2022   Procedure: BIOPSY;  Surgeon: Eartha Angelia Sieving, MD;  Location: [facility];  Service: Gastroenterology;;   BRONCHIAL BRUSHINGS  02/02/2022   Procedure: BRONCHIAL BRUSHINGS;  Surgeon: Kara Dorn NOVAK, MD;  Location: [facility] ENDOSCOPY;  Service: Pulmonary;;   BRONCHIAL WASHINGS  02/02/2022   Procedure: BRONCHIAL WASHINGS;  Surgeon: Kara Dorn NOVAK, MD;  Location: [facility] ENDOSCOPY;  Service: Pulmonary;;   COLONOSCOPY WITH PROPOFOL  N/A 12/13/2022   Procedure: COLONOSCOPY WITH PROPOFOL ;  Surgeon: Eartha Angelia Sieving, MD;  Location: [facility];  Service: Gastroenterology;  Laterality: N/A;  10:15am, asa 3, [facility]   HEMOSTASIS CLIP PLACEMENT  12/13/2022   Procedure: HEMOSTASIS CLIP PLACEMENT;  Surgeon: Eartha Angelia Sieving, MD;  Location: [facility];  Service: Gastroenterology;;   POLYPECTOMY  12/13/2022   Procedure: POLYPECTOMY;  Surgeon: Eartha Angelia, Sieving, MD;  Location: [facility];  Service: Gastroenterology;;  hot and cold   SUBMUCOSAL LIFTING INJECTION  12/13/2022   Procedure: SUBMUCOSAL LIFTING INJECTION;  Surgeon: Eartha Angelia Sieving, MD;  Location: [facility];  Service: Gastroenterology;;   TRACHEAL SURGERY     TRACHEOSTOMY     removed   VIDEO BRONCHOSCOPY N/A 02/02/2022   Procedure: VIDEO BRONCHOSCOPY WITH FLUORO;  Surgeon: Kara Dorn NOVAK, MD;  Location: [facility] ENDOSCOPY;  Service: Pulmonary;  Laterality: N/A;    Family History  Adopted: Yes  Family history unknown: Yes    Allergies as of 10/18/2023 - Review Complete 10/18/2023  Allergen Reaction Noted   Lactose Nausea And Vomiting 08/06/2012   Olanzapine  08/06/2012   Tuberculin, ppd  08/06/2012   Milk (cow) Nausea And Vomiting 08/06/2012    Social History   Socioeconomic History   Marital status: Single    Spouse name: Not on file   Number of children: 0   Years of education:  12   Highest education level: High school graduate  Occupational History   Occupation: disabled    Comment: due to schizophrenia  Tobacco Use   Smoking status: Never    Passive exposure: Never   Smokeless tobacco: Never  Vaping Use   Vaping status: Never Used  Substance and Sexual Activity   Alcohol use: No    Alcohol/week: 0.0 standard drinks of alcohol   Drug use: No  Sexual activity: Not Currently    Birth control/protection: I.U.D.    Comment: Mirena  IUD  Other Topics Concern   Not on file  Social History Narrative   Patient was adopted. She does not have any children and has never been married. She lives in a group home and is disabled. Has a diagnosis of schizophrenia. She socially isolates herself, doesn't like to participate in group activities, sleeps a lot, has to be prompted to bathe, eat, dress, etc. -07/22/20   Social Drivers of Health   Financial Resource Strain: Low Risk  (07/25/2022)   Overall Financial Resource Strain (CARDIA)    Difficulty of Paying Living Expenses: Not hard at all  Food Insecurity: No Food Insecurity (07/25/2022)   Hunger Vital Sign    Worried About Running Out of Food in the Last Year: Never true    Ran Out of Food in the Last Year: Never true  Transportation Needs: No Transportation Needs (07/25/2022)   PRAPARE - Administrator, Civil Service (Medical): No    Lack of Transportation (Non-Medical): No  Physical Activity: Insufficiently Active (07/25/2022)   Exercise Vital Sign    Days of Exercise per Week: 3 days    Minutes of Exercise per Session: 30 min  Stress: No Stress Concern Present (07/25/2022)   [employer] of Occupational Health - Occupational Stress Questionnaire    Feeling of Stress : Only a little  Social Connections: Socially Isolated (07/25/2022)   Social Connection and Isolation Panel    Frequency of Communication with Friends and Family: Never    Frequency of Social Gatherings with Friends and Family: More  than three times a week    Attends Religious Services: Never    Database administrator or Organizations: No    Attends Banker Meetings: Never    Marital Status: Never married     Review of Systems   Gen: Denies fever, chills, anorexia. Denies fatigue, weakness, weight loss.  CV: Denies chest pain, palpitations, syncope, peripheral edema, and claudication. Resp: Denies dyspnea at rest, cough, wheezing, coughing up blood, and pleurisy. GI: See HPI Derm: Denies rash, itching, dry skin Psych: Denies depression, anxiety, memory loss, confusion. No homicidal or suicidal ideation.  Heme: Denies bruising, bleeding, and enlarged lymph nodes.  Physical Exam   BP 94/62   Pulse 90   Temp 97.7 F (36.5 C)   Ht 5' 4 (1.626 m)   Wt 123 lb 11.2 oz (56.1 kg)   BMI 21.23 kg/m   General:   Alert and oriented. No distress noted. Pleasant and cooperative.  Head:  Normocephalic and atraumatic. Eyes:  Conjuctiva clear without scleral icterus. Mouth:  Oral mucosa pink and moist. Good dentition. No lesions. Lungs:  Clear to auscultation bilaterally. No wheezes, rales, or rhonchi. No distress.  Heart:  S1, S2 present without murmurs appreciated.  Abdomen:  +BS, soft, non-tender and non-distended. No rebound or guarding. No HSM or masses noted. Rectal: Deferred Msk:  Symmetrical without gross deformities. Normal posture. Extremities:  Without edema. Neurologic:  Alert and  oriented x4 Psych:  Alert and cooperative. Normal mood and affect.  Assessment  Rayelle Armor is a 53 y.o. female presenting today for follow-up of chronic hepatitis B.  Chronic hepatitis B e Ab +: Diagnosed via biopsy in 2016.  Last levels with DNA level undetectable with positive surface antigen.  Last ultrasound in October 2023 with low K PA and no evidence of liver lesion or ductal dilation.  She has  been compliant with her Entecavir  on a daily basis and her most recent labs had normal LFTs.  Continues to  remain without any signs/symptoms of hepatocellular carcinoma.  No hepatic encephalopathy, jaundice, pruritus, mental status changes, ascites or peripheral edema.  She is well overdue for her ultrasound so ordered again today.  Advised to schedule with [name] at the facility or her guardian.  She will do labs today at [facility].  Constipation: Has continued on MiraLAX  17 g twice daily.  Having good regular bowel movements about every other day.  Denies any abdominal pain, melena, or BRBPR.  PLAN   HBV DNA, HBV sAg, AFP RUQ US  Continue Entecavir  0.5 mg daily. Important to keep follow up for hep B labs and ultrasound every 6 months Continue MiraLAX  twice daily as needed. Follow up 6 months    Charmaine Melia, MSN, FNP-BC, AGACNP-BC [facility]  I have reviewed the note and agree with the APP's assessment as described in this progress note  Toribio Fortune, MD Gastroenterology and Hepatology [facility]

## 2023-10-18 ENCOUNTER — Ambulatory Visit (INDEPENDENT_AMBULATORY_CARE_PROVIDER_SITE_OTHER): Admitting: Gastroenterology

## 2023-10-18 ENCOUNTER — Encounter: Payer: Self-pay | Admitting: Gastroenterology

## 2023-10-18 ENCOUNTER — Encounter: Payer: Self-pay | Admitting: *Deleted

## 2023-10-18 VITALS — BP 94/62 | HR 90 | Temp 97.7°F | Ht 64.0 in | Wt 123.7 lb

## 2023-10-18 DIAGNOSIS — K5900 Constipation, unspecified: Secondary | ICD-10-CM | POA: Diagnosis not present

## 2023-10-18 DIAGNOSIS — B181 Chronic viral hepatitis B without delta-agent: Secondary | ICD-10-CM

## 2023-10-18 NOTE — Patient Instructions (Addendum)
 Scheduled for an ultrasound of your liver in the near future.  Please complete labs at [facility].  Will be in touch with results once received.  For now please continue taking Entecavir  0.5 mg daily.  Continue MiraLAX  twice daily as needed.  We need to do labs and ultrasound every 6 months to monitor your liver.  It was great to see you again today!  It was a pleasure to see you today. I want to create trusting relationships with patients. If you receive a survey regarding your visit,  I greatly appreciate you taking time to fill this out on paper or through your MyChart. I value your feedback.  Charmaine Melia, MSN, FNP-BC, AGACNP-BC [facility]

## 2023-10-19 ENCOUNTER — Ambulatory Visit: Payer: Self-pay | Admitting: Family Medicine

## 2023-10-20 ENCOUNTER — Other Ambulatory Visit: Payer: Self-pay | Admitting: Family Medicine

## 2023-10-20 LAB — HEPATITIS B DNA, ULTRAQUANTITATIVE, PCR
Hepatitis B DNA: NOT DETECTED [IU]/mL
Hepatitis B virus DNA: NOT DETECTED {Log_IU}/mL

## 2023-10-20 LAB — HEPATITIS B SURFACE ANTIGEN: Hepatitis B Surface Ag: REACTIVE — AB

## 2023-10-20 LAB — AFP TUMOR MARKER: AFP-Tumor Marker: 2.8 ng/mL

## 2023-10-25 ENCOUNTER — Ambulatory Visit: Payer: Self-pay | Admitting: Gastroenterology

## 2023-10-25 ENCOUNTER — Encounter: Payer: Self-pay | Admitting: Gastroenterology

## 2023-10-26 ENCOUNTER — Ambulatory Visit (HOSPITAL_COMMUNITY)
Admission: RE | Admit: 2023-10-26 | Discharge: 2023-10-26 | Disposition: A | Discharge status: Home / Self Care | Source: Ambulatory Visit | Attending: Gastroenterology | Admitting: Gastroenterology

## 2023-10-26 DIAGNOSIS — K828 Other specified diseases of gallbladder: Secondary | ICD-10-CM | POA: Diagnosis not present

## 2023-10-26 DIAGNOSIS — B181 Chronic viral hepatitis B without delta-agent: Secondary | ICD-10-CM | POA: Diagnosis present

## 2023-11-10 DIAGNOSIS — Z79899 Other long term (current) drug therapy: Secondary | ICD-10-CM | POA: Diagnosis not present

## 2023-11-27 ENCOUNTER — Other Ambulatory Visit: Payer: Self-pay | Admitting: Family Medicine

## 2023-11-28 ENCOUNTER — Other Ambulatory Visit: Payer: Self-pay | Admitting: Family Medicine

## 2023-11-28 DIAGNOSIS — J189 Pneumonia, unspecified organism: Secondary | ICD-10-CM

## 2023-12-28 ENCOUNTER — Ambulatory Visit (INDEPENDENT_AMBULATORY_CARE_PROVIDER_SITE_OTHER)

## 2023-12-28 VITALS — BP 94/62 | HR 90 | Ht 64.0 in | Wt 123.0 lb

## 2023-12-28 DIAGNOSIS — Z0000 Encounter for general adult medical examination without abnormal findings: Secondary | ICD-10-CM | POA: Diagnosis not present

## 2023-12-28 NOTE — Patient Instructions (Signed)
 Margaret [name],  Thank you for taking the time for your Medicare Wellness Visit. I appreciate your continued commitment to your health goals. Please review the care plan we discussed, and feel free to reach out if I can assist you further.  Medicare recommends these wellness visits once per year to help you and your care team stay ahead of potential health issues. These visits are designed to focus on prevention, allowing your provider to concentrate on managing your acute and chronic conditions during your regular appointments.  Please note that Annual Wellness Visits do not include a physical exam. Some assessments may be limited, especially if the visit was conducted virtually. If needed, we may recommend a separate in-person follow-up with your provider.  Ongoing Care Seeing your primary care provider every 3 to 6 months helps us  monitor your health and provide consistent, personalized care.   Referrals If a referral was made during today's visit and you haven't received any updates within two weeks, please contact the referred provider directly to check on the status.  Recommended Screenings:  Health Maintenance  Topic Date Due   Pneumococcal Vaccine for age over 72 (1 of 2 - PCV) Never done   Hepatitis B Vaccine (1 of 3 - 19+ 3-dose series) 03/14/1991   Medicare Annual Wellness Visit  07/25/2023   Flu Shot  11/03/2023   COVID-19 Vaccine (5 - 2025-26 season) 12/04/2023   Zoster (Shingles) Vaccine (1 of 2) 01/12/2024*   DTaP/Tdap/Td vaccine (1 - Tdap) 10/11/2024*   Breast Cancer Screening  09/20/2024   Colon Cancer Screening  12/12/2025   Pap with HPV screening  03/31/2026   Hepatitis C Screening  Completed   HIV Screening  Completed   HPV Vaccine  Aged Out   Meningitis B Vaccine  Aged Out  *Topic was postponed. The date shown is not the original due date.       12/28/2023   11:16 AM  Advanced Directives  Does Patient Have a Medical Advance Directive? No   Advance Care  Planning is important because it: Ensures you receive medical care that aligns with your values, goals, and preferences. Provides guidance to your family and loved ones, reducing the emotional burden of decision-making during critical moments.  Vision: Annual vision screenings are recommended for early detection of glaucoma, cataracts, and diabetic retinopathy. These exams can also reveal signs of chronic conditions such as diabetes and high blood pressure.  Dental: Annual dental screenings help detect early signs of oral cancer, gum disease, and other conditions linked to overall health, including heart disease and diabetes.  Please see the attached documents for additional preventive care recommendations.

## 2023-12-28 NOTE — Progress Notes (Signed)
 Subjective:   Margaret [name] is a 53 y.o. who presents for a Medicare Wellness preventive visit.  As a reminder, Annual Wellness Visits don't include a physical exam, and some assessments may be limited, especially if this visit is performed virtually. We may recommend an in-person follow-up visit with your provider if needed.  Visit Complete: Virtual I connected with  Margaret [name] on 12/28/23 by a audio enabled telemedicine application and verified that I am speaking with the correct person using two identifiers.  Patient Location: Home  Provider Location: Home Office  I discussed the limitations of evaluation and management by telemedicine. The patient expressed understanding and agreed to proceed.  Vital Signs: Because this visit was a virtual/telehealth visit, some criteria may be missing or patient reported. Any vitals not documented were not able to be obtained and vitals that have been documented are patient reported.  VideoDeclined- This patient declined Librarian, academic. Therefore the visit was completed with audio only.  Persons Participating in Visit: Patient. Assist by [name] Rife [facility] Admin   AWV Questionnaire: No: Patient Medicare AWV questionnaire was not completed prior to this visit.  Cardiac Risk Factors include: advanced age (>21men, >12 women);hypertension;dyslipidemia     Objective:    Today's Vitals   12/28/23 1127  BP: 94/62  Pulse: 90  Weight: 123 lb (55.8 kg)  Height: 5' 4 (1.626 m)   Body mass index is 21.11 kg/m.     12/28/2023   11:16 AM 12/13/2022    8:37 AM 12/09/2022   12:39 PM 07/25/2022    3:28 PM 02/02/2022    7:32 AM 10/07/2021    2:54 PM 07/23/2021    3:58 PM  Advanced Directives  Does Patient Have a Medical Advance Directive? No No No No No No Unable to assess, patient is non-responsive or altered mental status  Would patient like information on creating a medical advance directive?   No - Patient declined No - Patient declined No - Patient declined No - Patient declined      Current Medications (verified) Outpatient Encounter Medications as of 12/28/2023  Medication Sig   acetaminophen  (TYLENOL ) 500 MG tablet Take 1 tablet (500 mg total) by mouth every 6 (six) hours as needed for moderate pain, fever or headache.   albuterol  (PROVENTIL ) (2.5 MG/3ML) 0.083% nebulizer solution Take 3 mLs (2.5 mg total) by nebulization every 6 (six) hours as needed for wheezing or shortness of breath.   ALLERGY RELIEF 10 MG tablet TAKE 1 TABLET BY MOUTH ONCE DAILY.   Amantadine  HCl 100 MG tablet Take 100 mg by mouth every morning.   Cholecalciferol (VITAMIN D3) 50 MCG (2000 UT) capsule TAKE 1 CAPSULE BY MOUTH EVERY DAY   clozapine  (CLOZARIL ) 200 MG tablet TAKE 2 TABLETS BY MOUTH AT BEDTIME.   entecavir  (BARACLUDE ) 0.5 MG tablet TAKE (1) TABLET BY MOUTH ONCE DAILY.   feeding supplement (BOOST HIGH PROTEIN) LIQD Take 1 Container by mouth 3 (three) times daily between meals. 237 mls Tid between meals.   fluticasone  (FLONASE ) 50 MCG/ACT nasal spray SPRAY 2 SPRAYS IN RIGHT NOSTRIL ONLY TWICE DAILY.   Gauze Pads & Dressings (STERILE GAUZE) 3X3 PADS 2 packets by Does not apply route daily.   hydrOXYzine  (VISTARIL ) 25 MG capsule TAKE 1 CAPSULE BY MOUTH EVERY 8 HOURS AS NEEDED FOR ANXIETY   levonorgestrel  (MIRENA ) 20 MCG/24HR IUD 1 each by Intrauterine route once.   mirtazapine  (REMERON ) 15 MG tablet Take 15 mg by mouth at  bedtime.   MUCUS RELIEF 600 MG 12 hr tablet TAKE 2 TABLETS BY MOUTH TWICE DAILY.   Omega-3 Fatty Acids (FISH OIL ) 1200 MG CAPS Take 1 capsule (1,200 mg total) by mouth 2 (two) times daily.   polyethylene glycol powder (GOODSENSE CLEARLAX) 17 GM/SCOOP powder Take 127.5 g by mouth 2 (two) times daily. (Patient taking differently: Take 1 Container by mouth 2 (two) times daily. 17 grams Bid)   PROZAC  40 MG capsule Take by mouth.   Skin Protectants, Misc. (MINERIN CREME) CREA Apply 1  Application topically See admin instructions. Apply moderate amount topically to dry skin on soles and heels of both feet after shower   SYMBICORT  160-4.5 MCG/ACT inhaler INHALE 2 PUFFS INTO THE LUNGS TWICE A DAY   Tiotropium Bromide Monohydrate  (SPIRIVA  RESPIMAT) 1.25 MCG/ACT AERS USE 2 PUFFS BY MOUTH ONCE DAILY   vitamin E  200 UNIT capsule TAKE 1 CAPSULE BY MOUTH ONCE A DAY.   No facility-administered encounter medications on file as of 12/28/2023.    Allergies (verified) Lactose; Olanzapine; Tuberculin, ppd; and Milk (cow)   History: Past Medical History:  Diagnosis Date   Anxiety    Depression    Hematuria 11/12/2014   Hepatitis    Hepatitis B carrier (HCC)    Hypothyroidism    Impulse disorder    IUD (intrauterine device) in place 11/12/2014   Inserted 12/24/12 at [facility]   Liver fibrosis    Mild intellectual disability    Pneumonia 01/2022   Pulmonary nodules 01/2022   Schizophrenia (HCC)    Tuberculosis    as a child   Urinary frequency 11/12/2014   Past Surgical History:  Procedure Laterality Date   ARM WOUND REPAIR / CLOSURE     BIOPSY  12/13/2022   Procedure: BIOPSY;  Surgeon: Eartha Angelia Sieving, MD;  Location: [facility];  Service: Gastroenterology;;   BRONCHIAL BRUSHINGS  02/02/2022   Procedure: BRONCHIAL BRUSHINGS;  Surgeon: Kara Dorn NOVAK, MD;  Location: [facility] ENDOSCOPY;  Service: Pulmonary;;   BRONCHIAL WASHINGS  02/02/2022   Procedure: BRONCHIAL WASHINGS;  Surgeon: Kara Dorn NOVAK, MD;  Location: [facility] ENDOSCOPY;  Service: Pulmonary;;   COLONOSCOPY WITH PROPOFOL  N/A 12/13/2022   Procedure: COLONOSCOPY WITH PROPOFOL ;  Surgeon: Eartha Angelia Sieving, MD;  Location: [facility];  Service: Gastroenterology;  Laterality: N/A;  10:15am, asa 3, [facility]   HEMOSTASIS CLIP PLACEMENT  12/13/2022   Procedure: HEMOSTASIS CLIP PLACEMENT;  Surgeon: Eartha Angelia Sieving, MD;  Location: [facility];  Service: Gastroenterology;;   POLYPECTOMY   12/13/2022   Procedure: POLYPECTOMY;  Surgeon: Eartha Angelia, Sieving, MD;  Location: [facility];  Service: Gastroenterology;;  hot and cold   SUBMUCOSAL LIFTING INJECTION  12/13/2022   Procedure: SUBMUCOSAL LIFTING INJECTION;  Surgeon: Eartha Angelia Sieving, MD;  Location: [facility];  Service: Gastroenterology;;   TRACHEAL SURGERY     TRACHEOSTOMY     removed   VIDEO BRONCHOSCOPY N/A 02/02/2022   Procedure: VIDEO BRONCHOSCOPY WITH FLUORO;  Surgeon: Kara Dorn NOVAK, MD;  Location: [facility] ENDOSCOPY;  Service: Pulmonary;  Laterality: N/A;   Family History  Adopted: Yes  Family history unknown: Yes   Social History   Socioeconomic History   Marital status: Single    Spouse name: Not on file   Number of children: 0   Years of education: 12   Highest education level: High school graduate  Occupational History   Occupation: disabled    Comment: due to schizophrenia  Tobacco Use  Smoking status: Never    Passive exposure: Never   Smokeless tobacco: Never  Vaping Use   Vaping status: Never Used  Substance and Sexual Activity   Alcohol use: No    Alcohol/week: 0.0 standard drinks of alcohol   Drug use: No   Sexual activity: Not Currently    Birth control/protection: I.U.D.    Comment: Mirena  IUD  Other Topics Concern   Not on file  Social History Narrative   Patient was adopted. She does not have any children and has never been married. She lives in a group home and is disabled. Has a diagnosis of schizophrenia. She socially isolates herself, doesn't like to participate in group activities, sleeps a lot, has to be prompted to bathe, eat, dress, etc. -07/22/20   Social Drivers of Health   Financial Resource Strain: Low Risk  (12/28/2023)   Overall Financial Resource Strain (CARDIA)    Difficulty of Paying Living Expenses: Not hard at all  Food Insecurity: No Food Insecurity (12/28/2023)   Hunger Vital Sign    Worried About Running Out of Food in the Last Year: Never  true    Ran Out of Food in the Last Year: Never true  Transportation Needs: No Transportation Needs (12/28/2023)   PRAPARE - Administrator, Civil Service (Medical): No    Lack of Transportation (Non-Medical): No  Physical Activity: Sufficiently Active (12/28/2023)   Exercise Vital Sign    Days of Exercise per Week: 7 days    Minutes of Exercise per Session: 30 min  Stress: No Stress Concern Present (12/28/2023)   [employer] of Occupational Health - Occupational Stress Questionnaire    Feeling of Stress: Only a little  Social Connections: Socially Isolated (12/28/2023)   Social Connection and Isolation Panel    Frequency of Communication with Friends and Family: Never    Frequency of Social Gatherings with Friends and Family: More than three times a week    Attends Religious Services: Never    Database administrator or Organizations: No    Attends Engineer, structural: Never    Marital Status: Never married    Tobacco Counseling Counseling given: Yes    Clinical Intake:  Pre-visit preparation completed: Yes  Pain : No/denies pain     BMI - recorded: 21.11 Nutritional Status: BMI of 19-24  Normal Nutritional Risks: None Diabetes: No  No results found for: HGBA1C   How often do you need to have someone help you when you read instructions, pamphlets, or other written materials from your doctor or pharmacy?: 5 - Always Francie Weatherford and [facility] Admin)  Interpreter Needed?: No  Comments: awv is assisted per [employer] [name] Admin Information entered by :: alia t/cma   Activities of Daily Living     12/28/2023   11:13 AM  In your present state of health, do you have any difficulty performing the following activities:  Hearing? 0  Vision? 0  Difficulty concentrating or making decisions? 1  Walking or climbing stairs? 0  Dressing or bathing? 0  Doing errands, shopping? 1  Comment [name] [employer] and eating ? Y  Comment [name] Admin  Using the Toilet? N  In the past six months, have you accidently leaked urine? Y  Do you have problems with loss of bowel control? Y  Managing your Medications? Y  Comment [name] Admin  Managing your Finances? Y  Comment [name] [employer] or managing your Housekeeping? N    Patient Care Team: Dettinger, Fonda LABOR, MD as PCP - General (Family Medicine) Golda Claudis PENNER, MD (Inactive) as Consulting Physician (Gastroenterology) Celestia Joesph SQUIBB, RN as Oncology Nurse Navigator (Oncology) Kassie Acquanetta Bradley, MD as Consulting Physician (Pulmonary Disease) Viktoria Comer SAUNDERS, MD as Consulting Physician (Gynecologic Oncology) Dozier Oneil BROCKS, MD as Referring Physician (Neurology) Comer, Lamar ORN, MD as Consulting Physician (Infectious Diseases)  I have updated your Care Teams any recent Medical Services you may have received from other providers in the past year.     Assessment:   This is a routine wellness examination for Sanjana.  Hearing/Vision screen Hearing Screening - Comments:: Pt denies hearing dif Vision Screening - Comments:: Pt wear glasses/pt [facility] in [place], [place]/last last 2025   Goals Addressed               This Visit's Progress     Patient Stated (pt-stated)   On track     Increase social interaction with friends at the group home.        Depression Screen     12/28/2023   11:17 AM 10/12/2023    3:28 PM 04/03/2023    2:27 PM 09/29/2022    1:09 PM 07/25/2022    3:31 PM 02/22/2022    3:02 PM 02/07/2022    2:04 PM  PHQ 2/9 Scores  PHQ - 2 Score 3 0 0 1 0 0 1  PHQ- 9 Score 8 2         Fall Risk     12/28/2023   11:12 AM 10/12/2023    3:28 PM 04/03/2023    2:27 PM 09/29/2022    1:09 PM 07/25/2022    3:25 PM  Fall Risk   Falls in the past year? 0 0 0 0 0   Number falls in past yr: 0 0 0  0  Injury with Fall? 0 0 0  0  Risk for fall due to : No Fall Risks No Fall Risks No Fall Risks  No Fall Risks  Follow up Falls evaluation completed Falls evaluation completed Falls evaluation completed  Falls prevention discussed;Education provided;Falls evaluation completed    MEDICARE RISK AT HOME:  Medicare Risk at Home Any stairs in or around the home?: No If so, are there any without handrails?: No Home free of loose throw rugs in walkways, pet beds, electrical cords, etc?: Yes Adequate lighting in your home to reduce risk of falls?: Yes Life alert?: No Use of a cane, walker or w/c?: No Grab bars in the bathroom?: Yes Shower chair or bench in shower?: Yes Elevated toilet seat or a handicapped toilet?: No  TIMED UP AND GO:  Was the test performed?  no  Cognitive Function: Not completed due to cognitive impairment DX         07/25/2022    3:27 PM 07/23/2021    4:00 PM 04/26/2018   11:36 AM  6CIT Screen  What Year? 0 points 0 points 0 points  What month? 0 points 0 points 0 points  What time? 0 points 0 points 0 points  Count back from 20 0 points 0 points 0 points  Months in reverse 4 points 4 points 2 points  Repeat phrase 6 points 8 points 10 points  Total Score 10 points 12 points 12 points  Immunizations Immunization History  Administered Date(s) Administered   Influenza,inj,Quad PF,6+ Mos 12/30/2015, 12/30/2016, 03/14/2018, 02/20/2019, 12/28/2021   Influenza-Unspecified 01/12/2015, 01/07/2020, 01/09/2021   Moderna Covid-19 Vaccine Bivalent Booster 16yrs & up 02/15/2021   Moderna Sars-Covid-2 Vaccination 06/10/2019, 07/09/2019, 02/11/2020    Screening Tests Health Maintenance  Topic Date Due   Pneumococcal Vaccine: 50+ Years (1 of 2 - PCV) Never done   Hepatitis B Vaccines 19-59 Average Risk (1 of 3 - 19+ 3-dose series) 03/14/1991   Influenza Vaccine  11/03/2023   COVID-19 Vaccine (5 - 2025-26 season) 12/04/2023   Zoster  Vaccines- Shingrix (1 of 2) 01/12/2024 (Originally 03/14/1991)   DTaP/Tdap/Td (1 - Tdap) 10/11/2024 (Originally 03/14/1991)   Mammogram  09/20/2024   Medicare Annual Wellness (AWV)  12/27/2024   Colonoscopy  12/12/2025   Cervical Cancer Screening (HPV/Pap Cotest)  03/31/2026   Hepatitis C Screening  Completed   HIV Screening  Completed   HPV VACCINES  Aged Out   Meningococcal B Vaccine  Aged Out    Health Maintenance Items Addressed: See Nurse Notes at the end of this note  Additional Screening:  Vision Screening: Recommended annual ophthalmology exams for early detection of glaucoma and other disorders of the eye. Is the patient up to date with their annual eye exam?  Yes  Who is the provider or what is the name of the office in which the patient attends annual eye exams? [facility] in [place], [place]  Dental Screening: Recommended annual dental exams for proper oral hygiene  Community Resource Referral / Chronic Care Management: CRR required this visit?  No   CCM required this visit?  No   Plan:    I have personally reviewed and noted the following in the patient's chart:   Medical and social history Use of alcohol, tobacco or illicit drugs  Current medications and supplements including opioid prescriptions. Patient is not currently taking opioid prescriptions. Functional ability and status Nutritional status Physical activity Advanced directives List of other physicians Hospitalizations, surgeries, and ER visits in previous 12 months Vitals Screenings to include cognitive, depression, and falls Referrals and appointments  In addition, I have reviewed and discussed with patient certain preventive protocols, quality metrics, and best practice recommendations. A written personalized care plan for preventive services as well as general preventive health recommendations were provided to patient.   Ozie Ned, CMA   12/28/2023   After Visit Summary: (MyChart)  Due to this being a telephonic visit, the after visit summary with patients personalized plan was offered to patient via MyChart   Notes: Nothing significant to report at this time.

## 2024-01-17 ENCOUNTER — Encounter (INDEPENDENT_AMBULATORY_CARE_PROVIDER_SITE_OTHER): Payer: Self-pay | Admitting: Gastroenterology

## 2024-01-18 ENCOUNTER — Other Ambulatory Visit: Payer: Self-pay | Admitting: Family Medicine

## 2024-01-18 DIAGNOSIS — Z79899 Other long term (current) drug therapy: Secondary | ICD-10-CM | POA: Diagnosis not present

## 2024-01-23 ENCOUNTER — Encounter: Payer: Self-pay | Admitting: Family Medicine

## 2024-01-23 ENCOUNTER — Ambulatory Visit: Admitting: Family Medicine

## 2024-01-23 VITALS — BP 112/74 | HR 103 | Temp 97.5°F | Ht 64.0 in | Wt 133.8 lb

## 2024-01-23 DIAGNOSIS — H66001 Acute suppurative otitis media without spontaneous rupture of ear drum, right ear: Secondary | ICD-10-CM | POA: Diagnosis not present

## 2024-01-23 DIAGNOSIS — T161XXA Foreign body in right ear, initial encounter: Secondary | ICD-10-CM

## 2024-01-23 MED ORDER — AMOXICILLIN-POT CLAVULANATE 875-125 MG PO TABS
1.0000 | ORAL_TABLET | Freq: Two times a day (BID) | ORAL | 0 refills | Status: AC
Start: 2024-01-23 — End: 2024-02-02

## 2024-01-23 NOTE — Progress Notes (Signed)
 Subjective:  [name], female    DOB: Feb 18, 1971, 53 y.o.   MRN: [id number]  Patient Care Team: Dettinger, Fonda LABOR, MD as PCP - General (Family Medicine) Golda Claudis PENNER, MD (Inactive) as Consulting Physician (Gastroenterology) Celestia Joesph SQUIBB, RN as Oncology Nurse Navigator (Oncology) Kassie Acquanetta Bradley, MD as Consulting Physician (Pulmonary Disease) Viktoria Comer SAUNDERS, MD as Consulting Physician (Gynecologic Oncology) Dozier Oneil BROCKS, MD as Referring Physician (Neurology) Comer, Lamar ORN, MD (Inactive) as Consulting Physician (Infectious Diseases)   Chief Complaint:  Nasal Congestion and Ear Pain (Right ear- states something something is in her ear x 5 days )   HPI: Margaret [name] is a 53 y.o. female presenting on 01/23/2024 for Nasal Congestion and Ear Pain (Right ear- states something something is in her ear x 5 days )   Margaret [name] is a 53 year old female who presents with right ear pain and irritation.  Right otalgia and ear canal irritation - Right ear pain for approximately one week, beginning last Thursday - Pain is bothersome and associated with irritation - Frequent manipulation and picking at the right ear, resulting in some bleeding - Sensation of a foreign body or obstruction in the right ear canal - No fever - Mild congestion earlier in the day - No other systemic symptoms  Cerumen impaction - History of wax buildup in both ears, with the right ear being more problematic - Left ear has some wax but is otherwise unremarkable  Ear canal trauma - Use of Q-tips in both ears - Possible contribution of Q-tip use to irritation and bleeding in the right ear      Relevant past medical, surgical, family, and social history reviewed and updated as indicated.  Allergies and medications reviewed and updated. Data reviewed: Chart in Epic.   Past Medical History:  Diagnosis Date   Anxiety    Depression    Hematuria 11/12/2014   Hepatitis     Hepatitis B carrier (HCC)    Hypothyroidism    Impulse disorder    IUD (intrauterine device) in place 11/12/2014   Inserted 12/24/12 at [facility]   Liver fibrosis    Mild intellectual disability    Pneumonia 01/2022   Pulmonary nodules 01/2022   Schizophrenia (HCC)    Tuberculosis    as a child   Urinary frequency 11/12/2014    Past Surgical History:  Procedure Laterality Date   ARM WOUND REPAIR / CLOSURE     BIOPSY  12/13/2022   Procedure: BIOPSY;  Surgeon: Eartha Angelia Sieving, MD;  Location: [facility];  Service: Gastroenterology;;   BRONCHIAL BRUSHINGS  02/02/2022   Procedure: BRONCHIAL BRUSHINGS;  Surgeon: Kara Dorn NOVAK, MD;  Location: [facility] ENDOSCOPY;  Service: Pulmonary;;   BRONCHIAL WASHINGS  02/02/2022   Procedure: BRONCHIAL WASHINGS;  Surgeon: Kara Dorn NOVAK, MD;  Location: [facility] ENDOSCOPY;  Service: Pulmonary;;   COLONOSCOPY WITH PROPOFOL  N/A 12/13/2022   Procedure: COLONOSCOPY WITH PROPOFOL ;  Surgeon: Eartha Angelia Sieving, MD;  Location: [facility];  Service: Gastroenterology;  Laterality: N/A;  10:15am, asa 3, [facility] group home   HEMOSTASIS CLIP PLACEMENT  12/13/2022   Procedure: HEMOSTASIS CLIP PLACEMENT;  Surgeon: Eartha Angelia Sieving, MD;  Location: [facility];  Service: Gastroenterology;;   POLYPECTOMY  12/13/2022   Procedure: POLYPECTOMY;  Surgeon: Eartha Angelia Sieving, MD;  Location: [facility];  Service: Gastroenterology;;  hot and cold   SUBMUCOSAL LIFTING INJECTION  12/13/2022   Procedure: SUBMUCOSAL LIFTING INJECTION;  Surgeon: Eartha Flavors, Toribio, MD;  Location: [facility];  Service: Gastroenterology;;   TRACHEAL SURGERY     TRACHEOSTOMY     removed   VIDEO BRONCHOSCOPY N/A 02/02/2022   Procedure: VIDEO BRONCHOSCOPY WITH FLUORO;  Surgeon: Kara Dorn NOVAK, MD;  Location: [facility] ENDOSCOPY;  Service: Pulmonary;  Laterality: N/A;    Social History   Socioeconomic History   Marital status: Single    Spouse name: Not on file    Number of children: 0   Years of education: 12   Highest education level: High school graduate  Occupational History   Occupation: disabled    Comment: due to schizophrenia  Tobacco Use   Smoking status: Never    Passive exposure: Never   Smokeless tobacco: Never  Vaping Use   Vaping status: Never Used  Substance and Sexual Activity   Alcohol use: No    Alcohol/week: 0.0 standard drinks of alcohol   Drug use: No   Sexual activity: Not Currently    Birth control/protection: I.U.D.    Comment: Mirena  IUD  Other Topics Concern   Not on file  Social History Narrative   Patient was adopted. She does not have any children and has never been married. She lives in a group home and is disabled. Has a diagnosis of schizophrenia. She socially isolates herself, doesn't like to participate in group activities, sleeps a lot, has to be prompted to bathe, eat, dress, etc. -07/22/20   Social Drivers of Health   Financial Resource Strain: Low Risk  (12/28/2023)   Overall Financial Resource Strain (CARDIA)    Difficulty of Paying Living Expenses: Not hard at all  Food Insecurity: No Food Insecurity (12/28/2023)   Hunger Vital Sign    Worried About Running Out of Food in the Last Year: Never true    Ran Out of Food in the Last Year: Never true  Transportation Needs: No Transportation Needs (12/28/2023)   PRAPARE - Administrator, Civil Service (Medical): No    Lack of Transportation (Non-Medical): No  Physical Activity: Sufficiently Active (12/28/2023)   Exercise Vital Sign    Days of Exercise per Week: 7 days    Minutes of Exercise per Session: 30 min  Stress: No Stress Concern Present (12/28/2023)   [employer] of Occupational Health - Occupational Stress Questionnaire    Feeling of Stress: Only a little  Social Connections: Socially Isolated (12/28/2023)   Social Connection and Isolation Panel    Frequency of Communication with Friends and Family: Never    Frequency of  Social Gatherings with Friends and Family: More than three times a week    Attends Religious Services: Never    Database administrator or Organizations: No    Attends Banker Meetings: Never    Marital Status: Never married  Intimate Partner Violence: Not At Risk (12/28/2023)   Humiliation, Afraid, Rape, and Kick questionnaire    Fear of Current or Ex-Partner: No    Emotionally Abused: No    Physically Abused: No    Sexually Abused: No    Outpatient Encounter Medications as of 01/23/2024  Medication Sig   acetaminophen  (TYLENOL ) 500 MG tablet Take 1 tablet (500 mg total) by mouth every 6 (six) hours as needed for moderate pain, fever or headache.   albuterol  (PROVENTIL ) (2.5 MG/3ML) 0.083% nebulizer solution Take 3 mLs (2.5 mg total) by nebulization every 6 (six) hours as needed for wheezing or shortness of breath.   ALLERGY RELIEF  10 MG tablet TAKE 1 TABLET BY MOUTH ONCE DAILY.   Amantadine  HCl 100 MG tablet Take 100 mg by mouth every morning.   amoxicillin -clavulanate (AUGMENTIN ) 875-125 MG tablet Take 1 tablet by mouth 2 (two) times daily for 10 days.   Cholecalciferol (VITAMIN D3) 50 MCG (2000 UT) capsule TAKE 1 CAPSULE BY MOUTH EVERY DAY   clozapine  (CLOZARIL ) 200 MG tablet TAKE 2 TABLETS BY MOUTH AT BEDTIME.   entecavir  (BARACLUDE ) 0.5 MG tablet TAKE (1) TABLET BY MOUTH ONCE DAILY.   feeding supplement (BOOST HIGH PROTEIN) LIQD Take 1 Container by mouth 3 (three) times daily between meals. 237 mls Tid between meals.   fluticasone  (FLONASE ) 50 MCG/ACT nasal spray SPRAY 2 SPRAYS IN RIGHT NOSTRIL ONLY TWICE DAILY.   Gauze Pads & Dressings (STERILE GAUZE) 3X3 PADS 2 packets by Does not apply route daily.   hydrOXYzine  (ATARAX ) 25 MG tablet Take 25 mg by mouth daily.   hydrOXYzine  (VISTARIL ) 25 MG capsule TAKE 1 CAPSULE BY MOUTH EVERY 8 HOURS AS NEEDED FOR ANXIETY   levonorgestrel  (MIRENA ) 20 MCG/24HR IUD 1 each by Intrauterine route once.   mirtazapine  (REMERON ) 15 MG  tablet Take 15 mg by mouth at bedtime.   MUCUS RELIEF 600 MG 12 hr tablet TAKE 2 TABLETS BY MOUTH TWICE DAILY.   Omega-3 Fatty Acids (FISH OIL ) 1200 MG CAPS Take 1 capsule (1,200 mg total) by mouth 2 (two) times daily.   polyethylene glycol powder (GOODSENSE CLEARLAX) 17 GM/SCOOP powder Take 127.5 g by mouth 2 (two) times daily. (Patient taking differently: Take 1 Container by mouth 2 (two) times daily. 17 grams Bid)   PROZAC  40 MG capsule Take by mouth.   Skin Protectants, Misc. (MINERIN CREME) CREA Apply 1 Application topically See admin instructions. Apply moderate amount topically to dry skin on soles and heels of both feet after shower   SYMBICORT  160-4.5 MCG/ACT inhaler INHALE 2 PUFFS INTO THE LUNGS TWICE A DAY   Tiotropium Bromide Monohydrate  (SPIRIVA  RESPIMAT) 1.25 MCG/ACT AERS USE 2 PUFFS BY MOUTH ONCE DAILY   vitamin E  200 UNIT capsule TAKE 1 CAPSULE BY MOUTH ONCE A DAY.   No facility-administered encounter medications on file as of 01/23/2024.    Allergies  Allergen Reactions   Lactose Nausea And Vomiting    GI upset   Olanzapine     Other reaction(s): UNKNOWN   Tuberculin, Ppd     Other reaction(s): UNKNOWN   Milk (Cow) Nausea And Vomiting    Other reaction(s): GI upset    Pertinent ROS per HPI, otherwise unremarkable      Objective:  BP 112/74   Pulse (!) 103   Temp (!) 97.5 F (36.4 C)   Ht 5' 4 (1.626 m)   Wt 133 lb 12.8 oz (60.7 kg)   SpO2 95%   BMI 22.97 kg/m    Wt Readings from Last 3 Encounters:  01/23/24 133 lb 12.8 oz (60.7 kg)  12/28/23 123 lb (55.8 kg)  10/18/23 123 lb 11.2 oz (56.1 kg)    Physical Exam Vitals and nursing note reviewed.  Constitutional:      General: She is not in acute distress.    Appearance: She is normal weight. She is not ill-appearing, toxic-appearing or diaphoretic.  HENT:     Head: Normocephalic and atraumatic.     Right Ear: A foreign body is present.     Left Ear: Tympanic membrane, ear canal and external ear  normal.     Ears:  Comments: Right canal with abrasion/bleeding, foreign body    Nose: Nose normal.     Mouth/Throat:     Mouth: Mucous membranes are moist.  Eyes:     Conjunctiva/sclera: Conjunctivae normal.     Pupils: Pupils are equal, round, and reactive to light.  Cardiovascular:     Rate and Rhythm: Normal rate and regular rhythm.     Heart sounds: Normal heart sounds.  Pulmonary:     Effort: Pulmonary effort is normal.     Breath sounds: Normal breath sounds.  Musculoskeletal:     Cervical back: Neck supple.  Skin:    General: Skin is warm and dry.     Capillary Refill: Capillary refill takes less than 2 seconds.  Neurological:     Mental Status: She is alert. Mental status is at baseline.    Foreign Body Removal  Date/Time: 01/23/2024 2:14 PM  Performed by: Severa Rock HERO, FNP Authorized by: Severa Rock HERO, FNP  Body area: ear Location details: right ear  Sedation: Patient sedated: no  Patient restrained: no Patient cooperative: yes Localization method: ENT speculum, magnification and visualized Removal mechanism: irrigation and curette Complexity: complex 1 objects recovered. Objects recovered: cotton Post-procedure assessment: foreign body removed Patient tolerance: patient tolerated the procedure well with no immediate complications      Results for orders placed or performed in visit on 10/18/23  Hepatitis B DNA, ultraquantitative, PCR   Collection Time: 10/18/23  8:50 AM  Result Value Ref Range   Hepatitis B DNA Not Detected IU/mL   Hepatitis B virus DNA Not Detected Log IU/mL  Hepatitis B Surface AntiGEN   Collection Time: 10/18/23  8:50 AM  Result Value Ref Range   Hepatitis B Surface Ag REACTIVE (A) NON-REACTIVE  AFP tumor marker   Collection Time: 10/18/23  8:50 AM  Result Value Ref Range   AFP-Tumor Marker 2.8 ng/mL       Pertinent labs & imaging results that were available during my care of the patient were reviewed by me and  considered in my medical decision making.  Assessment & Plan:  [place] was seen today for nasal congestion and ear pain.  Diagnoses and all orders for this visit:  Foreign body of right ear, initial encounter -     Foreign Body Removal  Non-recurrent acute suppurative otitis media of right ear without spontaneous rupture of tympanic membrane -     amoxicillin -clavulanate (AUGMENTIN ) 875-125 MG tablet; Take 1 tablet by mouth 2 (two) times daily for 10 days.       Right ear canal irritation Right ear canal irritation with bleeding and foreign body, likely due to attempts to remove wax with a Q-tip. No fever or other systemic symptoms reported. The left ear has some wax but is otherwise unremarkable. - Flush right ear canal to remove Q-Tip  - Prescribe antibiotics to address potential infection due to canal irritation. - Advise against using Q-tips in the ear canal to prevent further irritation and potential injury. - Recommend using baby Q-tips or a warm washcloth to clean the external ear without entering the canal. - Send prescription to [facility]. - Instruct to report worsening symptoms or new developments.          Continue all other maintenance medications.  Follow up plan: Return if symptoms worsen or fail to improve.  The above assessment and management plan was discussed with the patient. The patient verbalized understanding of and has agreed to the management plan. Patient is aware  to call the clinic if they develop any new symptoms or if symptoms persist or worsen. Patient is aware when to return to the clinic for a follow-up visit. Patient educated on when it is appropriate to go to the emergency department.   Rosaline Bruns, FNP-C [facility] [phone]

## 2024-02-07 ENCOUNTER — Ambulatory Visit: Payer: Self-pay

## 2024-02-07 DIAGNOSIS — Z23 Encounter for immunization: Secondary | ICD-10-CM | POA: Diagnosis not present

## 2024-03-17 ENCOUNTER — Other Ambulatory Visit: Payer: Self-pay | Admitting: Family Medicine

## 2024-03-26 ENCOUNTER — Encounter: Payer: Self-pay | Admitting: Gastroenterology

## 2024-04-01 ENCOUNTER — Other Ambulatory Visit (HOSPITAL_COMMUNITY)
Admission: RE | Admit: 2024-04-01 | Discharge: 2024-04-01 | Disposition: A | Discharge status: Home / Self Care | Source: Ambulatory Visit | Attending: Family Medicine | Admitting: Family Medicine

## 2024-04-01 ENCOUNTER — Encounter: Payer: Self-pay | Admitting: Family Medicine

## 2024-04-01 ENCOUNTER — Ambulatory Visit

## 2024-04-01 ENCOUNTER — Ambulatory Visit: Payer: Self-pay | Admitting: Family Medicine

## 2024-04-01 VITALS — BP 113/71 | HR 99 | Ht 64.0 in | Wt 129.0 lb

## 2024-04-01 DIAGNOSIS — Z01419 Encounter for gynecological examination (general) (routine) without abnormal findings: Secondary | ICD-10-CM | POA: Insufficient documentation

## 2024-04-01 DIAGNOSIS — F209 Schizophrenia, unspecified: Secondary | ICD-10-CM

## 2024-04-01 DIAGNOSIS — Z975 Presence of (intrauterine) contraceptive device: Secondary | ICD-10-CM

## 2024-04-01 DIAGNOSIS — E782 Mixed hyperlipidemia: Secondary | ICD-10-CM

## 2024-04-01 LAB — CBC WITH DIFF/PLATELET
Basophils Absolute: 0.1 x10E3/uL (ref 0.0–0.2)
Basos: 1 %
EOS (ABSOLUTE): 0.2 x10E3/uL (ref 0.0–0.4)
Eos: 2 %
Hematocrit: 32.6 % — ABNORMAL LOW (ref 34.0–46.6)
Hemoglobin: 10.5 g/dL — ABNORMAL LOW (ref 11.1–15.9)
Immature Grans (Abs): 0 x10E3/uL (ref 0.0–0.1)
Immature Granulocytes: 0 %
Lymphocytes Absolute: 1.9 x10E3/uL (ref 0.7–3.1)
Lymphs: 15 %
MCH: 26.6 pg (ref 26.6–33.0)
MCHC: 32.2 g/dL (ref 31.5–35.7)
MCV: 83 fL (ref 79–97)
Monocytes Absolute: 0.6 x10E3/uL (ref 0.1–0.9)
Monocytes: 5 %
Neutrophils Absolute: 9.3 x10E3/uL — ABNORMAL HIGH (ref 1.4–7.0)
Neutrophils: 77 %
Platelets: 382 x10E3/uL (ref 150–450)
RBC: 3.94 x10E6/uL (ref 3.77–5.28)
RDW: 12.9 % (ref 11.7–15.4)
WBC: 12.1 x10E3/uL — ABNORMAL HIGH (ref 3.4–10.8)

## 2024-04-01 LAB — CMP14+EGFR
ALT: 10 IU/L (ref 0–32)
AST: 13 IU/L (ref 0–40)
Albumin: 3.3 g/dL — ABNORMAL LOW (ref 3.8–4.9)
Alkaline Phosphatase: 125 IU/L (ref 49–135)
BUN/Creatinine Ratio: 14 (ref 9–23)
BUN: 10 mg/dL (ref 6–24)
Bilirubin Total: <0.2 mg/dL (ref 0.0–1.2)
CO2: 24 mmol/L (ref 20–29)
Calcium: 8.8 mg/dL (ref 8.7–10.2)
Chloride: 105 mmol/L (ref 96–106)
Creatinine, Ser: 0.72 mg/dL (ref 0.57–1.00)
Globulin, Total: 3.4 g/dL (ref 1.5–4.5)
Glucose: 101 mg/dL — ABNORMAL HIGH (ref 70–99)
Potassium: 4 mmol/L (ref 3.5–5.2)
Sodium: 141 mmol/L (ref 134–144)
Total Protein: 6.7 g/dL (ref 6.0–8.5)
eGFR: 101 mL/min/1.73

## 2024-04-01 LAB — LIPID PANEL
Chol/HDL Ratio: 2.6 ratio (ref 0.0–4.4)
Cholesterol, Total: 130 mg/dL (ref 100–199)
HDL: 50 mg/dL
LDL Chol Calc (NIH): 62 mg/dL (ref 0–99)
Triglycerides: 97 mg/dL (ref 0–149)
VLDL Cholesterol Cal: 18 mg/dL (ref 5–40)

## 2024-04-01 LAB — TSH: TSH: 3.3 u[IU]/mL (ref 0.450–4.500)

## 2024-04-01 NOTE — Progress Notes (Signed)
 "  BP 113/71   Pulse 99   Ht 5' 4 (1.626 m)   Wt 129 lb (58.5 kg)   SpO2 98%   BMI 22.14 kg/m    Subjective:   [name], female    DOB: [birth year], 53 y.o.   MRN: [id number]  HPI: Margaret [name] is a 53 y.o. female presenting on 04/01/2024 for Medical Management of Chronic Issues (CPE with pap/breast)   Discussed the use of AI scribe software for clinical note transcription with the patient, who gave verbal consent to proceed.  History of Present Illness          Physical exam and Pap smear woman exam  Patient denies any chest pain, shortness of breath, headaches or vision issues, abdominal complaints, diarrhea, nausea, vomiting, or joint issues.   Patient has had a little bit of upper respiratory congestion and cough going on over the past couple weeks no fevers chills body aches and otherwise been acting normally.  Informed to get her checked out because she has had pneumonia in the past.  She is not losing weight and otherwise seems to be acting normally.  Hyperlipidemia Patient is coming in for recheck of his hyperlipidemia. The patient is currently taking no medicine currently, have been diet controlled and we will monitor. They deny any issues with myalgias or history of liver damage from it. They deny any focal numbness or weakness or chest pain.    Relevant past medical, surgical, family and social history reviewed and updated as indicated. Interim medical history since our last visit reviewed. Allergies and medications reviewed and updated.  Review of Systems  Constitutional:  Negative for chills and fever.  HENT:  Negative for congestion, ear discharge, ear pain and tinnitus.   Eyes:  Negative for pain, redness and visual disturbance.  Respiratory:  Negative for cough, chest tightness, shortness of breath and wheezing.   Cardiovascular:  Negative for chest pain, palpitations and leg swelling.  Gastrointestinal:  Negative for abdominal pain, blood in  stool, constipation and diarrhea.  Genitourinary:  Negative for difficulty urinating, dysuria, hematuria, menstrual problem and pelvic pain.  Musculoskeletal:  Negative for back pain, gait problem and myalgias.  Skin:  Negative for rash.  Neurological:  Negative for dizziness, weakness, light-headedness and headaches.  Psychiatric/Behavioral:  Negative for agitation, behavioral problems and suicidal ideas.   All other systems reviewed and are negative.   Per HPI unless specifically indicated above   Allergies as of 04/01/2024       Reactions   Lactose Nausea And Vomiting   GI upset   Olanzapine    Other reaction(s): UNKNOWN   Tuberculin, Ppd    Other reaction(s): UNKNOWN   Milk (cow) Nausea And Vomiting   Other reaction(s): GI upset        Medication List        Accurate as of April 01, 2024 10:38 AM. If you have any questions, ask your nurse or doctor.          Amantadine  HCl 100 MG tablet Take 100 mg by mouth every morning. The timing of this medication is very important.   acetaminophen  500 MG tablet Commonly known as: TYLENOL  Take 1 tablet (500 mg total) by mouth every 6 (six) hours as needed for moderate pain, fever or headache.   albuterol  (2.5 MG/3ML) 0.083% nebulizer solution Commonly known as: PROVENTIL  Take 3 mLs (2.5 mg total) by nebulization every 6 (six) hours as needed for wheezing or shortness of breath.  Allergy Relief 10 MG tablet Generic drug: loratadine  TAKE 1 TABLET BY MOUTH ONCE DAILY.   clozapine  200 MG tablet Commonly known as: CLOZARIL  TAKE 2 TABLETS BY MOUTH AT BEDTIME.   entecavir  0.5 MG tablet Commonly known as: BARACLUDE  TAKE (1) TABLET BY MOUTH ONCE DAILY.   feeding supplement Liqd Take 1 Container by mouth 3 (three) times daily between meals. 237 mls Tid between meals.   Fish Oil  1200 MG Caps Take 1 capsule (1,200 mg total) by mouth 2 (two) times daily.   fluticasone  50 MCG/ACT nasal spray Commonly known as:  FLONASE  SPRAY 2 SPRAYS IN RIGHT NOSTRIL ONLY TWICE DAILY.   hydrOXYzine  25 MG capsule Commonly known as: VISTARIL  TAKE 1 CAPSULE BY MOUTH EVERY 8 HOURS AS NEEDED FOR ANXIETY   hydrOXYzine  25 MG tablet Commonly known as: ATARAX  Take 25 mg by mouth daily.   levonorgestrel  20 MCG/24HR IUD Commonly known as: MIRENA  1 each by Intrauterine route once.   Minerin Creme Crea Apply 1 Application topically See admin instructions. Apply moderate amount topically to dry skin on soles and heels of both feet after shower   mirtazapine  15 MG tablet Commonly known as: REMERON  Take 15 mg by mouth at bedtime.   Mucus Relief 600 MG 12 hr tablet Generic drug: guaiFENesin  TAKE 2 TABLETS BY MOUTH TWICE DAILY.   polyethylene glycol powder 17 GM/SCOOP powder Commonly known as: GoodSense ClearLax Take 127.5 g by mouth 2 (two) times daily. What changed:  how much to take additional instructions   PROzac  40 MG capsule Generic drug: FLUoxetine  Take by mouth.   Spiriva  Respimat 1.25 MCG/ACT Aers Generic drug: Tiotropium Bromide USE 2 PUFFS BY MOUTH ONCE DAILY   Sterile Gauze 3X3 Pads 2 packets by Does not apply route daily.   Symbicort  160-4.5 MCG/ACT inhaler Generic drug: budesonide -formoterol  INHALE 2 PUFFS INTO THE LUNGS TWICE A DAY   Vitamin D3 50 MCG (2000 UT) capsule TAKE 1 CAPSULE BY MOUTH EVERY DAY   vitamin E  200 UNIT capsule TAKE 1 CAPSULE BY MOUTH ONCE A DAY.         Objective:   BP 113/71   Pulse 99   Ht 5' 4 (1.626 m)   Wt 129 lb (58.5 kg)   SpO2 98%   BMI 22.14 kg/m   Wt Readings from Last 3 Encounters:  04/01/24 129 lb (58.5 kg)  01/23/24 133 lb 12.8 oz (60.7 kg)  12/28/23 123 lb (55.8 kg)    Physical Exam Vitals and nursing note reviewed.  Constitutional:      General: She is not in acute distress.    Appearance: Normal appearance. She is well-developed. She is not diaphoretic.  Eyes:     Conjunctiva/sclera: Conjunctivae normal.     Pupils: Pupils  are equal, round, and reactive to light.  Neck:     Thyroid : No thyromegaly.  Cardiovascular:     Rate and Rhythm: Normal rate and regular rhythm.     Heart sounds: Normal heart sounds. No murmur heard. Pulmonary:     Effort: Pulmonary effort is normal. No respiratory distress.     Breath sounds: Normal breath sounds. No wheezing.  Chest:  Breasts:    Breasts are symmetrical.     Right: No inverted nipple, mass, nipple discharge, skin change or tenderness.     Left: No inverted nipple, mass, nipple discharge, skin change or tenderness.  Abdominal:     General: Abdomen is flat. Bowel sounds are normal. There is no distension.  Palpations: Abdomen is soft.     Tenderness: There is no abdominal tenderness. There is no guarding or rebound.  Genitourinary:    Exam position: Supine.     Labia:        Right: No rash or lesion.        Left: No rash or lesion.      Vagina: Normal.     Cervix: No cervical motion tenderness, discharge or friability.     Uterus: Not deviated, not enlarged, not fixed and not tender.      Adnexa:        Right: No mass or tenderness.         Left: No mass or tenderness.    Musculoskeletal:        General: No tenderness. Normal range of motion.     Cervical back: Neck supple.  Lymphadenopathy:     Cervical: No cervical adenopathy.  Skin:    General: Skin is warm and dry.     Findings: No rash.  Neurological:     General: No focal deficit present.     Mental Status: She is alert and oriented to person, place, and time.     Coordination: Coordination normal.  Psychiatric:        Behavior: Behavior normal.                 Assessment & Plan:   Problem List Items Addressed This Visit       Other   Hyperlipidemia   Relevant Orders   Lipid panel   Schizophrenia (HCC)   Relevant Orders   Cytology - PAP([place])   TSH   IUD (intrauterine device) in place   Relevant Orders   DG Pelvis 1-2 Views   Other Visit Diagnoses       Well  woman exam with routine gynecological exam    -  Primary   Relevant Orders   Cytology - PAP([place])   CBC With Diff/Platelet   CMP14+EGFR   TSH   DG Pelvis 1-2 Views            Patient seems to be doing well for the most part, sent out for Pap smear will do blood work today as well rechecking cholesterol.  She  She does still see psychiatry for schizophrenia and has monitored that well.  She does still wear depends for urinary incontinence.  She supposedly had an IUD in the past but her current caretaker does not know for sure.  On physical exam unable to visualize strings, will do a quick x-ray of the pelvis.  Per our records the IUD was supposedly placed in 2016 but there is added on it in 2023 so it possibly was replaced in 2023.     Pelvic x-ray showed IUD still in place and appears to be in the right location of the uterus.  She is not having menstrual cycles so she is either gone through menopause or is still controlling her cycles.  I would leave it in place for another year or 2 and if she still does not have any menstrual cycles then we can try and hold at that point.    Follow up plan: Return in about 6 months (around 09/30/2024), or if symptoms worsen or fail to improve, for Cholesterol recheck.  Counseling provided for all of the vaccine components Orders Placed This Encounter  Procedures   DG Pelvis 1-2 Views   CBC With Diff/Platelet   CMP14+EGFR   Lipid  panel   TSH    Fonda Levins, MD [facility] 04/01/2024, 10:38 AM     "

## 2024-04-02 LAB — CYTOLOGY - PAP: Diagnosis: NEGATIVE

## 2024-04-05 ENCOUNTER — Ambulatory Visit: Payer: Self-pay | Admitting: Family Medicine

## 2024-04-09 ENCOUNTER — Other Ambulatory Visit: Payer: Self-pay

## 2024-04-09 DIAGNOSIS — D649 Anemia, unspecified: Secondary | ICD-10-CM

## 2024-04-17 ENCOUNTER — Other Ambulatory Visit

## 2024-04-17 DIAGNOSIS — D649 Anemia, unspecified: Secondary | ICD-10-CM

## 2024-04-17 LAB — CBC WITH DIFFERENTIAL/PLATELET
Basophils Absolute: 0.1 x10E3/uL (ref 0.0–0.2)
Basos: 1 %
EOS (ABSOLUTE): 0.2 x10E3/uL (ref 0.0–0.4)
Eos: 2 %
Hematocrit: 35.8 % (ref 34.0–46.6)
Hemoglobin: 11.1 g/dL (ref 11.1–15.9)
Immature Grans (Abs): 0 x10E3/uL (ref 0.0–0.1)
Immature Granulocytes: 0 %
Lymphocytes Absolute: 2.3 x10E3/uL (ref 0.7–3.1)
Lymphs: 20 %
MCH: 26.6 pg (ref 26.6–33.0)
MCHC: 31 g/dL — ABNORMAL LOW (ref 31.5–35.7)
MCV: 86 fL (ref 79–97)
Monocytes Absolute: 0.6 x10E3/uL (ref 0.1–0.9)
Monocytes: 5 %
Neutrophils Absolute: 8.1 x10E3/uL — ABNORMAL HIGH (ref 1.4–7.0)
Neutrophils: 72 %
Platelets: 327 x10E3/uL (ref 150–450)
RBC: 4.18 x10E6/uL (ref 3.77–5.28)
RDW: 14.2 % (ref 11.7–15.4)
WBC: 11.3 x10E3/uL — ABNORMAL HIGH (ref 3.4–10.8)

## 2024-04-24 ENCOUNTER — Ambulatory Visit: Payer: Self-pay | Admitting: Family Medicine

## 2024-05-15 ENCOUNTER — Ambulatory Visit: Payer: Self-pay | Admitting: Family Medicine

## 2024-09-30 ENCOUNTER — Ambulatory Visit: Admitting: Family Medicine
